# Patient Record
Sex: Male | Born: 1970 | Race: White | Hispanic: No | Marital: Single | Smoking: Never smoker
Health system: Southern US, Community
[De-identification: ages and names within clinical notes are randomized; demographics above are authoritative.]

## PROBLEM LIST (undated history)

## (undated) DIAGNOSIS — G4730 Sleep apnea, unspecified: Secondary | ICD-10-CM

## (undated) DIAGNOSIS — K219 Gastro-esophageal reflux disease without esophagitis: Secondary | ICD-10-CM

## (undated) DIAGNOSIS — K760 Fatty (change of) liver, not elsewhere classified: Secondary | ICD-10-CM

## (undated) DIAGNOSIS — I639 Cerebral infarction, unspecified: Secondary | ICD-10-CM

## (undated) DIAGNOSIS — F329 Major depressive disorder, single episode, unspecified: Secondary | ICD-10-CM

## (undated) DIAGNOSIS — M25559 Pain in unspecified hip: Secondary | ICD-10-CM

## (undated) DIAGNOSIS — M545 Low back pain, unspecified: Secondary | ICD-10-CM

## (undated) DIAGNOSIS — K635 Polyp of colon: Secondary | ICD-10-CM

## (undated) DIAGNOSIS — N183 Chronic kidney disease, stage 3 (moderate): Secondary | ICD-10-CM

## (undated) DIAGNOSIS — H409 Unspecified glaucoma: Secondary | ICD-10-CM

## (undated) DIAGNOSIS — Z9889 Other specified postprocedural states: Secondary | ICD-10-CM

## (undated) DIAGNOSIS — I499 Cardiac arrhythmia, unspecified: Secondary | ICD-10-CM

## (undated) DIAGNOSIS — F32A Depression, unspecified: Secondary | ICD-10-CM

## (undated) DIAGNOSIS — M2550 Pain in unspecified joint: Secondary | ICD-10-CM

## (undated) DIAGNOSIS — G8929 Other chronic pain: Secondary | ICD-10-CM

## (undated) DIAGNOSIS — Z5189 Encounter for other specified aftercare: Secondary | ICD-10-CM

## (undated) DIAGNOSIS — G709 Myoneural disorder, unspecified: Secondary | ICD-10-CM

## (undated) DIAGNOSIS — D649 Anemia, unspecified: Secondary | ICD-10-CM

## (undated) DIAGNOSIS — R7303 Prediabetes: Secondary | ICD-10-CM

## (undated) DIAGNOSIS — M1990 Unspecified osteoarthritis, unspecified site: Secondary | ICD-10-CM

## (undated) DIAGNOSIS — R112 Nausea with vomiting, unspecified: Secondary | ICD-10-CM

## (undated) DIAGNOSIS — I10 Essential (primary) hypertension: Secondary | ICD-10-CM

## (undated) DIAGNOSIS — Z8739 Personal history of other diseases of the musculoskeletal system and connective tissue: Secondary | ICD-10-CM

## (undated) DIAGNOSIS — R011 Cardiac murmur, unspecified: Secondary | ICD-10-CM

## (undated) DIAGNOSIS — E785 Hyperlipidemia, unspecified: Secondary | ICD-10-CM

## (undated) HISTORY — DX: Chronic kidney disease, stage 3 (moderate): N18.3

## (undated) HISTORY — PX: HERNIA REPAIR: SHX51

## (undated) HISTORY — DX: Depression, unspecified: F32.A

## (undated) HISTORY — DX: Encounter for other specified aftercare: Z51.89

## (undated) HISTORY — DX: Essential (primary) hypertension: I10

## (undated) HISTORY — PX: ESOPHAGOGASTRODUODENOSCOPY: SHX1529

## (undated) HISTORY — PX: CARDIOVERSION: SHX1299

## (undated) HISTORY — DX: Unspecified glaucoma: H40.9

## (undated) HISTORY — DX: Myoneural disorder, unspecified: G70.9

## (undated) HISTORY — DX: Major depressive disorder, single episode, unspecified: F32.9

## (undated) HISTORY — DX: Anemia, unspecified: D64.9

## (undated) HISTORY — DX: Prediabetes: R73.03

## (undated) HISTORY — DX: Cardiac murmur, unspecified: R01.1

## (undated) HISTORY — DX: Gastro-esophageal reflux disease without esophagitis: K21.9

## (undated) HISTORY — PX: TONSILLECTOMY: SUR1361

## (undated) HISTORY — PX: JOINT REPLACEMENT: SHX530

## (undated) HISTORY — DX: Cerebral infarction, unspecified: I63.9

## (undated) HISTORY — PX: COSMETIC SURGERY: SHX468

## (undated) HISTORY — DX: Pain in unspecified hip: M25.559

## (undated) HISTORY — DX: Polyp of colon: K63.5

## (undated) HISTORY — PX: LIPOSUCTION: SHX10

## (undated) HISTORY — DX: Hyperlipidemia, unspecified: E78.5

## (undated) HISTORY — DX: Unspecified osteoarthritis, unspecified site: M19.90

## (undated) HISTORY — DX: Pain in unspecified joint: M25.50

## (undated) SURGERY — PANNICULECTOMY
Anesthesia: General | Laterality: Bilateral

---

## 1997-10-10 ENCOUNTER — Emergency Department (HOSPITAL_COMMUNITY): Admission: EM | Admit: 1997-10-10 | Discharge: 1997-10-10 | Payer: Self-pay | Admitting: Emergency Medicine

## 1998-02-19 ENCOUNTER — Encounter: Admission: RE | Admit: 1998-02-19 | Discharge: 1998-05-20 | Payer: Self-pay | Admitting: Internal Medicine

## 1998-07-08 ENCOUNTER — Encounter: Admission: RE | Admit: 1998-07-08 | Discharge: 1998-10-06 | Payer: Self-pay | Admitting: Internal Medicine

## 1998-10-07 ENCOUNTER — Encounter: Admission: RE | Admit: 1998-10-07 | Discharge: 1999-01-05 | Payer: Self-pay | Admitting: Internal Medicine

## 2000-09-07 ENCOUNTER — Emergency Department (HOSPITAL_COMMUNITY): Admission: EM | Admit: 2000-09-07 | Discharge: 2000-09-07 | Payer: Self-pay | Admitting: *Deleted

## 2000-09-07 ENCOUNTER — Encounter: Payer: Self-pay | Admitting: *Deleted

## 2004-02-18 ENCOUNTER — Inpatient Hospital Stay (HOSPITAL_COMMUNITY): Admission: AD | Admit: 2004-02-18 | Discharge: 2004-02-25 | Payer: Self-pay | Admitting: Family Medicine

## 2004-05-25 DIAGNOSIS — I639 Cerebral infarction, unspecified: Secondary | ICD-10-CM

## 2004-05-25 HISTORY — DX: Cerebral infarction, unspecified: I63.9

## 2006-07-28 ENCOUNTER — Ambulatory Visit (HOSPITAL_COMMUNITY): Admission: RE | Admit: 2006-07-28 | Discharge: 2006-07-28 | Payer: Self-pay | Admitting: Family Medicine

## 2006-08-29 ENCOUNTER — Inpatient Hospital Stay (HOSPITAL_COMMUNITY): Admission: EM | Admit: 2006-08-29 | Discharge: 2006-09-02 | Payer: Self-pay | Admitting: Emergency Medicine

## 2006-08-30 ENCOUNTER — Ambulatory Visit: Payer: Self-pay | Admitting: Internal Medicine

## 2006-09-08 ENCOUNTER — Encounter: Admission: RE | Admit: 2006-09-08 | Discharge: 2006-09-08 | Payer: Self-pay | Admitting: Urology

## 2006-11-19 ENCOUNTER — Ambulatory Visit: Payer: Self-pay | Admitting: Gastroenterology

## 2007-02-23 ENCOUNTER — Ambulatory Visit (HOSPITAL_COMMUNITY): Admission: RE | Admit: 2007-02-23 | Discharge: 2007-02-23 | Payer: Self-pay | Admitting: Surgery

## 2007-02-23 ENCOUNTER — Ambulatory Visit: Payer: Self-pay | Admitting: Gastroenterology

## 2007-03-04 ENCOUNTER — Ambulatory Visit (HOSPITAL_COMMUNITY): Admission: RE | Admit: 2007-03-04 | Discharge: 2007-03-04 | Payer: Self-pay | Admitting: Gastroenterology

## 2007-03-04 ENCOUNTER — Ambulatory Visit: Payer: Self-pay | Admitting: Gastroenterology

## 2007-03-04 ENCOUNTER — Encounter: Payer: Self-pay | Admitting: Gastroenterology

## 2007-04-13 ENCOUNTER — Ambulatory Visit: Payer: Self-pay | Admitting: Gastroenterology

## 2007-04-27 ENCOUNTER — Ambulatory Visit: Payer: Self-pay | Admitting: Oncology

## 2007-07-04 ENCOUNTER — Ambulatory Visit: Payer: Self-pay | Admitting: Oncology

## 2007-07-28 ENCOUNTER — Ambulatory Visit: Payer: Self-pay | Admitting: Gastroenterology

## 2008-05-25 HISTORY — PX: BARIATRIC SURGERY: SHX1103

## 2008-08-03 DIAGNOSIS — T7840XA Allergy, unspecified, initial encounter: Secondary | ICD-10-CM | POA: Insufficient documentation

## 2008-08-03 DIAGNOSIS — I6350 Cerebral infarction due to unspecified occlusion or stenosis of unspecified cerebral artery: Secondary | ICD-10-CM

## 2008-08-03 DIAGNOSIS — G4730 Sleep apnea, unspecified: Secondary | ICD-10-CM | POA: Insufficient documentation

## 2008-08-03 DIAGNOSIS — I10 Essential (primary) hypertension: Secondary | ICD-10-CM | POA: Insufficient documentation

## 2008-08-03 DIAGNOSIS — E785 Hyperlipidemia, unspecified: Secondary | ICD-10-CM

## 2008-08-03 DIAGNOSIS — D126 Benign neoplasm of colon, unspecified: Secondary | ICD-10-CM

## 2008-08-03 DIAGNOSIS — E119 Type 2 diabetes mellitus without complications: Secondary | ICD-10-CM

## 2008-08-03 DIAGNOSIS — K449 Diaphragmatic hernia without obstruction or gangrene: Secondary | ICD-10-CM | POA: Insufficient documentation

## 2008-08-03 DIAGNOSIS — D649 Anemia, unspecified: Secondary | ICD-10-CM | POA: Insufficient documentation

## 2008-08-03 HISTORY — DX: Diaphragmatic hernia without obstruction or gangrene: K44.9

## 2008-08-03 HISTORY — DX: Allergy, unspecified, initial encounter: T78.40XA

## 2008-08-03 HISTORY — DX: Benign neoplasm of colon, unspecified: D12.6

## 2008-08-03 HISTORY — DX: Cerebral infarction due to unspecified occlusion or stenosis of unspecified cerebral artery: I63.50

## 2008-08-30 ENCOUNTER — Ambulatory Visit (HOSPITAL_COMMUNITY): Admission: RE | Admit: 2008-08-30 | Discharge: 2008-08-30 | Payer: Self-pay | Admitting: Family Medicine

## 2010-01-09 ENCOUNTER — Ambulatory Visit (HOSPITAL_COMMUNITY): Admission: RE | Admit: 2010-01-09 | Discharge: 2010-01-09 | Payer: Self-pay | Admitting: Family Medicine

## 2010-03-12 ENCOUNTER — Encounter (INDEPENDENT_AMBULATORY_CARE_PROVIDER_SITE_OTHER): Payer: Self-pay | Admitting: *Deleted

## 2010-06-15 ENCOUNTER — Encounter: Payer: Self-pay | Admitting: Family Medicine

## 2010-06-15 ENCOUNTER — Encounter: Payer: Self-pay | Admitting: Internal Medicine

## 2010-06-26 NOTE — Letter (Signed)
Summary: Recall Colonoscopy/Endoscopy, Change to Office Visit  [facility]  [address]   [place], [place] [address]   Phone: [phone]  Fax: [phone]      March 12, 2010   LYNX GOODRICH [address] [place], [place]  [address] March 13, 1971   Dear Mr. Knarr,   According to our records, it is time for you to schedule a Colonoscopy/Endoscopy. However, after reviewing your medical record, we recommend an office visit in order to determine your need for a repeat procedure.  Please call [phone] at your convenience to schedule an office visit. If you have any questions or concerns, please feel free to contact our office.   Sincerely,   Ave Filter  [facility] Ph: [phone]   Fax: [phone]

## 2010-10-07 NOTE — Consult Note (Signed)
[name], [name]                ACCOUNT NO.:  [id number]   MEDICAL RECORD NO.:  [id number]          PATIENT TYPE:  AMB   LOCATION:  DAY                           FACILITY:  [facility]   PHYSICIAN:  Kassie Mends, M.D.      DATE OF BIRTH:  1970/09/10   DATE OF CONSULTATION:  02/23/2007  DATE OF DISCHARGE:                                 CONSULTATION   PROBLEM LIST:  1. Normocytic anemia.  2. Hypertension.  3. Hyperlipidemia.  4. Stroke in April of 2008.  5. Diabetes.  6. Sleep apnea.  7. Morbid obesity.  8. Allergies.   SUBJECTIVE:  [name] is a 40 year old male who was initially seen in  June of 2008 because his hemoglobin was 12.3 in March of 2008.  He had  heme-negative stool at a time.  He needed an evaluation for his anemia  but had sleep apnea which had not been adequately managed.  He denied  any active bleeding at the time.  He presents today, and his CPAP has  been titrated.  He has a full face mask.  He is scheduled for obesity  surgery approximate 3 months from now.  He denies any bleeding or black  tarry stools.  Iron makes his stools green.  He has been on a diet for 1  month and has to lose 35 pounds.   MEDICATIONS:  1. Lisinopril 20 mg daily.  2. Percocet.  3  Iron.  1. Plendil.  2. NPH 25 units every morning.  3. Metoprolol.  4. HCTZ.  5. Zocor.  6. Benicar.  7. Aspirin 81 mg daily.  8. Spirolactone 25 mg b.i.d.  9. Lantus 25 units at night.  10.Gabapentin.  11.Metformin 1 gram twice daily.  12.Actos 45 mg daily.  13.Multivitamin.   OBJECTIVE:  VITAL SIGNS:  Weight reportedly 470 pounds.  Height 5 feet 7  inches.  Temperature 97.9, blood pressure 120/70, pulse 64.  GENERAL:  He is in no apparent distress, alert and oriented x4.  LUNGS:  Clear to auscultation bilaterally.  CARDIOVASCULAR:  Regular rhythm, no murmur, normal S1-S2.  ABDOMEN:  Bowel sounds present, soft, nontender, nondistended.  No  rebound or guarding.  Obese.   LABORATORY DATA:   From July of 2008:  Hemoglobin 13.9, platelets 230,  ferritin 26.   ASSESSMENT:  [name] is a 40 year old male with a normocytic anemia.  His anemia seems to have responded to oral iron therapy.  He may have  early iron-deficiency anemia.  He was also noted to have a creatinine  1.49 in March of 2008.   Thank you for allowing me to see [name] in consultation.  My  recommendations follow.   RECOMMENDATIONS:  1. Will obtain recent labs drawn in Dr. Samul Dada office.  2. He will be scheduled for a colonoscopy followed by an EGD to      evaluate his anemia.  He is asked to bring his BiPAP machine with      him.  We will need to connect it to oxygen for his sedation because  he is on room air at home.  He will be given the OsmoPrep.  I did      stress the need for drinking plenty of fluids.  He should drink      plenty of fluids to keep his urine light yellow.  3. He may follow up as needed with me.      Kassie Mends, M.D.  Electronically Signed     SM/MEDQ  D:  02/23/2007  T:  02/24/2007  Job:  [id number]   cc:   Jeoffrey Massed, MD  Fax: [phone]

## 2010-10-07 NOTE — Assessment & Plan Note (Signed)
[name], CODNER                 CHART#:  [id number]   DATE:  04/13/2007                       DOB:  06-03-1970   CHIEF COMPLAINT:  Follow up multiple colonic adenomatous polyps on  recent colonoscopy/normocytic anemia/gastritis.   SUBJECTIVE:  The patient is a 40 year old morbidly obese Caucasian male.  He underwent EGD and colonoscopy by Dr. Cira Servant on 03/04/07 with history  of normocytic anemia and low ferritin with Hemoccult-negative stool.  He  was found to have 7 colonic polyps which were removed from his colon  which were tubular adenomas.  He had chronic gastritis with H. pylori  seen on biopsy.  He had a 1 cm hiatal hernia.  He is status post H.  pylori treatment and he has done well.  He was treated with Flagyl,  Prevacid, and amoxicillin.  He is being seen by the [facility].  He has had 25-pound weight loss thus far.  He is  preparing for gastric bypass.  He denies any abdominal pain at this  time, denies any nausea or vomiting.  He does occasionally have loose  stools with urgency and denies any rectal bleeding or melena.  Overall,  he feels well.   CURRENT MEDICATIONS:  See list from 04/13/07.   ALLERGIES:  No known drug allergies.   OBJECTIVE:  VITAL SIGNS:  Weight stated 455 pounds, height 67 inches,  temp 97.3, blood pressure 128/78, pulse 64.  GENERAL:  He is a morbidly obese Caucasian male who is alert, oriented,  pleasant, cooperative.  No acute distress.  HEENT:  Sclerae clear, nonicteric.  Conjunctivae pink, clear.  Oropharynx pink and moist without any lesions.  CHEST/HEART:  Regular rate and rhythm.  Normal S1, S2.  ABDOMEN:  Protuberant.  Massively obese with positive bowel sounds x4.  No bruits auscultated.  Soft, nontender, nondistended, without palpable  mass or hepatosplenomegaly.  Exam is extremely limited given patient's  body habitus.  EXTREMITIES:  With trace lower extremity pretibial edema bilaterally.   ASSESSMENT:  1. Multiple adenomatous polyps removed on recent colonoscopy (7):  He      is going to need further evaluation for genetic testing for      familial adenomatous polyposis.  2. Morbid obesity.  He is being seen by the bariatric clinic in      [place], [place].  3. Helicobacter pylori gastritis status post triple drug therapy.  4. Normocytic anemia most related to significant polyposis.   PLAN:  1. Next colonoscopy October 2011 or sooner if he has problems.  2. Check hemoglobin and ferritin.  If this is normal, he can      discontinue iron.  3. Continue Prevacid 30 mg daily.  4. He is reminded that his first-degree relatives are going to need a      colonoscopy at age 15 and every 5 years.  5. OPV with Dr. Cira Servant in 6 months.       Lorenza Burton, N.P.  Electronically Signed     Kassie Mends, M.D.  Electronically Signed    KJ/MEDQ  D:  04/14/2007  T:  04/15/2007  Job:  [id number]   cc:   Jeoffrey Massed, MD

## 2010-10-07 NOTE — Assessment & Plan Note (Signed)
[name], [name]                 CHART#:  [id number]   DATE:  07/28/2007                       DOB:  1971/01/18   DATE OF VISIT:  07/28/2007   REFERRING PHYSICIAN:  Jeoffrey Massed, MEDICATIONS   PROBLEM LIST:  1. Colonoscopy in October of 2008, which revealed multiple adenomatous      polyps without high grade dysplasia.  2. Normocytic anemia with biopsies of the duodenum negative for celiac      sprue.  3. Morbid obesity.  4. History of H. pylori gastritis.  5. Hiatal hernia.  6. Hypertension.  7. Hyperlipidemia.  8. History of CVA.  9. Diabetes.  10.Sleep apnea.  11.Allergies.   SUBJECTIVE:  The patient is a 40 year old male who presents as a return  patient visit.  He did see the genetic counselor, but the test that he  needs to confirm familial adenomatous polyposis costs $4000 and Medicaid  is not willing to pay for it.  He does not have $4000 to pay for the  test.  He denies any blood in his stool.  His strict protein diet made  him have loose stool.  He is still undergoing evaluation at the  Bariatric Clinic.   OBJECTIVE:  Height 5 feet 7 inches.  Temperature 97.6, blood pressure  122/70, pulse 64.   ASSESSMENT:  The patient is a 40 year old male who had normocytic anemia  and was found to have multiple polyps on colonoscopy in October of 2008.  His last hemoglobin that we have on record is November of 2008, and was  13.6.  He is no longer on iron.  Thank you for allowing me to see this  patient in consultation.  My recommendations follow.   RECOMMENDATIONS:  1. We will obtain labs drawn from Dr. Samul Dada office in 2009.  He      should have a recent hemoglobin and hematocrit.  2. He has a follow up appointment to see me in 12 months.  3. Screening colonoscopy in three years unless the tests for familial      adenomatous polyposis      confirms the diagnosis.  His first degree relatives should have a      screening colonoscopy at age 42 and then every 5  years.       Kassie Mends, M.D.  Electronically Signed     SM/MEDQ  D:  07/28/2007  T:  07/28/2007  Job:  [id number]   cc:   Jeoffrey Massed, MD

## 2010-10-07 NOTE — Assessment & Plan Note (Signed)
[name], [name]                 CHART#:  [id number]   DATE:  11/19/2006                       DOB:  [birth year]   REFERRING PHYSICIAN:  Nicoletta Ba.   REASON FOR CONSULTATION:  Anemia.   HPI:  [name] is a 40 year old male who reports no evidence of overt  bleeding.  He was sent by [name] because a CBC was drawn in August  of 2007, and his hemoglobin was 12.3.  He had 3 Hemoccult cards drawn  and they were negative in October of 2007.  His CBC was drawn again in  March of 2008, and his hemoglobin, again, was noted to be 12.2.  His MCV  was 83.2.  He has a BUN of 33 and a creatinine of 1.49 associated with  his CBC.  The iron, TIBC, and the ferritin that were drawn along with  this are not available to me today.  The patient has a significant  medical history of being morbidly obese.  He is currently 470 pounds.  He has been as heavy as 573 pounds. He denies any blood in his stool or  vomiting of blood.  He is taking iron pills so his stools are black, but  he has not had black stool that looks like tar.  He does not really have  a bowel movement every day.  He states does not eat meat every day but  he eats meat.  He denies any problems swallowing, heartburn, vomiting,  or abdominal pain.  He does not have any problems with diarrhea or blood  in his urine.  He states his weight has gone up and gone down.  He  denies the use of any aspirins, BCs, or Goody powders or other  antiinflammatory drugs.  He sometimes uses Tylenol or Percocet for  musculoskeletal pain.   PAST MEDICAL HISTORY:  1. Hypertension.  2. Hyperlipidemia.  3. Stroke in April of 2008.  4. Diabetes.  5. Sleep apnea but he has no machine.  6. Allergies.   PAST SURGICAL HISTORY:  None.   ALLERGIES:  No known drug allergies.   MEDICATIONS:  Obtained from Dr. Samul Dada note of May 2008.  1. Lisinopril 20 mg tablets daily.  2. Percocet as needed.  3. Iron 325 mg twice daily.  4. Plendil 10 mg  daily.  5. NPH 30 units q.a.m.  6. Metoprolol 150 mg twice daily.  7. Hydrochlorothiazide 25 mg daily.  8. Zocor 40 mg daily.  9. Benicar 40 mg daily.  10.Aspirin 81 mg daily.  11.Aldactone 25 mg b.i.d.  12.Lantus 30 units nightly.  13.Neurontin 300 mg nightly.  14.Metformin 1 g twice daily.  15.Actos 45 mg daily.   FAMILY HISTORY:  He has no family history of colon cancer or colon  polyps.   SOCIAL HISTORY:  He lives with his grandmother for the past 8 years.  He  is single.  He is disabled.  He denies any tobacco or alcohol use.   REVIEW OF SYSTEMS:  As per the HPI, otherwise all systems are negative.   PHYSICAL EXAMINATION:  VITAL SIGNS:  He is 470 pounds, height 5 feet 7  inches, BMI 73.6 (morbidly obese), temperature 98, blood pressure  142/80, pulse 72.  GENERAL:  He is in no apparent distress.  Alert and oriented x4.  HEENT  EXAM:  Atraumatic, normocephalic.  Pupils equal and reactive to light.  MOUTH:  No oral lesions.  Posterior pharynx without erythema or  exudate.NECK:  Full range of motion and no lymphadenopathy.  LUNGS:  Clear to auscultation bilaterally.CARDIOVASCULAR EXAM:  Regular  rhythm, no murmur, normal S1 and S2.  ABDOMEN:  Bowel sounds are present, soft, nontender, nondistended.  No  rebound or guarding.  Unable to assess for hepatosplenomegaly due to the  size of his abdomen.EXTREMITIES:  Have 2+ edema bilaterally.  RECTAL  EXAM:  Unable to palpate any masses.  Prostate within normal limits, no  masses.  Stool heme negative and soft in the vault.   LABORATORY DATA:  ALT 13, AST 15, alk phos 86, total bili 0.7, albumin  3.3, calcium 8.3.  Hemoglobin A1c 6.9 in January of 2008.   ASSESSMENT:  [name] is a 40 year old male with morbid obesity and  normocytic anemia with heme-negative stools.  He also has sleep apnea  and it has not been fully evaluated and appropriately managed.  Anemia  in a young male warrants endoscopic evaluation.  However, the  2  comorbidities that make the patient's endoscopy high risk include his  morbid obesity and his sleep apnea.   Thank you for allowing me to see the patient in consultation.  My  recommendations follow.   RECOMMENDATIONS:  1. I spent 20 minutes talking with the patient about what the next      best step would be for him, and I outlined a plan.  The first step      should be for him to attend his obesity seminar to consider      bariatric surgery.  He would certainly need an endoscopic      evaluation prior to having bariatric surgery, and the facility that      would perform this surgery would be better equipped to perform a      colonoscopy.  The next step, following his attendance of the      seminar, should be for him to see to Dr. Gerilyn Pilgrim so that he can      obtain his sleep apnea machine so that he can have appropriate      settings, and when he does receive sedation, his airway can be      appropriately managed from an oxygenation and a ventilation      standpoint.  Once these 2 steps are complete, then we can make the      decision as to where he receives his colonoscopy.  My first      inclination is that the patient would be best served having his      colonoscopy at a tertiary care center where, should he have      problems with hypoventilation and need to be intubated, he is at an      institution in which he has the highest level of care of anesthesia      available.  I will discuss the benefits versus the risk of      conscious sedation versus propofol with my colleagues at [facility] in a patient who has      the patient's comorbidities and discuss this with the patient.  2. Will check a CBC and a ferritin.  3. He has a return patient visit to see me in 2 months.       Kassie Mends, M.D.  Electronically Signed     SM/MEDQ  D:  11/19/2006  T:  11/20/2006  Job:  [id number]   cc:   Jeoffrey Massed, MD

## 2010-10-07 NOTE — Op Note (Signed)
NAMEZEB, [name]                ACCOUNT NO.:  [id number]   MEDICAL RECORD NO.:  [id number]          PATIENT TYPE:  AMB   LOCATION:  DAY                           FACILITY:  [facility]   PHYSICIAN:  Kassie Mends, M.D.      DATE OF BIRTH:  [birth year]   DATE OF PROCEDURE:  03/04/2007  DATE OF DISCHARGE:                               OPERATIVE REPORT   REFERRING PHYSICIAN:  Earley Favor, M.D.   PROCEDURE:  1. Colonoscopy with cold-forceps polypectomy and snare-cautery      polypectomy.  2. Esophagogastroduodenoscopy with cold-forceps biopsy.   INDICATION FOR EXAMINATION:  [name] is a 40 year old male who  presented with a normocytic anemia with a low ferritin.  He had heme  negative stool.  He was placed on iron in June 2008 when his hemoglobin  was 12.3.  His hemoglobin was rechecked in October, and hemoglobin was  13.9.  His endoscopy is being performed to evaluate the etiology of his  anemia.   FINDINGS:  1. Seven colon polyps removed.  He had a sessile ascending colon polyp      which is approximately 3 mm that was removed via cold forceps.  He      had a 3-mm sessile transverse colon polyp removed via cold forceps.      He had two 6-mm sessile colon polyps removed via cold forceps.  He      had 2 sessile 6-mm sigmoid colon polyps removed via snare cautery.      He had 1 pedunculated rectosigmoid polyp removed via snare cautery.      Otherwise no masses, inflammatory changes, diverticula or AVMs.  No      internal hemorrhoids.  2. Normal esophagus without evidence of Barrett's erosion or      ulceration.  3. A 1-cm hiatal hernia.  Diffuse erythema of the body in the antrum      with nodularity at the pylorus and occasional erosions.  Biopsies      obtained to evaluate for H pylori gastritis.  4. Normal duodenal bulb and second portion of the duodenum.   DIAGNOSIS:  1. [name] normocytic anemia which responded to iron was likely      secondary to multiple polyps  seen throughout his colon.  2. Small hiatal hernia and gastritis.   RECOMMENDATIONS:  1. Will call [name] with the results of his biopsies.  If his      polyps are adenomatous, then his first-degree relatives should have      a screening colonoscopy at age 62 and then every 5 years.  2. Due to the number of polyps seen in [name] colon, he should      have a screening colonoscopy in 3 years.  He also may require      genetic testing if it is confirmed that he has multiple adenomatous      polyps at the age of 30.  3. No aspirin, NSAIDs or anticoagulation for 7 days.  4. He should avoid gastric irritants and be on a high-fiber diet.  He  is given a handout on gastritis, high-fiber diet, polyps and      gastric irritants.  5. Would recheck his hemoglobin and his ferritin within the next      month, and if there are within normal range, he likely can stop his      iron.   MEDICATIONS:  1. Demerol 75 mg IV.  2. Versed 7 mg IV.   PROCEDURE TECHNIQUE:  Physical exam was performed, and informed consent  was obtained from the patient after explaining benefits, risks and  alternatives to the procedure.  The patient was connected to the monitor  and placed in the left lateral position.  Continuous oxygen was provided  by nasal cannula and IV medicine administered through an indwelling  cannula.  After administration of sedation and rectal exam, the  patient's rectum was intubated, and the scope was advanced under direct  visualization to the cecum.  The scope was removed slowly by carefully  examining the color, texture, anatomy and integrity of the mucosa on the  way out.   After the colonoscopy, the patient's esophagus was intubated with a  diagnostic gastroscope and advanced under direct visualization to the  second portion of the duodenum.  The scope was removed slowly by  carefully examining the color, texture, anatomy and integrity of mucosa  on the way out.  The  patient was recovered in endoscopy and discharged  home in satisfactory condition.      Kassie Mends, M.D.  Electronically Signed     SM/MEDQ  D:  03/04/2007  T:  03/05/2007  Job:  [id number]   cc:   Jeoffrey Massed, MD  Fax: [phone]

## 2010-10-10 NOTE — Consult Note (Signed)
[name], [name] NO.:  [id number]   MEDICAL RECORD NO.:  [id number]          PATIENT TYPE:  INP   LOCATION:  A337                          FACILITY:  [facility]   PHYSICIAN:  Sherrie Mustache, M.D.    DATE OF BIRTH:  04-08-1971   DATE OF CONSULTATION:  02/21/2004  DATE OF DISCHARGE:                                   CONSULTATION   REASON FOR CONSULTATION:  History of uncontrolled diabetes.   HISTORY OF PRESENT ILLNESS:  The patient is a 40 year old male with a  history of severe obesity and diabetes who had to be admitted because of  having uncontrolled blood sugar values.  The patient is taking insulin NPH  and also oral agent at home for controlling his sugars;  however, his sugar  has been running high lately.  The patient had been found also to have  peripheral edema and has been also admitted for better control of his  peripheral edema through giving fluid p.o.   SOCIAL HISTORY:  Negative for smoking, negative for alcohol abuse, negative  for drug abuse.  The patient is single, does not have any children.   PHYSICAL EXAMINATION:  GENERAL:  The patient is an obese male who does not  appear to be in acute distress.  He is not jaundiced.  VITAL SIGNS:  Temperature 97.6, heart rate 90, respiratory rate 20, blood  pressure 144/90.  HEENT:  Pupils equal, round, reactive to light.  EOMI.  _______________.  Ears and nose unremarkable.  NECK:  Supple, negative JVD, negative thyromegaly, negative lymphadenopathy,  negative bruits.  LUNGS:  Clear and no rhonchi.  HEART:  No murmurs, no gallops.  ABDOMEN:  Soft, nontender, bowel sounds are present.  EXTREMITIES:  Negative for peripheral edema, clubbing, or cyanosis.  Reflexes symmetric and normal.  Peripheral pulses are present.  He does not  have any tremor about each hand.   ASSESSMENT AND PLAN:  The patient's blood sugar has been running high and  during the hospital course which he has been on insulin, Lantus  insulin NPH,  Glucotrol XL, and Glucophage, the sugar is running about 200 to 230.  He is  on a 2000 calorie ADA diet.  I would discontinue his insulin NPH and  increase his insulin Lantus at bedtime to 35 units.  I would also increase  his Glucotrol XL to 15 mg, 10 mg in the morning and 5 mg in the evening.  I  also would consider to start him on __________ 50 mg b.i.d. with meals.  The  goal should be to keep his insulin injection once a day and also the dose as  low as possible.  I would follow the blood sugar and if needed to consider  to start him also on Actos and if needed adjust his Lantus insulin.    JF/MEDQ  D:  02/21/2004  T:  02/22/2004  Job:  [id number]

## 2010-10-10 NOTE — H&P (Signed)
[name], [name]                ACCOUNT NO.:  [id number]   MEDICAL RECORD NO.:  [id number]          PATIENT TYPE:  INP   LOCATION:  A212                          FACILITY:  [facility]   PHYSICIAN:  Margaretmary Dys, M.D.DATE OF BIRTH:  February 17, 1971   DATE OF ADMISSION:  08/29/2006  DATE OF DISCHARGE:  LH                              HISTORY & PHYSICAL   +   PRIMARY CARE PHYSICIAN:  Jeoffrey Massed, MD   ADMITTING DIAGNOSES:  1. Acute cerebrovascular accident with left hemiparesis.  2. Poorly controlled type 2 diabetes.  3. Morbid obesity.  4. History of dyslipidemia.  5. History of hypertension.   CHIEF COMPLAINT:  Weakness on the left side of the body, since 3:00 a.m.  today.   HISTORY OF PRESENT ILLNESS:  [name] is a 40 year old Caucasian male  who presented to the emergency room today with an acute onset of left-  sided weakness and numbness this morning.  He presented about 3 hours  after the episode.  Patient said he woke up to go to the bathroom at  about 3:00 a.m., and he was falling to his left side.   He felt really wobbly on his feet and called out to his grandmother.  His grandmother was able to help sit him on the couch, before calling  911.  Patient did not fall. Immediately, he passed out.  He denies any  preceding headache.  He said before he went to bed, he was doing fairly  well with no concerns at all.  Patient denies any prior history of  stroke or similar symptoms.  He has had no fevers or chills, no nausea  or vomiting, no abdominal pain.  He has had breakfast this morning and  had no trouble with his meals.   Patient weighs close to 500 pounds and could not have a CAT scan of his  head, because of weight limitations on the table.  Clinical evaluation  revealed evidence of left hemiparesis, and patient clearly does have  metabolic syndrome and all of the risk factors.   Patient is admitted for further evaluation and treatment.   REVIEW OF  SYSTEMS:  Really unremarkable.  No chest pain, no shortness of  breath.  He just reports left-sided weakness.  He has been eating fairly  well over the past 3 years with no recent hospitalization, except for  poorly controlled diabetes back in September of 2005.  He [facility] to be  [name].   PAST MEDICAL HISTORY:  1. Hypertension.  2. Morbid obesity.  3. Type 2 diabetes.  4. Dyslipidemia.   ALLERGIES:  NO KNOWN DRUG ALLERGIES.   MEDICATIONS:  He is on:  1. Felodipine 10 mg p.o. once a day.  2. Ferrous sulfate 325 mg p.o. daily.  3. Hydrochlorothiazide 25 mg p.o. daily.  4. Lantus insulin 30 units subcu q.h.s.  5. NPH insulin 30 units subcu q.a.m.  6. Avapro 300 mg p.o. daily.  7. Metformin 1000 p.o. b.i.d.  8. Lopressor 100 mg p.o. b.i.d.  9. Multivitamins 1 tablets p.o. daily.  10.Actos  45 mg p.o. daily.  11.Zocor 20 mg p.o. daily.  12.Spironolactone 25 mg p.o. b.i.d.   FAMILY HISTORY:  Positive for mother doing fairly well with respiratory  illness.  Father died at age 48, secondary to complications of diabetes.  His grandmother is alive, and he currently lives with her.   SOCIAL HISTORY:  Patient is single, never married, has no children.  He  is a lifelong nonsmoker, does not drink alcohol and denies any illicit  drug use.   PHYSICAL EXAMINATION:  GENERAL:  Conscious, alert, comfortable, obese  male in no acute distress.  VITAL SIGNS:  On arrival on the floor was 173/87, pulse of 71,  respirations was 22, temperature was 98.4.  Oxygen saturation was 95% on  room air, and his weight was 216 kg.  HEENT:  Normocephalic, atraumatic.  Oral mucosa was moist with no  exudates.  NECK:  Supple, no JVD.  LUNGS:  Reduced air entry bilaterally, no crackles or wheezing.  HEART:  S1, S2, regular.  No S3, S4, gallops or rubs.  ABDOMEN:  Soft, nontender.  Bowel sounds were positive, was grossly  obese, though.  EXTREMITIES:  Chronic stasis dermatitis with no  erythema noted.  CNS:  Exam shows conscious, alert, well-oriented in time, place and  person.  He had no obvious facial palsy.  Patient clearly does have left-  sided weakness with positive pronator drift on the left side.  Power is  above three to four on the left side.  Sensation was intact.  Reflexes  were also intact.  Difficult to appreciate any carotid bruits.   Gag reflex was intact.   LABORATORY/DIAGNOSTIC DATA:  A 12-lead EKG shows normal sinus rhythm, as  patient has a right bundle branch block.  The duration is not known.   His white blood cell count was 10.9, hemoglobin 13.1, hematocrit 37.5,  platelet count was 247 with no left shift.  Sodium was 138, potassium  4.3, chloride of 104, CO2 28.  Glucose was 130, BUN 25.  Creatinine was  1.26.  Calcium is 9.0.   ASSESSMENT/PLAN:  [name] is a 40 year old morbidly obese Caucasian  male who presents with symptoms consistent with an acute CVA of the  right hemisphere, likely an internal capsule stroke consistent with  history of hypertension.  Difficult to say at this time if it is  thrombotic, but I suspect that is probably what it is.  Plan at this  time is to resume all his other medications.  Patient has no trouble  swallowing, so will put him on clear liquids for today, advance diet  tomorrow.  I will hold off aspirin on him or anti-platelet therapy,  until he is seen by the neurologist.  There is some concern about  possibly exacerbating the stroke, if it is indeed hemorrhagic,  especially with our lack of being able to do a CT scan of his head.  I  will put him on sequential compression device at this time.  We will  obtain carotid Dopplers of his neck, echocardiogram and also some  additional blood work.  Patient is  young.  He is 40 years old, but he  does have multiple risk factors, clearly raising a concern for possible  hypercoagulable state, but I do think most of this is a result of his metabolic syndrome.   Again, I will await final neurology input.  Will  also obtain PT/OT eval and rehab eval.      Margaretmary Dys, M.D.  Electronically Signed     AM/MEDQ  D:  08/29/2006  T:  08/29/2006  Job:  [id number]

## 2010-10-10 NOTE — Discharge Summary (Signed)
[name], [name] NO.:  [id number]   MEDICAL RECORD NO.:  [id number]          PATIENT TYPE:  INP   LOCATION:  A212                          FACILITY:  [facility]   PHYSICIAN:  Osvaldo Shipper, MD     DATE OF BIRTH:  1971/04/12   DATE OF ADMISSION:  08/29/2006  DATE OF DISCHARGE:  LH                               DISCHARGE SUMMARY   PRIMARY CARE PHYSICIAN:  Jeoffrey Massed, MD   DISCHARGE DIAGNOSES:  1. Left hemiparesis likely from right cerebral hemispheric stroke.  2. 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  3. Morbid obesity with weigh over 475 pounds.  4. Insulin dependent diabetes.  5. Poorly controlled hypertension.  6. Dyslipidemia.   Please review H&P dictated by Dr. Sherle Poe for details regarding  patient's present illness.   BRIEF HOSPITAL COURSE:  Briefly, this is a 40 year old Caucasian male  who is morbidly obese with a weight of over 470 pounds who also has  diabetes requiring insulin and hypertension and dyslipidemia who  presented to the ED with complaint of left sided weakness, acute onset.  The patient, because of his weight, could not undergo any kind of  neurologica imaging.  Clinically, he was found to have weakness on the  left side upper and lower extremity.  He was determined to have an acute  CVA.  The patient was seen by neurologist, Dr. Gerilyn Pilgrim, was agreed with  the assessment.  Since we could not get any images, we were not sure if  this was an ischemic process or a hemorrhagic process, although, his  risk factors definitely points to a thrombotic and ischemic process.  Because of the lack of imaging, antiplatelets were deferred, and  according to Dr. Gerilyn Pilgrim, these can be started in two weeks from the  day of his admission.  He did have an echo which did not show any  significant abnormalities as the left ventricle EF was 55-60%.  No  evidence for any embolic process was noticed.  He underwent  carotid  Doppler which did not show any significant stenosis.  Chest x-ray did  not show any active lung disease, it just showed cardiomegaly.  He  underwent blood work that showed elevated ESR.  His LDL was 150 and HDL  25, triglycerides were 257. TSH was normal.  Homocysteine was within  normal range.  RPR was negative.  Unfortunately, an HbA1c was not done.  Basically, this gentleman is young.  He is only 7, but he definitely  has multiple risk factors to have a stroke.  Hypercoagulable state was  considered.  However, workup will be deferred for now because of the  risk factors.  Dr. Gerilyn Pilgrim has agreed to the above.   OTHER MEDICAL ISSUES:  1. Diabetes remained stable.  He was continued on Lantus and NPH, put      on a sliding scale and his blood sugars were reasonably controlled.      His blood pressure, however, remained elevated most times.  We have      increased the dose of his metoprolol  and have added an ACE      inhibitor and hopefully in a week or two his blood pressure should      be adequately controlled.  2. Morbid obesity.  I am not sure if this patient is a candidate for      gastric bypass procedures nor if this has been considered in the      past.  I will defer this issue to patient's PMD.  3. The patient was seen by PT/OT during this hospital stay.  He was      also evaluated by the representatives from [facility], and      he was found to be a good candidate for rehabilitation.  Hence, as      soon as they have a bed available, the patient will be discharged.      We also attempted to see if he could have any neural imaging done      at any other site.  Apparently, there is one case in [place],      [facility] which can do a CT scan with the weight      up to 500 pounds, but there is no place that can do an MRI.  I will      set up a CT scan in a couple of weeks for this gentleman over      there.  It will be preferable for him to  have an MRI again.  I      think the PMD can set this up either at [facility] or at [facility]      where they may have scanners which can accommodate his weight.   DISCHARGE MEDICATIONS:  1. Enteric coated aspirin 81 mg once daily to be started on  April 20,      no earlier than that.  2. Zocor 40 mg q.p.m.  3. Felodipine 10 mg once daily.  4. Ferrous sulfate 325 mg once daily.  5. HCTZ 25 mg once daily.  6. Aldactone 25 mg b.i.d.  7. Actos 45 mg daily.  8. Lantus insulin 30 units q.h.s.  9. Insulin NPH Novolin N 30 units in the morning.  10.Benicar 40 mg once daily.  11.Multivitamin one tablet daily.  12.Metformin 1000 mg p.o. b.i.d.  13.Lisinopril 20 mg daily.  14.Metoprolol 150 mg b.i.d.   FOLLOWUP:  1. Follow up with PMD Dr. Santiago Bumpers after he is discharged from      [facility].  2. The patient will be set up for a non-contrast CT of his head at      [facility].  This will be set up in about two      weeks time as an outpatient (phone [phone]).  3. PMD to consider scheduling an MRI either at [facility] or [place] at      some point.  4. Patient to follow up with Dr.  Gerilyn Pilgrim a few weeks after discharge      from [facility].   DISCHARGE INSTRUCTIONS:  1. Diet:  He needs to have a modified carbohydrate diet, also, heart      healthy.  2. [facility]:  [facility].   CONSULTATIONS:  The patient was seen by Dr. Gerilyn Pilgrim during this  hospital stay.   He will not undergo any procedures except for a midline placement for IV  access.   Imagine studies discussed above.   Time of discharge, 40 minutes.  Osvaldo Shipper, MD  Electronically Signed     GK/MEDQ  D:  08/31/2006  T:  08/31/2006  Job:  [id number]   cc:   Jeoffrey Massed, MD  Fax: [phone]   Kofi A. Gerilyn Pilgrim, M.D.  Fax: [phone]

## 2010-10-10 NOTE — Discharge Summary (Signed)
[name], [name] NO.:  [id number]   MEDICAL RECORD NO.:  [id number]          PATIENT TYPE:  INP   LOCATION:  A337                          FACILITY:  [facility]   PHYSICIAN:  Annia Friendly. Loleta Chance, M.D.   DATE OF BIRTH:  January 20, 1971   DATE OF ADMISSION:  02/18/2004  DATE OF DISCHARGE:  10/03/2005LH                                 DISCHARGE SUMMARY   HISTORY OF PRESENT ILLNESS:  The patient was a 40 year old single, disabled  white male from [place], [place].  Chief complaints were general  malaise, increased thirst and increased frequency in urination.  The patient  had been experiencing these symptoms over 6 months, off and on.  He denied  abdominal pain, nausea, vomiting, leg ulcers, syncope, weight loss and  blurred vision.  The patient did complaint of intermittent numbness of legs.   PAST MEDICAL HISTORY:  1.  Type 2 diabetes mellitus on insulin.  2.  Obesity.  3.  Hypertension.  4.  Hyperlipidemia.   ALLERGIES:  No known drug allergies.   HABITS:  Negative for tobacco, ethanol or street drugs.   FAMILY HISTORY:  Mother living at age 57 with history of respiratory  illness.  Father deceased at age 47 secondary to complications with  diabetes.  Two brothers living age 45, good health; age 66, good health.   HOSPITAL COURSE:  #1 - UNCONTROLLED DIABETES MELLITUS.  Vitals on admission  were as follows:  Blood pressure 125/69, respirations 24, pulse 86,  temperature 98.0.  General appearance revealed a young, middle aged,  severely overweight, medium height, white male in no apparent respiratory  distress.  Lungs were clear.  Heart revealed audible S1, S2 without murmur.  Rhythm was regular and rage within normal limits.  Abdomen was grossly  obese, soft, nontender in all four quadrants.  No palpable masses or  organomegaly.  Left lower quadrant positive for confluent linear  erythematous area.  Extremities demonstrated crepitus with flexion and  extension of knees.  Tibia was positive pitting edema bilateral (+1).  Feet  were also positive for pitting edema bilaterally (+1).  Neurologically, the  patient appeared to be intact.  Significant labs on admission were as  follows:  White count 9.1, hemoglobin 13.9, hematocrit 40.9, platelets  248,000.  Sodium 137, potassium 4.1, chloride 106, CO2 29, glucose 242, BUN  11, creatinine 1.2.  Hemoglobin A1C 9.8.  The patient was admitted to the  medical floor.  She was started on insulin Humulin N 30 units subcu before  breakfast and 20 units every evening before supper, Glucotrol XL 10 mg p.o.  every morning before breakfast, Glucophage 500 mg two tablets t.i.d. with  meals, Accu-Check 0700, 1600, 2200 daily.  Dietician consult:  Dietician  monitored patient while giving the insulin and other supportive measures.  Blood sugar began to show some improvement during this hospitalization.  Blood sugar on the evening of October 2, at 1600, was 27.  Blood sugar on  the evening of October 3, at 2200, was 147.  Unfortunately, fasting blood  sugar was 210 on the morning  of her discharge.  The patient's insulin had  been changed.  At the time of discharge, he is on Lantus insulin 50 units  subcu every evening at 10 p.m. and Humulin N 50 units subcu every morning at  breakfast.  Metformin 1000 mg p.o. b.i.d. with meals and glipizide 10 mg  p.o. before breakfast everyday and Glyset 50 mg one tablet t.i.d. with  meals.  The patient did not experience an episode of diaphoretic spell,  dizziness or confusion.  He remained alert and oriented to person, place and  time.  The patient appeared to be compliant to diet during this  hospitalization.  Dietician recommended 2000 calorie meal, plan for weight  loss.  The patient will be seen in the office approximately in two days  after discharge.  The patient also was seen by the endocrinologist during  this hospitalization, Dr. __________ who had been managing  the patient with  me pertaining to his diabetes.  Dr. __________ will also see the patient as  outpatient pertaining to management of his diabetes, also.   #2 - GROSS OBESITY.  Weight on admission was 501 pounds.  Weight on February 23, 2004, was 492 pounds.  Weight on February 24, 2004, was 487 pounds.  It  should be noted that edema of legs was treated with Aldactone 25 mg p.o.  b.i.d.  Last electrolytes were on February 24, 2004, and revealed the  following:  Sodium 133, potassium 4.5, chloride 96, CO2 33, BUN 14,  creatinine 1.4.   #3 - HYPERLIPIDEMIA.  Fasting lipid profile on February 19, 2004, 0605,  revealed the following:  Total cholesterol 235 mg/dL, triglyceride 981  mg/dL, HDL 32 mg/dL, VLDL 75 mg/dL, LDL 191 mg/dL.  During this  hospitalization, the patient was started on Tricor 67 mg p.o. daily.  In  addition to calorie restriction, the patient was put on low-sodium and low-  cholesterol diet.  Liver function tests will be repeated at least once a  year.  The patient is not complaining of myalgia.   #4 - DIABETIC NEUROPATHY.  The patient complaining of some numbness of  extremities.  Started on Gabapentin 300 mg p.o. q.h.s.  He will be followed  as outpatient response to this medication.   #5 - DERMATITIS OF ABDOMEN.  The patient was treated with hydrocortisone 1%  topical cream to rash daily.  Rash appeared to be resolving at time of  discharge.   DISCHARGE INSTRUCTIONS:  1.  Diet:  A 2200 calorie low-salt, low-cholesterol.  2.  Activity:  No restrictions.   MEDICATIONS:  1.  Spironolactone 25 mg p.o. b.i.d.  2.  Glipizide 10 mg p.o. every morning before breakfast.  3.  Metformin 1000 mg p.o. b.i.d. with meals.  4.  Humulin N 50 units subcu every morning at breakfast.  5.  Lantus insulin 50 units subcu every evening at 10 p.m.  6.  Tricor 48 mg p.o. every day.  7.  Gabapentin 300 mg one tablet every bedtime. 8.  Loratadine 10 mg one tablet daily for allergies.  9.   Glyset 50 mg one tablet t.i.d. with meals.  10. Hydrocortisone 1% topical cream to rash on abdomen every day.  11. Accu-Check 7 a.m., 11 a.m., 5 p.m. and 10 p.m. daily.   FOLLOWUP:  Follow up in the office with Dr. Loleta Chance on February 27, 2004.  Follow  up with Dr. _________ at the office on February 28, 2004.   FINAL PRIMARY DIAGNOSES:  1.  Uncontrolled diabetes mellitus.  2.  Gross obesity.  3.  Mixed hyperlipidemia.  4.  Dermatitis of abdomen.  5.  Diabetic neuropathy.      GKH/MEDQ  D:  02/27/2004  T:  02/27/2004  Job:  [id number]

## 2010-10-10 NOTE — Consult Note (Signed)
NAMESALVATOR, SEPPALA                ACCOUNT NO.:  [id number]   MEDICAL RECORD NO.:  [id number]          PATIENT TYPE:  INP   LOCATION:  A212                          FACILITY:  [facility]   PHYSICIAN:  Kofi A. Gerilyn Pilgrim, M.D. DATE OF BIRTH:  [birth year]   DATE OF CONSULTATION:  DATE OF DISCHARGE:                                 CONSULTATION   HISTORY:  This is a 40 year old while male who presents with the acute  onset of left sided heaviness and numbness.  The patient apparently fell  but did not loss of consciousness.  There is no associated headaches or  dysarthria.  The patient was not able to have imaging done because of  him being over 500 pounds which is over the CT scan limit.  The patient  was previously on aspirin and this was discontinued.   PAST MEDICAL HISTORY:  Hypertension, type 2 diabetes, morbid obesity,  dyslipidemia.   ALLERGIES:  NO KNOWN DRUG ALLERGIES.   ADMISSION MEDICATIONS:  Felodipine, ferrous sulfate,  hydrochlorothiazide, insulin, Avapro, metformin, Lipitor, multivitamins,  Actos, Zocor and spironolactone.   FAMILY HISTORY:  Significant for respiratory illnesses, diabetes.   SOCIAL HISTORY:  Patient is single, has never been married.  No  children.  He is a life long smoker.  No alcohol or illicit drug use.  He resides with his mother.   PHYSICAL EXAMINATION:  GENERAL:  Shows a morbid obese, pleasant male.  VITAL SIGNS:  Temperature 98.5, pulse 74, respiration 22, blood pressure  153/65.  HEENT: Neck is supple.  Head is normocephalic atraumatic.  ABDOMEN:  Obese.  Soft.  EXTREMITIES:  No edema or varicosities.  NEUROLOGIC:  Patient is awake and alert.  He converses well.  Speech is  normal.  Language and cognition are also intact.  CRANIAL NERVE:  Pupils are 4 mm and are reactive.  Extraocular movements  are intact.  Visual fields are full.  Facial muscle strength is  symmetric.  Motor examination shows left upper extremity weakness about  4/5 with  pronator drift.  He has at least antigravity strength involving  the legs, although the exact strength is not clear.  The left leg is a  little weaker than the right.  He has normal strength in the right upper  extremity.  Bulk and tone seems normal throughout.  Coordination shows  mild dysmetria of the left upper extremity commensurate with the  associated weakness.  Reflexes are normal.  Sensation normal to light  touch and temperature bilaterally.   WBC 10.9, hemoglobin 13, platelet count of 247, sodium 138, potassium  4.3, chloride 104, CO2 of 28, glucose 130, BUN 25, creatinine 1.2.   ASSESSMENT:  Likely subcortical ischemic infarct involving the right  hemisphere.  However we cannot rule out hemorrhagic infarct.  Additionally the patient may have a demyelinating process such as  multiple sclerosis given his age or any other mass or lesion.   RECOMMENDATIONS:  1. Agree with a carotid and echo.  2. Aspirin which should be started about 2 weeks from now.  3. At some point and time the patient should  have imaging if possible      at another facility that can accommodate the patient's weight.      Kofi A. Gerilyn Pilgrim, M.D.  Electronically Signed     KAD/MEDQ  D:  08/30/2006  T:  08/30/2006  Job:  [id number]

## 2010-10-10 NOTE — Procedures (Signed)
NAMEKAMRAN, COKER NO.:  [id number]   MEDICAL RECORD NO.:  [id number]          PATIENT TYPE:  INP   LOCATION:  A212                          FACILITY:  [facility]   PHYSICIAN:  Pricilla Riffle, MD, FACCDATE OF BIRTH:  08-30-1970   DATE OF PROCEDURE:  08/30/2006  DATE OF DISCHARGE:                                ECHOCARDIOGRAM   REFERRING PHYSICIAN:  __________ [facility].   TEST INDICATION:  A 40 year old with history of CVA and history of  hypertension.   A 2-D ECHO WITH ECHO DOPPLER:  Left ventricle is normal in size with an  end-diastolic dimension of 43 mm the interventricular septum and  posterior wall are thickened though difficult to measure because of  acoustic window.   Left atrium is normal.  Right atrium, right ventricle are normal.   The aortic valve is mildly thickened.  No stenosis.  No insufficiency.  Mitral valve is mildly thickened with no significant insufficiency.  Pulmonic valve is mildly thickened with no insufficiency.  Tricuspid  valve is normal with no insufficiency.   LVEF is normal with an LVEF of approximately 55-60%.  RVEF is normal.  No pericardial effusion is seen.  Sinus Dietrich Pates, MD of [facility] an.   ADDENDUM:  In comparison to echo from March 2008, no significant change.      Pricilla Riffle, MD, [facility]  Electronically Signed     PVR/MEDQ  D:  08/30/2006  T:  08/30/2006  Job:  [id number]

## 2010-10-10 NOTE — H&P (Signed)
NAMEFERNIE, GRIMM NO.:  [id number]   MEDICAL RECORD NO.:  [id number]          PATIENT TYPE:  INP   LOCATION:  A337                          FACILITY:  [facility]   PHYSICIAN:  Annia Friendly. Loleta Chance, M.D.   DATE OF BIRTH:  [birth year]   DATE OF ADMISSION:  02/18/2004  DATE OF DISCHARGE:  LH                                HISTORY & PHYSICAL   The patient is a 40 year old, single, disabled, white male from [place],  [place].   CHIEF COMPLAINT:  General malaise, increased thirst, and increased frequency  of urination.   HISTORY OF PRESENT ILLNESS:  The patient has been experiencing these  symptoms over six months off and on.  He denies abdominal pain, nausea,  vomiting, leg ulcers, syncope, weight loss, and blurred vision.  The patient  complains of intermittent numbness of legs   Medical history is positive for type 2 diabetes mellitus with insulin,  obesity, hypertension, hyperlipidemia.  Medical history is negative for  tuberculosis, cancer, cystic fibrosis, asthma, seizure disorder.   ALLERGIES:  The patient is not allergic to any medications.  Prescribed  medications on admission are:  1.  Humulin N 30 units subcutaneously every morning, Humulin N 20 units      subcutaneously every evening.  2.  Glucotrol XL 10 mg p.o. every morning before breakfast.  3.  Glucophage 500 mg 2 tablets twice a day with meals.  4.  TriCor 67 mg p.o. every day.  5.  Enalapril 5 mg p.o. every day.   HABITS:  Negative for tobacco, ethanol, and street drugs.   Sexually transmitted disease history is negative for gonorrhea, syphilis,  herpes, and HIV infection.   PAST MEDICAL HISTORY:  1.  Positive for hospitalization for ear surgery at age 60 at [facility].  2.  Hospitalization at age 32 at [facility] for tonsillectomy.  3.  Hospitalization at age 55 for a boil at [facility].   FAMILY HISTORY:  Mother living at age 68 with history of  respiratory  illness.  Father deceased at age 92 secondary to complications of diabetes.  Two brothers are living, ages 74in good health, and 7 in good health.   REVIEW OF SYSTEMS:  Positive for Chronic swelling of legs, intermittent  bilateral leg pain, nasal congestion, stiffness of knees, shortness of  breath on moderate exertion, episodic numbness of legs.  Review of Systems  negative for epistaxis, diaphoretic spell, bleeding gums, hemoptysis,  hematemesis, dysuria, gross hematuria, constipation, diarrhea, melena,  dysphagia, etc.   PHYSICAL EXAMINATION:  GENERAL:  Appearance of severely overweight, medium  height, young, middle-aged white male who appeared not to feel well.  VITAL SIGNS:  Temperature 98.0, pulse 86, respirations 24, blood pressure  125/69.  HEENT:  Head is normocephalic.  Ears:  Normal auricles.  External canals  patent. Tympanic membranes pearly grey.  Eyes:  Lids negative ptosis,  sclerae white.  Pupils round, equal, and reactive to light.  Extraocular  movements intact.  Nose negative for discharge.  Mouth:  No oral  lesions.  Dentition good.  Posterior pharynx benign.  NECK:  Negative lymphadenopathy, thyromegaly.  Supraclavicular space: No  palpable nodes.  LUNGS:  Clear.  HEART:  Audible S1 and S2 without murmur.  Regular rate and rhythm.  ABDOMEN:  Grossly obese.  Soft, nontender in all four quadrants.  No  palpable masses or organomegaly.  Left lower quadrant positive for confluent  linea, erythematous area.  GENITALIA:  Penis uncircumcised.  No penile lesion or discharge.  Scrotum  palpable testicle without nodule or tenderness.  RECTAL:  No external lesions. Digital exam: Prostate mildly enlarged and  smooth.  No rectal vault masses.  Stool guaiac pending.  EXTREMITIES:  Knees positive crepitus with flexion and extension  bilaterally.  Tibia positive pitting edema bilaterally (+1).  Feet:  Positive pitting edema bilaterally at +1.  NEUROLOGIC:  Alert  and oriented to person, place, and time.  Cranial nerves  II-XII appeared intact.   LABORATORY AND X-RAY DATA:  White count 9.1, hemoglobin 13.9, hematocrit  40.9, platelets 248,000.  Sodium 137, potassium 4.1, chloride 106, CO2 29,  glucose 242, BUN 11, creatinine 1.2.  Accu-Chek in the office on the day of  admission 307.  Accu-Chek on February 07, 2004, ion the office was 369.  Accu-Chek on Oct 16, 2003, 394 in office.   IMPRESSION:  Out-of-control diabetes mellitus.   SECONDARY DIAGNOSES:  1.  Morbid obesity.  2.  Hypertension.  3.  Hyperlipidemia.   PLAN:  1.  Dietician consult.  2.  Heparin lock  3.  __________ floor.  4.  Aldactone 25 mg p.o. b.i.d.  5.  Humulin N 30 units subcutaneously every morning at breakfast and 20 unit      subcutaneously every evening at supper.  6.  Glucotrol LX 10 mg p.o. every morning before breakfast.  7.  Glucophage 500 mg 2 tablets b.i.d. with meals.  8.  TriCor 60 mg p.o. every day.  9.  Accu-Chek 0700, 1600, and 2200.  10. Labs:  Urinalysis, TSH, hemoglobin A1C, fasting lipid profile, chest x-      ray.  11. Case worker consult.  12. Diet:  Carbohydrate 2000 calorie, low-cholesterol, 4 g sodium.  13. Allegra D 1 tablet b.i.d.  14. Neurontin 100 mg p.o. every bedtime.      GKH/MEDQ  D:  02/18/2004  T:  02/18/2004  Job:  [id number]

## 2010-10-10 NOTE — Discharge Summary (Signed)
[name], [name] NO.:  [id number]   MEDICAL RECORD NO.:  [id number]          PATIENT TYPE:  INP   LOCATION:  A212                          FACILITY:  [facility]   PHYSICIAN:  Osvaldo Shipper, MD     DATE OF BIRTH:  29-Nov-1970   DATE OF ADMISSION:  08/29/2006  DATE OF DISCHARGE:  04/10/2008LH                               DISCHARGE SUMMARY   ADDENDUM:  Please note, this is an addendum to a discharge summary  dictated on August 31, 2006.   Please refer the discharge summary dictated earlier for details  regarding patient's hospital stay.   The original plan was for the patient to go to [facility] to get  rehabilitation for his CVA.  Unfortunately, for two days we were  awaiting a bed to open up at [facility].  In the two days, patient  has been receiving physical therapy here in the hospital and he has  progressed along so much so that he now is no longer thought to be a  candidate for [employer].  Hence, patient will be discharged home.  He will continue to get physical therapy, occupational therapy at home.  We will also be provided a bariatric hospital bed, bedside commode and a  walker for him at home.  This has been discussed in great detail by our  case manager and myself with the patient and the family and they are  agreeable.   Once again, he will need to have a CAT scan done and actually this has  been scheduled for him on September 07, 2006, at [facility] at 10:45 a.m.  Since patient is morbidly obese, no imaging  studies could be done in this hospital.  He might also benefit from an  MRI at some point, but I think his PMD can further pursue this.  No  changes are being made to any of his discharge medications as dictated  in the earlier summary.  He has been stable for the past two days.  His  blood work also has been unremarkable with no changes.   Today patient is feeling well, does not offer any complaints.  He  just  had a few questions about his CAT scan and transportation and other  social issues.  They were all answered to his satisfaction.  We are  attempting to arrange transportation for him to go to [place], [place], for the CAT scan and Dr. Jaci Lazier, our social worker, is going  to arrange that.  His vital signs have been stable overnight.  His blood  pressure is much improved now that  his medications have been adjusted.  Examination otherwise was unchanged.   ASSESSMENT/PLAN:  Remain unchanged as well. He is progressing along  nicely with respect to rehabilitation with regards to his left-sided  weakness.  We will arrange follow-up with Dr. Gerilyn Pilgrim in about three  weeks or so.  We told the patient that he will not be able to drive any vehicle until  he follows up with Dr. Gerilyn Pilgrim.  Other instructions  as mentioned  earlier.   TOTAL TIME OF DISCHARGE:  40 minutes.      Osvaldo Shipper, MD  Electronically Signed     GK/MEDQ  D:  09/02/2006  T:  09/02/2006  Job:  [id number]   cc:   Darleen Crocker A. Gerilyn Pilgrim, M.D.  Fax: [phone]   Jeoffrey Massed, MD  Fax: [phone]

## 2010-10-10 NOTE — Procedures (Signed)
NAMECUTTER, PASSEY NO.:  [id number]   MEDICAL RECORD NO.:  [id number]          PATIENT TYPE:  OUT   LOCATION:  RAD                           FACILITY:  [facility]   PHYSICIAN:  Vonna Kotyk R. Jacinto Halim, MD       DATE OF BIRTH:  [birth year]   DATE OF PROCEDURE:  07/28/2006  DATE OF DISCHARGE:                                ECHOCARDIOGRAM   PROCEDURE PERFORMED:  Doppler echocardiogram.   INDICATIONS:  Hypertension, shortness of breath.   AORTIC ROOT:  3.1 cm.   ATRIUM:  4.1 cm.   SEPTUM:  1.8 cm.   POSTERIOR WALL:  1.6 cm.   LV DIASTOLIC DIMENSION:  4.7 cm.   LV SYSTOLIC DIMENSION:  3.7 cm.   EJECTION FRACTION:  55-60%.   The aortic root is not well visualized and appears to be normal-sized.   LEFT VENTRICLE:  The ventricle shows moderate concentric left  ventricular hypertrophy.  There is normal segmental wall motion.  Ejection fraction is 55-60%.   LEFT ATRIUM:  The left atrium shows mild left atrial enlargement.   RIGHT ATRIUM:  The right atrium is normal.   PERICARDIUM:  The pericardium is normal.   AORTIC VALVE:  The aortic valve is grossly normal.  It is not well  visualized.   MITRAL VALVE:  The mitral valve is grossly normal.  There is no  significant mitral valve regurgitation.   PULMONARY VALVE:  The pulmonary artery is not well visualized.   TRICUSPID VALVE:  Normal.   FINDINGS:  1. Patient with poor echo window.  The left ventricular systolic      function is preserved with an ejection fraction of 55-60% with      moderate concentric left ventricular hypertrophy.  2. Mild left atrial enlargement.      Cristy Hilts. Jacinto Halim, MD  Electronically Signed     JRG/MEDQ  D:  07/28/2006  T:  07/29/2006  Job:  [id number]   cc:   Jeoffrey Massed, MD  Fax: [phone]

## 2011-07-03 ENCOUNTER — Telehealth (HOSPITAL_COMMUNITY): Payer: Self-pay | Admitting: Dietician

## 2011-07-03 NOTE — Telephone Encounter (Signed)
Pt requests follow-up per his surgeon, Dr. Fortunato Curling in [place]. He reports that he has been seen here "a long time ago". Appointment set-up for 07/06/11 at 1:30 PM.

## 2011-07-06 ENCOUNTER — Encounter (HOSPITAL_COMMUNITY): Payer: Self-pay | Admitting: Dietician

## 2011-07-06 NOTE — Progress Notes (Signed)
Follow-Up Outpatient Nutrition Note Date: 07/06/2011  Time: 1:30 PM  Nutrition Assessment:  Current weight: Weight: 262 lb (118.842 kg)  BMI: Body mass index is 41.03 kg/(m^2).   [name] presents for follow-up. He reports that his bariatric surgeon, Dr. Cameron Proud ([facility] in [place]) requested RD follow-up at last visit for him "to get back on track". Pt reports he underwent bariatric surgery 2.5 years ago at 600#. Since then, he has lost down to as low as 245#. Pt is concerned that is he is now gaining weight again. He reports "my brain is racking trying to figure out why". He denies dumping syndrome, nausea, vomiting, or diarrhea. He is taking his protein, testosterone, calcium, calcium with vitamin D, iron, and aspirin supplements. He reports a stress level of 5-6, citing his health as his main stressor. He admits it is difficult for him to follow his diet during the holidays and when his grandmother buys junk foods and prepares fried foods. He admits to excessive snacking. He has been a member of the [facility] for 3 years; he reports he goes for 2.5 hours daily, lifting weights. He also tries to ride his stationery bike daily for 20-30 minutes, but admits it can be hard when his legs, knees, and back ache. He only drinks water, milk, and juice. He tries to drink his protein drinks and protein bars at least every other day, but admits that they get very expensive.   Labs: CMP  No results found for this basename: na, k, cl, co2, glucose, bun, creatinine, calcium, prot, albumin, ast, alt, alkphos, bilitot, gfrnonaa, gfraa    Lipid Panel  No results found for this basename: chol, trig, hdl, cholhdl, vldl, ldlcalc     No results found for this basename: HGBA1C   No results found for this basename: GLUF, MICROALBUR, LDLCALC, CREATININE     Diet recall: 5 AM: 2 g;asses of water; 6-6:30: protein bar; Snack: energy drink; Snack: bottle of water with protein powder, peanuts; Snack:  protein shake; Lunch: tuna and crackers; Dinner: pork chop OR chicken OR fish, green beans OR corn; HS snack: protein bar OR fruit cocktail  Nutrition Diagnosis: Involuntary weight gain r/t excessive energy intake due to snacking AEB 6.9% weight gain x 6 months.   Nutrition Intervention: Nutrition rx: 1800 kcal NAS, no added sugar diet; 3 meals and 1 HS snack daily; eat protein foods first; 30-60 minutes physical activity daily; low calorie beverages only  Education/ counseling provided: Discussed calorie content of food commonly eaten and excessive snacking related to weight gain. Discussed calorie and sugar content in protein bars and shakes. Discussed lower calorie, high protein food alternatives. Discussed keeping a food diary to track calorie intake; showed pt functionality of MyFitnessPal app. Discussed importance of exercise to maintain weight; discussed different exercises to try, such as water aerobics. Provided bariatric surgery handout.   Understanding/Motivation/ Ability to follow recommendations: Expect fair compliance.   Monitoring and Evaluation: Goals for next visit: 1) 1-2# weight loss per week; 2) 30-60 minutes of cardio exercise 5 times per week   Recommendations: 1) Keep food diary (ex. My FitnessPal); 2) Try high protein meats (ex. Tuna) for snacks in place of protein bar; 3) Eat protein foods first; 4) Try water aerobics class  F/U: 2 months.   Melody Haver, RD, LDN Date:07/06/2011 Time: 1:30 PM

## 2011-09-03 ENCOUNTER — Telehealth (HOSPITAL_COMMUNITY): Payer: Self-pay | Admitting: Dietician

## 2011-09-03 NOTE — Telephone Encounter (Signed)
Sent appointment confirmation letter to pt home via [name] Mail. Appointment scheduled for 09/07/11 at 11:00 AM.

## 2011-09-07 ENCOUNTER — Encounter (HOSPITAL_COMMUNITY): Payer: Self-pay | Admitting: Dietician

## 2011-09-07 NOTE — Progress Notes (Signed)
Pt was a no-show for appointment scheduled for 09/07/2011 at 11:00 PM. Sent letter to pt home notifying pt of no-show and requesting rescheduling appointment.

## 2011-09-14 ENCOUNTER — Encounter (HOSPITAL_COMMUNITY): Payer: Self-pay | Admitting: Dietician

## 2011-09-14 NOTE — Progress Notes (Signed)
Letters to pt home sent on 09/09/11 and 09/10/11 were returned to sender. [employer]PS reports "not deliverable as addressed, unable to forward".

## 2011-09-17 ENCOUNTER — Ambulatory Visit (HOSPITAL_COMMUNITY): Payer: Self-pay | Admitting: Physical Therapy

## 2011-09-25 ENCOUNTER — Ambulatory Visit (HOSPITAL_COMMUNITY)
Admission: RE | Admit: 2011-09-25 | Discharge: 2011-09-25 | Disposition: A | Discharge status: Home / Self Care | Payer: Medicaid Other | Source: Ambulatory Visit | Attending: Anesthesiology | Admitting: Anesthesiology

## 2011-09-25 DIAGNOSIS — M6281 Muscle weakness (generalized): Secondary | ICD-10-CM | POA: Insufficient documentation

## 2011-09-25 DIAGNOSIS — I10 Essential (primary) hypertension: Secondary | ICD-10-CM | POA: Insufficient documentation

## 2011-09-25 DIAGNOSIS — M545 Low back pain, unspecified: Secondary | ICD-10-CM | POA: Insufficient documentation

## 2011-09-25 DIAGNOSIS — IMO0001 Reserved for inherently not codable concepts without codable children: Secondary | ICD-10-CM | POA: Insufficient documentation

## 2011-09-25 DIAGNOSIS — E119 Type 2 diabetes mellitus without complications: Secondary | ICD-10-CM | POA: Insufficient documentation

## 2011-09-25 DIAGNOSIS — E785 Hyperlipidemia, unspecified: Secondary | ICD-10-CM | POA: Insufficient documentation

## 2011-09-30 ENCOUNTER — Ambulatory Visit (HOSPITAL_COMMUNITY)
Admission: RE | Admit: 2011-09-30 | Discharge: 2011-09-30 | Disposition: A | Discharge status: Home / Self Care | Payer: Medicaid Other | Source: Ambulatory Visit | Attending: Anesthesiology | Admitting: Anesthesiology

## 2011-09-30 NOTE — Progress Notes (Addendum)
Physical Therapy Treatment Patient Details  Name: [name] MRN: [id number] Date of Birth: [birth year]  Today's Date: 09/30/2011 Time: 1520-1559 PT Time Calculation (min): 39 min Visit#: 2  of 4   Charges: Therex x 33' Education (self care) x 5'  Authorization: MEDICAID  Authorization Time Period: 09/28/2011 - 10/26/2011   Authorization Visit#: 1  of 3    Subjective: Symptoms/Limitations Symptoms: Pt states that he tries do do his home exercises averyday. Pain Assessment Currently in Pain?: Yes Pain Score:   4 Pain Location: Back Pain Orientation: Lower   Exercise/Treatments Stretches Active Hamstring Stretch: 3 reps;30 seconds;Limitations Active Hamstring Stretch Limitations: With rope Single Knee to Chest Stretch: 3 reps;30 seconds Standing Other Standing Lumbar Exercises: SLS R5" L7" max of 3 Supine Ab Set: 10 reps;5 seconds Bent Knee Raise: 10 reps Bridge: 10 reps Other Supine Lumbar Exercises: Pelvic floor contractions 10x5" Sidelying Clam: 5 reps;Limitations Clam Limitations: 10" holds Hip Abduction: 10 reps  Physical Therapy Assessment and Plan PT Assessment and Plan Clinical Impression Statement: Pt completes therex well with minimal need for cueing. Pt requires multimodal cueing to facilitate correct form with functional squats. Pt is without complaint throughout session. HEP given for stretches and hip abduction. PT Plan: Begin heel/toe raise, hip isometrics and heel squeezes next session.    Problem List Patient Active Problem List  Diagnoses  . COLONIC POLYPS, ADENOMATOUS  . DIABETES MELLITUS  . HYPERLIPIDEMIA  . MORBID OBESITY  . ANEMIA, NORMOCYTIC  . HYPERTENSION  . CVA  . HIATAL HERNIA  . SLEEP APNEA  . LOOSE STOOLS  . ALLERGY  . HELICOBACTER PYLORI GASTRITIS, HX OF    PT - End of Session Activity Tolerance: Patient tolerated treatment well General Behavior During Session: [facility] for tasks performed Cognition: [facility] for tasks  performed   Seth Bake, PTA 09/30/2011, 4:56 PM

## 2011-09-30 NOTE — Progress Notes (Signed)
[facility] ATTNClaris Gower [address] [place], [place] [address] Phone: [phone] Fax: [phone]     INITIAL EVALUATION  Physical Therapy     Patient Name: [name] Date Of Birth: 12/26/1970  Guardian Name: N/A Treatment ICD-9 Code: 7213  Address: [address] Date of Evaluation: 09/25/2011  [place], [place] [address] Requested Dates of Service: 09/28/2011 - 10/26/2011       Therapy History: No known therapy for this problem  Reason For Referral: Recipient has a new injury, disease or condition  Prior Level of Function: Independent/Modified Independent with all ADLs (OT/PT) or Audition, Communication, Voice and/or Swallowing Skills (ST/AUD)  Additional Medical History: Pt is a 41 year old who is referred to PT secondary to myosfascial pain to his low back region. he has a history of a L sided stroke (2007) which motivated him to start to lose weight. He was at 600lbs when he had the stroke and reports he is now about 250lbs (through surgery and exercise he has lost 350lbs). His c/co is his back pain limits him from fully exercising at the gym. he is able to comfortably: walk: 10-15 minutes; Stand: 20 minutes (needs to walk around to decrease pain); sit: no longer than an hour if he is not in his recliner. He also reports radicular pain to BLE about 50% of his day. He also reports he tends to stumble often when his L leg gives way a few times a week. He reports increased fear of falling and injuring himself. 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 He enjoys going to the gym, hanging out with his friends, going shopping (currently has to lean on shopping cart to decrease pain). Pain: current 4/10 OBJECTIVE: Gower Sign w/return to stand Posterior lean with heel walking, forward flexion with toe walking Decreased stance on LLE during gait Increased skin fold to R lumbar Impaired breathing mechanics through the chest  significant extra epidermis from abdominal surgery. Scar healed well. Significant decrease in hip internal rotation and hip adduction. Pt unable to place legs together (may be due to significant history of obesity with increased abd. pannus)  Prematurity: N/A  Severity Level: N/A       Treatment Goals:  1. Goal: Pt will be independent with a HEP  Baseline: Education only  Duration: 2 Week(s)  2. Goal: Pt will report pain less than 3/10 for 75% of his day and score less than a 30% on the ODI for improved QOL  Baseline: Current pain 4/10 Oswestry Disability Index (ODI) 36%  Duration: 4 Week(s)  3. Goal: Pt will improve his lumbar AROM to WNL without an increase in pain with lumbar extension, R SB or L quadrant extension.  Baseline: Lumbar: Flexion: decreased 10% w/gower sign Extension: decreased 25% (pain 3-4/10) R quad ext: WNL L quadrant extension: WNL w/increased pain R SB: WNL, w/pain 2/10  Duration: 4 Week(s)  4. Goal: Pt will improve his L and R SLS to 20 sec on static surface  Baseline: Tandem: L: 7 sec; R: 7 sec Single Leg Stance (SLS): R: 2, L: 3  Duration: 2 Week(s)  5. Goal: Pt will improve lumbar AROM and LE flexibility in order to demo proper mechanics with squatting and lifting activities.  Baseline: improper mechanics with squatting and impaired coordination.  Duration: 4 Week(s)  Goal: Pt will improve R and L LE strength to WNL and improve core strength  to multifidus and TrA to 10/10 on Hodges Scale.  Baseline: Right, Left hip flexion: 4/5, 4/5 w/trunk ext hip extension: 3+/5, 3+/5 Hip Abduction: 4/5, 3+/5 Knee Flexion: 4/5, 4/5 Knee Extension: 5/5, 5/5 Core Strength: multifidus: 0/10, TrA: 6/10  Duration: 2 Week(s)         Treatment Frequency/Duration:  2x/week for 4 weeks  Units per visit: N/A    Additional Information: Assessment: Pt is referred to PT secondary to myofascial restrictions to his LB region. After examination it was found that he has current impairments  including increased pain, decreased ROM, decreased core and LE strength, impaired muscle core coordination and impaired balance which is limiting his ability to continue with his leisure activities including working out to continue on his weight loss goals. He will benefit from skilled OP PT in order to address above impairments in order to maximize funcitonal ability. Plan: Ther-ex, Pt ed, Ther-act, NMR, functional training, manual techniques and modalitites PRN, HEP.           Therapist Signature  Date Physician Signature  Date    Annett Fabian       Therapist Name  Physician Name   Refer to the Review Status page for current case status

## 2011-10-07 ENCOUNTER — Ambulatory Visit (HOSPITAL_COMMUNITY): Payer: Medicaid Other | Admitting: *Deleted

## 2011-10-08 ENCOUNTER — Ambulatory Visit (HOSPITAL_COMMUNITY)
Admission: RE | Admit: 2011-10-08 | Discharge: 2011-10-08 | Disposition: A | Discharge status: Home / Self Care | Payer: Medicaid Other | Source: Ambulatory Visit | Attending: Anesthesiology | Admitting: Anesthesiology

## 2011-10-08 NOTE — Progress Notes (Signed)
Physical Therapy Treatment Patient Details  Name: [name] MRN: [id number] Date of Birth: 1970-11-15  Today's Date: 10/08/2011 Time: 1520-1555 PT Time Calculation (min): 35 min Visit#: 3  of 4   Authorization: MEDICAID  Authorization Time Period: 09/28/2011 - 10/26/2011   Authorization Visit#: 2  of 3   Charges:  therex 33' Subjective:  Pt. Reports his pain is about a 4/10 today.  Reports compliance with HEP   Exercise/Treatments Stretches Active Hamstring Stretch: 3 reps;30 seconds;Limitations Active Hamstring Stretch Limitations: With rope Single Knee to Chest Stretch: 3 reps;30 seconds Standing Heel Raises: 15 reps;Limitations Heel Raises Limitations: toeraises 15 reps Other Standing Lumbar Exercises: SLS R:7" L7" max of 3 Supine Ab Set: 10 reps;5 seconds Bent Knee Raise: 10 reps Bridge: 10 reps Isometric Hip Flexion: 10 reps;5 seconds Other Supine Lumbar Exercises: Pelvic floor contractions 10x5" Other Supine Lumbar Exercises: hip adduction isometric 10X5 reps Sidelying Clam: 5 reps;Limitations Clam Limitations: 10" holds Hip Abduction: 10 reps     Physical Therapy Assessment and Plan PT Assessment and Plan Clinical Impression Statement: Pt. ordered TENS unit per evaluating therapist.  Discussed with pt. and agreed to try therapy another visit before deciding whether or not he wanted one.  Added isometric hip exercises for flexion/adduction as well as prone heel squeeze and heel/toe raises all without difficulty.  Pt. with poor mechanics transferring sit to supine/supine to sit.  Pt. PT Plan: begin lunges and squats; discuss TENS as next visit will be last visit.     Problem List Patient Active Problem List  Diagnoses  . COLONIC POLYPS, ADENOMATOUS  . DIABETES MELLITUS  . HYPERLIPIDEMIA  . MORBID OBESITY  . ANEMIA, NORMOCYTIC  . HYPERTENSION  . CVA  . HIATAL HERNIA  . SLEEP APNEA  . LOOSE STOOLS  . ALLERGY  . HELICOBACTER PYLORI GASTRITIS, HX OF    PT  - End of Session Activity Tolerance: Patient tolerated treatment well General Behavior During Session: [facility] for tasks performed Cognition: [facility] for tasks performed   Yamira Papa B. Bascom Levels, PTA 10/08/2011, 4:21 PM

## 2011-10-14 ENCOUNTER — Ambulatory Visit (HOSPITAL_COMMUNITY): Payer: Medicaid Other | Admitting: Physical Therapy

## 2011-10-22 ENCOUNTER — Ambulatory Visit (HOSPITAL_COMMUNITY)
Admission: RE | Admit: 2011-10-22 | Discharge: 2011-10-22 | Disposition: A | Discharge status: Home / Self Care | Payer: Medicaid Other | Source: Ambulatory Visit | Attending: Anesthesiology | Admitting: Anesthesiology

## 2011-10-22 NOTE — Progress Notes (Signed)
Physical Therapy Re-evaluation/Treatment  Patient Details  Name: [name] MRN: [id number] Date of Birth: Apr 16, 1971  Today's Date: 10/22/2011 Time: 1720-1818 PT Time Calculation (min): 58 min  Visit#: 4  of 4   Re-eval:   Assessment Diagnosis: Myofascial pain to Low back  Authorization: Medicaid  Authorization Time Period: 09/28/2011 - 10/26/2011   Authorization Visit#: 3  of 3    Charge: therex 38 min MMT 1 unit ROM Measurement 1 unit Self care 10 min (TENS Unit education)   Subjective Symptoms/Limitations Symptoms: Pt reports compliance with HEP everyday, LBP center pain scale 4/10. Pain Assessment Currently in Pain?: Yes Pain Score:   4 Pain Location: Back Pain Orientation: Lower  Objective:   Cognition/Observation Observation/Other Assessments Observations: Murrell Redden Scale for Multifidus: 5/10 was 0/10, TrA 8/10 was 6/10  Sensation/Coordination/Flexibility/Functional Tests Functional Tests Functional Tests: Oswestry Low back pain scale 38% (was 36% initial eval)  Assessment RLE Strength Right Hip Flexion: 5/5 Right Hip Extension: 5/5 Right Hip ABduction: 5/5 Right Knee Flexion: 5/5 Right Knee Extension: 5/5 LLE Strength Left Hip Flexion:  (4+/5) Left Hip Extension: 4/5 Left Hip ABduction: 5/5 Left Knee Flexion: 4/5 Left Knee Extension: 5/5 Lumbar AROM Lumbar Flexion: WNL (was decreased 10% w/ gower sign) Lumbar Extension: decreased 5% (was decreased 25% with pain 3-4/10) Lumbar - Right Side Bend: WNL Lumbar - Left Side Bend: WNL w/ pain Lumbar - Right Rotation: WNL Lumbar - Left Rotation: WNL  Exercise/Treatments Mobility/Balance  Static Standing Balance Single Leg Stance - Right Leg: 14  (14" was 2" max of 3) Single Leg Stance - Left Leg: 8  (8" was 3" max of 3) Tandem Stance - Right Leg: 30  (was 7") Tandem Stance - Left Leg: 17  (was 7")   Standing Heel Raises: 20 reps;Limitations Heel Raises Limitations: toeraises 20 reps Functional  Squats: 10 reps;Limitations Functional Squats Limitations: proper lifting with orange ball to 6 in step Forward Lunge: 5 reps;Limitations Forward Lunge Limitations: bilateral Other Standing Lumbar Exercises: SLS R 14", L 8" max of 3 Supine Ab Set: 5 reps;Limitations AB Set Limitations: PFC/ TrA 10" holds Prone  Other Prone Lumbar Exercises: Multifidus 5x 10" holds  Physical Therapy Assessment and Plan PT Assessment and Plan Clinical Impression Statement: Re-assessment complete per 3rd visit with medicaid prior authorization.  [name] has had 3 sessions over 3 weeks wtih the following findings:  Pt has met 1/3 STG and 1/3 LTGs.  Pt with independent with HEP and able to demonstrate all exercises appropriately.   Pt stated pain average is 5/10 for 75% of his day.  Pt perceived functional abilities per the Oswestry low back pain scale was 38% this session.  Pt with improved overall LE strength and lumbar AROM are WNL.  Core strength has improved but is not 10/10 per Yetta Flock scale.  Pt does require cueing for proper mechanics with squats for proper lifting to reduce risk of injury.  Balance has improved with SLS and tandem stance/gait but pt does have LOB episodes he is able to regain balance independently with no assistance required.  Pt was given TENS unit per MD order, pt educated purpose and instructed technique, pt able to operate safely without questions.   PT Plan: D/C to HEP per 3rd visit with Medicaid.  Pt was given number for finicial support if feels like he needs more skilled intervention.    Goals Home Exercise Program Pt will Perform Home Exercise Program: Independently PT Goal: Perform Home Exercise Program - Progress:  Met PT Short Term Goals Time to Complete Short Term Goals: 2 weeks PT Short Term Goal 1: Pt wil be independent with a HEP. PT Short Term Goal 1 - Progress: Met PT Short Term Goal 2: Pt will improve his L and R SLS to 20 sec on static surface PT Short Term Goal 2 -  Progress: Progressing toward goal (R 14", L 8" max of 3 attempts) PT Short Term Goal 3: Pt willl improve R and L LE strength to WNL and improve core strength to multifidus and TrA to 10/10 on Hodges scale. PT Short Term Goal 3 - Progress: Partly met (LE Strength WNL, Core strength not 10/10) PT Short Term Goal 4 - Progress:  (R 14", L 8" max of 3 attempts) PT Long Term Goals Time to Complete Long Term Goals: 4 weeks PT Long Term Goal 1: Pt will report pain less than 3/10 for 75% of his day and score less a 30% on the ODI for improved QOL. PT Long Term Goal 1 - Progress: Not met (ODI score 38) PT Long Term Goal 2: Pt will improve his lumbar AROM to WNL without an increase in pain with lumbar extension, R SB or L quadrant extension. PT Long Term Goal 2 - Progress: Met Long Term Goal 3: Pt will improve lumbar AROM and LE flexibility in order to demo proper mechanics with squatting and lifting activities. Long Term Goal 3 Progress: Partly met (lumbar AROM WNL, cueing for proper mechanics with squatting )  Problem List Patient Active Problem List  Diagnoses  . COLONIC POLYPS, ADENOMATOUS  . DIABETES MELLITUS  . HYPERLIPIDEMIA  . MORBID OBESITY  . ANEMIA, NORMOCYTIC  . HYPERTENSION  . CVA  . HIATAL HERNIA  . SLEEP APNEA  . LOOSE STOOLS  . ALLERGY  . HELICOBACTER PYLORI GASTRITIS, HX OF    PT - End of Session Activity Tolerance: Patient tolerated treatment well General Behavior During Session: [facility] for tasks performed Cognition: [facility] for tasks performed  Juel Burrow, PTA 10/22/2011, 7:05 PM

## 2014-02-09 ENCOUNTER — Ambulatory Visit: Payer: Medicaid Other | Admitting: *Deleted

## 2014-02-12 ENCOUNTER — Encounter: Payer: Medicaid Other | Attending: Family Medicine | Admitting: *Deleted

## 2014-02-12 ENCOUNTER — Encounter: Payer: Self-pay | Admitting: *Deleted

## 2014-02-12 DIAGNOSIS — Z6841 Body Mass Index (BMI) 40.0 and over, adult: Secondary | ICD-10-CM | POA: Diagnosis not present

## 2014-02-12 DIAGNOSIS — Z713 Dietary counseling and surveillance: Secondary | ICD-10-CM | POA: Insufficient documentation

## 2014-02-12 DIAGNOSIS — Z9884 Bariatric surgery status: Secondary | ICD-10-CM | POA: Diagnosis not present

## 2014-02-12 NOTE — Progress Notes (Signed)
Medical Nutrition Therapy:  Appt start time: 0915 end time:  3419.  Assessment:  Patient here today for weight managment. He underwent gastric bypass in 2010, and worked with an RD extensively at that time. He lost weight from 595 pounds to 282.4 pounds today. Goal weight is 240 pounds. He reports that weight loss has slowed, and he has sometimes gotten off track, but generally follows dietary recommendations, focusing on getting adequate protein. He has met with a plastic surgeon regarding removal of excess skin. He is physically active, going to the gym 3-4 days a week. He recently restarted doing cardio, but has been doing resistance training. Patient was diabetic, but has gone off medications for this since the surgery.  MEDICATIONS: See list.    DIETARY INTAKE:   Usual eating pattern includes 3 meals and 2 snacks per day.  24-hr recall:  B ( AM): Egg, grits (slice cheese), green tea  Snk ( AM): Protein shake with milk L ( PM): Cheese with crackers Snk ( PM): None D ( PM): Chicken (3 oz), canned vegetables (mixed, green beans, peas, corn, carrots) OR eats out: chicken taco or salad Snk ( PM): Protein bar, cracker with peanut  Beverages: Water, milk, iced green tea with Splenda  Usual physical activity: Gym 2.5-3.5 hours 3-4 days weekly, cardio 30-45 minutes, resistance exercises (powerlifts)  Estimated energy needs: 1800 calories 180 g carbohydrates 135 g protein 60 g fat  Progress Towards Goal(s):  In progress.   Nutritional Diagnosis:  [place]-3.3 Overweight/obesity As related to history of excessive energy intake and physical inactivity.  As evidenced by BMI >30.    Intervention:  Nutrition counseling. We discussed strategies for weight loss with bariatric surgery, including eating adequate protein and non-starchy vegetables, eating 5-6 times daily, portion control, healthy snacks, and exercise.   Goals:  1. 2 pounds weight loss per week.  2. Eat 5-6 times daily.  3. Protein at  all meals and snacks.  4. Increase intake of non-starchy vegetables.  5. Focus on whole foods, vs. Processed foods.  6. Continue exercising as currently.   Handouts given during visit include:  Meal plan card  Weight loss tips  Monitoring/Evaluation:  Dietary intake, exercise, and body weight prn.

## 2014-05-29 ENCOUNTER — Encounter (INDEPENDENT_AMBULATORY_CARE_PROVIDER_SITE_OTHER): Payer: Self-pay | Admitting: *Deleted

## 2014-07-04 ENCOUNTER — Other Ambulatory Visit (INDEPENDENT_AMBULATORY_CARE_PROVIDER_SITE_OTHER): Payer: Self-pay | Admitting: *Deleted

## 2014-07-04 ENCOUNTER — Ambulatory Visit (INDEPENDENT_AMBULATORY_CARE_PROVIDER_SITE_OTHER): Payer: Medicaid Other | Admitting: Internal Medicine

## 2014-07-04 ENCOUNTER — Encounter (INDEPENDENT_AMBULATORY_CARE_PROVIDER_SITE_OTHER): Payer: Self-pay | Admitting: Internal Medicine

## 2014-07-04 ENCOUNTER — Telehealth (INDEPENDENT_AMBULATORY_CARE_PROVIDER_SITE_OTHER): Payer: Self-pay | Admitting: *Deleted

## 2014-07-04 VITALS — BP 146/72 | HR 60 | Temp 97.5°F | Ht 68.0 in | Wt 277.1 lb

## 2014-07-04 DIAGNOSIS — Z86010 Personal history of colonic polyps: Secondary | ICD-10-CM

## 2014-07-04 DIAGNOSIS — Z1211 Encounter for screening for malignant neoplasm of colon: Secondary | ICD-10-CM

## 2014-07-04 NOTE — Telephone Encounter (Signed)
Patient needs trilyte 

## 2014-07-04 NOTE — Patient Instructions (Signed)
Colonoscopy. The risks and benefits such as perforation, bleeding, and infection were reviewed with the patient and is agreeable.The risks and benefits such as perforation, bleeding, and infection were reviewed with the patient and is agreeable. 

## 2014-07-04 NOTE — Addendum Note (Signed)
Addended by: Butch Penny on: 07/04/2014 10:22 AM   Modules accepted: Orders, Medications

## 2014-07-04 NOTE — Progress Notes (Addendum)
   Subjective:    Patient ID: [name], male    DOB: 1970/06/21, 44 y.o.   MRN: [id number]  HPI Referred to our office by Dr.Kirk Bluth for screening colonoscopy. His last colonoscopy was in 2008 in [place] (Dr. Talmage Nap)  with 7 polyps.  He could not tell me what type of polyps they were. He will try to locate the report. Hx of gastric bypass. He has lost 320 pounds.. Weight before surgery 595. Appetite is good. Weight loss intentional. No abdominal pain. Usually has a BM every other day. Patient has included fiber in his diet. No melena or BRRB. No family hx of colon cancer. No GI problems. Since his surgery for weight loss, he has been able to come off his diabetic medication (Actos).   05/17/2014 H and H 13.8 and 40.6, platelet ct 183. Calcium 9.4, Total protein 7.1, Albumin 3.4, ALP 59, AST 20, Total bili 1.6, ALT 21, NA 139, K 4.6 Review of Systems   patient is disabled. Never married. No children Past Medical History  Diagnosis Date  . Diabetes mellitus   . Stroke     2006  . GERD (gastroesophageal reflux disease)   . Depression   . Arthritis     Past Surgical History  Procedure Laterality Date  . Bariatric surgery      2010 in [place]    No Known Allergies  Current Outpatient Prescriptions on File Prior to Visit  Medication Sig Dispense Refill  . ALPRAZolam (XANAX PO) Take by mouth.    . ASPIRIN PO Take by mouth.    . Calcium Carbonate-Vitamin D (CALCIUM + D PO) Take by mouth.    Marland Kitchen CALCIUM PO Take by mouth.    . cyanocobalamin 100 MCG tablet Take 100 mcg by mouth daily.    . fluticasone (VERAMYST) 27.5 MCG/SPRAY nasal spray Place 1 spray into the nose daily.    Marland Kitchen glucosamine-chondroitin 500-400 MG tablet Take 1 tablet by mouth 3 (three) times daily.    . Iron TABS Take by mouth.    . Multiple Vitamin (MULTIVITAMIN) tablet Take 1 tablet by mouth daily.    Marland Kitchen omega-3 acid ethyl esters (LOVAZA) 1 G capsule Take 1 g by mouth 2 (two) times daily.    Marland Kitchen  omeprazole (PRILOSEC) 20 MG capsule Take 20 mg by mouth daily.    . timolol (BETIMOL) 0.25 % ophthalmic solution 1-2 drops 2 (two) times daily.    Marland Kitchen tiZANidine (ZANAFLEX) 4 MG capsule Take 4 mg by mouth 3 (three) times daily.    . Pioglitazone HCl (ACTOS PO) Take by mouth.     No current facility-administered medications on file prior to visit.     Objective:   Physical Exam Filed Vitals:   07/04/14 1003  Height: 5\' 8"  (1.727 m)  Weight: 277 lb 1.6 oz (125.692 kg)  Alert and oriented. Skin warm and dry. Oral mucosa is moist.   . Sclera anicteric, conjunctivae is pink. Thyroid not enlarged. No cervical lymphadenopathy. Lungs clear. Heart regular rate and rhythm.  Abdomen is soft. Bowel sounds are positive. No hepatomegaly. No abdominal masses felt. No tenderness.  No edema to lower extremities.         Assessment & Plan:  Hx of colon polyps. Needs surveillance. No GI problems.  The risks and benefits such as perforation, bleeding, and infection were reviewed with the patient and is agreeable.

## 2014-07-11 MED ORDER — PEG 3350-KCL-NA BICARB-NACL 420 G PO SOLR: 4000.0000 mL | Freq: Once | ORAL | Status: DC

## 2014-07-12 ENCOUNTER — Encounter (INDEPENDENT_AMBULATORY_CARE_PROVIDER_SITE_OTHER): Payer: Self-pay

## 2014-08-02 ENCOUNTER — Ambulatory Visit (HOSPITAL_COMMUNITY)
Admission: RE | Admit: 2014-08-02 | Discharge: 2014-08-02 | Disposition: A | Discharge status: Home / Self Care | Payer: Medicaid Other | Source: Ambulatory Visit | Attending: Internal Medicine | Admitting: Internal Medicine

## 2014-08-02 ENCOUNTER — Encounter (HOSPITAL_COMMUNITY)
Admission: RE | Disposition: A | Discharge status: Home / Self Care | Payer: Self-pay | Source: Ambulatory Visit | Attending: Internal Medicine

## 2014-08-02 ENCOUNTER — Encounter (HOSPITAL_COMMUNITY): Payer: Self-pay | Admitting: Emergency Medicine

## 2014-08-02 DIAGNOSIS — F329 Major depressive disorder, single episode, unspecified: Secondary | ICD-10-CM | POA: Insufficient documentation

## 2014-08-02 DIAGNOSIS — K219 Gastro-esophageal reflux disease without esophagitis: Secondary | ICD-10-CM | POA: Insufficient documentation

## 2014-08-02 DIAGNOSIS — E119 Type 2 diabetes mellitus without complications: Secondary | ICD-10-CM | POA: Insufficient documentation

## 2014-08-02 DIAGNOSIS — K5730 Diverticulosis of large intestine without perforation or abscess without bleeding: Secondary | ICD-10-CM | POA: Diagnosis not present

## 2014-08-02 DIAGNOSIS — Z7951 Long term (current) use of inhaled steroids: Secondary | ICD-10-CM | POA: Diagnosis not present

## 2014-08-02 DIAGNOSIS — Z7982 Long term (current) use of aspirin: Secondary | ICD-10-CM | POA: Insufficient documentation

## 2014-08-02 DIAGNOSIS — Z09 Encounter for follow-up examination after completed treatment for conditions other than malignant neoplasm: Secondary | ICD-10-CM | POA: Diagnosis present

## 2014-08-02 DIAGNOSIS — Z79899 Other long term (current) drug therapy: Secondary | ICD-10-CM | POA: Diagnosis not present

## 2014-08-02 DIAGNOSIS — M1990 Unspecified osteoarthritis, unspecified site: Secondary | ICD-10-CM | POA: Insufficient documentation

## 2014-08-02 DIAGNOSIS — Z8673 Personal history of transient ischemic attack (TIA), and cerebral infarction without residual deficits: Secondary | ICD-10-CM | POA: Insufficient documentation

## 2014-08-02 DIAGNOSIS — Z86010 Personal history of colonic polyps: Secondary | ICD-10-CM | POA: Diagnosis not present

## 2014-08-02 DIAGNOSIS — D125 Benign neoplasm of sigmoid colon: Secondary | ICD-10-CM | POA: Insufficient documentation

## 2014-08-02 DIAGNOSIS — Z79891 Long term (current) use of opiate analgesic: Secondary | ICD-10-CM | POA: Diagnosis not present

## 2014-08-02 DIAGNOSIS — D120 Benign neoplasm of cecum: Secondary | ICD-10-CM | POA: Diagnosis not present

## 2014-08-02 HISTORY — PX: COLONOSCOPY: SHX5424

## 2014-08-02 SURGERY — COLONOSCOPY
Anesthesia: Moderate Sedation

## 2014-08-02 MED ORDER — MEPERIDINE HCL 50 MG/ML IJ SOLN
INTRAMUSCULAR | Status: AC
  Filled 2014-08-02: qty 1

## 2014-08-02 MED ORDER — MEPERIDINE HCL 50 MG/ML IJ SOLN
INTRAMUSCULAR | Status: DC | PRN
  Administered 2014-08-02 (×2): 25 mg via INTRAVENOUS

## 2014-08-02 MED ORDER — STERILE WATER FOR IRRIGATION IR SOLN
Status: DC | PRN
  Administered 2014-08-02: 12:00:00

## 2014-08-02 MED ORDER — SODIUM CHLORIDE 0.9 % IV SOLN
INTRAVENOUS | Status: DC
  Administered 2014-08-02: 20 mL/h via INTRAVENOUS

## 2014-08-02 MED ORDER — MIDAZOLAM HCL 5 MG/5ML IJ SOLN
INTRAMUSCULAR | Status: DC | PRN
Start: 2014-08-02 — End: 2014-08-02
  Administered 2014-08-02: 2 mg via INTRAVENOUS
  Administered 2014-08-02: 3 mg via INTRAVENOUS
  Administered 2014-08-02: 2 mg via INTRAVENOUS

## 2014-08-02 MED ORDER — MIDAZOLAM HCL 5 MG/5ML IJ SOLN
INTRAMUSCULAR | Status: AC
  Filled 2014-08-02: qty 10

## 2014-08-02 NOTE — Discharge Instructions (Signed)
Resume usual medications and diet. No driving for 24 hours. Physician will call with biopsy results.   Colonoscopy, Care After These instructions give you information on caring for yourself after your procedure. Your doctor may also give you more specific instructions. Call your doctor if you have any problems or questions after your procedure. HOME CARE  Do not drive for 24 hours.  Do not sign important papers or use machinery for 24 hours.  You may shower.  You may go back to your usual activities, but go slower for the first 24 hours.  Take rest breaks often during the first 24 hours.  Walk around or use warm packs on your belly (abdomen) if you have belly cramping or gas.  Drink enough fluids to keep your pee (urine) clear or pale yellow.  Resume your normal diet. Avoid heavy or fried foods.  Avoid drinking alcohol for 24 hours or as told by your doctor.  Only take medicines as told by your doctor. If a tissue sample (biopsy) was taken during the procedure:   Do not take aspirin or blood thinners for 7 days, or as told by your doctor.  Do not drink alcohol for 7 days, or as told by your doctor.  Eat soft foods for the first 24 hours. GET HELP IF: You still have a small amount of blood in your poop (stool) 2-3 days after the procedure. GET HELP RIGHT AWAY IF:  You have more than a small amount of blood in your poop.  You see clumps of tissue (blood clots) in your poop.  Your belly is puffy (swollen).  You feel sick to your stomach (nauseous) or throw up (vomit).  You have a fever.  You have belly pain that gets worse and medicine does not help. MAKE SURE YOU:  Understand these instructions.  Will watch your condition.  Will get help right away if you are not doing well or get worse. Document Released: 06/13/2010 Document Revised: 05/16/2013 Document Reviewed: 01/16/2013 [facility] Patient Information 2015 [place], [place]. This information is not intended to  replace advice given to you by your health care provider. Make sure you discuss any questions you have with your health care provider.

## 2014-08-02 NOTE — Op Note (Signed)
COLONOSCOPY PROCEDURE REPORT  PATIENT:  [name]  MR#:  [id number] Birthdate:  1970-08-26, 44 y.o., Male  Endoscopist:  Dr. Rogene Houston, MD Referred By:  Dr. Celedonio Savage, MD Procedure Date: 08/02/2014  Procedure:   Colonoscopy  Indications:  Patient is 44 year old Caucasian male was history of colonic adenomas. He had 7 adenomas removed in October 2008 measuring 3-6 mm. Last colonoscopy was in [facility] 5 years ago and no polyps were found. Family history is negative for CRC.  Informed Consent:  The procedure and risks were reviewed with the patient and informed consent was obtained.  Medications:  Demerol 50 mg IV Versed 7 mg IV  Description of procedure:  After a digital rectal exam was performed, that colonoscope was advanced from the anus through the rectum and colon to the area of the cecum, ileocecal valve and appendiceal orifice. The cecum was deeply intubated. These structures were well-seen and photographed for the record. From the level of the cecum and ileocecal valve, the scope was slowly and cautiously withdrawn. The mucosal surfaces were carefully surveyed utilizing scope tip to flexion to facilitate fold flattening as needed. The scope was pulled down into the rectum where a thorough exam including retroflexion was performed.  Findings:   Marginal prep. 5 mm polyp cold snared from cecum, 5 mm polyp cold snared from mid sigmoid colon. Single diverticulum at hepatic flexure. Normal mucosa of rectum and anorectal junction.   Therapeutic/Diagnostic Maneuvers Performed:  See above  Complications:  None  Cecal Withdrawal Time:  18 minutes  Impression:  Examination performed to cecum. Single diverticulum at splenic flexure. 5 mm cecal polyp was cold snared. 5 mm polyp at mid sigmoid colon was also cold snared. These polyps were submitted separately. Suboptimal prep limiting quality of this examination.  Recommendations:  Standard instructions  given. I will contact patient with biopsy results and further recommendations.  REHMAN,NAJEEB U  08/02/2014 12:23 PM  CC: Dr. Celedonio Savage, MD & Dr. Rayne Du ref. provider found

## 2014-08-02 NOTE — H&P (Signed)
[name] is an 44 y.o. male.   Chief Complaint: Patient  is here for colonoscopy. HPI: Patient is 44 year old Caucasian male with history of colonic adenomas and is here for surveillance colonoscopy. He underwent colonoscopy by Dr. Oneida [name] in October 2008 with removal of 7 polyps measuring 3-6 mm each and these are tubular adenomas. Patient had another colonoscopy about 5 years ago in [facility] by Dr. Marland [name] and no polyps are found. Patient denies abdominal pain rectal bleeding or diarrhea. Since bariatric surgery he he has lost over 250 pounds. Family history is negative for CRC. Patient has been treated for H. pylori infection(2008).  Past Medical History  Diagnosis Date  . Diabetes mellitus   . Stroke     2006  . GERD (gastroesophageal reflux disease)   . Depression   . Arthritis   . Colon polyps     Past Surgical History  Procedure Laterality Date  . Bariatric surgery      2010 in [place]    History reviewed. No pertinent family history. Social History:  reports that she has never smoked. She has never used smokeless tobacco. She reports that she does not drink alcohol or use illicit drugs.  Allergies: No Known Allergies  Medications Prior to Admission  Medication Sig Dispense Refill  . ALPRAZolam (XANAX) 1 MG tablet Take 1 mg by mouth at bedtime.  5  . aspirin EC 81 MG tablet Take 81 mg by mouth daily.    . Calcium Carbonate-Vitamin D (CALCIUM + D PO) Take by mouth.    . cyanocobalamin 100 MCG tablet Take 100 mcg by mouth daily.    . fluticasone (FLONASE) 50 MCG/ACT nasal spray Place 1 spray into both nostrils daily.  11  . Ginkgo Biloba 100 MG CAPS Take 1 capsule by mouth daily.     [name] glucosamine-chondroitin 500-400 MG tablet Take 1 tablet by mouth 3 (three) times daily.    . Iron TABS Take by mouth.    . latanoprost (XALATAN) 0.005 % ophthalmic solution Place 1 drop into both eyes at bedtime.    . lovastatin (MEVACOR) 20 MG tablet Take 20 mg  by mouth daily.  11  . Multiple Vitamin (MULTIVITAMIN) tablet Take 1 tablet by mouth daily.    . Omega-3 Fatty Acids (OMEGA 3 PO) Take 1 capsule by mouth daily.    [name] omeprazole (PRILOSEC) 20 MG capsule Take 20 mg by mouth daily.    . Oxycodone HCl 20 MG TABS Take 1 tablet by mouth 3 (three) times daily as needed (severe pain).    . polyethylene glycol-electrolytes (NULYTELY/GOLYTELY) 420 G solution Take 4,000 mLs by mouth once. 4000 mL 0  . saw palmetto 160 MG capsule Take 160 mg by mouth 2 (two) times daily.    . timolol (BETIMOL) 0.25 % ophthalmic solution 1-2 drops 2 (two) times daily.    [name] tiZANidine (ZANAFLEX) 4 MG tablet Take 4 mg by mouth every 8 (eight) hours as needed for muscle spasms.   1    No results found for this or any previous visit (from the past 48 hour(s)). No results found.  ROS  Blood pressure 144/73, pulse 72, temperature 97.6 F (36.4 C), temperature source Oral, resp. rate 16, SpO2 100 %. Physical Exam  Constitutional:  Well-developed is Caucasian male in NAD  HENT:  Mouth/Throat: Oropharynx is clear and moist.  Eyes: Conjunctivae are normal. No scleral icterus.  Neck: No thyromegaly present.  Cardiovascular: Normal rate, regular rhythm and normal  heart sounds.   No murmur heard. Respiratory: Effort normal and breath sounds normal.  GI:  Full abdomen with redundant abdominal wall. Abdomen is soft and nontender without organomegaly or masses.  Musculoskeletal: She exhibits no edema.  Lymphadenopathy:    She has no cervical adenopathy.  Neurological: She is alert.  Skin: Skin is warm and dry.     Assessment/Plan History of colonic adenomas. Surveillance colonoscopy.  [name] 08/02/2014, 11:39 AM

## 2014-08-06 ENCOUNTER — Encounter (HOSPITAL_COMMUNITY): Payer: Self-pay | Admitting: Internal Medicine

## 2014-08-09 ENCOUNTER — Encounter (INDEPENDENT_AMBULATORY_CARE_PROVIDER_SITE_OTHER): Payer: Self-pay | Admitting: *Deleted

## 2014-11-03 NOTE — Pre-Procedure Instructions (Signed)
[name]  11/03/2014     Your procedure is scheduled on June 20.  Report to [facility] Admitting at 8:30 A.M.  Call this number if you have problems the morning of surgery:  [phone]   Remember:  Do not eat food or drink liquids after midnight.  Take these medicines the morning of surgery with A SIP OF WATER: Tylenol (if needed), Flonase, Betimol, Zanaflex, Oxycodone (if needed)   STOP Saw Palmetto, Omega 3, Multiple Vitamin, Glucosamine, Ginkgo Biloba, B12, Calcium, Aspirin today   STOP/ Do not take Aspirin, Aleve, Naproxen, Advil, Ibuprofen, Motrin, Vitamins, Herbs, or Supplements starting today   Do not wear jewelry, make-up or nail polish.  Do not wear lotions, powders, or perfumes.  You may wear deodorant.  Do not shave 48 hours prior to surgery.  Men may shave face and neck.  Do not bring valuables to the hospital.  [facility] is not responsible for any belongings or valuables.  Contacts, dentures or bridgework may not be worn into surgery.  Leave your suitcase in the car.  After surgery it may be brought to your room.  For patients admitted to the hospital, discharge time will be determined by your treatment team.  Patients discharged the day of surgery will not be allowed to drive home.   [place] - Preparing for Surgery  Before surgery, you can play an important role.  Because skin is not sterile, your skin needs to be as free of germs as possible.  You can reduce the number of germs on you skin by washing with CHG (chlorahexidine gluconate) soap before surgery.  CHG is an antiseptic cleaner which kills germs and bonds with the skin to continue killing germs even after washing.  Please DO NOT use if you have an allergy to CHG or antibacterial soaps.  If your skin becomes reddened/irritated stop using the CHG and inform your nurse when you arrive at Short Stay.  Do not shave (including legs and underarms) for at least 48 hours prior to the first  CHG shower.  You may shave your face.  Please follow these instructions carefully:   1.  Shower with CHG Soap the night before surgery and the morning of Surgery.  2.  If you choose to wash your hair, wash your hair first as usual with your normal shampoo.  3.  After you shampoo, rinse your hair and body thoroughly to remove the shampoo.  4.  Use CHG as you would any other liquid soap.  You can apply CHG directly to the skin and wash gently with scrungie or a clean washcloth.  5.  Apply the CHG Soap to your body ONLY FROM THE NECK DOWN.  Do not use on open wounds or open sores.  Avoid contact with your eyes, ears, mouth and genitals (private parts).  Wash genitals (private parts) with your normal soap.  6.  Wash thoroughly, paying special attention to the area where your surgery will be performed.  7.  Thoroughly rinse your body with warm water from the neck down.  8.  DO NOT shower/wash with your normal soap after using and rinsing off the CHG Soap.  9.  Pat yourself dry with a clean towel.            10.  Wear clean pajamas.            11.  Place clean sheets on your bed the night of your first shower and  do not sleep with pets.  Day of Surgery  Do not apply any lotions the morning of surgery.  Please wear clean clothes to the hospital/surgery center.    Please read over the following fact sheets that you were given. Pain Booklet, Coughing and Deep Breathing and Surgical Site Infection Prevention

## 2014-11-05 ENCOUNTER — Encounter (HOSPITAL_COMMUNITY)
Admission: RE | Admit: 2014-11-05 | Discharge: 2014-11-05 | Disposition: A | Discharge status: Home / Self Care | Payer: Medicaid Other | Source: Ambulatory Visit | Attending: Specialist | Admitting: Specialist

## 2014-11-05 ENCOUNTER — Encounter (HOSPITAL_COMMUNITY): Payer: Self-pay | Admitting: Emergency Medicine

## 2014-11-05 ENCOUNTER — Encounter (HOSPITAL_COMMUNITY): Payer: Self-pay

## 2014-11-05 DIAGNOSIS — Z01818 Encounter for other preprocedural examination: Secondary | ICD-10-CM | POA: Insufficient documentation

## 2014-11-05 HISTORY — DX: Other specified postprocedural states: Z98.890

## 2014-11-05 HISTORY — DX: Nausea with vomiting, unspecified: R11.2

## 2014-11-05 HISTORY — DX: Cardiac arrhythmia, unspecified: I49.9

## 2014-11-05 HISTORY — DX: Sleep apnea, unspecified: G47.30

## 2014-11-05 LAB — BASIC METABOLIC PANEL
ANION GAP: 8 (ref 5–15)
BUN: 40 mg/dL — AB (ref 6–20)
CALCIUM: 9.3 mg/dL (ref 8.9–10.3)
CO2: 25 mmol/L (ref 22–32)
Chloride: 106 mmol/L (ref 101–111)
Creatinine, Ser: 1.81 mg/dL — ABNORMAL HIGH (ref 0.61–1.24)
GFR calc Af Amer: 50 mL/min — ABNORMAL LOW (ref >60)
GFR, EST NON AFRICAN AMERICAN: 44 mL/min — AB (ref >60)
GLUCOSE: 113 mg/dL — AB (ref 65–99)
Potassium: 4.3 mmol/L (ref 3.5–5.1)
Sodium: 139 mmol/L (ref 135–145)

## 2014-11-05 LAB — CBC
HEMATOCRIT: 41.9 % (ref 39.0–52.0)
HEMOGLOBIN: 14.6 g/dL (ref 13.0–17.0)
MCH: 31.5 pg (ref 26.0–34.0)
MCHC: 34.8 g/dL (ref 30.0–36.0)
MCV: 90.3 fL (ref 78.0–100.0)
Platelets: 182 10*3/uL (ref 150–400)
RBC: 4.64 MIL/uL (ref 4.22–5.81)
RDW: 13 % (ref 11.5–15.5)
WBC: 11.4 10*3/uL — ABNORMAL HIGH (ref 4.0–10.5)

## 2014-11-05 LAB — GLUCOSE, CAPILLARY: Glucose-Capillary: 94 mg/dL (ref 65–99)

## 2014-11-05 NOTE — Progress Notes (Signed)
Anesthesia Chart Review:  Pt is 44 year old male scheduled for total panniculectomy, ventral hernia repair and muscle repair, liposuction assisted on 11/12/2014 with Dr. Towanda Malkin.   PMH includes: DM, dysrhythmia, OSA, GERD, stroke (2006). Never smoker. BMI 42. S/p gastric bypass surgery, notes indicate he once weighed 595 and has lost 320 pounds.   Preoperative labs reviewed.  Cr 1.81, BUN 40. Labs from PCP's office located in media tab from 04/2014 show cr 1.35, BUN 30; from 04/2013, cr was 1.55, BUN 19. Notes from PCP 04/2014 do not list renal insufficiency as diagnosis.    EKG 11/05/2014: NSR. RBBB. LAFB. Bifascicular block.  TEE 05/08/2009: 1. Ok for DC cardioversion.  2. Preserved LV function. EF is in the 55% range.  3. Dilated R heart with normal function  Willeen Cass, FNP-BC [facility] Short Stay Surgical Center/Anesthesiology Phone: [phone] 11/05/2014 4:59 PM

## 2014-11-05 NOTE — Progress Notes (Signed)
Pt. States that his diabetes is now diet controlled and he does not check his blood sugar.

## 2014-11-05 NOTE — Progress Notes (Signed)
PCP- Dr. Wenda Overland  Cardiologist- Pt. Denies  Stress test- Pt. States greater than 5 years  EKG- 11/05/2014  Cardiac Cath- Pt. Denies  Echo- 2010- Care Everywhere

## 2014-11-06 LAB — HEMOGLOBIN A1C
HEMOGLOBIN A1C: 5.6 % (ref 4.8–5.6)
Mean Plasma Glucose: 114 mg/dL

## 2014-11-12 ENCOUNTER — Encounter (HOSPITAL_COMMUNITY): Admission: RE | Payer: Self-pay | Source: Ambulatory Visit

## 2014-11-12 ENCOUNTER — Ambulatory Visit (HOSPITAL_COMMUNITY): Admission: RE | Admit: 2014-11-12 | Payer: Medicaid Other | Source: Ambulatory Visit | Admitting: Specialist

## 2014-11-12 SURGERY — PANNICULECTOMY, ABDOMINAL, WITH LIPOSUCTION
Anesthesia: General

## 2014-12-10 ENCOUNTER — Encounter: Payer: Self-pay | Admitting: Genetic Counselor

## 2015-01-04 ENCOUNTER — Encounter (HOSPITAL_COMMUNITY)
Admission: RE | Admit: 2015-01-04 | Discharge: 2015-01-04 | Disposition: A | Discharge status: Home / Self Care | Payer: Medicaid Other | Source: Ambulatory Visit | Attending: Specialist | Admitting: Specialist

## 2015-01-04 ENCOUNTER — Encounter (HOSPITAL_COMMUNITY): Payer: Self-pay

## 2015-01-04 DIAGNOSIS — K439 Ventral hernia without obstruction or gangrene: Secondary | ICD-10-CM | POA: Diagnosis not present

## 2015-01-04 DIAGNOSIS — Z8673 Personal history of transient ischemic attack (TIA), and cerebral infarction without residual deficits: Secondary | ICD-10-CM | POA: Diagnosis not present

## 2015-01-04 DIAGNOSIS — Z7982 Long term (current) use of aspirin: Secondary | ICD-10-CM | POA: Diagnosis not present

## 2015-01-04 DIAGNOSIS — E119 Type 2 diabetes mellitus without complications: Secondary | ICD-10-CM | POA: Diagnosis not present

## 2015-01-04 DIAGNOSIS — Z01818 Encounter for other preprocedural examination: Secondary | ICD-10-CM | POA: Diagnosis present

## 2015-01-04 DIAGNOSIS — K219 Gastro-esophageal reflux disease without esophagitis: Secondary | ICD-10-CM | POA: Diagnosis not present

## 2015-01-04 DIAGNOSIS — Z9884 Bariatric surgery status: Secondary | ICD-10-CM | POA: Diagnosis not present

## 2015-01-04 DIAGNOSIS — Z01812 Encounter for preprocedural laboratory examination: Secondary | ICD-10-CM | POA: Insufficient documentation

## 2015-01-04 DIAGNOSIS — Z79899 Other long term (current) drug therapy: Secondary | ICD-10-CM | POA: Insufficient documentation

## 2015-01-04 LAB — BASIC METABOLIC PANEL
ANION GAP: 7 (ref 5–15)
BUN: 34 mg/dL — ABNORMAL HIGH (ref 6–20)
CALCIUM: 9.3 mg/dL (ref 8.9–10.3)
CO2: 28 mmol/L (ref 22–32)
Chloride: 105 mmol/L (ref 101–111)
Creatinine, Ser: 1.83 mg/dL — ABNORMAL HIGH (ref 0.61–1.24)
GFR calc Af Amer: 50 mL/min — ABNORMAL LOW (ref >60)
GFR, EST NON AFRICAN AMERICAN: 43 mL/min — AB (ref >60)
Glucose, Bld: 119 mg/dL — ABNORMAL HIGH (ref 65–99)
Potassium: 4.2 mmol/L (ref 3.5–5.1)
SODIUM: 140 mmol/L (ref 135–145)

## 2015-01-04 LAB — CBC
HCT: 40.4 % (ref 39.0–52.0)
Hemoglobin: 14 g/dL (ref 13.0–17.0)
MCH: 31.3 pg (ref 26.0–34.0)
MCHC: 34.7 g/dL (ref 30.0–36.0)
MCV: 90.2 fL (ref 78.0–100.0)
Platelets: 161 10*3/uL (ref 150–400)
RBC: 4.48 MIL/uL (ref 4.22–5.81)
RDW: 12.7 % (ref 11.5–15.5)
WBC: 11 10*3/uL — ABNORMAL HIGH (ref 4.0–10.5)

## 2015-01-04 NOTE — Pre-Procedure Instructions (Addendum)
[name]  01/04/2015      [id number] - [place], [place] [address] [place] [address] Phone: [phone] Fax: [phone]    Your procedure is scheduled on Monday, August 22nd   Report to [facility] Admitting at 9:45 AM  Call this number if you have problems the morning of surgery:  [phone]   Remember:  Do not eat food or drink liquids after midnight Sunday.  Take these medicines the morning of surgery with A SIP OF WATER :omeprazole,oxycodone if needed, timolol eyedrops,              Please STOP taking any anti-inflammatories(advil, aleve, ibuprofen, motrin, herbal supplements ,multi vit ,vitB, calcium, ginko biloba,glucosamine-condroitin,fish oil,aspirin,saw palmetto   Do not wear jewelry, no rings or watches.  Do not wear lotions or colognes.  You may NOT wear deodorant the day of surgery.             Men may shave face and neck.   Do not bring valuables to the hospital.  [facility] is not responsible for any belongings or valuables.  Contacts, dentures or bridgework may not be worn into surgery.  Leave your suitcase in the car.  After surgery it may be brought to your room. For patients admitted to the hospital, discharge time will be determined by your treatment team.  Name and phone number of your driver:     Special instructions:   Special Instructions: [facility] - Preparing for Surgery  Before surgery, you can play an important role.  Because skin is not sterile, your skin needs to be as free of germs as possible.  You can reduce the number of germs on you skin by washing with CHG (chlorahexidine gluconate) soap before surgery.  CHG is an antiseptic cleaner which kills germs and bonds with the skin to continue killing germs even after washing.  Please DO NOT use if you have an allergy to CHG or antibacterial soaps.  If your skin becomes reddened/irritated stop using the CHG and inform your  nurse when you arrive at Short Stay.  Do not shave (including legs and underarms) for at least 48 hours prior to the first CHG shower.  You may shave your face.  Please follow these instructions carefully:   1.  Shower with CHG Soap the night before surgery and the morning of Surgery.  2.  If you choose to wash your hair, wash your hair first as usual with your normal shampoo.  3.  After you shampoo, rinse your hair and body thoroughly to remove the Shampoo.  4.  Use CHG as you would any other liquid soap.  You can apply chg directly  to the skin and wash gently with scrungie or a clean washcloth.  5.  Apply the CHG Soap to your body ONLY FROM THE NECK DOWN.  Do not use on open wounds or open sores.  Avoid contact with your eyes ears, mouth and genitals (private parts).  Wash genitals (private parts)       with your normal soap.  6.  Wash thoroughly, paying special attention to the area where your surgery will be performed.  7.  Thoroughly rinse your body with warm water from the neck down.  8.  DO NOT shower/wash with your normal soap after using and rinsing off the CHG Soap.  9.  Pat yourself dry with a clean towel.  10.  Wear clean pajamas.            11.  Place clean sheets on your bed the night of your first shower and do not sleep with pets.  Day of Surgery  Do not apply any lotions/deodorants the morning of surgery.  Please wear clean clothes to the hospital/surgery center.  Please read over the following fact sheets that you were given. Pain Booklet, Coughing and Deep Breathing and Surgical Site Infection Prevention

## 2015-01-04 NOTE — Progress Notes (Addendum)
Patient states "kidney function better last dr visit. Was due to taking creatine while weight lifting. Stopped taking"

## 2015-01-07 NOTE — Progress Notes (Addendum)
Anesthesia Chart Review:  Pt is 44 year old male scheduled for total panniculectomy, ventral hernia repair and muscle repair, liposuction assisted on 01/14/2015 with Dr. Towanda Malkin.   PCP is Dr. Celedonio Savage in [place].   PMH includes: DM, dysrhythmia (s/p bariatric sugerys- had cardioversion), OSA (no longer has to use CPAP since weight loss), GERD, stroke (2006). Never smoker. BMI 41. S/p gastric bypass surgery, notes indicate he once weighed 595 and has lost 320 pounds.   Medications include: xanax, ASA, prilosec, timolol.   Preoperative labs reviewed. Cr 1.83, BUN 34. Labs from PCP's office located in media tab from 04/2014 show cr 1.35, BUN 30; from 04/2013, cr was 1.55, BUN 19. Notes from PCP 04/2014 do not list renal insufficiency as diagnosis. Have faxed lab results to pcp to review.   EKG 11/05/2014: NSR. RBBB. LAFB. Bifascicular block.  TEE 05/08/2009: 1. Ok for DC cardioversion.  2. Preserved LV function. EF is in the 55% range.  3. Dilated R heart with normal function  Willeen Cass, FNP-BC [facility]/Anesthesiology Phone: [phone] 01/07/2015 2:19 PM  Addendum: Dr. Wenda Overland faxed response to lab results. Pt ok for surgery with elevated creatinine, and he recommends nephrology consultation while inpatient if possible. I spoke with Tammy, Dr. Irene Shipper nurse and let her know I would pass this recommendation on to pt's surgeon, but that Dr. Wenda Overland may need to arrange for outpatient nephrology evaluation. Notified Ardell in Dr. Neita Goodnight office of Dr. Irene Shipper recommendation.   If no changes, I anticipate pt can proceed with surgery as scheduled.   Willeen Cass, FNP-BC [facility] Short Stay Surgical Center/Anesthesiology Phone: [phone] 01/09/2015 4:49 PM

## 2015-01-14 ENCOUNTER — Encounter (HOSPITAL_COMMUNITY): Payer: Self-pay | Admitting: Anesthesiology

## 2015-01-14 ENCOUNTER — Ambulatory Visit (HOSPITAL_COMMUNITY): Payer: Medicaid Other | Admitting: Anesthesiology

## 2015-01-14 ENCOUNTER — Encounter (HOSPITAL_COMMUNITY)
Admission: RE | Disposition: A | Discharge status: Home / Self Care | Payer: Self-pay | Source: Ambulatory Visit | Attending: Specialist

## 2015-01-14 ENCOUNTER — Observation Stay (HOSPITAL_COMMUNITY)
Admission: RE | Admit: 2015-01-14 | Discharge: 2015-01-15 | Disposition: A | Discharge status: Home / Self Care | Payer: Medicaid Other | Source: Ambulatory Visit | Attending: Specialist | Admitting: Specialist

## 2015-01-14 ENCOUNTER — Ambulatory Visit (HOSPITAL_COMMUNITY): Payer: Medicaid Other | Admitting: Emergency Medicine

## 2015-01-14 HISTORY — DX: Low back pain: M54.5

## 2015-01-14 HISTORY — DX: Personal history of other diseases of the musculoskeletal system and connective tissue: Z87.39

## 2015-01-14 HISTORY — PX: PANNICULECTOMY: SUR1001

## 2015-01-14 HISTORY — PX: VENTRAL HERNIA REPAIR: SHX424

## 2015-01-14 HISTORY — DX: Other chronic pain: G89.29

## 2015-01-14 HISTORY — DX: Low back pain, unspecified: M54.50

## 2015-01-14 HISTORY — PX: PANNICULECTOMY: SHX5360

## 2015-01-14 LAB — GLUCOSE, CAPILLARY
GLUCOSE-CAPILLARY: 148 mg/dL — AB (ref 65–99)
Glucose-Capillary: 115 mg/dL — ABNORMAL HIGH (ref 65–99)

## 2015-01-14 SURGERY — PANNICULECTOMY
Anesthesia: General | Site: Abdomen

## 2015-01-14 MED ORDER — DEXAMETHASONE SODIUM PHOSPHATE 4 MG/ML IJ SOLN
INTRAMUSCULAR | Status: DC | PRN
  Administered 2015-01-14: 8 mg via INTRAVENOUS

## 2015-01-14 MED ORDER — GLYCOPYRROLATE 0.2 MG/ML IJ SOLN
INTRAMUSCULAR | Status: AC
  Filled 2015-01-14: qty 1

## 2015-01-14 MED ORDER — MORPHINE SULFATE (PF) 2 MG/ML IV SOLN
2.0000 mg | INTRAVENOUS | Status: DC | PRN
  Administered 2015-01-14: 2 mg via INTRAVENOUS
  Filled 2015-01-14: qty 1

## 2015-01-14 MED ORDER — TIMOLOL MALEATE 0.25 % OP SOLN
1.0000 [drp] | Freq: Two times a day (BID) | OPHTHALMIC | Status: DC
  Administered 2015-01-14 – 2015-01-15 (×2): 1 [drp] via OPHTHALMIC
  Filled 2015-01-14: qty 5

## 2015-01-14 MED ORDER — ROCURONIUM BROMIDE 100 MG/10ML IV SOLN
INTRAVENOUS | Status: DC | PRN
  Administered 2015-01-14 (×2): 20 mg via INTRAVENOUS
  Administered 2015-01-14: 50 mg via INTRAVENOUS

## 2015-01-14 MED ORDER — PROPOFOL 10 MG/ML IV BOLUS
INTRAVENOUS | Status: AC
  Filled 2015-01-14: qty 20

## 2015-01-14 MED ORDER — GLYCOPYRROLATE 0.2 MG/ML IJ SOLN
INTRAMUSCULAR | Status: DC | PRN
  Administered 2015-01-14: 0.6 mg via INTRAVENOUS
  Administered 2015-01-14: 0.2 mg via INTRAVENOUS

## 2015-01-14 MED ORDER — ONDANSETRON HCL 4 MG/2ML IJ SOLN
INTRAMUSCULAR | Status: AC
  Filled 2015-01-14: qty 2

## 2015-01-14 MED ORDER — MIDAZOLAM HCL 5 MG/5ML IJ SOLN
INTRAMUSCULAR | Status: DC | PRN
  Administered 2015-01-14: 1 mg via INTRAVENOUS

## 2015-01-14 MED ORDER — ONDANSETRON HCL 4 MG/2ML IJ SOLN
INTRAMUSCULAR | Status: DC | PRN
  Administered 2015-01-14: 4 mg via INTRAVENOUS

## 2015-01-14 MED ORDER — SODIUM BICARBONATE 4 % IV SOLN
Freq: Once | INTRAVENOUS | Status: AC
  Administered 2015-01-14: 1000 mL via INTRAMUSCULAR
  Filled 2015-01-14: qty 50

## 2015-01-14 MED ORDER — ROCURONIUM BROMIDE 50 MG/5ML IV SOLN
INTRAVENOUS | Status: AC
  Filled 2015-01-14: qty 2

## 2015-01-14 MED ORDER — PROMETHAZINE HCL 25 MG/ML IJ SOLN: 6.2500 mg | INTRAMUSCULAR | Status: DC | PRN

## 2015-01-14 MED ORDER — CEFAZOLIN SODIUM-DEXTROSE 2-3 GM-% IV SOLR
2.0000 g | Freq: Three times a day (TID) | INTRAVENOUS | Status: DC
  Administered 2015-01-14 – 2015-01-15 (×3): 2 g via INTRAVENOUS
  Filled 2015-01-14 (×5): qty 50

## 2015-01-14 MED ORDER — ALPRAZOLAM 0.5 MG PO TABS
1.0000 mg | ORAL_TABLET | Freq: Every day | ORAL | Status: DC
  Administered 2015-01-14: 1 mg via ORAL
  Filled 2015-01-14: qty 2

## 2015-01-14 MED ORDER — DEXAMETHASONE SODIUM PHOSPHATE 4 MG/ML IJ SOLN
INTRAMUSCULAR | Status: AC
  Filled 2015-01-14: qty 2

## 2015-01-14 MED ORDER — MEPERIDINE HCL 25 MG/ML IJ SOLN: 6.2500 mg | INTRAMUSCULAR | Status: DC | PRN

## 2015-01-14 MED ORDER — LACTATED RINGERS IV SOLN
INTRAVENOUS | Status: DC | PRN
  Administered 2015-01-14 (×2): via INTRAVENOUS

## 2015-01-14 MED ORDER — PROPOFOL 10 MG/ML IV BOLUS
INTRAVENOUS | Status: DC | PRN
  Administered 2015-01-14: 150 mg via INTRAVENOUS

## 2015-01-14 MED ORDER — SCOPOLAMINE 1 MG/3DAYS TD PT72
MEDICATED_PATCH | TRANSDERMAL | Status: AC
  Filled 2015-01-14: qty 1

## 2015-01-14 MED ORDER — CEFAZOLIN SODIUM-DEXTROSE 2-3 GM-% IV SOLR
2.0000 g | INTRAVENOUS | Status: AC
  Administered 2015-01-14: 2 g via INTRAVENOUS

## 2015-01-14 MED ORDER — SCOPOLAMINE 1 MG/3DAYS TD PT72
MEDICATED_PATCH | TRANSDERMAL | Status: DC | PRN
  Administered 2015-01-14: 1 via TRANSDERMAL

## 2015-01-14 MED ORDER — LATANOPROST 0.005 % OP SOLN
1.0000 [drp] | Freq: Every day | OPHTHALMIC | Status: DC
  Administered 2015-01-14: 1 [drp] via OPHTHALMIC
  Filled 2015-01-14: qty 2.5

## 2015-01-14 MED ORDER — GLYCOPYRROLATE 0.2 MG/ML IJ SOLN
INTRAMUSCULAR | Status: AC
Start: 2015-01-14 — End: 2015-01-14
  Filled 2015-01-14: qty 3

## 2015-01-14 MED ORDER — ONDANSETRON 4 MG PO TBDP: 4.0000 mg | ORAL_TABLET | Freq: Four times a day (QID) | ORAL | Status: DC | PRN

## 2015-01-14 MED ORDER — SODIUM CHLORIDE 0.9 % IV SOLN
INTRAVENOUS | Status: DC
  Administered 2015-01-14: 10:00:00 via INTRAVENOUS

## 2015-01-14 MED ORDER — NEOSTIGMINE METHYLSULFATE 10 MG/10ML IV SOLN
INTRAVENOUS | Status: DC | PRN
  Administered 2015-01-14: 4 mg via INTRAVENOUS

## 2015-01-14 MED ORDER — HYDROMORPHONE HCL 1 MG/ML IJ SOLN: 0.2500 mg | INTRAMUSCULAR | Status: DC | PRN

## 2015-01-14 MED ORDER — PHENYLEPHRINE HCL 10 MG/ML IJ SOLN
10.0000 mg | INTRAVENOUS | Status: DC | PRN
  Administered 2015-01-14: 15 ug/min via INTRAVENOUS

## 2015-01-14 MED ORDER — DEXTROSE IN LACTATED RINGERS 5 % IV SOLN
INTRAVENOUS | Status: DC
  Administered 2015-01-14 – 2015-01-15 (×2): via INTRAVENOUS

## 2015-01-14 MED ORDER — FENTANYL CITRATE (PF) 100 MCG/2ML IJ SOLN
INTRAMUSCULAR | Status: DC | PRN
  Administered 2015-01-14: 25 ug via INTRAVENOUS
  Administered 2015-01-14 (×3): 50 ug via INTRAVENOUS

## 2015-01-14 MED ORDER — LIDOCAINE HCL (CARDIAC) 20 MG/ML IV SOLN
INTRAVENOUS | Status: DC | PRN
  Administered 2015-01-14: 80 mg via INTRAVENOUS

## 2015-01-14 MED ORDER — ONDANSETRON HCL 4 MG/2ML IJ SOLN: 4.0000 mg | Freq: Four times a day (QID) | INTRAMUSCULAR | Status: DC | PRN

## 2015-01-14 MED ORDER — FENTANYL CITRATE (PF) 250 MCG/5ML IJ SOLN
INTRAMUSCULAR | Status: AC
  Filled 2015-01-14: qty 5

## 2015-01-14 MED ORDER — SUCCINYLCHOLINE CHLORIDE 20 MG/ML IJ SOLN
INTRAMUSCULAR | Status: DC | PRN
  Administered 2015-01-14: 160 mg via INTRAVENOUS

## 2015-01-14 MED ORDER — EPHEDRINE SULFATE 50 MG/ML IJ SOLN
INTRAMUSCULAR | Status: DC | PRN
  Administered 2015-01-14 (×3): 5 mg via INTRAVENOUS
  Administered 2015-01-14: 10 mg via INTRAVENOUS
  Administered 2015-01-14 (×3): 5 mg via INTRAVENOUS
  Administered 2015-01-14: 10 mg via INTRAVENOUS

## 2015-01-14 MED ORDER — TIMOLOL HEMIHYDRATE 0.25 % OP SOLN: 2.0000 [drp] | Freq: Two times a day (BID) | OPHTHALMIC | Status: DC

## 2015-01-14 MED ORDER — MIDAZOLAM HCL 2 MG/2ML IJ SOLN
INTRAMUSCULAR | Status: AC
  Filled 2015-01-14: qty 4

## 2015-01-14 MED ORDER — LACTATED RINGERS IV SOLN: INTRAVENOUS | Status: DC

## 2015-01-14 SURGICAL SUPPLY — 59 items
ATCH SMKEVC FLXB CAUT HNDSWH (FILTER) ×1 IMPLANT
BAG DECANTER FOR FLEXI CONT (MISCELLANEOUS) ×2 IMPLANT
BENZOIN TINCTURE PRP APPL 2/3 (GAUZE/BANDAGES/DRESSINGS) ×4 IMPLANT
BINDER ABDOMINAL 12 ML 46-62 (SOFTGOODS) ×2 IMPLANT
COTTONBALL LRG STERILE PKG (GAUZE/BANDAGES/DRESSINGS) ×2 IMPLANT
COVER SURGICAL LIGHT HANDLE (MISCELLANEOUS) ×4 IMPLANT
DRAPE LAPAROSCOPIC ABDOMINAL (DRAPES) ×2 IMPLANT
DRSG PAD ABDOMINAL 8X10 ST (GAUZE/BANDAGES/DRESSINGS) ×2 IMPLANT
ELECT BLADE 4.0 EZ CLEAN MEGAD (MISCELLANEOUS) ×2
ELECT CAUTERY BLADE 6.4 (BLADE) ×2 IMPLANT
ELECT REM PT RETURN 9FT ADLT (ELECTROSURGICAL) ×2
ELECTRODE BLDE 4.0 EZ CLN MEGD (MISCELLANEOUS) ×1 IMPLANT
ELECTRODE REM PT RTRN 9FT ADLT (ELECTROSURGICAL) ×1 IMPLANT
EVACUATOR 3/16  PVC DRAIN (DRAIN) ×2
EVACUATOR 3/16 PVC DRAIN (DRAIN) ×2 IMPLANT
EVACUATOR PREFILTER SMOKE (MISCELLANEOUS) ×2 IMPLANT
EVACUATOR SMOKE ACCUVAC VALLEY (FILTER) ×1
FILTER SMOKE EVAC LAPAROSHD (FILTER) ×2 IMPLANT
GAUZE SPONGE 4X4 12PLY STRL (GAUZE/BANDAGES/DRESSINGS) ×4 IMPLANT
GAUZE XEROFORM 5X9 LF (GAUZE/BANDAGES/DRESSINGS) ×2 IMPLANT
GLOVE BIO SURGEON STRL SZ7.5 (GLOVE) ×4 IMPLANT
GLOVE BIOGEL PI IND STRL 7.0 (GLOVE) ×1 IMPLANT
GLOVE BIOGEL PI IND STRL 7.5 (GLOVE) ×2 IMPLANT
GLOVE BIOGEL PI INDICATOR 7.0 (GLOVE) ×1
GLOVE BIOGEL PI INDICATOR 7.5 (GLOVE) ×2
GLOVE ECLIPSE 7.0 STRL STRAW (GLOVE) ×4 IMPLANT
GLOVE SURG SIGNA 7.5 PF LTX (GLOVE) ×4 IMPLANT
GLOVE SURG SS PI 7.0 STRL IVOR (GLOVE) ×2 IMPLANT
GOWN STRL REUS W/ TWL LRG LVL3 (GOWN DISPOSABLE) ×3 IMPLANT
GOWN STRL REUS W/ TWL XL LVL3 (GOWN DISPOSABLE) ×1 IMPLANT
GOWN STRL REUS W/TWL LRG LVL3 (GOWN DISPOSABLE) ×3
GOWN STRL REUS W/TWL XL LVL3 (GOWN DISPOSABLE) ×1
KIT BASIN OR (CUSTOM PROCEDURE TRAY) ×2 IMPLANT
KIT ROOM TURNOVER OR (KITS) ×2 IMPLANT
MARKER SKIN DUAL TIP RULER LAB (MISCELLANEOUS) ×2 IMPLANT
NEEDLE SPNL 18GX3.5 QUINCKE PK (NEEDLE) ×4 IMPLANT
NS IRRIG 1000ML POUR BTL (IV SOLUTION) ×4 IMPLANT
PACK GENERAL/GYN (CUSTOM PROCEDURE TRAY) ×2 IMPLANT
PAD ARMBOARD 7.5X6 YLW CONV (MISCELLANEOUS) ×4 IMPLANT
PIN SAFETY STERILE (MISCELLANEOUS) ×2 IMPLANT
PREFILTER EVAC NS 1 1/3-3/8IN (MISCELLANEOUS) ×2 IMPLANT
PREFILTER SMOKE EVAC (FILTER) ×2 IMPLANT
SPECIMEN JAR LARGE (MISCELLANEOUS) ×4 IMPLANT
SPONGE GAUZE 4X4 12PLY STER LF (GAUZE/BANDAGES/DRESSINGS) ×2 IMPLANT
SPONGE LAP 18X18 X RAY DECT (DISPOSABLE) ×8 IMPLANT
STAPLER VISISTAT 35W (STAPLE) ×4 IMPLANT
STRIP CLOSURE SKIN 1/2X4 (GAUZE/BANDAGES/DRESSINGS) ×8 IMPLANT
SUT ETHILON 2 0 FS 18 (SUTURE) ×6 IMPLANT
SUT MNCRL AB 3-0 PS2 18 (SUTURE) ×10 IMPLANT
SUT MON AB 2-0 CT1 36 (SUTURE) ×8 IMPLANT
SUT MON AB 5-0 PS2 18 (SUTURE) ×4 IMPLANT
SUT PROLENE 1 XLH 60 (SUTURE) IMPLANT
SUT PROLENE 3 0 PS 1 (SUTURE) ×8 IMPLANT
SYR 50ML LL SCALE MARK (SYRINGE) ×4 IMPLANT
SYR 50ML SLIP (SYRINGE) ×4 IMPLANT
TOWEL OR 17X24 6PK STRL BLUE (TOWEL DISPOSABLE) ×2 IMPLANT
TOWEL OR 17X26 10 PK STRL BLUE (TOWEL DISPOSABLE) ×4 IMPLANT
TUBE CONNECTING 12X1/4 (SUCTIONS) ×2 IMPLANT
TUBING 1/4  WITH 7/8  ADAPTER (MISCELLANEOUS) ×2 IMPLANT

## 2015-01-14 NOTE — Transfer of Care (Signed)
Immediate Anesthesia Transfer of Care Note  Patient: [name]  Procedure(s) Performed: Procedure(s):  TOTAL PANNICULECTOMY WTH LIPOSUCTION OF SIDES (N/A) HERNIA REPAIR VENTRAL ADULT AND MUSCLE REPAIR (N/A)  Patient Location: PACU  Anesthesia Type:General  Level of Consciousness: awake, alert  and oriented  Airway & Oxygen Therapy: Patient Spontanous Breathing and Patient connected to nasal cannula oxygen  Post-op Assessment: Report given to RN and Post -op Vital signs reviewed and stable  Post vital signs: Reviewed and stable  Last Vitals:  Filed Vitals:   01/14/15 0944  BP: 164/74  Pulse: 74  Temp: 36.4 C  Resp: 16    Complications: No apparent anesthesia complications

## 2015-01-14 NOTE — Anesthesia Postprocedure Evaluation (Signed)
  Anesthesia Post-op Note  Patient: [name]  Procedure(s) Performed: Procedure(s):  TOTAL PANNICULECTOMY WTH LIPOSUCTION OF SIDES (N/A) HERNIA REPAIR VENTRAL ADULT AND MUSCLE REPAIR (N/A)  Patient Location: PACU  Anesthesia Type:General  Level of Consciousness: awake, alert  and oriented  Airway and Oxygen Therapy: Patient Spontanous Breathing and Patient connected to nasal cannula oxygen  Post-op Pain: mild  Post-op Assessment: Post-op Vital signs reviewed and Patient's Cardiovascular Status Stable              Post-op Vital Signs: Reviewed and stable  Last Vitals:  Filed Vitals:   01/14/15 1645  BP:   Pulse:   Temp: 36.7 C  Resp:     Complications: No apparent anesthesia complications

## 2015-01-14 NOTE — H&P (Signed)
[name] is an 44 y.o. male.   Chief Complaint:Increased panniculus and large ventral hernia HPI: Patient has lost over 170 lbs has increased panniculus with increased rashe  wit6h severe intertrig largr ventral hernia 26x16 cm  Past Medical History  Diagnosis Date  . Stroke     2006  . GERD (gastroesophageal reflux disease)   . Arthritis   . Colon polyps   . PONV (postoperative nausea and vomiting)   . Chronic back pain   . Dysrhythmia     s/p surgery bariatric- cardioversion  . Sleep apnea     no longer have to use cpap since wt loss  . Diabetes mellitus     diet controlled- no meds since wt loss  . Depression     denies    Past Surgical History  Procedure Laterality Date  . Bariatric surgery      2010 in [place]  . Colonoscopy N/A 08/02/2014    Procedure: COLONOSCOPY;  Surgeon: Rogene Houston, MD;  Location: [facility];  Service: Endoscopy;  Laterality: N/A;  210 - moved to 3/10 @ 11:55 - Ann notified pt  . Cardioversion    . Esophagogastroduodenoscopy    . Tonsillectomy      No family history on file. Social History:  reports that he has never smoked. He has never used smokeless tobacco. He reports that he does not drink alcohol or use illicit drugs.  Allergies: No Known Allergies  No prescriptions prior to admission    No results found for this or any previous visit (from the past 48 hour(s)). No results found.  Review of Systems  Constitutional: Negative.   HENT: Negative.   Eyes: Negative.   Respiratory: Negative.   Cardiovascular: Negative.   Gastrointestinal: Negative.   Genitourinary: Negative.   Musculoskeletal: Positive for back pain.  Skin: Positive for rash.  Neurological: Negative.   Endo/Heme/Allergies: Negative.   Psychiatric/Behavioral: Negative.     There were no vitals taken for this visit. Physical Exam   Assessment/Plan Panniculus severe with increased symptomatology  For panniculectomy and plastic closure and repaie of  large ventral hernia  [name] L 01/14/2015, 9:06 AM

## 2015-01-14 NOTE — Brief Op Note (Signed)
01/14/2015  2:51 PM  PATIENT:  [name]  44 y.o. male  PRE-OPERATIVE DIAGNOSIS:  PANNICULITIES VENTRAL HERNIA  POST-OPERATIVE DIAGNOSIS:  PANNICULITIES VENTRAL HERNIA  PROCEDURE:  Procedure(s):  TOTAL PANNICULECTOMY WTH LIPOSUCTION OF SIDES (N/A) HERNIA REPAIR VENTRAL ADULT AND MUSCLE REPAIR (N/A)  SURGEON:  Surgeon(s) and Role:    * Cristine Polio, MD - Primary  PHYSICIAN ASSISTANT:   ASSISTANTS: none   ANESTHESIA:   general  EBL:  Total I/O In: 1500 [I.V.:1500] Out: 750 [Urine:450; Blood:300]  BLOOD ADMINISTERED:none  DRAINS: (pubic area) Hemovact drain(s) in the pubic area with  Suction Open   LOCAL MEDICATIONS USED:  LIDOCAINE   SPECIMEN:  Excision  DISPOSITION OF SPECIMEN:  PATHOLOGY  COUNTS:  YES  TOURNIQUET:  * No tourniquets in log *  DICTATION: .Other Dictation: Dictation Number [id number]  PLAN OF CARE: Admit for overnight observation  PATIENT DISPOSITION:  PACU - hemodynamically stable.   Delay start of Pharmacological VTE agent (>24hrs) due to surgical blood loss or risk of bleeding: yes

## 2015-01-14 NOTE — Anesthesia Procedure Notes (Signed)
Procedure Name: Intubation Date/Time: 01/14/2015 11:53 AM Performed by: Susa Loffler Pre-anesthesia Checklist: Patient identified, Emergency Drugs available, Suction available, Patient being monitored and Timeout performed Patient Re-evaluated:Patient Re-evaluated prior to inductionOxygen Delivery Method: Circle system utilized Preoxygenation: Pre-oxygenation with 100% oxygen Intubation Type: IV induction, Rapid sequence and Cricoid Pressure applied Ventilation: Mask ventilation without difficulty Laryngoscope Size: Mac and 4 Grade View: Grade II Tube type: Oral Tube size: 7.5 mm Number of attempts: 1 Placement Confirmation: ETT inserted through vocal cords under direct vision,  positive ETCO2 and breath sounds checked- equal and bilateral Secured at: 23 cm Tube secured with: Tape Dental Injury: Teeth and Oropharynx as per pre-operative assessment

## 2015-01-14 NOTE — Anesthesia Preprocedure Evaluation (Addendum)
Anesthesia Evaluation  Patient identified by MRN, date of birth, ID band Patient awake    Reviewed: Allergy & Precautions, NPO status , Patient's Chart, lab work & pertinent test results  History of Anesthesia Complications (+) PONV and history of anesthetic complications  Airway Mallampati: III  TM Distance: >3 FB Neck ROM: Full    Dental  (+) Teeth Intact   Pulmonary sleep apnea and Continuous Positive Airway Pressure Ventilation ,  breath sounds clear to auscultation        Cardiovascular Exercise Tolerance: Poor hypertension, + Peripheral Vascular Disease Rhythm:Regular Rate:Normal     Neuro/Psych PSYCHIATRIC DISORDERS Depression  Neuromuscular disease CVA, No Residual Symptoms    GI/Hepatic Neg liver ROS, GERD-  Medicated,  Endo/Other  diabetes  Renal/GU negative Renal ROS  negative genitourinary   Musculoskeletal  (+) Arthritis -, Osteoarthritis,    Abdominal   Peds negative pediatric ROS (+)  Hematology  (+) anemia ,   Anesthesia Other Findings   Reproductive/Obstetrics                           Lab Results  Component Value Date   WBC 11.0* 01/04/2015   HGB 14.0 01/04/2015   HCT 40.4 01/04/2015   MCV 90.2 01/04/2015   PLT 161 01/04/2015   Lab Results  Component Value Date   CREATININE 1.83* 01/04/2015   BUN 34* 01/04/2015   NA 140 01/04/2015   K 4.2 01/04/2015   CL 105 01/04/2015   CO2 28 01/04/2015     Anesthesia Physical Anesthesia Plan  ASA: III  Anesthesia Plan: General   Post-op Pain Management:    Induction: Intravenous  Airway Management Planned: Oral ETT and Video Laryngoscope Planned  Additional Equipment:   Intra-op Plan:   Post-operative Plan: Extubation in OR  Informed Consent: I have reviewed the patients History and Physical, chart, labs and discussed the procedure including the risks, benefits and alternatives for the proposed anesthesia  with the patient or authorized representative who has indicated his/her understanding and acceptance.   Dental advisory given  Plan Discussed with: CRNA  Anesthesia Plan Comments:         Anesthesia Quick Evaluation

## 2015-01-15 ENCOUNTER — Encounter (HOSPITAL_COMMUNITY): Payer: Self-pay | Admitting: General Practice

## 2015-01-15 LAB — BASIC METABOLIC PANEL
Anion gap: 3 — ABNORMAL LOW (ref 5–15)
BUN: 23 mg/dL — AB (ref 6–20)
CO2: 29 mmol/L (ref 22–32)
CREATININE: 1.66 mg/dL — AB (ref 0.61–1.24)
Calcium: 8.5 mg/dL — ABNORMAL LOW (ref 8.9–10.3)
Chloride: 106 mmol/L (ref 101–111)
GFR, EST AFRICAN AMERICAN: 56 mL/min — AB (ref >60)
GFR, EST NON AFRICAN AMERICAN: 48 mL/min — AB (ref >60)
Glucose, Bld: 178 mg/dL — ABNORMAL HIGH (ref 65–99)
POTASSIUM: 4.6 mmol/L (ref 3.5–5.1)
SODIUM: 138 mmol/L (ref 135–145)

## 2015-01-15 LAB — CBC
HCT: 35.5 % — ABNORMAL LOW (ref 39.0–52.0)
Hemoglobin: 12.2 g/dL — ABNORMAL LOW (ref 13.0–17.0)
MCH: 30.7 pg (ref 26.0–34.0)
MCHC: 34.4 g/dL (ref 30.0–36.0)
MCV: 89.4 fL (ref 78.0–100.0)
PLATELETS: 168 10*3/uL (ref 150–400)
RBC: 3.97 MIL/uL — AB (ref 4.22–5.81)
RDW: 12.3 % (ref 11.5–15.5)
WBC: 13.3 10*3/uL — ABNORMAL HIGH (ref 4.0–10.5)

## 2015-01-15 LAB — HEPATITIS B SURFACE ANTIGEN: HEP B S AG: NEGATIVE

## 2015-01-15 LAB — HEPATITIS C ANTIBODY: HCV AB: 0.2 {s_co_ratio} (ref 0.0–0.9)

## 2015-01-15 NOTE — Op Note (Signed)
NAMEDELAN, KSIAZEK NO.:  [id number]  MEDICAL RECORD NO.:  [id number]  LOCATION:  6N32C                        FACILITY:  [facility]  PHYSICIAN:  Berneta Sages L. Onesimo Lingard, M.D.DATE OF BIRTH:  1970-08-21  DATE OF PROCEDURE:  01/14/2015 DATE OF DISCHARGE:                              OPERATIVE REPORT   INDICATIONS FOR PROCEDURE:  This is a 44 year old gentleman who over the last 3 or 4 years has lost from over 550 pounds down to his current weight of approximately 250.  In the natural  process of eating promptly and exercising, he has lost a total of approximately 300 pounds.  The patient is now demonstrating severe panniculus as he stands.  Panniculus goes all the way down past both knees.  This has caused increased discomfort to his back, shoulders as well as recurrent recalcitrant intertriginous changes, which has caused the patient to use excess creams, medications, antifungal medications, multiple-multiple times during the day, powders, etc. just really the rashes that have accumulated over that period of time.  He also demonstrated weakness around the right paramedian umbilical region when he lays flat and indents the end approximately 4-5 inches.  PROCEDURE:  Exploration of the abdomen with severe panniculus with panniculectomy, repair of ventral hernia, periumbilical area measuring approximately 12 x 16 cm.  ANESTHESIA:  General.  DESCRIPTION OF PROCEDURE:  Preoperatively, the patient was stood up and drawn for the measures of the amount of panniculus to be removed as well as outlined in the weakness in the periumbilical region of right side. He was then taken to the operating room, placed on the operating room table in the supine position.  He was given adequate general anesthesia, intubated orally.  The patient underwent Foley catheterization, then prep was done of the abdominal region, breast areas and groin, thighs with Hibiclens soap and solution and  walled off with sterile towels and drapes, so as to make a sterile field.  Tumescent solution was injected locally approximately 1000 mL.  This was allowed to sit up previously drawn areas at the end, opening with the Bovie on cutting down to underlying Scarpa fascia.  Hemostasis maintained the Bovie on coagulation.  After we entered down through Scarpa fascia, went down to the areolar tissue overlying the fascia of the abdomen.  Dissection was carried then upwards towards the umbilicus.  The umbilicus was released on to soft pedicle and then dissection then carried up to the midline portion just under the xiphoid process as well as the right and left lateral areas.  Excess lipodystrophy was treated with liposuction __________ on the right lateral sections and left lateral section removal over 600 mL of fatty material.  The repair was done of the hernia with multiple-multiple sutures of 2-0 Surgilon throughout the area.  The patient was placed in [place] position.  Excess skin of the panniculus was removed approximately 25 pounds.  This was then taken off the table.  Hemostasis maintained again with the Bovie and coagulation.  Throughout the procedure, the patient demonstrated excess collateral vasculature causing increased oozing and use of the Bovie, multiple-multiple time much more than normal.  After appropriate hemostasis, the wound was irrigated with saline.  The appendicostomy in the flaps was then closed with 2-0 Monocryl subcutaneously x2 layers and then a running subcuticular stitch of 3-0 Monocryl.  The wounds were drained with a large branch drain placed through right and left pubic regions x2.  They were secured with 3-0 Prolene sutures.  The wounds were cleansed.  Opening was made for the umbilicus, brought by through and secured with 2-0 Monocryl subcutaneously and multiple sutures of 5-0 nylon.  The wounds were cleansed.  Half-inch Steri-Strips, soft dressing applied on  all areas, Xeroform gauze, 4 x 4s, ABDs, Hypafix tape and a large abdominal support.  Tolerated the procedure very well, was taken to recovery room in excellent condition.  Estimated blood loss is 300 mL.  Complications none.     Odella Aquas. Towanda Malkin, M.D.     Elie Confer  D:  01/14/2015  T:  01/15/2015  Job:  [id number]

## 2015-01-15 NOTE — Discharge Instructions (Signed)
DICTATED THE DISCHARGE SUMMARY AT DISCHARGE [phone]

## 2015-01-15 NOTE — Progress Notes (Signed)
[name] to be D/C'd Home per MD order.  Discussed with the patient and all questions fully answered.  VSS, Skin clean, dry and intact without evidence of skin break down, no evidence of skin tears noted. IV catheter discontinued intact. Site without signs and symptoms of complications. Dressing and pressure applied.  An After Visit Summary was printed and given to the patient. Patient is going by Dr's [place]'s office to get Rx for pain per Dr's request.   D/c education completed with patient/family including follow up instructions, medication list, d/c activities limitations if indicated, with other d/c instructions as indicated by MD - patient able to verbalize understanding, all questions fully answered. Pt's family was taught how to empty the Hemovac and family return demonstrated.  Educational handout also given for home.   Patient instructed to return to ED, call 911, or call MD for any changes in condition.   Patient will be escorted via WC, and D/C home via private auto.  [name] 01/15/2015 3:47 PM

## 2015-01-16 ENCOUNTER — Inpatient Hospital Stay (HOSPITAL_COMMUNITY)
Admission: EM | Admit: 2015-01-16 | Discharge: 2015-01-19 | DRG: 802 | Disposition: A | Discharge status: Home / Self Care | Payer: Medicaid Other | Attending: Specialist | Admitting: Specialist

## 2015-01-16 ENCOUNTER — Emergency Department (HOSPITAL_COMMUNITY): Payer: Medicaid Other

## 2015-01-16 ENCOUNTER — Other Ambulatory Visit: Payer: Self-pay | Admitting: Specialist

## 2015-01-16 ENCOUNTER — Encounter (HOSPITAL_COMMUNITY): Payer: Self-pay

## 2015-01-16 DIAGNOSIS — Z9884 Bariatric surgery status: Secondary | ICD-10-CM

## 2015-01-16 DIAGNOSIS — K439 Ventral hernia without obstruction or gangrene: Secondary | ICD-10-CM | POA: Diagnosis present

## 2015-01-16 DIAGNOSIS — T8119XA Other postprocedural shock, initial encounter: Secondary | ICD-10-CM | POA: Diagnosis present

## 2015-01-16 DIAGNOSIS — D62 Acute posthemorrhagic anemia: Principal | ICD-10-CM | POA: Diagnosis present

## 2015-01-16 DIAGNOSIS — Z8673 Personal history of transient ischemic attack (TIA), and cerebral infarction without residual deficits: Secondary | ICD-10-CM | POA: Diagnosis not present

## 2015-01-16 DIAGNOSIS — K219 Gastro-esophageal reflux disease without esophagitis: Secondary | ICD-10-CM | POA: Diagnosis present

## 2015-01-16 DIAGNOSIS — E119 Type 2 diabetes mellitus without complications: Secondary | ICD-10-CM | POA: Diagnosis present

## 2015-01-16 DIAGNOSIS — R579 Shock, unspecified: Secondary | ICD-10-CM | POA: Diagnosis present

## 2015-01-16 DIAGNOSIS — R58 Hemorrhage, not elsewhere classified: Secondary | ICD-10-CM

## 2015-01-16 DIAGNOSIS — M5402 Panniculitis affecting regions of neck and back, cervical region: Secondary | ICD-10-CM | POA: Diagnosis present

## 2015-01-16 DIAGNOSIS — R578 Other shock: Secondary | ICD-10-CM

## 2015-01-16 DIAGNOSIS — Z79899 Other long term (current) drug therapy: Secondary | ICD-10-CM | POA: Diagnosis not present

## 2015-01-16 DIAGNOSIS — E65 Localized adiposity: Secondary | ICD-10-CM | POA: Diagnosis present

## 2015-01-16 LAB — BASIC METABOLIC PANEL
ANION GAP: 8 (ref 5–15)
BUN: 29 mg/dL — ABNORMAL HIGH (ref 6–20)
CO2: 26 mmol/L (ref 22–32)
Calcium: 8.5 mg/dL — ABNORMAL LOW (ref 8.9–10.3)
Chloride: 101 mmol/L (ref 101–111)
Creatinine, Ser: 2.45 mg/dL — ABNORMAL HIGH (ref 0.61–1.24)
GFR calc Af Amer: 35 mL/min — ABNORMAL LOW (ref >60)
GFR, EST NON AFRICAN AMERICAN: 30 mL/min — AB (ref >60)
Glucose, Bld: 242 mg/dL — ABNORMAL HIGH (ref 65–99)
POTASSIUM: 4.2 mmol/L (ref 3.5–5.1)
SODIUM: 135 mmol/L (ref 135–145)

## 2015-01-16 LAB — CBC WITH DIFFERENTIAL/PLATELET
BASOS PCT: 0 % (ref 0–1)
Basophils Absolute: 0 10*3/uL (ref 0.0–0.1)
EOS PCT: 0 % (ref 0–5)
Eosinophils Absolute: 0 10*3/uL (ref 0.0–0.7)
HEMATOCRIT: 31.4 % — AB (ref 39.0–52.0)
HEMOGLOBIN: 10.7 g/dL — AB (ref 13.0–17.0)
Lymphocytes Relative: 9 % — ABNORMAL LOW (ref 12–46)
Lymphs Abs: 2.2 10*3/uL (ref 0.7–4.0)
MCH: 31 pg (ref 26.0–34.0)
MCHC: 34.1 g/dL (ref 30.0–36.0)
MCV: 91 fL (ref 78.0–100.0)
MONO ABS: 2 10*3/uL — AB (ref 0.1–1.0)
MONOS PCT: 8 % (ref 3–12)
NEUTROS PCT: 83 % — AB (ref 43–77)
Neutro Abs: 20.5 10*3/uL — ABNORMAL HIGH (ref 1.7–7.7)
Platelets: 211 10*3/uL (ref 150–400)
RBC: 3.45 MIL/uL — ABNORMAL LOW (ref 4.22–5.81)
RDW: 12.6 % (ref 11.5–15.5)
WBC: 24.7 10*3/uL — ABNORMAL HIGH (ref 4.0–10.5)

## 2015-01-16 LAB — PREPARE RBC (CROSSMATCH)

## 2015-01-16 LAB — PROTIME-INR
INR: 1.18 (ref 0.00–1.49)
PROTHROMBIN TIME: 15.2 s (ref 11.6–15.2)

## 2015-01-16 LAB — I-STAT CG4 LACTIC ACID, ED
LACTIC ACID, VENOUS: 3.23 mmol/L — AB (ref 0.5–2.0)
LACTIC ACID, VENOUS: 2.14 mmol/L — AB (ref 0.5–2.0)

## 2015-01-16 LAB — ABO/RH: ABO/RH(D): O POS

## 2015-01-16 LAB — I-STAT CHEM 8, ED
BUN: 31 mg/dL — ABNORMAL HIGH (ref 6–20)
CALCIUM ION: 1.17 mmol/L (ref 1.12–1.23)
CHLORIDE: 100 mmol/L — AB (ref 101–111)
Creatinine, Ser: 2.2 mg/dL — ABNORMAL HIGH (ref 0.61–1.24)
Glucose, Bld: 243 mg/dL — ABNORMAL HIGH (ref 65–99)
HCT: 32 % — ABNORMAL LOW (ref 39.0–52.0)
HEMOGLOBIN: 10.9 g/dL — AB (ref 13.0–17.0)
POTASSIUM: 4.2 mmol/L (ref 3.5–5.1)
SODIUM: 137 mmol/L (ref 135–145)
TCO2: 23 mmol/L (ref 0–100)

## 2015-01-16 LAB — APTT: aPTT: 32 seconds (ref 24–37)

## 2015-01-16 MED ORDER — CEFAZOLIN SODIUM 1-5 GM-% IV SOLN
1.0000 g | Freq: Three times a day (TID) | INTRAVENOUS | Status: DC
  Administered 2015-01-16 – 2015-01-19 (×9): 1 g via INTRAVENOUS
  Filled 2015-01-16 (×12): qty 50

## 2015-01-16 MED ORDER — SODIUM CHLORIDE 0.9 % IV SOLN
INTRAVENOUS | Status: AC
  Administered 2015-01-16: 11:00:00 via INTRAVENOUS

## 2015-01-16 MED ORDER — ONDANSETRON HCL 4 MG/2ML IJ SOLN: 4.0000 mg | Freq: Four times a day (QID) | INTRAMUSCULAR | Status: DC | PRN

## 2015-01-16 MED ORDER — HYDROCODONE-ACETAMINOPHEN 5-325 MG PO TABS
1.0000 | ORAL_TABLET | ORAL | Status: DC | PRN
  Administered 2015-01-16 – 2015-01-18 (×5): 2 via ORAL
  Filled 2015-01-16 (×5): qty 2

## 2015-01-16 MED ORDER — ONDANSETRON 4 MG PO TBDP: 4.0000 mg | ORAL_TABLET | Freq: Four times a day (QID) | ORAL | Status: DC | PRN

## 2015-01-16 MED ORDER — DEXTROSE IN LACTATED RINGERS 5 % IV SOLN
INTRAVENOUS | Status: DC
  Administered 2015-01-16 – 2015-01-19 (×5): via INTRAVENOUS

## 2015-01-16 MED ORDER — MORPHINE SULFATE (PF) 2 MG/ML IV SOLN: 2.0000 mg | INTRAVENOUS | Status: DC | PRN

## 2015-01-16 MED ORDER — SODIUM CHLORIDE 0.9 % IV BOLUS (SEPSIS)
2000.0000 mL | Freq: Once | INTRAVENOUS | Status: AC
  Administered 2015-01-16: 2000 mL via INTRAVENOUS

## 2015-01-16 NOTE — ED Notes (Signed)
Pt taken to ct by this RN

## 2015-01-16 NOTE — ED Notes (Signed)
Pt right lower abdominal surgical incision steadily draining bright red blood, ABD pads placed by this RN as well as Statistician to attempt to control bleeding. Pt has transported to CT at this time with [facility] RN transporting with pt to monitor.

## 2015-01-16 NOTE — ED Provider Notes (Addendum)
CSN: [id number]     Arrival date & time 01/16/15  0703 History   First MD Initiated Contact with Patient 01/16/15 [phone]     Chief Complaint  Patient presents with  . Post-op Problem     (Consider location/radiation/quality/duration/timing/severity/associated sxs/prior Treatment) Patient is a 44 y.o. male presenting with general illness. The history is provided by the patient.  Illness Severity:  Severe Onset quality:  Sudden Duration:  2 hours Timing:  Constant Progression:  Unchanged Chronicity:  New Associated symptoms: fatigue   Associated symptoms: no abdominal pain, no chest pain, no congestion, no diarrhea, no fever, no headaches, no myalgias, no rash, no shortness of breath and no vomiting    44 yo M with a chief complaint of bleeding from incision site. Patient had panniculectomy yesterday patient had been doing okay until about 4 hours ago. Patient woke up and noted a big gush of blood from the left side of his incision. Patient has had to empty his Hemovacs 3 times over the course the morning. Every time he stands that feels like he set up half out. Patient denies chest pain shortness breath fevers. Having some lower incisional pain.  Past Medical History  Diagnosis Date  . GERD (gastroesophageal reflux disease)   . Colon polyps   . PONV (postoperative nausea and vomiting)   . Dysrhythmia     s/p surgery bariatric- cardioversion  . Sleep apnea     no longer have to use cpap since wt loss  . Depression     denies  . Diabetes mellitus     diet controlled- no meds since wt loss  . Stroke 2006    denies residual on 01/15/2015  . Arthritis     "hips" (01/15/2015)  . History of gout   . Chronic lower back pain    Past Surgical History  Procedure Laterality Date  . Bariatric surgery  2010    in [place]  . Colonoscopy N/A 08/02/2014    Procedure: COLONOSCOPY;  Surgeon: Rogene Houston, MD;  [facility];  Service: Endoscopy;  Laterality: N/A;  210 - moved to  3/10 @ 11:55 - Ann notified pt  . Cardioversion    . Esophagogastroduodenoscopy    . Tonsillectomy    . Panniculectomy  01/14/2015  . Ventral hernia repair  01/14/2015  . Hernia repair    . Panniculectomy N/A 01/14/2015    Procedure:  TOTAL PANNICULECTOMY WTH LIPOSUCTION OF SIDES;  Surgeon: Cristine Polio, MD;  Location: [name];  Service: Plastics;  Laterality: N/A;  . Ventral hernia repair N/A 01/14/2015    Procedure: HERNIA REPAIR VENTRAL ADULT AND MUSCLE REPAIR;  Surgeon: Cristine Polio, MD;  Location: [facility];  Service: Plastics;  Laterality: N/A;  . Liposuction     No family history on file. Social History  Substance Use Topics  . Smoking status: Never Smoker   . Smokeless tobacco: Never Used  . Alcohol Use: No    Review of Systems  Constitutional: Positive for fatigue. Negative for fever and chills.  HENT: Negative for congestion and facial swelling.   Eyes: Negative for discharge and visual disturbance.  Respiratory: Negative for shortness of breath.   Cardiovascular: Negative for chest pain and palpitations.  Gastrointestinal: Negative for vomiting, abdominal pain and diarrhea.  Musculoskeletal: Negative for myalgias and arthralgias.  Skin: Negative for color change and rash.  Neurological: Positive for syncope (near) and weakness. Negative for tremors and headaches.  Psychiatric/Behavioral: Negative for confusion and dysphoric mood.  Allergies  Review of patient's allergies indicates no known allergies.  Home Medications   Prior to Admission medications   Medication Sig Start Date End Date Taking? Authorizing Provider  ALPRAZolam [name] Moron) 1 MG tablet Take 1 mg by mouth at bedtime as needed for anxiety.   Yes Historical Provider, MD  latanoprost (XALATAN) 0.005 % ophthalmic solution Place 1 drop into both eyes at bedtime.   Yes Historical Provider, MD  Multiple Vitamins-Minerals (MULTIVITAMIN WITH MINERALS) tablet Take 1 tablet by mouth daily.   Yes Historical  Provider, MD  omeprazole (PRILOSEC) 20 MG capsule Take 20 mg by mouth 2 (two) times daily before a meal.   Yes Historical Provider, MD  Oxycodone HCl 20 MG TABS Take 20 mg by mouth every 4 (four) hours. pain   Yes Historical Provider, MD  timolol (BETIMOL) 0.5 % ophthalmic solution Apply 1 drop to eye daily.   Yes Historical Provider, MD  tiZANidine (ZANAFLEX) 4 MG tablet Take 4 mg by mouth every 8 (eight) hours as needed for muscle spasms.   Yes Historical Provider, MD   BP 102/51 mmHg  Pulse 71  Temp(Src) 98.3 F (36.8 C) (Oral)  Resp 14  Ht 5\' 8"  (1.727 m)  Wt 243 lb (110.224 kg)  BMI 36.96 kg/m2  SpO2 100% Physical Exam  Constitutional: He is oriented to person, place, and time. He appears well-developed and well-nourished.  HENT:  Head: Normocephalic and atraumatic.  Eyes: EOM are normal. Pupils are equal, round, and reactive to light.  Neck: Normal range of motion. Neck supple. No JVD present.  Cardiovascular: Normal rate and regular rhythm.  Exam reveals no gallop and no friction rub.   No murmur heard. Pulmonary/Chest: No respiratory distress. He has no wheezes. He has no rales.  Abdominal: He exhibits no distension. There is tenderness. There is no rebound and no guarding.  Large abdominal incision to the lower part of the abdomen. With pressure bloody discharge from the left lower quadrant. Patient with 2 Hemovacs in place  Musculoskeletal: Normal range of motion. He exhibits no edema or tenderness.  Neurological: He is alert and oriented to person, place, and time.  Skin: No rash noted. No pallor.  Psychiatric: He has a normal mood and affect. His behavior is normal.    ED Course  Procedures (including critical care time) Labs Review Labs Reviewed  CBC WITH DIFFERENTIAL/PLATELET - Abnormal; Notable for the following:    WBC 24.7 (*)    RBC 3.45 (*)    Hemoglobin 10.7 (*)    HCT 31.4 (*)    Neutrophils Relative % 83 (*)    Lymphocytes Relative 9 (*)    Neutro Abs  20.5 (*)    Monocytes Absolute 2.0 (*)    All other components within normal limits  BASIC METABOLIC PANEL - Abnormal; Notable for the following:    Glucose, Bld 242 (*)    BUN 29 (*)    Creatinine, Ser 2.45 (*)    Calcium 8.5 (*)    GFR calc non Af Amer 30 (*)    GFR calc Af Amer 35 (*)    All other components within normal limits  I-STAT CHEM 8, ED - Abnormal; Notable for the following:    Chloride 100 (*)    BUN 31 (*)    Creatinine, Ser 2.20 (*)    Glucose, Bld 243 (*)    Hemoglobin 10.9 (*)    HCT 32.0 (*)    All other components within normal limits  I-STAT CG4 LACTIC ACID, ED - Abnormal; Notable for the following:    Lactic Acid, Venous 3.23 (*)    All other components within normal limits  I-STAT CG4 LACTIC ACID, ED - Abnormal; Notable for the following:    Lactic Acid, Venous 2.14 (*)    All other components within normal limits  PROTIME-INR  APTT  TYPE AND SCREEN  PREPARE RBC (CROSSMATCH)  ABO/RH    Imaging Review Ct Abdomen Pelvis Wo Contrast  01/16/2015   CLINICAL DATA:  Panniculectomy with bleeding per drainage tube and incision. Hypotension.  EXAM: CT ABDOMEN AND PELVIS WITHOUT CONTRAST  TECHNIQUE: Multidetector CT imaging of the abdomen and pelvis was performed following the standard protocol without IV contrast.  COMPARISON:  None.  FINDINGS: Lower chest:  Minimal dependent bibasilar atelectasis is present.  Hepatobiliary: Hepatic arterial calcifications are noted. No hepatic mass is identified allowing for lack of contrast. No intrahepatic ductal dilatation. Gallbladder is unremarkable.  Pancreas: Normal  Spleen: Normal, with splenule identified.  Adrenals/Urinary Tract: Vascular calcifications are noted. Adrenal glands and unenhanced kidneys are otherwise unremarkable.  Stomach/Bowel: Evidence of gastric bypass noted without complicating feature. Rim calcified area of presumed remote omental infarct or sequela of epiploic appendagitis incidentally noted adjacent to  the transverse colon, image 22, without suspicious feature. No bowel wall thickening or focal segmental dilatation is identified.  Vascular/Lymphatic: Vascular calcification noted. No aortic aneurysm. No lymphadenopathy.  Other: Postsurgical change status post panniculectomy is noted with mixed density internally high attenuation fluid within the left antral lateral abdominal soft tissues, measuring 23.8 x 7.8 x 3.7 cm. Surgical staples and drainage catheters are in place. Subcutaneous gas is noted with diffuse stranding.  No intra-abdominal fluid or air.  Musculoskeletal: Degenerative change noted in both hips.  IMPRESSION: 23.8 cm left lower quadrant antral lateral subcutaneous fluid collection with internal high-density suggesting clot/ blood product. This compatible with postsurgical change but active bleeding could be present given the relative high density internally but cannot be further assessed without contrast.  These results were called by telephone at the time of interpretation on 01/16/2015 at 8:30 am to Dr. Deno Etienne , who verbally acknowledged these results.   Electronically Signed   By: Conchita Paris M.D.   On: 01/16/2015 08:51   I have personally reviewed and evaluated these images and lab results as part of my medical decision-making.   EKG Interpretation None      MDM   Final diagnoses:  Acute blood loss anemia  Hemorrhagic shock    44 yo M with a chief complaints of bleeding from his abdomen. Patient hypertensive on arrival to the ED initial blood pressure in the 60 systolic. Given 2 L of fluid. We'll check a CBC obtain a CT scan of abdomen and pelvis to evaluate for size of hematoma likely need to contact the surgeon.  Spoke with Dr. Towanda Malkin, agrees to admission.  Will place bed request.  Due to hypotension, will place step down order.  1G hgb drop though with acute hypotension and having filled hemovac x3 this morning(400cc x2), will give 2 U of blood.  CT scan images  viewed and discussed with radiology, no contrast given due to AKI.  Elevated LA, will repeat.    CRITICAL CARE Performed by: Cecilio Asper   Total critical care time: 97min   Critical care time was exclusive of separately billable procedures and treating other patients.  Critical care was necessary to treat or prevent imminent or life-threatening deterioration.  Critical care was time spent personally by me on the following activities: development of treatment plan with patient and/or surrogate as well as nursing, discussions with consultants, evaluation of patient's response to treatment, examination of patient, obtaining history from patient or surrogate, ordering and performing treatments and interventions, ordering and review of laboratory studies, ordering and review of radiographic studies, pulse oximetry and re-evaluation of patient's condition.  The patients results and plan were reviewed and discussed.   Any x-rays performed were independently reviewed by myself.   Differential diagnosis were considered with the presenting HPI.  Medications  0.9 %  sodium chloride infusion ( Intravenous New Bag/Given 01/16/15 1040)  sodium chloride 0.9 % bolus 2,000 mL (0 mLs Intravenous Stopped 01/16/15 0854)    Filed Vitals:   01/16/15 1415 01/16/15 1500 01/16/15 1545 01/16/15 1615  BP: 101/53 110/47 105/51 102/51  Pulse: 76 72 70 71  Temp:      TempSrc:      Resp: 18 19 13 14   Height:      Weight:      SpO2: 100% 100% 100% 100%    Final diagnoses:  Acute blood loss anemia  Hemorrhagic shock    Admission/ observation were discussed with the admitting physician, patient and/or family and they are comfortable with the plan.    Deno Etienne, DO 01/16/15 [place], DO 01/16/15 9628

## 2015-01-16 NOTE — ED Notes (Signed)
Pt comes from home via [facility], pt had 25lbs of lipo here on Monday, left hospital yesterday, lost of bleeding from incision. Pt hypotensive upon EMS arrival. Pt feels weak.

## 2015-01-16 NOTE — Discharge Summary (Signed)
NAMEPAYDEN, DOCTER NO.:  [id number]  MEDICAL RECORD NO.:  [id number]  LOCATION:  6N32C                        FACILITY:  [facility]  PHYSICIAN:  Berneta Sages L. Towanda Malkin, M.D.DATE OF BIRTH:  03/31/1971  DATE OF ADMISSION:  01/14/2015 DATE OF DISCHARGE:  01/15/2015                              DISCHARGE SUMMARY   HOSPITAL COURSE:  The patient was admitted for removal of severe panniculus of the body.  The patient has lost over 250 pounds over the last couple of years and has severe panniculitis and large ventral hernia, both repaired on January 14, 2015.  Postop, he has done very very well,  and he has been discharged from the hospital on January 15, 2015.  CONDITION AT DISCHARGE:  Improved.  MEDICATIONS:  Keflex 500 mg twice a day for 5 more days and hydrocodone 10 mg 1 tab by mouth every 3-4 hours as needed for pain #30.  COMPLICATIONS:  None.  DIAGNOSES POSTOP: 1. Removal of severe panniculus of the abdomen. 2. Repair of large ventral hernia.  CONDITION AT DISCHARGE:  Improved.  COMPLICATIONS:  None.     [name]. Towanda Malkin, M.D.     Elie Confer  D:  01/15/2015  T:  01/16/2015  Job:  [id number]

## 2015-01-16 NOTE — Discharge Summary (Deleted)
NAMECLYDELL, SPOSITO NO.:  [id number]  MEDICAL RECORD NO.:  [id number]  LOCATION:  6N32C                        FACILITY:  [facility]  PHYSICIAN:  Berneta Sages L. Towanda Malkin, M.D.DATE OF BIRTH:  [birth year]  DATE OF ADMISSION:  01/14/2015 DATE OF DISCHARGE:  01/15/2015                              DISCHARGE SUMMARY   HOSPITAL COURSE:  The patient was admitted for removal of severe panniculus of the body.  The patient has lost over 250 pounds over the last couple of years and has severe panniculitis and large ventral hernia, both repaired on January 14, 2015.  Postop, he has done very very well,  and he has been discharged from the hospital on January 15, 2015.  CONDITION AT DISCHARGE:  Improved.  MEDICATIONS:  Keflex 500 mg twice a day for 5 more days and hydrocodone 10 mg 1 tab by mouth every 3-4 hours as needed for pain #30.  COMPLICATIONS:  None.  DIAGNOSES POSTOP: 1. Removal of severe panniculus of the abdomen. 2. Repair of large ventral hernia.  CONDITION AT DISCHARGE:  Improved.  COMPLICATIONS:  None.     Odella Aquas. Towanda Malkin, M.D.     Elie Confer  D:  01/15/2015  T:  01/16/2015  Job:  [id number]

## 2015-01-16 NOTE — H&P (Signed)
[name] is an 44 y.o. male.   Chief Complaint:Post op severe panniculectomy and ventral hernia repair with bleeding. HPI: Surgery done 2 days ago and had uneventful post op course  Was admitted overnight after surgery done on 8 23 2016.  Had min to  Moderate drainage from drainage tubes post op. Did well at home until last evening when he stood up and a large amount of hematoma and tumescent evacuated.me to ERfor evaluation.  Past Medical History  Diagnosis Date  . GERD (gastroesophageal reflux disease)   . Colon polyps   . PONV (postoperative nausea and vomiting)   . Dysrhythmia     s/p surgery bariatric- cardioversion  . Sleep apnea     no longer have to use cpap since wt loss  . Depression     denies  . Diabetes mellitus     diet controlled- no meds since wt loss  . Stroke 2006    denies residual on 01/15/2015  . Arthritis     "hips" (01/15/2015)  . History of gout   . Chronic lower back pain     Past Surgical History  Procedure Laterality Date  . Bariatric surgery  2010    in [place]  . Colonoscopy N/A 08/02/2014    Procedure: COLONOSCOPY;  Surgeon: Rogene Houston, MD;  [facility];  Service: Endoscopy;  Laterality: N/A;  210 - moved to 3/10 @ 11:55 - Ann notified pt  . Cardioversion    . Esophagogastroduodenoscopy    . Tonsillectomy    . Panniculectomy  01/14/2015  . Ventral hernia repair  01/14/2015  . Hernia repair    . Panniculectomy N/A 01/14/2015    Procedure:  TOTAL PANNICULECTOMY WTH LIPOSUCTION OF SIDES;  Surgeon: Cristine Polio, MD;  Location: [facility];  Service: Plastics;  Laterality: N/A;  . Ventral hernia repair N/A 01/14/2015    Procedure: HERNIA REPAIR VENTRAL ADULT AND MUSCLE REPAIR;  Surgeon: Cristine Polio, MD;  Location: [facility];  Service: Plastics;  Laterality: N/A;  . Liposuction      No family history on file. Social History:  reports that he has never smoked. He has never used smokeless tobacco. He reports that he does not drink  alcohol or use illicit drugs.  Allergies: No Known Allergies   (Not in a hospital admission)  Results for orders placed or performed during the hospital encounter of 01/16/15 (from the past 48 hour(s))  CBC with Differential     Status: Abnormal   Collection Time: 01/16/15  7:30 AM  Result Value Ref Range   WBC 24.7 (H) 4.0 - 10.5 K/uL   RBC 3.45 (L) 4.22 - 5.81 MIL/uL   Hemoglobin 10.7 (L) 13.0 - 17.0 g/dL   HCT 31.4 (L) 39.0 - 52.0 %   MCV 91.0 78.0 - 100.0 fL   MCH 31.0 26.0 - 34.0 pg   MCHC 34.1 30.0 - 36.0 g/dL   RDW 12.6 11.5 - 15.5 %   Platelets 211 150 - 400 K/uL   Neutrophils Relative % 83 (H) 43 - 77 %   Lymphocytes Relative 9 (L) 12 - 46 %   Monocytes Relative 8 3 - 12 %   Eosinophils Relative 0 0 - 5 %   Basophils Relative 0 0 - 1 %   Neutro Abs 20.5 (H) 1.7 - 7.7 K/uL   Lymphs Abs 2.2 0.7 - 4.0 K/uL   Monocytes Absolute 2.0 (H) 0.1 - 1.0 K/uL   Eosinophils Absolute 0.0 0.0 -  0.7 K/uL   Basophils Absolute 0.0 0.0 - 0.1 K/uL   WBC Morphology TOXIC GRANULATION   Type and screen for Red Blood Exchange     Status: None (Preliminary result)   Collection Time: 01/16/15  7:30 AM  Result Value Ref Range   ABO/RH(D) O POS    Antibody Screen NEG    Sample Expiration 01/19/2015    Unit Number [id number]    Blood Component Type RBC LR PHER2    Unit division 00    Status of Unit ISSUED    Transfusion Status OK TO TRANSFUSE    Crossmatch Result Compatible    Unit Number [id number]    Blood Component Type RED CELLS,LR    Unit division 00    Status of Unit ISSUED    Transfusion Status OK TO TRANSFUSE    Crossmatch Result Compatible   Basic metabolic panel     Status: Abnormal   Collection Time: 01/16/15  7:30 AM  Result Value Ref Range   Sodium 135 135 - 145 mmol/L   Potassium 4.2 3.5 - 5.1 mmol/L   Chloride 101 101 - 111 mmol/L   CO2 26 22 - 32 mmol/L   Glucose, Bld 242 (H) 65 - 99 mg/dL   BUN 29 (H) 6 - 20 mg/dL   Creatinine, Ser 2.45 (H) 0.61 - 1.24  mg/dL   Calcium 8.5 (L) 8.9 - 10.3 mg/dL   GFR calc non Af Amer 30 (L) >60 mL/min   GFR calc Af Amer 35 (L) >60 mL/min    Comment: (NOTE) The eGFR has been calculated using the CKD EPI equation. This calculation has not been validated in all clinical situations. eGFR's persistently <60 mL/min signify possible Chronic Kidney Disease.    Anion gap 8 5 - 15  ABO/Rh     Status: None   Collection Time: 01/16/15  7:30 AM  Result Value Ref Range   ABO/RH(D) O POS   Protime-INR     Status: None   Collection Time: 01/16/15  7:30 AM  Result Value Ref Range   Prothrombin Time 15.2 11.6 - 15.2 seconds   INR 1.18 0.00 - 1.49  APTT     Status: None   Collection Time: 01/16/15  7:30 AM  Result Value Ref Range   aPTT 32 24 - 37 seconds  I-Stat Chem 8, ED     Status: Abnormal   Collection Time: 01/16/15  7:47 AM  Result Value Ref Range   Sodium 137 135 - 145 mmol/L   Potassium 4.2 3.5 - 5.1 mmol/L   Chloride 100 (L) 101 - 111 mmol/L   BUN 31 (H) 6 - 20 mg/dL   Creatinine, Ser 2.20 (H) 0.61 - 1.24 mg/dL   Glucose, Bld 243 (H) 65 - 99 mg/dL   Calcium, Ion 1.17 1.12 - 1.23 mmol/L   TCO2 23 0 - 100 mmol/L   Hemoglobin 10.9 (L) 13.0 - 17.0 g/dL   HCT 32.0 (L) 39.0 - 52.0 %  I-Stat CG4 Lactic Acid, ED     Status: Abnormal   Collection Time: 01/16/15  8:10 AM  Result Value Ref Range   Lactic Acid, Venous 3.23 (HH) 0.5 - 2.0 mmol/L   Comment NOTIFIED PHYSICIAN   Prepare RBC     Status: None   Collection Time: 01/16/15  8:29 AM  Result Value Ref Range   Order Confirmation ORDER PROCESSED BY BLOOD BANK    Ct Abdomen Pelvis Wo Contrast  01/16/2015   CLINICAL DATA:  Panniculectomy with bleeding per drainage tube and incision. Hypotension.  EXAM: CT ABDOMEN AND PELVIS WITHOUT CONTRAST  TECHNIQUE: Multidetector CT imaging of the abdomen and pelvis was performed following the standard protocol without IV contrast.  COMPARISON:  None.  FINDINGS: Lower chest:  Minimal dependent bibasilar atelectasis is  present.  Hepatobiliary: Hepatic arterial calcifications are noted. No hepatic mass is identified allowing for lack of contrast. No intrahepatic ductal dilatation. Gallbladder is unremarkable.  Pancreas: Normal  Spleen: Normal, with splenule identified.  Adrenals/Urinary Tract: Vascular calcifications are noted. Adrenal glands and unenhanced kidneys are otherwise unremarkable.  Stomach/Bowel: Evidence of gastric bypass noted without complicating feature. Rim calcified area of presumed remote omental infarct or sequela of epiploic appendagitis incidentally noted adjacent to the transverse colon, image 22, without suspicious feature. No bowel wall thickening or focal segmental dilatation is identified.  Vascular/Lymphatic: Vascular calcification noted. No aortic aneurysm. No lymphadenopathy.  Other: Postsurgical change status post panniculectomy is noted with mixed density internally high attenuation fluid within the left antral lateral abdominal soft tissues, measuring 23.8 x 7.8 x 3.7 cm. Surgical staples and drainage catheters are in place. Subcutaneous gas is noted with diffuse stranding.  No intra-abdominal fluid or air.  Musculoskeletal: Degenerative change noted in both hips.  IMPRESSION: 23.8 cm left lower quadrant antral lateral subcutaneous fluid collection with internal high-density suggesting clot/ blood product. This compatible with postsurgical change but active bleeding could be present given the relative high density internally but cannot be further assessed without contrast.  These results were called by telephone at the time of interpretation on 01/16/2015 at 8:30 am to Dr. Deno Etienne , who verbally acknowledged these results.   Electronically Signed   By: Conchita Paris M.D.   On: 01/16/2015 08:51    Review of Systems  HENT: Negative.   Eyes: Negative.   Respiratory: Negative.   Gastrointestinal: Negative.   Genitourinary: Negative.   Musculoskeletal: Negative.   Skin: Negative.    Neurological: Positive for weakness.  Endo/Heme/Allergies: Negative.   Psychiatric/Behavioral: Negative.     There were no vitals taken for this visit. Physical Exam   Assessment/Plan Post op bleeding   Hemato with residual tumescent  formation which exited thru wound    Belly is flat and no active bleeding   Will admit for observation and evaluate routinely.  Myrth Dahan L 01/16/2015, 12:08 PM

## 2015-01-16 NOTE — Discharge Summary (Deleted)
[name], [name] NO.:  [id number]  MEDICAL RECORD NO.:  [id number]  LOCATION:                               FACILITY:  [facility]  PHYSICIAN:  Berneta Sages L. Towanda Malkin, M.D.DATE OF BIRTH:  1971/04/23  DATE OF ADMISSION:  01/16/2015 DATE OF DISCHARGE:  01/19/2015                              DISCHARGE SUMMARY   HOSPITAL COURSE:  The patient was admitted for removal of severe panniculus of the body.  The patient has lost over 250 pounds over the last couple of years and has severe panniculitis and large ventral hernia, both repaired on January 14, 2015.  Postop, he has done very very well,  and he has been discharged from the hospital on January 15, 2015.  CONDITION AT DISCHARGE:  Improved.  MEDICATIONS:  Keflex 500 mg twice a day for 5 more days and hydrocodone 10 mg 1 tab by mouth every 3-4 hours as needed for pain #30.  COMPLICATIONS:  None.  DIAGNOSES POSTOP: 1. Removal of severe panniculus of the abdomen. 2. Repair of large ventral hernia.  CONDITION AT DISCHARGE:  Improved.  COMPLICATIONS:  None.     Odella Aquas. Towanda Malkin, M.D.     Elie Confer  D:  01/15/2015  T:  01/16/2015  Job:  [id number]

## 2015-01-16 NOTE — ED Notes (Signed)
Ordered regular diet lunch tray for pt.

## 2015-01-16 NOTE — ED Notes (Signed)
Pts chucks pad had become mildly soiled with blood. Chucks pad changed.

## 2015-01-17 LAB — BASIC METABOLIC PANEL
Anion gap: 6 (ref 5–15)
BUN: 27 mg/dL — AB (ref 6–20)
CHLORIDE: 108 mmol/L (ref 101–111)
CO2: 25 mmol/L (ref 22–32)
CREATININE: 1.54 mg/dL — AB (ref 0.61–1.24)
Calcium: 7.8 mg/dL — ABNORMAL LOW (ref 8.9–10.3)
GFR calc Af Amer: >60 mL/min (ref >60)
GFR, EST NON AFRICAN AMERICAN: 53 mL/min — AB (ref >60)
GLUCOSE: 137 mg/dL — AB (ref 65–99)
Potassium: 4.1 mmol/L (ref 3.5–5.1)
SODIUM: 139 mmol/L (ref 135–145)

## 2015-01-17 LAB — CBC
HCT: 23.6 % — ABNORMAL LOW (ref 39.0–52.0)
Hemoglobin: 8.2 g/dL — ABNORMAL LOW (ref 13.0–17.0)
MCH: 30.8 pg (ref 26.0–34.0)
MCHC: 34.7 g/dL (ref 30.0–36.0)
MCV: 88.7 fL (ref 78.0–100.0)
PLATELETS: 120 10*3/uL — AB (ref 150–400)
RBC: 2.66 MIL/uL — ABNORMAL LOW (ref 4.22–5.81)
RDW: 13.2 % (ref 11.5–15.5)
WBC: 9.5 10*3/uL (ref 4.0–10.5)

## 2015-01-17 LAB — TYPE AND SCREEN
ABO/RH(D): O POS
ANTIBODY SCREEN: NEGATIVE
UNIT DIVISION: 0
Unit division: 0

## 2015-01-18 LAB — BASIC METABOLIC PANEL
Anion gap: 4 — ABNORMAL LOW (ref 5–15)
BUN: 17 mg/dL (ref 6–20)
CALCIUM: 8 mg/dL — AB (ref 8.9–10.3)
CO2: 27 mmol/L (ref 22–32)
CREATININE: 1.23 mg/dL (ref 0.61–1.24)
Chloride: 106 mmol/L (ref 101–111)
GFR calc non Af Amer: >60 mL/min (ref >60)
Glucose, Bld: 133 mg/dL — ABNORMAL HIGH (ref 65–99)
Potassium: 4.2 mmol/L (ref 3.5–5.1)
SODIUM: 137 mmol/L (ref 135–145)

## 2015-01-18 LAB — CBC
HCT: 23.8 % — ABNORMAL LOW (ref 39.0–52.0)
Hemoglobin: 8.4 g/dL — ABNORMAL LOW (ref 13.0–17.0)
MCH: 31 pg (ref 26.0–34.0)
MCHC: 35.3 g/dL (ref 30.0–36.0)
MCV: 87.8 fL (ref 78.0–100.0)
PLATELETS: 132 10*3/uL — AB (ref 150–400)
RBC: 2.71 MIL/uL — AB (ref 4.22–5.81)
RDW: 12.9 % (ref 11.5–15.5)
WBC: 8.5 10*3/uL (ref 4.0–10.5)

## 2015-01-18 NOTE — Progress Notes (Signed)
I changed his dressings twice during the night, still having a lot of drainage.

## 2015-01-19 ENCOUNTER — Other Ambulatory Visit: Payer: Self-pay | Admitting: Specialist

## 2015-01-19 NOTE — Progress Notes (Signed)
Unable to print out AVS for pt DC because meds not reconciled.  Dr. Towanda Malkin called and notified.  Obtained order to continue the pt home meds with the exception of any ASA products.  Pt is to cont taking his hydrocodone and keflex as before from the original surgery.  He is to take a dulcolax suppository once at home.  Pt understands all of the instructions and feels OK about going home.  Supplies given to pt and family.

## 2015-01-19 NOTE — Progress Notes (Signed)
Pt for DC with family.  Pt fully understands home self care and follow up appt with Dr. Towanda Malkin.  Supplies sent home with pt.  Kerlix and Ace wrapped loosely around mid abd.

## 2015-01-19 NOTE — Progress Notes (Signed)
8 26 2016  Patient examined and wounds are healing well. There is still serous drainage from the right and left lateral abdominal ares of the wound which were left slightly open on purpose. The patyient is afebrile and is eating well. He is afebrile.  Will see on tomorrow for possible dischsrge' Plan;  Increase ambulation increase appetite. Patint agin seen on Sat 8 27 16. He is doing well and drainage is decreasing from the lateral edges of the abdominal wounds. He is to increase ambulation today family will be shown how to change dressings and monitor. Condition at discharge  Improved  SP excision of a severe panniculus with ventral hernia repair with post op bleeding and seroma. Meds; keflex 500 mg by mouth every 12 hrs  hydrocodione 1 tab by mouth every 2 to 3 hrs as needed  Patient is to call me for any problems and see me in followup on next Wed  He is to call the office for the appointment.

## 2015-01-19 NOTE — H&P (Addendum)
[name] is an 44 y.o. male.   Chief Complaint: Sp severe panniculectomy on 8 22 2016   HPI: Had post op  Bleeding and seroma two days later  Past Medical History  Diagnosis Date  . GERD (gastroesophageal reflux disease)   . Colon polyps   . PONV (postoperative nausea and vomiting)   . Dysrhythmia     s/p surgery bariatric- cardioversion  . Sleep apnea     no longer have to use cpap since wt loss  . Depression     denies  . Diabetes mellitus     diet controlled- no meds since wt loss  . Stroke 2006    denies residual on 01/15/2015  . Arthritis     "hips" (01/15/2015)  . History of gout   . Chronic lower back pain     Past Surgical History  Procedure Laterality Date  . Bariatric surgery  2010    in [place]  . Colonoscopy N/A 08/02/2014    Procedure: COLONOSCOPY;  Surgeon: Rogene Houston, MD;  [facility];  Service: Endoscopy;  Laterality: N/A;  210 - moved to 3/10 @ 11:55 - Ann notified pt  . Cardioversion    . Esophagogastroduodenoscopy    . Tonsillectomy    . Panniculectomy  01/14/2015  . Ventral hernia repair  01/14/2015  . Hernia repair    . Panniculectomy N/A 01/14/2015    Procedure:  TOTAL PANNICULECTOMY WTH LIPOSUCTION OF SIDES;  Surgeon: Cristine Polio, MD;  Location: [facility];  Service: Plastics;  Laterality: N/A;  . Ventral hernia repair N/A 01/14/2015    Procedure: HERNIA REPAIR VENTRAL ADULT AND MUSCLE REPAIR;  Surgeon: Cristine Polio, MD;  Location: [facility] [facility];  Service: Plastics;  Laterality: N/A;  . Liposuction      No family history on file. Social History:  reports that he has never smoked. He has never used smokeless tobacco. He reports that he does not drink alcohol or use illicit drugs.  Allergies: No Known Allergies   (Not in a hospital admission)  Results for orders placed or performed during the hospital encounter of 01/16/15 (from the past 48 hour(s))  Basic metabolic panel     Status: Abnormal   Collection Time: 01/18/15  3:35 AM   Result Value Ref Range   Sodium 137 135 - 145 mmol/L   Potassium 4.2 3.5 - 5.1 mmol/L   Chloride 106 101 - 111 mmol/L   CO2 27 22 - 32 mmol/L   Glucose, Bld 133 (H) 65 - 99 mg/dL   BUN 17 6 - 20 mg/dL   Creatinine, Ser 1.23 0.61 - 1.24 mg/dL   Calcium 8.0 (L) 8.9 - 10.3 mg/dL   GFR calc non Af Amer >60 >60 mL/min   GFR calc Af Amer >60 >60 mL/min    Comment: (NOTE) The eGFR has been calculated using the CKD EPI equation. This calculation has not been validated in all clinical situations. eGFR's persistently <60 mL/min signify possible Chronic Kidney Disease.    Anion gap 4 (L) 5 - 15  CBC     Status: Abnormal   Collection Time: 01/18/15  3:35 AM  Result Value Ref Range   WBC 8.5 4.0 - 10.5 K/uL   RBC 2.71 (L) 4.22 - 5.81 MIL/uL   Hemoglobin 8.4 (L) 13.0 - 17.0 g/dL   HCT 23.8 (L) 39.0 - 52.0 %   MCV 87.8 78.0 - 100.0 fL   MCH 31.0 26.0 - 34.0 pg   MCHC  35.3 30.0 - 36.0 g/dL   RDW 12.9 11.5 - 15.5 %   Platelets 132 (L) 150 - 400 K/uL   No results found.  Review of Systems  Constitutional: Positive for diaphoresis.  HENT: Negative.   Eyes: Negative.   Respiratory: Negative.   Cardiovascular: Negative.   Gastrointestinal: Negative.   Genitourinary: Negative.   Musculoskeletal: Negative.   Skin: Negative.   Neurological: Positive for weakness.  Endo/Heme/Allergies: Negative.   Psychiatric/Behavioral: Negative.     There were no vitals taken for this visit. Physical Exam   Assessment/Plan SP severe panniculectomy on 8 22 2016  Admitted for post op bleeding and seroma and care on 8 24 2016  Lian Pounds L 01/19/2015, 11:27 AM

## 2015-01-19 NOTE — Progress Notes (Addendum)
Patient is doing well  He is to be discharged today after ambulation  SP excision of severe panniculus on 8 22 2016  Had post op bleeding and seroma formation Has done well and is to be discharged on today to be seen by me on Monday.Instructions given to family.Condition at discharge  improvrd

## 2015-01-25 ENCOUNTER — Emergency Department (HOSPITAL_COMMUNITY)
Admission: EM | Admit: 2015-01-25 | Discharge: 2015-01-26 | Disposition: A | Discharge status: Home / Self Care | Payer: Medicaid Other | Attending: Emergency Medicine | Admitting: Emergency Medicine

## 2015-01-25 ENCOUNTER — Encounter (HOSPITAL_COMMUNITY): Payer: Self-pay | Admitting: *Deleted

## 2015-01-25 DIAGNOSIS — Z79899 Other long term (current) drug therapy: Secondary | ICD-10-CM | POA: Diagnosis not present

## 2015-01-25 DIAGNOSIS — E119 Type 2 diabetes mellitus without complications: Secondary | ICD-10-CM | POA: Diagnosis not present

## 2015-01-25 DIAGNOSIS — R531 Weakness: Secondary | ICD-10-CM | POA: Diagnosis present

## 2015-01-25 DIAGNOSIS — Z86010 Personal history of colonic polyps: Secondary | ICD-10-CM | POA: Diagnosis not present

## 2015-01-25 DIAGNOSIS — M1990 Unspecified osteoarthritis, unspecified site: Secondary | ICD-10-CM | POA: Insufficient documentation

## 2015-01-25 DIAGNOSIS — G8929 Other chronic pain: Secondary | ICD-10-CM | POA: Insufficient documentation

## 2015-01-25 DIAGNOSIS — E860 Dehydration: Secondary | ICD-10-CM | POA: Insufficient documentation

## 2015-01-25 DIAGNOSIS — Z8673 Personal history of transient ischemic attack (TIA), and cerebral infarction without residual deficits: Secondary | ICD-10-CM | POA: Insufficient documentation

## 2015-01-25 DIAGNOSIS — F329 Major depressive disorder, single episode, unspecified: Secondary | ICD-10-CM | POA: Insufficient documentation

## 2015-01-25 DIAGNOSIS — K219 Gastro-esophageal reflux disease without esophagitis: Secondary | ICD-10-CM | POA: Diagnosis not present

## 2015-01-25 LAB — COMPREHENSIVE METABOLIC PANEL
ALT: 32 U/L (ref 17–63)
AST: 23 U/L (ref 15–41)
Albumin: 1.8 g/dL — ABNORMAL LOW (ref 3.5–5.0)
Alkaline Phosphatase: 157 U/L — ABNORMAL HIGH (ref 38–126)
Anion gap: 7 (ref 5–15)
BUN: 45 mg/dL — AB (ref 6–20)
CHLORIDE: 101 mmol/L (ref 101–111)
CO2: 25 mmol/L (ref 22–32)
CREATININE: 2.25 mg/dL — AB (ref 0.61–1.24)
Calcium: 8.1 mg/dL — ABNORMAL LOW (ref 8.9–10.3)
GFR calc non Af Amer: 33 mL/min — ABNORMAL LOW (ref >60)
GFR, EST AFRICAN AMERICAN: 39 mL/min — AB (ref >60)
Glucose, Bld: 268 mg/dL — ABNORMAL HIGH (ref 65–99)
Potassium: 4.6 mmol/L (ref 3.5–5.1)
SODIUM: 133 mmol/L — AB (ref 135–145)
Total Bilirubin: 0.7 mg/dL (ref 0.3–1.2)
Total Protein: 5.7 g/dL — ABNORMAL LOW (ref 6.5–8.1)

## 2015-01-25 LAB — CBC
HCT: 25.4 % — ABNORMAL LOW (ref 39.0–52.0)
Hemoglobin: 8.5 g/dL — ABNORMAL LOW (ref 13.0–17.0)
MCH: 30.6 pg (ref 26.0–34.0)
MCHC: 33.5 g/dL (ref 30.0–36.0)
MCV: 91.4 fL (ref 78.0–100.0)
PLATELETS: 280 10*3/uL (ref 150–400)
RBC: 2.78 MIL/uL — AB (ref 4.22–5.81)
RDW: 13.3 % (ref 11.5–15.5)
WBC: 6 10*3/uL (ref 4.0–10.5)

## 2015-01-25 LAB — CBG MONITORING, ED: Glucose-Capillary: 166 mg/dL — ABNORMAL HIGH (ref 65–99)

## 2015-01-25 LAB — I-STAT CG4 LACTIC ACID, ED
LACTIC ACID, VENOUS: 2.12 mmol/L — AB (ref 0.5–2.0)
Lactic Acid, Venous: 1.52 mmol/L (ref 0.5–2.0)

## 2015-01-25 MED ORDER — SODIUM CHLORIDE 0.9 % IV BOLUS (SEPSIS)
1000.0000 mL | Freq: Once | INTRAVENOUS | Status: AC
  Administered 2015-01-25: 1000 mL via INTRAVENOUS

## 2015-01-25 NOTE — Discharge Instructions (Signed)
Drink plenty of fluids.  Follow up with your md next week as planned

## 2015-01-25 NOTE — ED Notes (Signed)
Pt reports having abd surgery two weeks ago, has drains in place. Reports last week having blood transfusion due to blood loss from drains. Pt still had problems with low bp according to home health. bp is 120/46 at triage, pt does appear pale at triage. Denies any recent fever.

## 2015-01-25 NOTE — ED Notes (Signed)
Pt CBG rersult was 166. Informed Tori - RN.

## 2015-01-26 NOTE — ED Provider Notes (Signed)
CSN: [id number]     Arrival date & time 01/25/15  1709 History   First MD Initiated Contact with Patient 01/25/15 1834     Chief Complaint  Patient presents with  . Post-op Problem     (Consider location/radiation/quality/duration/timing/severity/associated sxs/prior Treatment) Patient is a 44 y.o. male presenting with weakness. The history is provided by the patient (the pt had abd surgery 2 weeks ago,  recently had blood transfusion.  he is here for weakness and low bp).  Weakness This is a recurrent problem. The current episode started 12 to 24 hours ago. The problem occurs constantly. The problem has not changed since onset.Pertinent negatives include no chest pain, no abdominal pain and no headaches. Nothing aggravates the symptoms. Nothing relieves the symptoms.    Past Medical History  Diagnosis Date  . GERD (gastroesophageal reflux disease)   . Colon polyps   . PONV (postoperative nausea and vomiting)   . Dysrhythmia     s/p surgery bariatric- cardioversion  . Sleep apnea     no longer have to use cpap since wt loss  . Depression     denies  . Diabetes mellitus     diet controlled- no meds since wt loss  . Stroke 2006    denies residual on 01/15/2015  . Arthritis     "hips" (01/15/2015)  . History of gout   . Chronic lower back pain    Past Surgical History  Procedure Laterality Date  . Bariatric surgery  2010    in [place]  . Colonoscopy N/A 08/02/2014    Procedure: COLONOSCOPY;  Surgeon: Rogene Houston, MD;  [facility];  Service: Endoscopy;  Laterality: N/A;  210 - moved to 3/10 @ 11:55 - Ann notified pt  . Cardioversion    . Esophagogastroduodenoscopy    . Tonsillectomy    . Panniculectomy  01/14/2015  . Ventral hernia repair  01/14/2015  . Hernia repair    . Panniculectomy N/A 01/14/2015    Procedure:  TOTAL PANNICULECTOMY WTH LIPOSUCTION OF SIDES;  Surgeon: Cristine Polio, MD;  Location: [facility];  Service: Plastics;  Laterality: N/A;  . Ventral  hernia repair N/A 01/14/2015    Procedure: HERNIA REPAIR VENTRAL ADULT AND MUSCLE REPAIR;  Surgeon: Cristine Polio, MD;  Location: [facility];  Service: Plastics;  Laterality: N/A;  . Liposuction     History reviewed. No pertinent family history. Social History  Substance Use Topics  . Smoking status: Never Smoker   . Smokeless tobacco: Never Used  . Alcohol Use: No    Review of Systems  Constitutional: Negative for appetite change and fatigue.  HENT: Negative for congestion, ear discharge and sinus pressure.   Eyes: Negative for discharge.  Respiratory: Negative for cough.   Cardiovascular: Negative for chest pain.  Gastrointestinal: Negative for abdominal pain and diarrhea.  Genitourinary: Negative for frequency and hematuria.  Musculoskeletal: Negative for back pain.  Skin: Negative for rash.  Neurological: Positive for weakness. Negative for seizures and headaches.  Psychiatric/Behavioral: Negative for hallucinations.      Allergies  Review of patient's allergies indicates no known allergies.  Home Medications   Prior to Admission medications   Medication Sig Start Date End Date Taking? Authorizing Provider  acetaminophen (TYLENOL) 500 MG tablet Take 1,000 mg by mouth every 6 (six) hours as needed for moderate pain.   Yes Historical Provider, MD  ALPRAZolam [name] Moron) 1 MG tablet Take 1 mg by mouth at bedtime as needed for anxiety.  Yes Historical Provider, MD  CALCIUM-VITAMIN D PO Take 1 tablet by mouth daily.   Yes Historical Provider, MD  glucosamine-chondroitin 500-400 MG tablet Take 1 tablet by mouth daily.   Yes Historical Provider, MD  latanoprost (XALATAN) 0.005 % ophthalmic solution Place 1 drop into both eyes at bedtime.   Yes Historical Provider, MD  Multiple Vitamins-Minerals (MULTIVITAMIN WITH MINERALS) tablet Take 1 tablet by mouth daily.   Yes Historical Provider, MD  Omega-3 Fatty Acids (OMEGA 3 PO) Take 1 capsule by mouth daily.   Yes Historical Provider, MD   omeprazole (PRILOSEC) 20 MG capsule Take 20 mg by mouth 2 (two) times daily before a meal.   Yes Historical Provider, MD  oxyCODONE-acetaminophen (PERCOCET) 10-325 MG per tablet Take 1 tablet by mouth every 4 (four) hours as needed for pain.   Yes Historical Provider, MD  timolol (BETIMOL) 0.5 % ophthalmic solution Apply 1 drop to eye daily.   Yes Historical Provider, MD  tiZANidine (ZANAFLEX) 4 MG tablet Take 4 mg by mouth every 8 (eight) hours as needed for muscle spasms.   Yes Historical Provider, MD   BP 120/88 mmHg  Pulse 91  Resp 22  SpO2 100% Physical Exam  Constitutional: He is oriented to person, place, and time. He appears well-developed.  HENT:  Head: Normocephalic.  Eyes: Conjunctivae and EOM are normal. No scleral icterus.  Neck: Neck supple. No thyromegaly present.  Cardiovascular: Normal rate and regular rhythm.  Exam reveals no gallop and no friction rub.   No murmur heard. Pulmonary/Chest: No stridor. He has no wheezes. He has no rales. He exhibits no tenderness.  Abdominal: He exhibits no distension. There is no tenderness. There is no rebound.  Healing large abd incision.  Left side of incision with a couple staples out.  Musculoskeletal: Normal range of motion. He exhibits no edema.  Lymphadenopathy:    He has no cervical adenopathy.  Neurological: He is oriented to person, place, and time. He exhibits normal muscle tone. Coordination normal.  Skin: No rash noted. No erythema.  Psychiatric: He has a normal mood and affect. His behavior is normal.    ED Course  Procedures (including critical care time) Labs Review Labs Reviewed  COMPREHENSIVE METABOLIC PANEL - Abnormal; Notable for the following:    Sodium 133 (*)    Glucose, Bld 268 (*)    BUN 45 (*)    Creatinine, Ser 2.25 (*)    Calcium 8.1 (*)    Total Protein 5.7 (*)    Albumin 1.8 (*)    Alkaline Phosphatase 157 (*)    GFR calc non Af Amer 33 (*)    GFR calc Af Amer 39 (*)    All other components  within normal limits  CBC - Abnormal; Notable for the following:    RBC 2.78 (*)    Hemoglobin 8.5 (*)    HCT 25.4 (*)    All other components within normal limits  I-STAT CG4 LACTIC ACID, ED - Abnormal; Notable for the following:    Lactic Acid, Venous 2.12 (*)    All other components within normal limits  CBG MONITORING, ED - Abnormal; Notable for the following:    Glucose-Capillary 166 (*)    All other components within normal limits  I-STAT CG4 LACTIC ACID, ED  TYPE AND SCREEN    Imaging Review No results found. I have personally reviewed and evaluated these images and lab results as part of my medical decision-making.   EKG Interpretation  Date/Time:  Friday January 25 2015 19:39:42 EDT Ventricular Rate:  83 PR Interval:  166 QRS Duration: 154 QT Interval:  389 QTC Calculation: 457 R Axis:   -20 Text Interpretation:  Age not entered, assumed to be  44 years old for  purpose of ECG interpretation Sinus rhythm Right bundle branch block  Confirmed by Saturnino Liew  MD, Amberleigh Gerken [id number]) on 01/25/2015 7:45:34 PM      MDM   Final diagnoses:  Dehydration      Labs reviewed,  Anemia stable,  Elevated bun and creatinine c/w dehydration.  Pt given 2 liters of ns and will follow up with surgeon this week  Milton Ferguson, MD 01/26/15 1534

## 2015-01-26 NOTE — ED Notes (Signed)
abd pads replaced around the front half of pt's body and secured with hyperfix, continuing the bandaging he has received at home.

## 2015-01-27 LAB — TYPE AND SCREEN
ABO/RH(D): O POS
Antibody Screen: NEGATIVE

## 2015-02-25 NOTE — Discharge Summary (Signed)
[name], [name] NO.:  [id number]  MEDICAL RECORD NO.:  [id number]  LOCATION:  C24C                         FACILITY:  [facility]  PHYSICIAN:  Berneta Sages L. Towanda Malkin, M.D.DATE OF BIRTH:  21-Nov-1970  DATE OF ADMISSION:  01/25/2015 DATE OF DISCHARGE:  01/26/2015                              DISCHARGE SUMMARY   HOSPITAL COURSE:  [name] who was admitted several days ago for excision of severe panniculectomy of his belly.  Did well when he was discharged and over the last 24 hours, had increased swelling and fainting episodes at home, was seen in the emergency room today on January 16, 2015, and admitted for overnight stay.  The patient was admitted for IV fluid resuscitation.  He has done very well.  Bowel sounds are stable.  He is up and about, much better.  Drains are working fine and no fever.  He was admitted from January 16, 2015, and discharged on January 19, 2015.  Instructions given to the family in entirety.  They are to call me if any medical problems.  We will arrange for home health nurses to probably see the patient with another day or so.  DISCHARGE DIAGNOSIS:  Syncopal episodes at home status post severe panniculectomy of the abdomen.  CONDITION AT DISCHARGE:  Improved.  COMPLICATIONS:  None.  MEDICATIONS:  Continue antibiotics as before, Keflex 500 mg q.12 hours, hydrocodone 325/10 mg 1 tab by mouth every 3 to 4 hours as needed.  The patient is to call me for any medical problems.     Odella Aquas. Towanda Malkin, M.D.     Elie Confer  D:  02/25/2015  T:  02/25/2015  Job:  [id number]

## 2015-03-29 ENCOUNTER — Emergency Department (HOSPITAL_COMMUNITY)
Admission: EM | Admit: 2015-03-29 | Discharge: 2015-03-29 | Disposition: A | Discharge status: Home / Self Care | Payer: No Typology Code available for payment source | Attending: Emergency Medicine | Admitting: Emergency Medicine

## 2015-03-29 ENCOUNTER — Emergency Department (HOSPITAL_COMMUNITY): Payer: No Typology Code available for payment source

## 2015-03-29 ENCOUNTER — Encounter (HOSPITAL_COMMUNITY): Payer: Self-pay | Admitting: Emergency Medicine

## 2015-03-29 DIAGNOSIS — Y9389 Activity, other specified: Secondary | ICD-10-CM | POA: Diagnosis not present

## 2015-03-29 DIAGNOSIS — Y92410 Unspecified street and highway as the place of occurrence of the external cause: Secondary | ICD-10-CM | POA: Insufficient documentation

## 2015-03-29 DIAGNOSIS — Z79899 Other long term (current) drug therapy: Secondary | ICD-10-CM | POA: Diagnosis not present

## 2015-03-29 DIAGNOSIS — S3992XA Unspecified injury of lower back, initial encounter: Secondary | ICD-10-CM | POA: Insufficient documentation

## 2015-03-29 DIAGNOSIS — Z8673 Personal history of transient ischemic attack (TIA), and cerebral infarction without residual deficits: Secondary | ICD-10-CM | POA: Insufficient documentation

## 2015-03-29 DIAGNOSIS — F329 Major depressive disorder, single episode, unspecified: Secondary | ICD-10-CM | POA: Diagnosis not present

## 2015-03-29 DIAGNOSIS — M1990 Unspecified osteoarthritis, unspecified site: Secondary | ICD-10-CM | POA: Insufficient documentation

## 2015-03-29 DIAGNOSIS — E119 Type 2 diabetes mellitus without complications: Secondary | ICD-10-CM | POA: Diagnosis not present

## 2015-03-29 DIAGNOSIS — Z86010 Personal history of colonic polyps: Secondary | ICD-10-CM | POA: Diagnosis not present

## 2015-03-29 DIAGNOSIS — Z8679 Personal history of other diseases of the circulatory system: Secondary | ICD-10-CM | POA: Diagnosis not present

## 2015-03-29 DIAGNOSIS — K219 Gastro-esophageal reflux disease without esophagitis: Secondary | ICD-10-CM | POA: Diagnosis not present

## 2015-03-29 DIAGNOSIS — G8929 Other chronic pain: Secondary | ICD-10-CM | POA: Diagnosis not present

## 2015-03-29 DIAGNOSIS — Y998 Other external cause status: Secondary | ICD-10-CM | POA: Diagnosis not present

## 2015-03-29 MED ORDER — ACETAMINOPHEN 325 MG PO TABS
650.0000 mg | ORAL_TABLET | Freq: Once | ORAL | Status: AC
  Administered 2015-03-29: 650 mg via ORAL
  Filled 2015-03-29: qty 2

## 2015-03-29 NOTE — Discharge Instructions (Signed)
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
## 2015-03-29 NOTE — ED Notes (Signed)
AVS explained in detail. Knows to follow up with PCP regarding CT scan results. Abdominal dressings re-dressed with wet to dry dressing. No other c/c. Wounds are clean pink and intact.
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
## 2015-09-24 ENCOUNTER — Other Ambulatory Visit: Payer: Self-pay

## 2016-02-20 ENCOUNTER — Encounter (HOSPITAL_COMMUNITY): Payer: Self-pay

## 2016-02-20 ENCOUNTER — Emergency Department (HOSPITAL_COMMUNITY)
Admission: EM | Admit: 2016-02-20 | Discharge: 2016-02-20 | Disposition: A | Discharge status: Home / Self Care | Payer: Medicaid Other | Attending: Emergency Medicine | Admitting: Emergency Medicine

## 2016-02-20 ENCOUNTER — Emergency Department (HOSPITAL_COMMUNITY): Payer: Medicaid Other

## 2016-02-20 DIAGNOSIS — N289 Disorder of kidney and ureter, unspecified: Secondary | ICD-10-CM | POA: Diagnosis not present

## 2016-02-20 DIAGNOSIS — T23111A Burn of first degree of right thumb (nail), initial encounter: Secondary | ICD-10-CM | POA: Insufficient documentation

## 2016-02-20 DIAGNOSIS — R0789 Other chest pain: Secondary | ICD-10-CM | POA: Insufficient documentation

## 2016-02-20 DIAGNOSIS — S301XXA Contusion of abdominal wall, initial encounter: Secondary | ICD-10-CM | POA: Diagnosis not present

## 2016-02-20 DIAGNOSIS — S20219A Contusion of unspecified front wall of thorax, initial encounter: Secondary | ICD-10-CM

## 2016-02-20 DIAGNOSIS — E119 Type 2 diabetes mellitus without complications: Secondary | ICD-10-CM | POA: Insufficient documentation

## 2016-02-20 DIAGNOSIS — Y9389 Activity, other specified: Secondary | ICD-10-CM | POA: Insufficient documentation

## 2016-02-20 DIAGNOSIS — Z79899 Other long term (current) drug therapy: Secondary | ICD-10-CM | POA: Insufficient documentation

## 2016-02-20 DIAGNOSIS — Y999 Unspecified external cause status: Secondary | ICD-10-CM | POA: Diagnosis not present

## 2016-02-20 DIAGNOSIS — Y92410 Unspecified street and highway as the place of occurrence of the external cause: Secondary | ICD-10-CM | POA: Diagnosis not present

## 2016-02-20 DIAGNOSIS — S61218A Laceration without foreign body of other finger without damage to nail, initial encounter: Secondary | ICD-10-CM | POA: Diagnosis not present

## 2016-02-20 DIAGNOSIS — S6991XA Unspecified injury of right wrist, hand and finger(s), initial encounter: Secondary | ICD-10-CM | POA: Diagnosis present

## 2016-02-20 LAB — CBC WITH DIFFERENTIAL/PLATELET
BASOS ABS: 0 10*3/uL (ref 0.0–0.1)
Basophils Relative: 0 %
EOS PCT: 1 %
Eosinophils Absolute: 0.2 10*3/uL (ref 0.0–0.7)
HEMATOCRIT: 39.8 % (ref 39.0–52.0)
Hemoglobin: 13.4 g/dL (ref 13.0–17.0)
LYMPHS ABS: 1.8 10*3/uL (ref 0.7–4.0)
LYMPHS PCT: 15 %
MCH: 27.9 pg (ref 26.0–34.0)
MCHC: 33.7 g/dL (ref 30.0–36.0)
MCV: 82.9 fL (ref 78.0–100.0)
MONO ABS: 0.7 10*3/uL (ref 0.1–1.0)
Monocytes Relative: 6 %
NEUTROS ABS: 9.5 10*3/uL — AB (ref 1.7–7.7)
Neutrophils Relative %: 78 %
PLATELETS: 198 10*3/uL (ref 150–400)
RBC: 4.8 MIL/uL (ref 4.22–5.81)
RDW: 15.6 % — ABNORMAL HIGH (ref 11.5–15.5)
WBC: 12.2 10*3/uL — AB (ref 4.0–10.5)

## 2016-02-20 LAB — COMPREHENSIVE METABOLIC PANEL
ALK PHOS: 55 U/L (ref 38–126)
ALT: 19 U/L (ref 17–63)
ANION GAP: 7 (ref 5–15)
AST: 25 U/L (ref 15–41)
Albumin: 3.6 g/dL (ref 3.5–5.0)
BUN: 26 mg/dL — AB (ref 6–20)
CHLORIDE: 104 mmol/L (ref 101–111)
CO2: 26 mmol/L (ref 22–32)
Calcium: 9.1 mg/dL (ref 8.9–10.3)
Creatinine, Ser: 1.67 mg/dL — ABNORMAL HIGH (ref 0.61–1.24)
GFR calc Af Amer: 55 mL/min — ABNORMAL LOW (ref >60)
GFR calc non Af Amer: 48 mL/min — ABNORMAL LOW (ref >60)
GLUCOSE: 153 mg/dL — AB (ref 65–99)
POTASSIUM: 3.7 mmol/L (ref 3.5–5.1)
Sodium: 137 mmol/L (ref 135–145)
Total Bilirubin: 1.3 mg/dL — ABNORMAL HIGH (ref 0.3–1.2)
Total Protein: 7.1 g/dL (ref 6.5–8.1)

## 2016-02-20 LAB — TROPONIN I: Troponin I: <0.03 ng/mL (ref <0.03)

## 2016-02-20 MED ORDER — CYCLOBENZAPRINE HCL 5 MG PO TABS: 5.0000 mg | ORAL_TABLET | Freq: Three times a day (TID) | ORAL | 0 refills | Status: DC | PRN

## 2016-02-20 MED ORDER — ACETAMINOPHEN 325 MG PO TABS
650.0000 mg | ORAL_TABLET | Freq: Once | ORAL | Status: AC
  Administered 2016-02-20: 650 mg via ORAL
  Filled 2016-02-20: qty 2

## 2016-02-20 MED ORDER — SODIUM CHLORIDE 0.9 % IV BOLUS (SEPSIS)
1000.0000 mL | Freq: Once | INTRAVENOUS | Status: AC
  Administered 2016-02-20: 1000 mL via INTRAVENOUS

## 2016-02-20 MED ORDER — IOPAMIDOL (ISOVUE-300) INJECTION 61%
100.0000 mL | Freq: Once | INTRAVENOUS | Status: AC | PRN
Start: 2016-02-20 — End: 2016-02-20
  Administered 2016-02-20: 100 mL via INTRAVENOUS

## 2016-02-20 MED ORDER — SODIUM CHLORIDE 0.9 % IV BOLUS (SEPSIS)
500.0000 mL | Freq: Once | INTRAVENOUS | Status: AC
  Administered 2016-02-20: 500 mL via INTRAVENOUS
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
## 2016-02-20 NOTE — Discharge Instructions (Signed)
Ice packs to the injured or sore muscles for the next several days then start using heat. Take the medications for muscle spasms. You may be more stiff and sore tomorrow than you are today. You can take acetaminophen 650 mg 4 times a day for pain. You should avoid ibuprofen, Motrin, Aleve, naproxen, or aspirin due to your underlying kidney disease. Return to the ED for any problems listed on the head injury sheet. Recheck if you aren't improving in the next week  Or if you get worse such as you are struggling to breathe, you have uncontrolled vomiting, or worsening symptoms. You should have your doctor recheck your kidney function in about a week.
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
## 2016-02-20 NOTE — ED Notes (Signed)
Patient was given orange juice and graham crackers for consumption.
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
## 2016-10-12 DIAGNOSIS — G894 Chronic pain syndrome: Secondary | ICD-10-CM | POA: Diagnosis not present

## 2016-10-12 DIAGNOSIS — M545 Low back pain: Secondary | ICD-10-CM | POA: Diagnosis not present

## 2017-02-11 ENCOUNTER — Encounter: Payer: Self-pay | Admitting: Family Medicine

## 2017-02-11 ENCOUNTER — Other Ambulatory Visit (HOSPITAL_COMMUNITY): Payer: Self-pay | Admitting: Nephrology

## 2017-02-11 ENCOUNTER — Ambulatory Visit (INDEPENDENT_AMBULATORY_CARE_PROVIDER_SITE_OTHER): Payer: Medicaid Other | Admitting: Family Medicine

## 2017-02-11 VITALS — BP 138/84 | HR 60 | Temp 98.3°F | Resp 20 | Ht 68.0 in | Wt 276.0 lb

## 2017-02-11 DIAGNOSIS — G8929 Other chronic pain: Secondary | ICD-10-CM

## 2017-02-11 DIAGNOSIS — F1320 Sedative, hypnotic or anxiolytic dependence, uncomplicated: Secondary | ICD-10-CM | POA: Diagnosis not present

## 2017-02-11 DIAGNOSIS — M549 Dorsalgia, unspecified: Secondary | ICD-10-CM

## 2017-02-11 DIAGNOSIS — H402230 Chronic angle-closure glaucoma, bilateral, stage unspecified: Secondary | ICD-10-CM

## 2017-02-11 DIAGNOSIS — Z9884 Bariatric surgery status: Secondary | ICD-10-CM

## 2017-02-11 DIAGNOSIS — F1120 Opioid dependence, uncomplicated: Secondary | ICD-10-CM | POA: Diagnosis not present

## 2017-02-11 DIAGNOSIS — N183 Chronic kidney disease, stage 3 unspecified: Secondary | ICD-10-CM

## 2017-02-11 DIAGNOSIS — Z23 Encounter for immunization: Secondary | ICD-10-CM

## 2017-02-11 HISTORY — DX: Opioid dependence, uncomplicated: F11.20

## 2017-02-11 HISTORY — DX: Other chronic pain: G89.29

## 2017-02-11 HISTORY — DX: Dorsalgia, unspecified: M54.9

## 2017-02-11 MED ORDER — HYDROXYZINE HCL 25 MG PO TABS: ORAL_TABLET | ORAL | 1 refills | Status: DC

## 2017-02-11 MED ORDER — ALPRAZOLAM 0.5 MG PO TABS: 0.5000 mg | ORAL_TABLET | Freq: Every evening | ORAL | 0 refills | Status: DC | PRN

## 2017-02-11 NOTE — Progress Notes (Signed)
Chief Complaint  Patient presents with  . Hypertension   New patient Previous was super obese with weight over 500 lbs Had gastric bypass surgery and lost down to 275 PREVIOUSLY, when obese, had diabetes, hypertension, hyperlipidemia and a TIA. Now he is on vitamins, eyedrops for glaucoma and pain medicine for chronic back condition and flexeril Is on xanax 1 mg at night for sleep for over 10 years.  We discussed that this is not safe or indicated.  He also takes regular oxycodone. This is not a good combination.  He says he cannot sleep without.  He knows I will not keep him on.  I will help him taper slowly.  He is offered 0.5 mg a night for one month, then 0.25 for a month, then d/c. He can take in addition hydroxyzine to help relax.  He refuses offer of elavil or remeron or trazodone due to the fact he does not want anti depressants. He is still Obese with a BMI of 41.  Not interested in additional weight loss, as his conditions are controlled. He works part time at [employer].  Lives with aged grandmother.  Patient Active Problem List   Diagnosis Date Noted  . Chronic back pain greater than 3 months duration 02/11/2017  . Narcotic dependence ([place]) 02/11/2017  . Benzodiazepine dependence ([place]) 02/11/2017  . Gastric bypass status for obesity 02/11/2017  . COLONIC POLYPS, ADENOMATOUS 08/03/2008  . HYPERLIPIDEMIA 08/03/2008  . MORBID OBESITY 08/03/2008  . CVA 08/03/2008  . HIATAL HERNIA 08/03/2008  . SLEEP APNEA 08/03/2008  . ALLERGY 08/03/2008    Outpatient Encounter Prescriptions as of 02/11/2017  Medication Sig  . acetaminophen (TYLENOL) 500 MG tablet Take 1,000 mg by mouth every 6 (six) hours as needed for moderate pain.  Marland Kitchen CALCIUM-VITAMIN D PO Take 1 tablet by mouth daily.  . cyclobenzaprine (FLEXERIL) 5 MG tablet Take 1 tablet (5 mg total) by mouth 3 (three) times daily as needed.  . latanoprost (XALATAN) 0.005 % ophthalmic solution Place 1 drop into both eyes at bedtime.  .  Multiple Vitamins-Minerals (MULTIVITAMIN WITH MINERALS) tablet Take 1 tablet by mouth daily.  . Omega-3 Fatty Acids (OMEGA 3 PO) Take 1 capsule by mouth daily.  Marland Kitchen oxyCODONE-acetaminophen (PERCOCET) 10-325 MG per tablet Take 1 tablet by mouth every 4 (four) hours as needed for pain.  Marland Kitchen timolol (BETIMOL) 0.5 % ophthalmic solution Apply 1 drop to eye daily.  Marland Kitchen ALPRAZolam (XANAX) 0.5 MG tablet Take 1 tablet (0.5 mg total) by mouth at bedtime as needed for anxiety.  . hydrOXYzine (ATARAX/VISTARIL) 25 MG tablet Take one or two at bedtime to help with sleep   No facility-administered encounter medications on file as of 02/11/2017.     Past Medical History:  Diagnosis Date  . Anemia   . Arthritis    "hips" (01/15/2015)  . Blood transfusion without reported diagnosis   . Chronic lower back pain   . Colon polyps   . Depression    denies  . Diabetes mellitus    diet controlled- no meds since wt loss  . Dysrhythmia    s/p surgery bariatric- cardioversion  . GERD (gastroesophageal reflux disease)   . History of gout   . Hyperlipidemia   . Hypertension   . Neuromuscular disorder ([place])   . PONV (postoperative nausea and vomiting)   . Sleep apnea    no longer have to use cpap since wt loss  . Stroke [facility]) 2006   denies residual on 01/15/2015  Past Surgical History:  Procedure Laterality Date  . BARIATRIC SURGERY  2010   in [place]  . CARDIOVERSION    . COLONOSCOPY N/A 08/02/2014   Procedure: COLONOSCOPY;  Surgeon: Rogene Houston, MD;  [facility];  Service: Endoscopy;  Laterality: N/A;  210 - moved to 3/10 @ 11:55 - Ann notified pt  . ESOPHAGOGASTRODUODENOSCOPY    . HERNIA REPAIR    . LIPOSUCTION    . PANNICULECTOMY  01/14/2015  . PANNICULECTOMY N/A 01/14/2015   Procedure:  TOTAL PANNICULECTOMY WTH LIPOSUCTION OF SIDES;  Surgeon: Cristine Polio, MD;  Location: [place];  Service: Plastics;  Laterality: N/A;  . TONSILLECTOMY    . VENTRAL HERNIA REPAIR  01/14/2015  . VENTRAL  HERNIA REPAIR N/A 01/14/2015   Procedure: HERNIA REPAIR VENTRAL ADULT AND MUSCLE REPAIR;  Surgeon: Cristine Polio, MD;  Location: [place];  Service: Plastics;  Laterality: N/A;    Social History   Social History  . Marital status: Single    Spouse name: N/A  . Number of children: 0  . Years of education: 12   Occupational History  . part time     [employer]   Social History Main Topics  . Smoking status: Never Smoker  . Smokeless tobacco: Never Used  . Alcohol use No  . Drug use: No  . Sexual activity: Yes   Other Topics Concern  . Not on file   Social History Narrative   Lives with Catalina Lunger - age 1   Cares for her    Family History  Problem Relation Age of Onset  . COPD Mother   . Depression Mother   . Hyperlipidemia Mother   . Hypertension Mother   . Heart disease Mother   . Diabetes Father   . Pulmonary embolism Father 14       blood clot  . Hypertension Brother     Review of Systems  Constitutional: Negative for weight loss.  HENT: Negative for hearing loss and sinus pain.   Eyes: Negative for blurred vision and redness.  Respiratory: Negative for cough and shortness of breath.   Cardiovascular: Negative for chest pain, palpitations and leg swelling.  Gastrointestinal: Negative for abdominal pain and heartburn.  Genitourinary: Negative for dysuria and frequency.  Musculoskeletal: Positive for back pain and joint pain.  Neurological: Negative for dizziness, weakness and headaches.  Psychiatric/Behavioral: The patient has insomnia. The patient is not nervous/anxious.     BP 138/84 (BP Location: Right Arm, Patient Position: Sitting, Cuff Size: Large)   Pulse 60   Temp 98.3 F (36.8 C) (Temporal)   Resp 20   Ht 5\' 8"  (1.727 m)   Wt 276 lb (125.2 kg)   SpO2 97%   BMI 41.97 kg/m   Physical Exam  Constitutional: He is oriented to person, place, and time. He appears well-developed.  obese  HENT:  Head: Normocephalic and atraumatic.    Mouth/Throat: Oropharynx is clear and moist.  Eyes: Pupils are equal, round, and reactive to light. Conjunctivae are normal.  Neck: Normal range of motion. Neck supple.  Cardiovascular: Normal rate and regular rhythm.   Murmur heard. Pulmonary/Chest: Effort normal and breath sounds normal. He has no wheezes.  Musculoskeletal: He exhibits no edema.  Antalgic gait,  Valgus knees  Neurological: He is alert and oriented to person, place, and time.  Psychiatric: He has a normal mood and affect. His behavior is normal.  ASSESSMENT/PLAN:   1. Need for influenza vaccination - Flu Vaccine  QUAD 36+ mos IM  2. Chronic back pain greater than 3 months duration Sees pain med  3. Narcotic dependence ([facility]) Prescribed oxycodone  4. Benzodiazepine dependence ([facility]) Taper to discontinue.  Discussed reason for stopping, safety with opiates.  5. Gastric bypass status for obesity Successful 200 # weight loss - CBC - COMPLETE METABOLIC PANEL WITH GFR - Hemoglobin A1c - Urinalysis, Routine w reflex microscopic - Lipid panel - VITAMIN D 25 Hydroxy (Vit-D Deficiency, Fractures) - Vitamin B12  6. MORBID OBESITY  7. Glaucoma    Patient Instructions  Need to reduce the xanax to .5 mg a day Take the hydroxyzine at bedtime No other medicine changes Need blood work and follow up in one month  Need records Dr Wenda Overland   Raylene Everts, MD

## 2017-02-11 NOTE — Patient Instructions (Signed)
Need to reduce the xanax to .5 mg a day Take the hydroxyzine at bedtime No other medicine changes Need blood work and follow up in one month  Need records Dr Wenda Overland

## 2017-02-12 ENCOUNTER — Encounter: Payer: Self-pay | Admitting: Family Medicine

## 2017-02-12 LAB — HEMOGLOBIN A1C
EAG (MMOL/L): 7 (calc)
Hgb A1c MFr Bld: 6 % of total Hgb — ABNORMAL HIGH (ref <5.7)
Mean Plasma Glucose: 126 (calc)

## 2017-02-12 LAB — CBC
HCT: 38.2 % — ABNORMAL LOW (ref 38.5–50.0)
HEMOGLOBIN: 12.9 g/dL — AB (ref 13.2–17.1)
MCH: 29.9 pg (ref 27.0–33.0)
MCHC: 33.8 g/dL (ref 32.0–36.0)
MCV: 88.4 fL (ref 80.0–100.0)
MPV: 10.3 fL (ref 7.5–12.5)
Platelets: 191 10*3/uL (ref 140–400)
RBC: 4.32 10*6/uL (ref 4.20–5.80)
RDW: 13.6 % (ref 11.0–15.0)
WBC: 7.7 10*3/uL (ref 3.8–10.8)

## 2017-02-12 LAB — URINALYSIS, ROUTINE W REFLEX MICROSCOPIC
BACTERIA UA: NONE SEEN /HPF
BILIRUBIN URINE: NEGATIVE
Glucose, UA: NEGATIVE
HGB URINE DIPSTICK: NEGATIVE
Hyaline Cast: NONE SEEN /LPF
KETONES UR: NEGATIVE
LEUKOCYTES UA: NEGATIVE
Nitrite: NEGATIVE
PH: 5.5 (ref 5.0–8.0)
RBC / HPF: NONE SEEN /HPF (ref 0–2)
Specific Gravity, Urine: 1.022 (ref 1.001–1.03)
WBC UA: NONE SEEN /HPF (ref 0–5)

## 2017-02-12 LAB — COMPLETE METABOLIC PANEL WITH GFR
AG Ratio: 1.3 (calc) (ref 1.0–2.5)
ALBUMIN MSPROF: 3.9 g/dL (ref 3.6–5.1)
ALKALINE PHOSPHATASE (APISO): 61 U/L (ref 40–115)
ALT: 17 U/L (ref 9–46)
AST: 18 U/L (ref 10–40)
BUN / CREAT RATIO: 20 (calc) (ref 6–22)
BUN: 34 mg/dL — ABNORMAL HIGH (ref 7–25)
CO2: 27 mmol/L (ref 20–32)
Calcium: 9.8 mg/dL (ref 8.6–10.3)
Chloride: 107 mmol/L (ref 98–110)
Creat: 1.67 mg/dL — ABNORMAL HIGH (ref 0.60–1.35)
GFR, EST AFRICAN AMERICAN: 56 mL/min/{1.73_m2} — AB (ref >=60)
GFR, Est Non African American: 48 mL/min/{1.73_m2} — ABNORMAL LOW (ref >=60)
GLUCOSE: 118 mg/dL (ref 65–139)
Globulin: 3 g/dL (calc) (ref 1.9–3.7)
Potassium: 5.2 mmol/L (ref 3.5–5.3)
SODIUM: 139 mmol/L (ref 135–146)
Total Bilirubin: 0.5 mg/dL (ref 0.2–1.2)
Total Protein: 6.9 g/dL (ref 6.1–8.1)

## 2017-02-12 LAB — LIPID PANEL
CHOLESTEROL: 201 mg/dL — AB (ref <200)
HDL: 38 mg/dL — ABNORMAL LOW (ref >40)
LDL Cholesterol (Calc): 124 mg/dL (calc) — ABNORMAL HIGH
Non-HDL Cholesterol (Calc): 163 mg/dL (calc) — ABNORMAL HIGH (ref <130)
Total CHOL/HDL Ratio: 5.3 (calc) — ABNORMAL HIGH (ref <5.0)
Triglycerides: 239 mg/dL — ABNORMAL HIGH (ref <150)

## 2017-02-12 LAB — VITAMIN B12: VITAMIN B 12: 686 pg/mL (ref 200–1100)

## 2017-02-12 LAB — VITAMIN D 25 HYDROXY (VIT D DEFICIENCY, FRACTURES): Vit D, 25-Hydroxy: 33 ng/mL (ref 30–100)

## 2017-02-24 ENCOUNTER — Other Ambulatory Visit: Payer: Self-pay | Admitting: Family Medicine

## 2017-03-03 ENCOUNTER — Other Ambulatory Visit: Payer: Self-pay | Admitting: General Surgery

## 2017-03-03 ENCOUNTER — Ambulatory Visit (HOSPITAL_COMMUNITY)
Admission: RE | Admit: 2017-03-03 | Discharge: 2017-03-03 | Disposition: A | Discharge status: Home / Self Care | Payer: Medicaid Other | Source: Ambulatory Visit | Attending: Nephrology | Admitting: Nephrology

## 2017-03-03 DIAGNOSIS — N183 Chronic kidney disease, stage 3 unspecified: Secondary | ICD-10-CM

## 2017-03-15 ENCOUNTER — Ambulatory Visit: Payer: Medicaid Other | Admitting: Family Medicine

## 2017-03-29 ENCOUNTER — Ambulatory Visit: Payer: Medicaid Other | Admitting: Family Medicine

## 2017-03-29 ENCOUNTER — Encounter: Payer: Self-pay | Admitting: Family Medicine

## 2017-03-29 ENCOUNTER — Telehealth: Payer: Self-pay | Admitting: *Deleted

## 2017-03-29 ENCOUNTER — Other Ambulatory Visit (HOSPITAL_COMMUNITY)
Admission: RE | Admit: 2017-03-29 | Discharge: 2017-03-29 | Disposition: A | Discharge status: Home / Self Care | Payer: Medicaid Other | Source: Ambulatory Visit | Attending: Family Medicine | Admitting: Family Medicine

## 2017-03-29 VITALS — BP 138/88 | HR 72 | Temp 98.2°F | Resp 18 | Ht 68.0 in | Wt 278.1 lb

## 2017-03-29 DIAGNOSIS — G8929 Other chronic pain: Secondary | ICD-10-CM

## 2017-03-29 DIAGNOSIS — E785 Hyperlipidemia, unspecified: Secondary | ICD-10-CM | POA: Diagnosis not present

## 2017-03-29 DIAGNOSIS — Z113 Encounter for screening for infections with a predominantly sexual mode of transmission: Secondary | ICD-10-CM

## 2017-03-29 DIAGNOSIS — M549 Dorsalgia, unspecified: Secondary | ICD-10-CM | POA: Diagnosis not present

## 2017-03-29 DIAGNOSIS — R7303 Prediabetes: Secondary | ICD-10-CM

## 2017-03-29 HISTORY — DX: Prediabetes: R73.03

## 2017-03-29 MED ORDER — HYDROXYZINE HCL 25 MG PO TABS: ORAL_TABLET | ORAL | 1 refills | Status: DC

## 2017-03-29 NOTE — Patient Instructions (Addendum)
Need to reduce the carbohydrates and sweets in your diet Exercise every day that you are able Take the multivitamin twice a day  Need blood work and a follow up in 6 months I have placed referral to diabetes educator  Call sooner for problems

## 2017-03-29 NOTE — Telephone Encounter (Signed)
Patient came back in stating he forgot to tell Dr Meda Coffee he needs a prescription for diabetic shoes. Please advise

## 2017-03-29 NOTE — Progress Notes (Signed)
Chief Complaint  Patient presents with  . Follow-up    1 month   Patient is here for routine follow-up. He had lab work at his last visit.  We discussed his results today.  His hemoglobin A1c was 6.0.  He is prediabetic.  I discussed with him the importance of diet and exercise.  He needs to reduce the carbohydrates and sweets in his diet.  I am recommending a visit to a diabetes educator to help him. He has morbid obesity.  His BMI is 42.  He tells me that he used to weigh over 300 pounds, and he lost a lot of weight.  His weight is steadily been going up over the last 6 months to year, according to her chart review.  He weighed 240 pounds a year ago.  It is important that he stop this trend and not continue to gain all of his weight back. He has hyperlipidemia.  He states he went off of cholesterol medication when he lost weight.  This is something we will follow. He previously took Xanax to sleep.  He has tapered and discontinued this.  He now takes hydroxyzine as needed. He continues under the care of pain clinic and receives oxycodone for daily use.  He has chronic back pain with also arthritis in his knees and hips. He states that his girlfriend has requested that he have STD testing.  He tells me she suspects that he is feeling around.  He tells me that he is not, but would like to be tested to appease her.  He has no symptoms of urinary tract infection  Patient Active Problem List   Diagnosis Date Noted  . Prediabetes 03/29/2017  . Chronic back pain greater than 3 months duration 02/11/2017  . Narcotic dependence ([place]) 02/11/2017  . Benzodiazepine dependence ([place]) 02/11/2017  . Gastric bypass status for obesity 02/11/2017  . COLONIC POLYPS, ADENOMATOUS 08/03/2008  . HLD (hyperlipidemia) 08/03/2008  . MORBID OBESITY 08/03/2008  . CVA 08/03/2008  . HIATAL HERNIA 08/03/2008  . SLEEP APNEA 08/03/2008  . ALLERGY 08/03/2008    Outpatient Encounter Medications as of 03/29/2017    Medication Sig  . acetaminophen (TYLENOL) 500 MG tablet Take 1,000 mg by mouth every 6 (six) hours as needed for moderate pain.  Marland Kitchen CALCIUM-VITAMIN D PO Take 1 tablet by mouth daily.  . cyclobenzaprine (FLEXERIL) 5 MG tablet Take 1 tablet (5 mg total) by mouth 3 (three) times daily as needed.  . hydrOXYzine (ATARAX/VISTARIL) 25 MG tablet Take one or two at bedtime to help with sleep  . latanoprost (XALATAN) 0.005 % ophthalmic solution Place 1 drop into both eyes at bedtime.  . Multiple Vitamins-Minerals (MULTIVITAMIN WITH MINERALS) tablet Take 1 tablet by mouth daily.  . Omega-3 Fatty Acids (OMEGA 3 PO) Take 1 capsule by mouth daily.  Marland Kitchen oxyCODONE-acetaminophen (PERCOCET) 10-325 MG per tablet Take 1 tablet by mouth every 4 (four) hours as needed for pain.  Marland Kitchen timolol (BETIMOL) 0.5 % ophthalmic solution Apply 1 drop to eye daily.   No facility-administered encounter medications on file as of 03/29/2017.     No Known Allergies  Review of Systems  Constitutional: Positive for unexpected weight change. Negative for activity change and appetite change.  HENT: Negative for congestion, postnasal drip and rhinorrhea.   Eyes: Negative for photophobia and visual disturbance.  Respiratory: Negative for cough and shortness of breath.   Cardiovascular: Negative for chest pain, palpitations and leg swelling.  Gastrointestinal: Negative for constipation,  diarrhea and nausea.  Genitourinary: Negative for difficulty urinating and frequency.  Musculoskeletal: Positive for arthralgias, back pain, gait problem and joint swelling.  Neurological: Negative for dizziness and headaches.  Psychiatric/Behavioral: Negative for dysphoric mood. The patient is not nervous/anxious.     BP 138/88   Pulse 72   Temp 98.2 F (36.8 C) (Temporal)   Resp 18   Ht 5\' 8"  (1.727 m)   Wt 278 lb 1.9 oz (126.2 kg)   SpO2 99%   BMI 42.29 kg/m   Physical Exam  Constitutional: He is oriented to person, place, and time. He  appears well-developed.  obese  HENT:  Head: Normocephalic and atraumatic.  Mouth/Throat: Oropharynx is clear and moist.  Eyes: Conjunctivae are normal. Pupils are equal, round, and reactive to light.  Neck: Normal range of motion. Neck supple.  Cardiovascular: Normal rate and regular rhythm.  Murmur heard. Pulmonary/Chest: Effort normal and breath sounds normal. He has no wheezes.  Abdominal: Soft. Bowel sounds are normal.  Obese  Musculoskeletal: He exhibits no edema.  Antalgic gait,  Valgus knees  Neurological: He is alert and oriented to person, place, and time.  Psychiatric: He has a normal mood and affect. His behavior is normal.    ASSESSMENT/PLAN:  1. Prediabetes Discussed diet and exercise recommendations - Ambulatory referral to diabetic education  2. Hyperlipidemia, unspecified hyperlipidemia type Low-fat diet recommended.  Will recheck in 6 months  3. Chronic back pain greater than 3 months duration Under care of pain specialty  4. MORBID OBESITY Slow weight increase over the last year.  Discussed  5. Screening examination for STD (sexually transmitted disease) Patient request - Urine cytology ancillary only   Patient Instructions  Need to reduce the carbohydrates and sweets in your diet Exercise every day that you are able Take the multivitamin twice a day  Need blood work and a follow up in 6 months I have placed referral to diabetes educator  Call sooner for problems   Raylene Everts, MD

## 2017-03-30 MED ORDER — UNABLE TO FIND: 0 refills | Status: DC

## 2017-03-30 NOTE — Telephone Encounter (Signed)
Done

## 2017-03-30 NOTE — Telephone Encounter (Signed)
Pend for me

## 2017-03-31 LAB — URINE CYTOLOGY ANCILLARY ONLY
CHLAMYDIA, DNA PROBE: NEGATIVE
NEISSERIA GONORRHEA: NEGATIVE
Trichomonas: NEGATIVE

## 2017-04-01 ENCOUNTER — Encounter: Payer: Self-pay | Admitting: Family Medicine

## 2017-04-22 ENCOUNTER — Other Ambulatory Visit (HOSPITAL_COMMUNITY): Payer: Self-pay | Admitting: Nephrology

## 2017-04-22 ENCOUNTER — Ambulatory Visit (HOSPITAL_COMMUNITY)
Admission: RE | Admit: 2017-04-22 | Discharge: 2017-04-22 | Disposition: A | Discharge status: Home / Self Care | Payer: Medicaid Other | Source: Ambulatory Visit | Attending: Vascular Surgery | Admitting: Vascular Surgery

## 2017-04-22 DIAGNOSIS — N183 Chronic kidney disease, stage 3 unspecified: Secondary | ICD-10-CM

## 2017-05-07 ENCOUNTER — Other Ambulatory Visit: Payer: Self-pay | Admitting: Family Medicine

## 2017-05-07 NOTE — Telephone Encounter (Signed)
Seen 11 5 18 

## 2017-05-13 ENCOUNTER — Ambulatory Visit: Payer: Medicaid Other | Admitting: Nutrition

## 2017-05-17 ENCOUNTER — Other Ambulatory Visit: Payer: Self-pay

## 2017-05-17 ENCOUNTER — Ambulatory Visit (INDEPENDENT_AMBULATORY_CARE_PROVIDER_SITE_OTHER): Payer: Medicaid Other | Admitting: Family Medicine

## 2017-05-17 ENCOUNTER — Encounter: Payer: Self-pay | Admitting: Family Medicine

## 2017-05-17 VITALS — BP 136/78 | HR 68 | Temp 98.7°F | Resp 18 | Ht 68.0 in | Wt 284.1 lb

## 2017-05-17 DIAGNOSIS — E1122 Type 2 diabetes mellitus with diabetic chronic kidney disease: Secondary | ICD-10-CM

## 2017-05-17 DIAGNOSIS — F418 Other specified anxiety disorders: Secondary | ICD-10-CM

## 2017-05-17 DIAGNOSIS — N182 Chronic kidney disease, stage 2 (mild): Secondary | ICD-10-CM | POA: Diagnosis not present

## 2017-05-17 MED ORDER — DULOXETINE HCL 30 MG PO CPEP: 30.0000 mg | ORAL_CAPSULE | Freq: Every day | ORAL | 3 refills | Status: DC

## 2017-05-17 NOTE — Patient Instructions (Signed)
Reduce the carbohydrates in your diet Take the duloxetine once a day This is for pain and anxiety Call for problems or side effects  I have documented the foot exam  See me in 1-2 months

## 2017-05-17 NOTE — Progress Notes (Signed)
Chief Complaint  Patient presents with  . foot exam  . Anxiety   Patient is here for follow-up.  He previously was diagnosed with diabetes and took medication.  He had gastric bypass surgery and lost weight.  He currently is on no medication for diabetes.  His last hemoglobin A1c was 6.0.  He is a diet controlled diabetic versus prediabetic.  He is requesting a prescription for diabetic shoes.  He needs a diabetic foot exam.  Since he is not on medication, and has a normal foot exam, and no history of skin breakdown or ulcerations, I am not sure why he needs diabetic foot wear.  He states he needs them because his old doctor prescribed them for him, and they are "good shoes". He is also morbidly obese.  He had a gastric bypass procedure.  He dropped down at 1 point to 240 pounds.  Today he is back up to 284.  He has consistently been gaining weight over the last 2 years.  We discussed that he needs to go back to a nutritionist.  I recommend that he pay very close attention to his carbohydrate intake and stop eating breads and pastas.  He also needs increased activity although he has orthopedic problems that limit his mobility. He was taking Xanax for anxiety.  I told him that this was not recommended on a long-term basis.  Is also not recommended with his chronic pain and opiate use.  I taken him off of the Xanax and offered him hydroxyzine at night.  He states this makes him "too drowsy".  I told him to take 1/2 pill a day.  He states his anxiety is getting worse and he would like medication for that.  We discussed the variety of medicines used for anxiety and I recommend an SSRI.  With his chronic pain I think duloxetine is a good choice.  He is willing to try this.  We discussed the side effects, proper use of medication, and delayed effectiveness of this class of drug.  Patient Active Problem List   Diagnosis Date Noted  . Prediabetes 03/29/2017  . Chronic back pain greater than 3 months  duration 02/11/2017  . Narcotic dependence ([place]) 02/11/2017  . Benzodiazepine dependence ([place]) 02/11/2017  . Gastric bypass status for obesity 02/11/2017  . COLONIC POLYPS, ADENOMATOUS 08/03/2008  . HLD (hyperlipidemia) 08/03/2008  . MORBID OBESITY 08/03/2008  . CVA 08/03/2008  . HIATAL HERNIA 08/03/2008  . SLEEP APNEA 08/03/2008  . ALLERGY 08/03/2008    Outpatient Encounter Medications as of 05/17/2017  Medication Sig  . acetaminophen (TYLENOL) 500 MG tablet Take 1,000 mg by mouth every 6 (six) hours as needed for moderate pain.  Marland Kitchen CALCIUM-VITAMIN D PO Take 1 tablet by mouth daily.  . cyclobenzaprine (FLEXERIL) 5 MG tablet Take 1 tablet (5 mg total) by mouth 3 (three) times daily as needed.  . hydrOXYzine (ATARAX/VISTARIL) 25 MG tablet Take one or two at bedtime to help with sleep  . hydrOXYzine (ATARAX/VISTARIL) 25 MG tablet TAKE 1 TO 2 TABLETS AT BEDTIME.  Marland Kitchen latanoprost (XALATAN) 0.005 % ophthalmic solution Place 1 drop into both eyes at bedtime.  . Multiple Vitamins-Minerals (MULTIVITAMIN WITH MINERALS) tablet Take 1 tablet by mouth daily.  . Omega-3 Fatty Acids (OMEGA 3 PO) Take 1 capsule by mouth daily.  Marland Kitchen oxyCODONE-acetaminophen (PERCOCET) 10-325 MG per tablet Take 1 tablet by mouth every 4 (four) hours as needed for pain.  Marland Kitchen timolol (BETIMOL) 0.5 % ophthalmic solution Apply  1 drop to eye daily.  Marland Kitchen UNABLE TO FIND 1 pair diabetic shoes 3 pairs of inserts Dx : r73.03  . DULoxetine (CYMBALTA) 30 MG capsule Take 1 capsule (30 mg total) by mouth daily.   No facility-administered encounter medications on file as of 05/17/2017.     No Known Allergies  Review of Systems  Constitutional: Positive for unexpected weight change. Negative for activity change and appetite change.       Continues to gain weight  HENT: Negative for congestion, postnasal drip and rhinorrhea.   Eyes: Negative for photophobia and visual disturbance.  Respiratory: Negative for cough and shortness of  breath.   Cardiovascular: Negative for chest pain, palpitations and leg swelling.  Gastrointestinal: Negative for constipation, diarrhea and nausea.  Genitourinary: Negative for difficulty urinating and frequency.  Musculoskeletal: Positive for arthralgias, back pain, gait problem and joint swelling.       Chronic pain, sees pain specialty  Neurological: Negative for dizziness and headaches.  Psychiatric/Behavioral: Negative for dysphoric mood. The patient is nervous/anxious.        States feels nervous and irritable daily    BP 136/78 (BP Location: Left Arm, Patient Position: Sitting, Cuff Size: Large)   Pulse 68   Temp 98.7 F (37.1 C) (Temporal)   Resp 18   Ht 5\' 8"  (1.727 m)   Wt 284 lb 1.3 oz (128.9 kg)   SpO2 99%   BMI 43.19 kg/m   Physical Exam  Constitutional: He is oriented to person, place, and time. He appears well-developed and well-nourished.  HENT:  Head: Normocephalic and atraumatic.  Mouth/Throat: Oropharynx is clear and moist.  Eyes: Conjunctivae are normal. Pupils are equal, round, and reactive to light.  Neck: Normal range of motion. Neck supple.  Cardiovascular: Normal rate and regular rhythm.  Murmur heard. Pulses:      Dorsalis pedis pulses are 1+ on the right side, and 1+ on the left side.       Posterior tibial pulses are 1+ on the right side, and 1+ on the left side.  Pulmonary/Chest: Effort normal and breath sounds normal. He has no wheezes.  Abdominal: Soft. Bowel sounds are normal.  Obese  Musculoskeletal: He exhibits no edema.       Right foot: There is normal range of motion and no deformity.       Left foot: There is normal range of motion and no deformity.  Antalgic gait,  Valgus knees  Feet:  Right Foot:  Protective Sensation: 4 sites tested. 4 sites sensed.  Skin Integrity: Positive for callus. Negative for ulcer or skin breakdown.  Left Foot:  Protective Sensation: 4 sites tested. 4 sites sensed.  Skin Integrity: Positive for callus.  Negative for ulcer or skin breakdown.  Neurological: He is alert and oriented to person, place, and time.  Psychiatric: He has a normal mood and affect. His behavior is normal.    ASSESSMENT/PLAN:  1. Type 2 diabetes mellitus with stage 2 chronic kidney disease, without long-term current use of insulin (HCC) Hemoglobin A1c is 6.0.  Discussed importance of diet and exercise  2. Other specified anxiety disorders Add duloxetine 30 mg a day.  3. MORBID OBESITY Back to nutrition for consultation.  Greater than 50% of this visit was spent in counseling and coordinating care.  Total face to face time:   25 minutes.  Most of this is review of lifestyle changes necessary to lose weight and control his diabetes.    Patient Instructions  Reduce the  carbohydrates in your diet Take the duloxetine once a day This is for pain and anxiety Call for problems or side effects  I have documented the foot exam  See me in 1-2 months   Raylene Everts, MD

## 2017-05-28 ENCOUNTER — Telehealth: Payer: Self-pay | Admitting: Family Medicine

## 2017-05-28 NOTE — Telephone Encounter (Signed)
Pt called and wants Sugar Meter and strip prescription..[phone]

## 2017-05-31 MED ORDER — BLOOD GLUCOSE MONITOR KIT: PACK | 0 refills | Status: DC

## 2017-05-31 NOTE — Telephone Encounter (Signed)
Done

## 2017-06-01 ENCOUNTER — Telehealth: Payer: Self-pay | Admitting: Family Medicine

## 2017-06-01 MED ORDER — BLOOD GLUCOSE MONITOR KIT: PACK | 0 refills | Status: DC

## 2017-06-01 NOTE — Telephone Encounter (Signed)
Patient left message- no details.

## 2017-06-01 NOTE — Telephone Encounter (Signed)
Diabetic shoes

## 2017-06-15 ENCOUNTER — Encounter: Payer: Medicaid Other | Attending: Family Medicine | Admitting: Nutrition

## 2017-06-15 DIAGNOSIS — E1122 Type 2 diabetes mellitus with diabetic chronic kidney disease: Secondary | ICD-10-CM

## 2017-06-15 DIAGNOSIS — N184 Chronic kidney disease, stage 4 (severe): Secondary | ICD-10-CM

## 2017-06-15 DIAGNOSIS — Z9884 Bariatric surgery status: Secondary | ICD-10-CM

## 2017-06-15 DIAGNOSIS — R7303 Prediabetes: Secondary | ICD-10-CM | POA: Diagnosis present

## 2017-06-15 DIAGNOSIS — Z713 Dietary counseling and surveillance: Secondary | ICD-10-CM | POA: Diagnosis not present

## 2017-06-15 DIAGNOSIS — E119 Type 2 diabetes mellitus without complications: Secondary | ICD-10-CM

## 2017-06-15 NOTE — Patient Instructions (Signed)
Goals 1. Follow My Plate 2. Eat three meals per day at times discussed. 3. Cut out processed foods,  Monster drinks, juices and candy 4. Double up on low carb choices 5. Chew each bites 30 times and slow down on eating. 6. Exercise 60 minutes daily 4 times per week Lose 2-3 lbs per month Avoid  protein. Weigh self daily. Use Mrs. Dash instead of salt

## 2017-06-15 NOTE — Progress Notes (Signed)
Medical Nutrition Therapy:  Appt start time: 1500 end time:  3545.   Assessment:  Primary concerns today: Diabetes.  He was 590's  Lbs in 2010. Had gastric bypass in Oct 2010. Got down to 245 lbs with exercising and watching what he was eating. Has had Gastric bypass follow up a few times. Just needs motivation to get back on track. Has gained about 25-30 lbs back. He is now up to basically 280 lbs. Now his BS are going up with A1C of 6%. He admits to eating a lot of candy/sweets lately also.   Eats 2-3 meals per day. Works full time. Sometimes skips meals. Is eating meals fast again. Drinks only water but admits to drinking energy/monster drinks for energy lately.  He is not on any meds for diabetes. Unable to use Metformin due to creatine levels. Need a eGFR level to better assess kidney function. He may benefit from Januvia if appropriate and needed. He notes he has been having some kidney issues. Creatine 1.67    Had history of CVA. Works for [employer]. Wants to get his A1C back down below 5% and lose weight. Diet is excessive in calories at times, low in fresh fruits and low carb vegetables. Use to follow a high protein diet post op surgery.    Lab Results  Component Value Date   HGBA1C 6.0 (H) 02/11/2017   CMP Latest Ref Rng & Units 02/11/2017 02/20/2016 01/25/2015  Glucose 65 - 139 mg/dL 118 153(H) 268(H)  BUN 7 - 25 mg/dL 34(H) 26(H) 45(H)  Creatinine 0.60 - 1.35 mg/dL 1.67(H) 1.67(H) 2.25(H)  Sodium 135 - 146 mmol/L 139 137 133(L)  Potassium 3.5 - 5.3 mmol/L 5.2 3.7 4.6  Chloride 98 - 110 mmol/L 107 104 101  CO2 20 - 32 mmol/L _0 Calcium 8.6 - 10.3 mg/dL 9.8 9.1 8.1(L)  Total Protein 6.1 - 8.1 g/dL 6.9 7.1 5.7(L)  Total Bilirubin 0.2 - 1.2 mg/dL 0.5 1.3(H) 0.7  Alkaline Phos 38 - 126 U/L - 55 157(H)  AST 10 - 40 U/L _1 ALT 9 - 46 U/L 17 19 32   CBC Latest Ref Rng & Units 02/11/2017 02/20/2016 01/25/2015  WBC 3.8 - 10.8 Thousand/uL 7.7 12.2(H) 6.0  Hemoglobin  13.2 - 17.1 g/dL 12.9(L) 13.4 8.5(L)  Hematocrit 38.5 - 50.0 % 38.2(L) 39.8 25.4(L)  Platelets 140 - 400 Thousand/uL 191 198 280     Preferred Learning Style:   No preference indicated   Learning Readiness:   Ready  Change in progress   MEDICATIONS:    DIETARY INTAKE:  24-hr recall:  B ( AM): 2 eggs, piece of cheese, toast with butter and cranberry juice  Snk ( AM): water L ( PM): Rice cake with PB 1, water Snk ( PM): D ( PM): Potato cake, cheese, pinto beans, slaw, water Snk ( PM): 9-10 pm Cherrios Beverages: water  Usual physical activity: walking and rides exercise bike and works [employer]   Estimated energy needs: 1500  calories 170 g carbohydrates 112 g protein 42 g fat  Progress Towards Goal(s):  In progress.   Nutritional Diagnosis:  NB-1.1 Food and nutrition-related knowledge deficit As related to DM and Obesity.  As evidenced by A1C 6% and BMI > 30.    Intervention:  Heart Healthy Diet post gastric bypass, diabetes education provided on My Plate, CHO counting, meal planning, portion sizes, timing of meals, avoiding snacks  target ranges for A1C and blood  sugars, signs/symptoms and treatment of hyper/hypoglycemia, benefits  exercising 30 minutes per day and prevention of weight gain and elevated blood sugars. Post op gastric bypass recommendations. Goals 1. Follow My Plate 2. Eat three meals per day at times discussed. 3. Cut out processed foods,  Monster drinks, juices and candy 4. Double up on low carb choices 5. Chew each bites 30 times and slow down on eating. 6. Exercise 60 minutes daily 4 times per week Lose 2-3 lbs per month Avoid  protein. Weigh self daily. Use Mrs. Dash instead of salt   Teaching Method Utilized:  Visual Auditory Hands on  Handouts given during visit include:  The Plate Method   Meal Plan Card  Post op gastric by pass guidelines.  Barriers to learning/adherence to lifestyle change: limited mobility due  to his history of wt issues.  Demonstrated degree of understanding via:  Teach Back   Monitoring/Evaluation:  Dietary intake, exercise, meal planning, and body weight in 1 month(s).

## 2017-06-16 ENCOUNTER — Encounter: Payer: Self-pay | Admitting: Nutrition

## 2017-06-24 ENCOUNTER — Ambulatory Visit: Payer: Medicaid Other | Admitting: Nutrition

## 2017-06-24 ENCOUNTER — Telehealth: Payer: Self-pay | Admitting: *Deleted

## 2017-06-24 NOTE — Telephone Encounter (Signed)
I did all the paperwork, however, with no diabetes medication, and no findings of neuropathy they may not have been approved

## 2017-06-24 NOTE — Telephone Encounter (Signed)
They probably were not authorized because he has no neuropathy or foot findings, and is on no diabetes medicine

## 2017-06-24 NOTE — Telephone Encounter (Signed)
Patient called about his diabetic shoes, patient asked if Dr Meda Coffee did paperwork for this. Please advise

## 2017-06-25 NOTE — Telephone Encounter (Signed)
Left message per DPR 

## 2017-07-12 ENCOUNTER — Other Ambulatory Visit: Payer: Self-pay | Admitting: Family Medicine

## 2017-07-13 NOTE — Telephone Encounter (Signed)
Seen 12 24 19

## 2017-07-20 DIAGNOSIS — N183 Chronic kidney disease, stage 3 unspecified: Secondary | ICD-10-CM | POA: Insufficient documentation

## 2017-07-20 DIAGNOSIS — R801 Persistent proteinuria, unspecified: Secondary | ICD-10-CM | POA: Insufficient documentation

## 2017-07-20 DIAGNOSIS — K219 Gastro-esophageal reflux disease without esophagitis: Secondary | ICD-10-CM | POA: Insufficient documentation

## 2017-07-20 DIAGNOSIS — Z8673 Personal history of transient ischemic attack (TIA), and cerebral infarction without residual deficits: Secondary | ICD-10-CM | POA: Insufficient documentation

## 2017-08-02 ENCOUNTER — Encounter: Payer: Self-pay | Admitting: Family Medicine

## 2017-08-16 ENCOUNTER — Ambulatory Visit: Payer: Medicaid Other | Admitting: Nutrition

## 2017-08-18 ENCOUNTER — Other Ambulatory Visit: Payer: Self-pay | Admitting: Family Medicine

## 2017-09-09 ENCOUNTER — Ambulatory Visit: Payer: Medicaid Other | Admitting: Family Medicine

## 2017-09-27 ENCOUNTER — Other Ambulatory Visit: Payer: Self-pay | Admitting: Family Medicine

## 2017-12-13 DIAGNOSIS — M79652 Pain in left thigh: Secondary | ICD-10-CM | POA: Diagnosis not present

## 2017-12-13 DIAGNOSIS — Z79891 Long term (current) use of opiate analgesic: Secondary | ICD-10-CM | POA: Diagnosis not present

## 2017-12-13 DIAGNOSIS — M545 Low back pain: Secondary | ICD-10-CM | POA: Diagnosis not present

## 2017-12-13 DIAGNOSIS — G894 Chronic pain syndrome: Secondary | ICD-10-CM | POA: Diagnosis not present

## 2018-01-10 DIAGNOSIS — M545 Low back pain: Secondary | ICD-10-CM | POA: Diagnosis not present

## 2018-01-10 DIAGNOSIS — M79652 Pain in left thigh: Secondary | ICD-10-CM | POA: Diagnosis not present

## 2018-01-10 DIAGNOSIS — Z79891 Long term (current) use of opiate analgesic: Secondary | ICD-10-CM | POA: Diagnosis not present

## 2018-01-10 DIAGNOSIS — G894 Chronic pain syndrome: Secondary | ICD-10-CM | POA: Diagnosis not present

## 2018-01-17 DIAGNOSIS — H25813 Combined forms of age-related cataract, bilateral: Secondary | ICD-10-CM | POA: Diagnosis not present

## 2018-01-17 DIAGNOSIS — H40053 Ocular hypertension, bilateral: Secondary | ICD-10-CM | POA: Diagnosis not present

## 2018-01-17 DIAGNOSIS — E113213 Type 2 diabetes mellitus with mild nonproliferative diabetic retinopathy with macular edema, bilateral: Secondary | ICD-10-CM | POA: Diagnosis not present

## 2018-01-19 DIAGNOSIS — E113211 Type 2 diabetes mellitus with mild nonproliferative diabetic retinopathy with macular edema, right eye: Secondary | ICD-10-CM | POA: Diagnosis not present

## 2018-01-26 ENCOUNTER — Other Ambulatory Visit: Payer: Self-pay

## 2018-01-26 ENCOUNTER — Ambulatory Visit: Payer: Medicaid Other | Admitting: Internal Medicine

## 2018-01-26 VITALS — BP 121/51 | HR 64 | Temp 98.8°F | Wt 290.7 lb

## 2018-01-26 DIAGNOSIS — Z833 Family history of diabetes mellitus: Secondary | ICD-10-CM

## 2018-01-26 DIAGNOSIS — Z79891 Long term (current) use of opiate analgesic: Secondary | ICD-10-CM | POA: Diagnosis not present

## 2018-01-26 DIAGNOSIS — Z0000 Encounter for general adult medical examination without abnormal findings: Secondary | ICD-10-CM

## 2018-01-26 DIAGNOSIS — Z8673 Personal history of transient ischemic attack (TIA), and cerebral infarction without residual deficits: Secondary | ICD-10-CM | POA: Diagnosis not present

## 2018-01-26 DIAGNOSIS — N529 Male erectile dysfunction, unspecified: Secondary | ICD-10-CM | POA: Insufficient documentation

## 2018-01-26 DIAGNOSIS — R7303 Prediabetes: Secondary | ICD-10-CM | POA: Diagnosis not present

## 2018-01-26 DIAGNOSIS — F329 Major depressive disorder, single episode, unspecified: Secondary | ICD-10-CM | POA: Diagnosis not present

## 2018-01-26 DIAGNOSIS — Z86010 Personal history of colonic polyps: Secondary | ICD-10-CM

## 2018-01-26 DIAGNOSIS — Z6841 Body Mass Index (BMI) 40.0 and over, adult: Secondary | ICD-10-CM | POA: Diagnosis not present

## 2018-01-26 DIAGNOSIS — R011 Cardiac murmur, unspecified: Secondary | ICD-10-CM | POA: Diagnosis not present

## 2018-01-26 DIAGNOSIS — Z9884 Bariatric surgery status: Secondary | ICD-10-CM

## 2018-01-26 DIAGNOSIS — Z23 Encounter for immunization: Secondary | ICD-10-CM | POA: Diagnosis not present

## 2018-01-26 DIAGNOSIS — F419 Anxiety disorder, unspecified: Secondary | ICD-10-CM

## 2018-01-26 DIAGNOSIS — E1122 Type 2 diabetes mellitus with diabetic chronic kidney disease: Secondary | ICD-10-CM

## 2018-01-26 DIAGNOSIS — Z79899 Other long term (current) drug therapy: Secondary | ICD-10-CM | POA: Diagnosis not present

## 2018-01-26 DIAGNOSIS — F1120 Opioid dependence, uncomplicated: Secondary | ICD-10-CM

## 2018-01-26 DIAGNOSIS — M1612 Unilateral primary osteoarthritis, left hip: Secondary | ICD-10-CM

## 2018-01-26 DIAGNOSIS — E11319 Type 2 diabetes mellitus with unspecified diabetic retinopathy without macular edema: Secondary | ICD-10-CM | POA: Diagnosis not present

## 2018-01-26 DIAGNOSIS — N183 Chronic kidney disease, stage 3 unspecified: Secondary | ICD-10-CM

## 2018-01-26 DIAGNOSIS — M549 Dorsalgia, unspecified: Secondary | ICD-10-CM

## 2018-01-26 DIAGNOSIS — D126 Benign neoplasm of colon, unspecified: Secondary | ICD-10-CM

## 2018-01-26 DIAGNOSIS — G8929 Other chronic pain: Secondary | ICD-10-CM | POA: Diagnosis not present

## 2018-01-26 HISTORY — DX: Chronic kidney disease, stage 3 unspecified: N18.30

## 2018-01-26 HISTORY — DX: Male erectile dysfunction, unspecified: N52.9

## 2018-01-26 LAB — POCT GLYCOSYLATED HEMOGLOBIN (HGB A1C): Hemoglobin A1C: 6.5 % — AB (ref 4.0–5.6)

## 2018-01-26 LAB — GLUCOSE, CAPILLARY: Glucose-Capillary: 149 mg/dL — ABNORMAL HIGH (ref 70–99)

## 2018-01-26 MED ORDER — OXYCODONE HCL 20 MG PO TABS: 20.0000 mg | ORAL_TABLET | Freq: Three times a day (TID) | ORAL | 0 refills | Status: DC | PRN

## 2018-01-26 MED ORDER — ROSUVASTATIN CALCIUM 10 MG PO TABS: 10.0000 mg | ORAL_TABLET | Freq: Every day | ORAL | 11 refills | Status: DC

## 2018-01-26 MED ORDER — TIZANIDINE HCL 4 MG PO TABS: 4.0000 mg | ORAL_TABLET | Freq: Every day | ORAL | 1 refills | Status: DC

## 2018-01-26 MED ORDER — DULOXETINE HCL 30 MG PO CPEP: 30.0000 mg | ORAL_CAPSULE | Freq: Two times a day (BID) | ORAL | 3 refills | Status: DC

## 2018-01-26 NOTE — Assessment & Plan Note (Signed)
Patient has an history of stage III CKD and follow-up with a nephrologist in [facility]. According to record his last creatinine was 1.4 in February 2019 with GFR of 59. Patient was also found to have mild hyperkalemia at that time, thought to be due to CKD, repeat potassium was within normal limit, he was advised to limit his potassium intake.  According to patient he does not eat bananas anymore and tried to keep his potassium intake low.  We will repeat BMP today. He is due for his follow-up appointment with nephrologist, advised to follow-up with his nephrologist regularly.

## 2018-01-26 NOTE — Assessment & Plan Note (Signed)
Patient is on multiple medications which can cause decreased in testosterone and erectile dysfunction which include opioids and SNRI. He is also obese.  We will do some basic lab which include TSH and total testosterone along with renal function and lipid profile.

## 2018-01-26 NOTE — Assessment & Plan Note (Signed)
Flu shot was provided. 

## 2018-01-26 NOTE — Progress Notes (Addendum)
CC: To establish care.  HPI:  [name] is a 47 y.o. with past medical history as listed below came to the clinic to establish care with [name] as his PCP is retiring.  According to patient he has an history of diabetes for which he used to take multiple medications, also has a stroke in 2006 with no residual deficit, left hip pain secondary to osteoarthritis for which he sees a pain management clinic and prescribed oxycodone and tizanidine by [name].  He was also given duloxetine by his PCP last year which do help with his anxiety, depression and hip pain.  According to patient he used to be very overweight, approximately 600 pound and lost weight after getting gastric bypass surgery in 2010 up to 245 pound.  Now he is liking of the track about his diet and exercise and started eating more bread and cereals and started gaining weight.  His weight today was 290 pounds.  Since he lost weight after his gastric bypass surgery his diabetes become very well controlled and he stopped taking all the diabetic medications.  His last A1c done about a year ago was 6, today it was 6.5.  Patient wants to see a nutritionist to put him back on track regarding his exercise and weight loss.  He sees Dr. [name] for his eyes-patient he has diabetic retinopathy and getting injections for that, his last visit was last week.  He also sees a nephrologist and had an history of CKD stage III, last visit was in February 2019 and according to that visit his creatinine was 1.4 and he was having mild hyperkalemia at 5.6 which normalized on recheck couple of days later.  Patient was advised to use low potassium diet, according to him he has stopped taking bananas and foods rich in potassium.  He was complaining of some erectile dysfunction, and he gets early morning erections, difficulty with maintaining erection and was little frustrated about his sexual life. His PHQ 9 score today was 11.  Addendum.  His labs shows  worsening renal function and normocytic anemia most likely secondary to CKD.  Patient has gastric bypass and should be taken supplements which include iron, B12 and vitamin D.  We will check iron and vitamin B12 level during next follow-up visit.  He needs counseling regarding taking his supplements regularly.  His ASCVD risk is 3.5% which does not required any statins. He is on Crestor 10 mg daily, it is recommended to continue statin with diabetes.  We will discuss with him during next follow-up visit.  Testosterone level and TSH.  Past Medical History:  Diagnosis Date  . Anemia   . Arthritis    "hips" (01/15/2015)  . Blood transfusion without reported diagnosis   . Chronic lower back pain   . Colon polyps   . Depression    denies  . Diabetes mellitus    diet controlled- no meds since wt loss  . Dysrhythmia    s/p surgery bariatric- cardioversion  . GERD (gastroesophageal reflux disease)   . History of gout   . Hyperlipidemia   . Hypertension   . Neuromuscular disorder ([place])   . PONV (postoperative nausea and vomiting)   . Sleep apnea    no longer have to use cpap since wt loss  . Stroke [facility]) 2006   denies residual on 01/15/2015   Family history.  Family members with diabetes.  Social history.  Denies any smoking or illicit drug use.  Occasionally drinks  alcohol during some social events.  Review of Systems: Negative except mentioned in HPI.  Physical Exam:  Vitals:   01/26/18 0900  BP: (!) 121/51  Pulse: 64  Temp: 98.8 F (37.1 C)  TempSrc: Oral  SpO2: 100%  Weight: 290 lb 11.2 oz (131.9 kg)   General: Vital signs reviewed.  Patient is well-developed and well-nourished, in no acute distress and cooperative with exam.  Head: Normocephalic and atraumatic. Eyes: EOMI, conjunctivae normal, no scleral icterus.  Neck: Supple, trachea midline, normal ROM, no JVD, masses, thyromegaly, or carotid bruit present.  Cardiovascular: RRR, S1 normal, S2 normal, soft systolic  murmur, no gallops, or rubs. Pulmonary/Chest: Clear to auscultation bilaterally, no wheezes, rales, or rhonchi. Abdominal: Soft, non-tender, non-distended, BS +,  Extremities: No lower extremity edema bilaterally,  pulses symmetric and intact bilaterally. No cyanosis or clubbing. Neurological: A&O x3, Strength is normal and symmetric bilaterally, cranial nerve II-XII are grossly intact, no focal motor deficit, sensory intact to light touch bilaterally.  Skin: Warm, dry and intact. No rashes or erythema. Psychiatric: Normal mood and affect. speech and behavior is normal. Cognition and memory are normal.  Assessment & Plan:   See Encounters Tab for problem based charting.  Patient discussed with [name].

## 2018-01-26 NOTE — Assessment & Plan Note (Signed)
Patient with history of diabetes, now diet-controlled. A1c done today was 6.5, it was 6.0 a year ago. Patient admitted eating a lot of carb rich diet recently which include a lot of bread and cereals especially Cheerios. Had a long discussion with patient regarding following low-carb diet or going back to Metformin. Patient wants to try diet first and was interested to see Butch Penny.  Referral to see Butch Penny was provided.

## 2018-01-26 NOTE — Assessment & Plan Note (Signed)
Patient has his colonoscopy done in March 2016's which shows tubular adenoma in 2 of the polyps removed, total of 9 polyps.  Recommendation was to repeat in 5 years, which will be 2021.  He denies any recent change in his bowel habits.

## 2018-01-26 NOTE — Assessment & Plan Note (Signed)
S/P Roux-en-Y gastric bypass in 2010. Initially maintaining weight around 242 to 50 pound after his gastric bypass surgery.  For the past 1 year patient started gaining weight due to not following his diet and eating a lot of carb.  Currently he is just taking multivitamin, not on any vitamin D or B12 supplement.  Vitamin D and B12 checked approximately a year ago was within normal limit.  He denies any symptoms of anemia which include exertional dyspnea or excessive fatigue.

## 2018-01-26 NOTE — Assessment & Plan Note (Signed)
Patient gets oxycodone and tizanidine for his chronic left hip pain from a pain management clinic in [place] run by [name].  We had some discussion regarding decreasing his opioid use. He will continue seeing [name]. We will not manage his opioids.

## 2018-01-26 NOTE — Assessment & Plan Note (Addendum)
Patient is not taking any supplement for vitamin D, B12 and iron.  He just take a multivitamin.  He used to take some other supplements in the past which he has stopped taking for many months. His vitamin B12 and D level checked a year ago was within normal limit.  He should be taking supplement because of his Roux-en-Y gastric bypass.  We will check vitamin D level today. Iron and vitamin B12 level can be checked during next follow-up visit. We need to discuss with him regarding taking supplements regularly, which need to be done during next follow-up visit.

## 2018-01-26 NOTE — Patient Instructions (Addendum)
Thank you for visiting clinic today and establishing with [name]. We need to get records from your previous primary care. We are checking some labs.  We will call you with any abnormal results. I am increasing the dose of duloxetine from 30 mg daily to 30 mg twice a day. I am also adding a cholesterol medication to your called Crestor at a low dose, she experience any muscle pain please let me know. Continue follow-up with your pain management clinic, nephrologist and eye doctor. Follow-up in 3 months with your PCP.

## 2018-01-27 LAB — MICROALBUMIN / CREATININE URINE RATIO
CREATININE, UR: 175.9 mg/dL
MICROALB/CREAT RATIO: 359.2 mg/g{creat} — AB (ref 0.0–30.0)
MICROALBUM., U, RANDOM: 631.8 ug/mL

## 2018-01-27 LAB — BMP8+ANION GAP
Anion Gap: 13 mmol/L (ref 10.0–18.0)
BUN / CREAT RATIO: 14 (ref 9–20)
BUN: 26 mg/dL — ABNORMAL HIGH (ref 6–24)
CO2: 23 mmol/L (ref 20–29)
CREATININE: 1.9 mg/dL — AB (ref 0.76–1.27)
Calcium: 8.9 mg/dL (ref 8.7–10.2)
Chloride: 103 mmol/L (ref 96–106)
GFR, EST AFRICAN AMERICAN: 47 mL/min/{1.73_m2} — AB (ref >59)
GFR, EST NON AFRICAN AMERICAN: 41 mL/min/{1.73_m2} — AB (ref >59)
Glucose: 153 mg/dL — ABNORMAL HIGH (ref 65–99)
POTASSIUM: 4.3 mmol/L (ref 3.5–5.2)
SODIUM: 139 mmol/L (ref 134–144)

## 2018-01-27 LAB — LIPID PANEL
CHOL/HDL RATIO: 5.4 ratio — AB (ref 0.0–5.0)
Cholesterol, Total: 188 mg/dL (ref 100–199)
HDL: 35 mg/dL — AB (ref >39)
LDL CALC: 125 mg/dL — AB (ref 0–99)
TRIGLYCERIDES: 140 mg/dL (ref 0–149)
VLDL Cholesterol Cal: 28 mg/dL (ref 5–40)

## 2018-01-27 LAB — CBC
Hematocrit: 34.9 % — ABNORMAL LOW (ref 37.5–51.0)
Hemoglobin: 11.6 g/dL — ABNORMAL LOW (ref 13.0–17.7)
MCH: 27.7 pg (ref 26.6–33.0)
MCHC: 33.2 g/dL (ref 31.5–35.7)
MCV: 83 fL (ref 79–97)
PLATELETS: 222 10*3/uL (ref 150–450)
RBC: 4.19 x10E6/uL (ref 4.14–5.80)
RDW: 13.6 % (ref 12.3–15.4)
WBC: 7.2 10*3/uL (ref 3.4–10.8)

## 2018-01-27 LAB — TESTOSTERONE: Testosterone: 476 ng/dL (ref 264–916)

## 2018-01-27 LAB — TSH: TSH: 1.25 u[IU]/mL (ref 0.450–4.500)

## 2018-01-27 LAB — PROTEIN,TOTAL,URINE: Protein, Ur: 111.5 mg/dL

## 2018-01-27 NOTE — Progress Notes (Signed)
Internal Medicine Clinic Attending  Case discussed with Dr. Amin at the time of the visit.  We reviewed the resident's history and exam and pertinent patient test results.  I agree with the assessment, diagnosis, and plan of care documented in the resident's note.    

## 2018-01-28 LAB — VITAMIN D 25 HYDROXY (VIT D DEFICIENCY, FRACTURES): Vit D, 25-Hydroxy: 32.3 ng/mL (ref 30.0–100.0)

## 2018-01-28 LAB — SPECIMEN STATUS REPORT

## 2018-02-02 ENCOUNTER — Ambulatory Visit: Payer: Medicaid Other | Admitting: Dietician

## 2018-02-02 ENCOUNTER — Encounter: Payer: Self-pay | Admitting: Dietician

## 2018-02-02 DIAGNOSIS — R7303 Prediabetes: Secondary | ICD-10-CM

## 2018-02-02 NOTE — Patient Instructions (Addendum)
Your goal:   I will eat 100% whole grain/whole wheat bread instead of white  And try to limit myself to two pieces a day for the following 2 weeks.   You have to buy the bread first.   If you Make green tea at home to help you drink less energy drinks and gatorade that is icing on the cake.   Stay off the cookies and candy

## 2018-02-02 NOTE — Progress Notes (Signed)
Documentation:  Assisted Mr. Harlin with making a plan for weight loss to prevent return of his diabetes.  He recently began shopping and cooking for himself due to his living situation and is stressed by that, his health and finances. I suggested he may need to address his sleep apnea to attain his goals for weight loss and diabetes prevention.  Sleep- He reports having been diagnosed with sleep apnea previously, wearing the Cpap for a year and has not worn it since. He currently reports snoring, decreased energy, weight gain, awakens at night not knowing what awakens him and vivid dreams. He takes melatonin to fall to sleep and energy drinks during the day. He seldon feel rested upon awakening.  Physical activity- total computer/phone/Tv time per day ~ 4 hours, just began a job at [employer] that is physical 3-4 days a week for 6-8 hours a day  Meal planning: pattern- eats 2 meals a day and 2 snacks. He is aware of better food choices he could make/what he should be eating. Has stopped buying candy/cookies, eats out at least one time a week  healthier and decreased portions  Vitamins and medicines: asked him to stop taking vitamin E supplements, he takes two multivitamins with mineral for men and vitamin B12 supplements.  Follow up: 2 weeks to follow up on goals set at today's visit.  Debera Lat, RD 02/02/2018 5:34 PM.

## 2018-02-07 DIAGNOSIS — G894 Chronic pain syndrome: Secondary | ICD-10-CM | POA: Diagnosis not present

## 2018-02-07 DIAGNOSIS — M545 Low back pain: Secondary | ICD-10-CM | POA: Diagnosis not present

## 2018-02-07 DIAGNOSIS — Z79891 Long term (current) use of opiate analgesic: Secondary | ICD-10-CM | POA: Diagnosis not present

## 2018-02-07 DIAGNOSIS — M79652 Pain in left thigh: Secondary | ICD-10-CM | POA: Diagnosis not present

## 2018-02-16 ENCOUNTER — Ambulatory Visit: Payer: Medicaid Other | Admitting: Dietician

## 2018-03-07 DIAGNOSIS — Z79891 Long term (current) use of opiate analgesic: Secondary | ICD-10-CM | POA: Diagnosis not present

## 2018-03-07 DIAGNOSIS — M545 Low back pain: Secondary | ICD-10-CM | POA: Diagnosis not present

## 2018-03-07 DIAGNOSIS — G894 Chronic pain syndrome: Secondary | ICD-10-CM | POA: Diagnosis not present

## 2018-03-07 DIAGNOSIS — M79652 Pain in left thigh: Secondary | ICD-10-CM | POA: Diagnosis not present

## 2018-03-23 DIAGNOSIS — E113311 Type 2 diabetes mellitus with moderate nonproliferative diabetic retinopathy with macular edema, right eye: Secondary | ICD-10-CM | POA: Diagnosis not present

## 2018-04-04 DIAGNOSIS — M545 Low back pain: Secondary | ICD-10-CM | POA: Diagnosis not present

## 2018-04-04 DIAGNOSIS — G894 Chronic pain syndrome: Secondary | ICD-10-CM | POA: Diagnosis not present

## 2018-04-04 DIAGNOSIS — I10 Essential (primary) hypertension: Secondary | ICD-10-CM | POA: Diagnosis not present

## 2018-04-04 DIAGNOSIS — Z79891 Long term (current) use of opiate analgesic: Secondary | ICD-10-CM | POA: Diagnosis not present

## 2018-04-04 DIAGNOSIS — E7801 Familial hypercholesterolemia: Secondary | ICD-10-CM | POA: Diagnosis not present

## 2018-04-04 DIAGNOSIS — E118 Type 2 diabetes mellitus with unspecified complications: Secondary | ICD-10-CM | POA: Diagnosis not present

## 2018-04-04 DIAGNOSIS — M79652 Pain in left thigh: Secondary | ICD-10-CM | POA: Diagnosis not present

## 2018-04-13 DIAGNOSIS — E113312 Type 2 diabetes mellitus with moderate nonproliferative diabetic retinopathy with macular edema, left eye: Secondary | ICD-10-CM | POA: Diagnosis not present

## 2018-04-27 DIAGNOSIS — E113311 Type 2 diabetes mellitus with moderate nonproliferative diabetic retinopathy with macular edema, right eye: Secondary | ICD-10-CM | POA: Diagnosis not present

## 2018-05-02 DIAGNOSIS — M545 Low back pain: Secondary | ICD-10-CM | POA: Diagnosis not present

## 2018-05-02 DIAGNOSIS — G894 Chronic pain syndrome: Secondary | ICD-10-CM | POA: Diagnosis not present

## 2018-05-02 DIAGNOSIS — M79652 Pain in left thigh: Secondary | ICD-10-CM | POA: Diagnosis not present

## 2018-05-02 DIAGNOSIS — Z79891 Long term (current) use of opiate analgesic: Secondary | ICD-10-CM | POA: Diagnosis not present

## 2018-06-01 DIAGNOSIS — M79652 Pain in left thigh: Secondary | ICD-10-CM | POA: Diagnosis not present

## 2018-06-01 DIAGNOSIS — G894 Chronic pain syndrome: Secondary | ICD-10-CM | POA: Diagnosis not present

## 2018-06-01 DIAGNOSIS — M545 Low back pain: Secondary | ICD-10-CM | POA: Diagnosis not present

## 2018-06-01 DIAGNOSIS — Z79891 Long term (current) use of opiate analgesic: Secondary | ICD-10-CM | POA: Diagnosis not present

## 2018-06-07 ENCOUNTER — Ambulatory Visit: Payer: Medicaid Other

## 2018-06-09 ENCOUNTER — Other Ambulatory Visit: Payer: Self-pay

## 2018-06-09 ENCOUNTER — Ambulatory Visit: Payer: Medicaid Other | Admitting: Internal Medicine

## 2018-06-09 ENCOUNTER — Encounter (INDEPENDENT_AMBULATORY_CARE_PROVIDER_SITE_OTHER): Payer: Self-pay

## 2018-06-09 VITALS — BP 132/69 | HR 66 | Temp 98.1°F | Ht 68.0 in | Wt 292.3 lb

## 2018-06-09 DIAGNOSIS — Z791 Long term (current) use of non-steroidal anti-inflammatories (NSAID): Secondary | ICD-10-CM

## 2018-06-09 DIAGNOSIS — Z79891 Long term (current) use of opiate analgesic: Secondary | ICD-10-CM

## 2018-06-09 DIAGNOSIS — N529 Male erectile dysfunction, unspecified: Secondary | ICD-10-CM | POA: Diagnosis not present

## 2018-06-09 DIAGNOSIS — N183 Chronic kidney disease, stage 3 unspecified: Secondary | ICD-10-CM

## 2018-06-09 DIAGNOSIS — Z9884 Bariatric surgery status: Secondary | ICD-10-CM

## 2018-06-09 DIAGNOSIS — Z8673 Personal history of transient ischemic attack (TIA), and cerebral infarction without residual deficits: Secondary | ICD-10-CM | POA: Diagnosis not present

## 2018-06-09 DIAGNOSIS — Z79899 Other long term (current) drug therapy: Secondary | ICD-10-CM

## 2018-06-09 DIAGNOSIS — Z0000 Encounter for general adult medical examination without abnormal findings: Secondary | ICD-10-CM

## 2018-06-09 DIAGNOSIS — I129 Hypertensive chronic kidney disease with stage 1 through stage 4 chronic kidney disease, or unspecified chronic kidney disease: Secondary | ICD-10-CM | POA: Diagnosis not present

## 2018-06-09 DIAGNOSIS — K909 Intestinal malabsorption, unspecified: Secondary | ICD-10-CM

## 2018-06-09 DIAGNOSIS — K219 Gastro-esophageal reflux disease without esophagitis: Secondary | ICD-10-CM

## 2018-06-09 DIAGNOSIS — Z6841 Body Mass Index (BMI) 40.0 and over, adult: Secondary | ICD-10-CM

## 2018-06-09 DIAGNOSIS — R7303 Prediabetes: Secondary | ICD-10-CM

## 2018-06-09 DIAGNOSIS — N522 Drug-induced erectile dysfunction: Secondary | ICD-10-CM

## 2018-06-09 DIAGNOSIS — E785 Hyperlipidemia, unspecified: Secondary | ICD-10-CM

## 2018-06-09 DIAGNOSIS — D509 Iron deficiency anemia, unspecified: Secondary | ICD-10-CM

## 2018-06-09 LAB — POCT GLYCOSYLATED HEMOGLOBIN (HGB A1C): Hemoglobin A1C: 6.6 % — AB (ref 4.0–5.6)

## 2018-06-09 LAB — GLUCOSE, CAPILLARY: Glucose-Capillary: 114 mg/dL — ABNORMAL HIGH (ref 70–99)

## 2018-06-09 MED ORDER — LISINOPRIL 5 MG PO TABS: 5.0000 mg | ORAL_TABLET | Freq: Every day | ORAL | 0 refills | Status: DC

## 2018-06-09 NOTE — Patient Instructions (Addendum)
Thank you for allowing [name] to provide your care today. Today we discussed your fatigue.    I have ordered iron panel, cbc, bmp and hemoglobin a1c labs for you. I will call if any are abnormal.    Today we are starting you on lisinopril, a blood pressure medication, for your kidneys.    Please follow-up in 6 months.    Should you have any questions or concerns please call the internal medicine clinic at [phone].     Diabetes Mellitus and Nutrition, Adult When you have diabetes (diabetes mellitus), it is very important to have healthy eating habits because your blood sugar (glucose) levels are greatly affected by what you eat and drink. Eating healthy foods in the appropriate amounts, at about the same times every day, can help you:  Control your blood glucose.  Lower your risk of heart disease.  Improve your blood pressure.  Reach or maintain a healthy weight. Every person with diabetes is different, and each person has different needs for a meal plan. Your health care provider may recommend that you work with a diet and nutrition specialist (dietitian) to make a meal plan that is best for you. Your meal plan may vary depending on factors such as:  The calories you need.  The medicines you take.  Your weight.  Your blood glucose, blood pressure, and cholesterol levels.  Your activity level.  Other health conditions you have, such as heart or kidney disease. How do carbohydrates affect me? Carbohydrates, also called carbs, affect your blood glucose level more than any other type of food. Eating carbs naturally raises the amount of glucose in your blood. Carb counting is a method for keeping track of how many carbs you eat. Counting carbs is important to keep your blood glucose at a healthy level, especially if you use insulin or take certain oral diabetes medicines. It is important to know how many carbs you can safely have in each meal. This is different for every person. Your  dietitian can help you calculate how many carbs you should have at each meal and for each snack. Foods that contain carbs include:  Bread, cereal, rice, pasta, and crackers.  Potatoes and corn.  Peas, beans, and lentils.  Milk and yogurt.  Fruit and juice.  Desserts, such as cakes, cookies, ice cream, and candy. How does alcohol affect me? Alcohol can cause a sudden decrease in blood glucose (hypoglycemia), especially if you use insulin or take certain oral diabetes medicines. Hypoglycemia can be a life-threatening condition. Symptoms of hypoglycemia (sleepiness, dizziness, and confusion) are similar to symptoms of having too much alcohol. If your health care provider says that alcohol is safe for you, follow these guidelines:  Limit alcohol intake to no more than 1 drink per day for nonpregnant women and 2 drinks per day for men. One drink equals 12 oz of beer, 5 oz of wine, or 1 oz of hard liquor.  Do not drink on an empty stomach.  Keep yourself hydrated with water, diet soda, or unsweetened iced tea.  Keep in mind that regular soda, juice, and other mixers may contain a lot of sugar and must be counted as carbs. What are tips for following this plan?  Reading food labels  Start by checking the serving size on the "Nutrition Facts" label of packaged foods and drinks. The amount of calories, carbs, fats, and other nutrients listed on the label is based on one serving of the item. Many items contain more than  one serving per package.  Check the total grams (g) of carbs in one serving. You can calculate the number of servings of carbs in one serving by dividing the total carbs by 15. For example, if a food has 30 g of total carbs, it would be equal to 2 servings of carbs.  Check the number of grams (g) of saturated and trans fats in one serving. Choose foods that have low or no amount of these fats.  Check the number of milligrams (mg) of salt (sodium) in one serving. Most people  should limit total sodium intake to less than 2,300 mg per day.  Always check the nutrition information of foods labeled as "low-fat" or "nonfat". These foods may be higher in added sugar or refined carbs and should be avoided.  Talk to your dietitian to identify your daily goals for nutrients listed on the label. Shopping  Avoid buying canned, premade, or processed foods. These foods tend to be high in fat, sodium, and added sugar.  Shop around the outside edge of the grocery store. This includes fresh fruits and vegetables, bulk grains, fresh meats, and fresh dairy. Cooking  Use low-heat cooking methods, such as baking, instead of high-heat cooking methods like deep frying.  Cook using healthy oils, such as olive, canola, or sunflower oil.  Avoid cooking with butter, cream, or high-fat meats. Meal planning  Eat meals and snacks regularly, preferably at the same times every day. Avoid going long periods of time without eating.  Eat foods high in fiber, such as fresh fruits, vegetables, beans, and whole grains. Talk to your dietitian about how many servings of carbs you can eat at each meal.  Eat 4-6 ounces (oz) of lean protein each day, such as lean meat, chicken, fish, eggs, or tofu. One oz of lean protein is equal to: ? 1 oz of meat, chicken, or fish. ? 1 egg. ?  cup of tofu.  Eat some foods each day that contain healthy fats, such as avocado, nuts, seeds, and fish. Lifestyle  Check your blood glucose regularly.  Exercise regularly as told by your health care provider. This may include: ? 150 minutes of moderate-intensity or vigorous-intensity exercise each week. This could be brisk walking, biking, or water aerobics. ? Stretching and doing strength exercises, such as yoga or weightlifting, at least 2 times a week.  Take medicines as told by your health care provider.  Do not use any products that contain nicotine or tobacco, such as cigarettes and e-cigarettes. If you need  help quitting, ask your health care provider.  Work with a Social worker or diabetes educator to identify strategies to manage stress and any emotional and social challenges. Questions to ask a health care provider  Do I need to meet with a diabetes educator?  Do I need to meet with a dietitian?  What number can I call if I have questions?  When are the best times to check my blood glucose? Where to find more information:  American Diabetes Association: diabetes.org  Academy of Nutrition and Dietetics: www.eatright.[employer] of Diabetes and Digestive and Kidney Diseases (NIH): [employer] Summary  A healthy meal plan will help you control your blood glucose and maintain a healthy lifestyle.  Working with a diet and nutrition specialist (dietitian) can help you make a meal plan that is best for you.  Keep in mind that carbohydrates (carbs) and alcohol have immediate effects on your blood glucose levels. It is important to count carbs and  to use alcohol carefully. This information is not intended to replace advice given to you by your health care provider. Make sure you discuss any questions you have with your health care provider. Document Released: 02/05/2005 Document Revised: 12/09/2016 Document Reviewed: 06/15/2016 Elsevier Interactive Patient Education  2019 [employer].

## 2018-06-09 NOTE — Progress Notes (Signed)
CC:  Chronic Kidney Disease  HPI: [name] is a 48 y.o. M w/ PMH of morbid obesity s/p gastri bypass, GERD, HLD, HTN, CVA and CKD3 presenting to [facility] for management of his chronic conditions.  He states that since his last visit he has noticed his weight start to rise.  He mentions that he he has been trying to stick to a low carb diet but states he loves chocolate.  He had questions about whether a "diet pill" would be appropriate for him.  He had a remote history of gastric bypass and had been losing significant weight that he had regained over time.  He is also continued to endorse erectile dysfunction and was wondering if losing weight would help with that as well.  He denies any chest pain palpitations dyspnea headache nausea vomiting diarrhea constipation.  He denies any polyuria, polydipsia, polyphagia.  Past Medical History:  Diagnosis Date  . Anemia   . Arthritis    "hips" (01/15/2015)  . Blood transfusion without reported diagnosis   . Chronic lower back pain   . Colon polyps   . Depression    denies  . Diabetes mellitus    diet controlled- no meds since wt loss  . Dysrhythmia    s/p surgery bariatric- cardioversion  . GERD (gastroesophageal reflux disease)   . History of gout   . Hyperlipidemia   . Hypertension   . Neuromuscular disorder ([place])   . PONV (postoperative nausea and vomiting)   . Sleep apnea    no longer have to use cpap since wt loss  . Stage 3 chronic kidney disease ([place]) 01/26/2018  . Stroke [facility]) 2006   denies residual on 01/15/2015   Review of Systems: Review of Systems  Constitutional: Negative for chills, fever, malaise/fatigue and weight loss.  Eyes: Negative for blurred vision.  Respiratory: Negative for shortness of breath.   Cardiovascular: Negative for chest pain, palpitations and leg swelling.  Gastrointestinal: Negative for constipation, diarrhea, nausea and vomiting.  Genitourinary: Negative for dysuria, frequency and urgency.    Musculoskeletal: Positive for back pain.  Neurological: Negative for sensory change and weakness.    Physical Exam: Vitals:   06/09/18 0904  BP: 132/69  Pulse: 66  Temp: 98.1 F (36.7 C)  TempSrc: Oral  SpO2: 100%  Weight: 292 lb 4.8 oz (132.6 kg)  Height: 5\' 8"  (1.727 m)   Physical Exam  Constitutional: He is oriented to person, place, and time. He appears well-developed and well-nourished.  Morbidly obese  HENT:  Mouth/Throat: Oropharynx is clear and moist.  Eyes: Conjunctivae are normal. No scleral icterus.  Neck: Normal range of motion. Neck supple.  Cardiovascular: Normal rate, regular rhythm, normal heart sounds and intact distal pulses.  No murmur heard. Respiratory: Effort normal and breath sounds normal. He has no wheezes. He has no rales.  GI: Soft. Bowel sounds are normal. He exhibits distension. There is no abdominal tenderness.  Musculoskeletal: Normal range of motion.        General: Edema (trace pitting edema around ankles bilaterally) present.  Neurological: He is alert and oriented to person, place, and time. No cranial nerve deficit.  Skin: Skin is warm and dry.    Assessment & Plan:   Gastric bypass status for obesity Hx of Roux-en-Y gastric bypass ~2009 with loss of >300 pounds. Currently on multivitamins. No recent labs for absorptive disorders such as iron , B12 deficiencies. Vitamin D level checked last visit. Denies any significant fatigue, weakness, palpitations,  numbness or tingling. Will perform follow up labs today.  - Iron, TIBC, Ferritin - B12 - CBC  Erectile dysfunction Continuing to endorse erectile dysfunction. Most likely 2/2 morbid obesity. Testosterone checked during last visit WNL. Onset appear to be around same time as initiation of his anti-depressants. Discussed the option of reducing anti-depressant dose prior to initiating PDE5-inhibitor therapy. He states his antidepressants have been helping a lot with his mood and requested time  to think it over.  - Hold off on sildenafil for now  Stage 3 chronic kidney disease (HCC) BMP Latest Ref Rng & Units 06/09/2018 01/26/2018 02/11/2017  Glucose 65 - 99 mg/dL 121(H) 153(H) 118  BUN 6 - 24 mg/dL 28(H) 26(H) 34(H)  Creatinine 0.76 - 1.27 mg/dL 2.58(H) 1.90(H) 1.67(H)  BUN/Creat Ratio 9 - 20 11 14 20   Sodium 134 - 144 mmol/L 141 139 139  Potassium 3.5 - 5.2 mmol/L 5.3(H) 4.3 5.2  Chloride 96 - 106 mmol/L 105 103 107  CO2 20 - 29 mmol/L 21 23 27   Calcium 8.7 - 10.2 mg/dL 9.8 8.9 9.8   Appears to have worsening kidney function 2/2 diabetic nephropathy vs medication-induced AKI. He states he has not followed up with his nephrologist in 'a long time.' Has significant joint pains and takes multiple medications for pain including opioids and NSAIDs. Discussed the need to stop his NSAID use. Also considering his diabetic nephropathy he should be on an ACEi  - Start lisinopril 5mg  - Strongly advised to stop NSAID use - Recommend f/u with nephrology   Healthcare maintenance Agrees to get HIV screening done at this visit  Prediabetes Lab Results  Component Value Date   HGBA1C 6.6 (A) 06/09/2018   Hx of diabetes resolved after gastric bypass and weight loss. Currently eating sweets, including chocolate, despite strong recommendations last visit to avoid such foods. Discussed importance of adherence to low carb diet and avoiding simple carbs. Repeat A1c today show no significant difference at 6.6 (6.5 last visit). Discussed again option of starting metformin. He states he will 'think about it.'   - C/w monitor hgb a1c and encourage adherence to diabetic diet  Patient discussed with Dr. Dareen Piano  -Gilberto Better, PGY1

## 2018-06-10 ENCOUNTER — Encounter: Payer: Self-pay | Admitting: Internal Medicine

## 2018-06-10 ENCOUNTER — Telehealth: Payer: Self-pay | Admitting: Internal Medicine

## 2018-06-10 LAB — BMP8+ANION GAP
Anion Gap: 15 mmol/L (ref 10.0–18.0)
BUN/Creatinine Ratio: 11 (ref 9–20)
BUN: 28 mg/dL — ABNORMAL HIGH (ref 6–24)
CO2: 21 mmol/L (ref 20–29)
Calcium: 9.8 mg/dL (ref 8.7–10.2)
Chloride: 105 mmol/L (ref 96–106)
Creatinine, Ser: 2.58 mg/dL — ABNORMAL HIGH (ref 0.76–1.27)
GFR calc Af Amer: 33 mL/min/{1.73_m2} — ABNORMAL LOW (ref >59)
GFR calc non Af Amer: 28 mL/min/{1.73_m2} — ABNORMAL LOW (ref >59)
Glucose: 121 mg/dL — ABNORMAL HIGH (ref 65–99)
Potassium: 5.3 mmol/L — ABNORMAL HIGH (ref 3.5–5.2)
SODIUM: 141 mmol/L (ref 134–144)

## 2018-06-10 LAB — CBC
Hematocrit: 39.8 % (ref 37.5–51.0)
Hemoglobin: 12.9 g/dL — ABNORMAL LOW (ref 13.0–17.7)
MCH: 26.4 pg — ABNORMAL LOW (ref 26.6–33.0)
MCHC: 32.4 g/dL (ref 31.5–35.7)
MCV: 81 fL (ref 79–97)
PLATELETS: 282 10*3/uL (ref 150–450)
RBC: 4.89 x10E6/uL (ref 4.14–5.80)
RDW: 14.1 % (ref 11.6–15.4)
WBC: 9.6 10*3/uL (ref 3.4–10.8)

## 2018-06-10 LAB — FERRITIN: Ferritin: 15 ng/mL — ABNORMAL LOW (ref 30–400)

## 2018-06-10 LAB — IRON AND TIBC
Iron Saturation: 24 % (ref 15–55)
Iron: 88 ug/dL (ref 38–169)
TIBC: 366 ug/dL (ref 250–450)
UIBC: 278 ug/dL (ref 111–343)

## 2018-06-10 LAB — HIV ANTIBODY (ROUTINE TESTING W REFLEX): HIV Screen 4th Generation wRfx: NONREACTIVE

## 2018-06-10 LAB — VITAMIN B12

## 2018-06-10 NOTE — Telephone Encounter (Signed)
Called and discussed lab results and re-emphasized need to stop NSAID. [name] expressed understanding and agrees to stop taking Alleve and follow up with his nephrologist.

## 2018-06-10 NOTE — Assessment & Plan Note (Signed)
Lab Results  Component Value Date   HGBA1C 6.6 (A) 06/09/2018   Hx of diabetes resolved after gastric bypass and weight loss. Currently eating sweets, including chocolate, despite strong recommendations last visit to avoid such foods. Discussed importance of adherence to low carb diet and avoiding simple carbs. Repeat A1c today show no significant difference at 6.6 (6.5 last visit). Discussed again option of starting metformin. He states he will 'think about it.'   - C/w monitor hgb a1c and encourage adherence to diabetic diet

## 2018-06-10 NOTE — Assessment & Plan Note (Addendum)
BMP Latest Ref Rng & Units 06/09/2018 01/26/2018 02/11/2017  Glucose 65 - 99 mg/dL 121(H) 153(H) 118  BUN 6 - 24 mg/dL 28(H) 26(H) 34(H)  Creatinine 0.76 - 1.27 mg/dL 2.58(H) 1.90(H) 1.67(H)  BUN/Creat Ratio 9 - 20 11 14 20   Sodium 134 - 144 mmol/L 141 139 139  Potassium 3.5 - 5.2 mmol/L 5.3(H) 4.3 5.2  Chloride 96 - 106 mmol/L 105 103 107  CO2 20 - 29 mmol/L 21 23 27   Calcium 8.7 - 10.2 mg/dL 9.8 8.9 9.8   Appears to have worsening kidney function 2/2 diabetic nephropathy vs medication-induced AKI. He states he has not followed up with his nephrologist in 'a long time.' Has significant joint pains and takes multiple medications for pain including opioids and NSAIDs. Discussed the need to stop his NSAID use. Also considering his diabetic nephropathy he should be on an ACEi  - Start lisinopril 5mg  - Strongly advised to stop NSAID use - Recommend f/u with nephrology  Addendum: Will advise to stop lisinopril until AKI is resolved

## 2018-06-10 NOTE — Assessment & Plan Note (Signed)
Agrees to get HIV screening done at this visit

## 2018-06-10 NOTE — Assessment & Plan Note (Addendum)
Hx of Roux-en-Y gastric bypass ~2009 with loss of >300 pounds. Currently on multivitamins. No recent labs for absorptive disorders such as iron , B12 deficiencies. Vitamin D level checked last visit. Denies any significant fatigue, weakness, palpitations, numbness or tingling. Will perform follow up labs today.  - Iron, TIBC, Ferritin - B12 - CBC

## 2018-06-10 NOTE — Assessment & Plan Note (Addendum)
Continuing to endorse erectile dysfunction. Most likely 2/2 morbid obesity. Testosterone checked during last visit WNL. Onset appear to be around same time as initiation of his anti-depressants. Discussed the option of reducing anti-depressant dose prior to initiating PDE5-inhibitor therapy. He states his antidepressants have been helping a lot with his mood and requested time to think it over.  - Hold off on sildenafil for now

## 2018-06-13 NOTE — Progress Notes (Signed)
Internal Medicine Clinic Attending ° °Case discussed with Dr. Lee at the time of the visit.  We reviewed the resident’s history and exam and pertinent patient test results.  I agree with the assessment, diagnosis, and plan of care documented in the resident’s note.  °

## 2018-06-13 NOTE — Addendum Note (Signed)
Addended by: Aldine Contes on: 06/13/2018 07:59 PM   Modules accepted: Level of Service

## 2018-06-14 ENCOUNTER — Telehealth: Payer: Self-pay | Admitting: Internal Medicine

## 2018-06-14 NOTE — Telephone Encounter (Signed)
Spoke with patient about his low ferritin level and importance of replenishing his iron. Discussed about possibility of short stay admission for Iron admission. He mentions that he would like to try oral iron supplementation first. Considering his hx of bariatric surgery informed the patient that IV iron would be much more effective in replenishing his iron. He states he will check with his work-place about taking time off for short stay but expects that he will not be able to get it done for few weeks. All other concerns addressed.

## 2018-06-14 NOTE — Addendum Note (Signed)
Addended by: Mosetta Anis on: 06/14/2018 05:33 PM   Modules accepted: Orders

## 2018-06-14 NOTE — Telephone Encounter (Signed)
Attempted to call pt about stopping Ace-I during AKI and starting iron infusion for low ferritin. Patient did not pick up. Left voicemail requesting call-back.

## 2018-06-16 ENCOUNTER — Other Ambulatory Visit (HOSPITAL_COMMUNITY): Payer: Self-pay | Admitting: *Deleted

## 2018-06-17 ENCOUNTER — Encounter (HOSPITAL_COMMUNITY)
Admission: RE | Admit: 2018-06-17 | Discharge: 2018-06-17 | Disposition: A | Discharge status: Home / Self Care | Payer: Medicaid Other | Source: Ambulatory Visit | Attending: Oncology | Admitting: Oncology

## 2018-06-17 DIAGNOSIS — Z9884 Bariatric surgery status: Secondary | ICD-10-CM | POA: Insufficient documentation

## 2018-06-17 DIAGNOSIS — D509 Iron deficiency anemia, unspecified: Secondary | ICD-10-CM | POA: Diagnosis present

## 2018-06-17 DIAGNOSIS — K909 Intestinal malabsorption, unspecified: Secondary | ICD-10-CM | POA: Insufficient documentation

## 2018-06-17 MED ORDER — SODIUM CHLORIDE 0.9 % IV SOLN
510.0000 mg | INTRAVENOUS | Status: DC
  Administered 2018-06-17: 510 mg via INTRAVENOUS
  Filled 2018-06-17: qty 510
  Filled 2018-06-17: qty 17

## 2018-06-22 ENCOUNTER — Ambulatory Visit (HOSPITAL_COMMUNITY)
Admission: RE | Admit: 2018-06-22 | Discharge: 2018-06-22 | Disposition: A | Discharge status: Home / Self Care | Payer: Medicaid Other | Source: Ambulatory Visit | Attending: Oncology | Admitting: Oncology

## 2018-06-22 DIAGNOSIS — D509 Iron deficiency anemia, unspecified: Secondary | ICD-10-CM | POA: Diagnosis present

## 2018-06-22 DIAGNOSIS — K909 Intestinal malabsorption, unspecified: Secondary | ICD-10-CM | POA: Insufficient documentation

## 2018-06-22 DIAGNOSIS — Z9884 Bariatric surgery status: Secondary | ICD-10-CM | POA: Diagnosis present

## 2018-06-22 MED ORDER — SODIUM CHLORIDE 0.9 % IV SOLN
510.0000 mg | Freq: Once | INTRAVENOUS | Status: AC
  Administered 2018-06-22: 510 mg via INTRAVENOUS
  Filled 2018-06-22: qty 17

## 2018-06-23 ENCOUNTER — Encounter (HOSPITAL_COMMUNITY): Payer: Medicaid Other

## 2018-06-24 ENCOUNTER — Encounter (HOSPITAL_COMMUNITY): Payer: Medicaid Other

## 2018-06-30 ENCOUNTER — Ambulatory Visit: Payer: Medicaid Other

## 2018-07-01 DIAGNOSIS — G894 Chronic pain syndrome: Secondary | ICD-10-CM | POA: Diagnosis not present

## 2018-07-01 DIAGNOSIS — Z79891 Long term (current) use of opiate analgesic: Secondary | ICD-10-CM | POA: Diagnosis not present

## 2018-07-01 DIAGNOSIS — M545 Low back pain: Secondary | ICD-10-CM | POA: Diagnosis not present

## 2018-07-01 DIAGNOSIS — M79652 Pain in left thigh: Secondary | ICD-10-CM | POA: Diagnosis not present

## 2018-07-15 ENCOUNTER — Encounter (HOSPITAL_COMMUNITY): Payer: Self-pay

## 2018-07-15 ENCOUNTER — Ambulatory Visit (HOSPITAL_COMMUNITY)
Admission: EM | Admit: 2018-07-15 | Discharge: 2018-07-15 | Disposition: A | Discharge status: Home / Self Care | Payer: Medicaid Other | Attending: Family Medicine | Admitting: Family Medicine

## 2018-07-15 DIAGNOSIS — G8929 Other chronic pain: Secondary | ICD-10-CM | POA: Diagnosis not present

## 2018-07-15 DIAGNOSIS — I129 Hypertensive chronic kidney disease with stage 1 through stage 4 chronic kidney disease, or unspecified chronic kidney disease: Secondary | ICD-10-CM | POA: Diagnosis not present

## 2018-07-15 DIAGNOSIS — Z9884 Bariatric surgery status: Secondary | ICD-10-CM | POA: Diagnosis not present

## 2018-07-15 DIAGNOSIS — M25552 Pain in left hip: Secondary | ICD-10-CM

## 2018-07-15 DIAGNOSIS — M25551 Pain in right hip: Secondary | ICD-10-CM

## 2018-07-15 DIAGNOSIS — M545 Low back pain: Secondary | ICD-10-CM

## 2018-07-15 DIAGNOSIS — N183 Chronic kidney disease, stage 3 (moderate): Secondary | ICD-10-CM | POA: Diagnosis not present

## 2018-07-15 MED ORDER — PREDNISONE 10 MG (21) PO TBPK: ORAL_TABLET | Freq: Every day | ORAL | 0 refills | Status: DC

## 2018-07-15 MED ORDER — METHYLPREDNISOLONE SODIUM SUCC 125 MG IJ SOLR
INTRAMUSCULAR | Status: AC
  Filled 2018-07-15: qty 2

## 2018-07-15 MED ORDER — METHYLPREDNISOLONE SODIUM SUCC 125 MG IJ SOLR
80.0000 mg | Freq: Once | INTRAMUSCULAR | Status: AC
  Administered 2018-07-15: 80 mg via INTRAMUSCULAR

## 2018-07-15 NOTE — ED Triage Notes (Signed)
Pt present back and leg pain, symptoms has been going on for over two weeks. Pt states he is having difficulty standing and walking.

## 2018-07-15 NOTE — ED Provider Notes (Signed)
[place]    CSN: [id number] Arrival date & time: 07/15/18  1556     History   Chief Complaint Chief Complaint  Patient presents with  . Back Pain  . Leg Pain    HPI [name] is a 48 y.o. male.   48 year old male comes in for evaluation of acute on chronic bilateral hip pain since 2 weeks ago. States has had more difficulty walking due to the pain. Pain is bilateral hip, less with sitting, worse with ROM and weight bearing. Denies injury/trauma. States has to "shuffle" his legs to walk, and now using a cane. Pain and radiate down his leg. Denies saddle anesthesia, loss of bladder or bowel control. He has a pain regimen with pain clinic, and was not able to get in for evaluation.      Past Medical History:  Diagnosis Date  . Anemia   . Arthritis    "hips" (01/15/2015)  . Blood transfusion without reported diagnosis   . Chronic lower back pain   . Colon polyps   . Depression    denies  . Diabetes mellitus    diet controlled- no meds since wt loss  . Dysrhythmia    s/p surgery bariatric- cardioversion  . GERD (gastroesophageal reflux disease)   . History of gout   . Hyperlipidemia   . Hypertension   . Neuromuscular disorder ([place])   . PONV (postoperative nausea and vomiting)   . Sleep apnea    no longer have to use cpap since wt loss  . Stage 3 chronic kidney disease ([place]) 01/26/2018  . Stroke [facility]) 2006   denies residual on 01/15/2015    Patient Active Problem List   Diagnosis Date Noted  . Healthcare maintenance 06/09/2018  . Erectile dysfunction 01/26/2018  . Stage 3 chronic kidney disease ([place]) 01/26/2018  . Need for immunization against influenza 01/26/2018  . Prediabetes 03/29/2017  . Chronic back pain greater than 3 months duration 02/11/2017  . Narcotic dependence ([place]) 02/11/2017  . Benzodiazepine dependence ([place]) 02/11/2017  . Gastric bypass status for obesity 02/11/2017  . COLONIC POLYPS, ADENOMATOUS 08/03/2008  . HLD  (hyperlipidemia) 08/03/2008  . MORBID OBESITY 08/03/2008  . CVA 08/03/2008  . HIATAL HERNIA 08/03/2008  . SLEEP APNEA 08/03/2008  . ALLERGY 08/03/2008    Past Surgical History:  Procedure Laterality Date  . BARIATRIC SURGERY  2010   in [place]  . CARDIOVERSION    . COLONOSCOPY N/A 08/02/2014   Procedure: COLONOSCOPY;  Surgeon: Rogene Houston, MD;  [facility];  Service: Endoscopy;  Laterality: N/A;  210 - moved to 3/10 @ 11:55 - Ann notified pt  . ESOPHAGOGASTRODUODENOSCOPY    . HERNIA REPAIR    . LIPOSUCTION    . PANNICULECTOMY  01/14/2015  . PANNICULECTOMY N/A 01/14/2015   Procedure:  TOTAL PANNICULECTOMY WTH LIPOSUCTION OF SIDES;  Surgeon: Cristine Polio, MD;  Location: [place];  Service: Plastics;  Laterality: N/A;  . TONSILLECTOMY    . VENTRAL HERNIA REPAIR  01/14/2015  . VENTRAL HERNIA REPAIR N/A 01/14/2015   Procedure: HERNIA REPAIR VENTRAL ADULT AND MUSCLE REPAIR;  Surgeon: Cristine Polio, MD;  Location: [place];  Service: Plastics;  Laterality: N/A;       Home Medications    Prior to Admission medications   Medication Sig Start Date End Date Taking? Authorizing Provider  acetaminophen (TYLENOL) 500 MG tablet Take 1,000 mg by mouth every 6 (six) hours as needed for moderate pain.  [provider]  blood glucose meter kit and supplies KIT Test daily Dx  r73.03 06/01/17   Raylene Everts, MD  DULoxetine (CYMBALTA) 30 MG capsule Take 1 capsule (30 mg total) by mouth 2 (two) times daily. 01/26/18   Lorella Nimrod, MD  latanoprost (XALATAN) 0.005 % ophthalmic solution Place 1 drop into both eyes at bedtime.    [provider]  lisinopril (PRINIVIL,ZESTRIL) 5 MG tablet Take 1 tablet (5 mg total) by mouth daily. 06/09/18   Mosetta Anis, MD  Multiple Vitamins-Minerals (MULTIVITAMIN WITH MINERALS) tablet Take 1 tablet by mouth daily.    [provider]  Omega-3 Fatty Acids (OMEGA 3 PO) Take 1 capsule by mouth daily.    [provider]  Oxycodone HCl 20 MG TABS Take 1 tablet (20 mg total) by mouth 3 (three) times daily as needed. 01/26/18   Lorella Nimrod, MD  predniSONE (STERAPRED UNI-PAK 21 TAB) 10 MG (21) TBPK tablet Take by mouth daily. Take 6 tabs by mouth day 1, then 5 tabs, then 4 tabs, then 3 tabs, 2 tabs, then 1 tab for the last day 07/15/18   Tasia Catchings, Amy V, PA-C  rosuvastatin (CRESTOR) 10 MG tablet Take 1 tablet (10 mg total) by mouth daily. 01/26/18   Lorella Nimrod, MD  timolol (BETIMOL) 0.5 % ophthalmic solution Apply 1 drop to eye daily.    [provider]  tiZANidine (ZANAFLEX) 4 MG tablet Take 1 tablet (4 mg total) by mouth daily. 01/26/18 01/26/19  Lorella Nimrod, MD  UNABLE TO FIND 1 pair diabetic shoes 3 pairs of inserts Dx : r73.03 03/30/17   Raylene Everts, MD    Family History Family History  Problem Relation Age of Onset  . COPD Mother   . Depression Mother   . Hyperlipidemia Mother   . Hypertension Mother   . Heart disease Mother   . Diabetes Father   . Pulmonary embolism Father 61       blood clot  . Hypertension Brother     Social History Social History   Tobacco Use  . Smoking status: Never Smoker  . Smokeless tobacco: Never Used  Substance Use Topics  . Alcohol use: No  . Drug use: No     Allergies   Patient has no known allergies.   Review of Systems Review of Systems  Reason unable to perform ROS: See HPI as above.     Physical Exam Triage Vital Signs ED Triage Vitals  Enc Vitals Group     BP 07/15/18 1632 (!) 143/86     Pulse Rate 07/15/18 1632 97     Resp 07/15/18 1632 20     Temp 07/15/18 1632 97.7 F (36.5 C)     Temp Source 07/15/18 1632 Oral     SpO2 07/15/18 1632 100 %     Weight --      Height --      Head Circumference --      Peak Flow --      Pain Score 07/15/18 1634 9     Pain Loc --      Pain Edu? --      Excl. in [place]? --    No data found.  Updated Vital Signs BP (!) 143/86 (BP Location: Right Arm)   Pulse 97   Temp 97.7 F (36.5 C)  (Oral)   Resp 20   SpO2 100%   Visual Acuity Right Eye Distance:   Left Eye Distance:  Bilateral Distance:    Right Eye Near:   Left Eye Near:    Bilateral Near:     Physical Exam Constitutional:      General: He is not in acute distress.    Appearance: He is well-developed. He is not diaphoretic.  HENT:     Head: Normocephalic and atraumatic.  Eyes:     Conjunctiva/sclera: Conjunctivae normal.     Pupils: Pupils are equal, round, and reactive to light.  Cardiovascular:     Rate and Rhythm: Normal rate and regular rhythm.     Heart sounds: Normal heart sounds. No murmur. No friction rub. No gallop.   Pulmonary:     Effort: Pulmonary effort is normal. No accessory muscle usage or respiratory distress.     Breath sounds: Normal breath sounds. No stridor. No decreased breath sounds, wheezing, rhonchi or rales.  Musculoskeletal:     Comments: Difficult exam as patient unable to ambulate to exam table. Sitting in wheelchair. Able to stand up and walk a few steps on own, though states with pain.   Tenderness to palpation diffusely of the low back and bilateral hips. ROM observed with standing to sitting. Passive ROM of hip with flexion causes pain. Strength deferred. Sensation intact and equal bilaterally. Unable to assess straight leg raise.   Skin:    General: Skin is warm and dry.  Neurological:     Mental Status: He is alert and oriented to person, place, and time.      UC Treatments / Results  Labs (all labs ordered are listed, but only abnormal results are displayed) Labs Reviewed - No data to display  EKG None  Radiology No results found.  Procedures Procedures (including critical care time)  Medications Ordered in UC Medications  methylPREDNISolone sodium succinate (SOLU-MEDROL) 125 mg/2 mL injection 80 mg (80 mg Intramuscular Given 07/15/18 1800)    Initial Impression / Assessment and Plan / UC Course  I have reviewed the triage vital signs and the  nursing notes.  Pertinent labs & imaging results that were available during my care of the patient were reviewed by me and considered in my medical decision making (see chart for details).    Acute on chronic hip pain/low back pain, currently on pain regimen with pain clinic. Has gastric bypass with CKD stage III, told to avoid NSAIDs. Will provide prednisone for symptomatic treatment. Return precautions given. Otherwise, follow up with PCP/pain provider for further evaluation needed.  Patient states has not been evaluated by orthopedics for back/hip pain in the past, will provide orthopedic resources.   Final Clinical Impressions(s) / UC Diagnoses   Final diagnoses:  Bilateral hip pain    ED Prescriptions    Medication Sig Dispense Auth. Provider   predniSONE (STERAPRED UNI-PAK 21 TAB) 10 MG (21) TBPK tablet Take by mouth daily. Take 6 tabs by mouth day 1, then 5 tabs, then 4 tabs, then 3 tabs, 2 tabs, then 1 tab for the last day 21 tablet Tobin Chad, [place] 07/15/18 1804

## 2018-07-15 NOTE — Discharge Instructions (Signed)
Solumedrol injection in office today. Start prednisone as directed. Follow up with PCP/pain doctor/orthopedics for further evaluation. If experiencing numbness/tingling of the inner thighs, loss of bladder or bowel control, go to the emergency department for further evaluation needed.

## 2018-07-21 ENCOUNTER — Ambulatory Visit (HOSPITAL_COMMUNITY)
Admission: EM | Admit: 2018-07-21 | Discharge: 2018-07-21 | Disposition: A | Discharge status: Home / Self Care | Payer: Medicaid Other | Attending: Family Medicine | Admitting: Family Medicine

## 2018-07-21 ENCOUNTER — Encounter (HOSPITAL_COMMUNITY): Payer: Self-pay

## 2018-07-21 DIAGNOSIS — M160 Bilateral primary osteoarthritis of hip: Secondary | ICD-10-CM | POA: Diagnosis not present

## 2018-07-21 DIAGNOSIS — L03818 Cellulitis of other sites: Secondary | ICD-10-CM | POA: Diagnosis not present

## 2018-07-21 MED ORDER — CEPHALEXIN 500 MG PO CAPS: 500.0000 mg | ORAL_CAPSULE | Freq: Two times a day (BID) | ORAL | 0 refills | Status: DC

## 2018-07-21 NOTE — ED Provider Notes (Signed)
[place]    CSN: [id number] Arrival date & time: 07/21/18  [place]     History   Chief Complaint Chief Complaint  Patient presents with  . Insect Bite    right side upper shoulder area.   . Leg Pain    HPI [name] is a 48 y.o. male.   He is presenting with redness and pain in the right upper back.  Also having bilateral inguinal pain.  He noticed having redness a few days ago.  He has expressed some pus from the area.  Since that time is gotten significantly more painful and sore in the area.  Denies any puncture wound or any other inciting event.  He is unsure if he got bit by a bug.  Symptoms seem to be getting worse.  Denies any fevers.  Having acute on chronic worsening bilateral inguinal pain.  Has been prescribed prednisone.  He is going to pain clinic.  Symptoms are worse when he walks.  Review of the CT abdomen and pelvis from 2017 shows severe degenerative changes of bilateral hip joints.  HPI  Past Medical History:  Diagnosis Date  . Anemia   . Arthritis    "hips" (01/15/2015)  . Blood transfusion without reported diagnosis   . Chronic lower back pain   . Colon polyps   . Depression    denies  . Diabetes mellitus    diet controlled- no meds since wt loss  . Dysrhythmia    s/p surgery bariatric- cardioversion  . GERD (gastroesophageal reflux disease)   . History of gout   . Hyperlipidemia   . Hypertension   . Neuromuscular disorder ([place])   . PONV (postoperative nausea and vomiting)   . Sleep apnea    no longer have to use cpap since wt loss  . Stage 3 chronic kidney disease ([place]) 01/26/2018  . Stroke [facility]) 2006   denies residual on 01/15/2015    Patient Active Problem List   Diagnosis Date Noted  . Healthcare maintenance 06/09/2018  . Erectile dysfunction 01/26/2018  . Stage 3 chronic kidney disease ([place]) 01/26/2018  . Need for immunization against influenza 01/26/2018  . Prediabetes 03/29/2017  . Chronic back pain greater than 3  months duration 02/11/2017  . Narcotic dependence ([place]) 02/11/2017  . Benzodiazepine dependence ([place]) 02/11/2017  . Gastric bypass status for obesity 02/11/2017  . COLONIC POLYPS, ADENOMATOUS 08/03/2008  . HLD (hyperlipidemia) 08/03/2008  . MORBID OBESITY 08/03/2008  . CVA 08/03/2008  . HIATAL HERNIA 08/03/2008  . SLEEP APNEA 08/03/2008  . ALLERGY 08/03/2008    Past Surgical History:  Procedure Laterality Date  . BARIATRIC SURGERY  2010   in [place]  . CARDIOVERSION    . COLONOSCOPY N/A 08/02/2014   Procedure: COLONOSCOPY;  Surgeon: Rogene Houston, MD;  [facility];  Service: Endoscopy;  Laterality: N/A;  210 - moved to 3/10 @ 11:55 - Ann notified pt  . ESOPHAGOGASTRODUODENOSCOPY    . HERNIA REPAIR    . LIPOSUCTION    . PANNICULECTOMY  01/14/2015  . PANNICULECTOMY N/A 01/14/2015   Procedure:  TOTAL PANNICULECTOMY WTH LIPOSUCTION OF SIDES;  Surgeon: Cristine Polio, MD;  Location: [facility];  Service: Plastics;  Laterality: N/A;  . TONSILLECTOMY    . VENTRAL HERNIA REPAIR  01/14/2015  . VENTRAL HERNIA REPAIR N/A 01/14/2015   Procedure: HERNIA REPAIR VENTRAL ADULT AND MUSCLE REPAIR;  Surgeon: Cristine Polio, MD;  Location: [facility];  Service: Plastics;  Laterality: N/A;  Home Medications    Prior to Admission medications   Medication Sig Start Date End Date Taking? Authorizing Provider  acetaminophen (TYLENOL) 500 MG tablet Take 1,000 mg by mouth every 6 (six) hours as needed for moderate pain.    [provider]  blood glucose meter kit and supplies KIT Test daily Dx  r73.03 06/01/17   Raylene Everts, MD  cephALEXin (KEFLEX) 500 MG capsule Take 1 capsule (500 mg total) by mouth 2 (two) times daily. 07/21/18   Rosemarie Ax, MD  DULoxetine (CYMBALTA) 30 MG capsule Take 1 capsule (30 mg total) by mouth 2 (two) times daily. 01/26/18   Lorella Nimrod, MD  latanoprost (XALATAN) 0.005 % ophthalmic solution Place 1 drop into both eyes at bedtime.    [provider]  lisinopril (PRINIVIL,ZESTRIL) 5 MG tablet Take 1 tablet (5 mg total) by mouth daily. 06/09/18   Mosetta Anis, MD  Multiple Vitamins-Minerals (MULTIVITAMIN WITH MINERALS) tablet Take 1 tablet by mouth daily.    [provider]  Omega-3 Fatty Acids (OMEGA 3 PO) Take 1 capsule by mouth daily.    [provider]  Oxycodone HCl 20 MG TABS Take 1 tablet (20 mg total) by mouth 3 (three) times daily as needed. 01/26/18   Lorella Nimrod, MD  predniSONE (STERAPRED UNI-PAK 21 TAB) 10 MG (21) TBPK tablet Take by mouth daily. Take 6 tabs by mouth day 1, then 5 tabs, then 4 tabs, then 3 tabs, 2 tabs, then 1 tab for the last day 07/15/18   Tasia Catchings, Amy V, PA-C  rosuvastatin (CRESTOR) 10 MG tablet Take 1 tablet (10 mg total) by mouth daily. 01/26/18   Lorella Nimrod, MD  timolol (BETIMOL) 0.5 % ophthalmic solution Apply 1 drop to eye daily.    [provider]  tiZANidine (ZANAFLEX) 4 MG tablet Take 1 tablet (4 mg total) by mouth daily. 01/26/18 01/26/19  Lorella Nimrod, MD  UNABLE TO FIND 1 pair diabetic shoes 3 pairs of inserts Dx : r73.03 03/30/17   Raylene Everts, MD    Family History Family History  Problem Relation Age of Onset  . COPD Mother   . Depression Mother   . Hyperlipidemia Mother   . Hypertension Mother   . Heart disease Mother   . Diabetes Father   . Pulmonary embolism Father 21       blood clot  . Hypertension Brother     Social History Social History   Tobacco Use  . Smoking status: Never Smoker  . Smokeless tobacco: Never Used  Substance Use Topics  . Alcohol use: No  . Drug use: No     Allergies   Patient has no known allergies.   Review of Systems Review of Systems  Constitutional: Negative for fever.  HENT: Negative for congestion.   Respiratory: Negative for cough.   Cardiovascular: Negative for chest pain.  Gastrointestinal: Negative for abdominal pain.  Musculoskeletal: Positive for arthralgias, back pain and gait problem.    Skin: Positive for color change.  Neurological: Negative for weakness.  Hematological: Negative for adenopathy.  Psychiatric/Behavioral: Negative for agitation.     Physical Exam Triage Vital Signs ED Triage Vitals  Enc Vitals Group     BP 07/21/18 1931 (!) 140/52     Pulse Rate 07/21/18 1931 (!) 125     Resp 07/21/18 1931 18     Temp 07/21/18 1931 99.1 F (37.3 C)     Temp Source 07/21/18 1931 Oral  SpO2 07/21/18 1931 100 %     Weight --      Height --      Head Circumference --      Peak Flow --      Pain Score 07/21/18 1934 7     Pain Loc --      Pain Edu? --      Excl. in [place]? --    No data found.  Updated Vital Signs BP (!) 140/52 (BP Location: Left Arm)   Pulse (!) 125   Temp 99.1 F (37.3 C) (Oral)   Resp 18   SpO2 100%   Visual Acuity Right Eye Distance:   Left Eye Distance:   Bilateral Distance:    Right Eye Near:   Left Eye Near:    Bilateral Near:     Physical Exam Gen: NAD, alert, cooperative with exam, well-appearing ENT: normal lips, normal nasal mucosa,  Eye: normal EOM, normal conjunctiva and lids CV:  no edema, +2 pedal pulses   Resp: no accessory muscle use, non-labored,  Skin: No identifiable area of discharge, induration under the area of possible bite, redness overlying with no streaking Neuro: normal tone, normal sensation to touch Psych:  normal insight, alert and oriented MSK:  Left and right hip: Pain with internal rotation. Normal strength resistance to hip flexion. Ambulating with a limp. Neurovascular intact   UC Treatments / Results  Labs (all labs ordered are listed, but only abnormal results are displayed) Labs Reviewed - No data to display  EKG None  Radiology No results found.  Procedures Procedures (including critical care time)  Medications Ordered in UC Medications - No data to display  Initial Impression / Assessment and Plan / UC Course  I have reviewed the triage vital signs and the nursing  notes.  Pertinent labs & imaging results that were available during my care of the patient were reviewed by me and considered in my medical decision making (see chart for details).     [place] is a 48 year old male is presenting with likely cellulitis and OA of the hips.  Unclear of where the origin of the cellulitis came from.  Does not appear to be any abscess to drain.  Provided with Keflex and provide indications to follow-up.  Bilateral inguinal pain is likely result of degenerative changes.  Counseled that he may need to see orthopedist in order to consider hip arthroplasty.  Counseled supportive care.  Given occasions follow-up.  Final Clinical Impressions(s) / UC Diagnoses   Final diagnoses:  Cellulitis of other specified site  Primary osteoarthritis of both hips     Discharge Instructions     Please try the antibiotic  Please take tylenol for the pain  Please get a referral to an orthopedic doctor for possible hip replacement.     ED Prescriptions    Medication Sig Dispense Auth. Provider   cephALEXin (KEFLEX) 500 MG capsule Take 1 capsule (500 mg total) by mouth 2 (two) times daily. 14 capsule Rosemarie Ax, MD     Controlled Substance Prescriptions [place] Controlled Substance Registry consulted? Not Applicable   Rosemarie Ax, MD 07/21/18 2003

## 2018-07-21 NOTE — Discharge Instructions (Signed)
Please try the antibiotic  Please take tylenol for the pain  Please get a referral to an orthopedic doctor for possible hip replacement.

## 2018-07-21 NOTE — ED Triage Notes (Signed)
Pt present a insect bite on his right side upper should area. Pt states the insect bite is red and burning. Pt notices the insect bite a week ago. Pt also F/u on leg pain.

## 2018-07-25 ENCOUNTER — Encounter: Payer: Self-pay | Admitting: Internal Medicine

## 2018-07-25 ENCOUNTER — Ambulatory Visit: Payer: Medicaid Other | Admitting: Internal Medicine

## 2018-07-25 ENCOUNTER — Other Ambulatory Visit: Payer: Self-pay

## 2018-07-25 VITALS — BP 127/69 | HR 96 | Temp 98.3°F | Ht 68.0 in

## 2018-07-25 DIAGNOSIS — N183 Chronic kidney disease, stage 3 unspecified: Secondary | ICD-10-CM

## 2018-07-25 DIAGNOSIS — M25551 Pain in right hip: Secondary | ICD-10-CM

## 2018-07-25 DIAGNOSIS — L0390 Cellulitis, unspecified: Secondary | ICD-10-CM

## 2018-07-25 DIAGNOSIS — L03818 Cellulitis of other sites: Secondary | ICD-10-CM | POA: Diagnosis not present

## 2018-07-25 DIAGNOSIS — G8929 Other chronic pain: Secondary | ICD-10-CM

## 2018-07-25 DIAGNOSIS — Z792 Long term (current) use of antibiotics: Secondary | ICD-10-CM | POA: Diagnosis not present

## 2018-07-25 DIAGNOSIS — R739 Hyperglycemia, unspecified: Secondary | ICD-10-CM

## 2018-07-25 DIAGNOSIS — L03312 Cellulitis of back [any part except buttock]: Secondary | ICD-10-CM

## 2018-07-25 DIAGNOSIS — R7303 Prediabetes: Secondary | ICD-10-CM | POA: Diagnosis not present

## 2018-07-25 DIAGNOSIS — M25552 Pain in left hip: Secondary | ICD-10-CM | POA: Diagnosis not present

## 2018-07-25 DIAGNOSIS — M47816 Spondylosis without myelopathy or radiculopathy, lumbar region: Secondary | ICD-10-CM

## 2018-07-25 DIAGNOSIS — M5136 Other intervertebral disc degeneration, lumbar region: Secondary | ICD-10-CM | POA: Diagnosis not present

## 2018-07-25 DIAGNOSIS — R011 Cardiac murmur, unspecified: Secondary | ICD-10-CM | POA: Diagnosis not present

## 2018-07-25 DIAGNOSIS — Z79891 Long term (current) use of opiate analgesic: Secondary | ICD-10-CM

## 2018-07-25 DIAGNOSIS — M549 Dorsalgia, unspecified: Secondary | ICD-10-CM

## 2018-07-25 DIAGNOSIS — R200 Anesthesia of skin: Secondary | ICD-10-CM | POA: Diagnosis not present

## 2018-07-25 HISTORY — DX: Cellulitis, unspecified: L03.90

## 2018-07-25 LAB — CBC
HCT: 39.1 % (ref 39.0–52.0)
Hemoglobin: 12.6 g/dL — ABNORMAL LOW (ref 13.0–17.0)
MCH: 27.3 pg (ref 26.0–34.0)
MCHC: 32.2 g/dL (ref 30.0–36.0)
MCV: 84.8 fL (ref 80.0–100.0)
NRBC: 0 % (ref 0.0–0.2)
Platelets: 255 10*3/uL (ref 150–400)
RBC: 4.61 MIL/uL (ref 4.22–5.81)
RDW: 20.4 % — ABNORMAL HIGH (ref 11.5–15.5)
WBC: 21.8 10*3/uL — ABNORMAL HIGH (ref 4.0–10.5)

## 2018-07-25 LAB — BASIC METABOLIC PANEL
ANION GAP: 10 (ref 5–15)
BUN: 55 mg/dL — ABNORMAL HIGH (ref 6–20)
CO2: 21 mmol/L — ABNORMAL LOW (ref 22–32)
Calcium: 9.1 mg/dL (ref 8.9–10.3)
Chloride: 102 mmol/L (ref 98–111)
Creatinine, Ser: 2.56 mg/dL — ABNORMAL HIGH (ref 0.61–1.24)
GFR calc non Af Amer: 28 mL/min — ABNORMAL LOW (ref >60)
GFR, EST AFRICAN AMERICAN: 33 mL/min — AB (ref >60)
Glucose, Bld: 459 mg/dL — ABNORMAL HIGH (ref 70–99)
Potassium: 5 mmol/L (ref 3.5–5.1)
SODIUM: 133 mmol/L — AB (ref 135–145)

## 2018-07-25 MED ORDER — DOXYCYCLINE HYCLATE 100 MG PO TABS: 100.0000 mg | ORAL_TABLET | Freq: Two times a day (BID) | ORAL | 0 refills | Status: DC

## 2018-07-25 NOTE — Assessment & Plan Note (Addendum)
Patient reports that he has been having right upper back pain for about a week now, he had gone to the ED on 2/27 where he was having the redness and pain in the right upper back, unsure if this was from a bite. No labs were drawn at that time. He was diagnosed with cellulitis, no drainable abscess at that time, he was prescribed Keflex. He reports that he has been taking the Keflex but that the pain is still there and seems to be getting worse. He also notes some numbness in his bilateral 3-5 digits of his hands, strength and reflexes are normal. On exam he has a erythematous, tender area measuring about 9 cm x 5 cm in size, no areas of fluctuance was appreciated. Vitals are stable, afebrile. Bedside ultrasound showed no significant abscess that was drainable. This appears to be an uncomplicated cellulitis, keflex may not be sufficient for this infection given his poor response.  Plan: -Switch Keflex to Doxycycline for better MRSA coverage -Check CBC, BMP -RTC in about 1 week to evaluate -Advised patient that is symptoms don't improve or worsen to return to clinic  Addendum: Labs showed elevated leukocytosis to 21, will contact patient to inform him to continue antibiotics and inquire about how his symptoms are.

## 2018-07-25 NOTE — Patient Instructions (Signed)
Thank you for allowing [name] to provide your care today. Today we discussed your back infection, back pain, and generalized fatigue    I have ordered some labs for you. I will call if any are abnormal.    Today we made some changes to your medications.   Please stop taking the Keflex antibiotic, please start taking doxycycline twice a day for 7 days If your back pain persists or the infection worsens please come back to clinic to discuss further options  Please follow-up in 3 months, sooner if needed.    Should you have any questions or concerns please call the internal medicine clinic at [phone].

## 2018-07-25 NOTE — Progress Notes (Signed)
CC: Hip pain, back pain, prediabetes, cellulitis, and hand numbness  HPI:  [name] is a 48 y.o.  with a PMH listed below presenting for hip pain, back pain, prediabetes, cellulitis, and hand numbness.  Please see A&P for status of the patient's chronic medical conditions  Past Medical History:  Diagnosis Date  . Anemia   . Arthritis    "hips" (01/15/2015)  . Blood transfusion without reported diagnosis   . Chronic lower back pain   . Colon polyps   . Depression    denies  . Diabetes mellitus    diet controlled- no meds since wt loss  . Dysrhythmia    s/p surgery bariatric- cardioversion  . GERD (gastroesophageal reflux disease)   . History of gout   . Hyperlipidemia   . Hypertension   . Neuromuscular disorder ([place])   . PONV (postoperative nausea and vomiting)   . Sleep apnea    no longer have to use cpap since wt loss  . Stage 3 chronic kidney disease ([place]) 01/26/2018  . Stroke [facility]) 2006   denies residual on 01/15/2015   Review of Systems: Refer to history of present illness and assessment and plans for pertinent review of systems, all others reviewed and negative.  Physical Exam:  Vitals:   07/25/18 1546  BP: 127/69  Pulse: 96  Temp: 98.3 F (36.8 C)  TempSrc: Oral  SpO2: 100%  Height: 5\' 8"  (1.727 m)    Physical Exam  Constitutional: He is oriented to person, place, and time.  Fatigued appearing, NAD, sitting in wheelchair  HENT:  Head: Normocephalic and atraumatic.  Eyes: Pupils are equal, round, and reactive to light. Conjunctivae are normal.  Cardiovascular: Normal rate and regular rhythm.  Systolic murmur  Pulmonary/Chest: Effort normal and breath sounds normal. No respiratory distress.  Abdominal: Soft. Bowel sounds are normal. He exhibits no distension.  Musculoskeletal:        General: No edema.     Comments: ROM limited, see neuro exam  Neurological: He is alert and oriented to person, place, and time.  Sensation: Decreased sensation  in bilateral 3-5 digits Strength: 5/5 bilateral upper extremities, 3/5 BL LE limited due to pain  Skin:  Right upper back: Warm, erythematous, swollen area measuring about 9cm in length and 5 cm in width, no fluctuance  Psychiatric: Mood and affect normal.    Social History   Socioeconomic History  . Marital status: Single    Spouse name: Not on file  . Number of children: 0  . Years of education: 63  . Highest education level: Not on file  Occupational History  . Occupation: part time    Comment: [employer]  Social Needs  . Financial resource strain: Not on file  . Food insecurity:    Worry: Not on file    Inability: Not on file  . Transportation needs:    Medical: Not on file    Non-medical: Not on file  Tobacco Use  . Smoking status: Never Smoker  . Smokeless tobacco: Never Used  Substance and Sexual Activity  . Alcohol use: No  . Drug use: No  . Sexual activity: Yes  Lifestyle  . Physical activity:    Days per week: Not on file    Minutes per session: Not on file  . Stress: Not on file  Relationships  . Social connections:    Talks on phone: Not on file    Gets together: Not on file    Attends  religious service: Not on file    Active member of club or organization: Not on file    Attends meetings of clubs or organizations: Not on file    Relationship status: Not on file  . Intimate partner violence:    Fear of current or ex partner: Not on file    Emotionally abused: Not on file    Physically abused: Not on file    Forced sexual activity: Not on file  Other Topics Concern  . Not on file  Social History Narrative   Lives with [name] - age 6   Cares for her    Family History  Problem Relation Age of Onset  . COPD Mother   . Depression Mother   . Hyperlipidemia Mother   . Hypertension Mother   . Heart disease Mother   . Diabetes Father   . Pulmonary embolism Father 76       blood clot  . Hypertension Brother     Assessment & Plan:     See Encounters Tab for problem based charting.  Patient seen with Dr. Dareen Piano

## 2018-07-27 NOTE — Assessment & Plan Note (Signed)
A1c was 6.6 on 06/09/18. He reports that he has been having polyuria, polydipsia, and increased appetite. He has a right upper back cellulitis that may be contributing to his hyperglycemia. On exam he is fatigued appearing, vitals were stable. Will check BMP today to check his glucose.  Plan: -BMP  Addendum: Glucose elevated to >500, possibly related to his acute infection. Will repeat labs after acute infection resolves.

## 2018-07-27 NOTE — Addendum Note (Signed)
Addended by: Aldine Contes on: 07/27/2018 04:01 PM   Modules accepted: Level of Service

## 2018-07-27 NOTE — Progress Notes (Signed)
Internal Medicine Clinic Attending  I saw and evaluated the patient.  I personally confirmed the key portions of the history and exam documented by Dr. Krienke and I reviewed pertinent patient test results.  The assessment, diagnosis, and plan were formulated together and I agree with the documentation in the resident's note.    

## 2018-07-27 NOTE — Assessment & Plan Note (Signed)
Patient reports that he is still having chronic bilateral hip and back pain. He reports that he has pain with flexion of his hips and has recently started to walk with a cane for support. He goes to a pain management doctor, [name], and takes oxycodone 3 times a week. He reports that his pain has gotten worse and that he quit his job at [employer] about 1 week ago because of the pain. Lumbar x-ray on 03/29/15 showed moderate degenerative disc disease and spondylosis at L3-L4. He went to the ED on 2/21 and was given a course of steroids. He reports that it did not help. He has significant tenderness over his bilateral hips and 3/5 hip flexion limited due to pain. He was requesting a orthopedics consult.   Plan: -Continue pain management by [name] -Orthopedics referral

## 2018-07-28 ENCOUNTER — Telehealth: Payer: Self-pay | Admitting: *Deleted

## 2018-07-28 ENCOUNTER — Telehealth: Payer: Self-pay | Admitting: Internal Medicine

## 2018-07-28 NOTE — Telephone Encounter (Signed)
-----   Message from Asencion Noble, MD sent at 07/28/2018  8:29 AM EST ----- Good morning,  Patient needs a follow up appointment on Monday 3/9 to check on his cellulitis.  Thank you.

## 2018-07-28 NOTE — Telephone Encounter (Signed)
Contacted patient about lab results. He reports that he has been taking his doxycycline with no issues. He reports that his back pain and redness has improved. We discussed that we shouldsee him in the clinic to make sure that the area has improved and that he is feeling better and he reports that he can come in on Monday.

## 2018-07-28 NOTE — Telephone Encounter (Signed)
Called pt to schedule f/u visit for cellulitis per Dr Sherry Ruffing -  Call transferred to front office; appt scheduled 3/9 @ 1415 PM in [facility].

## 2018-07-29 DIAGNOSIS — M79652 Pain in left thigh: Secondary | ICD-10-CM | POA: Diagnosis not present

## 2018-07-29 DIAGNOSIS — M25559 Pain in unspecified hip: Secondary | ICD-10-CM | POA: Diagnosis not present

## 2018-07-29 DIAGNOSIS — Z79891 Long term (current) use of opiate analgesic: Secondary | ICD-10-CM | POA: Diagnosis not present

## 2018-07-29 DIAGNOSIS — G894 Chronic pain syndrome: Secondary | ICD-10-CM | POA: Diagnosis not present

## 2018-07-29 DIAGNOSIS — M545 Low back pain: Secondary | ICD-10-CM | POA: Diagnosis not present

## 2018-08-01 ENCOUNTER — Other Ambulatory Visit: Payer: Self-pay

## 2018-08-01 ENCOUNTER — Ambulatory Visit: Payer: Medicaid Other | Admitting: Internal Medicine

## 2018-08-01 VITALS — BP 115/63 | HR 76 | Temp 97.7°F | Ht 68.0 in

## 2018-08-01 DIAGNOSIS — R7303 Prediabetes: Secondary | ICD-10-CM

## 2018-08-01 DIAGNOSIS — N183 Chronic kidney disease, stage 3 unspecified: Secondary | ICD-10-CM

## 2018-08-01 DIAGNOSIS — L03818 Cellulitis of other sites: Secondary | ICD-10-CM | POA: Diagnosis not present

## 2018-08-01 DIAGNOSIS — L98492 Non-pressure chronic ulcer of skin of other sites with fat layer exposed: Secondary | ICD-10-CM | POA: Diagnosis not present

## 2018-08-01 HISTORY — DX: Non-pressure chronic ulcer of skin of other sites with fat layer exposed: L98.492

## 2018-08-01 LAB — GLUCOSE, CAPILLARY: Glucose-Capillary: 228 mg/dL — ABNORMAL HIGH (ref 70–99)

## 2018-08-01 NOTE — Assessment & Plan Note (Addendum)
He states previous pain in the area has improved as before he could not move his shoulder without superficial back pain in the area of cellulitis. He completed his 7 day course of doxycycline yesterday. He denies symptoms of fever, n/v, or chills. The area is mildly erythematous and warm but without purulence and measures about 20cmx5cm. He will be going to wound care for skin ulcers and advised him to have them confirm the cellulitis continues to resolve.   - return if there is pain, increased swelling - otherwise f/u two weeks  - CBC today  - f/u two weeks for wound check  ADDENDUM: WBC continues to be elevated at 21.3 with neutrophil predominance. Although symptoms have improved will treat with augmentin to cover for Strep cellulitis and will have him follow-up in two weeks with return precautions given. Plan discussed with patient.

## 2018-08-01 NOTE — Assessment & Plan Note (Signed)
Previous diabetic with hemoglobin a1c of 6.6 in January. Glucose on last visit was ~500, but this was possibly secondary to his cellulitis as he had a leukocytosis of 21. Glucose today is 228. He will need repeat a1c in a month and may need to start medication again at this time.

## 2018-08-01 NOTE — Assessment & Plan Note (Addendum)
Two bilateral skin ulcerations in the crease of his panus that started as small pimples and eventually grew. The right side is around 3x3cm with open wound, no purulent drainage or erythema or tenderness and left is 2x2cm with eschar. He has tried to treat the area with antibiotic ointment but they continue to increase in size. They appear to be along the crease of previous skin fold removal from when he lost weight. The area does not appear infected at this time, and he just completed 7 days of doxy for cellulitis. 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   - advised to keep area clean and dry, avoid baths, try to avoid skin on skin contact and to not wear tight fitting clothing  - wound care consult placed  - CBC - f/u two weeks for wound check

## 2018-08-01 NOTE — Progress Notes (Addendum)
   CC: abdominal pressure ulcer, back cellulitis  HPI:  [name] is a 48 y.o. with PMH as below presenting for follow-up for cellulitis of the upper right back. He additionally noticed ulcerations on his abdomen bilaterally that have not been healing with antibacterial ointment.   Please see A&P for assessment of the patient's acute and chronic medical conditions.    Past Medical History:  Diagnosis Date  . Anemia   . Arthritis    "hips" (01/15/2015)  . Blood transfusion without reported diagnosis   . Chronic lower back pain   . Colon polyps   . Depression    denies  . Diabetes mellitus    diet controlled- no meds since wt loss  . Dysrhythmia    s/p surgery bariatric- cardioversion  . GERD (gastroesophageal reflux disease)   . History of gout   . Hyperlipidemia   . Hypertension   . Neuromuscular disorder ([place])   . PONV (postoperative nausea and vomiting)   . Sleep apnea    no longer have to use cpap since wt loss  . Stage 3 chronic kidney disease ([place]) 01/26/2018  . Stroke [facility]) 2006   denies residual on 01/15/2015   Review of Systems:   Review of Systems  Constitutional: Negative for chills and fever.  Respiratory: Negative for cough, shortness of breath and wheezing.   Cardiovascular: Positive for leg swelling. Negative for chest pain.  Gastrointestinal: Negative for diarrhea, nausea and vomiting.  Genitourinary: Negative for dysuria, flank pain, frequency and urgency.  Musculoskeletal: Positive for joint pain. Negative for back pain.  Skin: Negative for itching and rash.       Decreased pain in right upper back compared to previous visit.  Skin ulcers on abdomen   Physical Exam:  Constitution: NAD, obese  Eyes: no icterus or injection  Cardio: RRR, no m/r/g  Respiratory: CTA, no w/r/r  Abdominal: soft, non-distended MSK: +1 pitting edema LE b/l, bilateral hip pain with minimal movement Skin: redness and erythema without TTP or purulent drainage in area  of 19cmx5cm, left 2x2cm ulceration with eschar crease of panus, right 3x3cm stage II ulceration right crease panus         Vitals:   08/01/18 1515  BP: 115/63  Pulse: 76  Temp: 97.7 F (36.5 C)  TempSrc: Oral  SpO2: 100%  Height: 5\' 8"  (1.727 m)    Assessment & Plan:   See Encounters Tab for problem based charting.  Patient seen with Dr. Eppie Gibson

## 2018-08-01 NOTE — Patient Instructions (Signed)
Thank you for allowing [name] to provide your care today. Today we discussed your skin ulcerations and upper back cellulitis    I have ordered the following labs for you:  Complete metabolic panel and complete blood count   I will call if any are abnormal.    I have sent in a referral for wound care for you abdominal skin ulcers. Please follow-up with these appointments.   For your ULCERS: please keep the area clean and dry. Please do not take baths while this area is healing but you may shower. Place clean and dry dressings to the area and allow it to breathe by laying on your back when possible. Please avoid skin to skin rubbing together.   Please follow-up in two weeks for wound check.    Should you have any questions or concerns please call the internal medicine clinic at [phone].

## 2018-08-02 ENCOUNTER — Encounter (INDEPENDENT_AMBULATORY_CARE_PROVIDER_SITE_OTHER): Payer: Self-pay | Admitting: Orthopaedic Surgery

## 2018-08-02 ENCOUNTER — Ambulatory Visit (INDEPENDENT_AMBULATORY_CARE_PROVIDER_SITE_OTHER): Payer: Medicaid Other | Admitting: Orthopaedic Surgery

## 2018-08-02 ENCOUNTER — Ambulatory Visit (INDEPENDENT_AMBULATORY_CARE_PROVIDER_SITE_OTHER): Payer: Self-pay

## 2018-08-02 ENCOUNTER — Other Ambulatory Visit: Payer: Self-pay | Admitting: Internal Medicine

## 2018-08-02 ENCOUNTER — Ambulatory Visit (INDEPENDENT_AMBULATORY_CARE_PROVIDER_SITE_OTHER): Payer: Medicaid Other

## 2018-08-02 DIAGNOSIS — L03818 Cellulitis of other sites: Secondary | ICD-10-CM

## 2018-08-02 DIAGNOSIS — M25552 Pain in left hip: Secondary | ICD-10-CM | POA: Diagnosis not present

## 2018-08-02 DIAGNOSIS — Z6841 Body Mass Index (BMI) 40.0 and over, adult: Secondary | ICD-10-CM | POA: Diagnosis not present

## 2018-08-02 DIAGNOSIS — M160 Bilateral primary osteoarthritis of hip: Secondary | ICD-10-CM | POA: Diagnosis not present

## 2018-08-02 DIAGNOSIS — M545 Low back pain, unspecified: Secondary | ICD-10-CM

## 2018-08-02 DIAGNOSIS — M25551 Pain in right hip: Secondary | ICD-10-CM | POA: Diagnosis not present

## 2018-08-02 DIAGNOSIS — G8929 Other chronic pain: Secondary | ICD-10-CM | POA: Diagnosis not present

## 2018-08-02 LAB — CBC WITH DIFFERENTIAL/PLATELET
Basophils Absolute: 0.1 10*3/uL (ref 0.0–0.2)
Basos: 0 %
EOS (ABSOLUTE): 0.2 10*3/uL (ref 0.0–0.4)
Eos: 1 %
Hematocrit: 39 % (ref 37.5–51.0)
Hemoglobin: 12.8 g/dL — ABNORMAL LOW (ref 13.0–17.7)
Immature Grans (Abs): 0.2 10*3/uL — ABNORMAL HIGH (ref 0.0–0.1)
Immature Granulocytes: 1 %
Lymphocytes Absolute: 2.3 10*3/uL (ref 0.7–3.1)
Lymphs: 11 %
MCH: 28 pg (ref 26.6–33.0)
MCHC: 32.8 g/dL (ref 31.5–35.7)
MCV: 85 fL (ref 79–97)
MONOS ABS: 1 10*3/uL — AB (ref 0.1–0.9)
Monocytes: 5 %
Neutrophils Absolute: 17.5 10*3/uL — ABNORMAL HIGH (ref 1.4–7.0)
Neutrophils: 82 %
Platelets: 451 10*3/uL — ABNORMAL HIGH (ref 150–450)
RBC: 4.57 x10E6/uL (ref 4.14–5.80)
RDW: 17.6 % — AB (ref 11.6–15.4)
WBC: 21.3 10*3/uL (ref 3.4–10.8)

## 2018-08-02 LAB — CMP14 + ANION GAP
ALT: 30 IU/L (ref 0–44)
AST: 21 IU/L (ref 0–40)
Albumin/Globulin Ratio: 0.7 — ABNORMAL LOW (ref 1.2–2.2)
Albumin: 2.9 g/dL — ABNORMAL LOW (ref 4.0–5.0)
Alkaline Phosphatase: 226 IU/L — ABNORMAL HIGH (ref 39–117)
Anion Gap: 16 mmol/L (ref 10.0–18.0)
BUN/Creatinine Ratio: 21 — ABNORMAL HIGH (ref 9–20)
BUN: 30 mg/dL — ABNORMAL HIGH (ref 6–24)
Bilirubin Total: 1 mg/dL (ref 0.0–1.2)
CO2: 22 mmol/L (ref 20–29)
Calcium: 9.9 mg/dL (ref 8.7–10.2)
Chloride: 97 mmol/L (ref 96–106)
Creatinine, Ser: 1.41 mg/dL — ABNORMAL HIGH (ref 0.76–1.27)
GFR calc Af Amer: 68 mL/min/{1.73_m2} (ref >59)
GFR calc non Af Amer: 58 mL/min/{1.73_m2} — ABNORMAL LOW (ref >59)
Globulin, Total: 4.3 g/dL (ref 1.5–4.5)
Glucose: 241 mg/dL — ABNORMAL HIGH (ref 65–99)
Potassium: 5.5 mmol/L — ABNORMAL HIGH (ref 3.5–5.2)
Sodium: 135 mmol/L (ref 134–144)
Total Protein: 7.2 g/dL (ref 6.0–8.5)

## 2018-08-02 MED ORDER — AMOXICILLIN-POT CLAVULANATE 875-125 MG PO TABS: 1.0000 | ORAL_TABLET | Freq: Two times a day (BID) | ORAL | 0 refills | Status: AC

## 2018-08-02 NOTE — Progress Notes (Signed)
I saw and evaluated the patient. I personally confirmed the key portions of Dr. Aurelio Jew history and exam and reviewed pertinent patient test results. The assessment, diagnosis, and plan were formulated together and I agree with the documentation in the resident's note.  On examination, his back cellulitis was still erythematous and warm to the touch.  There were no obvious overlying breaks in the skin and he had a large lipoma below the cellulitic area.  With the continued elevated WBC count, lack of obvious abscess and lack of complete resolution of the cellulitis on doxycycline we will broaden our antibiotic coverage to Augmentin and reassess clinical examination and WBC upon completion of course.  After examining the patient's lesions in the groin, I am more concerned they represent traction lesions complicated by some surface trauma from the overlying panus and clothing rather than this being an unusual pyoderma gangrenosum lesion, although this must be kept in the differential if the WBC count remains elevated (hematologic malignancy).

## 2018-08-02 NOTE — Progress Notes (Signed)
Office Visit Note   Patient: [name]           Date of Birth: 24-May-1971           MRN: [id number] Visit Date: 08/02/2018              Requested by: Lucious Groves, DO [address]  [place], [place] [address] PCP: Asencion Noble, MD   Assessment & Plan: Visit Diagnoses:  1. Primary osteoarthritis of hips, bilateral   2. Chronic low back pain, unspecified back pain laterality, unspecified whether sciatica present   3. Body mass index 40.0-44.9, adult ([place])   4. Morbid obesity ([place])     Plan: Impression is advanced bilateral hip degenerative joint disease with superior medial wear of the acetabulum.  We discussed that he will need hip replacements for long-term relief but he is currently not a surgical candidate due to his increased BMI and his chronic wounds in his groin which are being managed by the wound care center.  His low back pain management will be deferred to his chronic pain clinic.  Once the patient has achieved a BMI of less than 40 and healed his groin wounds we can proceed with surgery.  All questions answered to his satisfaction today.  Follow-Up Instructions: Return if symptoms worsen or fail to improve.   Orders:  Orders Placed This Encounter  Procedures  . XR HIPS BILAT W OR W/O PELVIS 3-4 VIEWS   No orders of the defined types were placed in this encounter.     Procedures: No procedures performed   Clinical Data: No additional findings.   Subjective: Chief Complaint  Patient presents with  . Right Hip - Pain  . Left Hip - Pain    [name] is a pleasant 48 year old gentleman comes in with severe bilateral hip pain and groin pain for many years.  He was previously over 600 pounds but was able to lose a significant amount of weight from gastric bypass surgery.  His BMI is currently 42.6.  He used to work at [employer] but due to the severe hip pain he has not been able to walk without a walker and thus has not been able to return  back to work.  He takes oxycodone 20 mg daily for chronic back pain.  He does go to a pain clinic.  He has had a panniculectomy in the past.  In terms of his hip he has taken over-the-counter NSAIDs without any relief and now cannot take any due to history of gastric bypass surgery.  The oxycodone takes the edge off his hip pain but does not fully relieve it.   Review of Systems  Constitutional: Negative.   All other systems reviewed and are negative.    Objective: Vital Signs: There were no vitals taken for this visit.  Physical Exam Vitals signs and nursing note reviewed.  Constitutional:      Appearance: He is well-developed.  HENT:     Head: Normocephalic and atraumatic.  Eyes:     Pupils: Pupils are equal, round, and reactive to light.  Neck:     Musculoskeletal: Neck supple.  Pulmonary:     Effort: Pulmonary effort is normal.  Abdominal:     Palpations: Abdomen is soft.  Musculoskeletal: Normal range of motion.  Skin:    General: Skin is warm.  Neurological:     Mental Status: He is alert and oriented to person, place, and time.  Psychiatric:  Behavior: Behavior normal.        Thought Content: Thought content normal.        Judgment: Judgment normal.     Ortho Exam Bilateral hip exam shows very limited range of motion with severe pain.  He does have 2 chronic ulcers near the groin region with stable eschars.  No signs of infection or drainage. Specialty Comments:  No specialty comments available.  Imaging: Xr Hips Bilat W Or W/o Pelvis 3-4 Views  Result Date: 08/02/2018 Advanced bilateral hip degenerative joint disease.  Superior medial wear of the acetabulum    PMFS History: Patient Active Problem List   Diagnosis Date Noted  . 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 ([place]) 08/01/2018  . Cellulitis 07/25/2018  . Healthcare maintenance 06/09/2018  . Erectile dysfunction 01/26/2018  . Stage 3 chronic kidney disease ([place]) 01/26/2018  . Need for  immunization against influenza 01/26/2018  . Prediabetes 03/29/2017  . Chronic back pain greater than 3 months duration 02/11/2017  . Narcotic dependence ([place]) 02/11/2017  . Benzodiazepine dependence ([place]) 02/11/2017  . Gastric bypass status for obesity 02/11/2017  . COLONIC POLYPS, ADENOMATOUS 08/03/2008  . HLD (hyperlipidemia) 08/03/2008  . MORBID OBESITY 08/03/2008  . CVA 08/03/2008  . HIATAL HERNIA 08/03/2008  . SLEEP APNEA 08/03/2008  . ALLERGY 08/03/2008   Past Medical History:  Diagnosis Date  . Anemia   . Arthritis    "hips" (01/15/2015)  . Blood transfusion without reported diagnosis   . Chronic lower back pain   . Colon polyps   . Depression    denies  . Diabetes mellitus    diet controlled- no meds since wt loss  . Dysrhythmia    s/p surgery bariatric- cardioversion  . GERD (gastroesophageal reflux disease)   . History of gout   . Hyperlipidemia   . Hypertension   . Neuromuscular disorder ([place])   . PONV (postoperative nausea and vomiting)   . Sleep apnea    no longer have to use cpap since wt loss  . Stage 3 chronic kidney disease ([place]) 01/26/2018  . Stroke [facility]) 2006   denies residual on 01/15/2015    Family History  Problem Relation Age of Onset  . COPD Mother   . Depression Mother   . Hyperlipidemia Mother   . Hypertension Mother   . Heart disease Mother   . Diabetes Father   . Pulmonary embolism Father 76       blood clot  . Hypertension Brother     Past Surgical History:  Procedure Laterality Date  . BARIATRIC SURGERY  2010   in [place]  . CARDIOVERSION    . COLONOSCOPY N/A 08/02/2014   Procedure: COLONOSCOPY;  Surgeon: Rogene Houston, MD;  Location: [facility];  Service: Endoscopy;  Laterality: N/A;  210 - moved to 3/10 @ 11:55 - Ann notified pt  . ESOPHAGOGASTRODUODENOSCOPY    . HERNIA REPAIR    . LIPOSUCTION    . PANNICULECTOMY  01/14/2015  . PANNICULECTOMY N/A 01/14/2015   Procedure:  TOTAL PANNICULECTOMY WTH LIPOSUCTION OF SIDES;   Surgeon: Cristine Polio, MD;  Location: [facility];  Service: Plastics;  Laterality: N/A;  . TONSILLECTOMY    . VENTRAL HERNIA REPAIR  01/14/2015  . VENTRAL HERNIA REPAIR N/A 01/14/2015   Procedure: HERNIA REPAIR VENTRAL ADULT AND MUSCLE REPAIR;  Surgeon: Cristine Polio, MD;  Location: [facility];  Service: Plastics;  Laterality: N/A;   Social History   Occupational History  . Occupation: part  time    Comment: [employer]  Tobacco Use  . Smoking status: Never Smoker  . Smokeless tobacco: Never Used  Substance and Sexual Activity  . Alcohol use: No  . Drug use: No  . Sexual activity: Yes

## 2018-08-09 ENCOUNTER — Encounter (HOSPITAL_BASED_OUTPATIENT_CLINIC_OR_DEPARTMENT_OTHER): Payer: Medicaid Other | Attending: Internal Medicine

## 2018-08-09 ENCOUNTER — Other Ambulatory Visit: Payer: Self-pay

## 2018-08-09 DIAGNOSIS — L97122 Non-pressure chronic ulcer of left thigh with fat layer exposed: Secondary | ICD-10-CM | POA: Insufficient documentation

## 2018-08-09 DIAGNOSIS — I129 Hypertensive chronic kidney disease with stage 1 through stage 4 chronic kidney disease, or unspecified chronic kidney disease: Secondary | ICD-10-CM | POA: Diagnosis not present

## 2018-08-09 DIAGNOSIS — E1122 Type 2 diabetes mellitus with diabetic chronic kidney disease: Secondary | ICD-10-CM | POA: Insufficient documentation

## 2018-08-09 DIAGNOSIS — G4730 Sleep apnea, unspecified: Secondary | ICD-10-CM | POA: Insufficient documentation

## 2018-08-09 DIAGNOSIS — L97112 Non-pressure chronic ulcer of right thigh with fat layer exposed: Secondary | ICD-10-CM | POA: Insufficient documentation

## 2018-08-09 DIAGNOSIS — Z9884 Bariatric surgery status: Secondary | ICD-10-CM | POA: Diagnosis not present

## 2018-08-09 DIAGNOSIS — N183 Chronic kidney disease, stage 3 (moderate): Secondary | ICD-10-CM | POA: Diagnosis not present

## 2018-08-09 DIAGNOSIS — Z6841 Body Mass Index (BMI) 40.0 and over, adult: Secondary | ICD-10-CM | POA: Diagnosis not present

## 2018-08-15 ENCOUNTER — Encounter (HOSPITAL_BASED_OUTPATIENT_CLINIC_OR_DEPARTMENT_OTHER): Payer: Medicaid Other

## 2018-08-18 DIAGNOSIS — Z6841 Body Mass Index (BMI) 40.0 and over, adult: Secondary | ICD-10-CM | POA: Diagnosis not present

## 2018-08-18 DIAGNOSIS — L97112 Non-pressure chronic ulcer of right thigh with fat layer exposed: Secondary | ICD-10-CM | POA: Diagnosis not present

## 2018-08-18 DIAGNOSIS — N183 Chronic kidney disease, stage 3 (moderate): Secondary | ICD-10-CM | POA: Diagnosis not present

## 2018-08-18 DIAGNOSIS — L97122 Non-pressure chronic ulcer of left thigh with fat layer exposed: Secondary | ICD-10-CM | POA: Diagnosis not present

## 2018-08-18 DIAGNOSIS — E1122 Type 2 diabetes mellitus with diabetic chronic kidney disease: Secondary | ICD-10-CM | POA: Diagnosis not present

## 2018-08-18 DIAGNOSIS — L98492 Non-pressure chronic ulcer of skin of other sites with fat layer exposed: Secondary | ICD-10-CM | POA: Diagnosis not present

## 2018-08-18 DIAGNOSIS — I129 Hypertensive chronic kidney disease with stage 1 through stage 4 chronic kidney disease, or unspecified chronic kidney disease: Secondary | ICD-10-CM | POA: Diagnosis not present

## 2018-08-18 DIAGNOSIS — Z9884 Bariatric surgery status: Secondary | ICD-10-CM | POA: Diagnosis not present

## 2018-08-18 DIAGNOSIS — G4730 Sleep apnea, unspecified: Secondary | ICD-10-CM | POA: Diagnosis not present

## 2018-08-19 ENCOUNTER — Other Ambulatory Visit: Payer: Self-pay | Admitting: *Deleted

## 2018-08-19 ENCOUNTER — Other Ambulatory Visit: Payer: Self-pay | Admitting: Internal Medicine

## 2018-08-19 DIAGNOSIS — L03818 Cellulitis of other sites: Secondary | ICD-10-CM

## 2018-08-19 DIAGNOSIS — E8809 Other disorders of plasma-protein metabolism, not elsewhere classified: Secondary | ICD-10-CM

## 2018-08-26 DIAGNOSIS — L03818 Cellulitis of other sites: Secondary | ICD-10-CM | POA: Diagnosis not present

## 2018-08-26 DIAGNOSIS — E8809 Other disorders of plasma-protein metabolism, not elsewhere classified: Secondary | ICD-10-CM | POA: Diagnosis not present

## 2018-08-29 LAB — MICROSCOPIC EXAMINATION
Bacteria, UA: NONE SEEN
RBC, Urine: NONE SEEN /hpf (ref 0–2)

## 2018-08-29 LAB — IMMUNOFIXATION ELECTROPHORESIS
IgA/Immunoglobulin A, Serum: 336 mg/dL (ref 90–386)
IgG (Immunoglobin G), Serum: 1699 mg/dL — ABNORMAL HIGH (ref 603–1613)
IgM (Immunoglobulin M), Srm: 99 mg/dL (ref 20–172)

## 2018-08-29 LAB — PE AND FLC, SERUM
A/G Ratio: 0.9 (ref 0.7–1.7)
ALPHA 1: 0.2 g/dL (ref 0.0–0.4)
ALPHA 2: 0.9 g/dL (ref 0.4–1.0)
Albumin ELP: 3.1 g/dL (ref 2.9–4.4)
Beta: 1 g/dL (ref 0.7–1.3)
Gamma Globulin: 1.5 g/dL (ref 0.4–1.8)
Globulin, Total: 3.6 g/dL (ref 2.2–3.9)
Ig Kappa Free Light Chain: 127.2 mg/L — ABNORMAL HIGH (ref 3.3–19.4)
Ig Lambda Free Light Chain: 68.8 mg/L — ABNORMAL HIGH (ref 5.7–26.3)
Kappa/Lambda FluidC Ratio: 1.85 — ABNORMAL HIGH (ref 0.26–1.65)
Total Protein: 6.7 g/dL (ref 6.0–8.5)

## 2018-08-29 LAB — BMP8+ANION GAP
Anion Gap: 17 mmol/L (ref 10.0–18.0)
BUN/Creatinine Ratio: 8 — ABNORMAL LOW (ref 9–20)
BUN: 14 mg/dL (ref 6–24)
CO2: 23 mmol/L (ref 20–29)
Calcium: 9.9 mg/dL (ref 8.7–10.2)
Chloride: 98 mmol/L (ref 96–106)
Creatinine, Ser: 1.73 mg/dL — ABNORMAL HIGH (ref 0.76–1.27)
GFR calc Af Amer: 53 mL/min/{1.73_m2} — ABNORMAL LOW (ref >59)
GFR, EST NON AFRICAN AMERICAN: 46 mL/min/{1.73_m2} — AB (ref >59)
Glucose: 174 mg/dL — ABNORMAL HIGH (ref 65–99)
Potassium: 5.1 mmol/L (ref 3.5–5.2)
Sodium: 138 mmol/L (ref 134–144)

## 2018-08-29 LAB — CBC WITH DIFFERENTIAL/PLATELET
Basophils Absolute: 0.1 10*3/uL (ref 0.0–0.2)
Basos: 1 %
EOS (ABSOLUTE): 0.2 10*3/uL (ref 0.0–0.4)
Eos: 3 %
HEMOGLOBIN: 13.6 g/dL (ref 13.0–17.7)
Hematocrit: 38.9 % (ref 37.5–51.0)
Immature Grans (Abs): 0 10*3/uL (ref 0.0–0.1)
Immature Granulocytes: 0 %
Lymphocytes Absolute: 2.2 10*3/uL (ref 0.7–3.1)
Lymphs: 26 %
MCH: 29.7 pg (ref 26.6–33.0)
MCHC: 35 g/dL (ref 31.5–35.7)
MCV: 85 fL (ref 79–97)
MONOCYTES: 7 %
Monocytes Absolute: 0.6 10*3/uL (ref 0.1–0.9)
Neutrophils Absolute: 5.3 10*3/uL (ref 1.4–7.0)
Neutrophils: 63 %
Platelets: 262 10*3/uL (ref 150–450)
RBC: 4.58 x10E6/uL (ref 4.14–5.80)
RDW: 17.8 % — ABNORMAL HIGH (ref 11.6–15.4)
WBC: 8.4 10*3/uL (ref 3.4–10.8)

## 2018-08-29 LAB — URINALYSIS, COMPLETE
Bilirubin, UA: NEGATIVE
Glucose, UA: NEGATIVE
Ketones, UA: NEGATIVE
Leukocytes,UA: NEGATIVE
Nitrite, UA: NEGATIVE
RBC, UA: NEGATIVE
Specific Gravity, UA: 1.027 (ref 1.005–1.030)
Urobilinogen, Ur: 0.2 mg/dL (ref 0.2–1.0)
pH, UA: 5.5 (ref 5.0–7.5)

## 2018-08-29 LAB — KAPPA/LAMBDA LIGHT CHAINS, FREE, WITH RATIO, 24HR. URINE
FR KAPPA LT CH,24HR: 138.75 mg/24 hr
FR LAMBDA LT CH,24HR: 6.49 mg/24 hr
Free Kappa Lt Chains,Ur: 126.14 mg/L — ABNORMAL HIGH (ref 0.63–113.79)
Free Lambda Lt Chains,Ur: 5.9 mg/L (ref 0.47–11.77)
Kappa/Lambda Ratio,U: 21.38 (ref 1.03–31.76)

## 2018-09-01 ENCOUNTER — Encounter (HOSPITAL_BASED_OUTPATIENT_CLINIC_OR_DEPARTMENT_OTHER): Payer: Medicaid Other | Attending: Internal Medicine

## 2018-09-26 DIAGNOSIS — Z79891 Long term (current) use of opiate analgesic: Secondary | ICD-10-CM | POA: Diagnosis not present

## 2018-09-26 DIAGNOSIS — M25559 Pain in unspecified hip: Secondary | ICD-10-CM | POA: Diagnosis not present

## 2018-09-26 DIAGNOSIS — M79652 Pain in left thigh: Secondary | ICD-10-CM | POA: Diagnosis not present

## 2018-09-26 DIAGNOSIS — M545 Low back pain: Secondary | ICD-10-CM | POA: Diagnosis not present

## 2018-09-26 DIAGNOSIS — G894 Chronic pain syndrome: Secondary | ICD-10-CM | POA: Diagnosis not present

## 2018-11-08 ENCOUNTER — Other Ambulatory Visit: Payer: Self-pay | Admitting: *Deleted

## 2018-11-08 ENCOUNTER — Telehealth: Payer: Self-pay | Admitting: *Deleted

## 2018-11-08 NOTE — Telephone Encounter (Signed)
PATIENT CALLED REQUESTING ORTHOPEDIC REFERRAL FOR 2ND OPINION WITH MURPHY WAINER. WILL SEND NOTE TO HIS DR. Sherry Ruffing.

## 2018-11-17 ENCOUNTER — Other Ambulatory Visit: Payer: Self-pay

## 2018-11-17 ENCOUNTER — Ambulatory Visit (INDEPENDENT_AMBULATORY_CARE_PROVIDER_SITE_OTHER): Payer: Medicaid Other | Admitting: Family Medicine

## 2018-11-17 ENCOUNTER — Encounter (INDEPENDENT_AMBULATORY_CARE_PROVIDER_SITE_OTHER): Payer: Self-pay

## 2018-11-17 ENCOUNTER — Encounter: Payer: Self-pay | Admitting: Family Medicine

## 2018-11-17 DIAGNOSIS — N183 Chronic kidney disease, stage 3 unspecified: Secondary | ICD-10-CM

## 2018-11-17 DIAGNOSIS — R7303 Prediabetes: Secondary | ICD-10-CM | POA: Diagnosis not present

## 2018-11-17 DIAGNOSIS — E785 Hyperlipidemia, unspecified: Secondary | ICD-10-CM | POA: Diagnosis not present

## 2018-11-17 DIAGNOSIS — Z9884 Bariatric surgery status: Secondary | ICD-10-CM

## 2018-11-17 DIAGNOSIS — Z1159 Encounter for screening for other viral diseases: Secondary | ICD-10-CM | POA: Diagnosis not present

## 2018-11-17 DIAGNOSIS — M160 Bilateral primary osteoarthritis of hip: Secondary | ICD-10-CM | POA: Diagnosis not present

## 2018-11-17 DIAGNOSIS — M25552 Pain in left hip: Secondary | ICD-10-CM

## 2018-11-17 DIAGNOSIS — M25551 Pain in right hip: Secondary | ICD-10-CM | POA: Diagnosis not present

## 2018-11-17 NOTE — Progress Notes (Signed)
Subjective:     Patient ID: [name], male   DOB: 09/21/1970, 48 y.o.   MRN: [id number]  [name] presents for New Patient (Initial Visit) (establish care)  Start is a 48 year old male patient who was previously seen at [facility] internal medicine.  Reports to [facility] to establish a primary care provider here so that is closer to his home.  He reports taking all of his medications that are currently on his list as directed.  Does not have any trouble or difficulty with any of them.  Does report that he has not had a refill on his lisinopril from his kidney doctor.  In discussion he reports this is probably because he has not been seen by her since early last year in 2019.  Has a significant history of hiatal hernia, CVA, stage III kidney disease, hyperlipidemia, morbid obesity, narcotic and benzo dependency, gastric bypass, pannus removal, prediabetic among others.  He denies having any issues or concerns to discuss today outside of making sure he has a referral for his hip pain.  Hip pain: Recent x-rays demonstrate bilateral degenerative joint disease with acetabulum involvement.  Questionable need for hip replacements by [facility].  Wants to be seen by [facility]on Anna for second opinion.  Referral for this was already placed on January 16.  He has not heard from their office at this time yet.  Is walking with a cane today he says that it does help.  Reports that he had to leave his job earlier this year at [employer] secondary to the discomfort and pain.  Reports that he is able to workout on the elliptical at home but needs to do this more often as he has put on weight since quitting at work.  Health maintenance: Does not appear to have had a pneumonia vaccine and or recent tetanus.  Though he is not 100% sure on this at this time.  We will be reviewing his information to assess this need.  Does not need any refills on any of his medications.  Does not have a PSA in the  system either.  Denies having any fever, chills, cough, shortness of breath, chest pain, palpitations, leg swelling, headaches, vision changes or dizziness.  Does not have any other signs or symptoms of COVID or infection either.  Reports that he lives alone did used to live with grandmother who he took care of.  Reports that he does have a cat he calls Mr. Perrin Smack to rescue.  He enjoys watching racing all kinds of races nothing in particular.  Enjoys eating out watching movies listening to music and occasional reading.  Reports that he does snack a lot tries to avoid red meat eats a lot of chicken and [place] his downfall he reports are carbohydrates breads chips things of that nature and some sweets.  Additionally he drinks 1 monster can drink a day for caffeine.  The rest of the time he just drinks water which equates to 3-4 bottles of 16 ounces.  He reports wearing a seatbelt and sunscreen as needed.  Reports having smoke and carbon monoxide detectors as well.  Reports not using his phone when he is driving.  Does not smoke does not drink has not reported any use of illegal substances.  Reports that he is sexually active but uses protection.   Past Medical, Surgical, Social History, Allergies, and Medications have been Reviewed.  Past Medical History:  Diagnosis Date  . Anemia   .  Arthritis    "hips" (01/15/2015)  . Blood transfusion without reported diagnosis   . Chronic lower back pain   . Colon polyps   . Depression    denies  . Diabetes mellitus    diet controlled- no meds since wt loss  . Dysrhythmia    s/p surgery bariatric- cardioversion  . GERD (gastroesophageal reflux disease)   . History of gout   . Hyperlipidemia   . Hypertension   . Neuromuscular disorder ([place])   . PONV (postoperative nausea and vomiting)   . Sleep apnea    no longer have to use cpap since wt loss  . Stage 3 chronic kidney disease ([place]) 01/26/2018  . Stroke [facility]) 2006   denies residual on 01/15/2015   Past  Surgical History:  Procedure Laterality Date  . BARIATRIC SURGERY  2010   in [place]  . CARDIOVERSION    . COLONOSCOPY N/A 08/02/2014   Procedure: COLONOSCOPY;  Surgeon: Rogene Houston, MD;  Location: [facility];  Service: Endoscopy;  Laterality: N/A;  210 - moved to 3/10 @ 11:55 - Ann notified pt  . ESOPHAGOGASTRODUODENOSCOPY    . HERNIA REPAIR    . LIPOSUCTION    . PANNICULECTOMY  01/14/2015  . PANNICULECTOMY N/A 01/14/2015   Procedure:  TOTAL PANNICULECTOMY WTH LIPOSUCTION OF SIDES;  Surgeon: Cristine Polio, MD;  Location: [place];  Service: Plastics;  Laterality: N/A;  . TONSILLECTOMY    . VENTRAL HERNIA REPAIR  01/14/2015  . VENTRAL HERNIA REPAIR N/A 01/14/2015   Procedure: HERNIA REPAIR VENTRAL ADULT AND MUSCLE REPAIR;  Surgeon: Cristine Polio, MD;  Location: [place];  Service: Plastics;  Laterality: N/A;   Social History   Socioeconomic History  . Marital status: Single    Spouse name: Not on file  . Number of children: 0  . Years of education: 84  . Highest education level: Not on file  Occupational History  . Occupation: part time    Comment: [employer]  Social Needs  . Financial resource strain: Not on file  . Food insecurity    Worry: Not on file    Inability: Not on file  . Transportation needs    Medical: Not on file    Non-medical: Not on file  Tobacco Use  . Smoking status: Never Smoker  . Smokeless tobacco: Never Used  Substance and Sexual Activity  . Alcohol use: No  . Drug use: No  . Sexual activity: Yes  Lifestyle  . Physical activity    Days per week: Not on file    Minutes per session: Not on file  . Stress: Not on file  Relationships  . Social Herbalist on phone: Not on file    Gets together: Not on file    Attends religious service: Not on file    Active member of club or organization: Not on file    Attends meetings of clubs or organizations: Not on file    Relationship status: Not on file  . Intimate partner violence    Fear of  current or ex partner: Not on file    Emotionally abused: Not on file    Physically abused: Not on file    Forced sexual activity: Not on file  Other Topics Concern  . Not on file  Social History Narrative   Lives with Catalina Lunger - age 42   Cares for her    Outpatient Encounter Medications as of 11/17/2018  Medication Sig  .  acetaminophen (TYLENOL) 500 MG tablet Take 1,000 mg by mouth every 6 (six) hours as needed for moderate pain.  . blood glucose meter kit and supplies KIT Test daily Dx  r73.03  . DULoxetine (CYMBALTA) 30 MG capsule Take 1 capsule (30 mg total) by mouth 2 (two) times daily.  Marland Kitchen latanoprost (XALATAN) 0.005 % ophthalmic solution Place 1 drop into both eyes at bedtime.  . Multiple Vitamins-Minerals (MULTIVITAMIN WITH MINERALS) tablet Take 1 tablet by mouth daily.  . Oxycodone HCl 20 MG TABS Take 1 tablet (20 mg total) by mouth 3 (three) times daily as needed.  . rosuvastatin (CRESTOR) 10 MG tablet Take 1 tablet (10 mg total) by mouth daily.  . timolol (BETIMOL) 0.5 % ophthalmic solution Apply 1 drop to eye daily.  Marland Kitchen UNABLE TO FIND 1 pair diabetic shoes 3 pairs of inserts Dx : r73.03  . [DISCONTINUED] cephALEXin (KEFLEX) 500 MG capsule Take 1 capsule (500 mg total) by mouth 2 (two) times daily. (Patient not taking: Reported on 11/17/2018)  . [DISCONTINUED] doxycycline (VIBRA-TABS) 100 MG tablet Take 1 tablet (100 mg total) by mouth 2 (two) times daily. (Patient not taking: Reported on 11/17/2018)  . [DISCONTINUED] lisinopril (PRINIVIL,ZESTRIL) 5 MG tablet Take 1 tablet (5 mg total) by mouth daily. (Patient not taking: Reported on 11/17/2018)  . [DISCONTINUED] Omega-3 Fatty Acids (OMEGA 3 PO) Take 1 capsule by mouth daily.  . [DISCONTINUED] predniSONE (STERAPRED UNI-PAK 21 TAB) 10 MG (21) TBPK tablet Take by mouth daily. Take 6 tabs by mouth day 1, then 5 tabs, then 4 tabs, then 3 tabs, 2 tabs, then 1 tab for the last day (Patient not taking: Reported on  11/17/2018)  . [DISCONTINUED] tiZANidine (ZANAFLEX) 4 MG tablet Take 1 tablet (4 mg total) by mouth daily. (Patient not taking: Reported on 11/17/2018)   No facility-administered encounter medications on file as of 11/17/2018.    No Known Allergies  Review of Systems  Constitutional: Negative for activity change, appetite change, chills and fever.  HENT: Negative.   Eyes: Negative for visual disturbance.  Respiratory: Negative for cough and shortness of breath.   Cardiovascular: Negative for chest pain, palpitations and leg swelling.  Gastrointestinal: Negative.   Endocrine: Negative for polydipsia, polyphagia and polyuria.  Genitourinary: Negative.   Musculoskeletal: Positive for arthralgias, joint swelling and myalgias.  Skin: Negative.   Neurological: Negative for dizziness and headaches.  Hematological: Negative.   Psychiatric/Behavioral: Negative for confusion and sleep disturbance. The patient is not nervous/anxious.   All other systems reviewed and are negative.      Objective:     BP 114/80   Pulse 100   Temp 98.1 F (36.7 C) (Oral)   Resp 12   Ht '5\' 8"'  (1.727 m)   Wt 292 lb (132.5 kg)   SpO2 97%   BMI 44.40 kg/m   Physical Exam Vitals signs and nursing note reviewed.  Constitutional:      Appearance: Normal appearance. He is obese.  HENT:     Head: Normocephalic and atraumatic.     Right Ear: External ear normal.     Left Ear: External ear normal.     Nose: Nose normal.  Eyes:     General:        Right eye: No discharge.        Left eye: No discharge.     Conjunctiva/sclera: Conjunctivae normal.     Comments: Wears glasses   Neck:     Musculoskeletal: Normal range of motion and  neck supple.  Cardiovascular:     Rate and Rhythm: Normal rate and regular rhythm.     Pulses: Normal pulses.     Heart sounds: Normal heart sounds.  Pulmonary:     Effort: Pulmonary effort is normal.     Breath sounds: Normal breath sounds.  Musculoskeletal: Normal range of  motion.  Skin:    General: Skin is warm and dry.     Capillary Refill: Capillary refill takes more than 3 seconds.  Neurological:     Mental Status: He is alert and oriented to person, place, and time.  Psychiatric:        Mood and Affect: Mood normal.        Behavior: Behavior normal.        Thought Content: Thought content normal.        Judgment: Judgment normal.        Assessment and Plan       1. MORBID OBESITY Deteriorated, reports that he has put on weight over the last year or so.  Reports that he is put on weight greatly over the last couple months secondary to not working anymore because of his hip pain.  Has had a bypass procedure as well as pannus removal in the past.  Encouraged to work on his elliptical that he has at home and walk as he can tolerate.  Additionally encouraged to make sure that he remove snack food items or places him with good snack options such as apples and carrots for chips and sweets.  And encouraged to increase water intake. - COMPLETE METABOLIC PANEL WITH GFR - Hemoglobin A1c - Lipid panel - TSH  2. Hyperlipidemia, unspecified hyperlipidemia type Currently he is taking low-dose Crestor.  I do not have any recent lab work to review at this time.  We will be looking to get labs at next visit.  He reports that he would like to try to lose some weight prior to getting the labs drawn.  - Lipid panel  3. Stage 3 chronic kidney disease ([place]) GFR from April was 5, month before that was 78.  Collectively he is ranging in stage III.  Reports that his kidney doctor would not refill his medication that starts with an L that is supposed to be kidney protective.  He does not demonstrate elevated blood pressure at this time.  In looking back at his chart it seems that he was on low-dose lisinopril.  He is not taking this in a while.  Would like him to follow-up back with Dr. Lowanda Foster to make sure that this is something that is still recommended.  Provided with the  kidney office number.  - COMPLETE METABOLIC PANEL WITH GFR  4. Prediabetes Last A1c was 6.6.  Reports that he was trying to manage this with diet control.  Would like to have 3 months before he has a drawl again.  To see if he can get his self back into a better weight and control.  - Hemoglobin A1c  5. Hip pain, bilateral Deteriorated, has bilateral hip pain.  Had x-rays that demonstrated degenerative joint disease bilaterally.  Wanted to have a secondary opinion was at [place] now wants to go to [facility].  Referral was placed by internal medicine already on June 16.  Advised that if he does not hear from the office he can call them and if he needs [name] to we will make the referral form.  6. Osteoarthritis of both hips, unspecified osteoarthritis type  Walks with a cane and is having a hard time doing any form of exercise or work at this time.  Quit his job a couple months back at [employer] secondary to this.  X-ray did demonstrate acetabulum changes and degenerative joint disease bilaterally.  He knows that he needs to lose some weight to help with this as well.  Is seeking second opinion to see if he needs to have bilateral hip replacements.  - VITAMIN D 25 Hydroxy (Vit-D Deficiency, Fractures) 7. Gastric bypass status for obesity  Not currently on either of these. Both recommended post bariatric sx.   - VITAMIN D 25 Hydroxy (Vit-D Deficiency, Fractures) - Vitamin B12  8. Encounter for hepatitis C screening test for low risk patient Has intercourse, at times reports not using protection.   - HEP C AB W/REFL  Patient acknowledged agreement and understanding of the plan regarding all of the above information.  Follow Up: 5 months   Perlie Mayo, DNP, AGNP-BC [address], [address], [place] [address] Office Hours: Mon-Thurs 8 am-5 pm; Fri 8 am-12 pm Office Phone:  [phone]  Office Fax: [phone]

## 2018-11-17 NOTE — Patient Instructions (Addendum)
Thank you for coming into the office today. I appreciate the opportunity to provide you with the care for your health and wellness. Today we discussed: over all health   FOLLOW UP: 5 months, can call if you need [name] sooner  Labs 1 week prior to next appt.   Please call [facility] [phone] Dr Lowanda Foster (make an appt)  Please get back in with eye doctor and dentist before end of year.  Referral is already in from Internal Medicine to Dr Laureen Abrahams office for hip 2nd opinion.  You can call them to make sure they got it. If having trouble let us know we will re submit for you.  Over next 5 months:  Please use elliptical machine 30-40 minutes daily. Please do safe hip exercises.  Work to replace chips and snack items with healthier versions: apples, carrots; and the alike. Work to increase water intake.  Please continue to practice social distancing .   [place] YOUR HANDS WELL AND FREQUENTLY. AVOID TOUCHING YOUR FACE, UNLESS YOUR HANDS ARE FRESHLY WASHED.   GET FRESH AIR DAILY. STAY HYDRATED WITH WATER.   It was a pleasure to see you and I look forward to continuing to work together on your health and well-being. Please do not hesitate to call the office if you need care or have questions about your care.  Have a wonderful day and week.  With Gratitude,  Cherly Beach, DNP, AGNP-BC   Calorie Counting for Weight Loss Calories are units of energy. Your body needs a certain amount of calories from food to keep you going throughout the day. When you eat more calories than your body needs, your body stores the extra calories as fat. When you eat fewer calories than your body needs, your body burns fat to get the energy it needs. Calorie counting means keeping track of how many calories you eat and drink each day. Calorie counting can be helpful if you need to lose weight. If you make sure to eat fewer calories than your body needs, you should lose weight.  Ask your health care provider what a healthy weight is for you. For calorie counting to work, you will need to eat the right number of calories in a day in order to lose a healthy amount of weight per week. A dietitian can help you determine how many calories you need in a day and will give you suggestions on how to reach your calorie goal.  A healthy amount of weight to lose per week is usually 1-2 lb (0.5-0.9 kg). This usually means that your daily calorie intake should be reduced by 500-750 calories.  Eating 1,200 - 1,500 calories per day can help most women lose weight.  Eating 1,500 - 1,800 calories per day can help most men lose weight. What is my plan? My goal is to have __________ calories per day. If I have this many calories per day, I should lose around __________ pounds per week. What do I need to know about calorie counting? In order to meet your daily calorie goal, you will need to:  Find out how many calories are in each food you would like to eat. Try to do this before you eat.  Decide how much of the food you plan to eat.  Write down what you ate and how many calories it had. Doing this is called keeping a food log. To successfully lose weight, it is important to balance calorie counting with a  healthy lifestyle that includes regular activity. Aim for 150 minutes of moderate exercise (such as walking) or 75 minutes of vigorous exercise (such as running) each week. Where do I find calorie information?  The number of calories in a food can be found on a Nutrition Facts label. If a food does not have a Nutrition Facts label, try to look up the calories online or ask your dietitian for help. Remember that calories are listed per serving. If you choose to have more than one serving of a food, you will have to multiply the calories per serving by the amount of servings you plan to eat. For example, the label on a package of bread might say that a serving size is 1 slice and that  there are 90 calories in a serving. If you eat 1 slice, you will have eaten 90 calories. If you eat 2 slices, you will have eaten 180 calories. How do I keep a food log? Immediately after each meal, record the following information in your food log:  What you ate. Don't forget to include toppings, sauces, and other extras on the food.  How much you ate. This can be measured in cups, ounces, or number of items.  How many calories each food and drink had.  The total number of calories in the meal. Keep your food log near you, such as in a small notebook in your pocket, or use a mobile app or website. Some programs will calculate calories for you and show you how many calories you have left for the day to meet your goal. What are some calorie counting tips?   Use your calories on foods and drinks that will fill you up and not leave you hungry: ? Some examples of foods that fill you up are nuts and nut butters, vegetables, lean proteins, and high-fiber foods like whole grains. High-fiber foods are foods with more than 5 g fiber per serving. ? Drinks such as sodas, specialty coffee drinks, alcohol, and juices have a lot of calories, yet do not fill you up.  Eat nutritious foods and avoid empty calories. Empty calories are calories you get from foods or beverages that do not have many vitamins or protein, such as candy, sweets, and soda. It is better to have a nutritious high-calorie food (such as an avocado) than a food with few nutrients (such as a bag of chips).  Know how many calories are in the foods you eat most often. This will help you calculate calorie counts faster.  Pay attention to calories in drinks. Low-calorie drinks include water and unsweetened drinks.  Pay attention to nutrition labels for "low fat" or "fat free" foods. These foods sometimes have the same amount of calories or more calories than the full fat versions. They also often have added sugar, starch, or salt, to make up  for flavor that was removed with the fat.  Find a way of tracking calories that works for you. Get creative. Try different apps or programs if writing down calories does not work for you. What are some portion control tips?  Know how many calories are in a serving. This will help you know how many servings of a certain food you can have.  Use a measuring cup to measure serving sizes. You could also try weighing out portions on a kitchen scale. With time, you will be able to estimate serving sizes for some foods.  Take some time to put servings of different foods on your  favorite plates, bowls, and cups so you know what a serving looks like.  Try not to eat straight from a bag or box. Doing this can lead to overeating. Put the amount you would like to eat in a cup or on a plate to make sure you are eating the right portion.  Use smaller plates, glasses, and bowls to prevent overeating.  Try not to multitask (for example, watch TV or use your computer) while eating. If it is time to eat, sit down at a table and enjoy your food. This will help you to know when you are full. It will also help you to be aware of what you are eating and how much you are eating. What are tips for following this plan? Reading food labels  Check the calorie count compared to the serving size. The serving size may be smaller than what you are used to eating.  Check the source of the calories. Make sure the food you are eating is high in vitamins and protein and low in saturated and trans fats. Shopping  Read nutrition labels while you shop. This will help you make healthy decisions before you decide to purchase your food.  Make a grocery list and stick to it. Cooking  Try to cook your favorite foods in a healthier way. For example, try baking instead of frying.  Use low-fat dairy products. Meal planning  Use more fruits and vegetables. Half of your plate should be fruits and vegetables.  Include lean  proteins like poultry and fish. How do I count calories when eating out?  Ask for smaller portion sizes.  Consider sharing an entree and sides instead of getting your own entree.  If you get your own entree, eat only half. Ask for a box at the beginning of your meal and put the rest of your entree in it so you are not tempted to eat it.  If calories are listed on the menu, choose the lower calorie options.  Choose dishes that include vegetables, fruits, whole grains, low-fat dairy products, and lean protein.  Choose items that are boiled, broiled, grilled, or steamed. Stay away from items that are buttered, battered, fried, or served with cream sauce. Items labeled "crispy" are usually fried, unless stated otherwise.  Choose water, low-fat milk, unsweetened iced tea, or other drinks without added sugar. If you want an alcoholic beverage, choose a lower calorie option such as a glass of wine or light beer.  Ask for dressings, sauces, and syrups on the side. These are usually high in calories, so you should limit the amount you eat.  If you want a salad, choose a garden salad and ask for grilled meats. Avoid extra toppings like bacon, cheese, or fried items. Ask for the dressing on the side, or ask for olive oil and vinegar or lemon to use as dressing.  Estimate how many servings of a food you are given. For example, a serving of cooked rice is  cup or about the size of half a baseball. Knowing serving sizes will help you be aware of how much food you are eating at restaurants. The list below tells you how big or small some common portion sizes are based on everyday objects: ? 1 oz-4 stacked dice. ? 3 oz-1 deck of cards. ? 1 tsp-1 die. ? 1 Tbsp- a ping-pong ball. ? 2 Tbsp-1 ping-pong ball. ?  cup- baseball. ? 1 cup-1 baseball. Summary  Calorie counting means keeping track of how many calories  you eat and drink each day. If you eat fewer calories than your body needs, you should lose  weight.  A healthy amount of weight to lose per week is usually 1-2 lb (0.5-0.9 kg). This usually means reducing your daily calorie intake by 500-750 calories.  The number of calories in a food can be found on a Nutrition Facts label. If a food does not have a Nutrition Facts label, try to look up the calories online or ask your dietitian for help.  Use your calories on foods and drinks that will fill you up, and not on foods and drinks that will leave you hungry.  Use smaller plates, glasses, and bowls to prevent overeating. This information is not intended to replace advice given to you by your health care provider. Make sure you discuss any questions you have with your health care provider. Document Released: 05/11/2005 Document Revised: 01/28/2018 Document Reviewed: 04/10/2016 Elsevier Interactive Patient Education  2019 [employer].

## 2018-11-21 DIAGNOSIS — M545 Low back pain: Secondary | ICD-10-CM | POA: Diagnosis not present

## 2018-11-21 DIAGNOSIS — Z79891 Long term (current) use of opiate analgesic: Secondary | ICD-10-CM | POA: Diagnosis not present

## 2018-11-21 DIAGNOSIS — M79652 Pain in left thigh: Secondary | ICD-10-CM | POA: Diagnosis not present

## 2018-11-21 DIAGNOSIS — M25559 Pain in unspecified hip: Secondary | ICD-10-CM | POA: Diagnosis not present

## 2018-11-21 DIAGNOSIS — G894 Chronic pain syndrome: Secondary | ICD-10-CM | POA: Diagnosis not present

## 2018-11-22 ENCOUNTER — Ambulatory Visit: Payer: Medicaid Other | Admitting: Family Medicine

## 2018-11-23 DIAGNOSIS — M25552 Pain in left hip: Secondary | ICD-10-CM | POA: Diagnosis not present

## 2018-11-23 DIAGNOSIS — M25551 Pain in right hip: Secondary | ICD-10-CM | POA: Diagnosis not present

## 2019-01-18 ENCOUNTER — Telehealth: Payer: Self-pay | Admitting: Family Medicine

## 2019-01-18 DIAGNOSIS — M79652 Pain in left thigh: Secondary | ICD-10-CM | POA: Diagnosis not present

## 2019-01-18 DIAGNOSIS — M545 Low back pain: Secondary | ICD-10-CM | POA: Diagnosis not present

## 2019-01-18 DIAGNOSIS — Z79891 Long term (current) use of opiate analgesic: Secondary | ICD-10-CM | POA: Diagnosis not present

## 2019-01-18 DIAGNOSIS — G894 Chronic pain syndrome: Secondary | ICD-10-CM | POA: Diagnosis not present

## 2019-01-18 NOTE — Telephone Encounter (Signed)
Please call the pt to discuss weight loss options

## 2019-01-18 NOTE — Telephone Encounter (Signed)
Patient will continue to see the weight management provider he is already seeing.

## 2019-02-01 ENCOUNTER — Ambulatory Visit (INDEPENDENT_AMBULATORY_CARE_PROVIDER_SITE_OTHER): Payer: Medicaid Other | Admitting: Family Medicine

## 2019-02-07 DIAGNOSIS — H25813 Combined forms of age-related cataract, bilateral: Secondary | ICD-10-CM | POA: Diagnosis not present

## 2019-02-07 DIAGNOSIS — H40013 Open angle with borderline findings, low risk, bilateral: Secondary | ICD-10-CM | POA: Diagnosis not present

## 2019-02-07 DIAGNOSIS — E1139 Type 2 diabetes mellitus with other diabetic ophthalmic complication: Secondary | ICD-10-CM | POA: Diagnosis not present

## 2019-02-15 ENCOUNTER — Ambulatory Visit (INDEPENDENT_AMBULATORY_CARE_PROVIDER_SITE_OTHER): Payer: Medicaid Other | Admitting: Family Medicine

## 2019-02-15 ENCOUNTER — Other Ambulatory Visit: Payer: Self-pay

## 2019-02-15 ENCOUNTER — Encounter (INDEPENDENT_AMBULATORY_CARE_PROVIDER_SITE_OTHER): Payer: Self-pay | Admitting: Family Medicine

## 2019-02-15 VITALS — BP 152/84 | HR 62 | Temp 98.3°F | Ht 67.0 in | Wt 282.0 lb

## 2019-02-15 DIAGNOSIS — Z0289 Encounter for other administrative examinations: Secondary | ICD-10-CM

## 2019-02-15 DIAGNOSIS — R5383 Other fatigue: Secondary | ICD-10-CM | POA: Diagnosis not present

## 2019-02-15 DIAGNOSIS — Z9189 Other specified personal risk factors, not elsewhere classified: Secondary | ICD-10-CM | POA: Diagnosis not present

## 2019-02-15 DIAGNOSIS — R0602 Shortness of breath: Secondary | ICD-10-CM

## 2019-02-15 DIAGNOSIS — E1165 Type 2 diabetes mellitus with hyperglycemia: Secondary | ICD-10-CM | POA: Diagnosis not present

## 2019-02-15 DIAGNOSIS — E559 Vitamin D deficiency, unspecified: Secondary | ICD-10-CM

## 2019-02-15 DIAGNOSIS — Z6841 Body Mass Index (BMI) 40.0 and over, adult: Secondary | ICD-10-CM | POA: Diagnosis not present

## 2019-02-15 DIAGNOSIS — Z1331 Encounter for screening for depression: Secondary | ICD-10-CM | POA: Diagnosis not present

## 2019-02-15 DIAGNOSIS — E66813 Obesity, class 3: Secondary | ICD-10-CM

## 2019-02-16 LAB — HEMOGLOBIN A1C
Est. average glucose Bld gHb Est-mCnc: 137 mg/dL
Hgb A1c MFr Bld: 6.4 % — ABNORMAL HIGH (ref 4.8–5.6)

## 2019-02-16 LAB — COMPREHENSIVE METABOLIC PANEL
ALT: 24 IU/L (ref 0–44)
AST: 25 IU/L (ref 0–40)
Albumin/Globulin Ratio: 1.2 (ref 1.2–2.2)
Albumin: 3.8 g/dL — ABNORMAL LOW (ref 4.0–5.0)
Alkaline Phosphatase: 124 IU/L — ABNORMAL HIGH (ref 39–117)
BUN/Creatinine Ratio: 13 (ref 9–20)
BUN: 19 mg/dL (ref 6–24)
Bilirubin Total: 0.8 mg/dL (ref 0.0–1.2)
CO2: 23 mmol/L (ref 20–29)
Calcium: 9.5 mg/dL (ref 8.7–10.2)
Chloride: 99 mmol/L (ref 96–106)
Creatinine, Ser: 1.42 mg/dL — ABNORMAL HIGH (ref 0.76–1.27)
GFR calc Af Amer: 67 mL/min/{1.73_m2} (ref >59)
GFR calc non Af Amer: 58 mL/min/{1.73_m2} — ABNORMAL LOW (ref >59)
Globulin, Total: 3.2 g/dL (ref 1.5–4.5)
Glucose: 136 mg/dL — ABNORMAL HIGH (ref 65–99)
Potassium: 4.5 mmol/L (ref 3.5–5.2)
Sodium: 139 mmol/L (ref 134–144)
Total Protein: 7 g/dL (ref 6.0–8.5)

## 2019-02-16 LAB — INSULIN, RANDOM: INSULIN: 17.7 u[IU]/mL (ref 2.6–24.9)

## 2019-02-16 LAB — MICROALBUMIN / CREATININE URINE RATIO
Creatinine, Urine: 225.1 mg/dL
Microalb/Creat Ratio: 262 mg/g creat — ABNORMAL HIGH (ref 0–29)
Microalbumin, Urine: 589.2 ug/mL

## 2019-02-16 LAB — LIPID PANEL WITH LDL/HDL RATIO
Cholesterol, Total: 211 mg/dL — ABNORMAL HIGH (ref 100–199)
HDL: 37 mg/dL — ABNORMAL LOW (ref >39)
LDL Chol Calc (NIH): 134 mg/dL — ABNORMAL HIGH (ref 0–99)
LDL/HDL Ratio: 3.6 ratio (ref 0.0–3.6)
Triglycerides: 220 mg/dL — ABNORMAL HIGH (ref 0–149)
VLDL Cholesterol Cal: 40 mg/dL (ref 5–40)

## 2019-02-16 LAB — T4, FREE: Free T4: 1.11 ng/dL (ref 0.82–1.77)

## 2019-02-16 LAB — TSH: TSH: 2.2 u[IU]/mL (ref 0.450–4.500)

## 2019-02-16 LAB — T3: T3, Total: 86 ng/dL (ref 71–180)

## 2019-02-16 LAB — VITAMIN D 25 HYDROXY (VIT D DEFICIENCY, FRACTURES): Vit D, 25-Hydroxy: 29.2 ng/mL — ABNORMAL LOW (ref 30.0–100.0)

## 2019-02-17 ENCOUNTER — Other Ambulatory Visit (HOSPITAL_COMMUNITY): Payer: Medicaid Other

## 2019-02-21 ENCOUNTER — Other Ambulatory Visit: Payer: Self-pay

## 2019-02-21 ENCOUNTER — Ambulatory Visit (HOSPITAL_COMMUNITY): Payer: Medicaid Other | Attending: Cardiology

## 2019-02-21 DIAGNOSIS — R0602 Shortness of breath: Secondary | ICD-10-CM | POA: Diagnosis not present

## 2019-02-21 NOTE — Progress Notes (Signed)
Office: [phone]  /  Fax: [phone]   Dear Dr. Edmonia Lynch,   Thank you for referring [name] to our clinic. The following note includes my evaluation and treatment recommendations.  HPI:   Chief Complaint: OBESITY    [name] has been referred by Dr. Edmonia Lynch for consultation regarding his obesity and obesity related comorbidities.    [name] (MR# [id number]) is a 48 y.o. male who presents on 02/21/2019 for obesity evaluation and treatment. Current BMI is Body mass index is 44.17 kg/m.[name] has been struggling with his weight for many years and has been unsuccessful in either losing weight, maintaining weight loss, or reaching his healthy weight goal.     [name] attended our information session and states he is currently in the action stage of change and ready to dedicate time achieving and maintaining a healthier weight. [name] is interested in becoming our patient and working on intensive lifestyle modifications including (but not limited to) diet, exercise and weight loss.     [name] is referred by Dr. Percell Miller. He has a history of gastric bypass in 2010 (lost approximately 210 lbs and has regained approximately 45 lbs). He drinks 1 bottle of water in the a.m. and has no food. At lunch he eats half an egg sandwich with 2 eggs, mayo, cheese or [place] and cheese on bread with mayo. For dinner he eats 1 piece of chicken, pork loin, or hamburger with a baked potato.    Shaft states his desired weight loss is to weigh 260 or less he has been heavy most of his life he started gaining weight in his teens his heaviest weight ever was 595 lbs. he snacks frequently in the evenings he skips breakfast frequently he frequently makes poor food choices he struggles with emotional eating    Fatigue [name] feels his energy is lower than it should be. This has worsened with weight gain and has worsened recently. [name] denies daytime somnolence and  admits to waking up still  tired. Patient is at risk for obstructive sleep apnea. Patient generally gets 6 hours of sleep per night, and states they generally do not sleep well most nights. Snoring is present. Apneic episodes are not present. Epworth Sleepiness Score is 7. 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  Dyspnea on exertion [name] notes increasing shortness of breath with exercising and seems to be worsening over time with weight gain. He notes getting out of breath sooner with activity than he used to. This has gotten worse recently. [name] denies orthopnea.  Diabetes II with Hyperglycemia, not on insulin [name] has a diagnosis of diabetes type II. [name] does not report checking his blood sugars. Last A1c was 6.6 on 06/09/2018. He states he had an eye exam last week. He has been working on intensive lifestyle modifications including diet, exercise, and weight loss to help control his blood glucose levels.  Vitamin D deficiency [name] has a diagnosis of Vitamin D deficiency. He is currently taking OTC Vit D and denies nausea, vomiting or muscle weakness.  At risk for osteopenia and osteoporosis [name] is at higher risk of osteopenia and osteoporosis due to vitamin D deficiency.   Depression Screen [name]'s Food and Mood (modified PHQ-9) score was 16. Depression screen PHQ 2/9 02/15/2019  Decreased Interest 1  Down, Depressed, Hopeless 1  PHQ - 2 Score 2  Altered sleeping 2  Tired, decreased energy 2  Change in appetite  2  Feeling bad or failure about yourself  2  Trouble concentrating 1  Moving slowly or fidgety/restless 0  Suicidal thoughts 0  PHQ-9 Score 11  Difficult doing work/chores Somewhat difficult    ASSESSMENT AND PLAN:  Other fatigue - Plan: EKG 12-Lead, T3, T4, free, TSH  Shortness of breath on exertion - Plan: Lipid Panel With LDL/HDL Ratio, ECHOCARDIOGRAM COMPLETE  Type 2 diabetes mellitus with hyperglycemia, without long-term current use  of insulin (HCC) - Plan: Comprehensive metabolic panel, Hemoglobin A1c, Insulin, random, Microalbumin / creatinine urine ratio  Vitamin D deficiency - Plan: VITAMIN D 25 Hydroxy (Vit-D Deficiency, Fractures)  Depression screening  At risk for osteoporosis  Class 3 severe obesity with serious comorbidity and body mass index (BMI) of 40.0 to 44.9 in adult, unspecified obesity type (HCC)  PLAN:  Fatigue [name] was informed that his fatigue may be related to obesity, depression or many other causes. Labs will be ordered, and in the meanwhile [name] has agreed to work on diet, exercise and weight loss to help with fatigue. Proper sleep hygiene was discussed including the need for 7-8 hours of quality sleep each night. A sleep study was not ordered based on symptoms and Epworth score.  Dyspnea on exertion [name]'s shortness of breath appears to be obesity related and exercise induced. He has agreed to work on weight loss and gradually increase exercise to treat his exercise induced shortness of breath. If [name] follows our instructions and loses weight without improvement of his shortness of breath, we will plan to refer to pulmonology. We will monitor this condition regularly. [name] agrees to this plan.  Diabetes II with Hyperglycemia, not on insulin [name] has been given extensive diabetes education by myself today including ideal fasting and post-prandial blood glucose readings, individual ideal HgA1c goals  and hypoglycemia prevention. We discussed the importance of good blood sugar control to decrease the likelihood of diabetic complications such as nephropathy, neuropathy, limb loss, blindness, coronary artery disease, and death. We discussed the importance of intensive lifestyle modification including diet, exercise and weight loss as the first line treatment for diabetes. [name] will have HgA1c and insulin levels checked. He agrees to continue his diabetes medications and will follow-up at the agreed upon  time.  Vitamin D Deficiency Hamp was informed that low Vitamin D levels contributes to fatigue and are associated with obesity, breast, and colon cancer. He agrees to continue taking OTC Vit D and will follow-up for routine testing of Vitamin D, at least 2-3 times per year. He was informed of the risk of over-replacement of Vitamin D and agrees to not increase his dose unless he discusses this with [name] first. Josimar agrees to follow-up with our clinic in 2 weeks.  At risk for osteopenia and osteoporosis Cayden was given extended  (15 minutes) osteoporosis prevention counseling today. Orlondo is at risk for osteopenia and osteoporosis due to his Vitamin D deficiency. He was encouraged to take his Vitamin D and follow his higher calcium diet and increase strengthening exercise to help strengthen his bones and decrease his risk of osteopenia and osteoporosis.  Depression Screen Luis had a strongly positive depression screening. Depression is commonly associated with obesity and often results in emotional eating behaviors. We will monitor this closely and work on CBT to help improve the non-hunger eating patterns. Referral to Psychology may be required if no improvement is seen as he continues in our clinic.  Obesity Linkyn is currently in the action stage of change and his  goal is to continue with weight loss efforts. I recommend Venkat begin the structured treatment plan as follows:  He has agreed to follow the Category 2 plan.  Dior has been instructed to eventually work up to a goal of 150 minutes of combined cardio and strengthening exercise per week for weight loss and overall health benefits. We discussed the following Behavioral Modification Strategies today: increasing lean protein intake, increasing vegetables, work on meal planning and easy cooking plans, keeping healthy foods in the home, and planning for success.   He was informed of the importance of frequent follow-up visits to maximize his  success with intensive lifestyle modifications for his multiple health conditions. He was informed we would discuss his lab results at his next visit unless there is a critical issue that needs to be addressed sooner. Dashel agreed to keep his next visit at the agreed upon time to discuss these results.  ALLERGIES: No Known Allergies  MEDICATIONS: Current Outpatient Medications on File Prior to Visit  Medication Sig Dispense Refill  . acetaminophen (TYLENOL) 500 MG tablet Take 1,000 mg by mouth every 6 (six) hours as needed for moderate pain.    . blood glucose meter kit and supplies KIT Test daily Dx  r73.03 1 each 0  . Cyanocobalamin (VITAMIN B-12 PO) Take by mouth.    . cyclobenzaprine (FLEXERIL) 5 MG tablet Take 5 mg by mouth 3 (three) times daily as needed for muscle spasms.    [name] Kitchen latanoprost (XALATAN) 0.005 % ophthalmic solution Place 1 drop into both eyes at bedtime.    . Multiple Vitamins-Minerals (MULTIVITAMIN WITH MINERALS) tablet Take 1 tablet by mouth daily.    . Oxycodone HCl 20 MG TABS Take 1 tablet (20 mg total) by mouth 3 (three) times daily as needed. 30 tablet 0  . timolol (BETIMOL) 0.5 % ophthalmic solution Apply 1 drop to eye daily.    [name] Kitchen UNABLE TO FIND 1 pair diabetic shoes 3 pairs of inserts Dx : r73.03 1 each 0   No current facility-administered medications on file prior to visit.     PAST MEDICAL HISTORY: Past Medical History:  Diagnosis Date  . Anemia   . Arthritis    "hips" (01/15/2015)  . Blood transfusion without reported diagnosis   . Chronic lower back pain   . Colon polyps   . Depression    denies  . Diabetes mellitus    diet controlled- no meds since wt loss  . Dysrhythmia    s/p surgery bariatric- cardioversion  . GERD (gastroesophageal reflux disease)   . Hip pain   . History of gout   . Hyperlipidemia   . Hypertension   . Joint pain   . Neuromuscular disorder ([place])   . PONV (postoperative nausea and vomiting)   . Pre-diabetes   . Sleep  apnea    no longer have to use cpap since wt loss  . Stage 3 chronic kidney disease ([place]) 01/26/2018  . Stroke [facility]) 2006   denies residual on 01/15/2015    PAST SURGICAL HISTORY: Past Surgical History:  Procedure Laterality Date  . BARIATRIC SURGERY  2010   in [employer]  . CARDIOVERSION    . COLONOSCOPY N/A 08/02/2014   Procedure: COLONOSCOPY;  Surgeon: Rogene Houston, MD;  [facility];  Service: Endoscopy;  Laterality: N/A;  210 - moved to 3/10 @ 11:55 - Ann notified pt  . ESOPHAGOGASTRODUODENOSCOPY    . HERNIA REPAIR    . LIPOSUCTION    .  PANNICULECTOMY  01/14/2015  . PANNICULECTOMY N/A 01/14/2015   Procedure:  TOTAL PANNICULECTOMY WTH LIPOSUCTION OF SIDES;  Surgeon: Cristine Polio, MD;  Location: [facility];  Service: Plastics;  Laterality: N/A;  . TONSILLECTOMY    . VENTRAL HERNIA REPAIR  01/14/2015  . VENTRAL HERNIA REPAIR N/A 01/14/2015   Procedure: HERNIA REPAIR VENTRAL ADULT AND MUSCLE REPAIR;  Surgeon: Cristine Polio, MD;  Location: [facility];  Service: Plastics;  Laterality: N/A;    SOCIAL HISTORY: Social History   Tobacco Use  . Smoking status: Never Smoker  . Smokeless tobacco: Never Used  Substance Use Topics  . Alcohol use: No  . Drug use: No    FAMILY HISTORY: Family History  Problem Relation Age of Onset  . COPD Mother   . Depression Mother   . Hyperlipidemia Mother   . Hypertension Mother   . Heart disease Mother   . Thyroid disease Mother   . Anxiety disorder Mother   . Diabetes Father   . Pulmonary embolism Father 79       blood clot  . Hyperlipidemia Father   . Sudden death Father   . Obesity Father   . Eating disorder Father   . Hypertension Brother    ROS: Review of Systems  Cardiovascular: Negative for orthopnea.  Gastrointestinal: Negative for nausea and vomiting.  Musculoskeletal:       Negative for muscle weakness.   PHYSICAL EXAM: Blood pressure (!) 152/84, pulse 62, temperature 98.3 F (36.8 C), temperature source Oral,  height '5\' 7"'  (1.702 m), weight 282 lb (127.9 kg), SpO2 100 %. Body mass index is 44.17 kg/m. Physical Exam Vitals signs reviewed.  Constitutional:      Appearance: Normal appearance. He is well-developed. He is obese.  HENT:     Head: Normocephalic and atraumatic.     Nose: Nose normal.  Eyes:     General: No scleral icterus. Neck:     Musculoskeletal: Normal range of motion.  Cardiovascular:     Rate and Rhythm: Normal rate and regular rhythm.  Pulmonary:     Effort: Pulmonary effort is normal. No respiratory distress.  Abdominal:     Palpations: Abdomen is soft.     Tenderness: There is no abdominal tenderness.  Musculoskeletal: Normal range of motion.     Comments: Range of motion normal in all four extremities.  Skin:    General: Skin is warm and dry.  Neurological:     Mental Status: He is alert and oriented to person, place, and time.     Coordination: Coordination normal.  Psychiatric:        Mood and Affect: Mood and affect normal.        Behavior: Behavior normal.   RECENT LABS AND TESTS: BMET    Component Value Date/Time   NA 139 02/15/2019 1050   K 4.5 02/15/2019 1050   CL 99 02/15/2019 1050   CO2 23 02/15/2019 1050   GLUCOSE 136 (H) 02/15/2019 1050   GLUCOSE 459 (H) 07/25/2018 1706   BUN 19 02/15/2019 1050   CREATININE 1.42 (H) 02/15/2019 1050   CREATININE 1.67 (H) 02/11/2017 1452   CALCIUM 9.5 02/15/2019 1050   GFRNONAA 58 (L) 02/15/2019 1050   GFRNONAA 48 (L) 02/11/2017 1452   GFRAA 67 02/15/2019 1050   GFRAA 56 (L) 02/11/2017 1452   Lab Results  Component Value Date   HGBA1C 6.4 (H) 02/15/2019   Lab Results  Component Value Date   INSULIN 17.7 02/15/2019   CBC  Component Value Date/Time   WBC 8.4 08/26/2018 1237   WBC 21.8 (H) 07/25/2018 1707   RBC 4.58 08/26/2018 1237   RBC 4.61 07/25/2018 1707   HGB 13.6 08/26/2018 1237   HCT 38.9 08/26/2018 1237   PLT 262 08/26/2018 1237   MCV 85 08/26/2018 1237   MCH 29.7 08/26/2018 1237    MCH 27.3 07/25/2018 1707   MCHC 35.0 08/26/2018 1237   MCHC 32.2 07/25/2018 1707   RDW 17.8 (H) 08/26/2018 1237   LYMPHSABS 2.2 08/26/2018 1237   MONOABS 0.7 02/20/2016 0127   EOSABS 0.2 08/26/2018 1237   BASOSABS 0.1 08/26/2018 1237   Iron/TIBC/Ferritin/ %Sat    Component Value Date/Time   IRON 88 06/09/2018 0959   TIBC 366 06/09/2018 0959   FERRITIN 15 (L) 06/09/2018 0959   IRONPCTSAT 24 06/09/2018 0959   Lipid Panel     Component Value Date/Time   CHOL 211 (H) 02/15/2019 1050   TRIG 220 (H) 02/15/2019 1050   HDL 37 (L) 02/15/2019 1050   CHOLHDL 5.4 (H) 01/26/2018 1011   CHOLHDL 5.3 (H) 02/11/2017 1452   LDLCALC 134 (H) 02/15/2019 1050   LDLCALC 124 (H) 02/11/2017 1452   Hepatic Function Panel     Component Value Date/Time   PROT 7.0 02/15/2019 1050   ALBUMIN 3.8 (L) 02/15/2019 1050   AST 25 02/15/2019 1050   ALT 24 02/15/2019 1050   ALKPHOS 124 (H) 02/15/2019 1050   BILITOT 0.8 02/15/2019 1050      Component Value Date/Time   TSH 2.200 02/15/2019 1050   TSH 1.250 01/26/2018 1011   No results found for: Vitamin D, 25-Hydroxy  ECG sinus rhythm with a rate of 71 BPM. 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 Abnormal.  INDIRECT CALORIMETER done today shows a VO2 of 232 and a REE of 1617.  His calculated basal metabolic rate is 4193 thus his basal metabolic rate is worse than expected.  OBESITY BEHAVIORAL INTERVENTION VISIT  Today's visit was #1  Starting weight: 282 lbs Starting date: 02/15/2019 Today's weight: 282 lbs  Today's date: 02/15/2019 Total lbs lost to date: 0    02/15/2019  Height '5\' 7"'  (1.702 m)  Weight 282 lb (127.9 kg)  BMI (Calculated) 44.16  BLOOD PRESSURE - SYSTOLIC 790  BLOOD PRESSURE - DIASTOLIC 84  Waist Measurement  54 inches   Body Fat % 43.9 %  Total Body Water (lbs) 129.6 lbs  RMR 1617   ASK: We discussed the diagnosis of obesity with [name] today and Dekker agreed to give [name] permission to discuss  obesity behavioral modification therapy today.  ASSESS: Krystopher has the diagnosis of obesity and his BMI today is 44.3. Costa is in the action stage of change.   ADVISE: Estevan was educated on the multiple health risks of obesity as well as the benefit of weight loss to improve his health. He was advised of the need for long term treatment and the importance of lifestyle modifications to improve his current health and to decrease his risk of future health problems.  AGREE: Multiple dietary modification options and treatment options were discussed and  Adriell agreed to follow the recommendations documented in the above note.  ARRANGE: Kolbe was educated on the importance of frequent visits to treat obesity as outlined per CMS and USPSTF guidelines and agreed to schedule his next follow up appointment today.  IMichaelene Song, am acting as transcriptionist for Ilene Qua, MD  I have reviewed the above  documentation for accuracy and completeness, and I agree with the above. - Ilene Qua, MD

## 2019-03-01 ENCOUNTER — Ambulatory Visit (INDEPENDENT_AMBULATORY_CARE_PROVIDER_SITE_OTHER): Payer: Medicaid Other | Admitting: Family Medicine

## 2019-03-20 DIAGNOSIS — M545 Low back pain: Secondary | ICD-10-CM | POA: Diagnosis not present

## 2019-03-20 DIAGNOSIS — M79652 Pain in left thigh: Secondary | ICD-10-CM | POA: Diagnosis not present

## 2019-03-20 DIAGNOSIS — M25559 Pain in unspecified hip: Secondary | ICD-10-CM | POA: Diagnosis not present

## 2019-03-20 DIAGNOSIS — G894 Chronic pain syndrome: Secondary | ICD-10-CM | POA: Diagnosis not present

## 2019-03-20 DIAGNOSIS — Z79891 Long term (current) use of opiate analgesic: Secondary | ICD-10-CM | POA: Diagnosis not present

## 2019-03-21 ENCOUNTER — Other Ambulatory Visit: Payer: Self-pay

## 2019-03-21 ENCOUNTER — Encounter (INDEPENDENT_AMBULATORY_CARE_PROVIDER_SITE_OTHER): Payer: Self-pay | Admitting: Bariatrics

## 2019-03-21 ENCOUNTER — Ambulatory Visit (INDEPENDENT_AMBULATORY_CARE_PROVIDER_SITE_OTHER): Payer: Medicaid Other | Admitting: Bariatrics

## 2019-03-21 VITALS — BP 130/82 | HR 69 | Temp 98.4°F | Ht 67.0 in | Wt 285.0 lb

## 2019-03-21 DIAGNOSIS — E559 Vitamin D deficiency, unspecified: Secondary | ICD-10-CM

## 2019-03-21 DIAGNOSIS — Z9884 Bariatric surgery status: Secondary | ICD-10-CM | POA: Diagnosis not present

## 2019-03-21 DIAGNOSIS — Z6841 Body Mass Index (BMI) 40.0 and over, adult: Secondary | ICD-10-CM

## 2019-03-21 DIAGNOSIS — F3289 Other specified depressive episodes: Secondary | ICD-10-CM | POA: Diagnosis not present

## 2019-03-21 DIAGNOSIS — E1165 Type 2 diabetes mellitus with hyperglycemia: Secondary | ICD-10-CM

## 2019-03-21 MED ORDER — VITAMIN D (ERGOCALCIFEROL) 1.25 MG (50000 UNIT) PO CAPS: 50000.0000 [IU] | ORAL_CAPSULE | ORAL | 0 refills | Status: DC

## 2019-03-21 MED ORDER — BUPROPION HCL ER (SR) 150 MG PO TB12: 150.0000 mg | ORAL_TABLET | Freq: Every day | ORAL | 0 refills | Status: DC

## 2019-03-21 NOTE — Progress Notes (Signed)
Office: [phone]  /  Fax: [phone]   HPI:   Chief Complaint: OBESITY [name] is here to discuss his progress with his obesity treatment plan. He is on the Category 2 plan and is following his eating plan approximately 50-60% of the time. He states he is biking 30 minutes 3 times per week. [name] is a patient of Dr. Adair Patter and this is his first visit with me. He is up 3 lbs. [name] has a history of gastric bypass in 2010 with no contraindications.  His weight is 285 lb (129.3 kg) today and has had a weight gain of 3 lbs since his last visit. He has lost 0 lbs since starting treatment with [name].  Diabetes II wit Hyperglycemia Brennin has a diagnosis of diabetes type II. [name] does not check his blood sugars but does report some low readings. Last A1c was 6.4 on 02/15/2019 with an insulin of 17.7. He has been working on intensive lifestyle modifications including diet, exercise, and weight loss to help control his blood glucose levels.  Vitamin D deficiency [place] has a diagnosis of Vitamin D deficiency. Last Vitamin D 29.2 on 02/15/2019. He is currently taking prescription Vit D and denies nausea, vomiting or muscle weakness.  Gastric Bypass [place] has a history of gastric bypass in 2010 and has some resistance.  Depression with emotional eating behaviors Tanay is struggling with emotional eating and using food for comfort to the extent that it is negatively impacting his health. He often snacks when he is not hungry. [name] sometimes feels he is out of control and then feels guilty that he made poor food choices. He has been working on behavior modification techniques to help reduce his emotional eating and has been somewhat successful. [name] reports emotional eating. He denies contraindications. He shows no sign of suicidal or homicidal ideations.  Depression screen [name] 2/9 02/15/2019 11/17/2018 08/01/2018 07/25/2018 06/09/2018  Decreased Interest 1 0 _0 Down, Depressed, Hopeless 1 0 _1 PHQ - 2  Score 2 0 _2 Altered sleeping 2 - _3 Tired, decreased energy 2 - _4 Change in appetite 2 - _5 Feeling bad or failure about yourself  2 - _6 Trouble concentrating 1 - 0 1 1  Moving slowly or fidgety/restless 0 - 1 0 0  Suicidal thoughts 0 - 0 0 0  PHQ-9 Score 11 - _7 Difficult doing work/chores Somewhat difficult - Not difficult at all Not difficult at all Not difficult at all   ASSESSMENT AND PLAN:  Type 2 diabetes mellitus with hyperglycemia, without long-term current use of insulin (HCC)  Vitamin D deficiency - Plan: Vitamin D, Ergocalciferol, (DRISDOL) 1.25 MG (50000 UT) CAPS capsule  Gastric bypass status for obesity  Other depression - with emotional eating  - Plan: buPROPion (WELLBUTRIN SR) 150 MG 12 hr tablet  Class 3 severe obesity with serious comorbidity and body mass index (BMI) of 40.0 to 44.9 in adult, unspecified obesity type (HCC)  PLAN:  Diabetes II with Hyperglycemia [name] has been given extensive diabetes education by myself today including ideal fasting and post-prandial blood glucose readings, individual ideal HgA1c goals  and hypoglycemia prevention. We discussed the importance of good blood sugar control to decrease the likelihood of diabetic complications such as nephropathy, neuropathy, limb loss, blindness, coronary artery disease, and death. We discussed the importance of intensive lifestyle modification including diet, exercise and  weight loss as the first line treatment for diabetes. Kong will follow-up with his PCP and will follow-up at the agreed upon time.  Vitamin D Deficiency [name] was informed that low Vitamin D levels contributes to fatigue and are associated with obesity, breast, and colon cancer. He agrees to continue to take prescription Vit D @ 50,000 IU every week #4 with 0 refills and will follow-up for routine testing of Vitamin D, at least 2-3 times per year. He was informed of the risk of over-replacement of Vitamin D and  agrees to not increase his dose unless he discusses this with [name] first. [name] agrees to follow-up with our clinic in 2 weeks.  Gastric Bypass [name] was advised to eat small frequent meals.  Depression with Emotional Eating Behaviors We discussed behavior modification techniques today to help [name] deal with his emotional eating and depression. [name] was given a prescription for Wellbutrin 150 mg 1 PO daily #30 with 0 refills. He agrees to follow-up with our clinic in 2 weeks.  Obesity [name] is currently in the action stage of change. As such, his goal is to continue with weight loss efforts. He has agreed to follow the Category 2 plan with additional Category 1 and 2 options. [name] will not skip meals and will increase his protein intake. [name] has been instructed to walk and continue his bike exercises for weight loss and overall health benefits. We discussed the following Behavioral Modification Strategies today: increasing lean protein intake, decreasing simple carbohydrates, increasing vegetables, increase H20 intake, decrease eating out, no skipping meals, work on meal planning and easy cooking plans, and keeping healthy foods in the home.  [name] has agreed to follow-up with our clinic in 2 weeks. He was informed of the importance of frequent follow-up visits to maximize his success with intensive lifestyle modifications for his multiple health conditions.  ALLERGIES: No Known Allergies  MEDICATIONS: Current Outpatient Medications on File Prior to Visit  Medication Sig Dispense Refill   acetaminophen (TYLENOL) 500 MG tablet Take 1,000 mg by mouth every 6 (six) hours as needed for moderate pain.     blood glucose meter kit and supplies KIT Test daily Dx  r73.03 1 each 0   Cyanocobalamin (VITAMIN B-12 PO) Take by mouth.     latanoprost (XALATAN) 0.005 % ophthalmic solution Place 1 drop into both eyes at bedtime.     Multiple Vitamins-Minerals (MULTIVITAMIN WITH MINERALS) tablet Take 1  tablet by mouth daily.     Oxycodone HCl 20 MG TABS Take 1 tablet (20 mg total) by mouth 3 (three) times daily as needed. 30 tablet 0   timolol (BETIMOL) 0.5 % ophthalmic solution Apply 1 drop to eye daily.     UNABLE TO FIND 1 pair diabetic shoes 3 pairs of inserts Dx : r73.03 1 each 0   cyclobenzaprine (FLEXERIL) 5 MG tablet Take 5 mg by mouth 3 (three) times daily as needed for muscle spasms.     No current facility-administered medications on file prior to visit.     PAST MEDICAL HISTORY: Past Medical History:  Diagnosis Date   Anemia    Arthritis    "hips" (01/15/2015)   Blood transfusion without reported diagnosis    Chronic lower back pain    Colon polyps    Depression    denies   Diabetes mellitus    diet controlled- no meds since wt loss   Dysrhythmia    s/p surgery bariatric- cardioversion   GERD (gastroesophageal reflux disease)  Hip pain    History of gout    Hyperlipidemia    Hypertension    Joint pain    Neuromuscular disorder ([place])    PONV (postoperative nausea and vomiting)    Pre-diabetes    Sleep apnea    no longer have to use cpap since wt loss   Stage 3 chronic kidney disease 01/26/2018   Stroke [facility]) 2006   denies residual on 01/15/2015    PAST SURGICAL HISTORY: Past Surgical History:  Procedure Laterality Date   BARIATRIC SURGERY  2010   in [place] N/A 08/02/2014   Procedure: COLONOSCOPY;  Surgeon: Rogene Houston, MD;  [facility];  Service: Endoscopy;  Laterality: N/A;  210 - moved to 3/10 @ 11:55 - Ann notified pt   ESOPHAGOGASTRODUODENOSCOPY     HERNIA REPAIR     LIPOSUCTION     PANNICULECTOMY  01/14/2015   PANNICULECTOMY N/A 01/14/2015   Procedure:  TOTAL PANNICULECTOMY WTH LIPOSUCTION OF SIDES;  Surgeon: Cristine Polio, MD;  Location: [place];  Service: Plastics;  Laterality: N/A;   TONSILLECTOMY     VENTRAL HERNIA REPAIR  01/14/2015   VENTRAL HERNIA REPAIR  N/A 01/14/2015   Procedure: HERNIA REPAIR VENTRAL ADULT AND MUSCLE REPAIR;  Surgeon: Cristine Polio, MD;  Location: [place];  Service: Plastics;  Laterality: N/A;    SOCIAL HISTORY: Social History   Tobacco Use   Smoking status: Never Smoker   Smokeless tobacco: Never Used  Substance Use Topics   Alcohol use: No   Drug use: No    FAMILY HISTORY: Family History  Problem Relation Age of Onset   COPD Mother    Depression Mother    Hyperlipidemia Mother    Hypertension Mother    Heart disease Mother    Thyroid disease Mother    Anxiety disorder Mother    Diabetes Father    Pulmonary embolism Father 55       blood clot   Hyperlipidemia Father    Sudden death Father    Obesity Father    Eating disorder Father    Hypertension Brother    ROS: Review of Systems  Gastrointestinal: Negative for nausea and vomiting.       Positive for gastric bypass.  Musculoskeletal:       Negative for muscle weakness.  Endo/Heme/Allergies:       Positive for hypoglycemia.  Psychiatric/Behavioral: Positive for depression (emotional eating). Negative for suicidal ideas.       Negative for homicidal ideas.   PHYSICAL EXAM: Blood pressure 130/82, pulse 69, temperature 98.4 F (36.9 C), height _0  (1.702 m), weight 285 lb (129.3 kg), SpO2 99 %. Body mass index is 44.64 kg/m. Physical Exam Vitals signs reviewed.  Constitutional:      Appearance: Normal appearance. He is obese.  Cardiovascular:     Rate and Rhythm: Normal rate.     Pulses: Normal pulses.  Pulmonary:     Effort: Pulmonary effort is normal.     Breath sounds: Normal breath sounds.  Musculoskeletal: Normal range of motion.  Skin:    General: Skin is warm and dry.  Neurological:     Mental Status: He is alert and oriented to person, place, and time.  Psychiatric:        Behavior: Behavior normal.   RECENT LABS AND TESTS: BMET    Component Value Date/Time   NA 139 02/15/2019 1050   K 4.5  02/15/2019 1050  CL 99 02/15/2019 1050   CO2 23 02/15/2019 1050   GLUCOSE 136 (H) 02/15/2019 1050   GLUCOSE 459 (H) 07/25/2018 1706   BUN 19 02/15/2019 1050   CREATININE 1.42 (H) 02/15/2019 1050   CREATININE 1.67 (H) 02/11/2017 1452   CALCIUM 9.5 02/15/2019 1050   GFRNONAA 58 (L) 02/15/2019 1050   GFRNONAA 48 (L) 02/11/2017 1452   GFRAA 67 02/15/2019 1050   GFRAA 56 (L) 02/11/2017 1452   Lab Results  Component Value Date   HGBA1C 6.4 (H) 02/15/2019   HGBA1C 6.6 (A) 06/09/2018   HGBA1C 6.5 (A) 01/26/2018   HGBA1C 6.0 (H) 02/11/2017   HGBA1C 5.6 11/05/2014   Lab Results  Component Value Date   INSULIN 17.7 02/15/2019   CBC    Component Value Date/Time   WBC 8.4 08/26/2018 1237   WBC 21.8 (H) 07/25/2018 1707   RBC 4.58 08/26/2018 1237   RBC 4.61 07/25/2018 1707   HGB 13.6 08/26/2018 1237   HCT 38.9 08/26/2018 1237   PLT 262 08/26/2018 1237   MCV 85 08/26/2018 1237   MCH 29.7 08/26/2018 1237   MCH 27.3 07/25/2018 1707   MCHC 35.0 08/26/2018 1237   MCHC 32.2 07/25/2018 1707   RDW 17.8 (H) 08/26/2018 1237   LYMPHSABS 2.2 08/26/2018 1237   MONOABS 0.7 02/20/2016 0127   EOSABS 0.2 08/26/2018 1237   BASOSABS 0.1 08/26/2018 1237   Iron/TIBC/Ferritin/ %Sat    Component Value Date/Time   IRON 88 06/09/2018 0959   TIBC 366 06/09/2018 0959   FERRITIN 15 (L) 06/09/2018 0959   IRONPCTSAT 24 06/09/2018 0959   Lipid Panel     Component Value Date/Time   CHOL 211 (H) 02/15/2019 1050   TRIG 220 (H) 02/15/2019 1050   HDL 37 (L) 02/15/2019 1050   CHOLHDL 5.4 (H) 01/26/2018 1011   CHOLHDL 5.3 (H) 02/11/2017 1452   LDLCALC 134 (H) 02/15/2019 1050   LDLCALC 124 (H) 02/11/2017 1452   Hepatic Function Panel     Component Value Date/Time   PROT 7.0 02/15/2019 1050   ALBUMIN 3.8 (L) 02/15/2019 1050   AST 25 02/15/2019 1050   ALT 24 02/15/2019 1050   ALKPHOS 124 (H) 02/15/2019 1050   BILITOT 0.8 02/15/2019 1050      Component Value Date/Time   TSH 2.200 02/15/2019  1050   TSH 1.250 01/26/2018 1011   Results for DONELL, TOMKINS (MRN [id number]) as of 03/21/2019 14:21  Ref. Range 02/15/2019 10:50  Vitamin D, 25-Hydroxy Latest Ref Range: 30.0 - 100.0 ng/mL 29.2 (L)   OBESITY BEHAVIORAL INTERVENTION VISIT  Today's visit was #2  Starting weight: 282 lbs Starting date: 02/15/2019 Today's weight: 285 lbs  Today's date: 03/21/2019 Total lbs lost to date: 0 At least 15 minutes were spent on discussing the following behavioral intervention visit.    03/21/2019  Height _0  (1.702 m)  Weight 285 lb (129.3 kg)  BMI (Calculated) 44.63  BLOOD PRESSURE - SYSTOLIC 762  BLOOD PRESSURE - DIASTOLIC 82   Body Fat % 83.1 %   ASK: We discussed the diagnosis of obesity with [name] today and Ravindra agreed to give [name] permission to discuss obesity behavioral modification therapy today.  ASSESS: Jarelle has the diagnosis of obesity and his BMI today is 44.7. Rathana is in the action stage of change.   ADVISE: Datrell was educated on the multiple health risks of obesity as well as the benefit of weight loss to improve his health. He was advised  of the need for long term treatment and the importance of lifestyle modifications to improve his current health and to decrease his risk of future health problems.  AGREE: Multiple dietary modification options and treatment options were discussed and  Winifred agreed to follow the recommendations documented in the above note.  ARRANGE: Bijon was educated on the importance of frequent visits to treat obesity as outlined per CMS and USPSTF guidelines and agreed to schedule his next follow up appointment today.  Migdalia Dk, am acting as Location manager for [employer], DO  I have reviewed the above documentation for accuracy and completeness, and I agree with the above. -Jearld Lesch, DO

## 2019-04-11 ENCOUNTER — Encounter (INDEPENDENT_AMBULATORY_CARE_PROVIDER_SITE_OTHER): Payer: Self-pay | Admitting: Family Medicine

## 2019-04-11 ENCOUNTER — Ambulatory Visit (INDEPENDENT_AMBULATORY_CARE_PROVIDER_SITE_OTHER): Payer: Medicaid Other | Admitting: Family Medicine

## 2019-04-11 ENCOUNTER — Other Ambulatory Visit: Payer: Self-pay

## 2019-04-11 VITALS — BP 130/85 | HR 83 | Temp 98.1°F | Ht 67.0 in | Wt 282.0 lb

## 2019-04-11 DIAGNOSIS — F3289 Other specified depressive episodes: Secondary | ICD-10-CM | POA: Diagnosis not present

## 2019-04-11 DIAGNOSIS — Z9884 Bariatric surgery status: Secondary | ICD-10-CM | POA: Diagnosis not present

## 2019-04-11 DIAGNOSIS — Z6841 Body Mass Index (BMI) 40.0 and over, adult: Secondary | ICD-10-CM

## 2019-04-11 DIAGNOSIS — E559 Vitamin D deficiency, unspecified: Secondary | ICD-10-CM

## 2019-04-11 MED ORDER — VITAMIN D (ERGOCALCIFEROL) 1.25 MG (50000 UNIT) PO CAPS: 50000.0000 [IU] | ORAL_CAPSULE | ORAL | 0 refills | Status: DC

## 2019-04-11 MED ORDER — BUPROPION HCL ER (SR) 150 MG PO TB12: 150.0000 mg | ORAL_TABLET | Freq: Every day | ORAL | 0 refills | Status: DC

## 2019-04-12 NOTE — Progress Notes (Addendum)
Office: [phone]  /  Fax: [phone]   HPI:   Chief Complaint: OBESITY [name] is here to discuss his progress with his obesity treatment plan. He is on the Category 1 plan and the Category 2 plan and is following his eating plan approximately 70 % of the time. He states he is exercising at the gym 60 minutes 2 times per week. [name] does deviate from the plan quite a bit. [name] has a 30 gram protein shake at breakfast with an apple. He has two eggs and one piece of toast for lunch. He often has a microwave meal for dinner. [name] is not getting enough protein.  He is status post gastric bypass by [name] in October 2010 and is down from 595 pounds. He lost down to 245 lbs and has regained some weight.  He takes one multi-vitamin per day. He has not followed up with [name] in 5 years.  His weight is 282 lb (127.9 kg) today and has had a weight loss of 3 pounds over a period of 3 weeks since his last visit. He has lost 0 lbs since starting treatment with [name].  Vitamin D deficiency [name] has a diagnosis of vitamin D deficiency. His last vitamin D level was at 29.2 (02/15/19) and was at goal. [name] is currently taking vit D and he denies nausea, vomiting or muscle weakness.  Depression with emotional eating behaviors [name] struggles with emotional eating and using food for comfort to the extent that it is negatively impacting his health. He often snacks when he is not hungry. Marke sometimes feels he is out of control and then feels guilty that he made poor food choices. [name] feels Bupropion is helping with cravings. He denies any side effects. He has been working on behavior modification techniques to help reduce his emotional eating and has been somewhat successful. He shows no sign of suicidal or homicidal ideations.  History of Gastric Bypass [name] has a history of gastric bypass and he is only takes one multi-vitamin daily. He does not follow up with [name] and he has  not in five years. Last B12 was > 2000 Jan 2020. He has not had B1 checked recently.   ASSESSMENT AND PLAN:   Vitamin D deficiency - Plan: Vitamin D, Ergocalciferol, (DRISDOL) 1.25 MG (50000 UT) CAPS capsule  Other depression - with emotional eating  - Plan: buPROPion (WELLBUTRIN SR) 150 MG 12 hr tablet  H/O gastric bypass  Class 3 severe obesity with serious comorbidity and body mass index (BMI) of 40.0 to 44.9 in adult, unspecified obesity type ([place])  PLAN:  Vitamin D Deficiency [name] was informed that low vitamin D levels contributes to fatigue and are associated with obesity, breast, and colon cancer. [name] agrees to continue to take prescription Vit D _0 ,000 IU every week #4 with no refills and he will follow up for routine testing of vitamin D, at least 2-3 times per year. He was informed of the risk of over-replacement of vitamin D and agrees to not increase his dose unless he discusses this with [name] first. [name] agrees to follow up with our clinic in 2 weeks.  Depression with Emotional Eating Behaviors We discussed behavior modification techniques today to help Job deal with his emotional eating and depression. [name] has agreed to continue Bupropion 150 mg qAm #30 with no refills and follow up as directed.  History of Gastric Bypass [name] agreed to take 2 multi-vitamins daily. We will check vitamin B1  at the next lab check. [name] agrees to follow up with our clinic in 2 weeks.  Obesity [name] is currently in the action stage of change. As such, his goal is to continue with weight loss efforts He has agreed to keep a food journal with 1200 to 1250 calories and 80 to 90 grams of protein daily [name] will switch to journaling since he is not following the Category 2 plan well. I emphasized the importance of adequate protein.  [name] will continue exercising at the gym 3 times per week for weight loss and overall health benefits. We discussed the following Behavioral Modification Strategies  today: planning for success, increasing lean protein intake and work on meal planning and easy cooking plans  [name] has agreed to follow up with our clinic in 2 weeks. He was informed of the importance of frequent follow up visits to maximize his success with intensive lifestyle modifications for his multiple health conditions.  ALLERGIES: No Known Allergies  MEDICATIONS: Current Outpatient Medications on File Prior to Visit  Medication Sig Dispense Refill  . acetaminophen (TYLENOL) 500 MG tablet Take 1,000 mg by mouth every 6 (six) hours as needed for moderate pain.    . blood glucose meter kit and supplies KIT Test daily Dx  r73.03 1 each 0  . Cyanocobalamin (VITAMIN B-12 PO) Take by mouth.    . latanoprost (XALATAN) 0.005 % ophthalmic solution Place 1 drop into both eyes at bedtime.    . Multiple Vitamins-Minerals (MULTIVITAMIN WITH MINERALS) tablet Take 1 tablet by mouth daily.    . Oxycodone HCl 20 MG TABS Take 1 tablet (20 mg total) by mouth 3 (three) times daily as needed. 30 tablet 0  . timolol (BETIMOL) 0.5 % ophthalmic solution Apply 1 drop to eye daily.    Marland Kitchen UNABLE TO FIND 1 pair diabetic shoes 3 pairs of inserts Dx : r73.03 1 each 0  . cyclobenzaprine (FLEXERIL) 5 MG tablet Take 5 mg by mouth 3 (three) times daily as needed for muscle spasms.     No current facility-administered medications on file prior to visit.     PAST MEDICAL HISTORY: Past Medical History:  Diagnosis Date  . Anemia   . Arthritis    "hips" (01/15/2015)  . Blood transfusion without reported diagnosis   . Chronic lower back pain   . Colon polyps   . Depression    denies  . Diabetes mellitus    diet controlled- no meds since wt loss  . Dysrhythmia    s/p surgery bariatric- cardioversion  . GERD (gastroesophageal reflux disease)   . Hip pain   . History of gout   . Hyperlipidemia   . Hypertension   . Joint pain   . Neuromuscular disorder ([place])   . PONV (postoperative nausea and vomiting)   .  Pre-diabetes   . Sleep apnea    no longer have to use cpap since wt loss  . Stage 3 chronic kidney disease 01/26/2018  . Stroke [facility]) 2006   denies residual on 01/15/2015    PAST SURGICAL HISTORY: Past Surgical History:  Procedure Laterality Date  . BARIATRIC SURGERY  2010   in [place]  . CARDIOVERSION    . COLONOSCOPY N/A 08/02/2014   Procedure: COLONOSCOPY;  Surgeon: Rogene Houston, MD;  Location[facility];  Service: Endoscopy;  Laterality: N/A;  210 - moved to 3/10 @ 11:55 - Ann notified pt  . ESOPHAGOGASTRODUODENOSCOPY    . HERNIA REPAIR    .  LIPOSUCTION    . PANNICULECTOMY  01/14/2015  . PANNICULECTOMY N/A 01/14/2015   Procedure:  TOTAL PANNICULECTOMY WTH LIPOSUCTION OF SIDES;  Surgeon: Cristine Polio, MD;  Location: [place];  Service: Plastics;  Laterality: N/A;  . TONSILLECTOMY    . VENTRAL HERNIA REPAIR  01/14/2015  . VENTRAL HERNIA REPAIR N/A 01/14/2015   Procedure: HERNIA REPAIR VENTRAL ADULT AND MUSCLE REPAIR;  Surgeon: Cristine Polio, MD;  Location: [place];  Service: Plastics;  Laterality: N/A;    SOCIAL HISTORY: Social History   Tobacco Use  . Smoking status: Never Smoker  . Smokeless tobacco: Never Used  Substance Use Topics  . Alcohol use: No  . Drug use: No    FAMILY HISTORY: Family History  Problem Relation Age of Onset  . COPD Mother   . Depression Mother   . Hyperlipidemia Mother   . Hypertension Mother   . Heart disease Mother   . Thyroid disease Mother   . Anxiety disorder Mother   . Diabetes Father   . Pulmonary embolism Father 7       blood clot  . Hyperlipidemia Father   . Sudden death Father   . Obesity Father   . Eating disorder Father   . Hypertension Brother     ROS: Review of Systems  Constitutional: Positive for weight loss.  Gastrointestinal: Negative for nausea and vomiting.  Musculoskeletal:       Negative for muscle weakness  Psychiatric/Behavioral: Positive for depression. Negative for suicidal ideas.    PHYSICAL  EXAM: Blood pressure 130/85, pulse 83, temperature 98.1 F (36.7 C), temperature source Oral, height _0  (1.702 m), weight 282 lb (127.9 kg), SpO2 99 %. Body mass index is 44.17 kg/m. Physical Exam Vitals signs reviewed.  Constitutional:      Appearance: Normal appearance. He is well-developed. He is obese.  Cardiovascular:     Rate and Rhythm: Normal rate.  Pulmonary:     Effort: Pulmonary effort is normal.  Musculoskeletal: Normal range of motion.  Skin:    General: Skin is warm and dry.  Neurological:     Mental Status: He is alert and oriented to person, place, and time.  Psychiatric:        Mood and Affect: Mood normal.        Behavior: Behavior normal.     RECENT LABS AND TESTS: BMET    Component Value Date/Time   NA 139 02/15/2019 1050   K 4.5 02/15/2019 1050   CL 99 02/15/2019 1050   CO2 23 02/15/2019 1050   GLUCOSE 136 (H) 02/15/2019 1050   GLUCOSE 459 (H) 07/25/2018 1706   BUN 19 02/15/2019 1050   CREATININE 1.42 (H) 02/15/2019 1050   CREATININE 1.67 (H) 02/11/2017 1452   CALCIUM 9.5 02/15/2019 1050   GFRNONAA 58 (L) 02/15/2019 1050   GFRNONAA 48 (L) 02/11/2017 1452   GFRAA 67 02/15/2019 1050   GFRAA 56 (L) 02/11/2017 1452   Lab Results  Component Value Date   HGBA1C 6.4 (H) 02/15/2019   HGBA1C 6.6 (A) 06/09/2018   HGBA1C 6.5 (A) 01/26/2018   HGBA1C 6.0 (H) 02/11/2017   HGBA1C 5.6 11/05/2014   Lab Results  Component Value Date   INSULIN 17.7 02/15/2019   CBC    Component Value Date/Time   WBC 8.4 08/26/2018 1237   WBC 21.8 (H) 07/25/2018 1707   RBC 4.58 08/26/2018 1237   RBC 4.61 07/25/2018 1707   HGB 13.6 08/26/2018 1237   HCT 38.9 08/26/2018 1237  PLT 262 08/26/2018 1237   MCV 85 08/26/2018 1237   MCH 29.7 08/26/2018 1237   MCH 27.3 07/25/2018 1707   MCHC 35.0 08/26/2018 1237   MCHC 32.2 07/25/2018 1707   RDW 17.8 (H) 08/26/2018 1237   LYMPHSABS 2.2 08/26/2018 1237   MONOABS 0.7 02/20/2016 0127   EOSABS 0.2 08/26/2018 1237    BASOSABS 0.1 08/26/2018 1237   Iron/TIBC/Ferritin/ %Sat    Component Value Date/Time   IRON 88 06/09/2018 0959   TIBC 366 06/09/2018 0959   FERRITIN 15 (L) 06/09/2018 0959   IRONPCTSAT 24 06/09/2018 0959   Lipid Panel     Component Value Date/Time   CHOL 211 (H) 02/15/2019 1050   TRIG 220 (H) 02/15/2019 1050   HDL 37 (L) 02/15/2019 1050   CHOLHDL 5.4 (H) 01/26/2018 1011   CHOLHDL 5.3 (H) 02/11/2017 1452   LDLCALC 134 (H) 02/15/2019 1050   LDLCALC 124 (H) 02/11/2017 1452   Hepatic Function Panel     Component Value Date/Time   PROT 7.0 02/15/2019 1050   ALBUMIN 3.8 (L) 02/15/2019 1050   AST 25 02/15/2019 1050   ALT 24 02/15/2019 1050   ALKPHOS 124 (H) 02/15/2019 1050   BILITOT 0.8 02/15/2019 1050      Component Value Date/Time   TSH 2.200 02/15/2019 1050   TSH 1.250 01/26/2018 1011     Ref. Range 02/15/2019 10:50  Vitamin D, 25-Hydroxy Latest Ref Range: 30.0 - 100.0 ng/mL 29.2 (L)    OBESITY BEHAVIORAL INTERVENTION VISIT  Today's visit was # 3   Starting weight: 282 lbs Starting date: 02/15/2019 Today's weight : 282 lbs Today's date: 04/11/2019 Total lbs lost to date: 0    04/11/2019  Height _0  (1.702 m)  Weight 282 lb (127.9 kg)  BMI (Calculated) 44.16  BLOOD PRESSURE - SYSTOLIC 093  BLOOD PRESSURE - DIASTOLIC 85   Body Fat % 26.7 %  Total Body Water (lbs) 125 lbs    ASK: We discussed the diagnosis of obesity with [name] today and Delta agreed to give [name] permission to discuss obesity behavioral modification therapy today.  ASSESS: Story has the diagnosis of obesity and his BMI today is 44.16 Brandn is in the action stage of change   ADVISE: Dartagnan was educated on the multiple health risks of obesity as well as the benefit of weight loss to improve his health. He was advised of the need for long term treatment and the importance of lifestyle modifications to improve his current health and to decrease his risk of future health problems.  AGREE:  Multiple dietary modification options and treatment options were discussed and  Read agreed to follow the recommendations documented in the above note.  ARRANGE: Dondrell was educated on the importance of frequent visits to treat obesity as outlined per CMS and USPSTF guidelines and agreed to schedule his next follow up appointment today.  I, Doreene Nest, am acting as transcriptionist for [employer], FNP-C  I have reviewed the above documentation for accuracy and completeness, and I agree with the above.  - Konor Noren, FNP-C.

## 2019-04-13 ENCOUNTER — Encounter (INDEPENDENT_AMBULATORY_CARE_PROVIDER_SITE_OTHER): Payer: Self-pay | Admitting: Family Medicine

## 2019-04-13 DIAGNOSIS — E785 Hyperlipidemia, unspecified: Secondary | ICD-10-CM | POA: Diagnosis not present

## 2019-04-13 DIAGNOSIS — Z9884 Bariatric surgery status: Secondary | ICD-10-CM | POA: Diagnosis not present

## 2019-04-13 DIAGNOSIS — Z6841 Body Mass Index (BMI) 40.0 and over, adult: Secondary | ICD-10-CM | POA: Insufficient documentation

## 2019-04-13 DIAGNOSIS — R7303 Prediabetes: Secondary | ICD-10-CM | POA: Diagnosis not present

## 2019-04-13 DIAGNOSIS — N183 Chronic kidney disease, stage 3 unspecified: Secondary | ICD-10-CM | POA: Diagnosis not present

## 2019-04-13 DIAGNOSIS — M160 Bilateral primary osteoarthritis of hip: Secondary | ICD-10-CM | POA: Diagnosis not present

## 2019-04-13 DIAGNOSIS — F32A Depression, unspecified: Secondary | ICD-10-CM | POA: Insufficient documentation

## 2019-04-13 DIAGNOSIS — E559 Vitamin D deficiency, unspecified: Secondary | ICD-10-CM | POA: Insufficient documentation

## 2019-04-13 DIAGNOSIS — Z1159 Encounter for screening for other viral diseases: Secondary | ICD-10-CM | POA: Diagnosis not present

## 2019-04-13 DIAGNOSIS — F329 Major depressive disorder, single episode, unspecified: Secondary | ICD-10-CM | POA: Insufficient documentation

## 2019-04-14 NOTE — Progress Notes (Signed)
Please call [name] and let him know that his labs are back. He has a elevation in fasting glucose.  A1c is still at 6.4% this stable, consistent with prediabetic watching diet can be the most beneficial.  In addition to exercise.  kidney function seems a little bit better than it was earlier this year. Still on the lower end than we would like.  BUN was little bit elevated as well possible need for hydration please encourage water intake. Liver function looks good, thyroid level in normal range. Vitamin D is on the lower end that it was even a month ago.  Continue prescription dose vitamin D. Vitamin B is in good range. Cholesterol is elevated all across the board.  Has demonstrated elevation in triglycerides, bad cholesterol and total cholesterol.  Needs to be on a statin medication at this time especially being in the prediabetic range. If he is willing to start a statin medication I will send this into the pharmacy. He has had consistently elevated cholesterol over the last 2 years which is why recommend starting a statin medication versus continuing diet changes.

## 2019-04-17 LAB — HEP C AB W/REFL
HEPATITIS C ANTIBODY REFILL$(REFL): NONREACTIVE
SIGNAL TO CUT-OFF: 0.06 (ref <1.00)

## 2019-04-17 LAB — COMPLETE METABOLIC PANEL WITH GFR
AG Ratio: 1.3 (calc) (ref 1.0–2.5)
ALT: 22 U/L (ref 9–46)
AST: 22 U/L (ref 10–40)
Albumin: 4.1 g/dL (ref 3.6–5.1)
Alkaline phosphatase (APISO): 106 U/L (ref 36–130)
BUN/Creatinine Ratio: 17 (calc) (ref 6–22)
BUN: 29 mg/dL — ABNORMAL HIGH (ref 7–25)
CO2: 24 mmol/L (ref 20–32)
Calcium: 9.5 mg/dL (ref 8.6–10.3)
Chloride: 103 mmol/L (ref 98–110)
Creat: 1.66 mg/dL — ABNORMAL HIGH (ref 0.60–1.35)
GFR, Est African American: 55 mL/min/{1.73_m2} — ABNORMAL LOW (ref >=60)
GFR, Est Non African American: 48 mL/min/{1.73_m2} — ABNORMAL LOW (ref >=60)
Globulin: 3.2 g/dL (calc) (ref 1.9–3.7)
Glucose, Bld: 152 mg/dL — ABNORMAL HIGH (ref 65–99)
Potassium: 4.6 mmol/L (ref 3.5–5.3)
Sodium: 138 mmol/L (ref 135–146)
Total Bilirubin: 0.8 mg/dL (ref 0.2–1.2)
Total Protein: 7.3 g/dL (ref 6.1–8.1)

## 2019-04-17 LAB — LIPID PANEL
Cholesterol: 223 mg/dL — ABNORMAL HIGH (ref <200)
HDL: 34 mg/dL — ABNORMAL LOW (ref >=40)
LDL Cholesterol (Calc): 141 mg/dL (calc) — ABNORMAL HIGH
Non-HDL Cholesterol (Calc): 189 mg/dL (calc) — ABNORMAL HIGH (ref <130)
Total CHOL/HDL Ratio: 6.6 (calc) — ABNORMAL HIGH (ref <5.0)
Triglycerides: 338 mg/dL — ABNORMAL HIGH (ref <150)

## 2019-04-17 LAB — HEMOGLOBIN A1C
Hgb A1c MFr Bld: 6.4 % of total Hgb — ABNORMAL HIGH (ref <5.7)
Mean Plasma Glucose: 137 (calc)
eAG (mmol/L): 7.6 (calc)

## 2019-04-17 LAB — REFLEX TIQ

## 2019-04-17 LAB — VITAMIN B12: Vitamin B-12: 815 pg/mL (ref 200–1100)

## 2019-04-17 LAB — VITAMIN D 25 HYDROXY (VIT D DEFICIENCY, FRACTURES): Vit D, 25-Hydroxy: 27 ng/mL — ABNORMAL LOW (ref 30–100)

## 2019-04-17 LAB — TSH: TSH: 1.81 mIU/L (ref 0.40–4.50)

## 2019-04-18 ENCOUNTER — Other Ambulatory Visit: Payer: Self-pay | Admitting: Family Medicine

## 2019-04-18 DIAGNOSIS — E785 Hyperlipidemia, unspecified: Secondary | ICD-10-CM

## 2019-04-18 MED ORDER — ATORVASTATIN CALCIUM 10 MG PO TABS: 10.0000 mg | ORAL_TABLET | Freq: Every day | ORAL | 1 refills | Status: DC

## 2019-04-19 ENCOUNTER — Telehealth (INDEPENDENT_AMBULATORY_CARE_PROVIDER_SITE_OTHER): Payer: Medicaid Other | Admitting: Family Medicine

## 2019-04-19 ENCOUNTER — Encounter: Payer: Self-pay | Admitting: Family Medicine

## 2019-04-19 ENCOUNTER — Other Ambulatory Visit: Payer: Self-pay

## 2019-04-19 DIAGNOSIS — E785 Hyperlipidemia, unspecified: Secondary | ICD-10-CM | POA: Diagnosis not present

## 2019-04-19 DIAGNOSIS — E559 Vitamin D deficiency, unspecified: Secondary | ICD-10-CM

## 2019-04-19 DIAGNOSIS — E1165 Type 2 diabetes mellitus with hyperglycemia: Secondary | ICD-10-CM

## 2019-04-19 DIAGNOSIS — Z6841 Body Mass Index (BMI) 40.0 and over, adult: Secondary | ICD-10-CM | POA: Diagnosis not present

## 2019-04-19 DIAGNOSIS — Z9884 Bariatric surgery status: Secondary | ICD-10-CM | POA: Diagnosis not present

## 2019-04-19 DIAGNOSIS — N529 Male erectile dysfunction, unspecified: Secondary | ICD-10-CM

## 2019-04-19 MED ORDER — SILDENAFIL CITRATE 25 MG PO TABS: 25.0000 mg | ORAL_TABLET | Freq: Every day | ORAL | 0 refills | Status: DC | PRN

## 2019-04-19 NOTE — Patient Instructions (Addendum)
  I appreciate the opportunity to provide you with care for your health and wellness. Today we discussed: Hyperlipidemia and weight  Follow up: June 2021 for annual with vision  No labs or referrals today    I hope you have a wonderful, happy, safe, and healthy Holiday Season! See you in the New Year :)  Please continue to practice social distancing to keep you, your family, and our community safe.  If you must go out, please wear a mask and practice good handwashing.  It was a pleasure to see you and I look forward to continuing to work together on your health and well-being. Please do not hesitate to call the office if you need care or have questions about your care.  Have a wonderful day and week. With Gratitude, Cherly Beach, DNP, AGNP-BC

## 2019-04-19 NOTE — Progress Notes (Signed)
Virtual Visit via Video Note   This visit type was conducted due to national recommendations for restrictions regarding the COVID-19 Pandemic (e.g. social distancing) in an effort to limit this patient's exposure and mitigate transmission in our community.  Due to his co-morbid illnesses, this patient is at least at moderate risk for complications without adequate follow up.  This format is felt to be most appropriate for this patient at this time.  All issues noted in this document were discussed and addressed.  A limited physical exam was performed with this format.   Evaluation Performed:  Follow-up visit  Date:  04/19/2019   ID:  [name], DOB [birth year], MRN [id number]  Patient Location: Home Provider Location: Office  Location of Patient: Home Location of Provider: Telehealth Consent was obtain for visit to be over via telehealth. I verified that I am speaking with the correct person using two identifiers.  PCP:  Perlie Mayo, NP   Chief Complaint: Follow-up for hyperlipidemia, obesity  History of Present Illness:    [name] is a 49 y.o. male with history of anemia, depression, diabetes, GERD, gastric bypass, hyperlipidemia, hypertension among others.  Reports today for follow-up on hyperlipidemia and obesity.  Is being followed by the [facility].  Hyperlipidemia: Reports they try to eat more protein, using protein shakes.  Has not picked up his Lipitor yet. Obesity: More exercises: going to the gym, riding bike. Diet includes more proteins.  Trying to avoid sugars and carbs, but harder around holiday.  Additionally he reports that he emotionally eats.  Was started on Wellbutrin by the weight management clinic.  Reports that is kind of making him a little bit short and snappy but he is just recently started on it so reports that he might need a little bit more time on it.  Is willing to give it more time.  Can tell that it is helping move  his cravings.  The patient does not have symptoms concerning for COVID-19 infection (fever, chills, cough, or new shortness of breath).   Past Medical, Surgical, Social History, Allergies, and Medications have been Reviewed.  Past Medical History:  Diagnosis Date  . Anemia   . Arthritis    "hips" (01/15/2015)  . Blood transfusion without reported diagnosis   . Chronic lower back pain   . Colon polyps   . Depression    denies  . Diabetes mellitus    diet controlled- no meds since wt loss  . Dysrhythmia    s/p surgery bariatric- cardioversion  . GERD (gastroesophageal reflux disease)   . Hip pain   . History of gout   . Hyperlipidemia   . Hypertension   . Joint pain   . Neuromuscular disorder ([place])   . PONV (postoperative nausea and vomiting)   . Pre-diabetes   . Sleep apnea    no longer have to use cpap since wt loss  . Stage 3 chronic kidney disease 01/26/2018  . Stroke [facility]) 2006   denies residual on 01/15/2015   Past Surgical History:  Procedure Laterality Date  . BARIATRIC SURGERY  2010   in [place]  . CARDIOVERSION    . COLONOSCOPY N/A 08/02/2014   Procedure: COLONOSCOPY;  Surgeon: Rogene Houston, MD;  [facility];  Service: Endoscopy;  Laterality: N/A;  210 - moved to 3/10 @ 11:55 - Ann notified pt  . ESOPHAGOGASTRODUODENOSCOPY    . HERNIA REPAIR    .  LIPOSUCTION    . PANNICULECTOMY  01/14/2015  . PANNICULECTOMY N/A 01/14/2015   Procedure:  TOTAL PANNICULECTOMY WTH LIPOSUCTION OF SIDES;  Surgeon: Cristine Polio, MD;  Location: [facility];  Service: Plastics;  Laterality: N/A;  . TONSILLECTOMY    . VENTRAL HERNIA REPAIR  01/14/2015  . VENTRAL HERNIA REPAIR N/A 01/14/2015   Procedure: HERNIA REPAIR VENTRAL ADULT AND MUSCLE REPAIR;  Surgeon: Cristine Polio, MD;  Location: [place];  Service: Plastics;  Laterality: N/A;     Current Meds  Medication Sig  . acetaminophen (TYLENOL) 500 MG tablet Take 1,000 mg by mouth every 6 (six) hours as needed for moderate  pain.  Marland Kitchen atorvastatin (LIPITOR) 10 MG tablet Take 1 tablet (10 mg total) by mouth daily.  . blood glucose meter kit and supplies KIT Test daily Dx  r73.03  . buPROPion (WELLBUTRIN SR) 150 MG 12 hr tablet Take 1 tablet (150 mg total) by mouth daily.  . Cyanocobalamin (VITAMIN B-12 PO) Take by mouth.  . latanoprost (XALATAN) 0.005 % ophthalmic solution Place 1 drop into both eyes at bedtime.  . Multiple Vitamins-Minerals (MULTIVITAMIN WITH MINERALS) tablet Take 1 tablet by mouth daily.  . Oxycodone HCl 20 MG TABS Take 1 tablet (20 mg total) by mouth 3 (three) times daily as needed.  . timolol (BETIMOL) 0.5 % ophthalmic solution Apply 1 drop to eye daily.  Marland Kitchen UNABLE TO FIND 1 pair diabetic shoes 3 pairs of inserts Dx : r73.03  . Vitamin D, Ergocalciferol, (DRISDOL) 1.25 MG (50000 UT) CAPS capsule Take 1 capsule (50,000 Units total) by mouth every 7 (seven) days.     Allergies:   Patient has no known allergies.   Social History   Tobacco Use  . Smoking status: Never Smoker  . Smokeless tobacco: Never Used  Substance Use Topics  . Alcohol use: No  . Drug use: No     Family Hx: The patient's family history includes Anxiety disorder in his mother; COPD in his mother; Depression in his mother; Diabetes in his father; Eating disorder in his father; Heart disease in his mother; Hyperlipidemia in his father and mother; Hypertension in his brother and mother; Obesity in his father; Pulmonary embolism (age of onset: 84) in his father; Sudden death in his father; Thyroid disease in his mother.  ROS:   Please see the history of present illness.     All other systems reviewed and are negative.   Labs/Other Tests and Data Reviewed:    Recent Labs: 08/26/2018: Hemoglobin 13.6; Platelets 262 04/13/2019: ALT 22; BUN 29; Creat 1.66; Potassium 4.6; Sodium 138; TSH 1.81   Recent Lipid Panel Lab Results  Component Value Date/Time   CHOL 223 (H) 04/13/2019 10:26 AM   CHOL 211 (H) 02/15/2019 10:50  AM   TRIG 338 (H) 04/13/2019 10:26 AM   HDL 34 (L) 04/13/2019 10:26 AM   HDL 37 (L) 02/15/2019 10:50 AM   CHOLHDL 6.6 (H) 04/13/2019 10:26 AM   LDLCALC 141 (H) 04/13/2019 10:26 AM    Wt Readings from Last 3 Encounters:  04/11/19 282 lb (127.9 kg)  03/21/19 285 lb (129.3 kg)  02/15/19 282 lb (127.9 kg)     Objective:    Vital Signs:  There were no vitals taken for this visit.   GEN:  Alert and oriented over video EYES:  Clear sclera, good eye movement seen on video RESPIRATORY:  Respirations, no respiratory distress seen over video SKIN:  No rashes or wounds noted over the video PSYCH:  Normal affect, mood, good communication   ASSESSMENT & PLAN:    1. Class 3 severe obesity with serious comorbidity and body mass index (BMI) of 40.0 to 44.9 in adult, unspecified obesity type ([place]) Deteriorated, reports that he had put on weight over the last year previously.  Is trying to exercise and do better by himself.  He is being seen at the [facility].  Reports overall doing well and trying his best to get his weight back on track and monitor his other health conditions.  - COMPLETE METABOLIC PANEL WITH GFR - CBC - Lipid panel - TSH  2. Type 2 diabetes mellitus with hyperglycemia, without long-term current use of insulin (HCC) A1c stable at 6.4% continue taking medications as directed.  Will be checking labs and A1c in future.  Additionally check a microalbumin level.  Encouraged to maintain his statin medication and educated on diet control.  - COMPLETE METABOLIC PANEL WITH GFR - CBC - Hemoglobin A1c - Microalbumin / creatinine urine ratio  3. Hyperlipidemia, unspecified hyperlipidemia type Was taking low-dose Crestor.  Cholesterol panel is elevated in addition to triglycerides. Encouraged to start back on statin.  Prescribed Lipitor.  Will be checking his cholesterol levels and blood levels.  - COMPLETE METABOLIC PANEL WITH GFR - CBC - Lipid panel  4.  Erectile dysfunction, unspecified erectile dysfunction type History of erectile dysfunction, it was not on anything at the time when we first met. Currently is wanting to be back on Viagra will start low-dose to see how everything goes. Reviewed side effects, risks and benefits of medication.   Patient acknowledged agreement and understanding of the plan.  Additional check of PSA for annual visit next year  - PSA - sildenafil (VIAGRA) 25 MG tablet; Take 1-2 tablets (25-50 mg total) by mouth daily as needed for erectile dysfunction.  Dispense: 20 tablet; Refill: 0  5. Gastric bypass status for obesity History of gastric bypass for obesity.  Secondary to the bypass needs to have vitamin be taken will be checking levels to make sure that he is still properly absorbing.  - Vitamin B12  6. Vitamin D deficiency Currently on prescription strength dose vitamin D.  Advised to continue this. Previous office visits with the weight management clinic provided education on vitamin D levels contributing to fatigue and are associated with obesity, breast and colon cancer.  - VITAMIN D 25 Hydroxy    TIME:  Today, I have spent 15 minutes with the patient with telehealth technology discussing the above problems.     Medication Adjustments/Labs and Tests Ordered: Current medicines are reviewed at length with the patient today.  Concerns regarding medicines are outlined above.   Tests Ordered: Orders Placed This Encounter  Procedures  . COMPLETE METABOLIC PANEL WITH GFR  . CBC  . Hemoglobin A1c  . Lipid panel  . VITAMIN D 25 Hydroxy  . TSH  . Vitamin B12  . Microalbumin / creatinine urine ratio  . PSA    Medication Changes: Meds ordered this encounter  Medications  . DISCONTD: sildenafil (VIAGRA) 25 MG tablet    Sig: Take 1-2 tablets (25-50 mg total) by mouth daily as needed for erectile dysfunction.    Dispense:  20 tablet    Refill:  0    Order Specific Question:   Supervising Provider     Answer:   SIMPSON, MARGARET E [[id number]]  . sildenafil (VIAGRA) 25 MG tablet    Sig: Take 1-2 tablets (25-50 mg total)  by mouth daily as needed for erectile dysfunction.    Dispense:  20 tablet    Refill:  0    Order Specific Question:   Supervising Provider    Answer:   Fayrene Helper [[id number]]    Disposition:  Follow up June for annual with vision  Signed, Perlie Mayo, NP  04/19/2019 10:19 AM     [place]

## 2019-04-24 ENCOUNTER — Ambulatory Visit (INDEPENDENT_AMBULATORY_CARE_PROVIDER_SITE_OTHER): Payer: Medicaid Other | Admitting: Family Medicine

## 2019-04-24 ENCOUNTER — Encounter (INDEPENDENT_AMBULATORY_CARE_PROVIDER_SITE_OTHER): Payer: Self-pay | Admitting: Family Medicine

## 2019-04-24 ENCOUNTER — Ambulatory Visit (INDEPENDENT_AMBULATORY_CARE_PROVIDER_SITE_OTHER): Payer: Medicaid Other

## 2019-04-24 ENCOUNTER — Other Ambulatory Visit: Payer: Self-pay

## 2019-04-24 VITALS — BP 139/75 | HR 78 | Temp 98.0°F | Ht 67.0 in | Wt 284.0 lb

## 2019-04-24 DIAGNOSIS — N183 Chronic kidney disease, stage 3 unspecified: Secondary | ICD-10-CM

## 2019-04-24 DIAGNOSIS — Z6841 Body Mass Index (BMI) 40.0 and over, adult: Secondary | ICD-10-CM | POA: Diagnosis not present

## 2019-04-24 DIAGNOSIS — Z9189 Other specified personal risk factors, not elsewhere classified: Secondary | ICD-10-CM | POA: Diagnosis not present

## 2019-04-24 DIAGNOSIS — M160 Bilateral primary osteoarthritis of hip: Secondary | ICD-10-CM | POA: Diagnosis not present

## 2019-04-24 DIAGNOSIS — F3289 Other specified depressive episodes: Secondary | ICD-10-CM | POA: Diagnosis not present

## 2019-04-24 DIAGNOSIS — E119 Type 2 diabetes mellitus without complications: Secondary | ICD-10-CM | POA: Diagnosis not present

## 2019-04-24 DIAGNOSIS — E559 Vitamin D deficiency, unspecified: Secondary | ICD-10-CM | POA: Diagnosis not present

## 2019-04-24 DIAGNOSIS — Z23 Encounter for immunization: Secondary | ICD-10-CM | POA: Diagnosis not present

## 2019-04-24 DIAGNOSIS — Z9884 Bariatric surgery status: Secondary | ICD-10-CM | POA: Diagnosis not present

## 2019-04-24 MED ORDER — RYBELSUS 3 MG PO TABS: 3.0000 mg | ORAL_TABLET | Freq: Every day | ORAL | 0 refills | Status: DC

## 2019-04-24 MED ORDER — VITAMIN D (ERGOCALCIFEROL) 1.25 MG (50000 UNIT) PO CAPS: 50000.0000 [IU] | ORAL_CAPSULE | ORAL | 0 refills | Status: DC

## 2019-04-24 NOTE — Progress Notes (Signed)
Office: [phone]  /  Fax: [phone]   HPI:   Chief Complaint: OBESITY [name] is here to discuss his progress with his obesity treatment plan. He is on the keep a food journal with 1200-1250 calories and 80-90 grams of protein daily and is following his eating plan approximately 70-80 % of the time. He states he is bike riding for 20 minutes 3-4 times per week and weight lifting 30-40 minutes 3 times per week. [name] is frustrated with the lack of weight loss. He needs hip surgery and his goal weight is 260 lbs. He started Wellbutrin last month and feels that this is helping.  His weight is 284 lb (128.8 kg) today and has gained 2 lbs since his last visit. He has lost 0 lbs since starting treatment with [name].  Diabetes II [name] has a diagnosis of diabetes type II. [name] is not a candidate for metformin due to chronic kidney disease. He would like to benefit from GLP1 RA. Last a1c was 6.4 on 04/13/2019. He denies hypoglycemia. He has been working on intensive lifestyle modifications including diet, exercise, and weight loss to help control his blood glucose levels.  Vitamin D Deficiency [name] has a diagnosis of vitamin D deficiency. He is currently taking prescription Vit D. Last Vit D level was 27 on 04/13/2019. She denies nausea, vomiting or muscle weakness.  At risk for osteopenia and osteoporosis [name] is at higher risk of osteopenia and osteoporosis due to vitamin D deficiency.   History of Gastric Bypass [name] has a history of gastric bypass in 2010.   Chronic Kidney Disease [name] has a diagnosis of chronic kidney disease. He is stable.  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 His goal is to have surgery with [facility].  Depression with Emotional Eating Behaviors [name] is tolerating wellbutrin, he noted initial irritability but better now. [name] struggles with emotional eating and using food for comfort to the extent that it is negatively impacting his  health. He often snacks when he is not hungry. [name] sometimes feels he is out of control and then feels guilty that he made poor food choices. He has been working on behavior modification techniques to help reduce his emotional eating and has been somewhat successful. He shows no sign of suicidal or homicidal ideations.  Depression screen [facility] 2/9 04/19/2019 02/15/2019 11/17/2018 08/01/2018 07/25/2018  Decreased Interest 0 1 0 2 1  Down, Depressed, Hopeless 0 1 0 2 1  PHQ - 2 Score 0 2 0 4 2  Altered sleeping - 2 - 1 1  Tired, decreased energy - 2 - 1 1  Change in appetite - 2 - 1 1  Feeling bad or failure about yourself  - 2 - 1 1  Trouble concentrating - 1 - 0 1  Moving slowly or fidgety/restless - 0 - 1 0  Suicidal thoughts - 0 - 0 0  PHQ-9 Score - 11 - 9 7  Difficult doing work/chores - Somewhat difficult - Not difficult at all Not difficult at all    ASSESSMENT AND PLAN:  Vitamin D deficiency - Plan: Vitamin D, Ergocalciferol, (DRISDOL) 1.25 MG (50000 UT) CAPS capsule  Stage 3 chronic kidney disease, unspecified whether stage 3a or 3b CKD  Gastric bypass status for obesity  Osteoarthritis of both hips, unspecified osteoarthritis type  Type 2 diabetes mellitus without complication, without long-term current use of insulin (HCC) - Plan: Semaglutide (RYBELSUS) 3 MG TABS  Other depression, emotional eating  At risk  for osteoporosis  Class 3 severe obesity with serious comorbidity and body mass index (BMI) of 40.0 to 44.9 in adult, unspecified obesity type ([place])  PLAN:  Diabetes II Walt has been given extensive diabetes education by myself today including ideal fasting and post-prandial blood glucose readings, individual ideal Hgb A1c goals and hypoglycemia prevention. We discussed the importance of good blood sugar control to decrease the likelihood of diabetic complications such as nephropathy, neuropathy, limb loss, blindness, coronary artery disease, and death. We discussed the  importance of intensive lifestyle modification including diet, exercise and weight loss as the first line treatment for diabetes. Kavaughn agrees to start Rybelsus 3 mg PO daily #30 with no refills. Pritesh agrees to follow up with our clinic in 2 weeks.  Vitamin D Deficiency [name] was informed that low vitamin D levels contributes to fatigue and are associated with obesity, breast, and colon cancer. [name] agrees to continue taking prescription Vit D 50,000 IU every week #4 and we will refill for 1 month. He will follow up for routine testing of vitamin D, at least 2-3 times per year. He was informed of the risk of over-replacement of vitamin D and agrees to not increase his dose unless he discusses this with [name] first. Demarrius agrees to follow up with our clinic in 2 weeks.  At risk for osteopenia and osteoporosis [name] was given extended (15 minutes) osteoporosis prevention counseling today. Sayer is at risk for osteopenia and osteoporsis due to his vitamin D deficiency. He was encouraged to take his vitamin D and follow his higher calcium diet and increase strengthening exercise to help strengthen his bones and decrease his risk of osteopenia and osteoporosis.  History of Gastric Bypass [name] is to reconsider bariatric multivitamins. We will continue to monitor.  Chronic Kidney Disease [name] is to avoid nephrotoxic medications. We will continue to monitor.  Osteoarthritis in Both Hips [name] is to reconsider physical therapy and water exercises. We will continue to monitor.  Depression with Emotional Eating Behaviors We discussed behavior modification techniques today to help [name] deal with his emotional eating behaviors. [name] agrees to continue taking Wellbutrin at current dose for now. [name] agrees to follow up with our clinic in 2 weeks.  Obesity [name] is currently in the action stage of change. As such, his goal is to continue with weight loss efforts He has agreed to keep a food journal with 1200-1250  calories and 80-90 grams of protein daily [name] has been instructed to work up to a goal of 150 minutes of combined cardio and strengthening exercise per week for weight loss and overall health benefits. We discussed the following Behavioral Modification Strategies today: increasing lean protein intake, decreasing simple carbohydrates, increasing vegetables, increase H20 intake, work on meal planning and easy cooking plans and emotional eating strategies   [name] has agreed to follow up with our clinic in 2 weeks. He was informed of the importance of frequent follow up visits to maximize his success with intensive lifestyle modifications for his multiple health conditions.  ALLERGIES: No Known Allergies  MEDICATIONS: Current Outpatient Medications on File Prior to Visit  Medication Sig Dispense Refill  . acetaminophen (TYLENOL) 500 MG tablet Take 1,000 mg by mouth every 6 (six) hours as needed for moderate pain.    Marland Kitchen atorvastatin (LIPITOR) 10 MG tablet Take 1 tablet (10 mg total) by mouth daily. 30 tablet 1  . blood glucose meter kit and supplies KIT Test daily Dx  r73.03 1 each 0  .  buPROPion (WELLBUTRIN SR) 150 MG 12 hr tablet Take 1 tablet (150 mg total) by mouth daily. 30 tablet 0  . Cyanocobalamin (VITAMIN B-12 PO) Take by mouth.    . latanoprost (XALATAN) 0.005 % ophthalmic solution Place 1 drop into both eyes at bedtime.    . Multiple Vitamins-Minerals (MULTIVITAMIN WITH MINERALS) tablet Take 1 tablet by mouth daily.    . Oxycodone HCl 20 MG TABS Take 1 tablet (20 mg total) by mouth 3 (three) times daily as needed. 30 tablet 0  . sildenafil (VIAGRA) 25 MG tablet Take 1-2 tablets (25-50 mg total) by mouth daily as needed for erectile dysfunction. 20 tablet 0  . timolol (BETIMOL) 0.5 % ophthalmic solution Apply 1 drop to eye daily.    Marland Kitchen UNABLE TO FIND 1 pair diabetic shoes 3 pairs of inserts Dx : r73.03 1 each 0   No current facility-administered medications on file prior to visit.      PAST MEDICAL HISTORY: Past Medical History:  Diagnosis Date  . Anemia   . Arthritis    "hips" (01/15/2015)  . Blood transfusion without reported diagnosis   . Chronic lower back pain   . Colon polyps   . Depression    denies  . Diabetes mellitus    diet controlled- no meds since wt loss  . Dysrhythmia    s/p surgery bariatric- cardioversion  . GERD (gastroesophageal reflux disease)   . Hip pain   . History of gout   . Hyperlipidemia   . Hypertension   . Joint pain   . Neuromuscular disorder ([place])   . PONV (postoperative nausea and vomiting)   . Pre-diabetes   . Sleep apnea    no longer have to use cpap since wt loss  . Stage 3 chronic kidney disease 01/26/2018  . Stroke [facility]) 2006   denies residual on 01/15/2015    PAST SURGICAL HISTORY: Past Surgical History:  Procedure Laterality Date  . BARIATRIC SURGERY  2010   in [place]  . CARDIOVERSION    . COLONOSCOPY N/A 08/02/2014   Procedure: COLONOSCOPY;  Surgeon: Rogene Houston, MD;  [facility];  Service: Endoscopy;  Laterality: N/A;  210 - moved to 3/10 @ 11:55 - Ann notified pt  . ESOPHAGOGASTRODUODENOSCOPY    . HERNIA REPAIR    . LIPOSUCTION    . PANNICULECTOMY  01/14/2015  . PANNICULECTOMY N/A 01/14/2015   Procedure:  TOTAL PANNICULECTOMY WTH LIPOSUCTION OF SIDES;  Surgeon: Cristine Polio, MD;  Location: [place];  Service: Plastics;  Laterality: N/A;  . TONSILLECTOMY    . VENTRAL HERNIA REPAIR  01/14/2015  . VENTRAL HERNIA REPAIR N/A 01/14/2015   Procedure: HERNIA REPAIR VENTRAL ADULT AND MUSCLE REPAIR;  Surgeon: Cristine Polio, MD;  Location: [place];  Service: Plastics;  Laterality: N/A;    SOCIAL HISTORY: Social History   Tobacco Use  . Smoking status: Never Smoker  . Smokeless tobacco: Never Used  Substance Use Topics  . Alcohol use: No  . Drug use: No    FAMILY HISTORY: Family History  Problem Relation Age of Onset  . COPD Mother   . Depression Mother   . Hyperlipidemia Mother   .  Hypertension Mother   . Heart disease Mother   . Thyroid disease Mother   . Anxiety disorder Mother   . Diabetes Father   . Pulmonary embolism Father 38       blood clot  . Hyperlipidemia Father   . Sudden death Father   .  Obesity Father   . Eating disorder Father   . Hypertension Brother     ROS: Review of Systems  Constitutional: Negative for weight loss.  Gastrointestinal: Negative for nausea and vomiting.  Musculoskeletal:       Negative muscle weakness + Hip pain  Endo/Heme/Allergies:       Negative hypoglycemia  Psychiatric/Behavioral: Positive for depression. Negative for suicidal ideas.    PHYSICAL EXAM: Blood pressure 139/75, pulse 78, temperature 98 F (36.7 C), temperature source Oral, height '5\' 7"'  (1.702 m), weight 284 lb (128.8 kg), SpO2 98 %. Body mass index is 44.48 kg/m. Physical Exam Vitals signs reviewed.  Constitutional:      Appearance: Normal appearance. He is obese.  Cardiovascular:     Rate and Rhythm: Normal rate.     Pulses: Normal pulses.  Pulmonary:     Effort: Pulmonary effort is normal.     Breath sounds: Normal breath sounds.  Musculoskeletal: Normal range of motion.  Skin:    General: Skin is warm and dry.  Neurological:     Mental Status: He is alert and oriented to person, place, and time.  Psychiatric:        Mood and Affect: Mood normal.        Behavior: Behavior normal.     RECENT LABS AND TESTS: BMET    Component Value Date/Time   NA 138 04/13/2019 1026   NA 139 02/15/2019 1050   K 4.6 04/13/2019 1026   CL 103 04/13/2019 1026   CO2 24 04/13/2019 1026   GLUCOSE 152 (H) 04/13/2019 1026   BUN 29 (H) 04/13/2019 1026   BUN 19 02/15/2019 1050   CREATININE 1.66 (H) 04/13/2019 1026   CALCIUM 9.5 04/13/2019 1026   GFRNONAA 48 (L) 04/13/2019 1026   GFRAA 55 (L) 04/13/2019 1026   Lab Results  Component Value Date   HGBA1C 6.4 (H) 04/13/2019   HGBA1C 6.4 (H) 02/15/2019   HGBA1C 6.6 (A) 06/09/2018   HGBA1C 6.5 (A)  01/26/2018   HGBA1C 6.0 (H) 02/11/2017   Lab Results  Component Value Date   INSULIN 17.7 02/15/2019   CBC    Component Value Date/Time   WBC 8.4 08/26/2018 1237   WBC 21.8 (H) 07/25/2018 1707   RBC 4.58 08/26/2018 1237   RBC 4.61 07/25/2018 1707   HGB 13.6 08/26/2018 1237   HCT 38.9 08/26/2018 1237   PLT 262 08/26/2018 1237   MCV 85 08/26/2018 1237   MCH 29.7 08/26/2018 1237   MCH 27.3 07/25/2018 1707   MCHC 35.0 08/26/2018 1237   MCHC 32.2 07/25/2018 1707   RDW 17.8 (H) 08/26/2018 1237   LYMPHSABS 2.2 08/26/2018 1237   MONOABS 0.7 02/20/2016 0127   EOSABS 0.2 08/26/2018 1237   BASOSABS 0.1 08/26/2018 1237   Iron/TIBC/Ferritin/ %Sat    Component Value Date/Time   IRON 88 06/09/2018 0959   TIBC 366 06/09/2018 0959   FERRITIN 15 (L) 06/09/2018 0959   IRONPCTSAT 24 06/09/2018 0959   Lipid Panel     Component Value Date/Time   CHOL 223 (H) 04/13/2019 1026   CHOL 211 (H) 02/15/2019 1050   TRIG 338 (H) 04/13/2019 1026   HDL 34 (L) 04/13/2019 1026   HDL 37 (L) 02/15/2019 1050   CHOLHDL 6.6 (H) 04/13/2019 1026   LDLCALC 141 (H) 04/13/2019 1026   Hepatic Function Panel     Component Value Date/Time   PROT 7.3 04/13/2019 1026   PROT 7.0 02/15/2019 1050   ALBUMIN 3.8 (L)  02/15/2019 1050   AST 22 04/13/2019 1026   ALT 22 04/13/2019 1026   ALKPHOS 124 (H) 02/15/2019 1050   BILITOT 0.8 04/13/2019 1026   BILITOT 0.8 02/15/2019 1050      Component Value Date/Time   TSH 1.81 04/13/2019 1026   TSH 2.200 02/15/2019 1050   TSH 1.250 01/26/2018 1011      OBESITY BEHAVIORAL INTERVENTION VISIT  Today's visit was # 4   Starting weight: 282 lbs Starting date: 02/15/2019 Today's weight : 284 lbs  Today's date: 04/24/2019 Total lbs lost to date: 0    ASK: We discussed the diagnosis of obesity with [name] today and Thorsten agreed to give [name] permission to discuss obesity behavioral modification therapy today.  ASSESS: Gared has the diagnosis of obesity and  his BMI today is 44.47 Garnett is in the action stage of change   ADVISE: Keilen was educated on the multiple health risks of obesity as well as the benefit of weight loss to improve his health. He was advised of the need for long term treatment and the importance of lifestyle modifications to improve his current health and to decrease his risk of future health problems.  AGREE: Multiple dietary modification options and treatment options were discussed and  Valor agreed to follow the recommendations documented in the above note.  ARRANGE: Teagen was educated on the importance of frequent visits to treat obesity as outlined per CMS and USPSTF guidelines and agreed to schedule his next follow up appointment today.  Wilhemena Durie, am acting as transcriptionist for [name], DO  I have reviewed the above documentation for accuracy and completeness, and I agree with the above. [name], DO

## 2019-04-25 ENCOUNTER — Encounter (INDEPENDENT_AMBULATORY_CARE_PROVIDER_SITE_OTHER): Payer: Self-pay | Admitting: Family Medicine

## 2019-05-01 ENCOUNTER — Other Ambulatory Visit (INDEPENDENT_AMBULATORY_CARE_PROVIDER_SITE_OTHER): Payer: Self-pay

## 2019-05-01 ENCOUNTER — Encounter: Payer: Self-pay | Admitting: Family Medicine

## 2019-05-01 ENCOUNTER — Encounter (INDEPENDENT_AMBULATORY_CARE_PROVIDER_SITE_OTHER): Payer: Self-pay | Admitting: Bariatrics

## 2019-05-01 DIAGNOSIS — E119 Type 2 diabetes mellitus without complications: Secondary | ICD-10-CM

## 2019-05-01 MED ORDER — INSULIN PEN NEEDLE 32G X 4 MM MISC: 1.0000 | Freq: Once | 0 refills | Status: DC

## 2019-05-01 MED ORDER — LIRAGLUTIDE 18 MG/3ML ~~LOC~~ SOPN: 0.6000 mg | PEN_INJECTOR | Freq: Every day | SUBCUTANEOUS | 0 refills | Status: DC

## 2019-05-01 NOTE — Telephone Encounter (Signed)
Please advise,  MCD prefers Victoza instead of Rybelsus.  His next appt is 05/08/2019 with Dr Owens Shark.

## 2019-05-01 NOTE — Telephone Encounter (Signed)
Please advise 

## 2019-05-02 ENCOUNTER — Other Ambulatory Visit: Payer: Self-pay | Admitting: Family Medicine

## 2019-05-02 DIAGNOSIS — M160 Bilateral primary osteoarthritis of hip: Secondary | ICD-10-CM

## 2019-05-02 MED ORDER — NAPROXEN 500 MG PO TABS: 500.0000 mg | ORAL_TABLET | Freq: Every day | ORAL | 0 refills | Status: DC

## 2019-05-03 ENCOUNTER — Encounter (INDEPENDENT_AMBULATORY_CARE_PROVIDER_SITE_OTHER): Payer: Self-pay | Admitting: Bariatrics

## 2019-05-08 ENCOUNTER — Ambulatory Visit (INDEPENDENT_AMBULATORY_CARE_PROVIDER_SITE_OTHER): Payer: Medicaid Other | Admitting: Bariatrics

## 2019-05-08 ENCOUNTER — Encounter (INDEPENDENT_AMBULATORY_CARE_PROVIDER_SITE_OTHER): Payer: Self-pay | Admitting: Bariatrics

## 2019-05-08 ENCOUNTER — Other Ambulatory Visit: Payer: Self-pay

## 2019-05-08 VITALS — BP 142/84 | HR 66 | Temp 97.8°F | Ht 67.0 in | Wt 288.0 lb

## 2019-05-08 DIAGNOSIS — Z6841 Body Mass Index (BMI) 40.0 and over, adult: Secondary | ICD-10-CM | POA: Diagnosis not present

## 2019-05-08 DIAGNOSIS — E559 Vitamin D deficiency, unspecified: Secondary | ICD-10-CM

## 2019-05-08 DIAGNOSIS — F3289 Other specified depressive episodes: Secondary | ICD-10-CM

## 2019-05-08 MED ORDER — VITAMIN D (ERGOCALCIFEROL) 1.25 MG (50000 UNIT) PO CAPS: 50000.0000 [IU] | ORAL_CAPSULE | ORAL | 0 refills | Status: DC

## 2019-05-08 MED ORDER — BUPROPION HCL ER (SR) 150 MG PO TB12: 150.0000 mg | ORAL_TABLET | Freq: Every day | ORAL | 0 refills | Status: DC

## 2019-05-08 NOTE — Progress Notes (Signed)
Office: [phone]  /  Fax: [phone]   HPI:  Chief Complaint: OBESITY [name] is here to discuss his progress with his obesity treatment plan. He is  keeping a food journal with 1200-1500 calories and 80-90 grams of protein. He states he is doing cardio 30 minutes 3 times per week and weights 40 minutes 3 times per week.  [name] is up 4 lbs. He needs to get his weight down to 260 lbs.  Today's visit was #5 Starting weight: 282 lbs Starting date: 02/15/2019 Today's weight: 288 lbs  Today's date: 05/08/2019 Total lbs lost to date: 0 Total lbs lost since last in-office visit: 0  Other Depression (Emotional Eating Behaviors) [name] reports having decreased appetite and decreased cravings.  Vitamin D deficiency [name] has a diagnosis of Vitamin D deficiency with his last Vitamin D level being 27 on 04/13/2019. He denies nausea, vomiting, or muscle weakness.  ASSESSMENT AND PLAN:  Vitamin D deficiency - Plan: Vitamin D, Ergocalciferol, (DRISDOL) 1.25 MG (50000 UT) CAPS capsule  Other depression - with emotional eating  - Plan: buPROPion (WELLBUTRIN SR) 150 MG 12 hr tablet  Class 3 severe obesity with serious comorbidity and body mass index (BMI) of 45.0 to 49.9 in adult, unspecified obesity type ([place])  PLAN:  Emotional Eating Behaviors (other depression) Behavior modification techniques were discussed today to help [name] deal with his emotional/non-hunger eating behaviors. [name] was given a prescription for Wellbutrin 150 mg 1 PO daily #30 with 0 refills and agrees to follow-up with our clinic in 3 weeks. Will continue to follow and monitor his progress.  Vitamin D Deficiency [name] was informed that low Vitamin D levels contributes to fatigue and are associated with obesity, breast, and colon cancer. He agrees to continue to take prescription Vit D @ 50,000 IU every week #4 with 0 refills and will follow-up for routine testing of Vitamin D, at least 2-3 times per year. He was informed of  the risk of over-replacement of Vitamin D and agrees to not increase his dose unless he discusses this with [name] first. [name] agrees to follow-up with our clinic in 3 weeks.  Obesity [name] is currently in the action stage of change. As such, his goal is to continue with weight loss efforts. He has agreed to change plans and will now follow the Category 2 plan. [name] will work on meal planning. He will switch to the Category 2 plan and will journal on some days. He will increase his water intake. [name] has been instructed to continue cardio and weights for weight loss and overall health benefits. We discussed the following Behavioral Modification Strategies today: increasing lean protein intake, decreasing simple carbohydrates, increasing vegetables, increase H20 intake, decrease eating out, no skipping meals, work on meal planning and easy cooking plans, keeping healthy foods in the home, and planning for success.  [name] has agreed to follow-up with our clinic in 3 weeks. He was informed of the importance of frequent follow-up visits to maximize his success with intensive lifestyle modifications for his multiple health conditions.  ALLERGIES: No Known Allergies  MEDICATIONS: Current Outpatient Medications on File Prior to Visit  Medication Sig Dispense Refill  . acetaminophen (TYLENOL) 500 MG tablet Take 1,000 mg by mouth every 6 (six) hours as needed for moderate pain.    Marland Kitchen atorvastatin (LIPITOR) 10 MG tablet Take 1 tablet (10 mg total) by mouth daily. 30 tablet 1  . blood glucose meter kit and supplies KIT Test daily Dx  r73.03 1  each 0  . Cyanocobalamin (VITAMIN B-12 PO) Take by mouth.    . latanoprost (XALATAN) 0.005 % ophthalmic solution Place 1 drop into both eyes at bedtime.    . liraglutide (VICTOZA) 18 MG/3ML SOPN Inject 0.1 mLs (0.6 mg total) into the skin daily. 1 pen 0  . Multiple Vitamins-Minerals (MULTIVITAMIN WITH MINERALS) tablet Take 1 tablet by mouth daily.    . naproxen  (NAPROSYN) 500 MG tablet Take 1 tablet (500 mg total) by mouth daily. 30 tablet 0  . Oxycodone HCl 20 MG TABS Take 1 tablet (20 mg total) by mouth 3 (three) times daily as needed. 30 tablet 0  . sildenafil (VIAGRA) 25 MG tablet Take 1-2 tablets (25-50 mg total) by mouth daily as needed for erectile dysfunction. 20 tablet 0  . timolol (BETIMOL) 0.5 % ophthalmic solution Apply 1 drop to eye daily.    Marland Kitchen UNABLE TO FIND 1 pair diabetic shoes 3 pairs of inserts Dx : r73.03 1 each 0   No current facility-administered medications on file prior to visit.    PAST MEDICAL HISTORY: Past Medical History:  Diagnosis Date  . Anemia   . Arthritis    "hips" (01/15/2015)  . Blood transfusion without reported diagnosis   . Chronic lower back pain   . Colon polyps   . Depression    denies  . Diabetes mellitus    diet controlled- no meds since wt loss  . Dysrhythmia    s/p surgery bariatric- cardioversion  . GERD (gastroesophageal reflux disease)   . Hip pain   . History of gout   . Hyperlipidemia   . Hypertension   . Joint pain   . Neuromuscular disorder ([place])   . PONV (postoperative nausea and vomiting)   . Pre-diabetes   . Sleep apnea    no longer have to use cpap since wt loss  . Stage 3 chronic kidney disease 01/26/2018  . Stroke [facility]) 2006   denies residual on 01/15/2015    PAST SURGICAL HISTORY: Past Surgical History:  Procedure Laterality Date  . BARIATRIC SURGERY  2010   in [place]  . CARDIOVERSION    . COLONOSCOPY N/A 08/02/2014   Procedure: COLONOSCOPY;  Surgeon: Rogene Houston, MD;  Location[facility];  Service: Endoscopy;  Laterality: N/A;  210 - moved to 3/10 @ 11:55 - Ann notified pt  . ESOPHAGOGASTRODUODENOSCOPY    . HERNIA REPAIR    . LIPOSUCTION    . PANNICULECTOMY  01/14/2015  . PANNICULECTOMY N/A 01/14/2015   Procedure:  TOTAL PANNICULECTOMY WTH LIPOSUCTION OF SIDES;  Surgeon: Cristine Polio, MD;  Location: [place];  Service: Plastics;  Laterality: N/A;  .  TONSILLECTOMY    . VENTRAL HERNIA REPAIR  01/14/2015  . VENTRAL HERNIA REPAIR N/A 01/14/2015   Procedure: HERNIA REPAIR VENTRAL ADULT AND MUSCLE REPAIR;  Surgeon: Cristine Polio, MD;  Location: [place];  Service: Plastics;  Laterality: N/A;    SOCIAL HISTORY: Social History   Tobacco Use  . Smoking status: Never Smoker  . Smokeless tobacco: Never Used  Substance Use Topics  . Alcohol use: No  . Drug use: No    FAMILY HISTORY: Family History  Problem Relation Age of Onset  . COPD Mother   . Depression Mother   . Hyperlipidemia Mother   . Hypertension Mother   . Heart disease Mother   . Thyroid disease Mother   . Anxiety disorder Mother   . Diabetes Father   . Pulmonary embolism Father  56       blood clot  . Hyperlipidemia Father   . Sudden death Father   . Obesity Father   . Eating disorder Father   . Hypertension Brother    ROS: Review of Systems  Gastrointestinal: Negative for nausea and vomiting.  Musculoskeletal:       Negative for muscle weakness.  Psychiatric/Behavioral: Positive for depression (emotional eating).   PHYSICAL EXAM: Blood pressure (!) 142/84, pulse 66, temperature 97.8 F (36.6 C), height '5\' 7"'  (1.702 m), weight 288 lb (130.6 kg), SpO2 99 %. Body mass index is 45.11 kg/m. Physical Exam Vitals reviewed.  Constitutional:      Appearance: Normal appearance. He is obese.  Cardiovascular:     Rate and Rhythm: Normal rate.     Pulses: Normal pulses.  Pulmonary:     Effort: Pulmonary effort is normal.     Breath sounds: Normal breath sounds.  Musculoskeletal:        General: Normal range of motion.  Skin:    General: Skin is warm and dry.  Neurological:     Mental Status: He is alert and oriented to person, place, and time.  Psychiatric:        Behavior: Behavior normal.   RECENT LABS AND TESTS: BMET    Component Value Date/Time   NA 138 04/13/2019 1026   NA 139 02/15/2019 1050   K 4.6 04/13/2019 1026   CL 103 04/13/2019 1026   CO2  24 04/13/2019 1026   GLUCOSE 152 (H) 04/13/2019 1026   BUN 29 (H) 04/13/2019 1026   BUN 19 02/15/2019 1050   CREATININE 1.66 (H) 04/13/2019 1026   CALCIUM 9.5 04/13/2019 1026   GFRNONAA 48 (L) 04/13/2019 1026   GFRAA 55 (L) 04/13/2019 1026   Lab Results  Component Value Date   HGBA1C 6.4 (H) 04/13/2019   HGBA1C 6.4 (H) 02/15/2019   HGBA1C 6.6 (A) 06/09/2018   HGBA1C 6.5 (A) 01/26/2018   HGBA1C 6.0 (H) 02/11/2017   Lab Results  Component Value Date   INSULIN 17.7 02/15/2019   CBC    Component Value Date/Time   WBC 8.4 08/26/2018 1237   WBC 21.8 (H) 07/25/2018 1707   RBC 4.58 08/26/2018 1237   RBC 4.61 07/25/2018 1707   HGB 13.6 08/26/2018 1237   HCT 38.9 08/26/2018 1237   PLT 262 08/26/2018 1237   MCV 85 08/26/2018 1237   MCH 29.7 08/26/2018 1237   MCH 27.3 07/25/2018 1707   MCHC 35.0 08/26/2018 1237   MCHC 32.2 07/25/2018 1707   RDW 17.8 (H) 08/26/2018 1237   LYMPHSABS 2.2 08/26/2018 1237   MONOABS 0.7 02/20/2016 0127   EOSABS 0.2 08/26/2018 1237   BASOSABS 0.1 08/26/2018 1237   Iron/TIBC/Ferritin/ %Sat    Component Value Date/Time   IRON 88 06/09/2018 0959   TIBC 366 06/09/2018 0959   FERRITIN 15 (L) 06/09/2018 0959   IRONPCTSAT 24 06/09/2018 0959   Lipid Panel     Component Value Date/Time   CHOL 223 (H) 04/13/2019 1026   CHOL 211 (H) 02/15/2019 1050   TRIG 338 (H) 04/13/2019 1026   HDL 34 (L) 04/13/2019 1026   HDL 37 (L) 02/15/2019 1050   CHOLHDL 6.6 (H) 04/13/2019 1026   LDLCALC 141 (H) 04/13/2019 1026   Hepatic Function Panel     Component Value Date/Time   PROT 7.3 04/13/2019 1026   PROT 7.0 02/15/2019 1050   ALBUMIN 3.8 (L) 02/15/2019 1050   AST 22 04/13/2019 1026  ALT 22 04/13/2019 1026   ALKPHOS 124 (H) 02/15/2019 1050   BILITOT 0.8 04/13/2019 1026   BILITOT 0.8 02/15/2019 1050      Component Value Date/Time   TSH 1.81 04/13/2019 1026   TSH 2.200 02/15/2019 1050   TSH 1.250 01/26/2018 1011    OBESITY BEHAVIORAL INTERVENTION  VISIT DOCUMENTATION FOR INSURANCE (~15 minutes)  I, Michaelene Song, am acting as Location manager for [employer], DO

## 2019-05-09 ENCOUNTER — Encounter: Payer: Self-pay | Admitting: Family Medicine

## 2019-05-15 DIAGNOSIS — G894 Chronic pain syndrome: Secondary | ICD-10-CM | POA: Diagnosis not present

## 2019-05-15 DIAGNOSIS — M79652 Pain in left thigh: Secondary | ICD-10-CM | POA: Diagnosis not present

## 2019-05-15 DIAGNOSIS — M545 Low back pain: Secondary | ICD-10-CM | POA: Diagnosis not present

## 2019-05-15 DIAGNOSIS — M25559 Pain in unspecified hip: Secondary | ICD-10-CM | POA: Diagnosis not present

## 2019-05-15 DIAGNOSIS — Z79891 Long term (current) use of opiate analgesic: Secondary | ICD-10-CM | POA: Diagnosis not present

## 2019-05-24 ENCOUNTER — Other Ambulatory Visit: Payer: Self-pay

## 2019-05-24 ENCOUNTER — Other Ambulatory Visit: Payer: Self-pay | Admitting: Family Medicine

## 2019-05-24 DIAGNOSIS — N529 Male erectile dysfunction, unspecified: Secondary | ICD-10-CM

## 2019-05-24 MED ORDER — SILDENAFIL CITRATE 25 MG PO TABS: 25.0000 mg | ORAL_TABLET | Freq: Every day | ORAL | 0 refills | Status: DC | PRN

## 2019-05-29 ENCOUNTER — Other Ambulatory Visit (INDEPENDENT_AMBULATORY_CARE_PROVIDER_SITE_OTHER): Payer: Self-pay | Admitting: Bariatrics

## 2019-05-29 ENCOUNTER — Other Ambulatory Visit: Payer: Self-pay

## 2019-05-29 ENCOUNTER — Telehealth (INDEPENDENT_AMBULATORY_CARE_PROVIDER_SITE_OTHER): Payer: Medicaid Other | Admitting: Family Medicine

## 2019-05-29 ENCOUNTER — Other Ambulatory Visit: Payer: Self-pay | Admitting: Family Medicine

## 2019-05-29 ENCOUNTER — Encounter (INDEPENDENT_AMBULATORY_CARE_PROVIDER_SITE_OTHER): Payer: Self-pay | Admitting: Family Medicine

## 2019-05-29 DIAGNOSIS — M160 Bilateral primary osteoarthritis of hip: Secondary | ICD-10-CM

## 2019-05-29 DIAGNOSIS — E119 Type 2 diabetes mellitus without complications: Secondary | ICD-10-CM

## 2019-05-29 DIAGNOSIS — F3289 Other specified depressive episodes: Secondary | ICD-10-CM

## 2019-05-29 DIAGNOSIS — Z6841 Body Mass Index (BMI) 40.0 and over, adult: Secondary | ICD-10-CM | POA: Diagnosis not present

## 2019-05-29 DIAGNOSIS — E559 Vitamin D deficiency, unspecified: Secondary | ICD-10-CM

## 2019-05-29 DIAGNOSIS — Z9884 Bariatric surgery status: Secondary | ICD-10-CM | POA: Diagnosis not present

## 2019-05-29 DIAGNOSIS — N183 Chronic kidney disease, stage 3 unspecified: Secondary | ICD-10-CM | POA: Diagnosis not present

## 2019-05-29 MED ORDER — NAPROXEN 500 MG PO TABS: 500.0000 mg | ORAL_TABLET | Freq: Every day | ORAL | 0 refills | Status: DC

## 2019-05-29 MED ORDER — LIRAGLUTIDE 18 MG/3ML ~~LOC~~ SOPN: 0.6000 mg | PEN_INJECTOR | Freq: Every day | SUBCUTANEOUS | 0 refills | Status: DC

## 2019-05-29 MED ORDER — BUPROPION HCL ER (SR) 150 MG PO TB12: 150.0000 mg | ORAL_TABLET | Freq: Every day | ORAL | 0 refills | Status: DC

## 2019-05-29 MED ORDER — VITAMIN D (ERGOCALCIFEROL) 1.25 MG (50000 UNIT) PO CAPS: 50000.0000 [IU] | ORAL_CAPSULE | ORAL | 0 refills | Status: DC

## 2019-05-30 NOTE — Progress Notes (Signed)
TeleHealth Visit:  Due to the COVID-19 pandemic, this visit was completed with telemedicine (audio/video) technology to reduce patient and provider exposure as well as to preserve personal protective equipment.   [name] has verbally consented to this TeleHealth visit. The patient is located at home, the provider is located at the [facility] office. The participants in this visit include the listed provider and patient. The visit was conducted today via [email or web address].  Chief Complaint: OBESITY [name] is here to discuss his progress with his obesity treatment plan. [name] is on the Category 2 Plan and states he is following his eating plan approximately 60-70% of the time. [name] states he is riding his bike 20-30 minutes 4 times per week or walking for 60 minutes 1-2 times per week.  Today's visit was #: 6 Starting weight: 282 lbs Starting date: 02/15/2019  Subjective:   Interim History: [name] woke up today with a fever so he is having a virtual visit today.  He also endorses sneezing and fatigue.  [name] is trying to reach a goal weight of 260 lbs so that he can have orthopedic surgery.  Assessment/Plan:   1. Vitamin D deficiency Last vitimin D level was 27 on 04/13/2019.  He denies nausea, vomiting, or muscle weakness.  Would like him to continue his prescription vitamin D.  - Vitamin D, Ergocalciferol, (DRISDOL) 1.25 MG (50000 UT) CAPS capsule; Take 1 capsule (50,000 Units total) by mouth every 7 (seven) days.  Dispense: 4 capsule; Refill: 0.  He agrees to follow-up with our clinic in 2 weeks.  2. Other depression I encouraged [place] to continue his current medication.  He states he has no side effects.  - BuPROPion (WELLBUTRIN SR) 150 MG 12 hr tablet; Take 1 tablet (150 mg total) by mouth daily.  Dispense: 30 tablet; Refill: 0.  He agrees to follow-up with our clinic in 2 weeks.  3. Type 2 diabetes mellitus without complication, without long-term current use of  insulin (HCC) Last A1c was 6.4 on 04/13/2019.  He was prescribed Victoza as it is working well for him.  I will refill that today.  -Liraglutide (VICTOZA) 18 MG/3ML SOPN; Inject 0.1 mLs (0.6 mg total) into the skin daily.  Dispense: 1 pen; Refill: 0.  He agrees to follow-up with our clinic in 2 weeks.  4. Stage 3 chronic kidney disease, unspecified whether stage 3a or 3b CKD I strongly encouraged hydration, especially while sick.  5. History of gastric bypass I have advised Luciano to take a bariatric multivitamin.  6. Osteoarthritis of both hips, unspecified osteoarthritis type [name] is followed by [name] for his and has the ultimate goal of surgery, which is why he is trying to lose the weight.  Will continue to monitor his progress.  7. Class 3 severe obesity with serious comorbidity and body mass index (BMI) of 45.0 to 49.9 in adult, unspecified obesity type [name]) [name] is currently in the action stage of change. As such, his goal is to continue with weight loss efforts. He has agreed to Category 2 Plan.   We discussed the following exercise goals today: For substantial health benefits, adults should do at least 150 minutes (2 hours and 30 minutes) a week of moderate-intensity, or 75 minutes (1 hour and 15 minutes) a week of vigorous-intensity aerobic physical activity, or an equivalent combination of moderate- and vigorous-intensity aerobic activity. Aerobic activity should be performed in episodes of at least 10 minutes, and preferably, it should  be spread throughout the week. Adults should also include muscle-strengthening activities that involve all major muscle groups on 2 or more days a week.  We discussed the following behavioral modification strategies today: increasing water intake and emotional eating strategies.  [name] has agreed to follow-up with our clinic in 2 weeks. He was informed of the importance of frequent follow-up visits to maximize his success with intensive  lifestyle modifications for his multiple health conditions.  Objective:   VITALS: Per patient if applicable, see vitals. GENERAL: Alert and in no acute distress. CARDIOPULMONARY: No increased WOB. Speaking in clear sentences.  PSYCH: Pleasant and cooperative. Speech normal rate and rhythm. Affect is appropriate. Insight and judgement are appropriate. Attention is focused, linear, and appropriate.  NEURO: Oriented as arrived to appointment on time with no prompting.   Lab Results  Component Value Date   CREATININE 1.66 (H) 04/13/2019   BUN 29 (H) 04/13/2019   NA 138 04/13/2019   K 4.6 04/13/2019   CL 103 04/13/2019   CO2 24 04/13/2019   Lab Results  Component Value Date   ALT 22 04/13/2019   AST 22 04/13/2019   ALKPHOS 124 (H) 02/15/2019   BILITOT 0.8 04/13/2019   Lab Results  Component Value Date   HGBA1C 6.4 (H) 04/13/2019   HGBA1C 6.4 (H) 02/15/2019   HGBA1C 6.6 (A) 06/09/2018   HGBA1C 6.5 (A) 01/26/2018   HGBA1C 6.0 (H) 02/11/2017   Lab Results  Component Value Date   INSULIN 17.7 02/15/2019   Lab Results  Component Value Date   TSH 1.81 04/13/2019   Lab Results  Component Value Date   CHOL 223 (H) 04/13/2019   HDL 34 (L) 04/13/2019   LDLCALC 141 (H) 04/13/2019   TRIG 338 (H) 04/13/2019   CHOLHDL 6.6 (H) 04/13/2019   Lab Results  Component Value Date   WBC 8.4 08/26/2018   HGB 13.6 08/26/2018   HCT 38.9 08/26/2018   MCV 85 08/26/2018   PLT 262 08/26/2018   Lab Results  Component Value Date   IRON 88 06/09/2018   TIBC 366 06/09/2018   FERRITIN 15 (L) 06/09/2018   Attestation Statements:   Reviewed by clinician on day of visit: allergies, medications, problem list, medical history, surgical history, family history, social history and previous encounter notes.  I, Water quality scientist, am acting as Location manager for [employer], DO.  I have reviewed the above documentation for accuracy and completeness, and I agree with the above. [name], DO

## 2019-06-02 ENCOUNTER — Encounter: Payer: Self-pay | Admitting: Family Medicine

## 2019-06-05 ENCOUNTER — Other Ambulatory Visit: Payer: Self-pay

## 2019-06-05 DIAGNOSIS — N529 Male erectile dysfunction, unspecified: Secondary | ICD-10-CM

## 2019-06-06 ENCOUNTER — Other Ambulatory Visit: Payer: Self-pay | Admitting: Family Medicine

## 2019-06-06 DIAGNOSIS — E785 Hyperlipidemia, unspecified: Secondary | ICD-10-CM

## 2019-06-06 MED ORDER — ROSUVASTATIN CALCIUM 5 MG PO TABS: 5.0000 mg | ORAL_TABLET | Freq: Every day | ORAL | 1 refills | Status: DC

## 2019-06-12 ENCOUNTER — Other Ambulatory Visit: Payer: Self-pay

## 2019-06-12 ENCOUNTER — Telehealth (INDEPENDENT_AMBULATORY_CARE_PROVIDER_SITE_OTHER): Payer: Medicaid Other | Admitting: Family Medicine

## 2019-06-12 DIAGNOSIS — E785 Hyperlipidemia, unspecified: Secondary | ICD-10-CM

## 2019-06-12 DIAGNOSIS — F3289 Other specified depressive episodes: Secondary | ICD-10-CM | POA: Diagnosis not present

## 2019-06-12 DIAGNOSIS — Z6841 Body Mass Index (BMI) 40.0 and over, adult: Secondary | ICD-10-CM

## 2019-06-12 DIAGNOSIS — N182 Chronic kidney disease, stage 2 (mild): Secondary | ICD-10-CM | POA: Diagnosis not present

## 2019-06-12 DIAGNOSIS — E119 Type 2 diabetes mellitus without complications: Secondary | ICD-10-CM | POA: Diagnosis not present

## 2019-06-12 DIAGNOSIS — M160 Bilateral primary osteoarthritis of hip: Secondary | ICD-10-CM

## 2019-06-12 DIAGNOSIS — E559 Vitamin D deficiency, unspecified: Secondary | ICD-10-CM | POA: Diagnosis not present

## 2019-06-12 DIAGNOSIS — E1169 Type 2 diabetes mellitus with other specified complication: Secondary | ICD-10-CM | POA: Diagnosis not present

## 2019-06-12 MED ORDER — LIRAGLUTIDE 18 MG/3ML ~~LOC~~ SOPN: 0.6000 mg | PEN_INJECTOR | Freq: Every day | SUBCUTANEOUS | 0 refills | Status: DC

## 2019-06-12 MED ORDER — VITAMIN D (ERGOCALCIFEROL) 1.25 MG (50000 UNIT) PO CAPS: 50000.0000 [IU] | ORAL_CAPSULE | ORAL | 0 refills | Status: DC

## 2019-06-12 NOTE — Progress Notes (Signed)
TeleHealth Visit:  Due to the COVID-19 pandemic, this visit was completed with telemedicine (audio/video) technology to reduce patient and provider exposure as well as to preserve personal protective equipment. [name] has verbally consented to this TeleHealth visit. The patient is located at home, the provider is located at the [facility] office. The participants in this visit include the listed provider and patient. The visit was conducted today via [email or web address].  Chief Complaint: OBESITY [name] is here to discuss his progress with his obesity treatment plan along with follow-up of his obesity related diagnoses. [name] is on the Category 2 Plan and states he is following his eating plan approximately 60% of the time. [name] states he is bike riding for 20-30 minutes 3 times per week.  Today's visit was #: 7 Starting weight: 282 lbs Starting date: 02/15/2019  Interim History: Home due to illness.  Subjective:   1. Type 2 diabetes mellitus without complication, without long-term current use of insulin (HCC) Medications reviewed.  Currently taking Victoza.    Lab Results  Component Value Date   HGBA1C 6.4 (H) 04/13/2019   HGBA1C 6.4 (H) 02/15/2019   HGBA1C 6.6 (A) 06/09/2018   Lab Results  Component Value Date   LDLCALC 141 (H) 04/13/2019   CREATININE 1.66 (H) 04/13/2019   2. Hyperlipidemia associated with type 2 diabetes mellitus ([place]) [name] has hyperlipidemia and has been trying to improve his cholesterol levels with intensive lifestyle modification including a low saturated fat diet, exercise and weight loss. He denies any chest pain, claudication or myalgias.  He recently changed from Crestor to Lipitor due to myalgias.  Lab Results  Component Value Date   ALT 22 04/13/2019   AST 22 04/13/2019   ALKPHOS 124 (H) 02/15/2019   BILITOT 0.8 04/13/2019   Lab Results  Component Value Date   CHOL 223 (H) 04/13/2019   HDL 34 (L) 04/13/2019   LDLCALC 141 (H) 04/13/2019   TRIG 338 (H) 04/13/2019   CHOLHDL 6.6 (H) 04/13/2019   3. Stage 2 chronic kidney disease  Lab Results  Component Value Date   CREATININE 1.66 (H) 04/13/2019   CREATININE 1.42 (H) 02/15/2019   CREATININE 1.73 (H) 08/26/2018   Lab Results  Component Value Date   CREATININE 1.66 (H) 04/13/2019   BUN 29 (H) 04/13/2019   NA 138 04/13/2019   K 4.6 04/13/2019   CL 103 04/13/2019   CO2 24 04/13/2019   4. Vitamin D deficiency Deacon's Vitamin D level was 27 on 04/13/2019. He is currently taking vit D. He denies nausea, vomiting or muscle weakness.  5. Osteoarthritis of both hips, unspecified osteoarthritis type [name]'s goal is 260 lbs for orthopedic surgery.  He is followed by [facility].  6. Other depression, with emotional eating [name] is struggling with emotional eating and using food for comfort to the extent that it is negatively impacting his health. He has been working on behavior modification techniques to help reduce his emotional eating and has been unsuccessful. He shows no sign of suicidal or homicidal ideations.  He was previously taking Wellbutrin but it caused increased anger, so he stopped.  Assessment/Plan:   1. Type 2 diabetes mellitus without complication, without long-term current use of insulin (HCC) Good blood sugar control is important to decrease the likelihood of diabetic complications such as nephropathy, neuropathy, limb loss, blindness, coronary artery disease, and death. Intensive lifestyle modification including diet, exercise and weight loss are the first line of treatment for diabetes.   -  liraglutide (VICTOZA) 18 MG/3ML SOPN; Inject 0.1 mLs (0.6 mg total) into the skin daily.  Dispense: 1 pen; Refill: 0  2. Hyperlipidemia associated with type 2 diabetes mellitus ([place]) Cardiovascular risk and specific lipid/LDL goals reviewed.  We discussed several lifestyle modifications today and Troyce will continue to work on diet, exercise and weight loss efforts. Orders  and follow up as documented in patient record.   Counseling Intensive lifestyle modifications are the first line treatment for this issue. . Dietary changes: Increase soluble fiber. Decrease simple carbohydrates. . Exercise changes: Moderate to vigorous-intensity aerobic activity 150 minutes per week if tolerated. . Lipid-lowering medications: see documented in medical record.  3. Stage 2 chronic kidney disease Lab results and trends reviewed. We discussed several lifestyle modifications today and he will continue to work on diet, exercise and weight loss efforts. Avoid nephrotoxic medications. Orders and follow up as documented in patient record.   Counseling . Chronic kidney disease (CKD) happens when the kidneys are damaged over a long period of time. . Most of the time, this condition does not go away, but it can usually be controlled. Steps must be taken to slow down the kidney damage or to stop it from getting worse. . Intensive lifestyle modifications are the first line treatment for this issue.  Marland Kitchen Avoid buying foods that are: processed, frozen, or prepackaged to avoid excess salt.  4. Vitamin D deficiency Low Vitamin D level contributes to fatigue and are associated with obesity, breast, and colon cancer. He agrees to continue to take prescription Vitamin D @50 ,000 IU every week and will follow-up for routine testing of Vitamin D, at least 2-3 times per year to avoid over-replacement.  - Vitamin D, Ergocalciferol, (DRISDOL) 1.25 MG (50000 UNIT) CAPS capsule; Take 1 capsule (50,000 Units total) by mouth every 7 (seven) days.  Dispense: 4 capsule; Refill: 0  5. Osteoarthritis of both hips, unspecified osteoarthritis type Will refer Dalson to physical therapy.  - Ambulatory referral to Physical Therapy  6. Other depression, with emotional eating Behavior modification techniques were discussed today to help [name] deal with his emotional/non-hunger eating behaviors.  Orders and follow up  as documented in patient record.   7. Class 3 severe obesity with serious comorbidity and body mass index (BMI) of 45.0 to 49.9 in adult, unspecified obesity type [facility]) [name] is currently in the action stage of change. As such, his goal is to continue with weight loss efforts. He has agreed to the Category 2 Plan.   Exercise goals: For substantial health benefits, adults should do at least 150 minutes (2 hours and 30 minutes) a week of moderate-intensity, or 75 minutes (1 hour and 15 minutes) a week of vigorous-intensity aerobic physical activity, or an equivalent combination of moderate- and vigorous-intensity aerobic activity. Aerobic activity should be performed in episodes of at least 10 minutes, and preferably, it should be spread throughout the week. Adults should also include muscle-strengthening activities that involve all major muscle groups on 2 or more days a week.  Behavioral modification strategies: increasing lean protein intake, decreasing simple carbohydrates, increasing vegetables and increasing water intake.  [name] has agreed to follow-up with our clinic in 2 weeks. He was informed of the importance of frequent follow-up visits to maximize his success with intensive lifestyle modifications for his multiple health conditions.  Objective:   VITALS: Per patient if applicable, see vitals. GENERAL: Alert and in no acute distress. CARDIOPULMONARY: No increased WOB. Speaking in clear sentences.  PSYCH: Pleasant and cooperative. Speech  normal rate and rhythm. Affect is appropriate. Insight and judgement are appropriate. Attention is focused, linear, and appropriate.  NEURO: Oriented as arrived to appointment on time with no prompting.   Lab Results  Component Value Date   CREATININE 1.66 (H) 04/13/2019   BUN 29 (H) 04/13/2019   NA 138 04/13/2019   K 4.6 04/13/2019   CL 103 04/13/2019   CO2 24 04/13/2019   Lab Results  Component Value Date   ALT 22 04/13/2019   AST 22 04/13/2019    ALKPHOS 124 (H) 02/15/2019   BILITOT 0.8 04/13/2019   Lab Results  Component Value Date   HGBA1C 6.4 (H) 04/13/2019   HGBA1C 6.4 (H) 02/15/2019   HGBA1C 6.6 (A) 06/09/2018   HGBA1C 6.5 (A) 01/26/2018   HGBA1C 6.0 (H) 02/11/2017   Lab Results  Component Value Date   INSULIN 17.7 02/15/2019   Lab Results  Component Value Date   TSH 1.81 04/13/2019   Lab Results  Component Value Date   CHOL 223 (H) 04/13/2019   HDL 34 (L) 04/13/2019   LDLCALC 141 (H) 04/13/2019   TRIG 338 (H) 04/13/2019   CHOLHDL 6.6 (H) 04/13/2019   Lab Results  Component Value Date   WBC 8.4 08/26/2018   HGB 13.6 08/26/2018   HCT 38.9 08/26/2018   MCV 85 08/26/2018   PLT 262 08/26/2018   Lab Results  Component Value Date   IRON 88 06/09/2018   TIBC 366 06/09/2018   FERRITIN 15 (L) 06/09/2018    Attestation Statements:   Reviewed by clinician on day of visit: allergies, medications, problem list, medical history, surgical history, family history, social history, and previous encounter notes.  I, Water quality scientist, CMA, am acting as Location manager for [employer], [place].  I have reviewed the above documentation for accuracy and completeness, and I agree with the above. [name], DO

## 2019-06-20 ENCOUNTER — Other Ambulatory Visit (INDEPENDENT_AMBULATORY_CARE_PROVIDER_SITE_OTHER): Payer: Self-pay | Admitting: Family Medicine

## 2019-06-20 DIAGNOSIS — E119 Type 2 diabetes mellitus without complications: Secondary | ICD-10-CM

## 2019-06-23 ENCOUNTER — Encounter (INDEPENDENT_AMBULATORY_CARE_PROVIDER_SITE_OTHER): Payer: Self-pay | Admitting: Bariatrics

## 2019-06-26 ENCOUNTER — Telehealth (INDEPENDENT_AMBULATORY_CARE_PROVIDER_SITE_OTHER): Payer: Medicaid Other | Admitting: Family Medicine

## 2019-06-26 ENCOUNTER — Other Ambulatory Visit: Payer: Self-pay

## 2019-06-26 DIAGNOSIS — E7849 Other hyperlipidemia: Secondary | ICD-10-CM | POA: Diagnosis not present

## 2019-06-26 DIAGNOSIS — M169 Osteoarthritis of hip, unspecified: Secondary | ICD-10-CM | POA: Diagnosis not present

## 2019-06-26 DIAGNOSIS — E559 Vitamin D deficiency, unspecified: Secondary | ICD-10-CM

## 2019-06-26 DIAGNOSIS — Z6841 Body Mass Index (BMI) 40.0 and over, adult: Secondary | ICD-10-CM | POA: Diagnosis not present

## 2019-06-26 DIAGNOSIS — E119 Type 2 diabetes mellitus without complications: Secondary | ICD-10-CM | POA: Diagnosis not present

## 2019-06-26 MED ORDER — VITAMIN D (ERGOCALCIFEROL) 1.25 MG (50000 UNIT) PO CAPS: 50000.0000 [IU] | ORAL_CAPSULE | ORAL | 0 refills | Status: DC

## 2019-06-26 MED ORDER — LIRAGLUTIDE 18 MG/3ML ~~LOC~~ SOPN: 0.6000 mg | PEN_INJECTOR | Freq: Every day | SUBCUTANEOUS | 0 refills | Status: DC

## 2019-06-26 MED ORDER — BLOOD GLUCOSE MONITOR KIT: PACK | 0 refills | Status: AC

## 2019-06-26 NOTE — Progress Notes (Signed)
TeleHealth Visit:  Due to the COVID-19 pandemic, this visit was completed with telemedicine (audio/video) technology to reduce patient and provider exposure as well as to preserve personal protective equipment.   Herb has verbally consented to this TeleHealth visit. The patient is located at home, the provider is located at the [facility] office. The participants in this visit include the listed provider and patient and. The visit was conducted today via [email or web address].  Chief Complaint: OBESITY [name] is here to discuss his progress with his obesity treatment plan along with follow-up of his obesity related diagnoses. [name] is on the Category 2 Plan and states he is following his eating plan approximately 78% of the time. [name] states he is biking/walking for 40 minutes 4 times per week.  Today's visit was #: 8 Starting weight: 282 lbs Starting date: 02/15/2019  Interim History: [name] is staying hydrated.  He is down 2 pounds.  Subjective:   1. Type 2 diabetes mellitus without complication, without long-term current use of insulin (HCC) Medications reviewed. Drae is taking Victoza.  He does not currently check his blood glucose daily.  Lab Results  Component Value Date   HGBA1C 6.4 (H) 04/13/2019   HGBA1C 6.4 (H) 02/15/2019   HGBA1C 6.6 (A) 06/09/2018   Lab Results  Component Value Date   LDLCALC 141 (H) 04/13/2019   CREATININE 1.66 (H) 04/13/2019   Lab Results  Component Value Date   INSULIN 17.7 02/15/2019   2. Other hyperlipidemia [name] has hyperlipidemia and has been trying to improve his cholesterol levels with intensive lifestyle modification including a low saturated fat diet, exercise and weight loss. He denies any chest pain, claudication or myalgias.  He takes Crestor daily.  Lab Results  Component Value Date   ALT 22 04/13/2019   AST 22 04/13/2019   ALKPHOS 124 (H) 02/15/2019   BILITOT 0.8 04/13/2019   Lab Results  Component Value Date   CHOL 223 (H)  04/13/2019   HDL 34 (L) 04/13/2019   LDLCALC 141 (H) 04/13/2019   TRIG 338 (H) 04/13/2019   CHOLHDL 6.6 (H) 04/13/2019   3. Osteoarthritis of hip, unspecified laterality, unspecified osteoarthritis type [name] has a goal of 260 pounds in order to have orthopedic surgery.  4. Vitamin D deficiency [name]'s Vitamin D level was 27.0 on 04/13/2019. He is currently taking vit D. He denies nausea, vomiting or muscle weakness.  Assessment/Plan:   1. Type 2 diabetes mellitus without complication, without long-term current use of insulin (HCC) Good blood sugar control is important to decrease the likelihood of diabetic complications such as nephropathy, neuropathy, limb loss, blindness, coronary artery disease, and death. Intensive lifestyle modification including diet, exercise and weight loss are the first line of treatment for diabetes.  [name] will start checking his blood glucose twice daily.  - liraglutide (VICTOZA) 18 MG/3ML SOPN; Inject 0.1 mLs (0.6 mg total) into the skin daily.  Dispense: 1 pen; Refill: 0 - blood glucose meter kit and supplies KIT; Test daily Dx  r73.03  Dispense: 1 each; Refill: 0  2. Other hyperlipidemia Cardiovascular risk and specific lipid/LDL goals reviewed.  We discussed several lifestyle modifications today and [name] will continue to work on diet, exercise and weight loss efforts. Orders and follow up as documented in patient record.   Counseling Intensive lifestyle modifications are the first line treatment for this issue. . Dietary changes: Increase soluble fiber. Decrease simple carbohydrates. . Exercise changes: Moderate to vigorous-intensity aerobic activity 150 minutes per week  if tolerated. . Lipid-lowering medications: see documented in medical record.  3. Osteoarthritis of hip, unspecified laterality, unspecified osteoarthritis type Will follow because mobility and pain control are important for weight management.  4. Vitamin D deficiency Low Vitamin D  level contributes to fatigue and are associated with obesity, breast, and colon cancer. He agrees to continue to take prescription Vitamin D _0 ,000 IU every week and will follow-up for routine testing of Vitamin D, at least 2-3 times per year to avoid over-replacement.  - Vitamin D, Ergocalciferol, (DRISDOL) 1.25 MG (50000 UNIT) CAPS capsule; Take 1 capsule (50,000 Units total) by mouth every 7 (seven) days.  Dispense: 4 capsule; Refill: 0  5. Class 3 severe obesity with serious comorbidity and body mass index (BMI) of 45.0 to 49.9 in adult, unspecified obesity type [name]) [name] is currently in the action stage of change. As such, his goal is to continue with weight loss efforts. He has agreed to the Category 2 Plan.   Exercise goals: For substantial health benefits, adults should do at least 150 minutes (2 hours and 30 minutes) a week of moderate-intensity, or 75 minutes (1 hour and 15 minutes) a week of vigorous-intensity aerobic physical activity, or an equivalent combination of moderate- and vigorous-intensity aerobic activity. Aerobic activity should be performed in episodes of at least 10 minutes, and preferably, it should be spread throughout the week. Adults should also include muscle-strengthening activities that involve all major muscle groups on 2 or more days a week.  Behavioral modification strategies: increasing lean protein intake and increasing water intake.  [name] has agreed to follow-up with our clinic in 2 weeks. He was informed of the importance of frequent follow-up visits to maximize his success with intensive lifestyle modifications for his multiple health conditions.  Objective:   VITALS: Per patient if applicable, see vitals. GENERAL: Alert and in no acute distress. CARDIOPULMONARY: No increased WOB. Speaking in clear sentences.  PSYCH: Pleasant and cooperative. Speech normal rate and rhythm. Affect is appropriate. Insight and judgement are appropriate. Attention is focused,  linear, and appropriate.  NEURO: Oriented as arrived to appointment on time with no prompting.   Lab Results  Component Value Date   CREATININE 1.66 (H) 04/13/2019   BUN 29 (H) 04/13/2019   NA 138 04/13/2019   K 4.6 04/13/2019   CL 103 04/13/2019   CO2 24 04/13/2019   Lab Results  Component Value Date   ALT 22 04/13/2019   AST 22 04/13/2019   ALKPHOS 124 (H) 02/15/2019   BILITOT 0.8 04/13/2019   Lab Results  Component Value Date   HGBA1C 6.4 (H) 04/13/2019   HGBA1C 6.4 (H) 02/15/2019   HGBA1C 6.6 (A) 06/09/2018   HGBA1C 6.5 (A) 01/26/2018   HGBA1C 6.0 (H) 02/11/2017   Lab Results  Component Value Date   INSULIN 17.7 02/15/2019   Lab Results  Component Value Date   TSH 1.81 04/13/2019   Lab Results  Component Value Date   CHOL 223 (H) 04/13/2019   HDL 34 (L) 04/13/2019   LDLCALC 141 (H) 04/13/2019   TRIG 338 (H) 04/13/2019   CHOLHDL 6.6 (H) 04/13/2019   Lab Results  Component Value Date   WBC 8.4 08/26/2018   HGB 13.6 08/26/2018   HCT 38.9 08/26/2018   MCV 85 08/26/2018   PLT 262 08/26/2018   Lab Results  Component Value Date   IRON 88 06/09/2018   TIBC 366 06/09/2018   FERRITIN 15 (L) 06/09/2018    Attestation Statements:  Reviewed by clinician on day of visit: allergies, medications, problem list, medical history, surgical history, family history, social history, and previous encounter notes.  I, Water quality scientist, CMA, am acting as Location manager for [employer], [place].  I have reviewed the above documentation for accuracy and completeness, and I agree with the above. [name], DO

## 2019-06-27 ENCOUNTER — Other Ambulatory Visit (INDEPENDENT_AMBULATORY_CARE_PROVIDER_SITE_OTHER): Payer: Self-pay | Admitting: Family Medicine

## 2019-06-27 DIAGNOSIS — E119 Type 2 diabetes mellitus without complications: Secondary | ICD-10-CM

## 2019-06-27 NOTE — Addendum Note (Signed)
Addended by: Dewayne Shorter L on: 06/27/2019 12:06 PM   Modules accepted: Orders

## 2019-06-28 ENCOUNTER — Ambulatory Visit (HOSPITAL_COMMUNITY): Payer: Medicaid Other | Admitting: Physical Therapy

## 2019-06-28 ENCOUNTER — Telehealth (HOSPITAL_COMMUNITY): Payer: Self-pay | Admitting: Physical Therapy

## 2019-06-28 NOTE — Telephone Encounter (Signed)
pt needs to change appt to later in the week

## 2019-06-29 ENCOUNTER — Ambulatory Visit (HOSPITAL_COMMUNITY): Payer: Medicaid Other | Admitting: Physical Therapy

## 2019-07-02 ENCOUNTER — Other Ambulatory Visit: Payer: Self-pay | Admitting: Family Medicine

## 2019-07-02 ENCOUNTER — Other Ambulatory Visit (INDEPENDENT_AMBULATORY_CARE_PROVIDER_SITE_OTHER): Payer: Self-pay | Admitting: Family Medicine

## 2019-07-02 DIAGNOSIS — E119 Type 2 diabetes mellitus without complications: Secondary | ICD-10-CM

## 2019-07-02 DIAGNOSIS — M160 Bilateral primary osteoarthritis of hip: Secondary | ICD-10-CM

## 2019-07-03 ENCOUNTER — Other Ambulatory Visit (INDEPENDENT_AMBULATORY_CARE_PROVIDER_SITE_OTHER): Payer: Self-pay

## 2019-07-03 DIAGNOSIS — M160 Bilateral primary osteoarthritis of hip: Secondary | ICD-10-CM

## 2019-07-03 MED ORDER — NAPROXEN 500 MG PO TABS: 500.0000 mg | ORAL_TABLET | Freq: Every day | ORAL | 0 refills | Status: DC

## 2019-07-05 ENCOUNTER — Other Ambulatory Visit: Payer: Self-pay

## 2019-07-05 ENCOUNTER — Other Ambulatory Visit (INDEPENDENT_AMBULATORY_CARE_PROVIDER_SITE_OTHER): Payer: Self-pay | Admitting: Family Medicine

## 2019-07-05 ENCOUNTER — Encounter (INDEPENDENT_AMBULATORY_CARE_PROVIDER_SITE_OTHER): Payer: Self-pay | Admitting: Family Medicine

## 2019-07-05 ENCOUNTER — Encounter (HOSPITAL_COMMUNITY): Payer: Self-pay | Admitting: Physical Therapy

## 2019-07-05 ENCOUNTER — Ambulatory Visit (HOSPITAL_COMMUNITY): Payer: Medicaid Other | Attending: Family Medicine | Admitting: Physical Therapy

## 2019-07-05 DIAGNOSIS — M25552 Pain in left hip: Secondary | ICD-10-CM

## 2019-07-05 DIAGNOSIS — M25551 Pain in right hip: Secondary | ICD-10-CM | POA: Diagnosis not present

## 2019-07-05 DIAGNOSIS — R262 Difficulty in walking, not elsewhere classified: Secondary | ICD-10-CM

## 2019-07-05 DIAGNOSIS — E119 Type 2 diabetes mellitus without complications: Secondary | ICD-10-CM

## 2019-07-06 ENCOUNTER — Encounter (HOSPITAL_COMMUNITY): Payer: Self-pay | Admitting: Physical Therapy

## 2019-07-06 NOTE — Therapy (Signed)
[place], [place], [address] Phone: [phone]   Fax:  [phone]  Physical Therapy Evaluation  Patient Details  Name: [name] MRN: [id number] Date of Birth: [birth year] Referring Provider (PT): [name]   Encounter Date: 07/05/2019  PT End of Session - 07/05/19 1604    Visit Number  1    Number of Visits  12    Date for PT Re-Evaluation  08/16/19    Authorization Type  medicaid- check for auth    Authorization Time Period  POC dates 07/05/19 to 08/16/2019    Authorization - Visit Number  1    Authorization - Number of Visits  1    PT Start Time  [id number]    PT Stop Time  1700    PT Time Calculation (min)  45 min    Activity Tolerance  Patient limited by pain    Behavior During Therapy  Restless       Past Medical History:  Diagnosis Date  . Anemia   . Arthritis    "hips" (01/15/2015)  . Blood transfusion without reported diagnosis   . Chronic lower back pain   . Colon polyps   . Depression    denies  . Diabetes mellitus    diet controlled- no meds since wt loss  . Dysrhythmia    s/p surgery bariatric- cardioversion  . GERD (gastroesophageal reflux disease)   . Hip pain   . History of gout   . Hyperlipidemia   . Hypertension   . Joint pain   . Neuromuscular disorder ([place])   . PONV (postoperative nausea and vomiting)   . Pre-diabetes   . Sleep apnea    no longer have to use cpap since wt loss  . Stage 3 chronic kidney disease 01/26/2018  . Stroke [facility]) 2006   denies residual on 01/15/2015    Past Surgical History:  Procedure Laterality Date  . BARIATRIC SURGERY  2010   in [place]  . CARDIOVERSION    . COLONOSCOPY N/A 08/02/2014   Procedure: COLONOSCOPY;  Surgeon: Rogene Houston, MD;  [facility];  Service: Endoscopy;  Laterality: N/A;  210 - moved to 3/10 @ 11:55 - Ann notified pt  . ESOPHAGOGASTRODUODENOSCOPY    . HERNIA REPAIR    . LIPOSUCTION    . PANNICULECTOMY   01/14/2015  . PANNICULECTOMY N/A 01/14/2015   Procedure:  TOTAL PANNICULECTOMY WTH LIPOSUCTION OF SIDES;  Surgeon: Cristine Polio, MD;  Location: [facility];  Service: Plastics;  Laterality: N/A;  . TONSILLECTOMY    . VENTRAL HERNIA REPAIR  01/14/2015  . VENTRAL HERNIA REPAIR N/A 01/14/2015   Procedure: HERNIA REPAIR VENTRAL ADULT AND MUSCLE REPAIR;  Surgeon: Cristine Polio, MD;  Location: [facility];  Service: Plastics;  Laterality: N/A;    There were no vitals filed for this visit.   Subjective Assessment - 07/05/19 1621    Subjective  Patient complains of chronic bilateral hip pain that started about a year ago. States he used to have back pain but that once his hips started hurting, he didn't notice the back pain as much. States that he was working at [employer] until the pain got too bad and he could no longer work. States imaging demonstrated advanced hip arthritis. The MD states that he needs to have both hips replaced but that he was told he needs to lose weight first. States he was 600 pounds at one time. States that he  is participating in a weight loss program but he hasn't really lost much weight as of late and he is getting very depressed. He has a Building services engineer at [employer] but everything hurts that he can't do much. States he used to use his friend's pool and that seemed to help with his symptoms. He has been using his walking stick since his hips really started hurting him about a year ago. His pain is worse with movement and weight bearing.    Pertinent History  obesity, depression, advanced hip OA, history of morbid obesity (was 600 pounds), history of low back pain    How long can you stand comfortably?  not comfortable at all    How long can you walk comfortably?  not comfortable at all    Diagnostic tests  xrays - advanced bilateral hip OA    Patient Stated Goals  to have less pain, to return to work    Currently in Pain?  Yes    Pain Score  5     Pain Location  Hip    Pain Orientation   Left;Right    Pain Descriptors / Indicators  Constant    Pain Type  Chronic pain         [facility] PT Assessment - 07/06/19 0001      Assessment   Medical Diagnosis  bilateral hip OA    Referring Provider (PT)  [name]    Prior Therapy  no      Precautions   Precautions  Fall      Restrictions   Weight Bearing Restrictions  No      Balance Screen   Has the patient fallen in the past 6 months  No    Has the patient had a decrease in activity level because of a fear of falling?   Yes    Is the patient reluctant to leave their home because of a fear of falling?   No      Home Social worker  Private residence    Living Arrangements  Alone    Available Help at Discharge  Friend(s)    Type of [place] Access  Level entry    [place]  --   walking stick     Prior Function   Level of Independence  Independent    Vocation  Unemployed    Vocation Requirements  was working a [employer] until pain got too bad      Cognition   Overall Cognitive Status  Within Functional Limits for tasks assessed      AROM   AROM Assessment Site  Lumbar;Hip    Right/Left Hip  Left;Right    Right Hip Flexion  90   pain   Right Hip External Rotation   45   pain   Right Hip Internal Rotation   15   lacking, pain   Left Hip Flexion  90   pain   Left Hip External Rotation   30   pain    Left Hip Internal Rotation   10   lacking, pain     Palpation   Palpation comment  left hip joint - hypomobile, decreased pain with long axis traction on left - unable to assess R due to time and positioning      Special Tests    Special Tests  Hip Special Tests    Hip Special  Tests   Hip Scouring      Hip Scouring   Findings  Positive    Side  Right;Left      Transfers   Transfers  Sit to Stand;Stand to Sit;Supine to Sit;Sit to Supine    Sit to Stand  6: Modified independent (Device/Increase time)    Stand to Sit  6: Modified independent  (Device/Increase time)    Supine to Sit  6: Modified independent (Device/Increase time)    Sit to Supine  6: Modified independent (Device/Increase time)    Comments  1 rep took 8.5 seconds, uses hands and walking stick to transition from sit<-> stand  with slow labored movements. sit <->supine slow labored movements, needs to use momentum to sit back up at edge of bed      Ambulation/Gait   Ambulation/Gait  Yes    Ambulation/Gait Assistance  6: Modified independent (Device/Increase time)    Ambulation Distance (Feet)  196 Feet    Assistive device  --   walking stick   Gait Pattern  Wide base of support;Decreased hip/knee flexion - right;Decreased hip/knee flexion - left;Decreased dorsiflexion - right;Decreased dorsiflexion - left;Right circumduction;Left circumduction    Gait velocity  decreased    Stairs  Yes    Stairs Assistance  6: Modified independent (Device/Increase time)    Stair Management Technique  Step to pattern   with walking stick and railing, leads with right LE   Gait Comments  painful to walk                Objective measurements completed on examination: See above findings.                PT Short Term Goals - 07/06/19 [id number]      PT SHORT TERM GOAL #1   Title  Patient will be able to transition from sit to stand and stand to sit in under 6 seconds to demonstrate improved transitional mobility.    Baseline  2/10 8.5 seconds with B UE use and walking stick for assist    Time  3    Period  Weeks    Status  New    Target Date  07/27/19      PT SHORT TERM GOAL #2   Title  Patient will be independent in HEP to improve functional outcomes.    Time  3    Period  Weeks    Status  New    Target Date  07/27/19      PT SHORT TERM GOAL #3   Title  Patient will be able to ambulate at least 226 feet in 2 minutes with required assistive device to demonstrate improved walking endurance.    Time  3    Period  Weeks    Status  New    Target Date   07/27/19        PT Long Term Goals - 07/06/19 0941      PT LONG TERM GOAL #1   Title  Patient will report at least 50% improvement in overall symptoms to improve QOL.    Time  6    Period  Weeks    Status  New    Target Date  08/16/19      PT LONG TERM GOAL #2   Title  Patient will be able to ambulate at least 275 feet in 2 minutes with required assistive device to demonstrate improved walking endurance.    Time  6    Period  Weeks    Status  New    Target Date  08/16/19      PT LONG TERM GOAL #3   Title  Patient will be able to perform 5x sit to stand test in under 50 seconds to demonstrate improved functional endurance.    Baseline  unable at time of eval    Time  6    Period  Weeks    Status  New    Target Date  08/16/19             Plan - 07/06/19 [id number]    Clinical Impression Statement  Patient presents with intense bilateral hip pain secondary to advanced arthritis that limits his ability to ambulate, get up from a chair and lay down in bed. Pain is also preventing him from working at this time which is playing into patient's symptoms of depression. Patient's obesity likely contributing to current pain presentation, but he is actively working with a health counselor to reduce his weight. Discussed how therapy could help with pain, hip ROM and overall functional mobility. Educated patient in benefits of aquatic therapy and may trial aquatic therapy when it becomes available at this facility in March. Patient would greatly benefit from skilled physical therapy to address physical impairments and improve overall functional mobility so patient can return to work.    Personal Factors and Comorbidities  Comorbidity 1;Comorbidity 2;Finances    Comorbidities  depression, obesity, severe B hip OA, hx lumbar pain    Examination-Activity Limitations  Bed Mobility;Bathing;Sit;Squat;Bend;Stairs;Stand;Carry;Transfers;Hygiene/Grooming;Lift;Locomotion Level;Reach Overhead     Examination-Participation Restrictions  Meal Prep;Laundry;Yard Work;Community Activity;Cleaning    Stability/Clinical Decision Making  Evolving/Moderate complexity    Clinical Decision Making  Moderate    Rehab Potential  Fair    PT Frequency  2x / week    PT Duration  6 weeks    PT Treatment/Interventions  ADLs/Self Care Home Management;Aquatic Therapy;Biofeedback;Cryotherapy;Electrical Stimulation;Moist Heat;Traction;Iontophoresis 4mg /ml Dexamethasone;Balance training;Therapeutic exercise;Manual techniques;Therapeutic activities;Functional mobility training;Stair training;Gait training;Neuromuscular re-education;Patient/family education;Dry needling;Passive range of motion;Joint Manipulations    PT Next Visit Plan  NWB secondary to severe B hip OA, hip mobilizations, hip long axis traction, hip/lumbar ROM as tolerated, trial traction, core strengthening    PT Home Exercise Plan  discussed return to pool at [facility] with Plan of Care  Patient       Patient will benefit from skilled therapeutic intervention in order to improve the following deficits and impairments:  Abnormal gait, Decreased endurance, Hypomobility, Obesity, Decreased activity tolerance, Decreased mobility, Difficulty walking, Decreased balance, Decreased strength, Pain, Decreased range of motion, Improper body mechanics  Visit Diagnosis: Pain in left hip  Pain in right hip  Difficulty in walking, not elsewhere classified     Problem List Patient Active Problem List   Diagnosis Date Noted  . Vitamin D deficiency 04/13/2019  . Depression 04/13/2019  . Class 3 severe obesity with serious comorbidity and body mass index (BMI) of 40.0 to 44.9 in adult [facility]) 04/13/2019  . 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 ([place]) 08/01/2018  . Cellulitis 07/25/2018  . Healthcare maintenance 06/09/2018  . Erectile dysfunction 01/26/2018  . Stage 3 chronic kidney disease 01/26/2018  . Need for immunization  against influenza 01/26/2018  . Prediabetes 03/29/2017  . Chronic back pain greater than 3 months duration 02/11/2017  . Narcotic dependence ([place]) 02/11/2017  . Benzodiazepine dependence ([place]) 02/11/2017  . Gastric bypass status for obesity 02/11/2017  . COLONIC POLYPS, ADENOMATOUS 08/03/2008  .  Hyperlipidemia 08/03/2008  . MORBID OBESITY 08/03/2008  . CVA 08/03/2008  . HIATAL HERNIA 08/03/2008  . SLEEP APNEA 08/03/2008  . ALLERGY 08/03/2008    12:42 PM, 07/06/19 Jerene Pitch, DPT Physical Therapy with [facility]  [phone] office  [place] [address] [place], [place], [address] Phone: [phone]   Fax:  [phone]  Name: [name] MRN: [id number] Date of Birth: February 12, 1971

## 2019-07-10 ENCOUNTER — Ambulatory Visit (INDEPENDENT_AMBULATORY_CARE_PROVIDER_SITE_OTHER): Payer: Medicaid Other | Admitting: Family Medicine

## 2019-07-10 ENCOUNTER — Encounter (INDEPENDENT_AMBULATORY_CARE_PROVIDER_SITE_OTHER): Payer: Self-pay

## 2019-07-10 DIAGNOSIS — E119 Type 2 diabetes mellitus without complications: Secondary | ICD-10-CM

## 2019-07-10 DIAGNOSIS — M160 Bilateral primary osteoarthritis of hip: Secondary | ICD-10-CM | POA: Insufficient documentation

## 2019-07-10 HISTORY — DX: Type 2 diabetes mellitus without complications: E11.9

## 2019-07-11 ENCOUNTER — Encounter (HOSPITAL_COMMUNITY): Payer: Self-pay

## 2019-07-11 ENCOUNTER — Ambulatory Visit (HOSPITAL_COMMUNITY): Payer: Medicaid Other

## 2019-07-11 ENCOUNTER — Other Ambulatory Visit: Payer: Self-pay

## 2019-07-11 DIAGNOSIS — R262 Difficulty in walking, not elsewhere classified: Secondary | ICD-10-CM

## 2019-07-11 DIAGNOSIS — M25551 Pain in right hip: Secondary | ICD-10-CM | POA: Diagnosis not present

## 2019-07-11 DIAGNOSIS — M25552 Pain in left hip: Secondary | ICD-10-CM

## 2019-07-11 NOTE — Therapy (Signed)
[place], [place], [address] Phone: [phone]   Fax:  [phone]  Physical Therapy Treatment  Patient Details  Name: [name] MRN: [id number] Date of Birth: 1970-11-19 Referring Provider (PT): [name]   Encounter Date: 07/11/2019  PT End of Session - 07/11/19 1025    Visit Number  2    Number of Visits  12    Date for PT Re-Evaluation  08/16/19    Authorization Type  medicaid- 2/12-2/21 3 visits approved    Authorization Time Period  POC dates 07/05/19 to 08/16/2019    Authorization - Visit Number  2    Authorization - Number of Visits  4    PT Start Time  1030    PT Stop Time  1110    PT Time Calculation (min)  40 min    Activity Tolerance  Patient tolerated treatment well;No increased pain    Behavior During Therapy  WFL for tasks assessed/performed       Past Medical History:  Diagnosis Date  . Anemia   . Arthritis    "hips" (01/15/2015)  . Blood transfusion without reported diagnosis   . Chronic lower back pain   . Colon polyps   . Depression    denies  . Diabetes mellitus    diet controlled- no meds since wt loss  . Dysrhythmia    s/p surgery bariatric- cardioversion  . GERD (gastroesophageal reflux disease)   . Hip pain   . History of gout   . Hyperlipidemia   . Hypertension   . Joint pain   . Neuromuscular disorder ([place])   . PONV (postoperative nausea and vomiting)   . Pre-diabetes   . Sleep apnea    no longer have to use cpap since wt loss  . Stage 3 chronic kidney disease 01/26/2018  . Stroke [facility]) 2006   denies residual on 01/15/2015    Past Surgical History:  Procedure Laterality Date  . BARIATRIC SURGERY  2010   in [place]  . CARDIOVERSION    . COLONOSCOPY N/A 08/02/2014   Procedure: COLONOSCOPY;  Surgeon: Rogene Houston, MD;  [facility];  Service: Endoscopy;  Laterality: N/A;  210 - moved to 3/10 @ 11:55 - Ann notified pt  .  ESOPHAGOGASTRODUODENOSCOPY    . HERNIA REPAIR    . LIPOSUCTION    . PANNICULECTOMY  01/14/2015  . PANNICULECTOMY N/A 01/14/2015   Procedure:  TOTAL PANNICULECTOMY WTH LIPOSUCTION OF SIDES;  Surgeon: Cristine Polio, MD;  Location: [facility];  Service: Plastics;  Laterality: N/A;  . TONSILLECTOMY    . VENTRAL HERNIA REPAIR  01/14/2015  . VENTRAL HERNIA REPAIR N/A 01/14/2015   Procedure: HERNIA REPAIR VENTRAL ADULT AND MUSCLE REPAIR;  Surgeon: Cristine Polio, MD;  Location: [facility];  Service: Plastics;  Laterality: N/A;    There were no vitals filed for this visit.  Subjective Assessment - 07/11/19 1025    Subjective  Pt reports getting in/out of car is difficult. Pt reports no issues after evaluation. Pt reports walking around at [employer] this morning.    Pertinent History  obesity, depression, advanced hip OA, history of morbid obesity (was 600 pounds), history of low back pain    How long can you stand comfortably?  not comfortable at all    How long can you walk comfortably?  not comfortable at all    Diagnostic tests  xrays - advanced bilateral hip OA  Patient Stated Goals  to have less pain, to return to work    Currently in Pain?  Yes    Pain Score  5     Pain Location  Hip    Pain Orientation  Left;Right    Pain Descriptors / Indicators  Constant    Pain Type  Chronic pain            [facility] Adult PT Treatment/Exercise - 07/11/19 0001      Exercises   Exercises  Knee/Hip      Knee/Hip Exercises: Aerobic   Nustep  level 2, seat 11, 4 min, >130spm      Knee/Hip Exercises: Seated   Sit to Sand  5 reps      Knee/Hip Exercises: Supine   Quad Sets  Both;10 reps    Quad Sets Limitations  3 sec hold    Bridges  15 reps    Bridges Limitations  belt around thighs to maintain proper positioning    Other Supine Knee/Hip Exercises  ab set with exhale, x20 reps with verbal cues for form    Other Supine Knee/Hip Exercises  isometric glute activation with belt around thighs  for support, x20 reps with 3 sec hold, cues to avoid breath holding; hooklying clams with pink belt around thighs, x15 reps      Manual Therapy   Manual Therapy  Manual Traction    Manual therapy comments  completed seperate from rest of treatment interventions    Manual Traction  LLE long axis traction, 3x30 sec static hold to alleviate hip pain and improve mobility             PT Education - 07/11/19 1025    Education Details  Reviewed goals, exercise technique, initiated HEP    Person(s) Educated  Patient    Methods  Explanation;Demonstration;Verbal cues;Handout    Comprehension  Verbalized understanding;Returned demonstration       PT Short Term Goals - 07/11/19 1043      PT SHORT TERM GOAL #1   Title  Patient will be able to transition from sit to stand and stand to sit in under 6 seconds to demonstrate improved transitional mobility.    Baseline  2/10 8.5 seconds with B UE use and walking stick for assist    Time  3    Period  Weeks    Status  On-going    Target Date  07/27/19      PT SHORT TERM GOAL #2   Title  Patient will be independent in HEP to improve functional outcomes.    Time  3    Period  Weeks    Status  On-going    Target Date  07/27/19      PT SHORT TERM GOAL #3   Title  Patient will be able to ambulate at least 226 feet in 2 minutes with required assistive device to demonstrate improved walking endurance.    Time  3    Period  Weeks    Status  On-going    Target Date  07/27/19        PT Long Term Goals - 07/11/19 1044      PT LONG TERM GOAL #1   Title  Patient will report at least 50% improvement in overall symptoms to improve QOL.    Time  6    Period  Weeks    Status  On-going      PT LONG TERM GOAL #2   Title  Patient will be able to ambulate at least 275 feet in 2 minutes with required assistive device to demonstrate improved walking endurance.    Time  6    Period  Weeks    Status  On-going      PT LONG TERM GOAL #3   Title   Patient will be able to perform 5x sit to stand test in under 50 seconds to demonstrate improved functional endurance.    Baseline  unable at time of eval    Time  6    Period  Weeks    Status  On-going            Plan - 07/11/19 1026    Clinical Impression Statement  Began with NuStep for cardiovascular endurance training and decreased weightbearing strengthening on level 2 with good results. Pt with increased L hip pain with isometric quad set, improved with long axis LLE traction. Pt tolerates supine strengthening exercises with constant verbal cues for form and avoiding breath holding. Pt able to perform STS with verbal cues to push through heels improving power up performance and reducing pain. Pt reports "pain is down a little bit" at EOS. Continue to progress as able.    Personal Factors and Comorbidities  Comorbidity 1;Comorbidity 2;Finances    Comorbidities  depression, obesity, severe B hip OA, hx lumbar pain    Examination-Activity Limitations  Bed Mobility;Bathing;Sit;Squat;Bend;Stairs;Stand;Carry;Transfers;Hygiene/Grooming;Lift;Locomotion Level;Reach Overhead    Examination-Participation Restrictions  Meal Prep;Laundry;Yard Work;Community Activity;Cleaning    Stability/Clinical Decision Making  Evolving/Moderate complexity    Rehab Potential  Fair    PT Frequency  2x / week    PT Duration  6 weeks    PT Treatment/Interventions  ADLs/Self Care Home Management;Aquatic Therapy;Biofeedback;Cryotherapy;Electrical Stimulation;Moist Heat;Traction;Iontophoresis 4mg /ml Dexamethasone;Balance training;Therapeutic exercise;Manual techniques;Therapeutic activities;Functional mobility training;Stair training;Gait training;Neuromuscular re-education;Patient/family education;Dry needling;Passive range of motion;Joint Manipulations    PT Next Visit Plan  Continue core strengthening and decreased WB exercise for pain tolerance. Use hip mobilizations, hip long axis traction, hip/lumbar ROM as  tolerated, trial traction as needed for pain control.    PT Home Exercise Plan  discussed return to pool at [facility]; 2/16: ab set with exhale, bridge    Consulted and Agree with Plan of Care  Patient       Patient will benefit from skilled therapeutic intervention in order to improve the following deficits and impairments:  Abnormal gait, Decreased endurance, Hypomobility, Obesity, Decreased activity tolerance, Decreased mobility, Difficulty walking, Decreased balance, Decreased strength, Pain, Decreased range of motion, Improper body mechanics  Visit Diagnosis: Pain in left hip  Pain in right hip  Difficulty in walking, not elsewhere classified     Problem List Patient Active Problem List   Diagnosis Date Noted  . Primary osteoarthritis of both hips 07/10/2019  . Diabetes mellitus ([place]) 07/10/2019  . Vitamin D deficiency 04/13/2019  . Depression 04/13/2019  . Class 3 severe obesity with serious comorbidity and body mass index (BMI) of 40.0 to 44.9 in adult [facility]) 04/13/2019  . 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 ([place]) 08/01/2018  . Cellulitis 07/25/2018  . Erectile dysfunction 01/26/2018  . Stage 3 chronic kidney disease 07/20/2017  . Persistent proteinuria 07/20/2017  . History of cerebrovascular accident 07/20/2017  . Gastroesophageal reflux disease 07/20/2017  . Prediabetes 03/29/2017  . Chronic back pain greater than 3 months duration 02/11/2017  . Narcotic dependence ([place]) 02/11/2017  . Benzodiazepine dependence ([place]) 02/11/2017  . Gastric bypass status for obesity 02/11/2017  . COLONIC POLYPS, ADENOMATOUS 08/03/2008  .  Type 2 diabetes mellitus with diabetic nephropathy ([place]) 08/03/2008  . Hyperlipidemia 08/03/2008  . CVA 08/03/2008  . HIATAL HERNIA 08/03/2008  . SLEEP APNEA 08/03/2008  . ALLERGY 08/03/2008     Talbot Grumbling PT, DPT 07/11/19, 11:15 AM [place] [address] [place],  [place], [address] Phone: [phone]   Fax:  [phone]  Name: JAVELLE CAPITO MRN: [id number] Date of Birth: [birth year]

## 2019-07-14 ENCOUNTER — Telehealth (HOSPITAL_COMMUNITY): Payer: Self-pay | Admitting: Physical Therapy

## 2019-07-14 ENCOUNTER — Ambulatory Visit (HOSPITAL_COMMUNITY): Payer: Medicaid Other | Admitting: Physical Therapy

## 2019-07-14 DIAGNOSIS — G894 Chronic pain syndrome: Secondary | ICD-10-CM | POA: Diagnosis not present

## 2019-07-14 DIAGNOSIS — M79652 Pain in left thigh: Secondary | ICD-10-CM | POA: Diagnosis not present

## 2019-07-14 DIAGNOSIS — M545 Low back pain: Secondary | ICD-10-CM | POA: Diagnosis not present

## 2019-07-14 DIAGNOSIS — Z79891 Long term (current) use of opiate analgesic: Secondary | ICD-10-CM | POA: Diagnosis not present

## 2019-07-14 NOTE — Telephone Encounter (Signed)
pt called to cancel today's appt due to the weather.

## 2019-07-17 ENCOUNTER — Ambulatory Visit (INDEPENDENT_AMBULATORY_CARE_PROVIDER_SITE_OTHER): Payer: Medicaid Other | Admitting: Family Medicine

## 2019-07-18 ENCOUNTER — Other Ambulatory Visit: Payer: Self-pay

## 2019-07-18 ENCOUNTER — Ambulatory Visit (HOSPITAL_COMMUNITY): Payer: Medicaid Other | Admitting: Physical Therapy

## 2019-07-18 ENCOUNTER — Encounter (HOSPITAL_COMMUNITY): Payer: Self-pay | Admitting: Physical Therapy

## 2019-07-18 DIAGNOSIS — M25551 Pain in right hip: Secondary | ICD-10-CM

## 2019-07-18 DIAGNOSIS — M25552 Pain in left hip: Secondary | ICD-10-CM

## 2019-07-18 DIAGNOSIS — R262 Difficulty in walking, not elsewhere classified: Secondary | ICD-10-CM

## 2019-07-18 NOTE — Therapy (Signed)
[place], [place], [address] Phone: [phone]   Fax:  [phone]  Physical Therapy Treatment  Patient Details  Name: [name] MRN: [id number] Date of Birth: 08-24-1970 Referring Provider (PT): [name]   Encounter Date: 07/18/2019  PT End of Session - 07/18/19 1532    Visit Number  3    Number of Visits  12    Date for PT Re-Evaluation  08/16/19    Authorization Type  medicaid- 2/12-2/21 3 visits approved; (Submitted for remaining visits (8) on 07/18/19)    Authorization Time Period  POC dates 07/05/19 to 08/16/2019    Authorization - Visit Number  2   Visit non billed today due to outside [place] period   Authorization - Number of Visits  4    PT Start Time  1520    PT Stop Time  1555    PT Time Calculation (min)  35 min    Activity Tolerance  Patient tolerated treatment well;No increased pain    Behavior During Therapy  WFL for tasks assessed/performed       Past Medical History:  Diagnosis Date  . Anemia   . Arthritis    "hips" (01/15/2015)  . Blood transfusion without reported diagnosis   . Chronic lower back pain   . Colon polyps   . Depression    denies  . Diabetes mellitus    diet controlled- no meds since wt loss  . Dysrhythmia    s/p surgery bariatric- cardioversion  . GERD (gastroesophageal reflux disease)   . Hip pain   . History of gout   . Hyperlipidemia   . Hypertension   . Joint pain   . Neuromuscular disorder ([place])   . PONV (postoperative nausea and vomiting)   . Pre-diabetes   . Sleep apnea    no longer have to use cpap since wt loss  . Stage 3 chronic kidney disease 01/26/2018  . Stroke [facility]) 2006   denies residual on 01/15/2015    Past Surgical History:  Procedure Laterality Date  . BARIATRIC SURGERY  2010   in [place]  . CARDIOVERSION    . COLONOSCOPY N/A 08/02/2014   Procedure: COLONOSCOPY;  Surgeon: Rogene Houston, MD;  [facility];  Service:  Endoscopy;  Laterality: N/A;  210 - moved to 3/10 @ 11:55 - Ann notified pt  . ESOPHAGOGASTRODUODENOSCOPY    . HERNIA REPAIR    . LIPOSUCTION    . PANNICULECTOMY  01/14/2015  . PANNICULECTOMY N/A 01/14/2015   Procedure:  TOTAL PANNICULECTOMY WTH LIPOSUCTION OF SIDES;  Surgeon: Cristine Polio, MD;  Location: [place];  Service: Plastics;  Laterality: N/A;  . TONSILLECTOMY    . VENTRAL HERNIA REPAIR  01/14/2015  . VENTRAL HERNIA REPAIR N/A 01/14/2015   Procedure: HERNIA REPAIR VENTRAL ADULT AND MUSCLE REPAIR;  Surgeon: Cristine Polio, MD;  Location: [place];  Service: Plastics;  Laterality: N/A;    There were no vitals filed for this visit.  Subjective Assessment - 07/18/19 1528    Subjective  Patient says he is doing a little better. Says he feels a little better in the morning, not as stiff. Says exercises are going well, and helping. Still gets stiff with prolonged positions like sitting for long.    Pertinent History  obesity, depression, advanced hip OA, history of morbid obesity (was 600 pounds), history of low back pain    How long can you stand comfortably?  5  minutes    How long can you walk comfortably?  15 minutes using SPC    Diagnostic tests  xrays - advanced bilateral hip OA    Patient Stated Goals  to have less pain, to return to work    Currently in Pain?  Yes    Pain Score  5     Pain Location  Hip    Pain Orientation  Right;Left    Pain Descriptors / Indicators  Constant    Pain Type  Chronic pain    Pain Onset  More than a month ago    Pain Frequency  Constant    Aggravating Factors   walking, standing    Pain Relieving Factors  sitting, rest    Effect of Pain on Daily Activities  Limits         [facility] PT Assessment - 07/18/19 0001      Assessment   Medical Diagnosis  bilateral hip OA    Referring Provider (PT)  [name]    Prior Therapy  no      Precautions   Precautions  Fall      Restrictions   Weight Bearing Restrictions  No      [place] residence    Living Arrangements  Alone    Available Help at Discharge  Friend(s)    Type of [place] Access  Level entry    [place]  One level      Prior Function   Level of Independence  Independent    Vocation  Unemployed    Vocation Requirements  was working a Designer, 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      Cognition   Overall Cognitive Status  Within Functional Limits for tasks assessed      AROM   Right Hip Extension  0    Right Hip Flexion  85    Left Hip Extension  0    Left Hip Flexion  85      Strength   Right Hip Flexion  3-/5   pain   Right Hip Extension  3-/5   pain   Right Hip ABduction  3-/5   pain   Left Hip Flexion  3-/5   pain   Left Hip Extension  3-/5   pain   Left Hip ABduction  3-/5   pain   Right Knee Flexion  5/5    Right Knee Extension  4+/5    Left Knee Flexion  5/5    Left Knee Extension  4+/5   pain in hip     Transfers   Comments  STS x 1, 5.6 sec with use of both hands       Ambulation/Gait   Ambulation/Gait  Yes    Ambulation/Gait Assistance  6: Modified independent (Device/Increase time)    Ambulation Distance (Feet)  230 Feet    Assistive device  Straight cane    Gait Pattern  Wide base of support;Decreased hip/knee flexion - right;Decreased hip/knee flexion - left;Decreased dorsiflexion - right;Decreased dorsiflexion - left;Right circumduction;Left circumduction    Ambulation Surface  Level;Indoor    Gait velocity  decreased    Gait Comments  2MWT      Balance   Balance Assessed  Yes      Static Standing Balance   Static Standing Balance -  Activities   Tandam Stance - Right Leg;Tandam Stance - Left Leg    Static  Standing - Comment/# of Minutes  3 sec; 7 sec                            PT Education - 07/18/19 1531    Education Details  On reassessment findings and POC    Person(s) Educated  Patient    Methods  Explanation    Comprehension  Verbalized understanding        PT Short Term Goals - 07/18/19 1544      PT SHORT TERM GOAL #1   Title  Patient will be able to transition from sit to stand and stand to sit in under 6 seconds to demonstrate improved transitional mobility.    Baseline  2/10 8.5 seconds with B UE use and walking stick for assist; 07/18/19: 5.6 sec    Time  3    Period  Weeks    Status  Achieved    Target Date  07/27/19      PT SHORT TERM GOAL #2   Title  Patient will be independent in HEP to improve functional outcomes.    Baseline  07/18/19: patient reports complaince with HEP with no issues, says exercises have been helping    Time  3    Period  Weeks    Status  Achieved    Target Date  07/27/19      PT SHORT TERM GOAL #3   Title  Patient will be able to ambulate at least 226 feet in 2 minutes with required assistive device to demonstrate improved walking endurance.    Time  3    Period  Weeks    Status  Achieved    Target Date  07/27/19        PT Long Term Goals - 07/18/19 1546      PT LONG TERM GOAL #1   Title  Patient will report at least 50% improvement in overall symptoms to improve QOL.    Baseline  07/18/19: 30% reported    Time  6    Period  Weeks    Status  On-going      PT LONG TERM GOAL #2   Title  Patient will be able to ambulate at least 275 feet in 2 minutes with required assistive device to demonstrate improved walking endurance.    Time  6    Period  Weeks    Status  On-going      PT LONG TERM GOAL #3   Title  Patient will be able to perform 5x sit to stand test in under 50 seconds to demonstrate improved functional endurance.    Baseline  07/18/19: 19.5 seconds, with heavy use of arms for initial lift from chair    Time  6    Period  Weeks    Status  On-going            Plan - 07/18/19 1700    Clinical Impression Statement  Patient has made good progress toward therapy goals. Patient currently with all STGs met, but progress to LTGs is ongoing. Patient has shown improvement in transfer  speed, and ambulatory distance, but continues to be limited by decreased strength, significant ROM restriction, gait and balance deficits which continue to negatively impact functional mobility and ADLs, and are likely contributing to pain. Patient will continue to benefit from skilled physical therapy services to address these deficits to reduce pain and improve level of function with ADLs and functional mobility.  Personal Factors and Comorbidities  Comorbidity 1;Comorbidity 2;Finances    Comorbidities  depression, obesity, severe B hip OA, hx lumbar pain    Examination-Activity Limitations  Bed Mobility;Bathing;Sit;Squat;Bend;Stairs;Stand;Carry;Transfers;Hygiene/Grooming;Lift;Locomotion Level;Reach Overhead    Examination-Participation Restrictions  Meal Prep;Laundry;Yard Work;Community Activity;Cleaning    Stability/Clinical Decision Making  Evolving/Moderate complexity    Rehab Potential  Fair    PT Frequency  2x / week    PT Duration  6 weeks    PT Treatment/Interventions  ADLs/Self Care Home Management;Aquatic Therapy;Biofeedback;Cryotherapy;Electrical Stimulation;Moist Heat;Traction;Iontophoresis 7m/ml Dexamethasone;Balance training;Therapeutic exercise;Manual techniques;Therapeutic activities;Functional mobility training;Stair training;Gait training;Neuromuscular re-education;Patient/family education;Dry needling;Passive range of motion;Joint Manipulations    PT Next Visit Plan  Continue core strengthening and decreased WB exercise for pain tolerance. Use hip mobilizations, hip long axis traction, hip/lumbar ROM as tolerated, trial traction as needed for pain control.    PT Home Exercise Plan  [facility] ab set with exhale, bridge    Consulted and Agree with Plan of Care  Patient       Patient will benefit from skilled therapeutic intervention in order to improve the following deficits and impairments:  Abnormal gait, Decreased endurance, Hypomobility,  Obesity, Decreased activity tolerance, Decreased mobility, Difficulty walking, Decreased balance, Decreased strength, Pain, Decreased range of motion, Improper body mechanics  Visit Diagnosis: Pain in left hip  Pain in right hip  Difficulty in walking, not elsewhere classified     Problem List Patient Active Problem List   Diagnosis Date Noted  . Primary osteoarthritis of both hips 07/10/2019  . Diabetes mellitus ([place] 07/10/2019  . Vitamin D deficiency 04/13/2019  . Depression 04/13/2019  . Class 3 severe obesity with serious comorbidity and body mass index (BMI) of 40.0 to 44.9 in adult ([facility] 04/13/2019  . 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 ([place] 08/01/2018  . Cellulitis 07/25/2018  . Erectile dysfunction 01/26/2018  . Stage 3 chronic kidney disease 07/20/2017  . Persistent proteinuria 07/20/2017  . History of cerebrovascular accident 07/20/2017  . Gastroesophageal reflux disease 07/20/2017  . Prediabetes 03/29/2017  . Chronic back pain greater than 3 months duration 02/11/2017  . Narcotic dependence ([place] 02/11/2017  . Benzodiazepine dependence ([place] 02/11/2017  . Gastric bypass status for obesity 02/11/2017  . COLONIC POLYPS, ADENOMATOUS 08/03/2008  . Type 2 diabetes mellitus with diabetic nephropathy ([place] 08/03/2008  . Hyperlipidemia 08/03/2008  . CVA 08/03/2008  . HIATAL HERNIA 08/03/2008  . SLEEP APNEA 08/03/2008  . ALLERGY 08/03/2008   5:11 PM, 07/18/19 CJosue HectorPT DPT  Physical Therapist with [facility] [phone]  [facility] [address]Phone: [phone]  Fax:  [phone] Name: TMADDYX WIECKMRN: [id number] of Birth: 511/25/1972

## 2019-07-19 ENCOUNTER — Other Ambulatory Visit (INDEPENDENT_AMBULATORY_CARE_PROVIDER_SITE_OTHER): Payer: Self-pay | Admitting: Family Medicine

## 2019-07-19 DIAGNOSIS — E559 Vitamin D deficiency, unspecified: Secondary | ICD-10-CM

## 2019-07-20 ENCOUNTER — Encounter (INDEPENDENT_AMBULATORY_CARE_PROVIDER_SITE_OTHER): Payer: Self-pay | Admitting: *Deleted

## 2019-07-20 ENCOUNTER — Ambulatory Visit (HOSPITAL_COMMUNITY): Payer: Medicaid Other | Admitting: Physical Therapy

## 2019-07-20 ENCOUNTER — Telehealth (HOSPITAL_COMMUNITY): Payer: Self-pay | Admitting: Physical Therapy

## 2019-07-20 NOTE — Telephone Encounter (Signed)
L/m to cx and offered to r/s for tomorrow due to waiting on Medicaid approval. NF 8:56am

## 2019-07-21 ENCOUNTER — Ambulatory Visit (HOSPITAL_COMMUNITY): Payer: Medicaid Other

## 2019-07-21 ENCOUNTER — Other Ambulatory Visit: Payer: Self-pay

## 2019-07-21 ENCOUNTER — Encounter (HOSPITAL_COMMUNITY): Payer: Self-pay

## 2019-07-21 DIAGNOSIS — M25551 Pain in right hip: Secondary | ICD-10-CM | POA: Diagnosis not present

## 2019-07-21 DIAGNOSIS — M25552 Pain in left hip: Secondary | ICD-10-CM

## 2019-07-21 DIAGNOSIS — R262 Difficulty in walking, not elsewhere classified: Secondary | ICD-10-CM

## 2019-07-21 NOTE — Therapy (Signed)
[place], [place], [address] Phone: [phone]   Fax:  [phone]  Physical Therapy Treatment  Patient Details  Name: [name] MRN: [id number] Date of Birth: [birth year] Referring Provider (PT): [name]   Encounter Date: 07/21/2019  PT End of Session - 07/21/19 1142    Visit Number  4    Number of Visits  12    Date for PT Re-Evaluation  08/16/19    Authorization Type  Medicaid approved authorization 8 visits from 2/26-->08/17/19    Authorization Time Period  POC dates 07/05/19 to 08/16/2019    Authorization - Visit Number  1    Authorization - Number of Visits  8    Progress Note Due on Visit  12    PT Start Time  1134    PT Stop Time  1215    PT Time Calculation (min)  41 min    Activity Tolerance  Patient tolerated treatment well;No increased pain    Behavior During Therapy  WFL for tasks assessed/performed       Past Medical History:  Diagnosis Date  . Anemia   . Arthritis    "hips" (01/15/2015)  . Blood transfusion without reported diagnosis   . Chronic lower back pain   . Colon polyps   . Depression    denies  . Diabetes mellitus    diet controlled- no meds since wt loss  . Dysrhythmia    s/p surgery bariatric- cardioversion  . GERD (gastroesophageal reflux disease)   . Hip pain   . History of gout   . Hyperlipidemia   . Hypertension   . Joint pain   . Neuromuscular disorder ([place])   . PONV (postoperative nausea and vomiting)   . Pre-diabetes   . Sleep apnea    no longer have to use cpap since wt loss  . Stage 3 chronic kidney disease 01/26/2018  . Stroke [facility]) 2006   denies residual on 01/15/2015    Past Surgical History:  Procedure Laterality Date  . BARIATRIC SURGERY  2010   in [place]  . CARDIOVERSION    . COLONOSCOPY N/A 08/02/2014   Procedure: COLONOSCOPY;  Surgeon: Rogene Houston, MD;  [facility];  Service: Endoscopy;  Laterality: N/A;  210 - moved to  3/10 @ 11:55 - Ann notified pt  . ESOPHAGOGASTRODUODENOSCOPY    . HERNIA REPAIR    . LIPOSUCTION    . PANNICULECTOMY  01/14/2015  . PANNICULECTOMY N/A 01/14/2015   Procedure:  TOTAL PANNICULECTOMY WTH LIPOSUCTION OF SIDES;  Surgeon: Cristine Polio, MD;  Location: [place];  Service: Plastics;  Laterality: N/A;  . TONSILLECTOMY    . VENTRAL HERNIA REPAIR  01/14/2015  . VENTRAL HERNIA REPAIR N/A 01/14/2015   Procedure: HERNIA REPAIR VENTRAL ADULT AND MUSCLE REPAIR;  Surgeon: Cristine Polio, MD;  Location: [place];  Service: Plastics;  Laterality: N/A;    There were no vitals filed for this visit.  Subjective Assessment - 07/21/19 1139    Subjective  Pt stated he can the weather is coming in joints, windy outside.  Current pain scale 4-5/10 dull achey pain Bil hips    Pertinent History  obesity, depression, advanced hip OA, history of morbid obesity (was 600 pounds), history of low back pain    Patient Stated Goals  to have less pain, to return to work    Currently in Pain?  Yes    Pain Score  5  Pain Location  Hip    Pain Orientation  Right;Left    Pain Descriptors / Indicators  Aching;Dull    Pain Type  Chronic pain    Pain Onset  More than a month ago    Pain Frequency  Constant    Aggravating Factors   walking, standing    Pain Relieving Factors  sitting, rest    Effect of Pain on Daily Activities  limits                       [facility] Adult PT Treatment/Exercise - 07/21/19 0001      Exercises   Exercises  Knee/Hip      Knee/Hip Exercises: Supine   Bridges  2 sets;15 reps    Bridges Limitations  belt around thighs to maintain proper positioning    Single Leg Bridge  --   traction on ball x 5 min with rotation   Knee Flexion Limitations  clam supine for stability 10x eacg    Other Supine Knee/Hip Exercises  ab set with exhale, x20 reps with verbal cues for form    Other Supine Knee/Hip Exercises  isometric glute activation with belt around thighs for support,  x20 reps with 3 sec hold, cues to avoid breath holding; hooklying clams with pink belt around thighs, x15 reps      Manual Therapy   Manual Therapy  Manual Traction    Manual therapy comments  completed seperate from rest of treatment interventions    Manual Traction  8 min static with sheets               PT Short Term Goals - 07/18/19 1544      PT SHORT TERM GOAL #1   Title  Patient will be able to transition from sit to stand and stand to sit in under 6 seconds to demonstrate improved transitional mobility.    Baseline  2/10 8.5 seconds with B UE use and walking stick for assist; 07/18/19: 5.6 sec    Time  3    Period  Weeks    Status  Achieved    Target Date  07/27/19      PT SHORT TERM GOAL #2   Title  Patient will be independent in HEP to improve functional outcomes.    Baseline  07/18/19: patient reports complaince with HEP with no issues, says exercises have been helping    Time  3    Period  Weeks    Status  Achieved    Target Date  07/27/19      PT SHORT TERM GOAL #3   Title  Patient will be able to ambulate at least 226 feet in 2 minutes with required assistive device to demonstrate improved walking endurance.    Time  3    Period  Weeks    Status  Achieved    Target Date  07/27/19        PT Long Term Goals - 07/18/19 1546      PT LONG TERM GOAL #1   Title  Patient will report at least 50% improvement in overall symptoms to improve QOL.    Baseline  07/18/19: 30% reported    Time  6    Period  Weeks    Status  On-going      PT LONG TERM GOAL #2   Title  Patient will be able to ambulate at least 275 feet in 2 minutes with required assistive device to  demonstrate improved walking endurance.    Time  6    Period  Weeks    Status  On-going      PT LONG TERM GOAL #3   Title  Patient will be able to perform 5x sit to stand test in under 50 seconds to demonstrate improved functional endurance.    Baseline  07/18/19: 19.5 seconds, with heavy use of arms  for initial lift from chair    Time  6    Period  Weeks    Status  On-going            Plan - 07/21/19 1750    Clinical Impression Statement  Pt limited by decreased hip mobility and pain today, stated he can feel the weather in his joints.  Pt reports relief and improved posture and mobility following manual traction for prolonged period of time.  Continued wiht core and proximal strengthening with verbal and tactile cueing to improve form and proper musculature activation.  Improved posture and reports of pain reduced to 2-3/10 at EOS.    Personal Factors and Comorbidities  Comorbidity 1;Comorbidity 2;Finances    Comorbidities  depression, obesity, severe B hip OA, hx lumbar pain    Examination-Activity Limitations  Bed Mobility;Bathing;Sit;Squat;Bend;Stairs;Stand;Carry;Transfers;Hygiene/Grooming;Lift;Locomotion Level;Reach Overhead    Examination-Participation Restrictions  Meal Prep;Laundry;Yard Work;Community Activity;Cleaning    Stability/Clinical Decision Making  Evolving/Moderate complexity    Clinical Decision Making  Moderate    Rehab Potential  Fair    PT Frequency  2x / week    PT Duration  6 weeks    PT Treatment/Interventions  ADLs/Self Care Home Management;Aquatic Therapy;Biofeedback;Cryotherapy;Electrical Stimulation;Moist Heat;Traction;Iontophoresis 4mg /ml Dexamethasone;Balance training;Therapeutic exercise;Manual techniques;Therapeutic activities;Functional mobility training;Stair training;Gait training;Neuromuscular re-education;Patient/family education;Dry needling;Passive range of motion;Joint Manipulations    PT Next Visit Plan  Continue core strengthening and decreased WB exercise for pain tolerance. Use hip mobilizations, hip long axis traction, hip/lumbar ROM as tolerated, trial traction as needed for pain control.    PT Home Exercise Plan  discussed return [facility] pool [facility]; 2/16: ab set with exhale, bridge       Patient will benefit from skilled therapeutic  intervention in order to improve the following deficits and impairments:  Abnormal gait, Decreased endurance, Hypomobility, Obesity, Decreased activity tolerance, Decreased mobility, Difficulty walking, Decreased balance, Decreased strength, Pain, Decreased range of motion, Improper body mechanics  Visit Diagnosis: Pain in left hip  Pain in right hip  Difficulty in walking, not elsewhere classified     Problem List Patient Active Problem List   Diagnosis Date Noted  . Primary osteoarthritis of both hips 07/10/2019  . Diabetes mellitus ([place]) 07/10/2019  . Vitamin D deficiency 04/13/2019  . Depression 04/13/2019  . Class 3 severe obesity with serious comorbidity and body mass index (BMI) of 40.0 to 44.9 in adult [facility]) 04/13/2019  . 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 ([place]) 08/01/2018  . Cellulitis 07/25/2018  . Erectile dysfunction 01/26/2018  . Stage 3 chronic kidney disease 07/20/2017  . Persistent proteinuria 07/20/2017  . History of cerebrovascular accident 07/20/2017  . Gastroesophageal reflux disease 07/20/2017  . Prediabetes 03/29/2017  . Chronic back pain greater than 3 months duration 02/11/2017  . Narcotic dependence ([place]) 02/11/2017  . Benzodiazepine dependence ([place]) 02/11/2017  . Gastric bypass status for obesity 02/11/2017  . COLONIC POLYPS, ADENOMATOUS 08/03/2008  . Type 2 diabetes mellitus with diabetic nephropathy ([place]) 08/03/2008  . Hyperlipidemia 08/03/2008  . CVA 08/03/2008  . HIATAL HERNIA 08/03/2008  . SLEEP APNEA 08/03/2008  .  ALLERGY 08/03/2008   Ihor Austin, LPTA/CLT; CBIS [phone]  Aldona Lento 07/21/2019, 6:01 PM  [place] [address] [place], [place], [address] Phone: [phone]   Fax:  [phone]  Name: DETERRIUS HEINS MRN: [id number] Date of Birth: Aug 28, 1970

## 2019-07-25 ENCOUNTER — Ambulatory Visit (HOSPITAL_COMMUNITY): Payer: Medicaid Other | Attending: Family Medicine | Admitting: Physical Therapy

## 2019-07-25 ENCOUNTER — Other Ambulatory Visit: Payer: Self-pay

## 2019-07-25 ENCOUNTER — Encounter (HOSPITAL_COMMUNITY): Payer: Self-pay | Admitting: Physical Therapy

## 2019-07-25 DIAGNOSIS — R262 Difficulty in walking, not elsewhere classified: Secondary | ICD-10-CM | POA: Diagnosis not present

## 2019-07-25 DIAGNOSIS — M25552 Pain in left hip: Secondary | ICD-10-CM

## 2019-07-25 DIAGNOSIS — M25551 Pain in right hip: Secondary | ICD-10-CM | POA: Insufficient documentation

## 2019-07-25 NOTE — Therapy (Signed)
[place], [place], [address] Phone: [phone]   Fax:  [phone]  Physical Therapy Treatment  Patient Details  Name: [name] MRN: [id number] Date of Birth: [birth year] Referring Provider (PT): [name]   Encounter Date: 07/25/2019  PT End of Session - 07/25/19 1521    Visit Number  5    Number of Visits  12    Date for PT Re-Evaluation  08/16/19    Authorization Type  Medicaid approved authorization 8 visits from 2/26-->08/17/19    Authorization Time Period  POC dates 07/05/19 to 08/16/2019    Authorization - Visit Number  2    Authorization - Number of Visits  8    Progress Note Due on Visit  12    PT Start Time  1520    PT Stop Time  1600    PT Time Calculation (min)  40 min    Activity Tolerance  Patient tolerated treatment well;Patient limited by fatigue    Behavior During Therapy  [facility] for tasks assessed/performed       Past Medical History:  Diagnosis Date  . Anemia   . Arthritis    "hips" (01/15/2015)  . Blood transfusion without reported diagnosis   . Chronic lower back pain   . Colon polyps   . Depression    denies  . Diabetes mellitus    diet controlled- no meds since wt loss  . Dysrhythmia    s/p surgery bariatric- cardioversion  . GERD (gastroesophageal reflux disease)   . Hip pain   . History of gout   . Hyperlipidemia   . Hypertension   . Joint pain   . Neuromuscular disorder ([place])   . PONV (postoperative nausea and vomiting)   . Pre-diabetes   . Sleep apnea    no longer have to use cpap since wt loss  . Stage 3 chronic kidney disease 01/26/2018  . Stroke [facility]) 2006   denies residual on 01/15/2015    Past Surgical History:  Procedure Laterality Date  . BARIATRIC SURGERY  2010   in [place]  . CARDIOVERSION    . COLONOSCOPY N/A 08/02/2014   Procedure: COLONOSCOPY;  Surgeon: Rogene Houston, MD;  [facility];  Service: Endoscopy;  Laterality: N/A;  210 -  moved to 3/10 @ 11:55 - Ann notified pt  . ESOPHAGOGASTRODUODENOSCOPY    . HERNIA REPAIR    . LIPOSUCTION    . PANNICULECTOMY  01/14/2015  . PANNICULECTOMY N/A 01/14/2015   Procedure:  TOTAL PANNICULECTOMY WTH LIPOSUCTION OF SIDES;  Surgeon: Cristine Polio, MD;  Location: [place];  Service: Plastics;  Laterality: N/A;  . TONSILLECTOMY    . VENTRAL HERNIA REPAIR  01/14/2015  . VENTRAL HERNIA REPAIR N/A 01/14/2015   Procedure: HERNIA REPAIR VENTRAL ADULT AND MUSCLE REPAIR;  Surgeon: Cristine Polio, MD;  Location: [place];  Service: Plastics;  Laterality: N/A;    There were no vitals filed for this visit.  Subjective Assessment - 07/25/19 1526    Subjective  Patient says he is sore today, did a lot of standing. Still having trouble getting out of chairs but reports no pain currently.    Pertinent History  obesity, depression, advanced hip OA, history of morbid obesity (was 600 pounds), history of low back pain    Patient Stated Goals  to have less pain, to return to work    Currently in Pain?  No/denies    Pain Onset  More than a month ago                       [facility] Adult PT Treatment/Exercise - 07/25/19 0001      Knee/Hip Exercises: Standing   Gait Training  226 feet with SPC, cues for step length/ hip flexion for advancing BLEs      Knee/Hip Exercises: Seated   Sit to Sand  5 reps;with UE support   from low mat      Knee/Hip Exercises: Supine   Hip Adduction Isometric  Both;20 reps    Hip Adduction Isometric Limitations  3 sec hold with kickball    Bridges  Both;2 sets;10 reps    Other Supine Knee/Hip Exercises  ab set with exhale, x20 reps; ab marching 2 x 10       Knee/Hip Exercises: Sidelying   Clams  2 x 10 each       Manual Therapy   Manual Therapy  Manual Traction    Manual therapy comments  completed seperate from rest of treatment interventions    Manual Traction  manual LAD to bilateral LEs, 3 x 60" grade II oscillations, 3 x 30" static holds/ grade II                PT Short Term Goals - 07/18/19 1544      PT SHORT TERM GOAL #1   Title  Patient will be able to transition from sit to stand and stand to sit in under 6 seconds to demonstrate improved transitional mobility.    Baseline  2/10 8.5 seconds with B UE use and walking stick for assist; 07/18/19: 5.6 sec    Time  3    Period  Weeks    Status  Achieved    Target Date  07/27/19      PT SHORT TERM GOAL #2   Title  Patient will be independent in HEP to improve functional outcomes.    Baseline  07/18/19: patient reports complaince with HEP with no issues, says exercises have been helping    Time  3    Period  Weeks    Status  Achieved    Target Date  07/27/19      PT SHORT TERM GOAL #3   Title  Patient will be able to ambulate at least 226 feet in 2 minutes with required assistive device to demonstrate improved walking endurance.    Time  3    Period  Weeks    Status  Achieved    Target Date  07/27/19        PT Long Term Goals - 07/18/19 1546      PT LONG TERM GOAL #1   Title  Patient will report at least 50% improvement in overall symptoms to improve QOL.    Baseline  07/18/19: 30% reported    Time  6    Period  Weeks    Status  On-going      PT LONG TERM GOAL #2   Title  Patient will be able to ambulate at least 275 feet in 2 minutes with required assistive device to demonstrate improved walking endurance.    Time  6    Period  Weeks    Status  On-going      PT LONG TERM GOAL #3   Title  Patient will be able to perform 5x sit to stand test in under 50 seconds to demonstrate improved functional endurance.  Baseline  07/18/19: 19.5 seconds, with heavy use of arms for initial lift from chair    Time  6    Period  Weeks    Status  On-going            Plan - 07/25/19 1617    Clinical Impression Statement  Patient tolerated session well overall today. Was able to progress core strengthening and hip mobility activity with supine marching with ab  brace. Patient cued on performing exercise through pain free ROM with clamshells. Patient continues to demo strength and mobility deficits, was only able to perform 5 sit to stands from elevated surface with UE assist. Patient able to ambulate 226 feet with SPC but demos decreased step length bilaterally and poor ground clearance most notably on RT. Patient cued on increased step length and hip flexion for higher steps to improved ground clearance. Patient did note decreased stiffness post manual treatment    Personal Factors and Comorbidities  Comorbidity 1;Comorbidity 2;Finances    Comorbidities  depression, obesity, severe B hip OA, hx lumbar pain    Examination-Activity Limitations  Bed Mobility;Bathing;Sit;Squat;Bend;Stairs;Stand;Carry;Transfers;Hygiene/Grooming;Lift;Locomotion Level;Reach Overhead    Examination-Participation Restrictions  Meal Prep;Laundry;Yard Work;Community Activity;Cleaning    Stability/Clinical Decision Making  Evolving/Moderate complexity    Rehab Potential  Fair    PT Frequency  2x / week    PT Duration  6 weeks    PT Treatment/Interventions  ADLs/Self Care Home Management;Aquatic Therapy;Biofeedback;Cryotherapy;Electrical Stimulation;Moist Heat;Traction;Iontophoresis 4mg /ml Dexamethasone;Balance training;Therapeutic exercise;Manual techniques;Therapeutic activities;Functional mobility training;Stair training;Gait training;Neuromuscular re-education;Patient/family education;Dry needling;Passive range of motion;Joint Manipulations    PT Next Visit Plan  Continue core strengthening and decreased WB exercise for pain tolerance. Use hip mobilizations, hip long axis traction, hip/lumbar ROM as tolerated, trial traction as needed for pain control. Add nustep warmup next visit for improved hip mobility.    PT Home Exercise Plan  discussed return to pool at [facility]; 2/16: ab set with exhale, bridge    Consulted and Agree with Plan of Care  Patient       Patient will benefit from  skilled therapeutic intervention in order to improve the following deficits and impairments:  Abnormal gait, Decreased endurance, Hypomobility, Obesity, Decreased activity tolerance, Decreased mobility, Difficulty walking, Decreased balance, Decreased strength, Pain, Decreased range of motion, Improper body mechanics  Visit Diagnosis: Pain in left hip  Pain in right hip  Difficulty in walking, not elsewhere classified     Problem List Patient Active Problem List   Diagnosis Date Noted  . Primary osteoarthritis of both hips 07/10/2019  . Diabetes mellitus ([place]) 07/10/2019  . Vitamin D deficiency 04/13/2019  . Depression 04/13/2019  . Class 3 severe obesity with serious comorbidity and body mass index (BMI) of 40.0 to 44.9 in adult [facility]) 04/13/2019  . 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 ([place]) 08/01/2018  . Cellulitis 07/25/2018  . Erectile dysfunction 01/26/2018  . Stage 3 chronic kidney disease 07/20/2017  . Persistent proteinuria 07/20/2017  . History of cerebrovascular accident 07/20/2017  . Gastroesophageal reflux disease 07/20/2017  . Prediabetes 03/29/2017  . Chronic back pain greater than 3 months duration 02/11/2017  . Narcotic dependence ([place]) 02/11/2017  . Benzodiazepine dependence ([place]) 02/11/2017  . Gastric bypass status for obesity 02/11/2017  . COLONIC POLYPS, ADENOMATOUS 08/03/2008  . Type 2 diabetes mellitus with diabetic nephropathy ([place]) 08/03/2008  . Hyperlipidemia 08/03/2008  . CVA 08/03/2008  . HIATAL HERNIA 08/03/2008  . SLEEP APNEA 08/03/2008  . ALLERGY 08/03/2008   4:20 PM, 07/25/19 Josue Hector  PT DPT  Physical Therapist with [facility]  [phone] [place] [address] [place], [place], [address] Phone: [phone]   Fax:  [phone]  Name: HART BART MRN: [id number] Date of Birth: 08/02/1970

## 2019-07-26 ENCOUNTER — Other Ambulatory Visit (INDEPENDENT_AMBULATORY_CARE_PROVIDER_SITE_OTHER): Payer: Self-pay | Admitting: Family Medicine

## 2019-07-26 DIAGNOSIS — E119 Type 2 diabetes mellitus without complications: Secondary | ICD-10-CM

## 2019-07-26 DIAGNOSIS — E559 Vitamin D deficiency, unspecified: Secondary | ICD-10-CM

## 2019-07-27 ENCOUNTER — Other Ambulatory Visit: Payer: Self-pay

## 2019-07-27 ENCOUNTER — Ambulatory Visit (HOSPITAL_COMMUNITY): Payer: Medicaid Other | Admitting: Physical Therapy

## 2019-07-27 ENCOUNTER — Encounter (HOSPITAL_COMMUNITY): Payer: Self-pay | Admitting: Physical Therapy

## 2019-07-27 DIAGNOSIS — M25551 Pain in right hip: Secondary | ICD-10-CM

## 2019-07-27 DIAGNOSIS — R262 Difficulty in walking, not elsewhere classified: Secondary | ICD-10-CM

## 2019-07-27 DIAGNOSIS — M25552 Pain in left hip: Secondary | ICD-10-CM | POA: Diagnosis not present

## 2019-07-27 NOTE — Therapy (Signed)
[place], [place], [address] Phone: [phone]   Fax:  [phone]  Physical Therapy Treatment  Patient Details  Name: [name] MRN: [id number] Date of Birth: 05/18/1971 Referring Provider (PT): [name]   Encounter Date: 07/27/2019  PT End of Session - 07/27/19 1654    Visit Number  6    Number of Visits  12    Date for PT Re-Evaluation  08/16/19    Authorization Type  Medicaid approved authorization 8 visits from 2/26-->08/17/19    Authorization Time Period  POC dates 07/05/19 to 08/16/2019    Authorization - Visit Number  3    Authorization - Number of Visits  8    Progress Note Due on Visit  12    PT Start Time  [id number]    PT Stop Time  1730    PT Time Calculation (min)  40 min    Activity Tolerance  Patient tolerated treatment well    Behavior During Therapy  [facility] for tasks assessed/performed       Past Medical History:  Diagnosis Date  . Anemia   . Arthritis    "hips" (01/15/2015)  . Blood transfusion without reported diagnosis   . Chronic lower back pain   . Colon polyps   . Depression    denies  . Diabetes mellitus    diet controlled- no meds since wt loss  . Dysrhythmia    s/p surgery bariatric- cardioversion  . GERD (gastroesophageal reflux disease)   . Hip pain   . History of gout   . Hyperlipidemia   . Hypertension   . Joint pain   . Neuromuscular disorder ([place])   . PONV (postoperative nausea and vomiting)   . Pre-diabetes   . Sleep apnea    no longer have to use cpap since wt loss  . Stage 3 chronic kidney disease 01/26/2018  . Stroke [facility]) 2006   denies residual on 01/15/2015    Past Surgical History:  Procedure Laterality Date  . BARIATRIC SURGERY  2010   in [place]  . CARDIOVERSION    . COLONOSCOPY N/A 08/02/2014   Procedure: COLONOSCOPY;  Surgeon: Rogene Houston, MD;  [facility];  Service: Endoscopy;  Laterality: N/A;  210 - moved to 3/10 @ 11:55 - Ann  notified pt  . ESOPHAGOGASTRODUODENOSCOPY    . HERNIA REPAIR    . LIPOSUCTION    . PANNICULECTOMY  01/14/2015  . PANNICULECTOMY N/A 01/14/2015   Procedure:  TOTAL PANNICULECTOMY WTH LIPOSUCTION OF SIDES;  Surgeon: Cristine Polio, MD;  Location: [place];  Service: Plastics;  Laterality: N/A;  . TONSILLECTOMY    . VENTRAL HERNIA REPAIR  01/14/2015  . VENTRAL HERNIA REPAIR N/A 01/14/2015   Procedure: HERNIA REPAIR VENTRAL ADULT AND MUSCLE REPAIR;  Surgeon: Cristine Polio, MD;  Location: [place];  Service: Plastics;  Laterality: N/A;    There were no vitals filed for this visit.  Subjective Assessment - 07/27/19 1652    Subjective  Patient says he is doing better today, has a little pain not bad. Felt pretty good after last session. Lt hip is bothering him a little "about a 4".    Pertinent History  obesity, depression, advanced hip OA, history of morbid obesity (was 600 pounds), history of low back pain    Patient Stated Goals  to have less pain, to return to work    Currently in Pain?  Yes  Pain Score  4     Pain Location  Hip    Pain Orientation  Left    Pain Descriptors / Indicators  Aching;Dull    Pain Onset  More than a month ago                       [facility] Adult PT Treatment/Exercise - 07/27/19 0001      Knee/Hip Exercises: Stretches   Knee: Self-Stretch to increase Flexion  Both;5 reps;10 seconds    Knee: Self-Stretch Limitations  at second step      Knee/Hip Exercises: Aerobic   Nustep  5 min warmup for mobility       Knee/Hip Exercises: Standing   Forward Step Up  Both;1 set;10 reps;Hand Hold: 2;Step Height: 4"    Functional Squat  2 sets;10 reps    Functional Squat Limitations  mini squats to high mat for depth cues    Gait Training  150 feet with SPC, cues for step length     Other Standing Knee Exercises  sidestepping at mat x10       Knee/Hip Exercises: Supine   Hip Adduction Isometric  Both;20 reps    Hip Adduction Isometric Limitations  3 sec  hold with kickball    Bridges  Both;2 sets;10 reps    Other Supine Knee/Hip Exercises  ab marching 2 x 10, cues for TA brace and avoiding valsalva       Manual Therapy   Manual Therapy  Manual Traction    Manual therapy comments  completed seperate from rest of treatment interventions    Manual Traction  manual LAD to bilateral LEs, 3 x 60" grade II oscillations, 3 x 30" static holds/ grade II               PT Short Term Goals - 07/18/19 1544      PT SHORT TERM GOAL #1   Title  Patient will be able to transition from sit to stand and stand to sit in under 6 seconds to demonstrate improved transitional mobility.    Baseline  2/10 8.5 seconds with B UE use and walking stick for assist; 07/18/19: 5.6 sec    Time  3    Period  Weeks    Status  Achieved    Target Date  07/27/19      PT SHORT TERM GOAL #2   Title  Patient will be independent in HEP to improve functional outcomes.    Baseline  07/18/19: patient reports complaince with HEP with no issues, says exercises have been helping    Time  3    Period  Weeks    Status  Achieved    Target Date  07/27/19      PT SHORT TERM GOAL #3   Title  Patient will be able to ambulate at least 226 feet in 2 minutes with required assistive device to demonstrate improved walking endurance.    Time  3    Period  Weeks    Status  Achieved    Target Date  07/27/19        PT Long Term Goals - 07/18/19 1546      PT LONG TERM GOAL #1   Title  Patient will report at least 50% improvement in overall symptoms to improve QOL.    Baseline  07/18/19: 30% reported    Time  6    Period  Weeks    Status  On-going  PT LONG TERM GOAL #2   Title  Patient will be able to ambulate at least 275 feet in 2 minutes with required assistive device to demonstrate improved walking endurance.    Time  6    Period  Weeks    Status  On-going      PT LONG TERM GOAL #3   Title  Patient will be able to perform 5x sit to stand test in under 50 seconds to  demonstrate improved functional endurance.    Baseline  07/18/19: 19.5 seconds, with heavy use of arms for initial lift from chair    Time  6    Period  Weeks    Status  On-going            Plan - 07/27/19 1841    Clinical Impression Statement  Added Nustep for warmup today to improve synovial fluid dispersed within hip joints and increase pain free hip mobility. Patient did note improvement in ambulation following. Was able to progress strengthening to forward step up on 4 inch step. Patient limited with LE elevation initially so knee drive stretching added from 2nd step at stair well. Patient noted improved ability to lift LEs afterward and increased ease with stair ambulation. Patient continues to demo decreases stride length during ambulation using SPC, patient did note some improvement in decreased pain during gait and able to take slightly larger steps post manual treatment.    Personal Factors and Comorbidities  Comorbidity 1;Comorbidity 2;Finances    Comorbidities  depression, obesity, severe B hip OA, hx lumbar pain    Examination-Activity Limitations  Bed Mobility;Bathing;Sit;Squat;Bend;Stairs;Stand;Carry;Transfers;Hygiene/Grooming;Lift;Locomotion Level;Reach Overhead    Examination-Participation Restrictions  Meal Prep;Laundry;Yard Work;Community Activity;Cleaning    Stability/Clinical Decision Making  Evolving/Moderate complexity    Rehab Potential  Fair    PT Frequency  2x / week    PT Duration  6 weeks    PT Treatment/Interventions  ADLs/Self Care Home Management;Aquatic Therapy;Biofeedback;Cryotherapy;Electrical Stimulation;Moist Heat;Traction;Iontophoresis 4mg /ml Dexamethasone;Balance training;Therapeutic exercise;Manual techniques;Therapeutic activities;Functional mobility training;Stair training;Gait training;Neuromuscular re-education;Patient/family education;Dry needling;Passive range of motion;Joint Manipulations    PT Next Visit Plan  Continue core strengthening and  decreased WB exercise for pain tolerance. Use hip mobilizations, hip long axis traction, hip/lumbar ROM as tolerated, trial traction as needed for pain control.    PT Home Exercise Plan  discussed return to pool at [facility]; 2/16: ab set with exhale, bridge    Consulted and Agree with Plan of Care  Patient       Patient will benefit from skilled therapeutic intervention in order to improve the following deficits and impairments:  Abnormal gait, Decreased endurance, Hypomobility, Obesity, Decreased activity tolerance, Decreased mobility, Difficulty walking, Decreased balance, Decreased strength, Pain, Decreased range of motion, Improper body mechanics  Visit Diagnosis: Pain in left hip  Pain in right hip  Difficulty in walking, not elsewhere classified     Problem List Patient Active Problem List   Diagnosis Date Noted  . Primary osteoarthritis of both hips 07/10/2019  . Diabetes mellitus ([place]) 07/10/2019  . Vitamin D deficiency 04/13/2019  . Depression 04/13/2019  . Class 3 severe obesity with serious comorbidity and body mass index (BMI) of 40.0 to 44.9 in adult [facility]) 04/13/2019  . 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 ([place]) 08/01/2018  . Cellulitis 07/25/2018  . Erectile dysfunction 01/26/2018  . Stage 3 chronic kidney disease 07/20/2017  . Persistent proteinuria 07/20/2017  . History of cerebrovascular accident 07/20/2017  . Gastroesophageal reflux disease 07/20/2017  . Prediabetes 03/29/2017  .  Chronic back pain greater than 3 months duration 02/11/2017  . Narcotic dependence ([place]) 02/11/2017  . Benzodiazepine dependence ([place]) 02/11/2017  . Gastric bypass status for obesity 02/11/2017  . COLONIC POLYPS, ADENOMATOUS 08/03/2008  . Type 2 diabetes mellitus with diabetic nephropathy ([place]) 08/03/2008  . Hyperlipidemia 08/03/2008  . CVA 08/03/2008  . HIATAL HERNIA 08/03/2008  . SLEEP APNEA 08/03/2008  . ALLERGY 08/03/2008   6:43 PM, 07/27/19 Josue Hector PT DPT   Physical Therapist with [facility]  [phone] [place] [address] [place], [place], [address] Phone: [phone]   Fax:  [phone]  Name: [name] MRN: [id number] Date of Birth: 13-Mar-1971

## 2019-08-01 ENCOUNTER — Ambulatory Visit (HOSPITAL_COMMUNITY): Payer: Medicaid Other | Admitting: Physical Therapy

## 2019-08-02 ENCOUNTER — Encounter (HOSPITAL_COMMUNITY): Payer: Medicaid Other | Admitting: Physical Therapy

## 2019-08-02 ENCOUNTER — Telehealth (HOSPITAL_COMMUNITY): Payer: Self-pay | Admitting: Physical Therapy

## 2019-08-02 ENCOUNTER — Other Ambulatory Visit (INDEPENDENT_AMBULATORY_CARE_PROVIDER_SITE_OTHER): Payer: Self-pay | Admitting: Family Medicine

## 2019-08-02 DIAGNOSIS — E119 Type 2 diabetes mellitus without complications: Secondary | ICD-10-CM

## 2019-08-02 DIAGNOSIS — M160 Bilateral primary osteoarthritis of hip: Secondary | ICD-10-CM

## 2019-08-02 DIAGNOSIS — E559 Vitamin D deficiency, unspecified: Secondary | ICD-10-CM

## 2019-08-02 NOTE — Telephone Encounter (Signed)
Called patient about missed appointment today, no answer, left voicemail. Reminded patient of next appointment tomorrow at [facility] at [facility] for aquatics.   2:57 PM, 08/02/19 Josue Hector PT DPT  Physical Therapist with [facility]  [phone]

## 2019-08-02 NOTE — Telephone Encounter (Signed)
L/m  during our staff meeitng -car broke down on the way here

## 2019-08-03 ENCOUNTER — Other Ambulatory Visit: Payer: Self-pay

## 2019-08-03 ENCOUNTER — Ambulatory Visit (HOSPITAL_COMMUNITY): Payer: Medicaid Other | Admitting: Physical Therapy

## 2019-08-03 ENCOUNTER — Encounter (HOSPITAL_COMMUNITY): Payer: Self-pay | Admitting: Physical Therapy

## 2019-08-03 DIAGNOSIS — R262 Difficulty in walking, not elsewhere classified: Secondary | ICD-10-CM

## 2019-08-03 DIAGNOSIS — M25551 Pain in right hip: Secondary | ICD-10-CM | POA: Diagnosis not present

## 2019-08-03 DIAGNOSIS — M25552 Pain in left hip: Secondary | ICD-10-CM | POA: Diagnosis not present

## 2019-08-03 NOTE — Therapy (Signed)
[place], [place], [address] Phone: [phone]   Fax:  [phone]  Physical Therapy Treatment  Patient Details  Name: [name] MRN: [id number] Date of Birth: 21-May-1971 Referring Provider (PT): [name]   Encounter Date: 08/03/2019  PT End of Session - 08/03/19 1822    Visit Number  7    Number of Visits  12    Date for PT Re-Evaluation  08/16/19    Authorization Type  Medicaid approved authorization 8 visits from 2/26-->08/17/19    Authorization Time Period  POC dates 07/05/19 to 08/16/2019    Authorization - Visit Number  4    Authorization - Number of Visits  8    Progress Note Due on Visit  12    PT Start Time  1500    PT Stop Time  1545    PT Time Calculation (min)  45 min    Activity Tolerance  Patient tolerated treatment well;Patient limited by pain    Behavior During Therapy  [facility] for tasks assessed/performed       Past Medical History:  Diagnosis Date  . Anemia   . Arthritis    "hips" (01/15/2015)  . Blood transfusion without reported diagnosis   . Chronic lower back pain   . Colon polyps   . Depression    denies  . Diabetes mellitus    diet controlled- no meds since wt loss  . Dysrhythmia    s/p surgery bariatric- cardioversion  . GERD (gastroesophageal reflux disease)   . Hip pain   . History of gout   . Hyperlipidemia   . Hypertension   . Joint pain   . Neuromuscular disorder ([place])   . PONV (postoperative nausea and vomiting)   . Pre-diabetes   . Sleep apnea    no longer have to use cpap since wt loss  . Stage 3 chronic kidney disease 01/26/2018  . Stroke [facility]) 2006   denies residual on 01/15/2015    Past Surgical History:  Procedure Laterality Date  . BARIATRIC SURGERY  2010   in [place]  . CARDIOVERSION    . COLONOSCOPY N/A 08/02/2014   Procedure: COLONOSCOPY;  Surgeon: Rogene Houston, MD;  [facility];  Service: Endoscopy;  Laterality: N/A;  210 -  moved to 3/10 @ 11:55 - Ann notified pt  . ESOPHAGOGASTRODUODENOSCOPY    . HERNIA REPAIR    . LIPOSUCTION    . PANNICULECTOMY  01/14/2015  . PANNICULECTOMY N/A 01/14/2015   Procedure:  TOTAL PANNICULECTOMY WTH LIPOSUCTION OF SIDES;  Surgeon: Cristine Polio, MD;  Location: [place];  Service: Plastics;  Laterality: N/A;  . TONSILLECTOMY    . VENTRAL HERNIA REPAIR  01/14/2015  . VENTRAL HERNIA REPAIR N/A 01/14/2015   Procedure: HERNIA REPAIR VENTRAL ADULT AND MUSCLE REPAIR;  Surgeon: Cristine Polio, MD;  Location: [place];  Service: Plastics;  Laterality: N/A;    There were no vitals filed for this visit.  Subjective Assessment - 08/03/19 1821    Subjective  Patient says he has been doing ok. No new issues, just stiffness and pain with proloinged WB and walking. Says exercise has been going fine.    Pertinent History  obesity, depression, advanced hip OA, history of morbid obesity (was 600 pounds), history of low back pain    Patient Stated Goals  to have less pain, to return to work    Currently in Pain?  Yes    Pain  Score  4     Pain Location  Hip    Pain Orientation  Left    Pain Descriptors / Indicators  Aching    Pain Type  Chronic pain    Pain Onset  More than a month ago    Pain Frequency  Constant                   Adult Aquatic Therapy - 08/03/19 1829      Treatment   Gait  dynamic warmup, normal walk, high knee march, straight leg march, sidestepping 30' 2 RT each    Exercises  hamstring and quad stretch with noodle 3 x 30" each, knee drivers at step 5 x 10" each     Specific Exercises  Hip/Low Back    Hip/Low Back  squats at wall x20, heel raises x20, single leg leg press with noodle x20 each, step ups x8 LT, 5x RT                   PT Short Term Goals - 07/18/19 1544      PT SHORT TERM GOAL #1   Title  Patient will be able to transition from sit to stand and stand to sit in under 6 seconds to demonstrate improved transitional mobility.     Baseline  2/10 8.5 seconds with B UE use and walking stick for assist; 07/18/19: 5.6 sec    Time  3    Period  Weeks    Status  Achieved    Target Date  07/27/19      PT SHORT TERM GOAL #2   Title  Patient will be independent in HEP to improve functional outcomes.    Baseline  07/18/19: patient reports complaince with HEP with no issues, says exercises have been helping    Time  3    Period  Weeks    Status  Achieved    Target Date  07/27/19      PT SHORT TERM GOAL #3   Title  Patient will be able to ambulate at least 226 feet in 2 minutes with required assistive device to demonstrate improved walking endurance.    Time  3    Period  Weeks    Status  Achieved    Target Date  07/27/19        PT Long Term Goals - 07/18/19 1546      PT LONG TERM GOAL #1   Title  Patient will report at least 50% improvement in overall symptoms to improve QOL.    Baseline  07/18/19: 30% reported    Time  6    Period  Weeks    Status  On-going      PT LONG TERM GOAL #2   Title  Patient will be able to ambulate at least 275 feet in 2 minutes with required assistive device to demonstrate improved walking endurance.    Time  6    Period  Weeks    Status  On-going      PT LONG TERM GOAL #3   Title  Patient will be able to perform 5x sit to stand test in under 50 seconds to demonstrate improved functional endurance.    Baseline  07/18/19: 19.5 seconds, with heavy use of arms for initial lift from chair    Time  6    Period  Weeks    Status  On-going  Plan - 08/03/19 1823    Clinical Impression Statement  Initiated aquatic therapy this visit. Patient tolerated session well overall. Patient educated on benefit of aquatic therapy for progressing BLE strength and hip mobility in gravity reduced environment.  Progressed to active warmup for improved hip and LE mobility. Added on body weight strengthening exercise including squatting, hip and heel raises. Patient able to tolerate with  decreased pain. Focused end of session on hamstring and quad stretching with pool noodle. Patient had some difficulty and mild pain with step ups at end of session, but decreased with rest.  Patient educated on proper form and function of all exercise. Patient reports overall pain reduction from 5/10 to 3/10 post session.    Personal Factors and Comorbidities  Comorbidity 1;Comorbidity 2;Finances    Comorbidities  depression, obesity, severe B hip OA, hx lumbar pain    Examination-Activity Limitations  Bed Mobility;Bathing;Sit;Squat;Bend;Stairs;Stand;Carry;Transfers;Hygiene/Grooming;Lift;Locomotion Level;Reach Overhead    Examination-Participation Restrictions  Meal Prep;Laundry;Yard Work;Community Activity;Cleaning    Stability/Clinical Decision Making  Evolving/Moderate complexity    Rehab Potential  Fair    PT Frequency  2x / week    PT Duration  6 weeks    PT Treatment/Interventions  ADLs/Self Care Home Management;Aquatic Therapy;Biofeedback;Cryotherapy;Electrical Stimulation;Moist Heat;Traction;Iontophoresis 4mg /ml Dexamethasone;Balance training;Therapeutic exercise;Manual techniques;Therapeutic activities;Functional mobility training;Stair training;Gait training;Neuromuscular re-education;Patient/family education;Dry needling;Passive range of motion;Joint Manipulations    PT Next Visit Plan  Continue core strengthening and decreased WB exercise for pain tolerance. Use hip mobilizations, hip long axis traction, hip/lumbar ROM as tolerated, trial traction as needed for pain control.    PT Home Exercise Plan  discussed return to pool at [facility]; 2/16: ab set with exhale, bridge    Consulted and Agree with Plan of Care  Patient       Patient will benefit from skilled therapeutic intervention in order to improve the following deficits and impairments:  Abnormal gait, Decreased endurance, Hypomobility, Obesity, Decreased activity tolerance, Decreased mobility, Difficulty walking, Decreased balance,  Decreased strength, Pain, Decreased range of motion, Improper body mechanics  Visit Diagnosis: Pain in left hip  Pain in right hip  Difficulty in walking, not elsewhere classified     Problem List Patient Active Problem List   Diagnosis Date Noted  . Primary osteoarthritis of both hips 07/10/2019  . Diabetes mellitus ([place]) 07/10/2019  . Vitamin D deficiency 04/13/2019  . Depression 04/13/2019  . Class 3 severe obesity with serious comorbidity and body mass index (BMI) of 40.0 to 44.9 in adult [facility]) 04/13/2019  . 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 ([place]) 08/01/2018  . Cellulitis 07/25/2018  . Erectile dysfunction 01/26/2018  . Stage 3 chronic kidney disease 07/20/2017  . Persistent proteinuria 07/20/2017  . History of cerebrovascular accident 07/20/2017  . Gastroesophageal reflux disease 07/20/2017  . Prediabetes 03/29/2017  . Chronic back pain greater than 3 months duration 02/11/2017  . Narcotic dependence ([place]) 02/11/2017  . Benzodiazepine dependence ([place]) 02/11/2017  . Gastric bypass status for obesity 02/11/2017  . COLONIC POLYPS, ADENOMATOUS 08/03/2008  . Type 2 diabetes mellitus with diabetic nephropathy ([place]) 08/03/2008  . Hyperlipidemia 08/03/2008  . CVA 08/03/2008  . HIATAL HERNIA 08/03/2008  . SLEEP APNEA 08/03/2008  . ALLERGY 08/03/2008   6:35 PM, 08/03/19 Josue Hector PT DPT  Physical Therapist with [facility]  [phone] [place] [address] [place], [place], [address] Phone: [phone]   Fax:  [phone]  Name: JONE SCHURMAN MRN:  [id number] Date of Birth: 12/19/1970

## 2019-08-07 ENCOUNTER — Other Ambulatory Visit (INDEPENDENT_AMBULATORY_CARE_PROVIDER_SITE_OTHER): Payer: Self-pay | Admitting: Family Medicine

## 2019-08-07 DIAGNOSIS — E119 Type 2 diabetes mellitus without complications: Secondary | ICD-10-CM

## 2019-08-07 DIAGNOSIS — M160 Bilateral primary osteoarthritis of hip: Secondary | ICD-10-CM

## 2019-08-07 DIAGNOSIS — E559 Vitamin D deficiency, unspecified: Secondary | ICD-10-CM

## 2019-08-08 ENCOUNTER — Other Ambulatory Visit: Payer: Self-pay

## 2019-08-08 ENCOUNTER — Ambulatory Visit (HOSPITAL_COMMUNITY): Payer: Medicaid Other | Admitting: Physical Therapy

## 2019-08-08 ENCOUNTER — Encounter (HOSPITAL_COMMUNITY): Payer: Self-pay | Admitting: Physical Therapy

## 2019-08-08 DIAGNOSIS — M25551 Pain in right hip: Secondary | ICD-10-CM | POA: Diagnosis not present

## 2019-08-08 DIAGNOSIS — R262 Difficulty in walking, not elsewhere classified: Secondary | ICD-10-CM | POA: Diagnosis not present

## 2019-08-08 DIAGNOSIS — M25552 Pain in left hip: Secondary | ICD-10-CM

## 2019-08-08 NOTE — Therapy (Signed)
[place], [place], [address] Phone: [phone]   Fax:  [phone]  Physical Therapy Treatment  Patient Details  Name: [name] MRN: [id number] Date of Birth: 1970/07/12 Referring Provider (PT): [name]   Encounter Date: 08/08/2019  PT End of Session - 08/08/19 1438    Visit Number  8    Number of Visits  12    Date for PT Re-Evaluation  08/16/19    Authorization Type  Medicaid approved authorization 8 visits from 2/26-->08/17/19    Authorization Time Period  POC dates 07/05/19 to 08/16/2019    Authorization - Visit Number  5    Authorization - Number of Visits  8    Progress Note Due on Visit  12    PT Start Time  1432    PT Stop Time  1513    PT Time Calculation (min)  41 min    Activity Tolerance  Patient tolerated treatment well;Patient limited by pain    Behavior During Therapy  [facility] for tasks assessed/performed       Past Medical History:  Diagnosis Date  . Anemia   . Arthritis    "hips" (01/15/2015)  . Blood transfusion without reported diagnosis   . Chronic lower back pain   . Colon polyps   . Depression    denies  . Diabetes mellitus    diet controlled- no meds since wt loss  . Dysrhythmia    s/p surgery bariatric- cardioversion  . GERD (gastroesophageal reflux disease)   . Hip pain   . History of gout   . Hyperlipidemia   . Hypertension   . Joint pain   . Neuromuscular disorder ([place])   . PONV (postoperative nausea and vomiting)   . Pre-diabetes   . Sleep apnea    no longer have to use cpap since wt loss  . Stage 3 chronic kidney disease 01/26/2018  . Stroke [facility]) 2006   denies residual on 01/15/2015    Past Surgical History:  Procedure Laterality Date  . BARIATRIC SURGERY  2010   in [place]  . CARDIOVERSION    . COLONOSCOPY N/A 08/02/2014   Procedure: COLONOSCOPY;  Surgeon: Rogene Houston, MD;  [facility];  Service: Endoscopy;  Laterality: N/A;  210 -  moved to 3/10 @ 11:55 - Ann notified pt  . ESOPHAGOGASTRODUODENOSCOPY    . HERNIA REPAIR    . LIPOSUCTION    . PANNICULECTOMY  01/14/2015  . PANNICULECTOMY N/A 01/14/2015   Procedure:  TOTAL PANNICULECTOMY WTH LIPOSUCTION OF SIDES;  Surgeon: Cristine Polio, MD;  Location: [place];  Service: Plastics;  Laterality: N/A;  . TONSILLECTOMY    . VENTRAL HERNIA REPAIR  01/14/2015  . VENTRAL HERNIA REPAIR N/A 01/14/2015   Procedure: HERNIA REPAIR VENTRAL ADULT AND MUSCLE REPAIR;  Surgeon: Cristine Polio, MD;  Location: [place];  Service: Plastics;  Laterality: N/A;    There were no vitals filed for this visit.  Subjective Assessment - 08/08/19 1438    Subjective  Patient reports no new complaints. Says he was tired after Thursday's session but liked aquatic therapy. Patient says pain is about a 4 today.    Pertinent History  obesity, depression, advanced hip OA, history of morbid obesity (was 600 pounds), history of low back pain    Patient Stated Goals  to have less pain, to return to work    Currently in Pain?  Yes    Pain Score  4     Pain Location  Hip    Pain Orientation  Right;Left    Pain Descriptors / Indicators  Aching    Pain Type  Chronic pain    Pain Onset  More than a month ago    Pain Frequency  Constant                       [facility] Adult PT Treatment/Exercise - 08/08/19 0001      Knee/Hip Exercises: Stretches   Knee: Self-Stretch to increase Flexion  Both;5 reps;10 seconds    Knee: Self-Stretch Limitations  at second step      Knee/Hip Exercises: Aerobic   Nustep  5 min warmup for mobility Lv 5       Knee/Hip Exercises: Standing   Heel Raises  Both;20 reps    Forward Step Up  Both;1 set;10 reps;Hand Hold: 2;Step Height: 4"    Other Standing Knee Exercises  sidestepping at // bars 4 RT     Other Standing Knee Exercises  Standing marches x20 at // bars       Knee/Hip Exercises: Seated   Sit to Sand  10 reps;with UE support   elevated seat on foam       Manual Therapy   Manual Therapy  Manual Traction    Manual therapy comments  completed seperate from rest of treatment interventions    Manual Traction  manual LAD to bilateral LEs, 3 x 60" grade II oscillations, 3 x 30" static holds/ grade II               PT Short Term Goals - 07/18/19 1544      PT SHORT TERM GOAL #1   Title  Patient will be able to transition from sit to stand and stand to sit in under 6 seconds to demonstrate improved transitional mobility.    Baseline  2/10 8.5 seconds with B UE use and walking stick for assist; 07/18/19: 5.6 sec    Time  3    Period  Weeks    Status  Achieved    Target Date  07/27/19      PT SHORT TERM GOAL #2   Title  Patient will be independent in HEP to improve functional outcomes.    Baseline  07/18/19: patient reports complaince with HEP with no issues, says exercises have been helping    Time  3    Period  Weeks    Status  Achieved    Target Date  07/27/19      PT SHORT TERM GOAL #3   Title  Patient will be able to ambulate at least 226 feet in 2 minutes with required assistive device to demonstrate improved walking endurance.    Time  3    Period  Weeks    Status  Achieved    Target Date  07/27/19        PT Long Term Goals - 07/18/19 1546      PT LONG TERM GOAL #1   Title  Patient will report at least 50% improvement in overall symptoms to improve QOL.    Baseline  07/18/19: 30% reported    Time  6    Period  Weeks    Status  On-going      PT LONG TERM GOAL #2   Title  Patient will be able to ambulate at least 275 feet in 2 minutes with required assistive device to demonstrate improved walking endurance.  Time  6    Period  Weeks    Status  On-going      PT LONG TERM GOAL #3   Title  Patient will be able to perform 5x sit to stand test in under 50 seconds to demonstrate improved functional endurance.    Baseline  07/18/19: 19.5 seconds, with heavy use of arms for initial lift from chair    Time  6    Period   Weeks    Status  On-going            Plan - 08/08/19 1516    Clinical Impression Statement  Patient tolerated session well overall. Continues to have difficulty with hip mobility, especially with forward hip flexion in functional position as in ascending stairs. Attempted to ambulate 6 inch step today, patient unable due to ROM limitation. Added sidestepping at // bars, patient cued on upright posturing. Also added sit to stands, but required elevated surface and use of UEs, as patient was unable from standard chair due to pain. Patient noted mild soreness in lateral hips during activity, but reports only muscle soreness, no pain, post session.    Personal Factors and Comorbidities  Comorbidity 1;Comorbidity 2;Finances    Comorbidities  depression, obesity, severe B hip OA, hx lumbar pain    Examination-Activity Limitations  Bed Mobility;Bathing;Sit;Squat;Bend;Stairs;Stand;Carry;Transfers;Hygiene/Grooming;Lift;Locomotion Level;Reach Overhead    Examination-Participation Restrictions  Meal Prep;Laundry;Yard Work;Community Activity;Cleaning    Stability/Clinical Decision Making  Evolving/Moderate complexity    Rehab Potential  Fair    PT Frequency  2x / week    PT Duration  6 weeks    PT Treatment/Interventions  ADLs/Self Care Home Management;Aquatic Therapy;Biofeedback;Cryotherapy;Electrical Stimulation;Moist Heat;Traction;Iontophoresis 4mg /ml Dexamethasone;Balance training;Therapeutic exercise;Manual techniques;Therapeutic activities;Functional mobility training;Stair training;Gait training;Neuromuscular re-education;Patient/family education;Dry needling;Passive range of motion;Joint Manipulations    PT Next Visit Plan  Continue to progress hip mobility and strengtheing as tolerated. Add SLS next visit.    PT Home Exercise Plan  discussed return to pool at [facility]; 2/16: ab set with exhale, bridge    Consulted and Agree with Plan of Care  Patient       Patient will benefit from skilled  therapeutic intervention in order to improve the following deficits and impairments:  Abnormal gait, Decreased endurance, Hypomobility, Obesity, Decreased activity tolerance, Decreased mobility, Difficulty walking, Decreased balance, Decreased strength, Pain, Decreased range of motion, Improper body mechanics  Visit Diagnosis: Pain in left hip  Pain in right hip  Difficulty in walking, not elsewhere classified     Problem List Patient Active Problem List   Diagnosis Date Noted  . Primary osteoarthritis of both hips 07/10/2019  . Diabetes mellitus ([place]) 07/10/2019  . Vitamin D deficiency 04/13/2019  . Depression 04/13/2019  . Class 3 severe obesity with serious comorbidity and body mass index (BMI) of 40.0 to 44.9 in adult [facility]) 04/13/2019  . 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 ([place]) 08/01/2018  . Cellulitis 07/25/2018  . Erectile dysfunction 01/26/2018  . Stage 3 chronic kidney disease 07/20/2017  . Persistent proteinuria 07/20/2017  . History of cerebrovascular accident 07/20/2017  . Gastroesophageal reflux disease 07/20/2017  . Prediabetes 03/29/2017  . Chronic back pain greater than 3 months duration 02/11/2017  . Narcotic dependence ([place]) 02/11/2017  . Benzodiazepine dependence ([place]) 02/11/2017  . Gastric bypass status for obesity 02/11/2017  . COLONIC POLYPS, ADENOMATOUS 08/03/2008  . Type 2 diabetes mellitus with diabetic nephropathy ([place]) 08/03/2008  . Hyperlipidemia 08/03/2008  . CVA 08/03/2008  . HIATAL HERNIA 08/03/2008  .  SLEEP APNEA 08/03/2008  . ALLERGY 08/03/2008    3:20 PM, 08/08/19 Josue Hector PT DPT  Physical Therapist with [facility]  [phone] [place] [address] [place], [place], [address] Phone: [phone]   Fax:  [phone]  Name: [name] MRN: [id number] Date of Birth: 1971/03/17

## 2019-08-10 ENCOUNTER — Ambulatory Visit (HOSPITAL_COMMUNITY): Payer: Medicaid Other | Admitting: Physical Therapy

## 2019-08-14 ENCOUNTER — Other Ambulatory Visit (INDEPENDENT_AMBULATORY_CARE_PROVIDER_SITE_OTHER): Payer: Self-pay | Admitting: Family Medicine

## 2019-08-14 DIAGNOSIS — M160 Bilateral primary osteoarthritis of hip: Secondary | ICD-10-CM

## 2019-08-15 ENCOUNTER — Ambulatory Visit (HOSPITAL_COMMUNITY): Payer: Medicaid Other | Admitting: Physical Therapy

## 2019-08-15 ENCOUNTER — Other Ambulatory Visit: Payer: Self-pay

## 2019-08-15 ENCOUNTER — Encounter (HOSPITAL_COMMUNITY): Payer: Self-pay | Admitting: Physical Therapy

## 2019-08-15 DIAGNOSIS — M25552 Pain in left hip: Secondary | ICD-10-CM | POA: Diagnosis not present

## 2019-08-15 DIAGNOSIS — M25551 Pain in right hip: Secondary | ICD-10-CM | POA: Diagnosis not present

## 2019-08-15 DIAGNOSIS — R262 Difficulty in walking, not elsewhere classified: Secondary | ICD-10-CM | POA: Diagnosis not present

## 2019-08-15 NOTE — Therapy (Signed)
[place], [place], [address] Phone: [phone]   Fax:  [phone]  Physical Therapy Treatment  Patient Details  Name: [name] MRN: [id number] Date of Birth: 10-Dec-1970 Referring Provider (PT): [name]   Encounter Date: 08/15/2019  PT End of Session - 08/15/19 1405    Visit Number  9    Number of Visits  12    Date for PT Re-Evaluation  08/16/19    Authorization Type  Medicaid approved authorization 8 visits from 2/26-->08/17/19    Authorization Time Period  POC dates 07/05/19 to 08/16/2019    Authorization - Visit Number  6    Authorization - Number of Visits  8    Progress Note Due on Visit  12    PT Start Time  1400    PT Stop Time  1440    PT Time Calculation (min)  40 min    Activity Tolerance  Patient tolerated treatment well;Patient limited by pain    Behavior During Therapy  [facility] for tasks assessed/performed       Past Medical History:  Diagnosis Date  . Anemia   . Arthritis    "hips" (01/15/2015)  . Blood transfusion without reported diagnosis   . Chronic lower back pain   . Colon polyps   . Depression    denies  . Diabetes mellitus    diet controlled- no meds since wt loss  . Dysrhythmia    s/p surgery bariatric- cardioversion  . GERD (gastroesophageal reflux disease)   . Hip pain   . History of gout   . Hyperlipidemia   . Hypertension   . Joint pain   . Neuromuscular disorder ([place])   . PONV (postoperative nausea and vomiting)   . Pre-diabetes   . Sleep apnea    no longer have to use cpap since wt loss  . Stage 3 chronic kidney disease 01/26/2018  . Stroke [facility]) 2006   denies residual on 01/15/2015    Past Surgical History:  Procedure Laterality Date  . BARIATRIC SURGERY  2010   in [place]  . CARDIOVERSION    . COLONOSCOPY N/A 08/02/2014   Procedure: COLONOSCOPY;  Surgeon: Rogene Houston, MD;  [facility];  Service: Endoscopy;  Laterality: N/A;  210 -  moved to 3/10 @ 11:55 - Ann notified pt  . ESOPHAGOGASTRODUODENOSCOPY    . HERNIA REPAIR    . LIPOSUCTION    . PANNICULECTOMY  01/14/2015  . PANNICULECTOMY N/A 01/14/2015   Procedure:  TOTAL PANNICULECTOMY WTH LIPOSUCTION OF SIDES;  Surgeon: Cristine Polio, MD;  Location: [place];  Service: Plastics;  Laterality: N/A;  . TONSILLECTOMY    . VENTRAL HERNIA REPAIR  01/14/2015  . VENTRAL HERNIA REPAIR N/A 01/14/2015   Procedure: HERNIA REPAIR VENTRAL ADULT AND MUSCLE REPAIR;  Surgeon: Cristine Polio, MD;  Location: [place];  Service: Plastics;  Laterality: N/A;    There were no vitals filed for this visit.  Subjective Assessment - 08/15/19 1404    Subjective  States that he is still having lots of stiffness in both hips and that he is having pain in his left hip. Patient reports he feels about 60% better since the start of PT. States the stretching has helped out the most. Patient reports he is very frustrated. He is trying to lose weight but he is veyr depressed and in a lot of pain and sometimes he can't get himself to do much of  anything.    Pertinent History  obesity, depression, advanced hip OA, history of morbid obesity (was 600 pounds), history of low back pain    Patient Stated Goals  to have less pain, to return to work    Currently in Pain?  Yes    Pain Score  4     Pain Location  Hip    Pain Orientation  Left;Right    Pain Descriptors / Indicators  Aching    Pain Onset  More than a month ago         [facility] PT Assessment - 08/15/19 0001      Assessment   Medical Diagnosis  bilateral hip OA    Referring Provider (PT)  [name]    Prior Therapy  no      Precautions   Precautions  [facility] Adult PT Treatment/Exercise - 08/15/19 0001      Ambulation/Gait   Ambulation/Gait  Yes    Ambulation/Gait Assistance  6: Modified independent (Device/Increase time)    Ambulation Distance (Feet)  226 Feet    Assistive device  Straight cane    Gait  Pattern  Wide base of support;Decreased hip/knee flexion - right;Decreased hip/knee flexion - left;Decreased dorsiflexion - right;Decreased dorsiflexion - left;Right circumduction;Left circumduction    Ambulation Surface  Level;Indoor    Gait Comments  2MWT      Knee/Hip Exercises: Prone   Hamstring Curl  10 reps;5 seconds   3 sets B   Hip Extension  AROM;Strengthening;Both;3 sets;5 reps    Other Prone Exercises  contract relax for hip IR/ER and knee flexion focus on Left but performed bilaterally.     Other Prone Exercises  lying on stomach 4 minutes, prone on elbows x20               PT Short Term Goals - 07/18/19 1544      PT SHORT TERM GOAL #1   Title  Patient will be able to transition from sit to stand and stand to sit in under 6 seconds to demonstrate improved transitional mobility.    Baseline  2/10 8.5 seconds with B UE use and walking stick for assist; 07/18/19: 5.6 sec    Time  3    Period  Weeks    Status  Achieved    Target Date  07/27/19      PT SHORT TERM GOAL #2   Title  Patient will be independent in HEP to improve functional outcomes.    Baseline  07/18/19: patient reports complaince with HEP with no issues, says exercises have been helping    Time  3    Period  Weeks    Status  Achieved    Target Date  07/27/19      PT SHORT TERM GOAL #3   Title  Patient will be able to ambulate at least 226 feet in 2 minutes with required assistive device to demonstrate improved walking endurance.    Time  3    Period  Weeks    Status  Achieved    Target Date  07/27/19        PT Long Term Goals - 08/15/19 1437      PT LONG TERM GOAL #1   Title  Patient will report at least 50% improvement in overall symptoms to improve QOL.    Baseline  08/15/19 60% better  Time  6    Period  Weeks    Status  On-going      PT LONG TERM GOAL #2   Title  Patient will be able to ambulate at least 275 feet in 2 minutes with required assistive device to demonstrate improved  walking endurance.    Time  6    Period  Weeks    Status  On-going      PT LONG TERM GOAL #3   Title  Patient will be able to perform 5x sit to stand test in under 50 seconds to demonstrate improved functional endurance.    Baseline  07/18/19: 19.5 seconds, with heavy use of arms for initial lift from chair    Time  6    Period  Weeks    Status  On-going            Plan - 08/15/19 1604    Clinical Impression Statement  Patient very limited in hip and functional mobility on this date. Focused on hip ROM and patient tolerated this moderately well demonstrating improved hip ROM by end of session and decreased reports of hip stiffness. Patient reported lots of feelings of frustration and depression during today's session secondary to continued pain and limitations with overall function. Reassured patient and educated patient in progress made as well as rationale behind new exercises to improve hip mobility. Reassessment on next session. Patient would continue to benefit from skilled physical therapy to improve functional mobility.    Personal Factors and Comorbidities  Comorbidity 1;Comorbidity 2;Finances    Comorbidities  depression, obesity, severe B hip OA, hx lumbar pain    Examination-Activity Limitations  Bed Mobility;Bathing;Sit;Squat;Bend;Stairs;Stand;Carry;Transfers;Hygiene/Grooming;Lift;Locomotion Level;Reach Overhead    Examination-Participation Restrictions  Meal Prep;Laundry;Yard Work;Community Activity;Cleaning    Stability/Clinical Decision Making  Evolving/Moderate complexity    Rehab Potential  Fair    PT Frequency  2x / week    PT Duration  6 weeks    PT Treatment/Interventions  ADLs/Self Care Home Management;Aquatic Therapy;Biofeedback;Cryotherapy;Electrical Stimulation;Moist Heat;Traction;Iontophoresis 4mg /ml Dexamethasone;Balance training;Therapeutic exercise;Manual techniques;Therapeutic activities;Functional mobility training;Stair training;Gait training;Neuromuscular  re-education;Patient/family education;Dry needling;Passive range of motion;Joint Manipulations    PT Next Visit Plan  PN next session. continue to work on hip mobility, prone movements and glute activation    PT Home Exercise Plan  discussed return to pool at [facility]; 2/16: ab set with exhale, bridge    Consulted and Agree with Plan of Care  Patient       Patient will benefit from skilled therapeutic intervention in order to improve the following deficits and impairments:  Abnormal gait, Decreased endurance, Hypomobility, Obesity, Decreased activity tolerance, Decreased mobility, Difficulty walking, Decreased balance, Decreased strength, Pain, Decreased range of motion, Improper body mechanics  Visit Diagnosis: Pain in left hip  Pain in right hip  Difficulty in walking, not elsewhere classified     Problem List Patient Active Problem List   Diagnosis Date Noted  . Primary osteoarthritis of both hips 07/10/2019  . Diabetes mellitus ([place]) 07/10/2019  . Vitamin D deficiency 04/13/2019  . Depression 04/13/2019  . Class 3 severe obesity with serious comorbidity and body mass index (BMI) of 40.0 to 44.9 in adult [facility]) 04/13/2019  . 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 ([place]) 08/01/2018  . Cellulitis 07/25/2018  . Erectile dysfunction 01/26/2018  . Stage 3 chronic kidney disease 07/20/2017  . Persistent proteinuria 07/20/2017  . History of cerebrovascular accident 07/20/2017  . Gastroesophageal reflux disease 07/20/2017  . Prediabetes 03/29/2017  . Chronic back  pain greater than 3 months duration 02/11/2017  . Narcotic dependence ([place]) 02/11/2017  . Benzodiazepine dependence ([place]) 02/11/2017  . Gastric bypass status for obesity 02/11/2017  . COLONIC POLYPS, ADENOMATOUS 08/03/2008  . Type 2 diabetes mellitus with diabetic nephropathy ([place]) 08/03/2008  . Hyperlipidemia 08/03/2008  . CVA 08/03/2008  . HIATAL HERNIA 08/03/2008  . SLEEP APNEA 08/03/2008  . ALLERGY 08/03/2008     4:19 PM, 08/15/19 Jerene Pitch, DPT Physical Therapy with [facility]  [phone] office   [place] [address] [place], [place], [address] Phone: [phone]   Fax:  [phone]  Name: [name] MRN: [id number] Date of Birth: 06/12/1970

## 2019-08-17 ENCOUNTER — Encounter (HOSPITAL_COMMUNITY): Payer: Self-pay | Admitting: Physical Therapy

## 2019-08-17 ENCOUNTER — Other Ambulatory Visit: Payer: Self-pay

## 2019-08-17 ENCOUNTER — Ambulatory Visit (HOSPITAL_COMMUNITY): Payer: Medicaid Other | Admitting: Physical Therapy

## 2019-08-17 ENCOUNTER — Encounter: Payer: Self-pay | Admitting: Family Medicine

## 2019-08-17 DIAGNOSIS — R262 Difficulty in walking, not elsewhere classified: Secondary | ICD-10-CM

## 2019-08-17 DIAGNOSIS — M25552 Pain in left hip: Secondary | ICD-10-CM

## 2019-08-17 DIAGNOSIS — M25551 Pain in right hip: Secondary | ICD-10-CM

## 2019-08-17 NOTE — Therapy (Signed)
[place] [address] [place], [place], [address] Phone: [phone]   Fax:  [phone]  Physical Therapy Treatment and Discharge Note  Patient Details  Name: [name] MRN: [id number] Date of Birth: 07/13/1970 Referring Provider (PT): [name]  PHYSICAL THERAPY DISCHARGE SUMMARY  Visits from Start of Care: 10  Current functional level related to goals / functional outcomes: 3/3 STG and 2/3 LTG met   Remaining deficits: Continued hip mobility and strength deficits    Education / Equipment: See below Plan: Patient agrees to discharge.  Patient goals were partially met. Patient is being discharged due to meeting the stated rehab goals.  ?????       Encounter Date: 08/17/2019  PT End of Session - 08/17/19 1356    Visit Number  10    Number of Visits  12    Date for PT Re-Evaluation  08/16/19    Authorization Type  Medicaid approved authorization 8 visits from 2/26-->08/17/19    Authorization Time Period  POC dates 07/05/19 to 08/16/2019    Authorization - Visit Number  7    Authorization - Number of Visits  8    Progress Note Due on Visit  12    PT Start Time  1400    PT Stop Time  1440    PT Time Calculation (min)  40 min    Activity Tolerance  Patient tolerated treatment well;Patient limited by pain    Behavior During Therapy  [facility] for tasks assessed/performed       Past Medical History:  Diagnosis Date  . Anemia   . Arthritis    "hips" (01/15/2015)  . Blood transfusion without reported diagnosis   . Chronic lower back pain   . Colon polyps   . Depression    denies  . Diabetes mellitus    diet controlled- no meds since wt loss  . Dysrhythmia    s/p surgery bariatric- cardioversion  . GERD (gastroesophageal reflux disease)   . Hip pain   . History of gout   . Hyperlipidemia   . Hypertension   . Joint pain   . Neuromuscular disorder ([place])   . PONV (postoperative nausea and vomiting)   .  Pre-diabetes   . Sleep apnea    no longer have to use cpap since wt loss  . Stage 3 chronic kidney disease 01/26/2018  . Stroke [facility]) 2006   denies residual on 01/15/2015    Past Surgical History:  Procedure Laterality Date  . BARIATRIC SURGERY  2010   in [place]  . CARDIOVERSION    . COLONOSCOPY N/A 08/02/2014   Procedure: COLONOSCOPY;  Surgeon: Rogene Houston, MD;  [facility];  Service: Endoscopy;  Laterality: N/A;  210 - moved to 3/10 @ 11:55 - Ann notified pt  . ESOPHAGOGASTRODUODENOSCOPY    . HERNIA REPAIR    . LIPOSUCTION    . PANNICULECTOMY  01/14/2015  . PANNICULECTOMY N/A 01/14/2015   Procedure:  TOTAL PANNICULECTOMY WTH LIPOSUCTION OF SIDES;  Surgeon: Cristine Polio, MD;  Location: [place];  Service: Plastics;  Laterality: N/A;  . TONSILLECTOMY    . VENTRAL HERNIA REPAIR  01/14/2015  . VENTRAL HERNIA REPAIR N/A 01/14/2015   Procedure: HERNIA REPAIR VENTRAL ADULT AND MUSCLE REPAIR;  Surgeon: Cristine Polio, MD;  Location: [place];  Service: Plastics;  Laterality: N/A;    There were no vitals filed for this visit.  Subjective Assessment - 08/17/19 1404  Subjective  States he is 60% better since start therapy. States he is getting better at motivating himself. States he was feeling good after last session but is stiff in hips today after sitting for a bit.    Pertinent History  obesity, depression, advanced hip OA, history of morbid obesity (was 600 pounds), history of low back pain    Patient Stated Goals  to have less pain, to return to work    Currently in Pain?  Yes    Pain Score  4     Pain Location  Hip    Pain Orientation  Left;Right    Pain Descriptors / Indicators  Dull    Pain Onset  More than a month ago         [facility] PT Assessment - 08/17/19 0001      Assessment   Medical Diagnosis  bilateral hip OA    Referring Provider (PT)  [name]      AROM   Right Hip Flexion  85   pain    Left Hip Flexion  85   pain     Strength   Right  Hip Flexion  3-/5   pain    Right Hip Extension  3-/5    Right Hip External Rotation   --   pain    Left Hip Flexion  3-/5   pain    Left Hip Extension  3-/5   pain    Left Hip ABduction  3-/5   pain    Right Knee Flexion  5/5    Right Knee Extension  5/5    Left Knee Flexion  5/5    Left Knee Extension  5/5    Right Ankle Dorsiflexion  5/5    Left Ankle Dorsiflexion  5/5      Transfers   Five time sit to stand comments   25.16 seconds, with heavy use of arms for initial lift from chair                   [facility] Adult PT Treatment/Exercise - 08/17/19 0001      Ambulation/Gait   Ambulation/Gait  Yes    Ambulation/Gait Assistance  6: Modified independent (Device/Increase time)    Ambulation Distance (Feet)  269 Feet    Assistive device  Straight cane    Gait Pattern  Wide base of support;Decreased hip/knee flexion - right;Decreased hip/knee flexion - left;Decreased dorsiflexion - right;Decreased dorsiflexion - left;Right circumduction;Left circumduction    Ambulation Surface  Level;Indoor    Gait Comments  2MW      Knee/Hip Exercises: Stretches   Piriformis Stretch  5 reps;10 seconds;Both   seated with towel for assist, 2 sets   Soleus Stretch Limitations  SKC with strap x5, 10" holds B       Knee/Hip Exercises: Supine   Other Supine Knee/Hip Exercises  hip add stretch 3x5 10" holds B      Manual Therapy   Manual Therapy  Passive ROM    Manual therapy comments  completed seperate from rest of treatment interventions    Passive ROM  PT holds into flexion adn ER in seated position - TE - 60" holds x1 B              PT Education - 08/17/19 1425    Education Details  answered all questions about current presentation, progress made and plan moving forward. reviewed HEP.    Person(s) Educated  Patient    Methods  Explanation    Comprehension  Verbalized understanding       PT Short Term Goals - 07/18/19 1544      PT SHORT TERM GOAL #1   Title   Patient will be able to transition from sit to stand and stand to sit in under 6 seconds to demonstrate improved transitional mobility.    Baseline  2/10 8.5 seconds with B UE use and walking stick for assist; 07/18/19: 5.6 sec    Time  3    Period  Weeks    Status  Achieved    Target Date  07/27/19      PT SHORT TERM GOAL #2   Title  Patient will be independent in HEP to improve functional outcomes.    Baseline  07/18/19: patient reports complaince with HEP with no issues, says exercises have been helping    Time  3    Period  Weeks    Status  Achieved    Target Date  07/27/19      PT SHORT TERM GOAL #3   Title  Patient will be able to ambulate at least 226 feet in 2 minutes with required assistive device to demonstrate improved walking endurance.    Time  3    Period  Weeks    Status  Achieved    Target Date  07/27/19        PT Long Term Goals - 08/17/19 1405      PT LONG TERM GOAL #1   Title  Patient will report at least 50% improvement in overall symptoms to improve QOL.    Baseline  08/17/19 60% better    Time  6    Period  Weeks    Status  Achieved      PT LONG TERM GOAL #2   Title  Patient will be able to ambulate at least 275 feet in 2 minutes with required assistive device to demonstrate improved walking endurance.    Baseline  3/25 269 feet in 2MW    Time  6    Period  Weeks    Status  On-going      PT LONG TERM GOAL #3   Title  Patient will be able to perform 5x sit to stand test in under 50 seconds to demonstrate improved functional endurance.    Baseline  08/17/19: 25.16 seconds, with heavy use of arms for initial lift from chair    Time  6    Period  Weeks    Status  Achieved            Plan - 08/17/19 1356    Clinical Impression Statement  Patient present for a progress note on this date. He has made great progress and has met 3/3 short term goals and 2/3 long term goals at this time. He continues to have limitations in hip ROM and strength but he is  improving in functional mobility. Reviewed home program and answered all questions prior to end of session. Patient to discharge from PT to HEP and follow up with MD at this time secondary to progress made and continued ROM restrictions.    Personal Factors and Comorbidities  Comorbidity 1;Comorbidity 2;Finances    Comorbidities  depression, obesity, severe B hip OA, hx lumbar pain    Examination-Activity Limitations  Bed Mobility;Bathing;Sit;Squat;Bend;Stairs;Stand;Carry;Transfers;Hygiene/Grooming;Lift;Locomotion Level;Reach Overhead    Examination-Participation Restrictions  Meal Prep;Laundry;Yard Work;Community Activity;Cleaning    Stability/Clinical Decision Making  Evolving/Moderate complexity    Rehab Potential  Fair  PT Frequency  2x / week    PT Duration  6 weeks    PT Treatment/Interventions  ADLs/Self Care Home Management;Aquatic Therapy;Biofeedback;Cryotherapy;Electrical Stimulation;Moist Heat;Traction;Iontophoresis 49m/ml Dexamethasone;Balance training;Therapeutic exercise;Manual techniques;Therapeutic activities;Functional mobility training;Stair training;Gait training;Neuromuscular re-education;Patient/family education;Dry needling;Passive range of motion;Joint Manipulations    PT Next Visit Plan  pt to [place] from PT to [facility] [facility] ab set with exhale, bridge, prone exercises    Consulted and Agree with Plan of Care  Patient       Patient will benefit from skilled therapeutic intervention in order to improve the following deficits and impairments:  Abnormal gait, Decreased endurance, Hypomobility, Obesity, Decreased activity tolerance, Decreased mobility, Difficulty walking, Decreased balance, Decreased strength, Pain, Decreased range of motion, Improper body mechanics  Visit Diagnosis: Pain in left hip  Pain in right hip  Difficulty in walking, not elsewhere classified     Problem List Patient Active Problem List    Diagnosis Date Noted  . Primary osteoarthritis of both hips 07/10/2019  . Diabetes mellitus ([place] 07/10/2019  . Vitamin D deficiency 04/13/2019  . Depression 04/13/2019  . Class 3 severe obesity with serious comorbidity and body mass index (BMI) of 40.0 to 44.9 in adult ([facility] 04/13/2019  . 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 ([place] 08/01/2018  . Cellulitis 07/25/2018  . Erectile dysfunction 01/26/2018  . Stage 3 chronic kidney disease 07/20/2017  . Persistent proteinuria 07/20/2017  . History of cerebrovascular accident 07/20/2017  . Gastroesophageal reflux disease 07/20/2017  . Prediabetes 03/29/2017  . Chronic back pain greater than 3 months duration 02/11/2017  . Narcotic dependence ([place] 02/11/2017  . Benzodiazepine dependence ([place] 02/11/2017  . Gastric bypass status for obesity 02/11/2017  . COLONIC POLYPS, ADENOMATOUS 08/03/2008  . Type 2 diabetes mellitus with diabetic nephropathy ([place] 08/03/2008  . Hyperlipidemia 08/03/2008  . CVA 08/03/2008  . HIATAL HERNIA 08/03/2008  . SLEEP APNEA 08/03/2008  . ALLERGY 08/03/2008    2:40 PM, 08/17/19 MJerene Pitch DPT [facility] [phone]   [place] [address]: [phone]  Fax:  [phone] Name: [name] CORPUSMRN: [id number] of Birth: 5May 23, 1972

## 2019-08-30 DIAGNOSIS — M25551 Pain in right hip: Secondary | ICD-10-CM | POA: Diagnosis not present

## 2019-08-31 ENCOUNTER — Encounter: Payer: Self-pay | Admitting: Family Medicine

## 2019-09-01 ENCOUNTER — Ambulatory Visit
Admission: EM | Admit: 2019-09-01 | Discharge: 2019-09-01 | Disposition: A | Discharge status: Home / Self Care | Payer: Medicaid Other | Attending: Emergency Medicine | Admitting: Emergency Medicine

## 2019-09-01 ENCOUNTER — Encounter: Payer: Self-pay | Admitting: Family Medicine

## 2019-09-01 ENCOUNTER — Telehealth: Payer: Self-pay

## 2019-09-01 ENCOUNTER — Other Ambulatory Visit: Payer: Self-pay

## 2019-09-01 DIAGNOSIS — L02214 Cutaneous abscess of groin: Secondary | ICD-10-CM | POA: Diagnosis not present

## 2019-09-01 MED ORDER — CEFTRIAXONE SODIUM 1 G IJ SOLR
1.0000 g | Freq: Once | INTRAMUSCULAR | Status: AC
  Administered 2019-09-01: 1 g via INTRAMUSCULAR

## 2019-09-01 MED ORDER — SULFAMETHOXAZOLE-TRIMETHOPRIM 800-160 MG PO TABS: 1.0000 | ORAL_TABLET | Freq: Two times a day (BID) | ORAL | 0 refills | Status: AC

## 2019-09-01 NOTE — ED Provider Notes (Signed)
[place]   [id number] 09/01/19 Arrival Time: 1117   [id number]  SUBJECTIVE:  [name] is a 48 y.o. male who presents with a possible abscess of his private area for the past few days.  Report area is red and draining pus.  Onset gradual, approximately a few days ago.  Denies similar symptoms in the past.  Has tried OTC Tylenol/ibuprofen without relief.  Denies chills, fever, nausea, vomiting, diarrhea, chest pain, chest tightness.  ROS: As per HPI.  All other pertinent ROS negative.     Past Medical History:  Diagnosis Date  . Anemia   . Arthritis    "hips" (01/15/2015)  . Blood transfusion without reported diagnosis   . Chronic lower back pain   . Colon polyps   . Depression    denies  . Diabetes mellitus    diet controlled- no meds since wt loss  . Dysrhythmia    s/p surgery bariatric- cardioversion  . GERD (gastroesophageal reflux disease)   . Hip pain   . History of gout   . Hyperlipidemia   . Hypertension   . Joint pain   . Neuromuscular disorder ([place])   . PONV (postoperative nausea and vomiting)   . Pre-diabetes   . Sleep apnea    no longer have to use cpap since wt loss  . Stage 3 chronic kidney disease 01/26/2018  . Stroke [facility]) 2006   denies residual on 01/15/2015   Past Surgical History:  Procedure Laterality Date  . BARIATRIC SURGERY  2010   in [place]  . CARDIOVERSION    . COLONOSCOPY N/A 08/02/2014   Procedure: COLONOSCOPY;  Surgeon: Rogene Houston, MD;  [facility];  Service: Endoscopy;  Laterality: N/A;  210 - moved to 3/10 @ 11:55 - Ann notified pt  . ESOPHAGOGASTRODUODENOSCOPY    . HERNIA REPAIR    . LIPOSUCTION    . PANNICULECTOMY  01/14/2015  . PANNICULECTOMY N/A 01/14/2015   Procedure:  TOTAL PANNICULECTOMY WTH LIPOSUCTION OF SIDES;  Surgeon: Cristine Polio, MD;  Location: [place];  Service: Plastics;  Laterality: N/A;  . TONSILLECTOMY    . VENTRAL HERNIA REPAIR  01/14/2015  . VENTRAL HERNIA REPAIR N/A 01/14/2015     Procedure: HERNIA REPAIR VENTRAL ADULT AND MUSCLE REPAIR;  Surgeon: Cristine Polio, MD;  Location: [place];  Service: Plastics;  Laterality: N/A;   No Known Allergies No current facility-administered medications on file prior to encounter.   Current Outpatient Medications on File Prior to Encounter  Medication Sig Dispense Refill  . acetaminophen (TYLENOL) 500 MG tablet Take 1,000 mg by mouth every 6 (six) hours as needed for moderate pain.    . blood glucose meter kit and supplies KIT Test daily Dx  r73.03 1 each 0  . cyclobenzaprine (FLEXERIL) 5 MG tablet Take 5 mg by mouth 3 (three) times daily as needed.    . latanoprost (XALATAN) 0.005 % ophthalmic solution Place 1 drop into both eyes at bedtime.    . liraglutide (VICTOZA) 18 MG/3ML SOPN Inject 0.1 mLs (0.6 mg total) into the skin daily. 1 pen 0  . Multiple Vitamins-Minerals (MULTIVITAMIN WITH MINERALS) tablet Take 1 tablet by mouth daily.    . naproxen (NAPROSYN) 500 MG tablet Take 1 tablet (500 mg total) by mouth daily. 30 tablet 0  . NARCAN 4 MG/0.1ML LIQD nasal spray kit ADMINISTER A SINGLE SPRAYDOF NARCAN INTO ONE NOSTRIL. REPEAT AFTER 2 TO 3 MINUTES IN OTHER NOSTRIL IF NO RESPONSE.    Marland Kitchen  Oxycodone HCl 20 MG TABS Take 1 tablet (20 mg total) by mouth 3 (three) times daily as needed. 30 tablet 0  . rosuvastatin (CRESTOR) 5 MG tablet Take 1 tablet (5 mg total) by mouth daily. 30 tablet 1  . sildenafil (VIAGRA) 25 MG tablet Take 1-2 tablets (25-50 mg total) by mouth daily as needed for erectile dysfunction. 20 tablet 0  . SURE COMFORT PEN NEEDLES 32G X 4 MM MISC USE TO INJECT VICTOZA ONCE DAILY 100 each 0  . timolol (BETIMOL) 0.5 % ophthalmic solution Apply 1 drop to eye daily.    . timolol (TIMOPTIC) 0.5 % ophthalmic solution 1 drop every morning.    Marland Kitchen UNABLE TO FIND 1 pair diabetic shoes 3 pairs of inserts Dx : r73.03 1 each 0  . Vitamin D, Ergocalciferol, (DRISDOL) 1.25 MG (50000 UNIT) CAPS capsule Take 1 capsule (50,000 Units  total) by mouth every 7 (seven) days. 4 capsule 0   Social History   Socioeconomic History  . Marital status: Single    Spouse name: Not on file  . Number of children: 0  . Years of education: 39  . Highest education level: Not on file  Occupational History  . Not on file  Tobacco Use  . Smoking status: Never Smoker  . Smokeless tobacco: Never Used  Substance and Sexual Activity  . Alcohol use: No  . Drug use: No  . Sexual activity: Yes    Birth control/protection: Condom  Other Topics Concern  . 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      Enjoy races, eat out, watch movies, listen music, reads some      Diet: Snacks a lot, tried to avoid red meat, does eat chicken and [place], eats a lot of carbs and breads (chips), and sweets   Caffeine: drinks a can monster daily   Water: 3-4 bottles 16.9 oz      Wear seat belt   Wear sunscreen   Smoke and carbon monoxide detectors    Reports not using phone when driving   Social Determinants of Health   Financial Resource Strain: [place]   . Difficulty of Paying Living Expenses: Not hard at all  Food Insecurity: No Food Insecurity  . Worried About Charity fundraiser in the Last Year: Never true  . Ran Out of Food in the Last Year: Never true  Transportation Needs: No Transportation Needs  . Lack of Transportation (Medical): No  . Lack of Transportation (Non-Medical): No  Physical Activity: Inactive  . Days of Exercise per Week: 0 days  . Minutes of Exercise per Session: 0 min  Stress: No Stress Concern Present  . Feeling of Stress : Only a little  Social Connections: Severely Isolated  . Frequency of Communication with Friends and Family: Never  . Frequency of Social Gatherings with Friends and Family: Never  . Attends Religious Services: Never  . Active Member of Clubs or Organizations: No  . Attends Archivist Meetings: Never  . Marital Status: Never married  Intimate Partner  Violence: Not At Risk  . Fear of Current or Ex-Partner: No  . Emotionally Abused: No  . Physically Abused: No  . Sexually Abused: No   Family History  Problem Relation Age of Onset  . COPD Mother   . Depression Mother   . Hyperlipidemia Mother   . Hypertension Mother   . Heart disease Mother   . Thyroid disease  Mother   . Anxiety disorder Mother   . Diabetes Father   . Pulmonary embolism Father 17       blood clot  . Hyperlipidemia Father   . Sudden death Father   . Obesity Father   . Eating disorder Father   . Hypertension Brother     OBJECTIVE:  Vitals:   09/01/19 1127  BP: 122/85  Pulse: 100  Resp: 20  Temp: 98.1 F (36.7 C)  SpO2: 95%     General appearance: alert; no distress Skin: Approximate 7-8 cm induration of skin in his private area ; tender to touch; erythematous,   active drainage Chest: CTA Heart: RRR Psychological: alert and cooperative; normal mood and affect  Procedure: I&D was not completed.  Patient was in agreement with conservative measures first.  ASSESSMENT & PLAN:    ICD-10-CM   1. Cutaneous abscess of groin  L02.214 sulfamethoxazole-trimethoprim (BACTRIM DS) 800-160 MG tablet    Meds ordered this encounter  Medications  . cefTRIAXone (ROCEPHIN) injection 1 g  . sulfamethoxazole-trimethoprim (BACTRIM DS) 800-160 MG tablet    Sig: Take 1 tablet by mouth 2 (two) times daily for 7 days.    Dispense:  14 tablet    Refill:  0    Abscess without incision and drainage: Apply warm compresses 3-4x daily for 10-15 minutes Wash site daily with warm water and mild soap Keep covered to avoid friction Take antibiotic as prescribed and to completion Follow up here or with PCP if symptoms persists Return or go to the ED if you have any new or worsening symptoms increased redness, swelling, pain, nausea, vomiting, fever, chills, etc...    Reviewed expectations re: course of current medical issues. Questions answered. Outlined signs and  symptoms indicating need for more acute intervention. Patient verbalized understanding. After Visit Summary given.          Emerson Monte, FNP 09/01/19 1146

## 2019-09-01 NOTE — ED Triage Notes (Signed)
Pt has large abscess in lower abdomen that developed about a week ago.

## 2019-09-01 NOTE — Discharge Instructions (Addendum)
Apply warm compresses 3-4x daily for 10-15 minutes Wash site daily with warm water and mild soap Keep covered to avoid friction Take antibiotic as prescribed and to completion Follow up here or with PCP if symptoms persists Return or go to the ED if you have any new or worsening symptoms increased redness, swelling, pain, nausea, vomiting, fever, chills, etc...   Reviewed expectations re: course of current medical issues. Questions answered. Outlined signs and symptoms indicating need for more acute intervention. Patient verbalized understanding. After Visit Summary given.

## 2019-09-01 NOTE — Telephone Encounter (Signed)
Pt sent a mychart message about a cyst in private area that has popped some but was draining and red and I offered him am appt but he was having car trouble so I advised he [place] [address] and he said he would if he could get a way there.

## 2019-09-01 NOTE — Telephone Encounter (Signed)
Do we have a weight loss center/clinic in [place]? Does he not like the one he is going to right now?

## 2019-09-06 NOTE — Telephone Encounter (Signed)
Please see if we can refer him to a different weight loss clinic local if able. Thank you

## 2019-09-11 ENCOUNTER — Encounter: Payer: Self-pay | Admitting: Family Medicine

## 2019-09-11 DIAGNOSIS — M545 Low back pain: Secondary | ICD-10-CM | POA: Diagnosis not present

## 2019-09-11 DIAGNOSIS — M25559 Pain in unspecified hip: Secondary | ICD-10-CM | POA: Diagnosis not present

## 2019-09-11 DIAGNOSIS — M79652 Pain in left thigh: Secondary | ICD-10-CM | POA: Diagnosis not present

## 2019-09-11 DIAGNOSIS — G894 Chronic pain syndrome: Secondary | ICD-10-CM | POA: Diagnosis not present

## 2019-09-11 DIAGNOSIS — Z79891 Long term (current) use of opiate analgesic: Secondary | ICD-10-CM | POA: Diagnosis not present

## 2019-09-22 DIAGNOSIS — M25552 Pain in left hip: Secondary | ICD-10-CM | POA: Diagnosis not present

## 2019-09-26 DIAGNOSIS — H40013 Open angle with borderline findings, low risk, bilateral: Secondary | ICD-10-CM | POA: Diagnosis not present

## 2019-09-26 DIAGNOSIS — H25813 Combined forms of age-related cataract, bilateral: Secondary | ICD-10-CM | POA: Diagnosis not present

## 2019-09-26 DIAGNOSIS — E1139 Type 2 diabetes mellitus with other diabetic ophthalmic complication: Secondary | ICD-10-CM | POA: Diagnosis not present

## 2019-10-04 ENCOUNTER — Encounter: Payer: Self-pay | Admitting: Family Medicine

## 2019-10-05 ENCOUNTER — Encounter (INDEPENDENT_AMBULATORY_CARE_PROVIDER_SITE_OTHER): Payer: Self-pay | Admitting: Family Medicine

## 2019-10-06 DIAGNOSIS — M25551 Pain in right hip: Secondary | ICD-10-CM | POA: Diagnosis not present

## 2019-10-06 DIAGNOSIS — M25552 Pain in left hip: Secondary | ICD-10-CM | POA: Diagnosis not present

## 2019-10-10 ENCOUNTER — Encounter: Payer: Self-pay | Admitting: Family Medicine

## 2019-10-11 ENCOUNTER — Encounter: Payer: Self-pay | Admitting: Family Medicine

## 2019-10-12 ENCOUNTER — Encounter: Payer: Self-pay | Admitting: Family Medicine

## 2019-10-18 ENCOUNTER — Encounter: Payer: Self-pay | Admitting: Family Medicine

## 2019-10-18 NOTE — Telephone Encounter (Signed)
Can you call and ask them what they needed? Have them refax paperwork, cause I do not have any. Thank you

## 2019-10-24 ENCOUNTER — Other Ambulatory Visit: Payer: Self-pay | Admitting: Family Medicine

## 2019-10-24 ENCOUNTER — Ambulatory Visit
Admission: EM | Admit: 2019-10-24 | Discharge: 2019-10-24 | Disposition: A | Discharge status: Home / Self Care | Payer: Medicaid Other | Attending: Emergency Medicine | Admitting: Emergency Medicine

## 2019-10-24 ENCOUNTER — Encounter: Payer: Self-pay | Admitting: Emergency Medicine

## 2019-10-24 ENCOUNTER — Encounter: Payer: Self-pay | Admitting: Family Medicine

## 2019-10-24 ENCOUNTER — Other Ambulatory Visit: Payer: Self-pay

## 2019-10-24 DIAGNOSIS — T148XXA Other injury of unspecified body region, initial encounter: Secondary | ICD-10-CM | POA: Diagnosis not present

## 2019-10-24 DIAGNOSIS — M160 Bilateral primary osteoarthritis of hip: Secondary | ICD-10-CM | POA: Diagnosis not present

## 2019-10-24 DIAGNOSIS — L089 Local infection of the skin and subcutaneous tissue, unspecified: Secondary | ICD-10-CM

## 2019-10-24 MED ORDER — CEPHALEXIN 500 MG PO CAPS: 500.0000 mg | ORAL_CAPSULE | Freq: Four times a day (QID) | ORAL | 0 refills | Status: DC

## 2019-10-24 MED ORDER — TETANUS-DIPHTH-ACELL PERTUSSIS 5-2.5-18.5 LF-MCG/0.5 IM SUSP
0.5000 mL | Freq: Once | INTRAMUSCULAR | Status: AC
  Administered 2019-10-24: 0.5 mL via INTRAMUSCULAR

## 2019-10-24 NOTE — Discharge Instructions (Signed)
Clean with warm water and mild soap.   Apply a thin layer of neosporin.  Take antibiotic as prescribed and to completion Return in 10 days to have sutures removed.   Take OTC ibuprofen or tylenol as needed for pain releif Return sooner or go to the ED if you have any new or worsening symptoms such as increased pain, redness, swelling, drainage, discharge, decreased range of motion of extremity, etc..

## 2019-10-24 NOTE — ED Triage Notes (Signed)
Pt cut area to top of RT hand in between 4th and little finger x4 days while working on a vehicle. Area is now red, swollen and painful.   Pt has been washing with dial soap and using neosporin.

## 2019-10-24 NOTE — Telephone Encounter (Signed)
We are happy to do this, please place referral. Thank you

## 2019-10-24 NOTE — ED Provider Notes (Addendum)
[place]   [id number] 10/24/19 Arrival Time: 1216  CC: Wound check  SUBJECTIVE:  [name] is a 49 y.o. male who presents to the urgent care for complaint of wound to the top of the right hand for the past 4- 5 days.  Stated he was working on a vehicle and his hand was cut.  Reported redness, swelling and increasing pain.  Bleeding controlled.  Currently not on blood thinners.  Denies similar symptoms in the past.  Denies fever, chills, nausea, vomiting, redness, swelling, purulent drainage, decrease strength or sensation.   Td UTD: Unknown.  ROS: As per HPI.  All other pertinent ROS negative.     Past Medical History:  Diagnosis Date  . Anemia   . Arthritis    "hips" (01/15/2015)  . Blood transfusion without reported diagnosis   . Chronic lower back pain   . Colon polyps   . Depression    denies  . Diabetes mellitus    diet controlled- no meds since wt loss  . Dysrhythmia    s/p surgery bariatric- cardioversion  . GERD (gastroesophageal reflux disease)   . Hip pain   . History of gout   . Hyperlipidemia   . Hypertension   . Joint pain   . Neuromuscular disorder ([place])   . PONV (postoperative nausea and vomiting)   . Pre-diabetes   . Sleep apnea    no longer have to use cpap since wt loss  . Stage 3 chronic kidney disease 01/26/2018  . Stroke [facility]) 2006   denies residual on 01/15/2015   Past Surgical History:  Procedure Laterality Date  . BARIATRIC SURGERY  2010   in [place]  . CARDIOVERSION    . COLONOSCOPY N/A 08/02/2014   Procedure: COLONOSCOPY;  Surgeon: Rogene Houston, MD;  [facility];  Service: Endoscopy;  Laterality: N/A;  210 - moved to 3/10 @ 11:55 - Ann notified pt  . ESOPHAGOGASTRODUODENOSCOPY    . HERNIA REPAIR    . LIPOSUCTION    . PANNICULECTOMY  01/14/2015  . PANNICULECTOMY N/A 01/14/2015   Procedure:  TOTAL PANNICULECTOMY WTH LIPOSUCTION OF SIDES;  Surgeon: Cristine Polio, MD;  Location: [place];  Service: Plastics;   Laterality: N/A;  . TONSILLECTOMY    . VENTRAL HERNIA REPAIR  01/14/2015  . VENTRAL HERNIA REPAIR N/A 01/14/2015   Procedure: HERNIA REPAIR VENTRAL ADULT AND MUSCLE REPAIR;  Surgeon: Cristine Polio, MD;  Location: [place];  Service: Plastics;  Laterality: N/A;   No Known Allergies No current facility-administered medications on file prior to encounter.   Current Outpatient Medications on File Prior to Encounter  Medication Sig Dispense Refill  . acetaminophen (TYLENOL) 500 MG tablet Take 1,000 mg by mouth every 6 (six) hours as needed for moderate pain.    . blood glucose meter kit and supplies KIT Test daily Dx  r73.03 1 each 0  . cyclobenzaprine (FLEXERIL) 5 MG tablet Take 5 mg by mouth 3 (three) times daily as needed.    . latanoprost (XALATAN) 0.005 % ophthalmic solution Place 1 drop into both eyes at bedtime.    . liraglutide (VICTOZA) 18 MG/3ML SOPN Inject 0.1 mLs (0.6 mg total) into the skin daily. 1 pen 0  . Multiple Vitamins-Minerals (MULTIVITAMIN WITH MINERALS) tablet Take 1 tablet by mouth daily.    . naproxen (NAPROSYN) 500 MG tablet Take 1 tablet (500 mg total) by mouth daily. 30 tablet 0  . NARCAN 4 MG/0.1ML LIQD nasal spray kit  ADMINISTER A SINGLE SPRAYDOF NARCAN INTO ONE NOSTRIL. REPEAT AFTER 2 TO 3 MINUTES IN OTHER NOSTRIL IF NO RESPONSE.    Marland Kitchen Oxycodone HCl 20 MG TABS Take 1 tablet (20 mg total) by mouth 3 (three) times daily as needed. 30 tablet 0  . rosuvastatin (CRESTOR) 5 MG tablet Take 1 tablet (5 mg total) by mouth daily. 30 tablet 1  . sildenafil (VIAGRA) 25 MG tablet Take 1-2 tablets (25-50 mg total) by mouth daily as needed for erectile dysfunction. 20 tablet 0  . SURE COMFORT PEN NEEDLES 32G X 4 MM MISC USE TO INJECT VICTOZA ONCE DAILY 100 each 0  . timolol (BETIMOL) 0.5 % ophthalmic solution Apply 1 drop to eye daily.    . timolol (TIMOPTIC) 0.5 % ophthalmic solution 1 drop every morning.    Marland Kitchen UNABLE TO FIND 1 pair diabetic shoes 3 pairs of inserts Dx : r73.03 1  each 0  . Vitamin D, Ergocalciferol, (DRISDOL) 1.25 MG (50000 UNIT) CAPS capsule Take 1 capsule (50,000 Units total) by mouth every 7 (seven) days. 4 capsule 0   Social History   Socioeconomic History  . Marital status: Single    Spouse name: Not on file  . Number of children: 0  . Years of education: 65  . Highest education level: Not on file  Occupational History  . Not on file  Tobacco Use  . Smoking status: Never Smoker  . Smokeless tobacco: Never Used  Substance and Sexual Activity  . Alcohol use: No  . Drug use: No  . Sexual activity: Yes    Birth control/protection: Condom  Other Topics Concern  . 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      Enjoy races, eat out, watch movies, listen music, reads some      Diet: Snacks a lot, tried to avoid red meat, does eat chicken and [place], eats a lot of carbs and breads (chips), and sweets   Caffeine: drinks a can monster daily   Water: 3-4 bottles 16.9 oz      Wear seat belt   Wear sunscreen   Smoke and carbon monoxide detectors    Reports not using phone when driving   Social Determinants of Health   Financial Resource Strain: [place]   . Difficulty of Paying Living Expenses: Not hard at all  Food Insecurity: No Food Insecurity  . Worried About Charity fundraiser in the Last Year: Never true  . Ran Out of Food in the Last Year: Never true  Transportation Needs: No Transportation Needs  . Lack of Transportation (Medical): No  . Lack of Transportation (Non-Medical): No  Physical Activity: Inactive  . Days of Exercise per Week: 0 days  . Minutes of Exercise per Session: 0 min  Stress: No Stress Concern Present  . Feeling of Stress : Only a little  Social Connections: Severely Isolated  . Frequency of Communication with Friends and Family: Never  . Frequency of Social Gatherings with Friends and Family: Never  . Attends Religious Services: Never  . Active Member of Clubs or  Organizations: No  . Attends Archivist Meetings: Never  . Marital Status: Never married  Intimate Partner Violence: Not At Risk  . Fear of Current or Ex-Partner: No  . Emotionally Abused: No  . Physically Abused: No  . Sexually Abused: No   Family History  Problem Relation Age of Onset  . COPD Mother   .  Depression Mother   . Hyperlipidemia Mother   . Hypertension Mother   . Heart disease Mother   . Thyroid disease Mother   . Anxiety disorder Mother   . Diabetes Father   . Pulmonary embolism Father 58       blood clot  . Hyperlipidemia Father   . Sudden death Father   . Obesity Father   . Eating disorder Father   . Hypertension Brother      OBJECTIVE:  Vitals:   10/24/19 1222 10/24/19 1239  BP: (!) 140/93   Pulse: 85   Resp: 16   Temp: 98.2 F (36.8 C)   TempSrc: Oral   SpO2: 98%   Weight:  260 lb (117.9 kg)  Height:  _0  (1.727 m)     General appearance: alert; no distress Skin: laceration of the top of right hand.  Redness on top of right hand with 0.5 cm diameter of laceration with scab Heart: RRR no murmur, rub or gallop Chest: CTA, no rales, rhonchi or wheezing Psychological: alert and cooperative; normal mood and affect   Results for orders placed or performed in visit on 02/15/19  Comprehensive metabolic panel  Result Value Ref Range   Glucose 136 (H) 65 - 99 mg/dL   BUN 19 6 - 24 mg/dL   Creatinine, Ser 1.42 (H) 0.76 - 1.27 mg/dL   GFR calc non Af Amer 58 (L) >59 mL/min/1.73   GFR calc Af Amer 67 >59 mL/min/1.73   BUN/Creatinine Ratio 13 9 - 20   Sodium 139 134 - 144 mmol/L   Potassium 4.5 3.5 - 5.2 mmol/L   Chloride 99 96 - 106 mmol/L   CO2 23 20 - 29 mmol/L   Calcium 9.5 8.7 - 10.2 mg/dL   Total Protein 7.0 6.0 - 8.5 g/dL   Albumin 3.8 (L) 4.0 - 5.0 g/dL   Globulin, Total 3.2 1.5 - 4.5 g/dL   Albumin/Globulin Ratio 1.2 1.2 - 2.2   Bilirubin Total 0.8 0.0 - 1.2 mg/dL   Alkaline Phosphatase 124 (H) 39 - 117 IU/L   AST 25 0 -  40 IU/L   ALT 24 0 - 44 IU/L  Hemoglobin A1c  Result Value Ref Range   Hgb A1c MFr Bld 6.4 (H) 4.8 - 5.6 %   Est. average glucose Bld gHb Est-mCnc 137 mg/dL  Insulin, random  Result Value Ref Range   INSULIN 17.7 2.6 - 24.9 uIU/mL  Lipid Panel With LDL/HDL Ratio  Result Value Ref Range   Cholesterol, Total 211 (H) 100 - 199 mg/dL   Triglycerides 220 (H) 0 - 149 mg/dL   HDL 37 (L) >39 mg/dL   VLDL Cholesterol Cal 40 5 - 40 mg/dL   LDL Chol Calc (NIH) 134 (H) 0 - 99 mg/dL   LDL/HDL Ratio 3.6 0.0 - 3.6 ratio  VITAMIN D 25 Hydroxy (Vit-D Deficiency, Fractures)  Result Value Ref Range   Vit D, 25-Hydroxy 29.2 (L) 30.0 - 100.0 ng/mL  T3  Result Value Ref Range   T3, Total 86 71 - 180 ng/dL  T4, free  Result Value Ref Range   Free T4 1.11 0.82 - 1.77 ng/dL  TSH  Result Value Ref Range   TSH 2.200 0.450 - 4.500 uIU/mL  Microalbumin / creatinine urine ratio  Result Value Ref Range   Creatinine, Urine 225.1 Not Estab. mg/dL   Microalbumin, Urine 589.2 Not Estab. ug/mL   Microalb/Creat Ratio 262 (H) 0 - 29 mg/g creat  Labs Reviewed - No data to display  No results found.  Procedure:   ASSESSMENT & PLAN:  1. Wound infection     Meds ordered this encounter  Medications  . cephALEXin (KEFLEX) 500 MG capsule    Sig: Take 1 capsule (500 mg total) by mouth 4 (four) times daily.    Dispense:  20 capsule    Refill:  0  . Tdap (BOOSTRIX) injection 0.5 mL   Discharge instructions Clean with warm water and mild soap.   Apply a thin layer of neosporin.  Take antibiotic as prescribed and to completion Return in 10 days to have sutures removed.   Take OTC ibuprofen or tylenol as needed for pain releif Return sooner or go to the ED if you have any new or worsening symptoms such as increased pain, redness, swelling, drainage, discharge, decreased range of motion of extremity, etc..     Reviewed expectations re: course of current medical issues. Questions answered. Outlined  signs and symptoms indicating need for more acute intervention. Patient verbalized understanding. After Visit Summary given.   Emerson Monte, FNP 10/24/19 1257    Emerson Monte, FNP 10/24/19 1258

## 2019-10-25 LAB — COMPREHENSIVE METABOLIC PANEL
ALT: 34 IU/L (ref 0–44)
AST: 30 IU/L (ref 0–40)
Albumin/Globulin Ratio: 1.2 (ref 1.2–2.2)
Albumin: 3.9 g/dL — ABNORMAL LOW (ref 4.0–5.0)
Alkaline Phosphatase: 142 IU/L — ABNORMAL HIGH (ref 48–121)
BUN/Creatinine Ratio: 18 (ref 9–20)
BUN: 30 mg/dL — ABNORMAL HIGH (ref 6–24)
Bilirubin Total: 0.6 mg/dL (ref 0.0–1.2)
CO2: 21 mmol/L (ref 20–29)
Calcium: 9.5 mg/dL (ref 8.7–10.2)
Chloride: 98 mmol/L (ref 96–106)
Creatinine, Ser: 1.66 mg/dL — ABNORMAL HIGH (ref 0.76–1.27)
GFR calc Af Amer: 55 mL/min/{1.73_m2} — ABNORMAL LOW (ref >59)
GFR calc non Af Amer: 47 mL/min/{1.73_m2} — ABNORMAL LOW (ref >59)
Globulin, Total: 3.2 g/dL (ref 1.5–4.5)
Glucose: 249 mg/dL — ABNORMAL HIGH (ref 65–99)
Potassium: 4.9 mmol/L (ref 3.5–5.2)
Sodium: 139 mmol/L (ref 134–144)
Total Protein: 7.1 g/dL (ref 6.0–8.5)

## 2019-10-25 LAB — SPECIMEN STATUS REPORT

## 2019-10-26 ENCOUNTER — Encounter: Payer: Self-pay | Admitting: Family Medicine

## 2019-10-26 ENCOUNTER — Other Ambulatory Visit: Payer: Self-pay | Admitting: Family Medicine

## 2019-10-26 DIAGNOSIS — N1831 Chronic kidney disease, stage 3a: Secondary | ICD-10-CM

## 2019-10-26 NOTE — Telephone Encounter (Signed)
Can someone update me on the pain clinic issue? Did anyone call the place in [place] yet to see what they might have needed from [name]? Thanks for help

## 2019-11-01 ENCOUNTER — Telehealth (INDEPENDENT_AMBULATORY_CARE_PROVIDER_SITE_OTHER): Payer: Medicaid Other | Admitting: Family Medicine

## 2019-11-01 ENCOUNTER — Other Ambulatory Visit: Payer: Self-pay

## 2019-11-01 ENCOUNTER — Encounter: Payer: Self-pay | Admitting: Family Medicine

## 2019-11-01 VITALS — BP 140/93 | Ht 68.0 in | Wt 260.0 lb

## 2019-11-01 DIAGNOSIS — Z6841 Body Mass Index (BMI) 40.0 and over, adult: Secondary | ICD-10-CM | POA: Diagnosis not present

## 2019-11-01 DIAGNOSIS — E1121 Type 2 diabetes mellitus with diabetic nephropathy: Secondary | ICD-10-CM | POA: Diagnosis not present

## 2019-11-01 MED ORDER — SEMAGLUTIDE(0.25 OR 0.5MG/DOS) 2 MG/1.5ML ~~LOC~~ SOPN: PEN_INJECTOR | SUBCUTANEOUS | 0 refills | Status: DC

## 2019-11-01 NOTE — Patient Instructions (Addendum)
I appreciate the opportunity to provide you with care for your health and wellness. Today we discussed: weight loss  Follow up: 4 weeks in office for weight check and repeat labs, needs A1c same day  Labs-must get tomorrow prior to start of new diet medication  No referrals today  Please continue to practice social distancing to keep you, your family, and our community safe.  If you must go out, please wear a mask and practice good handwashing.  It was a pleasure to see you and I look forward to continuing to work together on your health and well-being. Please do not hesitate to call the office if you need care or have questions about your care.  Have a wonderful day and week. With Gratitude, Cherly Beach, DNP, AGNP-BC

## 2019-11-01 NOTE — Assessment & Plan Note (Signed)
[name] has been seen at the healthy weight clinic however he is not been shown to be very compliant with his weight loss ability.  There was question as to whether or not it was orthopedic in nature secondary to pain.  We will try him on a new injection medication.   He needs to come into the office in 4 weeks for a weight check in addition to getting updated labs tomorrow so that he can start the medication and we will recheck him in 4 weeks.  I still encouraged him to do exercise as much as possible and focus on diet control.

## 2019-11-01 NOTE — Assessment & Plan Note (Signed)
Medication change. Semaglutide from [employer].  Educated on how to take the medication detailed specifically.  Updated labs ordered.  Updated appointment for an office check as well.

## 2019-11-01 NOTE — Progress Notes (Signed)
Virtual Visit via Telephone Note   This visit type was conducted due to national recommendations for restrictions regarding the COVID-19 Pandemic (e.g. social distancing) in an effort to limit this patient's exposure and mitigate transmission in our community.  Due to his co-morbid illnesses, this patient is at least at moderate risk for complications without adequate follow up.  This format is felt to be most appropriate for this patient at this time.  The patient did not have access to video technology/had technical difficulties with video requiring transitioning to audio format only (telephone).  All issues noted in this document were discussed and addressed.  No physical exam could be performed with this format.   Evaluation Performed:  Follow-up visit  Date:  11/01/2019   ID:  [name], DOB June 24, 1970, MRN [id number]  Patient Location: Home Provider Location: Office  Location of Patient: Home Location of Provider: Telehealth Consent was obtain for visit to be over via telehealth. I verified that I am speaking with the correct person using two identifiers.  PCP:  Perlie Mayo, NP   Chief Complaint: Weight loss  History of Present Illness:    [name] is a 49 y.o. male with history of obesity. He has been seen in the [facility]. He has not had much success with this. He has a goal of 260 so he can have hip surgery.  Devron is up to 279 pounds, he was 288 at his last visit. He reports eating more leafy greens and protein. Had injections for hip and that is helping him work out more. He wants to try another medication if able to see if that will help him.   The patient does not have symptoms concerning for COVID-19 infection (fever, chills, cough, or new shortness of breath).   Past Medical, Surgical, Social History, Allergies, and Medications have been Reviewed.  Past Medical History:  Diagnosis Date   Anemia    Arthritis    "hips"  (01/15/2015)   Blood transfusion without reported diagnosis    Chronic lower back pain    Colon polyps    Depression    denies   Diabetes mellitus    diet controlled- no meds since wt loss   Dysrhythmia    s/p surgery bariatric- cardioversion   GERD (gastroesophageal reflux disease)    Hip pain    History of gout    Hyperlipidemia    Hypertension    Joint pain    Neuromuscular disorder (HCC)    PONV (postoperative nausea and vomiting)    Pre-diabetes    Sleep apnea    no longer have to use cpap since wt loss   Stage 3 chronic kidney disease 01/26/2018   Stroke [facility]) 2006   denies residual on 01/15/2015   Past Surgical History:  Procedure Laterality Date   BARIATRIC SURGERY  2010   in [place] N/A 08/02/2014   Procedure: COLONOSCOPY;  Surgeon: Rogene Houston, MD;  Location: [facility];  Service: Endoscopy;  Laterality: N/A;  210 - moved to 3/10 @ 11:55 - Ann notified pt   ESOPHAGOGASTRODUODENOSCOPY     HERNIA REPAIR     LIPOSUCTION     PANNICULECTOMY  01/14/2015   PANNICULECTOMY N/A 01/14/2015   Procedure:  TOTAL PANNICULECTOMY WTH LIPOSUCTION OF SIDES;  Surgeon: Cristine Polio, MD;  Location: [place];  Service: Plastics;  Laterality: N/A;   TONSILLECTOMY  VENTRAL HERNIA REPAIR  01/14/2015   VENTRAL HERNIA REPAIR N/A 01/14/2015   Procedure: HERNIA REPAIR VENTRAL ADULT AND MUSCLE REPAIR;  Surgeon: Cristine Polio, MD;  Location: [place];  Service: Plastics;  Laterality: N/A;     Current Meds  Medication Sig   acetaminophen (TYLENOL) 500 MG tablet Take 1,000 mg by mouth every 6 (six) hours as needed for moderate pain.   blood glucose meter kit and supplies KIT Test daily Dx  r73.03   cyclobenzaprine (FLEXERIL) 5 MG tablet Take 5 mg by mouth 3 (three) times daily as needed.   latanoprost (XALATAN) 0.005 % ophthalmic solution Place 1 drop into both eyes at bedtime.   NARCAN 4 MG/0.1ML LIQD nasal spray kit  ADMINISTER A SINGLE SPRAYDOF NARCAN INTO ONE NOSTRIL. REPEAT AFTER 2 TO 3 MINUTES IN OTHER NOSTRIL IF NO RESPONSE.   Oxycodone HCl 20 MG TABS Take 1 tablet (20 mg total) by mouth 3 (three) times daily as needed.   sildenafil (VIAGRA) 25 MG tablet Take 1-2 tablets (25-50 mg total) by mouth daily as needed for erectile dysfunction.   timolol (BETIMOL) 0.5 % ophthalmic solution Apply 1 drop to eye daily.   timolol (TIMOPTIC) 0.5 % ophthalmic solution 1 drop every morning.     Allergies:   Patient has no known allergies.   ROS:   Please see the history of present illness.    All other systems reviewed and are negative.   Labs/Other Tests and Data Reviewed:    Recent Labs: 04/13/2019: TSH 1.81 10/24/2019: ALT 34; BUN 30; Creatinine, Ser 1.66; Potassium 4.9; Sodium 139   Recent Lipid Panel Lab Results  Component Value Date/Time   CHOL 223 (H) 04/13/2019 10:26 AM   CHOL 211 (H) 02/15/2019 10:50 AM   TRIG 338 (H) 04/13/2019 10:26 AM   HDL 34 (L) 04/13/2019 10:26 AM   HDL 37 (L) 02/15/2019 10:50 AM   CHOLHDL 6.6 (H) 04/13/2019 10:26 AM   LDLCALC 141 (H) 04/13/2019 10:26 AM    Wt Readings from Last 3 Encounters:  11/01/19 260 lb (117.9 kg)  10/24/19 260 lb (117.9 kg)  05/08/19 288 lb (130.6 kg)     Objective:    Vital Signs:  BP (!) 140/93    Ht 5' 8" (1.727 m)    Wt 260 lb (117.9 kg)    BMI 39.53 kg/m    VITAL SIGNS:  reviewed GEN:  Alert and oriented RESPIRATORY:  No shortness of breath noted in conversation PSYCH:  Normal affect and mood  ASSESSMENT & PLAN:    1. Class 3 severe obesity with serious comorbidity and body mass index (BMI) of 45.0 to 49.9 in adult, unspecified obesity type ([place])  - Semaglutide,0.25 or 0.5MG/DOS, 2 MG/1.5ML SOPN; Inject 0.1875 mLs (0.25 mg total) into the skin once a week for 28 days, THEN 0.375 mLs (0.5 mg total) once a week for 28 days, THEN 0.5625 mLs (0.75 mg total) once a week for 28 days, THEN 0.75 mLs (1 mg total) once a week for 28  days.  Dispense: 7.5 mL; Refill: 0 - CBC - COMPLETE METABOLIC PANEL WITH GFR  2. Type 2 diabetes mellitus with diabetic nephropathy, without long-term current use of insulin (HCC)  - CBC - COMPLETE METABOLIC PANEL WITH GFR   Time:   Today, I have spent 20 minutes with the patient with telehealth technology discussing the above problems.     Medication Adjustments/Labs and Tests Ordered: Current medicines are reviewed at length with the patient today.  Concerns regarding medicines are outlined above.   Tests Ordered: No orders of the defined types were placed in this encounter.   Medication Changes: No orders of the defined types were placed in this encounter.   Disposition:  Follow up 4 weeks in office  Signed, Perlie Mayo, NP  11/01/2019 2:07 PM     [facility]

## 2019-11-02 DIAGNOSIS — E1169 Type 2 diabetes mellitus with other specified complication: Secondary | ICD-10-CM | POA: Diagnosis not present

## 2019-11-02 DIAGNOSIS — Z6841 Body Mass Index (BMI) 40.0 and over, adult: Secondary | ICD-10-CM | POA: Diagnosis not present

## 2019-11-02 DIAGNOSIS — E1121 Type 2 diabetes mellitus with diabetic nephropathy: Secondary | ICD-10-CM | POA: Diagnosis not present

## 2019-11-03 ENCOUNTER — Other Ambulatory Visit: Payer: Self-pay | Admitting: *Deleted

## 2019-11-03 DIAGNOSIS — N183 Chronic kidney disease, stage 3 unspecified: Secondary | ICD-10-CM

## 2019-11-03 LAB — COMPLETE METABOLIC PANEL WITH GFR
AG Ratio: 1.2 (calc) (ref 1.0–2.5)
ALT: 26 U/L (ref 9–46)
AST: 20 U/L (ref 10–35)
Albumin: 3.8 g/dL (ref 3.6–5.1)
Alkaline phosphatase (APISO): 100 U/L (ref 35–144)
BUN/Creatinine Ratio: 22 (calc) (ref 6–22)
BUN: 35 mg/dL — ABNORMAL HIGH (ref 7–25)
CO2: 27 mmol/L (ref 20–32)
Calcium: 9.6 mg/dL (ref 8.6–10.3)
Chloride: 100 mmol/L (ref 98–110)
Creat: 1.62 mg/dL — ABNORMAL HIGH (ref 0.70–1.33)
GFR, Est African American: 57 mL/min/{1.73_m2} — ABNORMAL LOW (ref >=60)
GFR, Est Non African American: 49 mL/min/{1.73_m2} — ABNORMAL LOW (ref >=60)
Globulin: 3.1 g/dL (calc) (ref 1.9–3.7)
Glucose, Bld: 165 mg/dL — ABNORMAL HIGH (ref 65–139)
Potassium: 4.8 mmol/L (ref 3.5–5.3)
Sodium: 135 mmol/L (ref 135–146)
Total Bilirubin: 0.8 mg/dL (ref 0.2–1.2)
Total Protein: 6.9 g/dL (ref 6.1–8.1)

## 2019-11-03 LAB — CBC
HCT: 47.7 % (ref 38.5–50.0)
Hemoglobin: 16.2 g/dL (ref 13.2–17.1)
MCH: 30.7 pg (ref 27.0–33.0)
MCHC: 34 g/dL (ref 32.0–36.0)
MCV: 90.3 fL (ref 80.0–100.0)
MPV: 9.9 fL (ref 7.5–12.5)
Platelets: 225 10*3/uL (ref 140–400)
RBC: 5.28 10*6/uL (ref 4.20–5.80)
RDW: 13.3 % (ref 11.0–15.0)
WBC: 9.1 10*3/uL (ref 3.8–10.8)

## 2019-11-13 ENCOUNTER — Encounter: Payer: Self-pay | Admitting: Family Medicine

## 2019-11-14 NOTE — Telephone Encounter (Signed)
Did his insurance decline to cover it?

## 2019-11-15 NOTE — Telephone Encounter (Signed)
Noted, do we know why? He might need to call the insurance company and see what they cover for weight loss.

## 2019-11-17 ENCOUNTER — Encounter: Payer: Self-pay | Admitting: Family Medicine

## 2019-11-23 DIAGNOSIS — Z5181 Encounter for therapeutic drug level monitoring: Secondary | ICD-10-CM | POA: Diagnosis not present

## 2019-11-23 DIAGNOSIS — M25551 Pain in right hip: Secondary | ICD-10-CM | POA: Diagnosis not present

## 2019-11-23 DIAGNOSIS — M25552 Pain in left hip: Secondary | ICD-10-CM | POA: Diagnosis not present

## 2019-11-23 DIAGNOSIS — Z79899 Other long term (current) drug therapy: Secondary | ICD-10-CM | POA: Diagnosis not present

## 2019-11-29 ENCOUNTER — Other Ambulatory Visit: Payer: Self-pay

## 2019-11-29 ENCOUNTER — Encounter: Payer: Self-pay | Admitting: Family Medicine

## 2019-11-29 ENCOUNTER — Ambulatory Visit: Payer: Medicaid Other | Admitting: Family Medicine

## 2019-11-29 VITALS — BP 143/84 | HR 63 | Temp 97.4°F | Resp 18 | Ht 69.0 in | Wt 296.0 lb

## 2019-11-29 DIAGNOSIS — E785 Hyperlipidemia, unspecified: Secondary | ICD-10-CM | POA: Diagnosis not present

## 2019-11-29 DIAGNOSIS — Z6841 Body Mass Index (BMI) 40.0 and over, adult: Secondary | ICD-10-CM

## 2019-11-29 DIAGNOSIS — E1121 Type 2 diabetes mellitus with diabetic nephropathy: Secondary | ICD-10-CM

## 2019-11-29 LAB — POCT GLYCOSYLATED HEMOGLOBIN (HGB A1C)
HbA1c POC (<> result, manual entry): 7.4 % (ref 4.0–5.6)
HbA1c, POC (controlled diabetic range): 7.4 % — AB (ref 0.0–7.0)
HbA1c, POC (prediabetic range): 7.4 % — AB (ref 5.7–6.4)
Hemoglobin A1C: 7.4 % — AB (ref 4.0–5.6)

## 2019-11-29 MED ORDER — SEMAGLUTIDE(0.25 OR 0.5MG/DOS) 2 MG/1.5ML ~~LOC~~ SOPN: PEN_INJECTOR | SUBCUTANEOUS | 0 refills | Status: DC

## 2019-11-29 NOTE — Assessment & Plan Note (Signed)
Obesity has been a very big concern.  Is currently connected to type 2 diabetes and hypertension.  In addition to hyperlipidemia.  He is struggling with weight loss.  Needs to lose weight so that he can have hip surgery on his hips are very bad and has a lot of pain orthopedic in nature.  He reports that he has recently had injections that have helped him become a little bit more mobile and active.  He presents today for weight check and secondary to starting new medication however insurance did not cover the medication.  He will pick up new insurance and also we can refill it again today.  Reviewed medication and how it is administered.  In addition reviewed that I would like him to keep a food and exercise log diary and bring it in at his next appointment. Reviewed side effects, risks and benefits of medication.   Patient acknowledged agreement and understanding of the plan.

## 2019-11-29 NOTE — Assessment & Plan Note (Signed)
Needs updated labs.  Encouraged heart healthy diet.  And exercise.

## 2019-11-29 NOTE — Assessment & Plan Note (Signed)
Has not been taking medication for this.  A1c is up to 7.4 in office today.  We will try to put him on semaglutide to help with weight loss and diabetic control.  Previously was declined by insurance but is now picked up [employer].

## 2019-11-29 NOTE — Patient Instructions (Addendum)
I appreciate the opportunity to provide you with care for your health and wellness. Today we discussed: weight loss and DM   Follow up: 4 weeks with food and exercise log  Labs- fasting in 4 weeks prior to appt if able No referrals today  Start medication as directed on the container. We will increase at next appt.  Call if you have any issues or concerns.  Please continue to practice social distancing to keep you, your family, and our community safe.  If you must go out, please wear a mask and practice good handwashing.  It was a pleasure to see you and I look forward to continuing to work together on your health and well-being. Please do not hesitate to call the office if you need care or have questions about your care.  Have a wonderful day and week. With Gratitude, Cherly Beach, DNP, AGNP-BC

## 2019-11-29 NOTE — Progress Notes (Signed)
Subjective:  Patient ID: [name], male    DOB: 1971/02/20  Age: 49 y.o. MRN: [id number]  CC:  Chief Complaint  Patient presents with  . Follow-up    4 week follow up repeat labs A1C in office pt is doing ok got hip injections would like to discuss weight loss drug       HPI  HPI   [name] is a 49 year old male patient who presents today secondary to need for weight loss to have surgery.  As well as diabetic follow-up.  He has tried several things to help with his weight loss.  Most recently have been seen at the [facility] weight and wellness clinic.  He did not have much success with this.  His goal is 260 pounds/BMI under 40 so that he can have hip surgery.  He is currently up to 296 today in the office.  He reports that he is trying to eat better but he still falls back into the same routines with eating snacks.  He is trying not to buy them but he is just unsure why he keeps doing the things that he is doing.  He reports his fasting blood sugars usually range in the 140s to 150 range.  His nighttime blood sugars are 190-200 range.  He reports that he does not really think about sugar-free items still.  He has not kept a food diary.  But he is trying to exercise and rides his stationary bike at home.  He has tried therapy in the past but he says he was just tried to be put on medications which she does not want to be put on.  He is not against having some type of cognitive behavioral therapy if that were to come down to it.  At this time he would like to just try to see if there is an injectable medication that he can try secondary to also have diabetes.  Of note he did have injections in his hips that helped him tremendously and he is feeling much better now so he is hoping that with the injections he will be able to lose some weight so that he can have surgery.  He denies having any chest pain, palpitations, excessive leg swelling, cough, shortness of breath fevers chills or  exposure to Covid.  Today patient denies signs and symptoms of COVID 19 infection including fever, chills, cough, shortness of breath, and headache. Past Medical, Surgical, Social History, Allergies, and Medications have been Reviewed.   Past Medical History:  Diagnosis Date  . ALLERGY 08/03/2008   Qualifier: Diagnosis of  By: Christ Kick    . Anemia   . Arthritis    "hips" (01/15/2015)  . Blood transfusion without reported diagnosis   . Cellulitis 07/25/2018  . Chronic lower back pain   . Colon polyps   . CVA 08/03/2008   Qualifier: History of  By: Christ Kick    . Depression    denies  . Diabetes mellitus    diet controlled- no meds since wt loss  . Dysrhythmia    s/p surgery bariatric- cardioversion  . Erectile dysfunction 01/26/2018  . GERD (gastroesophageal reflux disease)   . Hip pain   . History of gout   . Hyperlipidemia   . Hypertension   . Joint pain   . Neuromuscular disorder ([place])   . PONV (postoperative nausea and vomiting)   . Pre-diabetes   . Prediabetes 03/29/2017  . Sleep apnea  no longer have to use cpap since wt loss  . Stage 3 chronic kidney disease 01/26/2018  . Stroke [facility]) 2006   denies residual on 01/15/2015    Current Meds  Medication Sig  . acetaminophen (TYLENOL) 500 MG tablet Take 1,000 mg by mouth every 6 (six) hours as needed for moderate pain.  . blood glucose meter kit and supplies KIT Test daily Dx  r73.03  . latanoprost (XALATAN) 0.005 % ophthalmic solution Place 1 drop into both eyes at bedtime.  Marland Kitchen NARCAN 4 MG/0.1ML LIQD nasal spray kit ADMINISTER A SINGLE SPRAYDOF NARCAN INTO ONE NOSTRIL. REPEAT AFTER 2 TO 3 MINUTES IN OTHER NOSTRIL IF NO RESPONSE.  Marland Kitchen timolol (BETIMOL) 0.5 % ophthalmic solution Apply 1 drop to eye daily.  . timolol (TIMOPTIC) 0.5 % ophthalmic solution 1 drop every morning.    ROS:  Review of Systems  Constitutional: Negative.        Weight loss needs  HENT: Negative.   Eyes: Negative.   Respiratory:  Negative.   Cardiovascular: Negative.   Gastrointestinal: Negative.   Genitourinary: Negative.   Musculoskeletal: Negative.   Skin: Negative.   Neurological: Negative.   Endo/Heme/Allergies: Negative.   Psychiatric/Behavioral: Negative.   All other systems reviewed and are negative.    Objective:   Today's Vitals: BP (!) 143/84 (BP Location: Right Arm, Patient Position: Sitting, Cuff Size: Normal)   Pulse 63   Temp (!) 97.4 F (36.3 C) (Temporal)   Resp 18   Ht '5\' 9"'  (1.753 m)   Wt 296 lb (134.3 kg)   SpO2 97%   BMI 43.71 kg/m  Vitals with BMI 11/29/2019 11/01/2019 10/24/2019  Height '5\' 9"'  '5\' 8"'  '5\' 8"'   Weight 296 lbs 260 lbs 260 lbs  BMI 43.69 45.62 56.38  Systolic 937 342 876  Diastolic 84 93 93  Pulse 63 - 85     Physical Exam Vitals and nursing note reviewed.  Constitutional:      Appearance: Normal appearance. He is well-developed and well-groomed. He is morbidly obese.  HENT:     Head: Normocephalic and atraumatic.     Right Ear: External ear normal.     Left Ear: External ear normal.  Eyes:     General:        Right eye: No discharge.        Left eye: No discharge.     Conjunctiva/sclera: Conjunctivae normal.  Cardiovascular:     Rate and Rhythm: Normal rate and regular rhythm.     Pulses: Normal pulses.     Heart sounds: Normal heart sounds.  Pulmonary:     Effort: Pulmonary effort is normal.     Breath sounds: Normal breath sounds.  Musculoskeletal:        General: Normal range of motion.     Cervical back: Normal range of motion and neck supple.  Skin:    General: Skin is warm.  Neurological:     General: No focal deficit present.     Mental Status: He is alert and oriented to person, place, and time.  Psychiatric:        Attention and Perception: Attention normal.        Mood and Affect: Mood normal.        Speech: Speech normal.        Behavior: Behavior normal. Behavior is cooperative.        Thought Content: Thought content normal.         Cognition and Memory: Cognition  normal.        Judgment: Judgment normal.     Comments: Good communication good eye contact      Assessment   1. Morbid obesity with body mass index (BMI) of 40.0 to 44.9 in adult [facility])   2. Type 2 diabetes mellitus with diabetic nephropathy, without long-term current use of insulin ([place])   3. Hyperlipidemia, unspecified hyperlipidemia type     Tests ordered Orders Placed This Encounter  Procedures  . CBC  . COMPLETE METABOLIC PANEL WITH GFR  . Lipid panel  . POCT glycosylated hemoglobin (Hb A1C)     Plan: Please see assessment and plan per problem list above.   Meds ordered this encounter  Medications  . Semaglutide,0.25 or 0.5MG/DOS, 2 MG/1.5ML SOPN    Sig: Inject 0.1875 mLs (0.25 mg total) into the skin once a week for 28 days, THEN 0.375 mLs (0.5 mg total) once a week for 28 days, THEN 0.5625 mLs (0.75 mg total) once a week for 28 days, THEN 0.75 mLs (1 mg total) once a week for 28 days.    Dispense:  7.5 mL    Refill:  0    Order Specific Question:   Supervising Provider    Answer:   Fayrene Helper [[id number]]    Patient to follow-up in 4 weeks   Perlie Mayo, NP

## 2019-12-20 DIAGNOSIS — E1121 Type 2 diabetes mellitus with diabetic nephropathy: Secondary | ICD-10-CM | POA: Diagnosis not present

## 2019-12-20 DIAGNOSIS — E785 Hyperlipidemia, unspecified: Secondary | ICD-10-CM | POA: Diagnosis not present

## 2019-12-21 ENCOUNTER — Other Ambulatory Visit: Payer: Self-pay | Admitting: Family Medicine

## 2019-12-21 DIAGNOSIS — E785 Hyperlipidemia, unspecified: Secondary | ICD-10-CM

## 2019-12-21 DIAGNOSIS — N183 Chronic kidney disease, stage 3 unspecified: Secondary | ICD-10-CM

## 2019-12-21 DIAGNOSIS — I10 Essential (primary) hypertension: Secondary | ICD-10-CM

## 2019-12-21 LAB — CBC
HCT: 42.5 % (ref 38.5–50.0)
Hemoglobin: 14.4 g/dL (ref 13.2–17.1)
MCH: 30.8 pg (ref 27.0–33.0)
MCHC: 33.9 g/dL (ref 32.0–36.0)
MCV: 90.8 fL (ref 80.0–100.0)
MPV: 10.3 fL (ref 7.5–12.5)
Platelets: 191 10*3/uL (ref 140–400)
RBC: 4.68 10*6/uL (ref 4.20–5.80)
RDW: 13.1 % (ref 11.0–15.0)
WBC: 9.5 10*3/uL (ref 3.8–10.8)

## 2019-12-21 LAB — LIPID PANEL
Cholesterol: 206 mg/dL — ABNORMAL HIGH (ref <200)
HDL: 34 mg/dL — ABNORMAL LOW (ref >=40)
LDL Cholesterol (Calc): 121 mg/dL (calc) — ABNORMAL HIGH
Non-HDL Cholesterol (Calc): 172 mg/dL (calc) — ABNORMAL HIGH (ref <130)
Total CHOL/HDL Ratio: 6.1 (calc) — ABNORMAL HIGH (ref <5.0)
Triglycerides: 354 mg/dL — ABNORMAL HIGH (ref <150)

## 2019-12-21 LAB — COMPLETE METABOLIC PANEL WITH GFR
AG Ratio: 1.4 (calc) (ref 1.0–2.5)
ALT: 25 U/L (ref 9–46)
AST: 23 U/L (ref 10–35)
Albumin: 3.7 g/dL (ref 3.6–5.1)
Alkaline phosphatase (APISO): 79 U/L (ref 35–144)
BUN/Creatinine Ratio: 20 (calc) (ref 6–22)
BUN: 30 mg/dL — ABNORMAL HIGH (ref 7–25)
CO2: 29 mmol/L (ref 20–32)
Calcium: 8.8 mg/dL (ref 8.6–10.3)
Chloride: 102 mmol/L (ref 98–110)
Creat: 1.53 mg/dL — ABNORMAL HIGH (ref 0.70–1.33)
GFR, Est African American: 61 mL/min/{1.73_m2} (ref >=60)
GFR, Est Non African American: 52 mL/min/{1.73_m2} — ABNORMAL LOW (ref >=60)
Globulin: 2.7 g/dL (calc) (ref 1.9–3.7)
Glucose, Bld: 126 mg/dL — ABNORMAL HIGH (ref 65–99)
Potassium: 4.6 mmol/L (ref 3.5–5.3)
Sodium: 138 mmol/L (ref 135–146)
Total Bilirubin: 0.8 mg/dL (ref 0.2–1.2)
Total Protein: 6.4 g/dL (ref 6.1–8.1)

## 2019-12-21 MED ORDER — ATORVASTATIN CALCIUM 10 MG PO TABS: 10.0000 mg | ORAL_TABLET | Freq: Every day | ORAL | 0 refills | Status: DC

## 2019-12-21 MED ORDER — LISINOPRIL 10 MG PO TABS: 10.0000 mg | ORAL_TABLET | Freq: Every day | ORAL | 0 refills | Status: DC

## 2019-12-27 ENCOUNTER — Ambulatory Visit: Payer: Medicaid Other | Admitting: Family Medicine

## 2019-12-27 ENCOUNTER — Encounter: Payer: Self-pay | Admitting: Family Medicine

## 2019-12-27 ENCOUNTER — Other Ambulatory Visit: Payer: Self-pay

## 2019-12-27 VITALS — BP 138/86 | HR 82 | Temp 97.6°F | Resp 18 | Ht 68.0 in | Wt 299.1 lb

## 2019-12-27 DIAGNOSIS — N183 Chronic kidney disease, stage 3 unspecified: Secondary | ICD-10-CM

## 2019-12-27 DIAGNOSIS — Z6841 Body Mass Index (BMI) 40.0 and over, adult: Secondary | ICD-10-CM

## 2019-12-27 NOTE — Assessment & Plan Note (Signed)
Obesity has been a very long-term concern.  Currently connected to type 2 diabetes and hypertension.  In addition to hyperlipidemia.  He has struggled for weight loss has had bariatric surgery.  He needs to lose weight so that he can have hip surgery.  As his hips are very bad and he has a lot of pain orthopedic in nature.  Is seen by pain management clinic to help with this.  He presents back today for follow-up for weight check and secondary to starting new medication. He was advised to bring in a food and exercise log diary.  He did not bring in exercise.  But he did bring in a food diary.  Reports he is doing his medications as directed.  And that his blood sugars have seemed actually improve a little bit.  His food log demonstrated that he picks a lot of high carbohydrate food choices even though they are healthier nature such as a chicken wrap instead of a chicken sandwich however review of carbohydrates in a tortilla, crackers, granola bars and the like was provided today in the office.  In addition to substitute that would be of benefit such as a lettuce wrap or for going the bread of a sandwich and just eating the inside part.  We will follow-up again in 4 weeks and see how he is doing.

## 2019-12-27 NOTE — Patient Instructions (Signed)
I appreciate the opportunity to provide you with care for your health and wellness. Today we discussed: Diet   Follow up: 4 weeks   Labs recheck 2 weeks Referrals today- Nephrology  Start focusing on how to avoid the breads and high carb food items in diet.  Increase activity level, aim of something daily.  Please continue to practice social distancing to keep you, your family, and our community safe.  If you must go out, please wear a mask and practice good handwashing.  It was a pleasure to see you and I look forward to continuing to work together on your health and well-being. Please do not hesitate to call the office if you need care or have questions about your care.  Have a wonderful day and week. With Gratitude, Cherly Beach, DNP, AGNP-BC

## 2019-12-27 NOTE — Assessment & Plan Note (Signed)
He has been encouraged to not use NSAIDs.  Lisinopril 10 mg daily.  However this might need to be stopped with recommendations to nephrology.  Has seen nephrology in the past.  Very worried whether or not this is diabetic neuropathy versus medication induced kidney injury. Given that he is a diabetic we will continue lisinopril at this time.

## 2019-12-27 NOTE — Progress Notes (Signed)
Subjective:  Patient ID: [name], male    DOB: 1970/06/26  Age: 49 y.o. MRN: [id number]  CC:  Chief Complaint  Patient presents with  . Follow-up    4 week follow up with food and exercise logs. Pt has gained 3 lbs since last time. He is very frustrated as he feels at though he isnt eating that much       HPI  HPI [name] is a 49 year old male patient of mine.  He presents today for follow-up on diet and lifestyle changes.  Needing help with weight management.  He brought in a food log that demonstrated while he is making good food choices he is still choosing more carb heavy items such as granola bars, crackers, chips at times, sandwich bread, tortilla wraps.  Most food choices were healthy outside of having some of these paired with it.  Reviewed extensively the diet with him and ways that he could adjust it over time.  He is very frustrated with the fact that he has gained weight.  He does know that he eats a little bit carb heavy.  He does has a very difficult time in making decisions that are little bit healthier for him.  He needs to get down to a goal of 260 for surgery.    I had asked him to also do some exercise however he reports he has not done any exercise I have encouraged him to do some chair exercises.  Walking as tolerated.  Additionally he has had some kidney function changes over the last several checks would like to get him in with the kidney doctor sooner than later.  Today patient denies signs and symptoms of COVID 19 infection including fever, chills, cough, shortness of breath, and headache. Past Medical, Surgical, Social History, Allergies, and Medications have been Reviewed.   Past Medical History:  Diagnosis Date  . ALLERGY 08/03/2008   Qualifier: Diagnosis of  By: Christ Kick    . Anemia   . Arthritis    "hips" (01/15/2015)  . Blood transfusion without reported diagnosis   . Cellulitis 07/25/2018  . Chronic back pain greater than 3 months  duration 02/11/2017  . Chronic lower back pain   . Colon polyps   . COLONIC POLYPS, ADENOMATOUS 08/03/2008   Qualifier: Diagnosis of  By: Christ Kick    . CVA 08/03/2008   Qualifier: History of  By: Christ Kick    . Depression    denies  . Diabetes mellitus    diet controlled- no meds since wt loss  . Diabetes mellitus ([place]) 07/10/2019  . Dysrhythmia    s/p surgery bariatric- cardioversion  . Erectile dysfunction 01/26/2018  . GERD (gastroesophageal reflux disease)   . HIATAL HERNIA 08/03/2008   Qualifier: Diagnosis of  By: Christ Kick    . Hip pain   . History of gout   . Hyperlipidemia   . Hypertension   . Joint pain   . Narcotic dependence ([place]) 02/11/2017  . Neuromuscular disorder ([place])   . PONV (postoperative nausea and vomiting)   . Pre-diabetes   . Prediabetes 03/29/2017  . Sleep apnea    no longer have to use cpap since wt loss  . Stage 3 chronic kidney disease 01/26/2018  . Stroke [facility]) 2006   denies residual on 01/15/2015  . 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 ([place]) 08/01/2018    Current Meds  Medication Sig  . acetaminophen (TYLENOL) 500 MG tablet  Take 1,000 mg by mouth every 6 (six) hours as needed for moderate pain.  Marland Kitchen atorvastatin (LIPITOR) 10 MG tablet Take 1 tablet (10 mg total) by mouth daily.  . blood glucose meter kit and supplies KIT Test daily Dx  r73.03  . ergocalciferol (VITAMIN D2) 1.25 MG (50000 UT) capsule Take by mouth.  . latanoprost (XALATAN) 0.005 % ophthalmic solution Place 1 drop into both eyes at bedtime.  Marland Kitchen lisinopril (ZESTRIL) 10 MG tablet Take 1 tablet (10 mg total) by mouth daily.  Marland Kitchen NARCAN 4 MG/0.1ML LIQD nasal spray kit ADMINISTER A SINGLE SPRAYDOF NARCAN INTO ONE NOSTRIL. REPEAT AFTER 2 TO 3 MINUTES IN OTHER NOSTRIL IF NO RESPONSE.  Marland Kitchen Oxycodone HCl 20 MG TABS Take 1 tablet by mouth every 6 (six) hours as needed.  . Semaglutide,0.25 or 0.5MG/DOS, 2 MG/1.5ML SOPN Inject 0.1875 mLs (0.25 mg total) into the skin once a  week for 28 days, THEN 0.375 mLs (0.5 mg total) once a week for 28 days, THEN 0.5625 mLs (0.75 mg total) once a week for 28 days, THEN 0.75 mLs (1 mg total) once a week for 28 days.  Marland Kitchen timolol (BETIMOL) 0.5 % ophthalmic solution Apply 1 drop to eye daily.  . timolol (TIMOPTIC) 0.5 % ophthalmic solution 1 drop every morning.    ROS:  Review of Systems  Constitutional: Negative.   HENT: Negative.   Eyes: Negative.   Respiratory: Negative.   Cardiovascular: Negative.   Gastrointestinal: Negative.   Genitourinary: Negative.   Musculoskeletal: Negative.   Skin: Negative.   Neurological: Negative.   Endo/Heme/Allergies: Negative.   Psychiatric/Behavioral: Negative.   All other systems reviewed and are negative.    Objective:   Today's Vitals: BP 138/86 (BP Location: Right Arm, Patient Position: Sitting, Cuff Size: Normal)   Pulse 82   Temp 97.6 F (36.4 C) (Temporal)   Resp 18   Ht '5\' 8"'  (1.727 m)   Wt 299 lb 1.9 oz (135.7 kg)   SpO2 98%   BMI 45.48 kg/m  Vitals with BMI 12/27/2019 11/29/2019 11/01/2019  Height '5\' 8"'  '5\' 9"'  '5\' 8"'   Weight 299 lbs 2 oz 296 lbs 260 lbs  BMI 45.49 25.42 70.62  Systolic 376 283 151  Diastolic 86 84 93  Pulse 82 63 -     Physical Exam Vitals and nursing note reviewed.  Constitutional:      Appearance: Normal appearance. He is well-developed and well-groomed. He is morbidly obese.  HENT:     Head: Normocephalic and atraumatic.     Right Ear: External ear normal.     Left Ear: External ear normal.     Mouth/Throat:     Comments: Mask in place  Eyes:     General:        Right eye: No discharge.        Left eye: No discharge.     Conjunctiva/sclera: Conjunctivae normal.  Cardiovascular:     Rate and Rhythm: Normal rate and regular rhythm.     Pulses: Normal pulses.     Heart sounds: Normal heart sounds.  Pulmonary:     Effort: Pulmonary effort is normal.     Breath sounds: Normal breath sounds.  Musculoskeletal:        General: Normal  range of motion.     Cervical back: Normal range of motion and neck supple.  Skin:    General: Skin is warm.  Neurological:     General: No focal deficit present.  Mental Status: He is alert and oriented to person, place, and time.  Psychiatric:        Attention and Perception: Attention and perception normal.        Mood and Affect: Mood normal.        Speech: Speech normal.        Behavior: Behavior normal. Behavior is cooperative.        Thought Content: Thought content normal.        Cognition and Memory: Cognition and memory normal.        Judgment: Judgment normal.     Depression screen [facility] 2/9 12/27/2019 11/29/2019 11/01/2019 04/19/2019 02/15/2019  Decreased Interest 0 0 0 0 1  Down, Depressed, Hopeless 0 0 0 0 1  PHQ - 2 Score 0 0 0 0 2  Altered sleeping - - 0 - 2  Tired, decreased energy - - 0 - 2  Change in appetite - - 0 - 2  Feeling bad or failure about yourself  - - 0 - 2  Trouble concentrating - - 0 - 1  Moving slowly or fidgety/restless - - 0 - 0  Suicidal thoughts - - 0 - 0  PHQ-9 Score - - 0 - 11  Difficult doing work/chores Not difficult at all - Not difficult at all - Somewhat difficult     Assessment   1. Morbid obesity with body mass index (BMI) of 40.0 to 44.9 in adult [facility])   2. Stage 3 chronic kidney disease, unspecified whether stage 3a or 3b CKD     Tests ordered Orders Placed This Encounter  Procedures  . Comprehensive metabolic panel  . Ambulatory referral to Nephrology     Plan: Please see assessment and plan per problem list above.   No orders of the defined types were placed in this encounter.   Patient to follow-up in 01/24/2020.  Perlie Mayo, NP

## 2020-01-02 DIAGNOSIS — M25552 Pain in left hip: Secondary | ICD-10-CM | POA: Diagnosis not present

## 2020-01-02 DIAGNOSIS — M25551 Pain in right hip: Secondary | ICD-10-CM | POA: Diagnosis not present

## 2020-01-05 ENCOUNTER — Other Ambulatory Visit: Payer: Self-pay | Admitting: Family Medicine

## 2020-01-05 DIAGNOSIS — Z6841 Body Mass Index (BMI) 40.0 and over, adult: Secondary | ICD-10-CM

## 2020-01-05 MED ORDER — SEMAGLUTIDE(0.25 OR 0.5MG/DOS) 2 MG/1.5ML ~~LOC~~ SOPN: PEN_INJECTOR | SUBCUTANEOUS | 0 refills | Status: DC

## 2020-01-06 ENCOUNTER — Encounter: Payer: Self-pay | Admitting: Family Medicine

## 2020-01-09 ENCOUNTER — Other Ambulatory Visit: Payer: Self-pay

## 2020-01-09 DIAGNOSIS — E1121 Type 2 diabetes mellitus with diabetic nephropathy: Secondary | ICD-10-CM

## 2020-01-09 MED ORDER — GLUCOSE BLOOD VI STRP: ORAL_STRIP | 2 refills | Status: DC

## 2020-01-09 NOTE — Telephone Encounter (Signed)
Please order, thank you 

## 2020-01-10 DIAGNOSIS — N183 Chronic kidney disease, stage 3 unspecified: Secondary | ICD-10-CM | POA: Diagnosis not present

## 2020-01-11 LAB — COMPLETE METABOLIC PANEL WITH GFR
AG Ratio: 1.4 (calc) (ref 1.0–2.5)
ALT: 20 U/L (ref 9–46)
AST: 20 U/L (ref 10–35)
Albumin: 3.8 g/dL (ref 3.6–5.1)
Alkaline phosphatase (APISO): 79 U/L (ref 35–144)
BUN/Creatinine Ratio: 19 (calc) (ref 6–22)
BUN: 38 mg/dL — ABNORMAL HIGH (ref 7–25)
CO2: 25 mmol/L (ref 20–32)
Calcium: 9.4 mg/dL (ref 8.6–10.3)
Chloride: 103 mmol/L (ref 98–110)
Creat: 2.05 mg/dL — ABNORMAL HIGH (ref 0.70–1.33)
GFR, Est African American: 43 mL/min/{1.73_m2} — ABNORMAL LOW (ref >=60)
GFR, Est Non African American: 37 mL/min/{1.73_m2} — ABNORMAL LOW (ref >=60)
Globulin: 2.8 g/dL (calc) (ref 1.9–3.7)
Glucose, Bld: 130 mg/dL (ref 65–139)
Potassium: 4.4 mmol/L (ref 3.5–5.3)
Sodium: 137 mmol/L (ref 135–146)
Total Bilirubin: 0.8 mg/dL (ref 0.2–1.2)
Total Protein: 6.6 g/dL (ref 6.1–8.1)

## 2020-01-12 ENCOUNTER — Telehealth: Payer: Self-pay

## 2020-01-12 NOTE — Telephone Encounter (Signed)
Returning a call for a medication that he is being taken off of

## 2020-01-12 NOTE — Telephone Encounter (Signed)
Patient is aware he has to stop the semaglutide due to worsening kidney function

## 2020-01-22 DIAGNOSIS — M25551 Pain in right hip: Secondary | ICD-10-CM | POA: Diagnosis not present

## 2020-01-22 DIAGNOSIS — M160 Bilateral primary osteoarthritis of hip: Secondary | ICD-10-CM | POA: Diagnosis not present

## 2020-01-22 DIAGNOSIS — M25552 Pain in left hip: Secondary | ICD-10-CM | POA: Diagnosis not present

## 2020-01-24 ENCOUNTER — Other Ambulatory Visit: Payer: Self-pay

## 2020-01-24 ENCOUNTER — Telehealth (INDEPENDENT_AMBULATORY_CARE_PROVIDER_SITE_OTHER): Payer: Medicaid Other | Admitting: Family Medicine

## 2020-01-24 ENCOUNTER — Encounter: Payer: Self-pay | Admitting: Family Medicine

## 2020-01-24 DIAGNOSIS — N183 Chronic kidney disease, stage 3 unspecified: Secondary | ICD-10-CM | POA: Diagnosis not present

## 2020-01-24 DIAGNOSIS — Z6841 Body Mass Index (BMI) 40.0 and over, adult: Secondary | ICD-10-CM | POA: Diagnosis not present

## 2020-01-24 NOTE — Assessment & Plan Note (Signed)
Obesity for the hypertension, hyperlipidemia, diabetes  Improved  He is encouraged to continue making the food choices that he is been making.  As he has had some success a pound weight loss in the last month.  Encouraged chair exercises as tolerated.  30 pounds to go for surgery goal of 260 pounds.  Wt Readings from Last 3 Encounters:  01/24/20 291 lb (132 kg)  12/27/19 299 lb 1.9 oz (135.7 kg)  11/29/19 296 lb (134.3 kg)

## 2020-01-24 NOTE — Assessment & Plan Note (Signed)
Referral was made to nephrology.  Had to stop his weight loss injectable medication secondary to increased worsening kidney function.  He has follow-up appointment here soon.  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  He thinks that that might be part of the cause.

## 2020-01-24 NOTE — Patient Instructions (Signed)
I appreciate the opportunity to provide you with care for your health and wellness. Today we discussed: Weight management  Follow up: 6-8 weeks for weight management follow-up  No labs or referrals today  Great job on losing 8 pounds this last month!  That is wonderful! Continue to the changes you have started for food choices.  Please continue to practice social distancing to keep you, your family, and our community safe.  If you must go out, please wear a mask and practice good handwashing.  It was a pleasure to see you and I look forward to continuing to work together on your health and well-being. Please do not hesitate to call the office if you need care or have questions about your care.  Have a wonderful day and week. With Gratitude, Cherly Beach, DNP, AGNP-BC

## 2020-01-24 NOTE — Progress Notes (Signed)
Virtual Visit via Telephone Note   This visit type was conducted due to national recommendations for restrictions regarding the COVID-19 Pandemic (e.g. social distancing) in an effort to limit this patient's exposure and mitigate transmission in our community.  Due to his co-morbid illnesses, this patient is at least at moderate risk for complications without adequate follow up.  This format is felt to be most appropriate for this patient at this time.  The patient did not have access to video technology/had technical difficulties with video requiring transitioning to audio format only (telephone).  All issues noted in this document were discussed and addressed.  No physical exam could be performed with this format.    Evaluation Performed:  Follow-up visit  Date:  01/24/2020   ID:  [name], DOB 1970/08/06, MRN [id number]  Patient Location: Home Provider Location: Office/Clinic  Location of Patient: Home Location of Provider: Telehealth Consent was obtain for visit to be over via telehealth. I verified that I am speaking with the correct person using two identifiers.  PCP:  Perlie Mayo, NP   Chief Complaint: Weight loss follow-up  History of Present Illness:    [name] is a 49 y.o. male with history of arthritis in hips, depression, diabetes, obesity. He presents today for follow-up on diet and lifestyle changes, weight loss.  He is needing help with weight management so that he can get to surgery goal up to 260.  He reports that he still trying to make good food choices.  Is no longer eating granola bars and meal replacement bars for snacks.  Trying to be more conscious of carb heavy foods.  Extensive review of food choices at last visit at the beginning of August.  He has lost 8 pounds since that time.  He reports that he is trying to do better.  Has not been able to do many exercises secondary to discomfort.  Additionally had to stop his injectable medication for  weight loss secondary to kidney function changes.  He reports taking all his medications as directed.  He has been trying to drink water more.  Denies having any other issues or concerns today.  The patient does not have symptoms concerning for COVID-19 infection (fever, chills, cough, or new shortness of breath).   Past Medical, Surgical, Social History, Allergies, and Medications have been Reviewed.  Past Medical History:  Diagnosis Date  . ALLERGY 08/03/2008   Qualifier: Diagnosis of  By: Christ Kick    . Anemia   . Arthritis    "hips" (01/15/2015)  . Blood transfusion without reported diagnosis   . Cellulitis 07/25/2018  . Chronic back pain greater than 3 months duration 02/11/2017  . Chronic lower back pain   . Colon polyps   . COLONIC POLYPS, ADENOMATOUS 08/03/2008   Qualifier: Diagnosis of  By: Christ Kick    . CVA 08/03/2008   Qualifier: History of  By: Christ Kick    . Depression    denies  . Diabetes mellitus    diet controlled- no meds since wt loss  . Diabetes mellitus ([place]) 07/10/2019  . Dysrhythmia    s/p surgery bariatric- cardioversion  . Erectile dysfunction 01/26/2018  . GERD (gastroesophageal reflux disease)   . HIATAL HERNIA 08/03/2008   Qualifier: Diagnosis of  By: Christ Kick    . Hip pain   . History of gout   . Hyperlipidemia   . Hypertension   . Joint pain   . Narcotic  dependence ([place]) 02/11/2017  . Neuromuscular disorder ([place])   . PONV (postoperative nausea and vomiting)   . Pre-diabetes   . Prediabetes 03/29/2017  . Sleep apnea    no longer have to use cpap since wt loss  . Stage 3 chronic kidney disease 01/26/2018  . Stroke [facility]) 2006   denies residual on 01/15/2015  . 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 ([place]) 08/01/2018   Past Surgical History:  Procedure Laterality Date  . BARIATRIC SURGERY  2010   in [place]  . CARDIOVERSION    . COLONOSCOPY N/A 08/02/2014   Procedure: COLONOSCOPY;  Surgeon: Rogene Houston, MD;   [facility];  Service: Endoscopy;  Laterality: N/A;  210 - moved to 3/10 @ 11:55 - Ann notified pt  . ESOPHAGOGASTRODUODENOSCOPY    . HERNIA REPAIR    . LIPOSUCTION    . PANNICULECTOMY  01/14/2015  . PANNICULECTOMY N/A 01/14/2015   Procedure:  TOTAL PANNICULECTOMY WTH LIPOSUCTION OF SIDES;  Surgeon: Cristine Polio, MD;  Location: [place];  Service: Plastics;  Laterality: N/A;  . TONSILLECTOMY    . VENTRAL HERNIA REPAIR  01/14/2015  . VENTRAL HERNIA REPAIR N/A 01/14/2015   Procedure: HERNIA REPAIR VENTRAL ADULT AND MUSCLE REPAIR;  Surgeon: Cristine Polio, MD;  Location: [place];  Service: Plastics;  Laterality: N/A;     No outpatient medications have been marked as taking for the 01/24/20 encounter (Video Visit) with Perlie Mayo, NP.     Allergies:   Patient has no known allergies.   ROS:   Please see the history of present illness.    All other systems reviewed and are negative.   Labs/Other Tests and Data Reviewed:    Recent Labs: 04/13/2019: TSH 1.81 12/20/2019: Hemoglobin 14.4; Platelets 191 01/10/2020: ALT 20; BUN 38; Creat 2.05; Potassium 4.4; Sodium 137   Recent Lipid Panel Lab Results  Component Value Date/Time   CHOL 206 (H) 12/20/2019 10:11 AM   CHOL 211 (H) 02/15/2019 10:50 AM   TRIG 354 (H) 12/20/2019 10:11 AM   HDL 34 (L) 12/20/2019 10:11 AM   HDL 37 (L) 02/15/2019 10:50 AM   CHOLHDL 6.1 (H) 12/20/2019 10:11 AM   LDLCALC 121 (H) 12/20/2019 10:11 AM    Wt Readings from Last 3 Encounters:  01/24/20 291 lb (132 kg)  12/27/19 299 lb 1.9 oz (135.7 kg)  11/29/19 296 lb (134.3 kg)     Objective:    Vital Signs:  BP 138/86   Ht 5\' 8"  (1.727 m)   Wt 291 lb (132 kg)   BMI 44.25 kg/m    VITAL SIGNS:  reviewed GEN:  Alert and oriented RESPIRATORY:  No shortness of breath noted in conversation PSYCH:  normal affect  ASSESSMENT & PLAN:     1. Morbid obesity with body mass index (BMI) of 40.0 to 44.9 in adult [facility])   2. Stage 3 chronic kidney  disease, unspecified whether stage 3a or 3b CKD  Time:   Today, I have spent 10 minutes with the patient with telehealth technology discussing the above problems.     Medication Adjustments/Labs and Tests Ordered: Current medicines are reviewed at length with the patient today.  Concerns regarding medicines are outlined above.   Tests Ordered: No orders of the defined types were placed in this encounter.   Medication Changes: No orders of the defined types were placed in this encounter.   Disposition:  Follow up 6-8 weeks for weight loss  Signed, Jarrett Soho  [name], NP  01/24/2020 10:31 AM     [place]

## 2020-01-31 ENCOUNTER — Other Ambulatory Visit (HOSPITAL_COMMUNITY): Payer: Self-pay | Admitting: Nephrology

## 2020-01-31 ENCOUNTER — Other Ambulatory Visit: Payer: Self-pay | Admitting: Nephrology

## 2020-01-31 DIAGNOSIS — E1129 Type 2 diabetes mellitus with other diabetic kidney complication: Secondary | ICD-10-CM

## 2020-01-31 DIAGNOSIS — I10 Essential (primary) hypertension: Secondary | ICD-10-CM

## 2020-01-31 DIAGNOSIS — E559 Vitamin D deficiency, unspecified: Secondary | ICD-10-CM

## 2020-01-31 DIAGNOSIS — E1122 Type 2 diabetes mellitus with diabetic chronic kidney disease: Secondary | ICD-10-CM

## 2020-02-01 DIAGNOSIS — M160 Bilateral primary osteoarthritis of hip: Secondary | ICD-10-CM | POA: Diagnosis not present

## 2020-02-07 DIAGNOSIS — E1122 Type 2 diabetes mellitus with diabetic chronic kidney disease: Secondary | ICD-10-CM | POA: Diagnosis not present

## 2020-02-07 DIAGNOSIS — I5032 Chronic diastolic (congestive) heart failure: Secondary | ICD-10-CM | POA: Diagnosis not present

## 2020-02-07 DIAGNOSIS — R809 Proteinuria, unspecified: Secondary | ICD-10-CM | POA: Diagnosis not present

## 2020-02-07 DIAGNOSIS — Z79899 Other long term (current) drug therapy: Secondary | ICD-10-CM | POA: Diagnosis not present

## 2020-02-07 DIAGNOSIS — E559 Vitamin D deficiency, unspecified: Secondary | ICD-10-CM | POA: Diagnosis not present

## 2020-02-07 DIAGNOSIS — I129 Hypertensive chronic kidney disease with stage 1 through stage 4 chronic kidney disease, or unspecified chronic kidney disease: Secondary | ICD-10-CM | POA: Diagnosis not present

## 2020-02-07 DIAGNOSIS — N189 Chronic kidney disease, unspecified: Secondary | ICD-10-CM | POA: Diagnosis not present

## 2020-02-07 DIAGNOSIS — E1129 Type 2 diabetes mellitus with other diabetic kidney complication: Secondary | ICD-10-CM | POA: Diagnosis not present

## 2020-02-08 ENCOUNTER — Other Ambulatory Visit: Payer: Self-pay

## 2020-02-08 ENCOUNTER — Ambulatory Visit (HOSPITAL_COMMUNITY)
Admission: RE | Admit: 2020-02-08 | Discharge: 2020-02-08 | Disposition: A | Discharge status: Home / Self Care | Payer: Medicaid Other | Source: Ambulatory Visit | Attending: Nephrology | Admitting: Nephrology

## 2020-02-08 ENCOUNTER — Other Ambulatory Visit: Payer: Self-pay | Admitting: Family Medicine

## 2020-02-08 DIAGNOSIS — I10 Essential (primary) hypertension: Secondary | ICD-10-CM | POA: Diagnosis not present

## 2020-02-08 DIAGNOSIS — E559 Vitamin D deficiency, unspecified: Secondary | ICD-10-CM | POA: Insufficient documentation

## 2020-02-08 DIAGNOSIS — R809 Proteinuria, unspecified: Secondary | ICD-10-CM | POA: Diagnosis not present

## 2020-02-08 DIAGNOSIS — N183 Chronic kidney disease, stage 3 unspecified: Secondary | ICD-10-CM

## 2020-02-08 DIAGNOSIS — N189 Chronic kidney disease, unspecified: Secondary | ICD-10-CM | POA: Diagnosis not present

## 2020-02-08 DIAGNOSIS — E1129 Type 2 diabetes mellitus with other diabetic kidney complication: Secondary | ICD-10-CM | POA: Insufficient documentation

## 2020-02-08 DIAGNOSIS — N200 Calculus of kidney: Secondary | ICD-10-CM | POA: Diagnosis not present

## 2020-02-08 DIAGNOSIS — E785 Hyperlipidemia, unspecified: Secondary | ICD-10-CM

## 2020-02-08 DIAGNOSIS — E1122 Type 2 diabetes mellitus with diabetic chronic kidney disease: Secondary | ICD-10-CM | POA: Insufficient documentation

## 2020-02-08 MED ORDER — LISINOPRIL 10 MG PO TABS: 10.0000 mg | ORAL_TABLET | Freq: Every day | ORAL | 0 refills | Status: DC

## 2020-02-08 MED ORDER — ATORVASTATIN CALCIUM 10 MG PO TABS: 10.0000 mg | ORAL_TABLET | Freq: Every day | ORAL | 0 refills | Status: DC

## 2020-02-22 DIAGNOSIS — M1612 Unilateral primary osteoarthritis, left hip: Secondary | ICD-10-CM | POA: Diagnosis not present

## 2020-03-06 ENCOUNTER — Encounter: Payer: Self-pay | Admitting: Family Medicine

## 2020-03-06 ENCOUNTER — Other Ambulatory Visit: Payer: Self-pay

## 2020-03-06 ENCOUNTER — Ambulatory Visit (INDEPENDENT_AMBULATORY_CARE_PROVIDER_SITE_OTHER): Payer: Medicaid Other | Admitting: Family Medicine

## 2020-03-06 VITALS — BP 138/76 | HR 94 | Ht 68.0 in | Wt 291.0 lb

## 2020-03-06 DIAGNOSIS — N1832 Chronic kidney disease, stage 3b: Secondary | ICD-10-CM

## 2020-03-06 DIAGNOSIS — Z6841 Body Mass Index (BMI) 40.0 and over, adult: Secondary | ICD-10-CM | POA: Diagnosis not present

## 2020-03-06 DIAGNOSIS — M160 Bilateral primary osteoarthritis of hip: Secondary | ICD-10-CM | POA: Diagnosis not present

## 2020-03-06 DIAGNOSIS — Z23 Encounter for immunization: Secondary | ICD-10-CM | POA: Diagnosis not present

## 2020-03-06 DIAGNOSIS — E1121 Type 2 diabetes mellitus with diabetic nephropathy: Secondary | ICD-10-CM | POA: Diagnosis not present

## 2020-03-06 NOTE — Assessment & Plan Note (Signed)

## 2020-03-06 NOTE — Assessment & Plan Note (Signed)
Obesity is linked to HTN, HLD, DM  Stable  Encouraged to follow diabetic and kidney friendly diet. Continue to focus on diet control, and exercise as hips allow. GOAL is stil 260 for hip sx.

## 2020-03-06 NOTE — Progress Notes (Signed)
Subjective:  Patient ID: [name], male    DOB: [birth year]  Age: 49 y.o. MRN: [id number]  CC:  Chief Complaint  Patient presents with  . Follow-up    weight check      HPI  HPI   [name] is a 49 y.o. male with history of arthritis in hips, depression, diabetes, obesity. He presents today for follow-up on diet and lifestyle changes, weight loss.  He is needing help with weight management so that he can get to surgery goal up to 260.  He reports that he still trying to make good food choices.  Is no longer eating granola bars and meal replacement bars for snacks. He also had to make food and diet adjustments due to kidney function. He is down about this cause he feels things are stacked against him for weight loss. He is getting injections in his hips to help with pain in the mean time and they help him with mobility.   Due to kidney function we had to stop his injectable medication for weight loss. He is not being followed closely by Dr Theador Hawthorne. He has a follow up for/on [name] and labs tomorrow.   Denies having any other issues or concerns today.  Today patient denies signs and symptoms of COVID 19 infection including fever, chills, cough, shortness of breath, and headache. Past Medical, Surgical, Social History, Allergies, and Medications have been Reviewed.   Past Medical History:  Diagnosis Date  . ALLERGY 08/03/2008   Qualifier: Diagnosis of  By: [name]    . Anemia   . Arthritis    "hips" (01/15/2015)  . Blood transfusion without reported diagnosis   . Cellulitis 07/25/2018  . Chronic back pain greater than 3 months duration 02/11/2017  . Chronic lower back pain   . Colon polyps   . COLONIC POLYPS, ADENOMATOUS 08/03/2008   Qualifier: Diagnosis of  By: [name]    . CVA 08/03/2008   Qualifier: History of  By: [name]    . Depression    denies  . Diabetes mellitus    diet controlled- no meds since wt loss  . Diabetes mellitus  ([place]) 07/10/2019  . Dysrhythmia    s/p surgery bariatric- cardioversion  . Erectile dysfunction 01/26/2018  . GERD (gastroesophageal reflux disease)   . HIATAL HERNIA 08/03/2008   Qualifier: Diagnosis of  By: [name]    . Hip pain   . History of gout   . Hyperlipidemia   . Hypertension   . Joint pain   . Narcotic dependence ([place]) 02/11/2017  . Neuromuscular disorder ([place])   . PONV (postoperative nausea and vomiting)   . Pre-diabetes   . Prediabetes 03/29/2017  . Sleep apnea    no longer have to use cpap since wt loss  . Stage 3 chronic kidney disease ([place]) 01/26/2018  . Stroke [facility]) 2006   denies residual on 01/15/2015  . 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 ([place]) 08/01/2018    Current Meds  Medication Sig  . acetaminophen (TYLENOL) 500 MG tablet Take 1,000 mg by mouth every 6 (six) hours as needed for moderate pain.  Marland Kitchen atorvastatin (LIPITOR) 10 MG tablet Take 1 tablet (10 mg total) by mouth daily.  . blood glucose meter kit and supplies KIT Test daily Dx  r73.03  . glucose blood test strip Use as instructed  . latanoprost (XALATAN) 0.005 % ophthalmic solution Place 1 drop into both eyes at bedtime.  Marland Kitchen  latanoprost (XALATAN) 0.005 % ophthalmic solution Apply to eye.  Marland Kitchen lisinopril (ZESTRIL) 10 MG tablet Take 1 tablet (10 mg total) by mouth daily.  . Multiple Vitamins-Minerals (MULTIVITAMIN WITH MINERALS) tablet Take by mouth.  Karma Greaser 4 MG/0.1ML LIQD nasal spray kit ADMINISTER A SINGLE SPRAYDOF NARCAN INTO ONE NOSTRIL. REPEAT AFTER 2 TO 3 MINUTES IN OTHER NOSTRIL IF NO RESPONSE.  Marland Kitchen Oxycodone HCl 20 MG TABS Take 1 tablet by mouth every 6 (six) hours as needed.  . timolol (BETIMOL) 0.5 % ophthalmic solution Apply 1 drop to eye daily.  . [DISCONTINUED] timolol (TIMOPTIC) 0.5 % ophthalmic solution 1 drop every morning.    ROS:  Review of Systems  Constitutional: Negative.   HENT: Negative.   Eyes: Negative.   Respiratory: Negative.   Cardiovascular: Negative.     Gastrointestinal: Negative.   Genitourinary: Negative.   Musculoskeletal: Negative.   Skin: Negative.   Neurological: Negative.   Endo/Heme/Allergies: Negative.   Psychiatric/Behavioral: Negative.      Objective:   Today's Vitals: BP 138/76 (BP Location: Right Arm, Patient Position: Sitting, Cuff Size: Large)   Pulse 94   Ht _0  (1.727 m)   Wt 291 lb (132 kg)   SpO2 96%   BMI 44.25 kg/m  Vitals with BMI 03/06/2020 01/24/2020 12/27/2019  Height _1  _2  _3   Weight 291 lbs 291 lbs 299 lbs 2 oz  BMI 44.26 67.12 45.80  Systolic 998 338 250  Diastolic 76 86 86  Pulse 94 - 82     Physical Exam Vitals and nursing note reviewed.  Constitutional:      Appearance: Normal appearance. He is well-developed and well-groomed. He is morbidly obese.  HENT:     Head: Normocephalic and atraumatic.     Right Ear: External ear normal.     Left Ear: External ear normal.     Mouth/Throat:     Comments: Mask in place  Eyes:     General:        Right eye: No discharge.        Left eye: No discharge.     Conjunctiva/sclera: Conjunctivae normal.  Cardiovascular:     Rate and Rhythm: Normal rate and regular rhythm.     Pulses: Normal pulses.     Heart sounds: Normal heart sounds.  Pulmonary:     Effort: Pulmonary effort is normal.     Breath sounds: Normal breath sounds.  Musculoskeletal:        General: Normal range of motion.     Cervical back: Normal range of motion and neck supple.  Skin:    General: Skin is warm.  Neurological:     General: No focal deficit present.     Mental Status: He is alert and oriented to person, place, and time.  Psychiatric:        Attention and Perception: Attention normal.        Mood and Affect: Mood normal.        Speech: Speech normal.        Behavior: Behavior normal. Behavior is cooperative.        Thought Content: Thought content normal.        Cognition and Memory: Cognition normal.        Judgment: Judgment normal.     Comments:  Noted down mood     .phq  Assessment   1. Morbid obesity with body mass index (BMI) of 40.0 to 44.9 in adult [facility])   2.  Stage 3b chronic kidney disease ([place])   3. Type 2 diabetes mellitus with diabetic nephropathy, without long-term current use of insulin ([place])   4. Need for immunization against influenza   5. Primary osteoarthritis of both hips     Tests ordered Orders Placed This Encounter  Procedures  . Flu Vaccine QUAD 36+ mos IM     Plan: Please see assessment and plan per problem list above.   No orders of the defined types were placed in this encounter.   Patient to follow-up in 3 months .  Note: This dictation was prepared with Dragon dictation along with smaller phrase technology. Similar sounding words can be transcribed inadequately or may not be corrected upon review. Any transcriptional errors that result from this process are unintentional.      Perlie Mayo, NP

## 2020-03-06 NOTE — Assessment & Plan Note (Signed)
Limited ability to use injection meds to help with both DM and wt loss due to kidneys Is now following with Dr Theador Hawthorne. Last A1c was 7.4% will recheck at next appt. Encouraged diet and lifestyle changes.

## 2020-03-06 NOTE — Patient Instructions (Signed)
°  HAPPY FALL!  I appreciate the opportunity to provide you with care for your health and wellness. Today we discussed: weight and kidenys   Follow up: 3 months  No labs or referrals today  Jahid, please stay positive as you continue to try to lose weight and work on protecting your kidney's  Make the best choices and try to walk often while hips are pain free.   Please continue to practice social distancing to keep you, your family, and our community safe.  If you must go out, please wear a mask and practice good handwashing.  It was a pleasure to see you and I look forward to continuing to work together on your health and well-being. Please do not hesitate to call the office if you need care or have questions about your care.  Have a wonderful day and week. With Gratitude, Cherly Beach, DNP, AGNP-BC

## 2020-03-06 NOTE — Assessment & Plan Note (Signed)
He has has labs, and an ultrasound. Has follow up tomorrow with Dr Theador Hawthorne. I am worried about his kidneys worsening function given his younger age.  He reports it gets him down as well. He is working on diet changes to help. Advise to follow Dr Sharmaine Base advise

## 2020-03-06 NOTE — Assessment & Plan Note (Signed)
Has been getting injections in the hips to help with pain. They are working so far, it does help him be more mobile.

## 2020-03-08 ENCOUNTER — Other Ambulatory Visit: Payer: Self-pay | Admitting: Family Medicine

## 2020-03-08 DIAGNOSIS — E785 Hyperlipidemia, unspecified: Secondary | ICD-10-CM

## 2020-03-11 MED ORDER — ATORVASTATIN CALCIUM 10 MG PO TABS: 10.0000 mg | ORAL_TABLET | Freq: Every day | ORAL | 0 refills | Status: DC

## 2020-03-19 ENCOUNTER — Other Ambulatory Visit: Payer: Self-pay | Admitting: Family Medicine

## 2020-03-19 ENCOUNTER — Encounter: Payer: Self-pay | Admitting: Family Medicine

## 2020-03-19 DIAGNOSIS — N529 Male erectile dysfunction, unspecified: Secondary | ICD-10-CM

## 2020-03-19 MED ORDER — SILDENAFIL CITRATE 50 MG PO TABS: 50.0000 mg | ORAL_TABLET | Freq: Every day | ORAL | 0 refills | Status: DC | PRN

## 2020-03-27 ENCOUNTER — Encounter (INDEPENDENT_AMBULATORY_CARE_PROVIDER_SITE_OTHER): Payer: Self-pay | Admitting: *Deleted

## 2020-03-27 DIAGNOSIS — M25551 Pain in right hip: Secondary | ICD-10-CM | POA: Diagnosis not present

## 2020-03-27 DIAGNOSIS — M25552 Pain in left hip: Secondary | ICD-10-CM | POA: Diagnosis not present

## 2020-05-06 DIAGNOSIS — E611 Iron deficiency: Secondary | ICD-10-CM | POA: Diagnosis not present

## 2020-05-06 DIAGNOSIS — N170 Acute kidney failure with tubular necrosis: Secondary | ICD-10-CM | POA: Diagnosis not present

## 2020-05-06 DIAGNOSIS — R809 Proteinuria, unspecified: Secondary | ICD-10-CM | POA: Diagnosis not present

## 2020-05-06 DIAGNOSIS — D696 Thrombocytopenia, unspecified: Secondary | ICD-10-CM | POA: Diagnosis not present

## 2020-05-06 DIAGNOSIS — E1122 Type 2 diabetes mellitus with diabetic chronic kidney disease: Secondary | ICD-10-CM | POA: Diagnosis not present

## 2020-05-06 DIAGNOSIS — E1129 Type 2 diabetes mellitus with other diabetic kidney complication: Secondary | ICD-10-CM | POA: Diagnosis not present

## 2020-05-06 DIAGNOSIS — N189 Chronic kidney disease, unspecified: Secondary | ICD-10-CM | POA: Diagnosis not present

## 2020-05-06 DIAGNOSIS — R768 Other specified abnormal immunological findings in serum: Secondary | ICD-10-CM | POA: Diagnosis not present

## 2020-05-09 DIAGNOSIS — N189 Chronic kidney disease, unspecified: Secondary | ICD-10-CM | POA: Diagnosis not present

## 2020-05-09 DIAGNOSIS — D696 Thrombocytopenia, unspecified: Secondary | ICD-10-CM | POA: Diagnosis not present

## 2020-05-09 DIAGNOSIS — E1129 Type 2 diabetes mellitus with other diabetic kidney complication: Secondary | ICD-10-CM | POA: Diagnosis not present

## 2020-05-09 DIAGNOSIS — I5032 Chronic diastolic (congestive) heart failure: Secondary | ICD-10-CM | POA: Diagnosis not present

## 2020-05-09 DIAGNOSIS — R809 Proteinuria, unspecified: Secondary | ICD-10-CM | POA: Diagnosis not present

## 2020-05-09 DIAGNOSIS — I129 Hypertensive chronic kidney disease with stage 1 through stage 4 chronic kidney disease, or unspecified chronic kidney disease: Secondary | ICD-10-CM | POA: Diagnosis not present

## 2020-05-09 DIAGNOSIS — E1122 Type 2 diabetes mellitus with diabetic chronic kidney disease: Secondary | ICD-10-CM | POA: Diagnosis not present

## 2020-05-14 ENCOUNTER — Encounter: Payer: Self-pay | Admitting: Family Medicine

## 2020-05-14 NOTE — Telephone Encounter (Signed)
I am unable to prescribe medications without appts. Thank you.

## 2020-05-21 ENCOUNTER — Ambulatory Visit: Payer: Medicaid Other | Admitting: Family Medicine

## 2020-05-21 ENCOUNTER — Encounter: Payer: Self-pay | Admitting: Family Medicine

## 2020-05-21 ENCOUNTER — Other Ambulatory Visit: Payer: Self-pay

## 2020-05-21 VITALS — BP 148/88 | HR 102 | Temp 97.6°F | Ht 68.0 in | Wt 293.0 lb

## 2020-05-21 DIAGNOSIS — M62838 Other muscle spasm: Secondary | ICD-10-CM | POA: Diagnosis not present

## 2020-05-21 MED ORDER — CYCLOBENZAPRINE HCL 10 MG PO TABS: 10.0000 mg | ORAL_TABLET | Freq: Two times a day (BID) | ORAL | 0 refills | Status: DC | PRN

## 2020-05-21 NOTE — Progress Notes (Signed)
Subjective:  Patient ID: [name], male    DOB: [birth year]  Age: 49 y.o. MRN: [id number]  CC:  Chief Complaint  Patient presents with  . Leg Pain    Chronic leg pain, worsening x2 weeks. Has recently started working again.      HPI  Leg Pain  The incident occurred more than 1 week ago. There was no injury mechanism. The quality of the pain is described as aching and shooting. The pain is moderate. The pain has been worsening since onset. Associated symptoms include muscle weakness. He reports no foreign bodies present. The symptoms are aggravated by movement. He has tried nothing for the symptoms. The treatment provided no relief.  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 Since starting he had noticed increased leg pain bilaterally which he thinks it is related to being more active. He knows his weight is part of the issue, and he continues to work on that.  No other complaints today.  Today patient denies signs and symptoms of COVID 19 infection including fever, chills, cough, shortness of breath, and headache. Past Medical, Surgical, Social History, Allergies, and Medications have been Reviewed.   Past Medical History:  Diagnosis Date  . ALLERGY 08/03/2008   Qualifier: Diagnosis of  By: Christ Kick    . Anemia   . Arthritis    "hips" (01/15/2015)  . Blood transfusion without reported diagnosis   . Cellulitis 07/25/2018  . Chronic back pain greater than 3 months duration 02/11/2017  . Chronic lower back pain   . Colon polyps   . COLONIC POLYPS, ADENOMATOUS 08/03/2008   Qualifier: Diagnosis of  By: Christ Kick    . CVA 08/03/2008   Qualifier: History of  By: Christ Kick    . Depression    denies  . Diabetes mellitus    diet controlled- no meds since wt loss  . Diabetes mellitus ([place]) 07/10/2019  . Dysrhythmia    s/p surgery bariatric- cardioversion  . Erectile dysfunction 01/26/2018  . GERD (gastroesophageal reflux disease)   .  HIATAL HERNIA 08/03/2008   Qualifier: Diagnosis of  By: Christ Kick    . Hip pain   . History of gout   . Hyperlipidemia   . Hypertension   . Joint pain   . Narcotic dependence ([place]) 02/11/2017  . Neuromuscular disorder ([place])   . PONV (postoperative nausea and vomiting)   . Pre-diabetes   . Prediabetes 03/29/2017  . Sleep apnea    no longer have to use cpap since wt loss  . Stage 3 chronic kidney disease ([place]) 01/26/2018  . Stroke [facility]) 2006   denies residual on 01/15/2015  . 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 ([place]) 08/01/2018    Current Meds  Medication Sig  . acetaminophen (TYLENOL) 500 MG tablet Take 1,000 mg by mouth every 6 (six) hours as needed for moderate pain.  Marland Kitchen atorvastatin (LIPITOR) 10 MG tablet Take 1 tablet (10 mg total) by mouth daily.  . blood glucose meter kit and supplies KIT Test daily Dx  r73.03  . cyclobenzaprine (FLEXERIL) 10 MG tablet Take 1 tablet (10 mg total) by mouth 2 (two) times daily as needed for muscle spasms.  Marland Kitchen glucose blood test strip Use as instructed  . latanoprost (XALATAN) 0.005 % ophthalmic solution Place 1 drop into both eyes at bedtime.  Marland Kitchen latanoprost (XALATAN) 0.005 % ophthalmic solution Apply to eye.  Marland Kitchen lisinopril (ZESTRIL) 10 MG tablet  Take 1 tablet (10 mg total) by mouth daily.  Marland Kitchen NARCAN 4 MG/0.1ML LIQD nasal spray kit ADMINISTER A SINGLE SPRAYDOF NARCAN INTO ONE NOSTRIL. REPEAT AFTER 2 TO 3 MINUTES IN OTHER NOSTRIL IF NO RESPONSE.  Marland Kitchen Oxycodone HCl 20 MG TABS Take 1 tablet by mouth every 6 (six) hours as needed.  . sildenafil (VIAGRA) 50 MG tablet Take 1 tablet (50 mg total) by mouth daily as needed for erectile dysfunction.  . timolol (BETIMOL) 0.5 % ophthalmic solution Apply 1 drop to eye daily.    ROS:  Review of Systems  Constitutional: Negative.   HENT: Negative.   Eyes: Negative.   Respiratory: Negative.   Cardiovascular: Negative.   Gastrointestinal: Negative.   Genitourinary: Negative.   Musculoskeletal:  Positive for myalgias.  Skin: Negative.   Neurological: Negative.   Endo/Heme/Allergies: Negative.   Psychiatric/Behavioral: Negative.      Objective:   Today's Vitals: BP (!) 148/88 (BP Location: Right Arm, Patient Position: Sitting, Cuff Size: Normal)   Pulse (!) 102   Temp 97.6 F (36.4 C) (Temporal)   Ht '5\' 8"'  (1.727 m)   Wt 293 lb (132.9 kg)   SpO2 96%   BMI 44.55 kg/m  Vitals with BMI 05/21/2020 03/06/2020 01/24/2020  Height '5\' 8"'  '5\' 8"'  '5\' 8"'   Weight 293 lbs 291 lbs 291 lbs  BMI 44.56 24.82 50.03  Systolic 704 888 916  Diastolic 88 76 86  Pulse 945 94 -     Physical Exam Vitals and nursing note reviewed.  Constitutional:      Appearance: Normal appearance. He is well-developed and well-groomed. He is obese.  HENT:     Head: Normocephalic and atraumatic.     Right Ear: External ear normal.     Left Ear: External ear normal.     Mouth/Throat:     Comments: Mask in place  Eyes:     General:        Right eye: No discharge.        Left eye: No discharge.     Conjunctiva/sclera: Conjunctivae normal.  Cardiovascular:     Rate and Rhythm: Normal rate and regular rhythm.     Pulses: Normal pulses.     Heart sounds: Normal heart sounds.  Pulmonary:     Effort: Pulmonary effort is normal.     Breath sounds: Normal breath sounds.  Musculoskeletal:        General: Normal range of motion.     Cervical back: Normal range of motion and neck supple.     Comments: MAE, ROM intact   Skin:    General: Skin is warm.  Neurological:     General: No focal deficit present.     Mental Status: He is alert and oriented to person, place, and time.  Psychiatric:        Attention and Perception: Attention normal.        Mood and Affect: Mood normal.        Speech: Speech normal.        Behavior: Behavior normal. Behavior is cooperative.        Thought Content: Thought content normal.        Cognition and Memory: Cognition normal.        Judgment: Judgment normal.      Assessment   1. Muscle spasms of both lower extremities     Tests ordered No orders of the defined types were placed in this encounter.    Plan: Please see assessment  and plan per problem list above.   Meds ordered this encounter  Medications  . cyclobenzaprine (FLEXERIL) 10 MG tablet    Sig: Take 1 tablet (10 mg total) by mouth 2 (two) times daily as needed for muscle spasms.    Dispense:  60 tablet    Refill:  0    Order Specific Question:   Supervising Provider    Answer:   Jacklynn Bue    Patient to follow-up in 12/2020 for CPE    Perlie Mayo, NP

## 2020-05-21 NOTE — Patient Instructions (Addendum)
I appreciate the opportunity to provide you with care for your health and wellness. Today we discussed: muscle spasms  Follow up: Aug for CPE -fasting labs same day   No labs or referrals today  Hope the flexeril will help some. The more you move around the better the muscles will be and will respond.  Continue to focus on diet and exercise as tolerated to help with weight loss.   Please continue to practice social distancing to keep you, your family, and our community safe.  If you must go out, please wear a mask and practice good handwashing.  It was a pleasure to see you and I look forward to continuing to work together on your health and well-being. Please do not hesitate to call the office if you need care or have questions about your care.  Have a wonderful day. With Gratitude, Tereasa Coop, DNP, AGNP-BC

## 2020-05-23 DIAGNOSIS — M62838 Other muscle spasm: Secondary | ICD-10-CM | POA: Insufficient documentation

## 2020-05-23 NOTE — Assessment & Plan Note (Signed)
Trying flexeril to see if that will help. Advised to talk to pain management dr for follow up if it continues.  Reviewed side effects, risks and benefits of medication.   Patient acknowledged agreement and understanding of the plan.

## 2020-06-03 ENCOUNTER — Telehealth (INDEPENDENT_AMBULATORY_CARE_PROVIDER_SITE_OTHER): Payer: Self-pay

## 2020-06-03 ENCOUNTER — Ambulatory Visit (INDEPENDENT_AMBULATORY_CARE_PROVIDER_SITE_OTHER): Payer: Medicaid Other | Admitting: Gastroenterology

## 2020-06-03 NOTE — Telephone Encounter (Signed)
Noted  

## 2020-06-03 NOTE — Telephone Encounter (Signed)
Patient no showed for his appointment on 06/03/2020 with Dr. Jenetta Downer.

## 2020-06-19 DIAGNOSIS — Z79899 Other long term (current) drug therapy: Secondary | ICD-10-CM | POA: Diagnosis not present

## 2020-06-19 DIAGNOSIS — M25551 Pain in right hip: Secondary | ICD-10-CM | POA: Diagnosis not present

## 2020-06-19 DIAGNOSIS — M25552 Pain in left hip: Secondary | ICD-10-CM | POA: Diagnosis not present

## 2020-06-19 DIAGNOSIS — Z5181 Encounter for therapeutic drug level monitoring: Secondary | ICD-10-CM | POA: Diagnosis not present

## 2020-06-26 DIAGNOSIS — M1612 Unilateral primary osteoarthritis, left hip: Secondary | ICD-10-CM | POA: Diagnosis not present

## 2020-07-07 ENCOUNTER — Encounter: Payer: Self-pay | Admitting: Emergency Medicine

## 2020-07-07 ENCOUNTER — Other Ambulatory Visit: Payer: Self-pay

## 2020-07-07 ENCOUNTER — Ambulatory Visit
Admission: EM | Admit: 2020-07-07 | Discharge: 2020-07-07 | Disposition: A | Discharge status: Home / Self Care | Payer: Medicaid Other | Attending: Emergency Medicine | Admitting: Emergency Medicine

## 2020-07-07 DIAGNOSIS — L02211 Cutaneous abscess of abdominal wall: Secondary | ICD-10-CM

## 2020-07-07 MED ORDER — SULFAMETHOXAZOLE-TRIMETHOPRIM 800-160 MG PO TABS: 1.0000 | ORAL_TABLET | Freq: Two times a day (BID) | ORAL | 0 refills | Status: AC

## 2020-07-07 MED ORDER — CEFTRIAXONE SODIUM 1 G IJ SOLR
1.0000 g | Freq: Once | INTRAMUSCULAR | Status: AC
  Administered 2020-07-07: 1 g via INTRAMUSCULAR

## 2020-07-07 NOTE — ED Provider Notes (Signed)
[facility]   [id number] 07/07/20 Arrival Time: 4098   [id number]  SUBJECTIVE:  [name] is a 50 y.o. male who presented to the urgent care with a complaint of abscess to right lower abdomen for the past 3 days.  Area is  red and painful to touch.  Denies purulent drainage at this time.  Has tried OTC medication without relief.  Reports similar symptoms in the past that improved with Bactrim DS and Rocephin IM.  Denies any aggravating factor.  Denies chills, fever, nausea, vomiting, diarrhea.  ROS: As per HPI.  All other pertinent ROS negative.      Past Medical History:  Diagnosis Date  . ALLERGY 08/03/2008   Qualifier: Diagnosis of  By: Christ Kick    . Anemia   . Arthritis    "hips" (01/15/2015)  . Blood transfusion without reported diagnosis   . Cellulitis 07/25/2018  . Chronic back pain greater than 3 months duration 02/11/2017  . Chronic lower back pain   . Colon polyps   . COLONIC POLYPS, ADENOMATOUS 08/03/2008   Qualifier: Diagnosis of  By: Christ Kick    . CVA 08/03/2008   Qualifier: History of  By: Christ Kick    . Depression    denies  . Diabetes mellitus    diet controlled- no meds since wt loss  . Diabetes mellitus ([place]) 07/10/2019  . Dysrhythmia    s/p surgery bariatric- cardioversion  . Erectile dysfunction 01/26/2018  . GERD (gastroesophageal reflux disease)   . HIATAL HERNIA 08/03/2008   Qualifier: Diagnosis of  By: Christ Kick    . Hip pain   . History of gout   . Hyperlipidemia   . Hypertension   . Joint pain   . Narcotic dependence ([place]) 02/11/2017  . Neuromuscular disorder ([place])   . PONV (postoperative nausea and vomiting)   . Pre-diabetes   . Prediabetes 03/29/2017  . Sleep apnea    no longer have to use cpap since wt loss  . Stage 3 chronic kidney disease ([place]) 01/26/2018  . Stroke [facility]) 2006   denies residual on 01/15/2015  . 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 ([place]) 08/01/2018   Past Surgical History:   Procedure Laterality Date  . BARIATRIC SURGERY  2010   in [place]  . CARDIOVERSION    . COLONOSCOPY N/A 08/02/2014   Procedure: COLONOSCOPY;  Surgeon: Rogene Houston, MD;  [facility];  Service: Endoscopy;  Laterality: N/A;  210 - moved to 3/10 @ 11:55 - Ann notified pt  . ESOPHAGOGASTRODUODENOSCOPY    . HERNIA REPAIR    . LIPOSUCTION    . PANNICULECTOMY  01/14/2015  . PANNICULECTOMY N/A 01/14/2015   Procedure:  TOTAL PANNICULECTOMY WTH LIPOSUCTION OF SIDES;  Surgeon: Cristine Polio, MD;  Location: [facility];  Service: Plastics;  Laterality: N/A;  . TONSILLECTOMY    . VENTRAL HERNIA REPAIR  01/14/2015  . VENTRAL HERNIA REPAIR N/A 01/14/2015   Procedure: HERNIA REPAIR VENTRAL ADULT AND MUSCLE REPAIR;  Surgeon: Cristine Polio, MD;  Location: [facility];  Service: Plastics;  Laterality: N/A;   No Known Allergies No current facility-administered medications on file prior to encounter.   Current Outpatient Medications on File Prior to Encounter  Medication Sig Dispense Refill  . acetaminophen (TYLENOL) 500 MG tablet Take 1,000 mg by mouth every 6 (six) hours as needed for moderate pain.    Marland Kitchen atorvastatin (LIPITOR) 10 MG tablet Take 1 tablet (10 mg total) by mouth  daily. 90 tablet 0  . blood glucose meter kit and supplies KIT Test daily Dx  r73.03 1 each 0  . cyclobenzaprine (FLEXERIL) 10 MG tablet Take 1 tablet (10 mg total) by mouth 2 (two) times daily as needed for muscle spasms. 60 tablet 0  . glucose blood test strip Use as instructed 100 each 2  . latanoprost (XALATAN) 0.005 % ophthalmic solution Place 1 drop into both eyes at bedtime.    Marland Kitchen latanoprost (XALATAN) 0.005 % ophthalmic solution Apply to eye.    Marland Kitchen lisinopril (ZESTRIL) 10 MG tablet Take 1 tablet (10 mg total) by mouth daily. 90 tablet 0  . Multiple Vitamins-Minerals (MULTIVITAMIN WITH MINERALS) tablet Take by mouth.    Karma Greaser 4 MG/0.1ML LIQD nasal spray kit ADMINISTER A SINGLE SPRAYDOF NARCAN INTO ONE NOSTRIL.  REPEAT AFTER 2 TO 3 MINUTES IN OTHER NOSTRIL IF NO RESPONSE.    Marland Kitchen Oxycodone HCl 20 MG TABS Take 1 tablet by mouth every 6 (six) hours as needed.    . sildenafil (VIAGRA) 50 MG tablet Take 1 tablet (50 mg total) by mouth daily as needed for erectile dysfunction. 20 tablet 0  . timolol (BETIMOL) 0.5 % ophthalmic solution Apply 1 drop to eye daily.     Social History   Socioeconomic History  . Marital status: Single    Spouse name: Not on file  . Number of children: 0  . Years of education: 74  . Highest education level: Not on file  Occupational History  . Not on file  Tobacco Use  . Smoking status: Never Smoker  . Smokeless tobacco: Never Used  Vaping Use  . Vaping Use: Never used  Substance and Sexual Activity  . Alcohol use: No  . Drug use: No  . Sexual activity: Yes    Birth control/protection: Condom  Other Topics Concern  . 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      Enjoy races, eat out, watch movies, listen music, reads some      Diet: Snacks a lot, tried to avoid red meat, does eat chicken and [place], eats a lot of carbs and breads (chips), and sweets   Caffeine: drinks a can monster daily   Water: 3-4 bottles 16.9 oz      Wear seat belt   Wear sunscreen   Smoke and carbon monoxide detectors    Reports not using phone when driving   Social Determinants of Health   Financial Resource Strain: Not on file  Food Insecurity: Not on file  Transportation Needs: Not on file  Physical Activity: Not on file  Stress: Not on file  Social Connections: Not on file  Intimate Partner Violence: Not on file   Family History  Problem Relation Age of Onset  . COPD Mother   . Depression Mother   . Hyperlipidemia Mother   . Hypertension Mother   . Heart disease Mother   . Thyroid disease Mother   . Anxiety disorder Mother   . Diabetes Father   . Pulmonary embolism Father 87       blood clot  . Hyperlipidemia Father   . Sudden death Father    . Obesity Father   . Eating disorder Father   . Hypertension Brother     OBJECTIVE:  Vitals:   07/07/20 1306  BP: 138/86  Pulse: (!) 101  Resp: 19  Temp: 98.1 F (36.7 C)  TempSrc: Oral  SpO2: 98%  General appearance: alert; no distress Heart: RRR, no rub, gallop or murmur Chest: CTA, heart sounds normal Skin: 2 cm induration of his right lower abdomen; tender to touch; no active drainage Psychological: alert and cooperative; normal mood and affect    ASSESSMENT & PLAN:  1. Cutaneous abscess of abdominal wall     Meds ordered this encounter  Medications  . cefTRIAXone (ROCEPHIN) injection 1 g  . sulfamethoxazole-trimethoprim (BACTRIM DS) 800-160 MG tablet    Sig: Take 1 tablet by mouth 2 (two) times daily for 7 days.    Dispense:  14 tablet    Refill:  0    Discharge instructions  Rocephin IM was given in office Bactrim DS was prescribed May use warm compress 3-4 times daily for 10 to 15 minutes Wash site daily with warm water and mild soap Keep covered to avoid friction Take antibiotic as prescribed and to completion Follow up here or with PCP if symptoms persists Return or go to the ED if you have any new or worsening symptoms increased redness, swelling, pain, nausea, vomiting, fever, chills, etc...    Reviewed expectations re: course of current medical issues. Questions answered. Outlined signs and symptoms indicating need for more acute intervention. Patient verbalized understanding. After Visit Summary given.          Emerson Monte, FNP 07/07/20 1318

## 2020-07-07 NOTE — Discharge Instructions (Addendum)
Rocephin IM was given in office Bactrim DS was prescribed May use warm compress 3-4 times daily for 10 to 15 minutes Wash site daily with warm water and mild soap Keep covered to avoid friction Take antibiotic as prescribed and to completion Follow up here or with PCP if symptoms persists Return or go to the ED if you have any new or worsening symptoms increased redness, swelling, pain, nausea, vomiting, fever, chills, etc..Marland Kitchen

## 2020-07-07 NOTE — ED Triage Notes (Signed)
Abscess to RT lower abd that started Friday morning.  Area is red and painful

## 2020-07-19 DIAGNOSIS — M160 Bilateral primary osteoarthritis of hip: Secondary | ICD-10-CM | POA: Diagnosis not present

## 2020-07-22 DIAGNOSIS — N189 Chronic kidney disease, unspecified: Secondary | ICD-10-CM | POA: Diagnosis not present

## 2020-07-22 DIAGNOSIS — E559 Vitamin D deficiency, unspecified: Secondary | ICD-10-CM | POA: Diagnosis not present

## 2020-07-22 DIAGNOSIS — D696 Thrombocytopenia, unspecified: Secondary | ICD-10-CM | POA: Diagnosis not present

## 2020-07-22 DIAGNOSIS — R809 Proteinuria, unspecified: Secondary | ICD-10-CM | POA: Diagnosis not present

## 2020-07-22 DIAGNOSIS — E1122 Type 2 diabetes mellitus with diabetic chronic kidney disease: Secondary | ICD-10-CM | POA: Diagnosis not present

## 2020-07-22 DIAGNOSIS — I129 Hypertensive chronic kidney disease with stage 1 through stage 4 chronic kidney disease, or unspecified chronic kidney disease: Secondary | ICD-10-CM | POA: Diagnosis not present

## 2020-07-22 DIAGNOSIS — E1129 Type 2 diabetes mellitus with other diabetic kidney complication: Secondary | ICD-10-CM | POA: Diagnosis not present

## 2020-07-22 DIAGNOSIS — I5032 Chronic diastolic (congestive) heart failure: Secondary | ICD-10-CM | POA: Diagnosis not present

## 2020-07-25 DIAGNOSIS — E871 Hypo-osmolality and hyponatremia: Secondary | ICD-10-CM | POA: Diagnosis not present

## 2020-07-25 DIAGNOSIS — N189 Chronic kidney disease, unspecified: Secondary | ICD-10-CM | POA: Diagnosis not present

## 2020-07-25 DIAGNOSIS — I5032 Chronic diastolic (congestive) heart failure: Secondary | ICD-10-CM | POA: Diagnosis not present

## 2020-07-25 DIAGNOSIS — E1122 Type 2 diabetes mellitus with diabetic chronic kidney disease: Secondary | ICD-10-CM | POA: Diagnosis not present

## 2020-07-25 DIAGNOSIS — E875 Hyperkalemia: Secondary | ICD-10-CM | POA: Diagnosis not present

## 2020-07-25 DIAGNOSIS — D473 Essential (hemorrhagic) thrombocythemia: Secondary | ICD-10-CM | POA: Diagnosis not present

## 2020-07-25 DIAGNOSIS — E1129 Type 2 diabetes mellitus with other diabetic kidney complication: Secondary | ICD-10-CM | POA: Diagnosis not present

## 2020-07-25 DIAGNOSIS — R809 Proteinuria, unspecified: Secondary | ICD-10-CM | POA: Diagnosis not present

## 2020-08-08 DIAGNOSIS — E1122 Type 2 diabetes mellitus with diabetic chronic kidney disease: Secondary | ICD-10-CM | POA: Diagnosis not present

## 2020-08-08 DIAGNOSIS — I5032 Chronic diastolic (congestive) heart failure: Secondary | ICD-10-CM | POA: Diagnosis not present

## 2020-08-08 DIAGNOSIS — E875 Hyperkalemia: Secondary | ICD-10-CM | POA: Diagnosis not present

## 2020-08-08 DIAGNOSIS — D473 Essential (hemorrhagic) thrombocythemia: Secondary | ICD-10-CM | POA: Diagnosis not present

## 2020-08-08 DIAGNOSIS — R809 Proteinuria, unspecified: Secondary | ICD-10-CM | POA: Diagnosis not present

## 2020-08-08 DIAGNOSIS — N189 Chronic kidney disease, unspecified: Secondary | ICD-10-CM | POA: Diagnosis not present

## 2020-08-08 DIAGNOSIS — E1129 Type 2 diabetes mellitus with other diabetic kidney complication: Secondary | ICD-10-CM | POA: Diagnosis not present

## 2020-08-08 DIAGNOSIS — E871 Hypo-osmolality and hyponatremia: Secondary | ICD-10-CM | POA: Diagnosis not present

## 2020-08-13 ENCOUNTER — Other Ambulatory Visit: Payer: Self-pay

## 2020-08-13 ENCOUNTER — Ambulatory Visit (INDEPENDENT_AMBULATORY_CARE_PROVIDER_SITE_OTHER): Payer: Medicaid Other | Admitting: Orthopaedic Surgery

## 2020-08-13 ENCOUNTER — Ambulatory Visit
Admission: EM | Admit: 2020-08-13 | Discharge: 2020-08-13 | Disposition: A | Discharge status: Home / Self Care | Payer: Medicaid Other | Attending: Physician Assistant | Admitting: Physician Assistant

## 2020-08-13 ENCOUNTER — Ambulatory Visit (INDEPENDENT_AMBULATORY_CARE_PROVIDER_SITE_OTHER): Payer: Medicaid Other

## 2020-08-13 VITALS — Ht 68.0 in | Wt 296.0 lb

## 2020-08-13 DIAGNOSIS — M25552 Pain in left hip: Secondary | ICD-10-CM

## 2020-08-13 DIAGNOSIS — M160 Bilateral primary osteoarthritis of hip: Secondary | ICD-10-CM | POA: Diagnosis not present

## 2020-08-13 DIAGNOSIS — M1611 Unilateral primary osteoarthritis, right hip: Secondary | ICD-10-CM | POA: Diagnosis not present

## 2020-08-13 DIAGNOSIS — Z6841 Body Mass Index (BMI) 40.0 and over, adult: Secondary | ICD-10-CM | POA: Diagnosis not present

## 2020-08-13 DIAGNOSIS — M1612 Unilateral primary osteoarthritis, left hip: Secondary | ICD-10-CM

## 2020-08-13 DIAGNOSIS — L03012 Cellulitis of left finger: Secondary | ICD-10-CM

## 2020-08-13 MED ORDER — SULFAMETHOXAZOLE-TRIMETHOPRIM 800-160 MG PO TABS: 1.0000 | ORAL_TABLET | Freq: Two times a day (BID) | ORAL | 0 refills | Status: DC

## 2020-08-13 NOTE — Progress Notes (Signed)
Office Visit Note   Patient: [name]           Date of Birth: [birth year]           MRN: [id number] Visit Date: 08/13/2020              Requested by: Perlie Mayo, NP [address] [place],  [place] [address] PCP: Perlie Mayo, NP   Assessment & Plan: Visit Diagnoses:  1. Pain in left hip   2. Unilateral primary osteoarthritis, right hip   3. Unilateral primary osteoarthritis, left hip   4. Morbid obesity with body mass index (BMI) of 40.0 to 44.9 in adult [facility])     Plan: I did talk to him in detail about his hips.  Unfortunately is not a surgical candidate still given his BMI of 45 and I would not feel comfortable performing hip replacement surgery on him given his abdomen size and thigh size.  Also is concerned about his diabetic control with a hemoglobin A1c of 7.4.  I have recommended weight loss and continue activity modification.  He will continue to follow-up with [facility] for his injections.  Follow-Up Instructions: Return if symptoms worsen or fail to improve.   Orders:  Orders Placed This Encounter  Procedures  . XR HIP UNILAT W OR W/O PELVIS 1V LEFT   No orders of the defined types were placed in this encounter.     Procedures: No procedures performed   Clinical Data: No additional findings.   Subjective: Chief Complaint  Patient presents with  . Left Hip - Pain  The patient comes in today for evaluation treatment of bilateral hip pain with left worse than right.  He has well-documented end-stage arthritis of both his hips.  He sees physicians at [facility] who have provided intra-articular steroid injections in his hips.  The last ones he had were just a month ago.  He walks with a walking stick.  He said his highest weight he was 600 pounds.  He is down now to about 293 pounds.  His BMI is 45.  He does use a walking stick to ambulate.  He is a diabetic.  His last hemoglobin A1c about 6 months ago was 7.4.  He has a  significant difficult time mobilizing.  He has significant and severe hip pain.  It is definitely affecting his quality of life and his actives of daily living.  HPI  Review of Systems He currently denies any headache, chest pain, shortness of breath, fever, chills, nausea, vomiting  Objective: Vital Signs: Ht 5\' 8"  (1.727 m)   Wt 296 lb (134.3 kg)   BMI 45.01 kg/m   Physical Exam He is alert and orient x3 and in no acute distress but has difficult time mobilizing. Ortho Exam Examination of both hips shows significant limitations with range of motion and severe pain in the groin on both sides.  He has a large thigh bilaterally and a large abdomen which deafly makes surgery quite difficult. Specialty Comments:  No specialty comments available.  Imaging: XR HIP UNILAT W OR W/O PELVIS 1V LEFT  Result Date: 08/13/2020 An AP pelvis and lateral of the left hip shows severe end-stage arthritis of both hips with complete loss of the joint space bilaterally.  There are cystic changes in both hips as well.    PMFS History: Patient Active Problem List   Diagnosis Date Noted  . Unilateral primary osteoarthritis, right hip 08/13/2020  .  Unilateral primary osteoarthritis, left hip 08/13/2020  . Muscle spasms of both lower extremities 05/23/2020  . Need for immunization against influenza 03/06/2020  . Primary osteoarthritis of both hips 07/10/2019  . Vitamin D deficiency 04/13/2019  . Morbid obesity with body mass index (BMI) of 40.0 to 44.9 in adult [facility]) 04/13/2019  . Kidney disease, chronic, stage III (GFR 30-59 ml/min) (HCC) 07/20/2017  . Persistent proteinuria 07/20/2017  . History of cerebrovascular accident 07/20/2017  . Gastroesophageal reflux disease 07/20/2017  . Gastric bypass status for obesity 02/11/2017  . Type 2 diabetes mellitus with diabetic nephropathy ([place]) 08/03/2008  . Hyperlipidemia 08/03/2008  . SLEEP APNEA 08/03/2008   Past Medical History:  Diagnosis Date  .  ALLERGY 08/03/2008   Qualifier: Diagnosis of  By: Christ Kick    . Anemia   . Arthritis    "hips" (01/15/2015)  . Blood transfusion without reported diagnosis   . Cellulitis 07/25/2018  . Chronic back pain greater than 3 months duration 02/11/2017  . Chronic lower back pain   . Colon polyps   . COLONIC POLYPS, ADENOMATOUS 08/03/2008   Qualifier: Diagnosis of  By: Christ Kick    . CVA 08/03/2008   Qualifier: History of  By: Christ Kick    . Depression    denies  . Diabetes mellitus    diet controlled- no meds since wt loss  . Diabetes mellitus ([place]) 07/10/2019  . Dysrhythmia    s/p surgery bariatric- cardioversion  . Erectile dysfunction 01/26/2018  . GERD (gastroesophageal reflux disease)   . HIATAL HERNIA 08/03/2008   Qualifier: Diagnosis of  By: Christ Kick    . Hip pain   . History of gout   . Hyperlipidemia   . Hypertension   . Joint pain   . Narcotic dependence ([place]) 02/11/2017  . Neuromuscular disorder ([place])   . PONV (postoperative nausea and vomiting)   . Pre-diabetes   . Prediabetes 03/29/2017  . Sleep apnea    no longer have to use cpap since wt loss  . Stage 3 chronic kidney disease ([place]) 01/26/2018  . Stroke [facility]) 2006   denies residual on 01/15/2015  . 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 ([place]) 08/01/2018    Family History  Problem Relation Age of Onset  . COPD Mother   . Depression Mother   . Hyperlipidemia Mother   . Hypertension Mother   . Heart disease Mother   . Thyroid disease Mother   . Anxiety disorder Mother   . Diabetes Father   . Pulmonary embolism Father 96       blood clot  . Hyperlipidemia Father   . Sudden death Father   . Obesity Father   . Eating disorder Father   . Hypertension Brother     Past Surgical History:  Procedure Laterality Date  . BARIATRIC SURGERY  2010   in [place]  . CARDIOVERSION    . COLONOSCOPY N/A 08/02/2014   Procedure: COLONOSCOPY;  Surgeon: Rogene Houston, MD;  [facility];   Service: Endoscopy;  Laterality: N/A;  210 - moved to 3/10 @ 11:55 - Ann notified pt  . ESOPHAGOGASTRODUODENOSCOPY    . HERNIA REPAIR    . LIPOSUCTION    . PANNICULECTOMY  01/14/2015  . PANNICULECTOMY N/A 01/14/2015   Procedure:  TOTAL PANNICULECTOMY WTH LIPOSUCTION OF SIDES;  Surgeon: Cristine Polio, MD;  Location: [facility];  Service: Plastics;  Laterality: N/A;  . TONSILLECTOMY    . VENTRAL HERNIA REPAIR  01/14/2015  . VENTRAL HERNIA REPAIR N/A 01/14/2015   Procedure: HERNIA REPAIR VENTRAL ADULT AND MUSCLE REPAIR;  Surgeon: Cristine Polio, MD;  Location: [facility] [place];  Service: Plastics;  Laterality: N/A;   Social History   Occupational History  . Not on file  Tobacco Use  . Smoking status: Never Smoker  . Smokeless tobacco: Never Used  Vaping Use  . Vaping Use: Never used  Substance and Sexual Activity  . Alcohol use: No  . Drug use: No  . Sexual activity: Yes    Birth control/protection: Condom

## 2020-08-13 NOTE — Discharge Instructions (Signed)
Return if any problems.

## 2020-08-13 NOTE — ED Triage Notes (Signed)
Pt presents with left thumb pain at the base of nail. Pt said he pulled off nail last night and was red and sore this morning

## 2020-08-14 NOTE — ED Provider Notes (Signed)
[facility]    CSN: [id number] Arrival date & time: 08/13/20  1101      History   Chief Complaint No chief complaint on file.   HPI [name] is a 50 y.o. male.   Pt pulled the cuticle on his finger and now it is swollen and painful  The history is provided by the patient. No language interpreter was used.  Hand Pain This is a new problem. The current episode started yesterday. Nothing aggravates the symptoms. Nothing relieves the symptoms. He has tried nothing for the symptoms. The treatment provided no relief.    Past Medical History:  Diagnosis Date  . ALLERGY 08/03/2008   Qualifier: Diagnosis of  By: Christ Kick    . Anemia   . Arthritis    "hips" (01/15/2015)  . Blood transfusion without reported diagnosis   . Cellulitis 07/25/2018  . Chronic back pain greater than 3 months duration 02/11/2017  . Chronic lower back pain   . Colon polyps   . COLONIC POLYPS, ADENOMATOUS 08/03/2008   Qualifier: Diagnosis of  By: Christ Kick    . CVA 08/03/2008   Qualifier: History of  By: Christ Kick    . Depression    denies  . Diabetes mellitus    diet controlled- no meds since wt loss  . Diabetes mellitus ([place]) 07/10/2019  . Dysrhythmia    s/p surgery bariatric- cardioversion  . Erectile dysfunction 01/26/2018  . GERD (gastroesophageal reflux disease)   . HIATAL HERNIA 08/03/2008   Qualifier: Diagnosis of  By: Christ Kick    . Hip pain   . History of gout   . Hyperlipidemia   . Hypertension   . Joint pain   . Narcotic dependence ([place]) 02/11/2017  . Neuromuscular disorder ([place])   . PONV (postoperative nausea and vomiting)   . Pre-diabetes   . Prediabetes 03/29/2017  . Sleep apnea    no longer have to use cpap since wt loss  . Stage 3 chronic kidney disease ([place]) 01/26/2018  . Stroke [facility]) 2006   denies residual on 01/15/2015  . 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 ([place]) 08/01/2018    Patient Active Problem List   Diagnosis Date  Noted  . Unilateral primary osteoarthritis, right hip 08/13/2020  . Unilateral primary osteoarthritis, left hip 08/13/2020  . Muscle spasms of both lower extremities 05/23/2020  . Need for immunization against influenza 03/06/2020  . Primary osteoarthritis of both hips 07/10/2019  . Vitamin D deficiency 04/13/2019  . Morbid obesity with body mass index (BMI) of 40.0 to 44.9 in adult [facility]) 04/13/2019  . Kidney disease, chronic, stage III (GFR 30-59 ml/min) (HCC) 07/20/2017  . Persistent proteinuria 07/20/2017  . History of cerebrovascular accident 07/20/2017  . Gastroesophageal reflux disease 07/20/2017  . Gastric bypass status for obesity 02/11/2017  . Type 2 diabetes mellitus with diabetic nephropathy ([place]) 08/03/2008  . Hyperlipidemia 08/03/2008  . SLEEP APNEA 08/03/2008    Past Surgical History:  Procedure Laterality Date  . BARIATRIC SURGERY  2010   in [place]  . CARDIOVERSION    . COLONOSCOPY N/A 08/02/2014   Procedure: COLONOSCOPY;  Surgeon: Rogene Houston, MD;  [facility];  Service: Endoscopy;  Laterality: N/A;  210 - moved to 3/10 @ 11:55 - Ann notified pt  . ESOPHAGOGASTRODUODENOSCOPY    . HERNIA REPAIR    . LIPOSUCTION    . PANNICULECTOMY  01/14/2015  . PANNICULECTOMY N/A 01/14/2015   Procedure:  TOTAL  PANNICULECTOMY WTH LIPOSUCTION OF SIDES;  Surgeon: Cristine Polio, MD;  Location: [place];  Service: Plastics;  Laterality: N/A;  . TONSILLECTOMY    . VENTRAL HERNIA REPAIR  01/14/2015  . VENTRAL HERNIA REPAIR N/A 01/14/2015   Procedure: HERNIA REPAIR VENTRAL ADULT AND MUSCLE REPAIR;  Surgeon: Cristine Polio, MD;  Location: [place];  Service: Plastics;  Laterality: N/A;       Home Medications    Prior to Admission medications   Medication Sig Start Date End Date Taking? Authorizing Provider  sulfamethoxazole-trimethoprim (BACTRIM DS) 800-160 MG tablet Take 1 tablet by mouth 2 (two) times daily. 08/13/20  Yes Fransico Meadow, PA-C  acetaminophen (TYLENOL)  500 MG tablet Take 1,000 mg by mouth every 6 (six) hours as needed for moderate pain.    [provider]  atorvastatin (LIPITOR) 10 MG tablet Take 1 tablet (10 mg total) by mouth daily. 03/11/20   Perlie Mayo, NP  blood glucose meter kit and supplies KIT Test daily Dx  r73.03 06/26/19   [name], DO  cyclobenzaprine (FLEXERIL) 10 MG tablet Take 1 tablet (10 mg total) by mouth 2 (two) times daily as needed for muscle spasms. 05/21/20   Perlie Mayo, NP  glucose blood test strip Use as instructed 01/09/20   Perlie Mayo, NP  latanoprost (XALATAN) 0.005 % ophthalmic solution Place 1 drop into both eyes at bedtime.    [provider]  latanoprost (XALATAN) 0.005 % ophthalmic solution Apply to eye.    [provider]  lisinopril (ZESTRIL) 10 MG tablet Take 1 tablet (10 mg total) by mouth daily. 02/08/20   Perlie Mayo, NP  Multiple Vitamins-Minerals (MULTIVITAMIN WITH MINERALS) tablet Take by mouth.    [provider]  NARCAN 4 MG/0.1ML LIQD nasal spray kit ADMINISTER A SINGLE SPRAYDOF NARCAN INTO ONE NOSTRIL. REPEAT AFTER 2 TO 3 MINUTES IN OTHER NOSTRIL IF NO RESPONSE. 01/18/19   [provider]  Oxycodone HCl 20 MG TABS Take 1 tablet by mouth every 6 (six) hours as needed. 11/23/19   [provider]  sildenafil (VIAGRA) 50 MG tablet Take 1 tablet (50 mg total) by mouth daily as needed for erectile dysfunction. 03/19/20   Perlie Mayo, NP  timolol (BETIMOL) 0.5 % ophthalmic solution Apply 1 drop to eye daily.    [provider]    Family History Family History  Problem Relation Age of Onset  . COPD Mother   . Depression Mother   . Hyperlipidemia Mother   . Hypertension Mother   . Heart disease Mother   . Thyroid disease Mother   . Anxiety disorder Mother   . Diabetes Father   . Pulmonary embolism Father 40       blood clot  . Hyperlipidemia Father   . Sudden death Father   . Obesity Father   . Eating disorder  Father   . Hypertension Brother     Social History Social History   Tobacco Use  . Smoking status: Never Smoker  . Smokeless tobacco: Never Used  Vaping Use  . Vaping Use: Never used  Substance Use Topics  . Alcohol use: No  . Drug use: No     Allergies   Patient has no known allergies.   Review of Systems Review of Systems  All other systems reviewed and are negative.    Physical Exam Triage Vital Signs ED Triage Vitals [08/13/20 1147]  Enc Vitals Group     BP (!) 157/94  Pulse Rate 88     Resp 20     Temp 97.9 F (36.6 C)     Temp Source Oral     SpO2 98 %     Weight      Height      Head Circumference      Peak Flow      Pain Score      Pain Loc      Pain Edu?      Excl. in [place]?    No data found.  Updated Vital Signs BP (!) 157/94   Pulse 88   Temp 97.9 F (36.6 C) (Oral)   Resp 20   SpO2 98%   Visual Acuity Right Eye Distance:   Left Eye Distance:   Bilateral Distance:    Right Eye Near:   Left Eye Near:    Bilateral Near:     Physical Exam Musculoskeletal:        General: Normal range of motion.  Skin:    General: Skin is warm.  Neurological:     General: No focal deficit present.  Psychiatric:        Mood and Affect: Mood normal.      UC Treatments / Results  Labs (all labs ordered are listed, but only abnormal results are displayed) Labs Reviewed - No data to display  EKG   Radiology XR HIP UNILAT W OR W/O PELVIS 1V LEFT  Result Date: 08/13/2020 An AP pelvis and lateral of the left hip shows severe end-stage arthritis of both hips with complete loss of the joint space bilaterally.  There are cystic changes in both hips as well.   Procedures Procedures (including critical care time)  Medications Ordered in UC Medications - No data to display  Initial Impression / Assessment and Plan / UC Course  I have reviewed the triage vital signs and the nursing notes.  Pertinent labs & imaging results that were  available during my care of the patient were reviewed by me and considered in my medical decision making (see chart for details).    MDM  Area drained with freeze sprain and 18 gauge needle  Final Clinical Impressions(s) / UC Diagnoses   Final diagnoses:  Paronychia of finger of left hand     Discharge Instructions     Return if any problems.    ED Prescriptions    Medication Sig Dispense Auth. Provider   sulfamethoxazole-trimethoprim (BACTRIM DS) 800-160 MG tablet Take 1 tablet by mouth 2 (two) times daily. 14 tablet Fransico Meadow, [place]     PDMP not reviewed this encounter.  An After Visit Summary was printed and given to the patient.    Fransico Meadow, [place] 08/14/20 [phone]

## 2020-09-05 ENCOUNTER — Ambulatory Visit (INDEPENDENT_AMBULATORY_CARE_PROVIDER_SITE_OTHER): Payer: Medicaid Other | Admitting: Gastroenterology

## 2020-09-06 ENCOUNTER — Encounter: Payer: Self-pay | Admitting: Emergency Medicine

## 2020-09-06 ENCOUNTER — Ambulatory Visit
Admission: EM | Admit: 2020-09-06 | Discharge: 2020-09-06 | Disposition: A | Discharge status: Home / Self Care | Payer: Medicaid Other | Attending: Emergency Medicine | Admitting: Emergency Medicine

## 2020-09-06 ENCOUNTER — Other Ambulatory Visit: Payer: Self-pay

## 2020-09-06 DIAGNOSIS — L02414 Cutaneous abscess of left upper limb: Secondary | ICD-10-CM

## 2020-09-06 DIAGNOSIS — L03114 Cellulitis of left upper limb: Secondary | ICD-10-CM

## 2020-09-06 MED ORDER — DOXYCYCLINE HYCLATE 100 MG PO CAPS: 100.0000 mg | ORAL_CAPSULE | Freq: Two times a day (BID) | ORAL | 0 refills | Status: DC

## 2020-09-06 NOTE — ED Provider Notes (Signed)
[place]   [id number] 09/06/20 Arrival Time: 1600   UX:NATFTDD  SUBJECTIVE:  [name] is a 50 y.o. male who presents with a possible abscess of his LT elbow x 5 days ago. Fall and scraped elbow five days ago.  Has tried OTC neosporin with minimal relief.  Sore to the touch.  Reports similar symptoms in the past.  Denies fever, chills, nausea, vomiting.  Reports redness, swelling and drainage.    ROS: As per HPI.  All other pertinent ROS negative.     Past Medical History:  Diagnosis Date  . ALLERGY 08/03/2008   Qualifier: Diagnosis of  By: Christ Kick    . Anemia   . Arthritis    "hips" (01/15/2015)  . Blood transfusion without reported diagnosis   . Cellulitis 07/25/2018  . Chronic back pain greater than 3 months duration 02/11/2017  . Chronic lower back pain   . Colon polyps   . COLONIC POLYPS, ADENOMATOUS 08/03/2008   Qualifier: Diagnosis of  By: Christ Kick    . CVA 08/03/2008   Qualifier: History of  By: Christ Kick    . Depression    denies  . Diabetes mellitus    diet controlled- no meds since wt loss  . Diabetes mellitus ([place]) 07/10/2019  . Dysrhythmia    s/p surgery bariatric- cardioversion  . Erectile dysfunction 01/26/2018  . GERD (gastroesophageal reflux disease)   . HIATAL HERNIA 08/03/2008   Qualifier: Diagnosis of  By: Christ Kick    . Hip pain   . History of gout   . Hyperlipidemia   . Hypertension   . Joint pain   . Narcotic dependence ([place]) 02/11/2017  . Neuromuscular disorder ([place])   . PONV (postoperative nausea and vomiting)   . Pre-diabetes   . Prediabetes 03/29/2017  . Sleep apnea    no longer have to use cpap since wt loss  . Stage 3 chronic kidney disease ([place]) 01/26/2018  . Stroke [facility]) 2006   denies residual on 01/15/2015  . 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 ([place]) 08/01/2018   Past Surgical History:  Procedure Laterality Date  . BARIATRIC SURGERY  2010   in [place]  . CARDIOVERSION    .  COLONOSCOPY N/A 08/02/2014   Procedure: COLONOSCOPY;  Surgeon: Rogene Houston, MD;  [facility];  Service: Endoscopy;  Laterality: N/A;  210 - moved to 3/10 @ 11:55 - Ann notified pt  . ESOPHAGOGASTRODUODENOSCOPY    . HERNIA REPAIR    . LIPOSUCTION    . PANNICULECTOMY  01/14/2015  . PANNICULECTOMY N/A 01/14/2015   Procedure:  TOTAL PANNICULECTOMY WTH LIPOSUCTION OF SIDES;  Surgeon: Cristine Polio, MD;  Location: [place];  Service: Plastics;  Laterality: N/A;  . TONSILLECTOMY    . VENTRAL HERNIA REPAIR  01/14/2015  . VENTRAL HERNIA REPAIR N/A 01/14/2015   Procedure: HERNIA REPAIR VENTRAL ADULT AND MUSCLE REPAIR;  Surgeon: Cristine Polio, MD;  Location: [place];  Service: Plastics;  Laterality: N/A;   No Known Allergies No current facility-administered medications on file prior to encounter.   Current Outpatient Medications on File Prior to Encounter  Medication Sig Dispense Refill  . acetaminophen (TYLENOL) 500 MG tablet Take 1,000 mg by mouth every 6 (six) hours as needed for moderate pain.    Marland Kitchen atorvastatin (LIPITOR) 10 MG tablet Take 1 tablet (10 mg total) by mouth daily. 90 tablet 0  . blood glucose meter kit and supplies KIT Test daily Dx  r73.03 1 each 0  . cyclobenzaprine (FLEXERIL) 10 MG tablet Take 1 tablet (10 mg total) by mouth 2 (two) times daily as needed for muscle spasms. 60 tablet 0  . glucose blood test strip Use as instructed 100 each 2  . latanoprost (XALATAN) 0.005 % ophthalmic solution Place 1 drop into both eyes at bedtime.    Marland Kitchen latanoprost (XALATAN) 0.005 % ophthalmic solution Apply to eye.    Marland Kitchen lisinopril (ZESTRIL) 10 MG tablet Take 1 tablet (10 mg total) by mouth daily. 90 tablet 0  . Multiple Vitamins-Minerals (MULTIVITAMIN WITH MINERALS) tablet Take by mouth.    Karma Greaser 4 MG/0.1ML LIQD nasal spray kit ADMINISTER A SINGLE SPRAYDOF NARCAN INTO ONE NOSTRIL. REPEAT AFTER 2 TO 3 MINUTES IN OTHER NOSTRIL IF NO RESPONSE.    Marland Kitchen Oxycodone HCl 20 MG TABS Take 1  tablet by mouth every 6 (six) hours as needed.    . sildenafil (VIAGRA) 50 MG tablet Take 1 tablet (50 mg total) by mouth daily as needed for erectile dysfunction. 20 tablet 0  . sulfamethoxazole-trimethoprim (BACTRIM DS) 800-160 MG tablet Take 1 tablet by mouth 2 (two) times daily. 14 tablet 0  . timolol (BETIMOL) 0.5 % ophthalmic solution Apply 1 drop to eye daily.     Social History   Socioeconomic History  . Marital status: Single    Spouse name: Not on file  . Number of children: 0  . Years of education: 47  . Highest education level: Not on file  Occupational History  . Not on file  Tobacco Use  . Smoking status: Never Smoker  . Smokeless tobacco: Never Used  Vaping Use  . Vaping Use: Never used  Substance and Sexual Activity  . Alcohol use: No  . Drug use: No  . Sexual activity: Yes    Birth control/protection: Condom  Other Topics Concern  . 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      Enjoy races, eat out, watch movies, listen music, reads some      Diet: Snacks a lot, tried to avoid red meat, does eat chicken and [place], eats a lot of carbs and breads (chips), and sweets   Caffeine: drinks a can monster daily   Water: 3-4 bottles 16.9 oz      Wear seat belt   Wear sunscreen   Smoke and carbon monoxide detectors    Reports not using phone when driving   Social Determinants of Health   Financial Resource Strain: Not on file  Food Insecurity: Not on file  Transportation Needs: Not on file  Physical Activity: Not on file  Stress: Not on file  Social Connections: Not on file  Intimate Partner Violence: Not on file   Family History  Problem Relation Age of Onset  . COPD Mother   . Depression Mother   . Hyperlipidemia Mother   . Hypertension Mother   . Heart disease Mother   . Thyroid disease Mother   . Anxiety disorder Mother   . Diabetes Father   . Pulmonary embolism Father 9       blood clot  . Hyperlipidemia Father    . Sudden death Father   . Obesity Father   . Eating disorder Father   . Hypertension Brother     OBJECTIVE:  Vitals:   09/06/20 1618  BP: (!) 153/95  Pulse: 99  Resp: 19  Temp: 98.4 F (36.9 C)  TempSrc: Oral  SpO2: 93%     General appearance: alert; no distress Skin: 2-3 x 2-3 cm area of erythema and induration with pusutle formation to LT posterior elbow; tender to touch; no active drainage Psychological: alert and cooperative; normal mood and affect   ASSESSMENT & PLAN:  1. Abscess of left elbow   2. Cellulitis of left elbow     Meds ordered this encounter  Medications  . doxycycline (VIBRAMYCIN) 100 MG capsule    Sig: Take 1 capsule (100 mg total) by mouth 2 (two) times daily.    Dispense:  20 capsule    Refill:  0    Order Specific Question:   Supervising Provider    Answer:   Raylene Everts [[id number]]   Apply warm compresses 3-4x daily for 10-15 minutes Wash site daily with warm water and mild soap Keep covered to avoid friction Take antibiotic as prescribed and to completion Follow up here or with PCP if symptoms persists Return or go to the ED if you have any new or worsening symptoms increased redness, swelling, pain, nausea, vomiting, fever, chills, etc...   Reviewed expectations re: course of current medical issues. Questions answered. Outlined signs and symptoms indicating need for more acute intervention. Patient verbalized understanding. After Visit Summary given.          Lestine Box, PA-C 09/06/20 1652

## 2020-09-06 NOTE — Discharge Instructions (Signed)
Apply warm compresses 3-4x daily for 10-15 minutes Wash site daily with warm water and mild soap Keep covered to avoid friction Take antibiotic as prescribed and to completion Follow up here or with PCP if symptoms persists Return or go to the ED if you have any new or worsening symptoms increased redness, swelling, pain, nausea, vomiting, fever, chills, etc...  

## 2020-09-06 NOTE — ED Triage Notes (Signed)
Redness and swelling to LT elbow after pt fell and scraped it.

## 2020-09-12 DIAGNOSIS — M25551 Pain in right hip: Secondary | ICD-10-CM | POA: Diagnosis not present

## 2020-09-12 DIAGNOSIS — M25552 Pain in left hip: Secondary | ICD-10-CM | POA: Diagnosis not present

## 2020-10-03 DIAGNOSIS — M1612 Unilateral primary osteoarthritis, left hip: Secondary | ICD-10-CM | POA: Diagnosis not present

## 2020-10-24 DIAGNOSIS — I5032 Chronic diastolic (congestive) heart failure: Secondary | ICD-10-CM | POA: Diagnosis not present

## 2020-10-24 DIAGNOSIS — N170 Acute kidney failure with tubular necrosis: Secondary | ICD-10-CM | POA: Diagnosis not present

## 2020-10-24 DIAGNOSIS — E1129 Type 2 diabetes mellitus with other diabetic kidney complication: Secondary | ICD-10-CM | POA: Diagnosis not present

## 2020-10-24 DIAGNOSIS — E1122 Type 2 diabetes mellitus with diabetic chronic kidney disease: Secondary | ICD-10-CM | POA: Diagnosis not present

## 2020-10-24 DIAGNOSIS — N189 Chronic kidney disease, unspecified: Secondary | ICD-10-CM | POA: Diagnosis not present

## 2020-10-24 DIAGNOSIS — R809 Proteinuria, unspecified: Secondary | ICD-10-CM | POA: Diagnosis not present

## 2020-10-28 ENCOUNTER — Other Ambulatory Visit: Payer: Self-pay | Admitting: Family Medicine

## 2020-10-28 DIAGNOSIS — E785 Hyperlipidemia, unspecified: Secondary | ICD-10-CM

## 2020-11-18 DIAGNOSIS — N189 Chronic kidney disease, unspecified: Secondary | ICD-10-CM | POA: Diagnosis not present

## 2020-11-18 DIAGNOSIS — N170 Acute kidney failure with tubular necrosis: Secondary | ICD-10-CM | POA: Diagnosis not present

## 2020-11-18 DIAGNOSIS — E1122 Type 2 diabetes mellitus with diabetic chronic kidney disease: Secondary | ICD-10-CM | POA: Diagnosis not present

## 2020-11-18 DIAGNOSIS — I5032 Chronic diastolic (congestive) heart failure: Secondary | ICD-10-CM | POA: Diagnosis not present

## 2020-11-18 DIAGNOSIS — E1129 Type 2 diabetes mellitus with other diabetic kidney complication: Secondary | ICD-10-CM | POA: Diagnosis not present

## 2020-11-18 DIAGNOSIS — R809 Proteinuria, unspecified: Secondary | ICD-10-CM | POA: Diagnosis not present

## 2020-11-20 DIAGNOSIS — I5032 Chronic diastolic (congestive) heart failure: Secondary | ICD-10-CM | POA: Diagnosis not present

## 2020-11-20 DIAGNOSIS — N189 Chronic kidney disease, unspecified: Secondary | ICD-10-CM | POA: Diagnosis not present

## 2020-11-20 DIAGNOSIS — E1129 Type 2 diabetes mellitus with other diabetic kidney complication: Secondary | ICD-10-CM | POA: Diagnosis not present

## 2020-11-20 DIAGNOSIS — R809 Proteinuria, unspecified: Secondary | ICD-10-CM | POA: Diagnosis not present

## 2020-11-20 DIAGNOSIS — N170 Acute kidney failure with tubular necrosis: Secondary | ICD-10-CM | POA: Diagnosis not present

## 2020-11-20 DIAGNOSIS — E1122 Type 2 diabetes mellitus with diabetic chronic kidney disease: Secondary | ICD-10-CM | POA: Diagnosis not present

## 2020-12-16 DIAGNOSIS — M1612 Unilateral primary osteoarthritis, left hip: Secondary | ICD-10-CM | POA: Diagnosis not present

## 2020-12-30 ENCOUNTER — Encounter (INDEPENDENT_AMBULATORY_CARE_PROVIDER_SITE_OTHER): Payer: Self-pay

## 2020-12-30 ENCOUNTER — Encounter (INDEPENDENT_AMBULATORY_CARE_PROVIDER_SITE_OTHER): Payer: Self-pay | Admitting: Gastroenterology

## 2020-12-30 ENCOUNTER — Ambulatory Visit (INDEPENDENT_AMBULATORY_CARE_PROVIDER_SITE_OTHER): Payer: Medicaid Other | Admitting: Gastroenterology

## 2020-12-30 ENCOUNTER — Telehealth (INDEPENDENT_AMBULATORY_CARE_PROVIDER_SITE_OTHER): Payer: Self-pay

## 2020-12-30 ENCOUNTER — Other Ambulatory Visit (INDEPENDENT_AMBULATORY_CARE_PROVIDER_SITE_OTHER): Payer: Self-pay

## 2020-12-30 ENCOUNTER — Other Ambulatory Visit: Payer: Self-pay

## 2020-12-30 DIAGNOSIS — Z860100 Personal history of colon polyps, unspecified: Secondary | ICD-10-CM | POA: Insufficient documentation

## 2020-12-30 DIAGNOSIS — D509 Iron deficiency anemia, unspecified: Secondary | ICD-10-CM | POA: Diagnosis not present

## 2020-12-30 MED ORDER — NA SULFATE-K SULFATE-MG SULF 17.5-3.13-1.6 GM/177ML PO SOLN: 1.0000 | ORAL | 0 refills | Status: DC

## 2020-12-30 NOTE — Patient Instructions (Addendum)
Schedule EGD and colonoscopy Continue oral iron every other day

## 2020-12-30 NOTE — Telephone Encounter (Signed)
[name], CMA  

## 2020-12-30 NOTE — Progress Notes (Signed)
[name], M.D. Gastroenterology & Hepatology [facility] [address] [place], [place] [address] Primary Care Physician: [name], NP [address]  [place] [address] [place] [address]  Referring MD: Ulice Bold MD and Cherly Beach, NP  Chief Complaint:  iron deficiency anemia  History of Present Illness: MAYCO WALROND is a 50 y.o. male with past medical history of CKD, chronic anemia, diabetes, depression, chronic narcotic use, stroke, hypertension, hyperlipidemia, history of gout, obesity status post RYGB in 2010, who presents for evaluation of iron deficiency anemia.  Patient was referred to our clinic for evaluation of chronic history of anemia. He has been taking iron oral pill every other year for at least 3-4 years. He received IV iron around 07/2019, received only 2 doses. Most recent blood results from 11/18/2020 showed a creatinine of 1.67, BUN of 20, normal electrolytes, had CBC performed on 08/08/2020 that showed a hemoglobin of 13.6, platelets of 240 and white blood cell count of 8.4.  Notably his iron stores on 02/07/2020 showed decreased iron of 15 and iron saturation of 6% with a ferritin of 339 but repeat testing on 05/06/2020 showed an iron saturation of 55%, total iron 176.  The patient denies having any symptoms including nausea, vomiting, fever, chills, hematochezia, melena, hematemesis, abdominal distention, abdominal pain, diarrhea/constipation, jaundice, pruritus. He has been trying to lose some weight by making some changes in his diet.  Takes oxycodone 20 mg 2-3 times per day for hip pain.  Last XQJ:JHERD to RYGB, normal per patient but no report avaialble. Last Colonoscopy:2016  Examination performed to cecum. Single diverticulum at splenic flexure. 5 mm cecal polyp was cold snared. 5 mm polyp at mid sigmoid colon was also cold snared. These polyps were submitted separately. Suboptimal prep  limiting quality of this examination. All polyps were tubular adenomas.  Patient has a history of at least 9 polyps before age 22.  FHx: neg for any gastrointestinal/liver disease,mother bladder cancer Social: neg smoking, alcohol or illicit drug use Surgical:abdominal skin removal and umbilical hernia  Past Medical History: Past Medical History:  Diagnosis Date   ALLERGY 08/03/2008   Qualifier: Diagnosis of  By: Christ Kick     Anemia    Arthritis    "hips" (01/15/2015)   Blood transfusion without reported diagnosis    Cellulitis 07/25/2018   Chronic back pain greater than 3 months duration 02/11/2017   Chronic lower back pain    Colon polyps    COLONIC POLYPS, ADENOMATOUS 08/03/2008   Qualifier: Diagnosis of  By: Christ Kick     CVA 08/03/2008   Qualifier: History of  By: Christ Kick     Depression    denies   Diabetes mellitus    diet controlled- no meds since wt loss   Diabetes mellitus ([place]) 07/10/2019   Dysrhythmia    s/p surgery bariatric- cardioversion   Erectile dysfunction 01/26/2018   GERD (gastroesophageal reflux disease)    HIATAL HERNIA 08/03/2008   Qualifier: Diagnosis of  By: Christ Kick     Hip pain    History of gout    Hyperlipidemia    Hypertension    Joint pain    Narcotic dependence ([employer]) 02/11/2017   Neuromuscular disorder ([place])    PONV (postoperative nausea and vomiting)    Pre-diabetes    Prediabetes 03/29/2017   Sleep apnea    no longer have to use cpap since wt loss   Stage 3 chronic kidney  disease ([place]) 01/26/2018   Stroke ([place]) 2006   denies residual on 01/15/2015   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 ([place]) 08/01/2018    Past Surgical History: Past Surgical History:  Procedure Laterality Date   BARIATRIC SURGERY  2010   in [place] N/A 08/02/2014   Procedure: COLONOSCOPY;  Surgeon: Rogene Houston, MD;  [facility];  Service: Endoscopy;  Laterality: N/A;  210 - moved  to 3/10 @ 11:55 - Ann notified pt   ESOPHAGOGASTRODUODENOSCOPY     HERNIA REPAIR     LIPOSUCTION     PANNICULECTOMY  01/14/2015   PANNICULECTOMY N/A 01/14/2015   Procedure:  TOTAL PANNICULECTOMY WTH LIPOSUCTION OF SIDES;  Surgeon: Cristine Polio, MD;  Location: [place];  Service: Plastics;  Laterality: N/A;   TONSILLECTOMY     VENTRAL HERNIA REPAIR  01/14/2015   VENTRAL HERNIA REPAIR N/A 01/14/2015   Procedure: HERNIA REPAIR VENTRAL ADULT AND MUSCLE REPAIR;  Surgeon: Cristine Polio, MD;  Location: [place];  Service: Plastics;  Laterality: N/A;    Family History: Family History  Problem Relation Age of Onset   COPD Mother    Depression Mother    Hyperlipidemia Mother    Hypertension Mother    Heart disease Mother    Thyroid disease Mother    Anxiety disorder Mother    Diabetes Father    Pulmonary embolism Father 74       blood clot   Hyperlipidemia Father    Sudden death Father    Obesity Father    Eating disorder Father    Hypertension Brother     Social History: Social History   Tobacco Use  Smoking Status Never  Smokeless Tobacco Never   Social History   Substance and Sexual Activity  Alcohol Use No   Social History   Substance and Sexual Activity  Drug Use No    Allergies: No Known Allergies  Medications: Current Outpatient Medications  Medication Sig Dispense Refill   acetaminophen (TYLENOL) 500 MG tablet Take 1,000 mg by mouth every 6 (six) hours as needed for moderate pain.     atorvastatin (LIPITOR) 10 MG tablet TAKE ONE TABLET BY MOUTH ONCE DAILY. 90 tablet 0   blood glucose meter kit and supplies KIT Test daily Dx  r73.03 1 each 0   cyclobenzaprine (FLEXERIL) 10 MG tablet Take 1 tablet (10 mg total) by mouth 2 (two) times daily as needed for muscle spasms. 60 tablet 0   glucose blood test strip Use as instructed 100 each 2   latanoprost (XALATAN) 0.005 % ophthalmic solution Place 1 drop into both eyes at bedtime.     latanoprost (XALATAN) 0.005 %  ophthalmic solution Apply to eye.     Multiple Vitamins-Minerals (MULTIVITAMIN WITH MINERALS) tablet Take by mouth.     NARCAN 4 MG/0.1ML LIQD nasal spray kit ADMINISTER A SINGLE SPRAYDOF NARCAN INTO ONE NOSTRIL. REPEAT AFTER 2 TO 3 MINUTES IN OTHER NOSTRIL IF NO RESPONSE.     Oxycodone HCl 20 MG TABS Take 1 tablet by mouth every 6 (six) hours as needed.     sildenafil (VIAGRA) 50 MG tablet Take 1 tablet (50 mg total) by mouth daily as needed for erectile dysfunction. 20 tablet 0   timolol (BETIMOL) 0.5 % ophthalmic solution Apply 1 drop to eye daily.     lisinopril (ZESTRIL) 10 MG tablet Take 1 tablet (10 mg total) by mouth daily. (Patient not taking:  Reported on 12/30/2020) 90 tablet 0   No current facility-administered medications for this visit.    Review of Systems: GENERAL: negative for malaise, night sweats HEENT: No changes in hearing or vision, no nose bleeds or other nasal problems. NECK: Negative for lumps, goiter, pain and significant neck swelling RESPIRATORY: Negative for cough, wheezing CARDIOVASCULAR: Negative for chest pain, leg swelling, palpitations, orthopnea GI: SEE HPI MUSCULOSKELETAL: Negative for joint pain or swelling, back pain, and muscle pain. SKIN: Negative for lesions, rash PSYCH: Negative for sleep disturbance, mood disorder and recent psychosocial stressors. HEMATOLOGY Negative for prolonged bleeding, bruising easily, and swollen nodes. ENDOCRINE: Negative for cold or heat intolerance, polyuria, polydipsia and goiter. NEURO: negative for tremor, gait imbalance, syncope and seizures. The remainder of the review of systems is noncontributory.   Physical Exam: BP (!) 156/102 (BP Location: Left Arm, Patient Position: Sitting, Cuff Size: Large)   Pulse 93   Temp 98.3 F (36.8 C) (Oral)   Ht '5\' 8"'  (1.727 m)   Wt 284 lb (128.8 kg)   BMI 43.18 kg/m  GENERAL: The patient is AO x3, in no acute distress. HEENT: Head is normocephalic and atraumatic. EOMI are  intact. Mouth is well hydrated and without lesions. NECK: Supple. No masses LUNGS: Clear to auscultation. No presence of rhonchi/wheezing/rales. Adequate chest expansion HEART: RRR, normal s1 and s2. ABDOMEN: Soft, nontender, no guarding, no peritoneal signs, and nondistended. BS +. No masses. EXTREMITIES: Without any cyanosis, clubbing, rash, lesions or edema. NEUROLOGIC: AOx3, no focal motor deficit. SKIN: no jaundice, no rashes   Imaging/Labs: as above  I personally reviewed and interpreted the available labs, imaging and endoscopic files.  Impression and Plan: CURTIS CAIN is a 50 y.o. male with past medical history of CKD, chronic anemia, diabetes, depression, chronic narcotic use, stroke, hypertension, hyperlipidemia, history of gout, obesity status post RYGB in 2010, who presents for evaluation of iron deficiency anemia. The patient denies having any symptoms at the moment but had evidence of previous iron deficiency in the past which has responded to oral iron supplementation every other day.  He has not required any blood transfusions.  I consider that part of his anemia is related to his CKD but we will explore any potential causes of iron losses with an EGD and a colonoscopy.  I do not consider that he will need to have a capsule endoscopy at this moment as he has responded to oral iron supplementation but we will need to reconsider this if he has significant drop in his blood count despite oral iron intake.  - Schedule EGD and colonoscopy - Continue oral iron every other day  All questions were answered.      [name] Peppers, MD Gastroenterology and Hepatology [facility]

## 2021-01-01 ENCOUNTER — Encounter: Payer: Medicaid Other | Admitting: Family Medicine

## 2021-01-01 ENCOUNTER — Ambulatory Visit (INDEPENDENT_AMBULATORY_CARE_PROVIDER_SITE_OTHER): Payer: Medicaid Other | Admitting: Nurse Practitioner

## 2021-01-01 ENCOUNTER — Other Ambulatory Visit: Payer: Self-pay

## 2021-01-01 ENCOUNTER — Encounter: Payer: Self-pay | Admitting: Nurse Practitioner

## 2021-01-01 VITALS — BP 156/91 | HR 91 | Temp 97.0°F | Ht 68.0 in | Wt 284.0 lb

## 2021-01-01 DIAGNOSIS — E785 Hyperlipidemia, unspecified: Secondary | ICD-10-CM

## 2021-01-01 DIAGNOSIS — Z0001 Encounter for general adult medical examination with abnormal findings: Secondary | ICD-10-CM

## 2021-01-01 DIAGNOSIS — E1121 Type 2 diabetes mellitus with diabetic nephropathy: Secondary | ICD-10-CM | POA: Diagnosis not present

## 2021-01-01 DIAGNOSIS — N1832 Chronic kidney disease, stage 3b: Secondary | ICD-10-CM | POA: Diagnosis not present

## 2021-01-01 DIAGNOSIS — M160 Bilateral primary osteoarthritis of hip: Secondary | ICD-10-CM | POA: Diagnosis not present

## 2021-01-01 NOTE — Assessment & Plan Note (Signed)
-  checking A1c -he states lisinopril was stopped by nephrology -on statin therapy -he states home blood sugar is usually around 160 -No metformin d/t renal fcn

## 2021-01-01 NOTE — Assessment & Plan Note (Signed)
-  has DM and hx of CVA -goal LDL <55 -checking lipids

## 2021-01-01 NOTE — Progress Notes (Signed)
 Established Patient Office Visit  Subjective:  Patient ID: Chasen E Onley, male    DOB: 09/09/1970  Age: 50 y.o. MRN: [id number]  CC:  Chief Complaint  Patient presents with   Annual Exam    CPE    HPI [name] presents for physical exam. No recent labs.  No acute concerns today.   Past Medical History:  Diagnosis Date   ALLERGY 08/03/2008   Qualifier: Diagnosis of  By: Stallings, Angela     Anemia    Arthritis    "hips" (01/15/2015)   Blood transfusion without reported diagnosis    Cellulitis 07/25/2018   Chronic back pain greater than 3 months duration 02/11/2017   Chronic lower back pain    Colon polyps    COLONIC POLYPS, ADENOMATOUS 08/03/2008   Qualifier: Diagnosis of  By: Stallings, Angela     CVA 08/03/2008   Qualifier: History of  By: Stallings, Angela     Depression    denies   Diabetes mellitus    diet controlled- no meds since wt loss   Diabetes mellitus (HCC) 07/10/2019   Dysrhythmia    s/p surgery bariatric- cardioversion   Erectile dysfunction 01/26/2018   GERD (gastroesophageal reflux disease)    HIATAL HERNIA 08/03/2008   Qualifier: Diagnosis of  By: Stallings, Angela     Hip pain    History of gout    Hyperlipidemia    Hypertension    Joint pain    Narcotic dependence (HCC) 02/11/2017   Neuromuscular disorder (HCC)    PONV (postoperative nausea and vomiting)    Pre-diabetes    Prediabetes 03/29/2017   Sleep apnea    no longer have to use cpap since wt loss   Stage 3 chronic kidney disease (HCC) 01/26/2018   Stroke (HCC) 2006   denies residual on 01/15/2015   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 ([facility]) 08/01/2018    Past Surgical History:  Procedure Laterality Date   BARIATRIC SURGERY  2010   in [place]   CARDIOVERSION     COLONOSCOPY N/A 08/02/2014   Procedure: COLONOSCOPY;  Surgeon: Najeeb U Rehman, MD;  [facility];  Service: Endoscopy;  Laterality: N/A;  210 - moved to 3/10 @ 11:55 - Ann notified pt    ESOPHAGOGASTRODUODENOSCOPY     HERNIA REPAIR     LIPOSUCTION     PANNICULECTOMY  01/14/2015   PANNICULECTOMY N/A 01/14/2015   Procedure:  TOTAL PANNICULECTOMY WTH LIPOSUCTION OF SIDES;  Surgeon: Gerald Truesdale, MD;  [facility];  Service: Plastics;  Laterality: N/A;   TONSILLECTOMY     VENTRAL HERNIA REPAIR  01/14/2015   VENTRAL HERNIA REPAIR N/A 01/14/2015   Procedure: HERNIA REPAIR VENTRAL ADULT AND MUSCLE REPAIR;  Surgeon: Gerald Truesdale, MD;  Location[facility];  Service: Plastics;  Laterality: N/A;    Family History  Problem Relation Age of Onset   COPD Mother    Depression Mother    Hyperlipidemia Mother    Hypertension Mother    Heart disease Mother    Thyroid disease Mother    Anxiety disorder Mother    Diabetes Father    Pulmonary embolism Father 56       blood clot   Hyperlipidemia Father    Sudden death Father    Obesity Father    Eating disorder Father    Hypertension Brother     Social History   Socioeconomic History   Marital status: Single    Spouse   name: Not on file   Number of children: 0   Years of education: 12   Highest education level: Not on file  Occupational History   Not on file  Tobacco Use   Smoking status: Never   Smokeless tobacco: Never  Vaping Use   Vaping Use: Never used  Substance and Sexual Activity   Alcohol use: No   Drug use: No   Sexual activity: Yes    Birth control/protection: Condom  Other Topics Concern   Not on file  Social History Narrative   Lives alone   Cat-Mr Kitty      Enjoy races, eat out, watch movies, listen music, reads some      Diet: Snacks a lot, tried to avoid red meat, does eat chicken and turkey, eats a lot of carbs and breads (chips), and sweets   Caffeine: drinks a can monster daily   Water: 3-4 bottles 16.9 oz      Wear seat belt   Wear sunscreen   Smoke and carbon monoxide detectors    Reports not using phone when driving   Social Determinants of Health   Financial Resource Strain:  Not on file  Food Insecurity: Not on file  Transportation Needs: Not on file  Physical Activity: Not on file  Stress: Not on file  Social Connections: Not on file  Intimate Partner Violence: Not on file    Outpatient Medications Prior to Visit  Medication Sig Dispense Refill   acetaminophen (TYLENOL) 500 MG tablet Take 1,000 mg by mouth every 6 (six) hours as needed for moderate pain.     atorvastatin (LIPITOR) 10 MG tablet TAKE ONE TABLET BY MOUTH ONCE DAILY. 90 tablet 0   blood glucose meter kit and supplies KIT Test daily Dx  r73.03 1 each 0   cyclobenzaprine (FLEXERIL) 10 MG tablet Take 1 tablet (10 mg total) by mouth 2 (two) times daily as needed for muscle spasms. 60 tablet 0   glucose blood test strip Use as instructed 100 each 2   latanoprost (XALATAN) 0.005 % ophthalmic solution Place 1 drop into both eyes at bedtime.     latanoprost (XALATAN) 0.005 % ophthalmic solution Apply to eye.     Na Sulfate-K Sulfate-Mg Sulf (SUPREP BOWEL PREP KIT) 17.5-3.13-1.6 GM/177ML SOLN Take 1 kit by mouth as directed. 354 mL 0   NARCAN 4 MG/0.1ML LIQD nasal spray kit ADMINISTER A SINGLE SPRAYDOF NARCAN INTO ONE NOSTRIL. REPEAT AFTER 2 TO 3 MINUTES IN OTHER NOSTRIL IF NO RESPONSE.     Oxycodone HCl 20 MG TABS Take 1 tablet by mouth every 6 (six) hours as needed.     sildenafil (VIAGRA) 50 MG tablet Take 1 tablet (50 mg total) by mouth daily as needed for erectile dysfunction. 20 tablet 0   timolol (BETIMOL) 0.5 % ophthalmic solution Apply 1 drop to eye daily.     lisinopril (ZESTRIL) 10 MG tablet Take 1 tablet (10 mg total) by mouth daily. 90 tablet 0   No facility-administered medications prior to visit.    No Known Allergies  ROS Review of Systems  Constitutional: Negative.   HENT: Negative.    Eyes: Negative.   Respiratory: Negative.    Cardiovascular: Negative.   Gastrointestinal: Negative.   Endocrine: Negative.   Genitourinary: Negative.   Musculoskeletal:  Positive for  arthralgias.       Bilateral hip pain  Skin: Negative.   Allergic/Immunologic: Negative.   Neurological: Negative.   Hematological: Negative.   Psychiatric/Behavioral: Negative.         Objective:    Physical Exam Constitutional:      Appearance: Normal appearance. He is obese.  HENT:     Head: Normocephalic and atraumatic.     Right Ear: Tympanic membrane, ear canal and external ear normal.     Left Ear: Tympanic membrane, ear canal and external ear normal.     Nose: Nose normal.     Mouth/Throat:     Mouth: Mucous membranes are moist.     Pharynx: Oropharynx is clear.  Eyes:     Extraocular Movements: Extraocular movements intact.     Conjunctiva/sclera: Conjunctivae normal.     Pupils: Pupils are equal, round, and reactive to light.  Cardiovascular:     Rate and Rhythm: Normal rate and regular rhythm.     Pulses: Normal pulses.          Dorsalis pedis pulses are 2+ on the right side and 2+ on the left side.     Heart sounds: Normal heart sounds.  Pulmonary:     Effort: Pulmonary effort is normal.     Breath sounds: Normal breath sounds.  Abdominal:     General: Abdomen is flat. Bowel sounds are normal.     Palpations: Abdomen is soft.  Musculoskeletal:        General: No swelling, tenderness, deformity or signs of injury.     Cervical back: Normal range of motion and neck supple.     Comments: Uses walking stick for ambulation; refuses to get on exam table d/t hip pain  Feet:     Right foot:     Protective Sensation: 10 sites tested.  10 sites sensed.     Skin integrity: Skin integrity normal.     Toenail Condition: Right toenails are normal.     Left foot:     Protective Sensation: 10 sites tested.  10 sites sensed.     Skin integrity: Skin integrity normal.     Toenail Condition: Left toenails are normal.  Skin:    General: Skin is warm and dry.     Capillary Refill: Capillary refill takes less than 2 seconds.  Neurological:     General: No focal deficit  present.     Mental Status: He is alert and oriented to person, place, and time.  Psychiatric:        Mood and Affect: Mood normal.        Behavior: Behavior normal.        Thought Content: Thought content normal.        Judgment: Judgment normal.    BP (!) 156/91 (BP Location: Right Arm, Patient Position: Sitting, Cuff Size: Large)   Pulse 91   Temp (!) 97 F (36.1 C) (Temporal)   Ht 5' 8" (1.727 m)   Wt 284 lb (128.8 kg)   SpO2 94%   BMI 43.18 kg/m  Wt Readings from Last 3 Encounters:  01/01/21 284 lb (128.8 kg)  12/30/20 284 lb (128.8 kg)  08/13/20 296 lb (134.3 kg)     Health Maintenance Due  Topic Date Due   Pneumococcal Vaccine 60-93 Years old (2 - PCV) 02/22/2010   HEMOGLOBIN A1C  05/31/2020   INFLUENZA VACCINE  12/23/2020    There are no preventive care reminders to display for this patient.  Lab Results  Component Value Date   TSH 1.81 04/13/2019   Lab Results  Component Value Date   WBC 9.5 12/20/2019   HGB 14.4 12/20/2019   HCT 42.5 12/20/2019  MCV 90.8 12/20/2019   PLT 191 12/20/2019   Lab Results  Component Value Date   NA 137 01/10/2020   K 4.4 01/10/2020   CO2 25 01/10/2020   GLUCOSE 130 01/10/2020   BUN 38 (H) 01/10/2020   CREATININE 2.05 (H) 01/10/2020   BILITOT 0.8 01/10/2020   ALKPHOS 142 (H) 10/24/2019   AST 20 01/10/2020   ALT 20 01/10/2020   PROT 6.6 01/10/2020   ALBUMIN 3.9 (L) 10/24/2019   CALCIUM 9.4 01/10/2020   ANIONGAP 10 07/25/2018   Lab Results  Component Value Date   CHOL 206 (H) 12/20/2019   Lab Results  Component Value Date   HDL 34 (L) 12/20/2019   Lab Results  Component Value Date   LDLCALC 121 (H) 12/20/2019   Lab Results  Component Value Date   TRIG 354 (H) 12/20/2019   Lab Results  Component Value Date   CHOLHDL 6.1 (H) 12/20/2019   Lab Results  Component Value Date   HGBA1C 7.4 (A) 11/29/2019   HGBA1C 7.4 11/29/2019   HGBA1C 7.4 (A) 11/29/2019   HGBA1C 7.4 (A) 11/29/2019      Assessment  & Plan:   Problem List Items Addressed This Visit       Endocrine   Type 2 diabetes mellitus with diabetic nephropathy (HCC)    -checking A1c -he states lisinopril was stopped by nephrology -on statin therapy -he states home blood sugar is usually around 160 -No metformin d/t renal fcn        Relevant Orders   Lipid Panel With LDL/HDL Ratio   Hemoglobin A1c     Musculoskeletal and Integument   Primary osteoarthritis of both hips    -followed by [facility] in [place]; has been losing weight -sees [facility] for ortho; states he can get surgery if BMI < 44         Genitourinary   Kidney disease, chronic, stage III (GFR 30-59 ml/min) (HCC)    -followed by Dr. Theador Hawthorne       Relevant Orders   CMP14+EGFR   CBC with Differential/Platelet     Other   Hyperlipidemia    -has DM and hx of CVA -goal LDL <55 -checking lipids       Relevant Orders   Lipid Panel With LDL/HDL Ratio   Other Visit Diagnoses     Encounter for general adult medical examination with abnormal findings    -  Primary   Relevant Orders   CMP14+EGFR   Lipid Panel With LDL/HDL Ratio   Hemoglobin A1c   CBC with Differential/Platelet       No orders of the defined types were placed in this encounter.   Follow-up: Return in about 1 year (around 01/01/2022) for Physical Exam.    [name], NP

## 2021-01-01 NOTE — Assessment & Plan Note (Signed)
-  followed by [facility] in [place]; has been losing weight -sees [facility] for ortho; states he can get surgery if BMI < 44

## 2021-01-01 NOTE — Assessment & Plan Note (Signed)
-  followed by Dr. Theador Hawthorne

## 2021-01-01 NOTE — Patient Instructions (Signed)
Please have fasting labs drawn tomorrow.  We will meet up for a physical in a year.  If your A1c is above 8, please set up a lab follow-up appointment in 3 months so we can treat the diabetes.

## 2021-01-02 DIAGNOSIS — E785 Hyperlipidemia, unspecified: Secondary | ICD-10-CM | POA: Diagnosis not present

## 2021-01-02 DIAGNOSIS — E1121 Type 2 diabetes mellitus with diabetic nephropathy: Secondary | ICD-10-CM | POA: Diagnosis not present

## 2021-01-02 DIAGNOSIS — Z0001 Encounter for general adult medical examination with abnormal findings: Secondary | ICD-10-CM | POA: Diagnosis not present

## 2021-01-02 DIAGNOSIS — N1832 Chronic kidney disease, stage 3b: Secondary | ICD-10-CM | POA: Diagnosis not present

## 2021-01-03 ENCOUNTER — Other Ambulatory Visit: Payer: Self-pay | Admitting: Nurse Practitioner

## 2021-01-03 ENCOUNTER — Telehealth: Payer: Self-pay

## 2021-01-03 DIAGNOSIS — E1121 Type 2 diabetes mellitus with diabetic nephropathy: Secondary | ICD-10-CM

## 2021-01-03 LAB — CMP14+EGFR
ALT: 18 IU/L (ref 0–44)
AST: 23 IU/L (ref 0–40)
Albumin/Globulin Ratio: 1.6 (ref 1.2–2.2)
Albumin: 4.2 g/dL (ref 3.8–4.9)
Alkaline Phosphatase: 104 IU/L (ref 44–121)
BUN/Creatinine Ratio: 12 (ref 9–20)
BUN: 18 mg/dL (ref 6–24)
Bilirubin Total: 1.1 mg/dL (ref 0.0–1.2)
CO2: 24 mmol/L (ref 20–29)
Calcium: 10 mg/dL (ref 8.7–10.2)
Chloride: 93 mmol/L — ABNORMAL LOW (ref 96–106)
Creatinine, Ser: 1.52 mg/dL — ABNORMAL HIGH (ref 0.76–1.27)
Globulin, Total: 2.7 g/dL (ref 1.5–4.5)
Glucose: 165 mg/dL — ABNORMAL HIGH (ref 65–99)
Potassium: 4.6 mmol/L (ref 3.5–5.2)
Sodium: 135 mmol/L (ref 134–144)
Total Protein: 6.9 g/dL (ref 6.0–8.5)
eGFR: 55 mL/min/{1.73_m2} — ABNORMAL LOW (ref >59)

## 2021-01-03 LAB — LIPID PANEL WITH LDL/HDL RATIO
Cholesterol, Total: 174 mg/dL (ref 100–199)
HDL: 37 mg/dL — ABNORMAL LOW (ref >39)
LDL Chol Calc (NIH): 91 mg/dL (ref 0–99)
LDL/HDL Ratio: 2.5 ratio (ref 0.0–3.6)
Triglycerides: 276 mg/dL — ABNORMAL HIGH (ref 0–149)
VLDL Cholesterol Cal: 46 mg/dL — ABNORMAL HIGH (ref 5–40)

## 2021-01-03 LAB — CBC WITH DIFFERENTIAL/PLATELET
Basophils Absolute: 0.1 10*3/uL (ref 0.0–0.2)
Basos: 1 %
EOS (ABSOLUTE): 0.1 10*3/uL (ref 0.0–0.4)
Eos: 2 %
Hematocrit: 47.7 % (ref 37.5–51.0)
Hemoglobin: 15.9 g/dL (ref 13.0–17.7)
Immature Grans (Abs): 0 10*3/uL (ref 0.0–0.1)
Immature Granulocytes: 0 %
Lymphocytes Absolute: 2.5 10*3/uL (ref 0.7–3.1)
Lymphs: 26 %
MCH: 30.6 pg (ref 26.6–33.0)
MCHC: 33.3 g/dL (ref 31.5–35.7)
MCV: 92 fL (ref 79–97)
Monocytes Absolute: 0.8 10*3/uL (ref 0.1–0.9)
Monocytes: 9 %
Neutrophils Absolute: 5.9 10*3/uL (ref 1.4–7.0)
Neutrophils: 62 %
Platelets: 207 10*3/uL (ref 150–450)
RBC: 5.19 x10E6/uL (ref 4.14–5.80)
RDW: 12.4 % (ref 11.6–15.4)
WBC: 9.4 10*3/uL (ref 3.4–10.8)

## 2021-01-03 LAB — HEMOGLOBIN A1C
Est. average glucose Bld gHb Est-mCnc: 180 mg/dL
Hgb A1c MFr Bld: 7.9 % — ABNORMAL HIGH (ref 4.8–5.6)

## 2021-01-03 MED ORDER — TIRZEPATIDE 2.5 MG/0.5ML ~~LOC~~ SOAJ: 2.5000 mg | SUBCUTANEOUS | 0 refills | Status: DC

## 2021-01-03 MED ORDER — TIRZEPATIDE 5 MG/0.5ML ~~LOC~~ SOAJ: 5.0000 mg | SUBCUTANEOUS | 0 refills | Status: DC

## 2021-01-03 NOTE — Progress Notes (Signed)
A1c is 7.9, and kidney function improved since last year. Let's treat the diabetes aggressively to preserve your kidney function and try to keep you off of dialysis.  Please set him up for med check in 1 month

## 2021-01-03 NOTE — Telephone Encounter (Signed)
On hold with [place] for 5 mins. Will try calling again later.

## 2021-01-03 NOTE — Telephone Encounter (Signed)
Erlene Quan from [place] called needing clarification on medication for this patient ph# [phone]

## 2021-01-06 ENCOUNTER — Other Ambulatory Visit: Payer: Self-pay | Admitting: Nurse Practitioner

## 2021-01-06 MED ORDER — SEMAGLUTIDE (1 MG/DOSE) 4 MG/3ML ~~LOC~~ SOPN: 1.0000 mg | PEN_INJECTOR | SUBCUTANEOUS | 2 refills | Status: DC

## 2021-01-06 MED ORDER — OZEMPIC (0.25 OR 0.5 MG/DOSE) 2 MG/1.5ML ~~LOC~~ SOPN: 0.5000 mg | PEN_INJECTOR | SUBCUTANEOUS | 0 refills | Status: DC

## 2021-01-06 NOTE — Progress Notes (Signed)
I sent in [place]. We will see if that gets approved.

## 2021-01-06 NOTE — Telephone Encounter (Signed)
Insurance does not want to cover the injectable you sent in. Will do PA to see if they will approve or deny.

## 2021-01-07 ENCOUNTER — Other Ambulatory Visit: Payer: Self-pay

## 2021-01-07 ENCOUNTER — Other Ambulatory Visit: Payer: Self-pay | Admitting: *Deleted

## 2021-01-07 ENCOUNTER — Telehealth: Payer: Self-pay

## 2021-01-07 DIAGNOSIS — M25551 Pain in right hip: Secondary | ICD-10-CM | POA: Diagnosis not present

## 2021-01-07 DIAGNOSIS — Z5181 Encounter for therapeutic drug level monitoring: Secondary | ICD-10-CM | POA: Diagnosis not present

## 2021-01-07 DIAGNOSIS — M25552 Pain in left hip: Secondary | ICD-10-CM | POA: Diagnosis not present

## 2021-01-07 DIAGNOSIS — Z79899 Other long term (current) drug therapy: Secondary | ICD-10-CM | POA: Diagnosis not present

## 2021-01-07 NOTE — Patient Instructions (Signed)
Visit Information  Mr. Okane was given information about Medicaid Managed Care team care coordination services as a part of their Healthy [facility] Medicaid benefit. [name] verbally consented to engagement with the [facility] Managed Care team.   If you are experiencing a medical emergency, please call 911 or report to your local emergency department or urgent care.   If you have a non-emergency medical problem during routine business hours, please contact your provider's office and ask to speak with a nurse.   For questions related to your Healthy [facility] health plan, please call: [phone] or visit the homepage here: [email or web address]  If you would like to schedule transportation through your Healthy [facility] plan, please call the following number at least 2 days in advance of your appointment: [phone]  Call the [place] at [phone], at any time, 24 hours a day, 7 days a week. If you are in danger or need immediate medical attention call 911.  If you would like help to quit smoking, call 1-800-QUIT-NOW 3108271036) OR Espaol: 1-855-Djelo-Ya (5-809-983-3825) o para ms informacin haga clic aqu or Text READY to 200-400 to register via text  Mr. Tibbs - following are the goals we discussed in your visit today:   Goals Addressed             This Visit's Progress    Cope with Chronic Pain       Timeframe:  Long-Range Goal Priority:  Medium Start Date:  01/07/21                           Expected End Date:      ongoing                 Follow Up Date 02/03/21    - use distraction techniques -take pain medicine when pain level is 6 or less - review article received in mail entitled " Chronic Pain"  -continue to lose weight in order for qualify for bilateral hip surgery    Why is this important?   Stress makes chronic pain feel worse.  Feelings like depression, anxiety, stress and anger can  make your body more sensitive to pain.  Learning ways to cope with stress or depression may help you find some relief from the pain.     Notes:      Make and Keep All Appointments       Timeframe:  Long-Range Goal Priority:  High Start Date:        01/07/21                     Expected End Date:   ongoing                    Follow Up Date 02/03/21    - arrange a ride through an agency 1 week before appointment - ask family or friend for a ride - call to cancel if needed - keep a calendar with appointment dates in the [place] [facility] spiral bound calendar received in mail   Why is this important?   Part of staying healthy is seeing the doctor for follow-up care.  If you forget your appointments, there are some things you can do to stay on track.    Notes:      Monitor and Manage My Blood Sugar-Diabetes Type 2       Timeframe:  Long-Range Goal Priority:  High Start Date:      01/07/21                       Expected End Date:      ongoing                 Follow Up Date 02/03/21    - check blood sugar daily prior to breakfast with goal of <130  - take the blood sugar log to all doctor visits - take the blood sugar meter to all doctor visits  - review educational material entitled " Diabetes Mellitus Basics "received in mail   Why is this important?   Checking your blood sugar at home helps to keep it from getting very high or very low.  Writing the results in a diary or log helps the doctor know how to care for you.  Your blood sugar log should have the time, date and the results.  Also, write down the amount of insulin or other medicine that you take.  Other information, like what you ate, exercise done and how you were feeling, will also be helpful.     Notes:      Track and Manage My Blood Pressure-Hypertension       Timeframe:  Long-Range Goal Priority:  High Start Date:         01/07/21                    Expected End Date:     ongoing                   Follow Up Date 02/03/21    - pick up Rx for blood pressure monitor at primary care provider's office and take to [facility] - ask for help in using the monitor if you need it - check blood pressure weekly - write blood pressure results in a log or diary  - review educational article on managing high blood pressure entitled "Hypertension" received in the mail - if blood pressure does not meet target of <130/<80; please notify your provider    Why is this important?   You won't feel high blood pressure, but it can still hurt your blood vessels.  High blood pressure can cause heart or kidney problems. It can also cause a stroke.  Making lifestyle changes like losing a little weight or eating less salt will help.  Checking your blood pressure at home and at different times of the day can help to control blood pressure.  If the doctor prescribes medicine remember to take it the way the doctor ordered.  Call the office if you cannot afford the medicine or if there are questions about it.     Notes:         Please see education materials related to HTN, HLD, CKD, DM and chronic pain provided as print materials.   The patient verbalized understanding of instructions provided today and agreed to receive a mailed copy of patient instruction and/or educational materials.  Telephone follow up appointment with Managed Medicaid care management team member scheduled for:02/03/21 at 10:30 am  Kelli Churn RN, CCM, [place] Management Coordinator - Managed [place] High Risk [phone]   Following is a copy of your plan of care:  Patient Care Plan: RN Care Manager Plan Of Care     Problem Identified: Chronic Disease Management and Care Coordination Needs for DM, HTN, HLD, CKD  Long-Range Goal: Development of Plan Of Care For Chronic Disease Management and Care Coordination Needs to Assist With Meeting Treatment Goals   Start  Date: 01/07/2021  Expected End Date: 05/07/2021  Priority: High  Note:   Current Barriers:  Knowledge Deficits related to plan of care for management of HTN, HLD, and DMII  Chronic Disease Management support and education needs related to HTN, HLD, and DMII  RNCM Clinical Goal(s):  Patient will verbalize understanding of plan for management of HTN, HLD, and DMII take all medications exactly as prescribed and will call provider for medication related questions attend all scheduled medical appointments: colonoscopy appointment on 01/07/21, initial wound care appointment on 01/13/21 for assessment and treatment of nonhealing abdominal wound, primary care follow up on 02/06/21 demonstrate ongoing adherence to prescribed treatment plan for HTN, HLD, and DMII as evidenced by daily monitoring and recording of fasting CBG,  adherence to ADA/ carb modified, fat modified, no added salt diet, continue 30-40 minutes of exercise on your NuStep 5-7 days/week, adherence to prescribed medication regimen including new diabetes medication,  contacting provider for new or worsened symptoms or questions related to HTN, DM or HLD, CKD continue to work with RN Care Manager to address care management and care coordination needs related to HTN, HLD, and DMII , CKD work with pharmacist to address Medication procurement ( GLP1 receptor agonist) related to DMII through collaboration with Consulting civil engineer, provider, and care team.   Interventions: Inter-disciplinary care team collaboration (see longitudinal plan of care) Evaluation of current treatment plan related to  self management and patient's adherence to plan as established by provider   Chronic Kidney Disease (Status: New goal.)  Last practice recorded BP readings:  BP Readings from Last 3 Encounters:  01/01/21 (!) 156/91  12/30/20 (!) 156/102  09/06/20 (!) 153/95  Most recent eGFR/CrCl:  Lab Results  Component Value Date   EGFR 55 (L) 01/02/2021    No  components found for: CRCL  Assessed the patient's   understanding of chronic kidney disease    Evaluation of current treatment plan related to chronic kidney disease self management and patient's adherence to plan as established by provider      Provided education to patient re: stroke prevention, s/s of heart attack and stroke   Mailed patient written article entitled "Chronic Kidney Disease"  Reviewed prescribed diet:  ADA/ carb modified, fat modified, no added salt diet Reviewed medications with patient and discussed importance of compliance    Provided assistance with obtaining home blood pressure monitor via notifying provider of need for home BP monitor Rx and informing patient his Healthy [facility] health plan will cover the cost of the monitor and that he can obtain it at his pharmacy,  [employer] on the importance of exercise goals with target of 150 minutes per week     Advised patient, providing education and rationale, to monitor blood pressure daily and record, calling PCP for findings outside established parameters   I.e. <938/<18 Discussed complications of poorly controlled blood pressure such as heart disease, stroke, circulatory complications, vision complications, kidney impairment, sexual dysfunction    Reviewed scheduled/upcoming provider appointments including:  colonoscopy appointment on 01/07/21, initial wound care appointment on 01/13/21 for assessment and treatment of nonhealing abdominal wound, primary care follow up on 02/06/21   Discussed plans with patient for ongoing care management follow up and provided patient with direct contact information for care management team      Diabetes:  (  Status: New goal.) Lab Results  Component Value Date   HGBA1C 7.9 (H) 01/02/2021  Provided education to patient about basic DM disease process; mailed written article entitled "Diabetes Mellitus Basics" Assessed patient's understanding of A1C goal: <7%, reviewed  most recent Hgb A1C results of 7.9% on 01/02/21 Assessed frequency of CBG testing and reviewed targets for pre and post meal Reviewed medications with patient and discussed importance of medication adherence;        Reviewed prescribed diet with patient: ADA/ carb modified, fat modified, no added salt diet Offered DM education refresher classes Discussed plans with patient for ongoing care management follow up and provided patient with direct contact information for care management team;      Reviewed scheduled/upcoming provider appointments including:   colonoscopy appointment on 01/07/21, initial wound care appointment on 01/13/21 for assessment and treatment of nonhealing abdominal wound, primary care follow up on 02/06/21      Advised patient, providing education and rationale, to check fasting cbg daily and record        call provider for findings outside established parameters; I.e. fasting CBG consistently >130   Referral made to pharmacy team for assistance with obtaining GLP1 Receptor Agonist and to answer any questions related to the new DM medication;       Assessed social determinant of health barriers;         Health Maintenance (Status: New goal.)  Patient interviewed about adult health maintenance status including Colonoscopy and verified understanding of bowel prep for procedure on 01/08/21   Pneumonia Vaccine- states he received PNA vaccine 4 years ago Influenza Vaccine- states he gets flu shot every year COVID vaccination   - pt refuses at present Regular eye checkups- says he sees his eye doctor every 6 months due to glaucoma and his next appointment is in November  Diabetes Eye Exam    Blood Pressure    Hemoglobin A1c    Diabetes Foot Exam    Advanced Directives- states he does not have and does not want information at this time           Hyperlipidemia:  (Status: New goal.) Lab Results  Component Value Date   CHOL 174 01/02/2021   HDL 37 (L) 01/02/2021   LDLCALC 91  01/02/2021   TRIG 276 (H) 01/02/2021   CHOLHDL 6.1 (H) 12/20/2019     Medication review performed; medication list updated in electronic medical record.  Provider established cholesterol goals reviewed; Counseled on importance of regular laboratory monitoring as prescribed; Provided HLD educational materials; mailed written articles entitled "High Cholesterol" and "Cholesterol Content In Foods"  Reviewed role and benefits of statin for ASCVD risk reduction; Reviewed exercise goals and target of 150 minutes per week; Assessed social determinant of health barriers;   Hypertension: (Status: New goal.) Last practice recorded BP readings:  BP Readings from Last 3 Encounters:  01/01/21 (!) 156/91  12/30/20 (!) 156/102  09/06/20 (!) 153/95  Most recent eGFR/CrCl:  Lab Results  Component Value Date   EGFR 55 (L) 01/02/2021    No components found for: CRCL  Evaluation of current treatment plan related to hypertension self management and patient's adherence to plan as established by provider;   Provided education to patient re: stroke prevention, s/s of heart attack and stroke; Provided assistance with obtaining home blood pressure monitor via verifying that [employer]   covers cost of the home BP monitor and notified provider of need for Rx for home monitor;  Counseled on the  importance of exercise goals with target of 150 minutes per week Discussed plans with patient for ongoing care management follow up and provided patient with direct contact information for care management team; Reviewed scheduled/upcoming provider appointments including: colonoscopy appointment on 01/07/21, initial wound care appointment on 01/13/21 for assessment and treatment of nonhealing abdominal wound, primary care follow up on 02/06/21      Advised patient to discuss need for Rx for home BP monitor with provider; Provided education on prescribed diet CHO modified, no added salt; heart healthy Discussed  complications of poorly controlled blood pressure such as heart disease, stroke, circulatory complications, vision complications, kidney impairment, sexual dysfunction;  Assessed social determinant of health barriers;   Pain:  (Status: New goal.) Pain assessment performed Medications reviewed Reviewed provider established plan for pain management; Discussed importance of adherence to all scheduled medical appointments;colonoscopy appointment on 01/07/21, initial wound care appointment on 01/13/21 for assessment and treatment of nonhealing abdominal wound, primary care follow up on 02/06/21      Counseled on the importance of reporting any/all new or changed pain symptoms or management strategies to pain management provider; Positive reinforcement given to patient regarding ongoing weight loss to meet goal to qualify for bilateral hip surgery  Patient Goals/Self-Care Activities: Patient will self administer medications as prescribed Patient will attend all scheduled provider appointments Patient will call pharmacy for medication refills Patient will continue to perform ADL's independently Patient will continue to perform IADL's independently Patient will call provider office for new concerns or questions Patient will work with pharmacist  to assist with obtaining GLP1 receptor agonist and to answer any questions

## 2021-01-07 NOTE — Telephone Encounter (Signed)
Patient called said his pharmacy needs a prior authorization for this medicine Semaglutide,0.25 or 0.'5MG'$ /DOS, (OZEMPIC, 0.25 OR 0.5 MG/DOSE,) 2 MG/1.5ML SOPN  Pharmacy: [facility]

## 2021-01-08 ENCOUNTER — Other Ambulatory Visit: Payer: Self-pay | Admitting: Nurse Practitioner

## 2021-01-08 MED ORDER — BLOOD PRESSURE MONITOR KIT: 1.0000 | PACK | Freq: Every day | 0 refills | Status: AC

## 2021-01-08 NOTE — Telephone Encounter (Signed)
PA submitted on CoverMyMeds

## 2021-01-09 ENCOUNTER — Encounter (INDEPENDENT_AMBULATORY_CARE_PROVIDER_SITE_OTHER): Payer: Self-pay

## 2021-01-09 NOTE — Progress Notes (Signed)
Pt informed

## 2021-01-10 DIAGNOSIS — I10 Essential (primary) hypertension: Secondary | ICD-10-CM | POA: Diagnosis not present

## 2021-01-12 ENCOUNTER — Encounter: Payer: Self-pay | Admitting: Nurse Practitioner

## 2021-01-12 DIAGNOSIS — E1121 Type 2 diabetes mellitus with diabetic nephropathy: Secondary | ICD-10-CM

## 2021-01-13 ENCOUNTER — Other Ambulatory Visit: Payer: Self-pay

## 2021-01-13 ENCOUNTER — Encounter (HOSPITAL_BASED_OUTPATIENT_CLINIC_OR_DEPARTMENT_OTHER): Payer: Medicaid Other | Attending: Internal Medicine | Admitting: Internal Medicine

## 2021-01-13 DIAGNOSIS — E1122 Type 2 diabetes mellitus with diabetic chronic kidney disease: Secondary | ICD-10-CM | POA: Diagnosis not present

## 2021-01-13 DIAGNOSIS — E119 Type 2 diabetes mellitus without complications: Secondary | ICD-10-CM | POA: Diagnosis not present

## 2021-01-13 DIAGNOSIS — Y838 Other surgical procedures as the cause of abnormal reaction of the patient, or of later complication, without mention of misadventure at the time of the procedure: Secondary | ICD-10-CM | POA: Diagnosis not present

## 2021-01-13 DIAGNOSIS — Z9884 Bariatric surgery status: Secondary | ICD-10-CM | POA: Insufficient documentation

## 2021-01-13 DIAGNOSIS — S31103A Unspecified open wound of abdominal wall, right lower quadrant without penetration into peritoneal cavity, initial encounter: Secondary | ICD-10-CM

## 2021-01-13 DIAGNOSIS — T8189XD Other complications of procedures, not elsewhere classified, subsequent encounter: Secondary | ICD-10-CM | POA: Insufficient documentation

## 2021-01-13 DIAGNOSIS — N183 Chronic kidney disease, stage 3 unspecified: Secondary | ICD-10-CM | POA: Diagnosis not present

## 2021-01-13 DIAGNOSIS — Z6841 Body Mass Index (BMI) 40.0 and over, adult: Secondary | ICD-10-CM | POA: Diagnosis not present

## 2021-01-13 DIAGNOSIS — I129 Hypertensive chronic kidney disease with stage 1 through stage 4 chronic kidney disease, or unspecified chronic kidney disease: Secondary | ICD-10-CM | POA: Diagnosis not present

## 2021-01-13 MED ORDER — GLUCOSE BLOOD VI STRP: ORAL_STRIP | 3 refills | Status: AC

## 2021-01-13 NOTE — Progress Notes (Signed)
[name], [name] ([id number]) Visit Report for 01/13/2021 Abuse/Suicide Risk Screen Details Patient Name: Date of Service: [name], [name] Medical Record Number: [id number] Patient Account Number: [id number] Date of Birth/Sex: Treating RN: 1970/10/26 (50 y.o. [name] Primary Care Brycelyn Gambino: Demetrius Revel Other Clinician: Referring Jalani Cullifer: Treating Mariya Mottley/Extender: Claybon Jabs in Treatment: 0 Abuse/Suicide Risk Screen Items Answer ABUSE RISK SCREEN: Has anyone close [name] you tried [name] hurt or harm you recentlyo No Do you feel uncomfortable with anyone in your familyo No Has anyone forced you do things that you didnt want [name] doo No Electronic Signature(s) Signed: 01/13/2021 5:14:01 [name] By: Baruch Gouty RN, BSN Entered By: Baruch Gouty on 01/13/2021 13:34:46 -------------------------------------------------------------------------------- Activities of Daily Living Details Patient Name: Date of Service: [name], [name] Medical Record Number: [id number] Patient Account Number: [id number] Date of Birth/Sex: Treating RN: [birth year] (50 y.o. [name] Primary Care Koi Zangara: Demetrius Revel Other Clinician: Referring Cressie Betzler: Treating Saide Lanuza/Extender: Claybon Jabs in Treatment: 0 Activities of Daily Living Items Answer Activities of Daily Living (Please select one for each item) Drive Automobile Completely Able T Medications ake Completely Able Use T elephone Completely Able Care for Appearance Completely Able Use T oilet Completely Able Bath / Shower Completely Able Dress Self Completely Able Feed Self Completely Able Walk Completely Able Get In / Out Bed Completely Able Housework Completely Able Prepare Meals Completely Able Handle Money Completely Able Shop for Self Completely Able Electronic Signature(s) Signed: 01/13/2021 5:14:01 [name] By: Baruch Gouty RN,  BSN Entered By: Baruch Gouty on 01/13/2021 13:35:06 -------------------------------------------------------------------------------- Education Screening Details Patient Name: Date of Service: [name] Medical Record Number: [id number] Patient Account Number: [id number] Date of Birth/Sex: Treating RN: Jan 23, 1971 (50 y.o. [name] Primary Care Hoover Grewe: Demetrius Revel Other Clinician: Referring Assad Harbeson: Treating [name]/Extender: Claybon Jabs in Treatment: 0 Primary Learner Assessed: Patient Learning Preferences/Education Level/Primary Language Learning Preference: Explanation, Demonstration, Printed Material Highest Education Level: College or Above Preferred Language: English Cognitive Barrier Language Barrier: No Translator Needed: No Memory Deficit: No Emotional Barrier: No Cultural/Religious Beliefs Affecting Medical Care: No Physical Barrier Impaired Vision: Yes Glasses Impaired Hearing: No Decreased Hand dexterity: No Knowledge/Comprehension Knowledge Level: High Comprehension Level: High Ability [name] understand written instructions: High Ability [name] understand verbal instructions: High Motivation Anxiety Level: Calm Cooperation: Cooperative Education Importance: Acknowledges Need Interest in Health Problems: Asks Questions Perception: Coherent Willingness [name] Engage in Self-Management High Activities: Readiness [name] Engage in Self-Management High Activities: Electronic Signature(s) Signed: 01/13/2021 5:14:01 [name] By: Baruch Gouty RN, BSN Entered By: Baruch Gouty on 01/13/2021 13:35:41 -------------------------------------------------------------------------------- Fall Risk Assessment Details Patient Name: Date of Service: [name] Medical Record Number: [id number] Patient Account Number: [id number] Date of Birth/Sex: Treating RN: [birth year] (50 y.o. [name] Primary Care [name]: Demetrius Revel Other Clinician: Referring [name]: Treating [name]/Extender: Claybon Jabs in Treatment: 0 Fall Risk Assessment Items Have you had 2 or more falls in the last 12 monthso 0 No Have you had any fall that resulted in injury in the last 12 monthso 0 No FALLS RISK SCREEN History of falling - immediate or within 3 months 0 No Secondary diagnosis (Do you have 2 or more medical diagnoseso) 0 No Ambulatory aid None/bed rest/wheelchair/nurse 0 Yes Crutches/cane/walker 0 No Furniture 0 No Intravenous therapy Access/Saline/Heparin  Lock 0 No Gait/Transferring Normal/ bed rest/ wheelchair 0 Yes Weak (short steps with or without shuffle, stooped but able [name] lift head while walking, may seek 0 No support from furniture) Impaired (short steps with shuffle, may have difficulty arising from chair, head down, impaired 0 No balance) Mental Status Oriented [name] own ability 0 Yes Electronic Signature(s) Signed: 01/13/2021 5:14:01 [name] By: Baruch Gouty RN, BSN Entered By: Baruch Gouty on 01/13/2021 13:35:58 -------------------------------------------------------------------------------- Foot Assessment Details Patient Name: Date of Service: [name] Medical Record Number: [id number] Patient Account Number: [id number] Date of Birth/Sex: Treating RN: [birth year] (50 y.o. [name] Primary Care Elyana Grabski: Demetrius Revel Other Clinician: Referring Miliani Deike: Treating [name]/Extender: Claybon Jabs in Treatment: 0 Foot Assessment Items Site Locations + = Sensation present, - = Sensation absent, C = Callus, U = Ulcer R = Redness, W = Warmth, M = Maceration, PU = Pre-ulcerative lesion F = Fissure, S = Swelling, D = Dryness Assessment Right: Left: Other Deformity: No No Prior Foot Ulcer: No No Prior Amputation: No No Charcot Joint: No No Ambulatory Status: Ambulatory  With Help Assistance Device: Cane Gait: Steady Electronic Signature(s) Signed: 01/13/2021 5:14:01 [name] By: Baruch Gouty RN, BSN Entered By: Baruch Gouty on 01/13/2021 13:36:40 -------------------------------------------------------------------------------- Nutrition Risk Screening Details Patient Name: Date of Service: [name] Medical Record Number: [id number] Patient Account Number: [id number] Date of Birth/Sex: Treating RN: 09-Jul-1970 (50 y.o. [name] Primary Care Vung Kush: Demetrius Revel Other Clinician: Referring Zakhai Meisinger: Treating Subhan Hoopes/Extender: Claybon Jabs in Treatment: 0 Height (in): 68 Weight (lbs): 265 Body Mass Index (BMI): 40.3 Nutrition Risk Screening Items Score Screening NUTRITION RISK SCREEN: I have an illness or condition that made me change the kind and/or amount of food I eat 0 No I eat fewer than two meals per day 0 No I eat few fruits and vegetables, or milk products 0 No I have three or more drinks of beer, liquor or wine almost every day 0 No I have tooth or mouth problems that make it hard for me [name] eat 0 No I don't always have enough money [name] buy the food I need 0 No I eat alone most of the time 0 No I take three or more different prescribed or over-the-counter drugs a day 0 No Without wanting [name], I have lost or gained 10 pounds in the last six months 0 No I am not always physically able [name] shop, cook and/or feed myself 0 No Nutrition Protocols Good Risk Protocol 0 No interventions needed Moderate Risk Protocol High Risk Proctocol Risk Level: Good Risk Score: 0 Electronic Signature(s) Signed: 01/13/2021 5:14:01 [name] By: Baruch Gouty RN, BSN Entered By: Baruch Gouty on 01/13/2021 13:36:30

## 2021-01-13 NOTE — Progress Notes (Signed)
KIPP, HONER ([id number]) Visit Report for 01/13/2021 Chief Complaint Document Details Patient Name: Date of Service: [place] [name], TO [place] 01/13/2021 1:15 PM Medical Record Number: [id number] Patient Account Number: [id number] Date of Birth/Sex: Treating RN: [birth year] (50 y.o. Marcheta Grammes Primary Care Provider: Demetrius Revel Other Clinician: Referring Provider: Treating Provider/Extender: Claybon Jabs in Treatment: 0 Information Obtained from: Patient Chief Complaint Right lower quadrant abdominal wound Electronic Signature(s) Signed: 01/13/2021 2:26:32 PM By: Kalman Shan DO Entered By: Kalman Shan on 01/13/2021 14:21:02 -------------------------------------------------------------------------------- HPI Details Patient Name: Date of Service: [name], [name] 01/13/2021 1:15 PM Medical Record Number: [id number] Patient Account Number: [id number] Date of Birth/Sex: Treating RN: [birth year] (50 y.o. Marcheta Grammes Primary Care Provider: Demetrius Revel Other Clinician: Referring Provider: Treating Provider/Extender: Claybon Jabs in Treatment: 0 History of Present Illness HPI Description: Admission 8/22 [name] is a 50 year old male with a past medical history of morbid obesity status post bariatric surgery, type 2 diabetes, osteoarthritis of both hips ony that presents to the clinic for a 65-monthhistory of healing wound to his right lower abdominal wall. He states he has used Santyl on this wound and is currently using a barrier cream. He keeps this covered with a Band-Aid when he goes out. He states that he thinks this started when his pants were rubbing in this area creating a wound. He currently denies signs of infection and states that the wound is almost closed. Electronic Signature(s) Signed: 01/13/2021 2:26:32 PM By: HKalman ShanDO Entered By: HKalman Shanon 01/13/2021  14:23:14 -------------------------------------------------------------------------------- Physical Exam Details Patient Name: Date of [name] 01/13/2021 1:15 PM Medical Record Number: [id number] Account Number: [id number] of Birth/Sex: Treating RN: [birth year](50y.o. MMarcheta GrammesPrimary Care Provider: GDemetrius RevelOther Clinician: Referring Provider: Treating Provider/Extender: HClaybon Jabsin Treatment: 0 Constitutional respirations regular, non-labored and within target range for patient..Marland KitchenPsychiatric pleasant and cooperative. Notes Right lower abdomen: Small open wound with granulation tissue. Appears well-healing. This is over a previous panniculectomy surgical scar. Electronic Signature(s) Signed: 01/13/2021 2:26:32 PM By: HKalman ShanDO Entered By: HKalman Shanon 01/13/2021 14:23:56 -------------------------------------------------------------------------------- Physician Orders Details Patient Name: Date of [place]: [name] 01/13/2021 1:15 PM Medical Record Number: [id number] Account Number: [id number] of Birth/Sex: Treating RN: [birth year](50y.o. MMarcheta GrammesPrimary Care Provider: GDemetrius RevelOther Clinician: Referring Provider: Treating Provider/Extender: HClaybon Jabsin Treatment: 0 Verbal / Phone Orders: No Diagnosis Coding ICD-10 Coding Code Description S31.103A Unspecified open wound of abdominal wall, right lower quadrant without penetration into peritoneal cavity, initial encounter E11.9 Type 2 diabetes mellitus without complications [id number] surgery status Follow-up Appointments ppointment in 2 weeks. - with Dr. HHeber CarolinaReturn A Bathing/ Shower/ Hygiene May shower and wash wound with soap and water. Wound Treatment Wound #3 - Abdomen - Lower Quadrant Wound Laterality: Right Cleanser: Soap and Water 1 x Per [id number] Days Discharge Instructions: May shower and wash wound with dial antibacterial soap and water prior to dressing change. Topical: Coloplast Cream 1 x Per Day/15 Days Secondary Dressing: Zetuvit Plus Silicone Border Dressing 4x4 (in/in) 1 x Per [id number] Discharge Instructions: Apply silicone border over primary dressing as directed. Electronic Signature(s) Signed: 01/13/2021 2:26:32 PM By: HKalman ShanDO Entered By: HKalman Shanon 01/13/2021 14:24:12 -------------------------------------------------------------------------------- Problem List Details Patient  Name: Date of Service: [name], [name] 01/13/2021 1:15 PM Medical Record Number: [id number] Patient Account Number: [id number] Date of Birth/Sex: Treating RN: 1970/12/30 (50 y.o. Marcheta Grammes Primary Care Provider: Demetrius Revel Other Clinician: Referring Provider: Treating Provider/Extender: Claybon Jabs in Treatment: 0 Active Problems ICD-10 Encounter Code Description Active Date MDM Diagnosis S31.103A Unspecified open wound of abdominal wall, right lower quadrant without 01/13/2021 No Yes penetration into peritoneal cavity, initial encounter E11.9 Type 2 diabetes mellitus without complications [id number] No Yes Z98.84 Bariatric surgery status 01/13/2021 No Yes Inactive Problems Resolved Problems Electronic Signature(s) Signed: 01/13/2021 2:26:32 PM By: Kalman Shan DO Entered By: Kalman Shan on 01/13/2021 14:20:09 -------------------------------------------------------------------------------- Progress Note Details Patient Name: Date of Service: [name], [name] 01/13/2021 1:15 PM Medical Record Number: [id number] Patient Account Number: [id number] Date of Birth/Sex: Treating RN: 1970-09-07 (50 y.o. Marcheta Grammes Primary Care Provider: Demetrius Revel Other Clinician: Referring Provider: Treating Provider/Extender: Claybon Jabs in Treatment:  0 Subjective Chief Complaint Information obtained from Patient Right lower quadrant abdominal wound History of Present Illness (HPI) Admission 8/22 [name] is a 50 year old male with a past medical history of morbid obesity status post bariatric surgery, type 2 diabetes, osteoarthritis of both hips ony that presents to the clinic for a 78-monthhistory of healing wound to his right lower abdominal wall. He states he has used Santyl on this wound and is currently using a barrier cream. He keeps this covered with a Band-Aid when he goes out. He states that he thinks this started when his pants were rubbing in this area creating a wound. He currently denies signs of infection and states that the wound is almost closed. Patient History Information obtained from Patient. Allergies No Known Allergies Family History Heart Disease - Mother, Hypertension - Siblings, No family history of Cancer, Diabetes, Hereditary Spherocytosis, Kidney Disease, Lung Disease, Seizures, Stroke, Thyroid Problems, Tuberculosis. Social History Never smoker, Marital Status - Single, Alcohol Use - Never, Drug Use - No History, Caffeine Use - Moderate. Medical History Eyes Denies history of Cataracts, Glaucoma, Optic Neuritis Ear/Nose/Mouth/Throat Denies history of Chronic sinus problems/congestion, Middle ear problems Hematologic/Lymphatic Patient has history of Anemia - iron deficiency Denies history of Hemophilia, Human Immunodeficiency Virus, Lymphedema, Sickle Cell Disease Respiratory Patient has history of Sleep Apnea - no CPAP Denies history of Aspiration, Asthma, Chronic Obstructive Pulmonary Disease (COPD), Pneumothorax, Tuberculosis Cardiovascular Patient has history of Hypertension Denies history of Angina, Arrhythmia, Congestive Heart Failure, Coronary Artery Disease, Deep Vein Thrombosis, Hypotension, Myocardial Infarction, Peripheral Arterial Disease, Peripheral Venous Disease, Phlebitis,  Vasculitis Gastrointestinal Denies history of Cirrhosis , Colitis, Crohnoos, Hepatitis A, Hepatitis B, Hepatitis C Endocrine Patient has history of Type II Diabetes Denies history of Type I Diabetes Genitourinary Denies history of End Stage Renal Disease Immunological Denies history of Lupus Erythematosus, Raynaudoos, Scleroderma Integumentary (Skin) Denies history of History of Burn Musculoskeletal Patient has history of Gout, Osteoarthritis - hips Denies history of Rheumatoid Arthritis, Osteomyelitis Neurologic Denies history of Dementia, Neuropathy, Quadriplegia, Paraplegia, Seizure Disorder Oncologic Denies history of Received Chemotherapy, Received Radiation Psychiatric Denies history of Anorexia/bulimia, Confinement Anxiety Patient is treated with Controlled Diet. Blood sugar is tested. Hospitalization/Surgery History - gastric bypass. - total panniculectom. - cardioversion. - hernia repair. Medical A Surgical History Notes nd Constitutional Symptoms (General Health) obesity Cardiovascular hyperlipidemia Gastrointestinal Hx gastric bypass, GERD Genitourinary CKD stage 3 Neurologic stroke without residual, chronic lower back apin Review of Systems (  ROS) Constitutional Symptoms (General Health) Denies complaints or symptoms of Fatigue, Fever, Chills, Marked Weight Change. Eyes Complains or has symptoms of Glasses / Contacts. Denies complaints or symptoms of Dry Eyes, Vision Changes. Ear/Nose/Mouth/Throat Denies complaints or symptoms of Chronic sinus problems or rhinitis. Respiratory Denies complaints or symptoms of Chronic or frequent coughs, Shortness of Breath. Cardiovascular Complains or has symptoms of Chest pain. Gastrointestinal Denies complaints or symptoms of Frequent diarrhea, Nausea, Vomiting. Endocrine Denies complaints or symptoms of Heat/cold intolerance. Genitourinary Denies complaints or symptoms of Frequent urination. Integumentary  (Skin) Complains or has symptoms of Wounds - RLQ abd fold. Musculoskeletal Denies complaints or symptoms of Muscle Pain, Muscle Weakness. Neurologic Denies complaints or symptoms of Numbness/parasthesias. Psychiatric Denies complaints or symptoms of Claustrophobia, Suicidal. Objective Constitutional respirations regular, non-labored and within target range for patient.. Vitals Time Taken: 1:19 PM, Height: 68 in, Source: Stated, Weight: 265 lbs, Source: Stated, BMI: 40.3, Temperature: 98.4 F, Pulse: 88 bpm, Respiratory Rate: 18 breaths/min, Blood Pressure: 160/86 mmHg, Capillary Blood Glucose: 144 mg/dl. General Notes: glucose per pt report this am Psychiatric pleasant and cooperative. General Notes: Right lower abdomen: Small open wound with granulation tissue. Appears well-healing. This is over a previous panniculectomy surgical scar. Integumentary (Hair, Skin) Wound #3 status is Open. Original cause of wound was Shear/Friction. The date acquired was: 07/23/2020. The wound is located on the Right Abdomen - Lower Quadrant. The wound measures 0.3cm length x 0.5cm width x 0.1cm depth; 0.118cm^2 area and 0.012cm^3 volume. There is Fat Layer (Subcutaneous Tissue) exposed. There is no tunneling or undermining noted. There is a small amount of serosanguineous drainage noted. The wound margin is distinct with the outline attached to the wound base. There is large (67-100%) red granulation within the wound bed. There is no necrotic tissue within the wound bed. Assessment Active Problems ICD-10 Unspecified open wound of abdominal wall, right lower quadrant without penetration into peritoneal cavity, initial encounter Type 2 diabetes mellitus without complications Bariatric surgery status Patient presents with a 18-monthhistory of healing wound to his right lower abdomen. It is a very small wound with granulation tissue present and no signs of infection. He is currently using barrier cream to the  site and keeping it covered with foam border dressings. I recommended continuing this. Upon review of the EMR it looks like this started as a cutaneous abscess back in February 2022. It has since been healing. I will see him at follow-up in 2 weeks. Plan Follow-up Appointments: Return Appointment in 2 weeks. - with Dr. HHeber CarolinaBathing/ Shower/ Hygiene: May shower and wash wound with soap and water. WOUND #3: - Abdomen - Lower Quadrant Wound Laterality: Right Cleanser: Soap and Water 1 x Per [id number] Discharge Instructions: May shower and wash wound with dial antibacterial soap and water prior to dressing change. Topical: Coloplast Cream 1 x Per Day/15 Days Secondary Dressing: Zetuvit Plus Silicone Border Dressing 4x4 (in/in) 1 x Per [id number] Discharge Instructions: Apply silicone border over primary dressing as directed. 1. Barrier cream with foam border dressing 2. Follow-up in 2 weeks Electronic Signature(s) Signed: 01/13/2021 2:26:32 PM By: HKalman ShanDO Entered By: HKalman Shanon 01/13/2021 14:25:55 -------------------------------------------------------------------------------- HxROS Details Patient Name: Date of [place]: [name] 01/13/2021 1:15 PM Medical Record Number: [id number] Account Number: [id number] of Birth/Sex: Treating RN: [birth year](50y.o. MErnestene MentionPrimary Care Provider: GDemetrius RevelOther Clinician: Referring Provider: Treating Provider/Extender: HClaybon Jabsin Treatment: 0  Information Obtained From Patient Constitutional Symptoms (General Health) Complaints and Symptoms: Negative for: Fatigue; Fever; Chills; Marked Weight Change Medical History: Past Medical History Notes: obesity Eyes Complaints and Symptoms: Positive for: Glasses / Contacts Negative for: Dry Eyes; Vision Changes Medical History: Negative for: Cataracts; Glaucoma; Optic Neuritis Ear/Nose/Mouth/Throat Complaints  and Symptoms: Negative for: Chronic sinus problems or rhinitis Medical History: Negative for: Chronic sinus problems/congestion; Middle ear problems Respiratory Complaints and Symptoms: Negative for: Chronic or frequent coughs; Shortness of Breath Medical History: Positive for: Sleep Apnea - no CPAP Negative for: Aspiration; Asthma; Chronic Obstructive Pulmonary Disease (COPD); Pneumothorax; Tuberculosis Cardiovascular Complaints and Symptoms: Positive for: Chest pain Medical History: Positive for: Hypertension Negative for: Angina; Arrhythmia; Congestive Heart Failure; Coronary Artery Disease; Deep Vein Thrombosis; Hypotension; Myocardial Infarction; Peripheral Arterial Disease; Peripheral Venous Disease; Phlebitis; Vasculitis Past Medical History Notes: hyperlipidemia Gastrointestinal Complaints and Symptoms: Negative for: Frequent diarrhea; Nausea; Vomiting Medical History: Negative for: Cirrhosis ; Colitis; Crohns; Hepatitis A; Hepatitis B; Hepatitis C Past Medical History Notes: Hx gastric bypass, GERD Endocrine Complaints and Symptoms: Negative for: Heat/cold intolerance Medical History: Positive for: Type II Diabetes Negative for: Type I Diabetes Time with diabetes: since 1996 Treated with: Diet Blood sugar tested every day: Yes Tested : once Genitourinary Complaints and Symptoms: Negative for: Frequent urination Medical History: Negative for: End Stage Renal Disease Past Medical History Notes: CKD stage 3 Integumentary (Skin) Complaints and Symptoms: Positive for: Wounds - RLQ abd fold Medical History: Negative for: History of Burn Musculoskeletal Complaints and Symptoms: Negative for: Muscle Pain; Muscle Weakness Medical History: Positive for: Gout; Osteoarthritis - hips Negative for: Rheumatoid Arthritis; Osteomyelitis Neurologic Complaints and Symptoms: Negative for: Numbness/parasthesias Medical History: Negative for: Dementia; Neuropathy;  Quadriplegia; Paraplegia; Seizure Disorder Past Medical History Notes: stroke without residual, chronic lower back apin Psychiatric Complaints and Symptoms: Negative for: Claustrophobia; Suicidal Medical History: Negative for: Anorexia/bulimia; Confinement Anxiety Hematologic/Lymphatic Medical History: Positive for: Anemia - iron deficiency Negative for: Hemophilia; Human Immunodeficiency Virus; Lymphedema; Sickle Cell Disease Immunological Medical History: Negative for: Lupus Erythematosus; Raynauds; Scleroderma Oncologic Medical History: Negative for: Received Chemotherapy; Received Radiation Immunizations Pneumococcal Vaccine: Received Pneumococcal Vaccination: Yes Received Pneumococcal Vaccination On or After 60th Birthday: No Implantable Devices None Hospitalization / Surgery History Type of Hospitalization/Surgery gastric bypass total panniculectom cardioversion hernia repair Family and Social History Cancer: No; Diabetes: No; Heart Disease: Yes - Mother; Hereditary Spherocytosis: No; Hypertension: Yes - Siblings; Kidney Disease: No; Lung Disease: No; Seizures: No; Stroke: No; Thyroid Problems: No; Tuberculosis: No; Never smoker; Marital Status - Single; Alcohol Use: Never; Drug Use: No History; Caffeine Use: Moderate; Financial Concerns: No; Food, Clothing or Shelter Needs: No; Support System Lacking: No; Transportation Concerns: No Electronic Signature(s) Signed: 01/13/2021 2:26:32 PM By: Kalman Shan DO Signed: 01/13/2021 5:14:01 PM By: Baruch Gouty RN, BSN Entered By: Baruch Gouty on 01/13/2021 13:34:38 -------------------------------------------------------------------------------- SuperBill Details Patient Name: Date of Service: [place] LA HA [place], [name] 01/13/2021 Medical Record Number: [id number] Patient Account Number: [id number] Date of Birth/Sex: Treating RN: September 15, 1970 (50 y.o. Marcheta Grammes Primary Care Provider: Demetrius Revel Other  Clinician: Referring Provider: Treating Provider/Extender: Claybon Jabs in Treatment: 0 Diagnosis Coding ICD-10 Codes Code Description [phone] Unspecified open wound of abdominal wall, right lower quadrant without penetration into peritoneal cavity, initial encounter E11.9 Type 2 diabetes mellitus without complications [id number] Bariatric surgery status Facility Procedures CPT4 Code: [id number] Description: 99213 - WOUND CARE VISIT-LEV 3 EST PT Modifier: Quantity: 1 Physician Procedures : CPT4 Code Description Modifier  [id number] - WC PHYS LEVEL 3 - EST PT ICD-10 Diagnosis Description S31.103A Unspecified open wound of abdominal wall, right lower quadrant without penetration into peritoneal cavity encounter E11.9 Type 2 diabetes  mellitus without complications [id number] Bariatric surgery status Quantity: 1 , initial Electronic Signature(s) Signed: 01/13/2021 2:26:32 PM By: Kalman Shan DO Entered By: Kalman Shan on 01/13/2021 14:26:12

## 2021-01-14 ENCOUNTER — Other Ambulatory Visit: Payer: Self-pay

## 2021-01-14 ENCOUNTER — Other Ambulatory Visit: Payer: Self-pay | Admitting: Nurse Practitioner

## 2021-01-14 DIAGNOSIS — E785 Hyperlipidemia, unspecified: Secondary | ICD-10-CM

## 2021-01-14 MED ORDER — ATORVASTATIN CALCIUM 40 MG PO TABS: 40.0000 mg | ORAL_TABLET | Freq: Every day | ORAL | 3 refills | Status: DC

## 2021-01-14 NOTE — Patient Outreach (Signed)
Medicaid Managed Care   Nurse Care Manager Note  01/07/2021 Name:  [name] MRN:  [id number] DOB:  03-May-1971  [name] is an 50 y.o. year old male who is a primary [name] of [name].  The [facility] Managed Care Coordination team was consulted for assistance with:    HTN HLD DMI CKD Stage 3  [name] was given information about Medicaid Managed Care Coordination team services today. [name] agreed to services and verbal consent obtained.  Engaged with [name] by telephone for initial visit in response to provider referral for case management and/or care coordination services.   Assessments/Interventions:  Review of past medical history, allergies, medications, health status, including review of consultants reports, laboratory and other test data, was performed as part of comprehensive evaluation and provision of chronic care management services.  SDOH (Social Determinants of Health) assessments and interventions performed: SDOH Interventions    Flowsheet Row Most Recent Value  SDOH Interventions   Food Insecurity Interventions Intervention Not Indicated  Financial Strain Interventions Intervention Not Indicated  Housing Interventions Intervention Not Indicated  [[name] states he has applied for Section 8 housing]  Physical Activity Interventions Intervention Not Indicated  Stress Interventions Intervention Not Indicated  Transportation Interventions Intervention Not Indicated       Care Plan  [name] Care Plan: RN Care Manager Plan Of Care       Problem Identified: Chronic Disease Management and Care Coordination Needs for DM, HTN, HLD, CKD         Long-Range Goal: Development of Plan Of Care For Chronic Disease Management and Care Coordination Needs to Assist With Meeting Treatment Goals    Start Date: 01/07/2021  Expected End Date: 05/07/2021  Priority: High  Note:   Current Barriers:  Knowledge Deficits related to plan of care  for management of HTN, HLD, and DMII  Chronic Disease Management support and education needs related to HTN, HLD, and DMII   RNCM Clinical Goal(s):  [name] will verbalize understanding of plan for management of HTN, HLD, and DMII take all medications exactly as prescribed and will call provider for medication related questions attend all scheduled medical appointments: colonoscopy appointment on 01/07/21, initial wound care appointment on 01/13/21 for assessment and treatment of nonhealing abdominal wound, primary care follow up on 02/06/21 demonstrate ongoing adherence to prescribed treatment plan for HTN, HLD, and DMII as evidenced by daily monitoring and recording of fasting CBG,  adherence to ADA/ carb modified, fat modified, no added salt diet, continue 30-40 minutes of exercise on your NuStep 5-7 days/week, adherence to prescribed medication regimen including new diabetes medication,  contacting provider for new or worsened symptoms or questions related to HTN, DM or HLD, CKD continue to work with RN Care Manager to address care management and care coordination needs related to HTN, HLD, and DMII , CKD work with pharmacist to address Medication procurement ( GLP1 receptor agonist) related to DMII through collaboration with Consulting civil engineer, provider, and care team.    Interventions: Inter-disciplinary care team collaboration (see longitudinal plan of care) Evaluation of current treatment plan related to  self management and [name]'s adherence to plan as established by provider     Chronic Kidney Disease (Status: New goal.)  Last practice recorded BP readings:     BP Readings from Last 3 Encounters:  01/01/21 (!) 156/91  12/30/20 (!) 156/102  09/06/20 (!) 153/95  Most recent eGFR/CrCl:       Lab Results  Component  Value Date    EGFR 55 (L) 01/02/2021    No components found for: CRCL   Assessed the [name]'s   understanding of chronic kidney disease    Evaluation of current treatment  plan related to chronic kidney disease self management and [name]'s adherence to plan as established by provider      Provided education to [name] re: stroke prevention, s/s of heart attack and stroke   Mailed [name] written article entitled "Chronic Kidney Disease"  Reviewed prescribed diet:  ADA/ carb modified, fat modified, no added salt diet Reviewed medications with [name] and discussed importance of compliance    Provided assistance with obtaining home blood pressure monitor via notifying provider of need for home BP monitor Rx and informing [name] his [facility] health plan will cover the cost of the monitor and that he can obtain it at his pharmacy,  [employer] on the importance of exercise goals with target of 150 minutes per week     Advised [name], providing education and rationale, to monitor blood pressure daily and record, calling PCP for findings outside established parameters   I.e. <448/<18 Discussed complications of poorly controlled blood pressure such as heart disease, stroke, circulatory complications, vision complications, kidney impairment, sexual dysfunction    Reviewed scheduled/upcoming provider appointments including:  colonoscopy appointment on 01/07/21, initial wound care appointment on 01/13/21 for assessment and treatment of nonhealing abdominal wound, primary care follow up on 02/06/21   Discussed plans with [name] for ongoing care management follow up and provided [name] with direct contact information for care management team        Diabetes:  (Status: New goal.)      Lab Results  Component Value Date    HGBA1C 7.9 (H) 01/02/2021  Provided education to [name] about basic DM disease process; mailed written article entitled "Diabetes Mellitus Basics" Assessed [name]'s understanding of A1C goal: <7%, reviewed most recent Hgb A1C results of 7.9% on 01/02/21 Assessed frequency of CBG testing and reviewed targets for pre and post  meal Reviewed medications with [name] and discussed importance of medication adherence;        Reviewed prescribed diet with [name]: ADA/ carb modified, fat modified, no added salt diet Offered DM education refresher classes Discussed plans with [name] for ongoing care management follow up and provided [name] with direct contact information for care management team;      Reviewed scheduled/upcoming provider appointments including:   colonoscopy appointment on 01/07/21, initial wound care appointment on 01/13/21 for assessment and treatment of nonhealing abdominal wound, primary care follow up on 02/06/21      Advised [name], providing education and rationale, to check fasting cbg daily and record        call provider for findings outside established parameters; I.e. fasting CBG consistently >130   Referral made to pharmacy team for assistance with obtaining GLP1 Receptor Agonist and to answer any questions related to the new DM medication;       Assessed social determinant of health barriers;          Health Maintenance (Status: New goal.)  [name] interviewed about adult health maintenance status including Colonoscopy and verified understanding of bowel prep for procedure on 01/08/21   Pneumonia Vaccine- states he received PNA vaccine 4 years ago Influenza Vaccine- states he gets flu shot every year COVID vaccination   - pt refuses at present Regular eye checkups- says he sees his eye doctor every 6 months due to glaucoma and his  next appointment is in November  Diabetes Eye Exam    Blood Pressure    Hemoglobin A1c    Diabetes Foot Exam    Advanced Directives- states he does not have and does not want information at this time                Hyperlipidemia:  (Status: New goal.)      Lab Results  Component Value Date    CHOL 174 01/02/2021    HDL 37 (L) 01/02/2021    LDLCALC 91 01/02/2021    TRIG 276 (H) 01/02/2021    CHOLHDL 6.1 (H) 12/20/2019      Medication review performed;  medication list updated in electronic medical record.  Provider established cholesterol goals reviewed; Counseled on importance of regular laboratory monitoring as prescribed; Provided HLD educational materials; mailed written articles entitled "High Cholesterol" and "Cholesterol Content In Foods"  Reviewed role and benefits of statin for ASCVD risk reduction; Reviewed exercise goals and target of 150 minutes per week; Assessed social determinant of health barriers;    Hypertension: (Status: New goal.) Last practice recorded BP readings:     BP Readings from Last 3 Encounters:  01/01/21 (!) 156/91  12/30/20 (!) 156/102  09/06/20 (!) 153/95  Most recent eGFR/CrCl:       Lab Results  Component Value Date    EGFR 55 (L) 01/02/2021    No components found for: CRCL   Evaluation of current treatment plan related to hypertension self management and [name]'s adherence to plan as established by provider;   Provided education to [name] re: stroke prevention, s/s of heart attack and stroke; Provided assistance with obtaining home blood pressure monitor via verifying that [employer]   covers cost of the home BP monitor and notified provider of need for Rx for home monitor;  Counseled on the importance of exercise goals with target of 150 minutes per week Discussed plans with [name] for ongoing care management follow up and provided [name] with direct contact information for care management team; Reviewed scheduled/upcoming provider appointments including: colonoscopy appointment on 01/07/21, initial wound care appointment on 01/13/21 for assessment and treatment of nonhealing abdominal wound, primary care follow up on 02/06/21      Advised [name] to discuss need for Rx for home BP monitor with provider; Provided education on prescribed diet CHO modified, no added salt; heart healthy Discussed complications of poorly controlled blood pressure such as heart disease, stroke, circulatory  complications, vision complications, kidney impairment, sexual dysfunction;  Assessed social determinant of health barriers;    Pain:  (Status: New goal.) Pain assessment performed Medications reviewed Reviewed provider established plan for pain management; Discussed importance of adherence to all scheduled medical appointments;colonoscopy appointment on 01/07/21, initial wound care appointment on 01/13/21 for assessment and treatment of nonhealing abdominal wound, primary care follow up on 02/06/21      Counseled on the importance of reporting any/all new or changed pain symptoms or management strategies to pain management provider; Positive reinforcement given to [name] regarding ongoing weight loss to meet goal to qualify for bilateral hip surgery   [name] Goals/Self-Care Activities: [name] will self administer medications as prescribed [name] will attend all scheduled provider appointments [name] will call pharmacy for medication refills [name] will continue to perform ADL's independently [name] will continue to perform IADL's independently [name] will call provider office for new concerns or questions [name] will work with pharmacist  to assist with obtaining GLP1 receptor agonist and to answer any questions  No Known Allergies  Medications Reviewed Today     Reviewed by [name], RN (Registered Nurse) on 01/07/21 at 1145  Med List Status: <None>   Medication Order Taking? Sig Documenting Provider Last Dose Status Informant  acetaminophen (TYLENOL) 500 MG tablet [id number] Yes Take 1,000 mg by mouth every 6 (six) hours as needed for moderate pain. [provider] Taking Active Self  atorvastatin (LIPITOR) 10 MG tablet [id number] Yes TAKE ONE TABLET BY MOUTH ONCE DAILY. [name] Taking Active Self           Med Note [name], Renne Musca Jan 07, 2021 10:31 AM) [name] in the morning  blood glucose meter kit and supplies KIT [id number] Yes  Test daily Dx  r73.03 [name], DO Taking Active Self  cyclobenzaprine (FLEXERIL) 10 MG tablet [id number] Yes Take 1 tablet (10 mg total) by mouth 2 (two) times daily as needed for muscle spasms. Perlie Mayo, [name] Taking Active Self           Med Note [name], [name]   Tue Jan 07, 2021 10:32 AM) [name] 2 in the morning and 1 at night  ferrous sulfate 324 MG TBEC [id number] Yes Take 324 mg by mouth every other day. [provider] Taking Active   glucose blood test strip [id number] Yes Use as instructed Perlie Mayo, [name] Taking Active Self  latanoprost (XALATAN) 0.005 % ophthalmic solution [id number] Yes Place 1 drop into both eyes at bedtime. [provider] Taking Active Self  methylcellulose oral powder [id number] Yes Take 1 packet by mouth daily. [provider] Taking Active Self  Multiple Vitamin (MULTIVITAMIN WITH MINERALS) TABS tablet [id number] Yes Take 1 tablet by mouth daily. [provider] Taking Active Self  Na Sulfate-K Sulfate-Mg Sulf (SUPREP BOWEL PREP KIT) 17.5-3.13-1.6 GM/177ML SOLN [id number]  Take 1 kit by mouth as directed. Montez Morita, Quillian Quince, MD  Active Self  NARCAN 4 MG/0.1ML LIQD nasal spray kit [id number]  Place 0.4 mg into the nose once. [provider]  Active Self  Oxycodone HCl 20 MG TABS [id number] Yes Take 20 mg by mouth every 6 (six) hours as needed (pain). [provider] Taking Active Self           Med Note [name]   Tue Jan 07, 2021 10:36 AM) Takes prn  Semaglutide, 1 MG/DOSE, 4 MG/3ML SOPN [id number] No Inject 1 mg as directed once a week.  [name] not taking: Reported on 01/07/2021   [name] Not Taking Active            Med Note [name], Renne Musca Jan 07, 2021 11:39 AM) waiting for approval  Semaglutide,0.25 or 0.5MG /DOS, (OZEMPIC, 0.25 OR 0.5 MG/DOSE,) 2 MG/1.5ML SOPN [id number] No Inject 0.5 mg into the skin once a week.  [name] not taking: Reported on 01/07/2021   [name] Not Taking Active            Med Note [name] Jan 07, 2021 11:39 AM) Waiting for approval   sildenafil (VIAGRA) 50 MG tablet [id number] Yes Take 1 tablet (50 mg total) by mouth daily as needed for erectile dysfunction. Perlie Mayo, [name] Taking Active Self           Med Note [name] Jan 07, 2021 10:37 AM) Not taking   timolol (BETIMOL) 0.5 % ophthalmic solution [id number] Yes Place  1 drop into both eyes in the morning. [provider] Taking Active Self            [name] Active Problem List   Diagnosis Date Noted   Iron deficiency anemia 12/30/2020   History of colonic polyps 12/30/2020   Unilateral primary osteoarthritis, right hip 08/13/2020   Unilateral primary osteoarthritis, left hip 08/13/2020   Muscle spasms of both lower extremities 05/23/2020   Need for immunization against influenza 03/06/2020   Primary osteoarthritis of both hips 07/10/2019   Vitamin D deficiency 04/13/2019   Morbid obesity with body mass index (BMI) of 40.0 to 44.9 in adult ([place]) 04/13/2019   Kidney disease, chronic, stage III (GFR 30-59 ml/min) ([place]) 07/20/2017   History of cerebrovascular accident 07/20/2017   Gastroesophageal reflux disease 07/20/2017   Gastric bypass status for obesity 02/11/2017   Type 2 diabetes mellitus with diabetic nephropathy ([place]) 08/03/2008   Hyperlipidemia 08/03/2008   SLEEP APNEA 08/03/2008    Conditions to be addressed/monitored per PCP order:  HTN, HLD, DMII, and CKD Stage 3  There are no care plans that you recently modified to display for this [name].   Follow Up:  [name] agrees to Care Plan and Follow-up.  Plan: The Managed Medicaid care management team will reach out to the [name] again over the next 30 days.  Date/time of next scheduled RN care management/care coordination outreach:  02/03/21  Kelli Churn RN, CCM, [place] Management Coordinator - Managed  [place] High Risk [phone]

## 2021-01-14 NOTE — Patient Outreach (Signed)
Medicaid Managed Care    Pharmacy Note  01/14/2021 Name: [name] MRN: [id number] DOB: June 15, 1970  [name] is a 50 y.o. year old male who is a primary care patient of [name] Larsson, NP. The [name] Managed Care Coordination team was consulted for assistance with disease management and care coordination needs.    Engaged with patient Engaged with patient by telephone for initial visit in response to referral for case management and/or care coordination services.  [name] was given information about Managed Medicaid Care Coordination team services today. [name] agreed to services and verbal consent obtained.   Objective:  Lab Results  Component Value Date   CREATININE 1.52 (H) 01/02/2021   CREATININE 2.05 (H) 01/10/2020   CREATININE 1.53 (H) 12/20/2019    Lab Results  Component Value Date   HGBA1C 7.9 (H) 01/02/2021       Component Value Date/Time   CHOL 174 01/02/2021 0938   TRIG 276 (H) 01/02/2021 0938   HDL 37 (L) 01/02/2021 0938   CHOLHDL 6.1 (H) 12/20/2019 1011   LDLCALC 91 01/02/2021 0938   LDLCALC 121 (H) 12/20/2019 1011    Other: (TSH, CBC, Vit D, etc.)  Clinical ASCVD: Yes  The 10-year ASCVD risk score [name] DC Jr., et al., 2013) is: 12.3%   Values used to calculate the score:     Age: 61 years     Sex: Male     Is Non-Hispanic African American: No     Diabetic: Yes     Tobacco smoker: No     Systolic Blood Pressure: 675 mmHg     Is BP treated: Yes     HDL Cholesterol: 37 mg/dL     Total Cholesterol: 174 mg/dL    Other: (CHADS2VASc if Afib, PHQ9 if depression, MMRC or CAT for COPD, ACT, DEXA)  BP Readings from Last 3 Encounters:  01/01/21 (!) 156/91  12/30/20 (!) 156/102  09/06/20 (!) 153/95    Assessment/Interventions: Review of patient past medical history, allergies, medications, health status, including review of consultants reports, laboratory and other test data, was performed as part of comprehensive evaluation and  provision of chronic care management services.   Lipids Lab Results  Component Value Date   CHOL 174 01/02/2021   CHOL 206 (H) 12/20/2019   CHOL 223 (H) 04/13/2019   Lab Results  Component Value Date   HDL 37 (L) 01/02/2021   HDL 34 (L) 12/20/2019   HDL 34 (L) 04/13/2019   Lab Results  Component Value Date   LDLCALC 91 01/02/2021   LDLCALC 121 (H) 12/20/2019   LDLCALC 141 (H) 04/13/2019   Lab Results  Component Value Date   TRIG 276 (H) 01/02/2021   TRIG 354 (H) 12/20/2019   TRIG 338 (H) 04/13/2019   Lab Results  Component Value Date   CHOLHDL 6.1 (H) 12/20/2019   CHOLHDL 6.6 (H) 04/13/2019   CHOLHDL 5.4 (H) 01/26/2018   No results found for: LDLDIRECT Atorvastatin 75m August 2022: Candidate for High intensity statin due to risk factors, will ask PCP  DM Lab Results  Component Value Date   HGBA1C 7.9 (H) 01/02/2021   HGBA1C 7.4 (A) 11/29/2019   HGBA1C 7.4 11/29/2019   HGBA1C 7.4 (A) 11/29/2019   HGBA1C 7.4 (A) 11/29/2019   Lab Results  Component Value Date   LDLCALC 91 01/02/2021   CREATININE 1.52 (H) 01/02/2021    Lab Results  Component Value Date   NA 135 01/02/2021  K 4.6 01/02/2021   CREATININE 1.52 (H) 01/02/2021   GFRNONAA 37 (L) 01/10/2020   GFRAA 43 (L) 01/10/2020   GLUCOSE 165 (H) 01/02/2021    Lab Results  Component Value Date   WBC 9.4 01/02/2021   HGB 15.9 01/02/2021   HCT 47.7 01/02/2021   MCV 92 01/02/2021   PLT 207 01/02/2021   Semaglutide 63m/week August 2022: Wasn't able to get Semaglutide but PA was approved for 1 year last week. Having no issues with meds and doing well  SDOH (Social Determinants of Health) assessments and interventions performed:    Care Plan  No Known Allergies  Medications Reviewed Today     Reviewed by HBarrington Ellison RN (Registered Nurse) on 01/07/21 at 1145  Med List Status: <None>   Medication Order Taking? Sig Documenting Provider Last Dose Status Informant  acetaminophen (TYLENOL) 500  MG tablet [id number] Take 1,000 mg by mouth every 6 (six) hours as needed for moderate pain. [provider] Taking Active Self  atorvastatin (LIPITOR) 10 MG tablet [id number] TAKE ONE TABLET BY MOUTH ONCE DAILY. GNoreene Larsson NP Taking Active Self           Med Note ([name] JRenne MuscaAug 16, 2022 10:31 AM) TDewaine Congerin the morning  blood glucose meter kit and supplies KIT [id number] Test daily Dx  r73.03 WBriscoe Deutscher DO Taking Active Self  cyclobenzaprine (FLEXERIL) 10 MG tablet [id number] Take 1 tablet (10 mg total) by mouth 2 (two) times daily as needed for muscle spasms. MPerlie Mayo NP Taking Active Self           Med Note ([name] JTrude [name]  Tue Jan 07, 2021 10:32 AM) TDewaine Conger2 in the morning and 1 at night  ferrous sulfate 324 MG TBEC [id number] Take 324 mg by mouth every other day. [provider] Taking Active   glucose blood test strip [id number] Use as instructed MPerlie Mayo NP Taking Active Self  latanoprost (XALATAN) 0.005 % ophthalmic solution [id number] Place 1 drop into both eyes at bedtime. [provider] Taking Active Self  methylcellulose oral powder [id number] Take 1 packet by mouth daily. [provider] Taking Active Self  Multiple Vitamin (MULTIVITAMIN WITH MINERALS) TABS tablet [id number] Take 1 tablet by mouth daily. [provider] Taking Active Self  Na Sulfate-K Sulfate-Mg Sulf (SUPREP BOWEL PREP KIT) 17.5-3.13-1.6 GM/177ML SOLN [id number] Take 1 kit by mouth as directed. CMontez Morita DQuillian Quince MD  Active Self  NARCAN 4 MG/0.1ML LIQD nasal spray kit [id number] Place 0.4 mg into the nose once. [provider]  Active Self  Oxycodone HCl 20 MG TABS [id number] Take 20 mg by mouth every 6 (six) hours as needed (pain). [provider] Taking Active Self           Med Note ([name]  Tue Jan 07, 2021 10:36 AM) Takes prn  Semaglutide, 1 MG/DOSE, 4 MG/3ML SOPN  [id number] Inject 1 mg as directed once a week.  Patient not taking: Reported on 01/07/2021   GNoreene Larsson NP Not Taking Active            Med Note ([name] MoleAug 16, 2022 11:39 AM) waiting for approval  Semaglutide,0.25 or 0.5MG/DOS, (OZEMPIC, 0.25 OR 0.5 MG/DOSE,) 2 MG/1.5ML SOPN [id number] Inject 0.5 mg into the skin once a week.  Patient not taking:  Reported on 01/07/2021   [name] Larsson, NP Not Taking Active            Med Note [name] Jan 07, 2021 11:39 AM) Waiting for approval   sildenafil (VIAGRA) 50 MG tablet [id number] Yes Take 1 tablet (50 mg total) by mouth daily as needed for erectile dysfunction. [name] Mayo, NP Taking Active Self           Med Note [name] Jan 07, 2021 10:37 AM) Not taking   timolol (BETIMOL) 0.5 % ophthalmic solution [id number] Yes Place 1 drop into both eyes in the morning. [provider] Taking Active Self            Patient Active Problem List   Diagnosis Date Noted   Iron deficiency anemia 12/30/2020   History of colonic polyps 12/30/2020   Unilateral primary osteoarthritis, right hip 08/13/2020   Unilateral primary osteoarthritis, left hip 08/13/2020   Muscle spasms of both lower extremities 05/23/2020   Need for immunization against influenza 03/06/2020   Primary osteoarthritis of both hips 07/10/2019   Vitamin D deficiency 04/13/2019   Morbid obesity with body mass index (BMI) of 40.0 to 44.9 in adult ([place]) 04/13/2019   Kidney disease, chronic, stage III (GFR 30-59 ml/min) ([place]) 07/20/2017   History of cerebrovascular accident 07/20/2017   Gastroesophageal reflux disease 07/20/2017   Gastric bypass status for obesity 02/11/2017   Type 2 diabetes mellitus with diabetic nephropathy ([place]) 08/03/2008   Hyperlipidemia 08/03/2008   SLEEP APNEA 08/03/2008    Conditions to be addressed/monitored: HTN, HLD, and DM  Patient Care Plan: RN Care Manager Plan Of Care     Problem  Identified: Chronic Disease Management and Care Coordination Needs for DM, HTN, HLD, CKD      Long-Range Goal: Development of Plan Of Care For Chronic Disease Management and Care Coordination Needs to Assist With Meeting Treatment Goals   Start Date: 01/07/2021  Expected End Date: 05/07/2021  Priority: High  Note:   Current Barriers:  Knowledge Deficits related to plan of care for management of HTN, HLD, and DMII  Chronic Disease Management support and education needs related to HTN, HLD, and DMII  RNCM Clinical Goal(s):  Patient will verbalize understanding of plan for management of HTN, HLD, and DMII take all medications exactly as prescribed and will call provider for medication related questions attend all scheduled medical appointments: colonoscopy appointment on 01/07/21, initial wound care appointment on 01/13/21 for assessment and treatment of nonhealing abdominal wound, primary care follow up on 02/06/21 demonstrate ongoing adherence to prescribed treatment plan for HTN, HLD, and DMII as evidenced by daily monitoring and recording of fasting CBG,  adherence to ADA/ carb modified, fat modified, no added salt diet, continue 30-40 minutes of exercise on your NuStep 5-7 days/week, adherence to prescribed medication regimen including new diabetes medication,  contacting provider for new or worsened symptoms or questions related to HTN, DM or HLD, CKD continue to work with RN Care Manager to address care management and care coordination needs related to HTN, HLD, and DMII , CKD work with pharmacist to address Medication procurement ( GLP1 receptor agonist) related to DMII through collaboration with Consulting civil engineer, provider, and care team.   Interventions: Inter-disciplinary care team collaboration (see longitudinal plan of care) Evaluation of current treatment plan related to  self management and patient's adherence to plan as established by provider   Chronic Kidney Disease (Status: New  goal.)  Last practice recorded BP readings:  BP Readings from Last 3 Encounters:  01/01/21 (!) 156/91  12/30/20 (!) 156/102  09/06/20 (!) 153/95  Most recent eGFR/CrCl:  Lab Results  Component Value Date   EGFR 55 (L) 01/02/2021    No components found for: CRCL  Assessed the patient's   understanding of chronic kidney disease    Evaluation of current treatment plan related to chronic kidney disease self management and patient's adherence to plan as established by provider      Provided education to patient re: stroke prevention, s/s of heart attack and stroke   Mailed patient written article entitled "Chronic Kidney Disease"  Reviewed prescribed diet:  ADA/ carb modified, fat modified, no added salt diet Reviewed medications with patient and discussed importance of compliance    Provided assistance with obtaining home blood pressure monitor via notifying provider of need for home BP monitor Rx and informing patient his [facility] health plan will cover the cost of the monitor and that he can obtain it at his pharmacy,  [employer] on the importance of exercise goals with target of 150 minutes per week     Advised patient, providing education and rationale, to monitor blood pressure daily and record, calling PCP for findings outside established parameters   I.e. <299/<37 Discussed complications of poorly controlled blood pressure such as heart disease, stroke, circulatory complications, vision complications, kidney impairment, sexual dysfunction    Reviewed scheduled/upcoming provider appointments including:  colonoscopy appointment on 01/07/21, initial wound care appointment on 01/13/21 for assessment and treatment of nonhealing abdominal wound, primary care follow up on 02/06/21   Discussed plans with patient for ongoing care management follow up and provided patient with direct contact information for care management team      Diabetes:  (Status: New  goal.) Lab Results  Component Value Date   HGBA1C 7.9 (H) 01/02/2021  Provided education to patient about basic DM disease process; mailed written article entitled "Diabetes Mellitus Basics" Assessed patient's understanding of A1C goal: <7%, reviewed most recent Hgb A1C results of 7.9% on 01/02/21 Assessed frequency of CBG testing and reviewed targets for pre and post meal Reviewed medications with patient and discussed importance of medication adherence;        Reviewed prescribed diet with patient: ADA/ carb modified, fat modified, no added salt diet Offered DM education refresher classes Discussed plans with patient for ongoing care management follow up and provided patient with direct contact information for care management team;      Reviewed scheduled/upcoming provider appointments including:   colonoscopy appointment on 01/07/21, initial wound care appointment on 01/13/21 for assessment and treatment of nonhealing abdominal wound, primary care follow up on 02/06/21      Advised patient, providing education and rationale, to check fasting cbg daily and record        call provider for findings outside established parameters; I.e. fasting CBG consistently >130   Referral made to pharmacy team for assistance with obtaining GLP1 Receptor Agonist and to answer any questions related to the new DM medication;       Assessed social determinant of health barriers;         Health Maintenance (Status: New goal.)  Patient interviewed about adult health maintenance status including Colonoscopy and verified understanding of bowel prep for procedure on 01/08/21   Pneumonia Vaccine- states he received PNA vaccine 4 years ago Influenza Vaccine- states he gets flu shot every year COVID vaccination   -  pt refuses at present Regular eye checkups- says he sees his eye doctor every 6 months due to glaucoma and his next appointment is in November  Diabetes Eye Exam    Blood Pressure    Hemoglobin A1c    Diabetes  Foot Exam    Advanced Directives- states he does not have and does not want information at this time           Hyperlipidemia:  (Status: New goal.) Lab Results  Component Value Date   CHOL 174 01/02/2021   HDL 37 (L) 01/02/2021   LDLCALC 91 01/02/2021   TRIG 276 (H) 01/02/2021   CHOLHDL 6.1 (H) 12/20/2019     Medication review performed; medication list updated in electronic medical record.  Provider established cholesterol goals reviewed; Counseled on importance of regular laboratory monitoring as prescribed; Provided HLD educational materials; mailed written articles entitled "High Cholesterol" and "Cholesterol Content In Foods"  Reviewed role and benefits of statin for ASCVD risk reduction; Reviewed exercise goals and target of 150 minutes per week; Assessed social determinant of health barriers;   Hypertension: (Status: New goal.) Last practice recorded BP readings:  BP Readings from Last 3 Encounters:  01/01/21 (!) 156/91  12/30/20 (!) 156/102  09/06/20 (!) 153/95  Most recent eGFR/CrCl:  Lab Results  Component Value Date   EGFR 55 (L) 01/02/2021    No components found for: CRCL  Evaluation of current treatment plan related to hypertension self management and patient's adherence to plan as established by provider;   Provided education to patient re: stroke prevention, s/s of heart attack and stroke; Provided assistance with obtaining home blood pressure monitor via verifying that [employer]   covers cost of the home BP monitor and notified provider of need for Rx for home monitor;  Counseled on the importance of exercise goals with target of 150 minutes per week Discussed plans with patient for ongoing care management follow up and provided patient with direct contact information for care management team; Reviewed scheduled/upcoming provider appointments including: colonoscopy appointment on 01/07/21, initial wound care appointment on 01/13/21 for assessment and  treatment of nonhealing abdominal wound, primary care follow up on 02/06/21      Advised patient to discuss need for Rx for home BP monitor with provider; Provided education on prescribed diet CHO modified, no added salt; heart healthy Discussed complications of poorly controlled blood pressure such as heart disease, stroke, circulatory complications, vision complications, kidney impairment, sexual dysfunction;  Assessed social determinant of health barriers;   Pain:  (Status: New goal.) Pain assessment performed Medications reviewed Reviewed provider established plan for pain management; Discussed importance of adherence to all scheduled medical appointments;colonoscopy appointment on 01/07/21, initial wound care appointment on 01/13/21 for assessment and treatment of nonhealing abdominal wound, primary care follow up on 02/06/21      Counseled on the importance of reporting any/all new or changed pain symptoms or management strategies to pain management provider; Positive reinforcement given to patient regarding ongoing weight loss to meet goal to qualify for bilateral hip surgery  Patient Goals/Self-Care Activities: Patient will self administer medications as prescribed Patient will attend all scheduled provider appointments Patient will call pharmacy for medication refills Patient will continue to perform ADL's independently Patient will continue to perform IADL's independently Patient will call provider office for new concerns or questions Patient will work with pharmacist  to assist with obtaining GLP1 receptor agonist and to answer any questions       Patient Care Plan: Medication  Coordination     Problem Identified: Health Promotion or Disease Self-Management (General Plan of Care)      Goal: Medication Coordination   Note:   Current Barriers:  Does not maintain contact with provider office Does not contact provider office for questions/concerns   Pharmacist Clinical  Goal(s):  Over the next 180 days, patient will contact provider office for questions/concerns as evidenced notation of same in electronic health record through collaboration with PharmD and provider.    Interventions: Inter-disciplinary care team collaboration (see longitudinal plan of care) Comprehensive medication review performed; medication list updated in electronic medical record      Patient Goals/Self-Care Activities Over the next 180 days, patient will:  - collaborate with provider on medication access solutions  Follow Up Plan: The patient has been provided with contact information for the care management team and has been advised to call with any health related questions or concerns.      Task: Mutually Develop and Royce Macadamia Achievement of Patient Goals   Note:   Care Management Activities:    - verbalization of feelings encouraged    Notes:      Medication Assistance: None required. Patient affirms current coverage meets needs.   Follow up: Agree   Plan: The patient has been provided with contact information for the care management team and has been advised to call with any health related questions or concerns.   [place] Constable, Pharm.D., Managed Medicaid Pharmacist - [phone]

## 2021-01-14 NOTE — Patient Instructions (Signed)
Visit Information  [name] was given information about Medicaid Managed Care team care coordination services as a part of their Healthy [facility] Medicaid benefit. [name] verbally consented to engagement with the [facility] Managed Care team.   If you are experiencing a medical emergency, please call 911 or report to your local emergency department or urgent care.   If you have a non-emergency medical problem during routine business hours, please contact your provider's office and ask to speak with a nurse.   For questions related to your Healthy [facility] health plan, please call: [phone] or visit the homepage here: [email or web address]  If you would like to schedule transportation through your Healthy [facility] plan, please call the following number at least 2 days in advance of your appointment: [phone]  Call the [facility] at [phone], at any time, 24 hours a day, 7 days a week. If you are in danger or need immediate medical attention call 911.  If you would like help to quit smoking, call 1-800-QUIT-NOW (253)768-3264) OR Espaol: 1-855-Djelo-Ya (6-270-350-0938) o para ms informacin haga clic aqu or Text READY to 200-400 to register via text  [name] - following are the goals we discussed in your visit today:   Goals Addressed   None     Please see education materials related to DM provided as print materials.   The patient verbalized understanding of instructions provided today and declined a print copy of patient instruction materials.   The  Patient                                              has been provided with contact information for the Managed Medicaid care management team and has been advised to call with any health related questions or concerns.   [name], Pharm.D., Managed Medicaid Pharmacist [phone]   Following is a copy of your plan of care:  Patient Care Plan:  RN Care Manager Plan Of Care     Problem Identified: Chronic Disease Management and Care Coordination Needs for DM, HTN, HLD, CKD      Long-Range Goal: Development of Plan Of Care For Chronic Disease Management and Care Coordination Needs to Assist With Meeting Treatment Goals   Start Date: 01/07/2021  Expected End Date: 05/07/2021  Priority: High  Note:   Current Barriers:  Knowledge Deficits related to plan of care for management of HTN, HLD, and DMII  Chronic Disease Management support and education needs related to HTN, HLD, and DMII  RNCM Clinical Goal(s):  Patient will verbalize understanding of plan for management of HTN, HLD, and DMII take all medications exactly as prescribed and will call provider for medication related questions attend all scheduled medical appointments: colonoscopy appointment on 01/07/21, initial wound care appointment on 01/13/21 for assessment and treatment of nonhealing abdominal wound, primary care follow up on 02/06/21 demonstrate ongoing adherence to prescribed treatment plan for HTN, HLD, and DMII as evidenced by daily monitoring and recording of fasting CBG,  adherence to ADA/ carb modified, fat modified, no added salt diet, continue 30-40 minutes of exercise on your NuStep 5-7 days/week, adherence to prescribed medication regimen including new diabetes medication,  contacting provider for new or worsened symptoms or questions related to HTN, DM or HLD, CKD continue to work with RN Care Manager to address care management and care coordination needs  related to HTN, HLD, and DMII , CKD work with pharmacist to address Medication procurement ( GLP1 receptor agonist) related to DMII through collaboration with RN Care manager, provider, and care team.   Interventions: Inter-disciplinary care team collaboration (see longitudinal plan of care) Evaluation of current treatment plan related to  self management and patient's adherence to plan as established by  provider   Chronic Kidney Disease (Status: New goal.)  Last practice recorded BP readings:  BP Readings from Last 3 Encounters:  01/01/21 (!) 156/91  12/30/20 (!) 156/102  09/06/20 (!) 153/95  Most recent eGFR/CrCl:  Lab Results  Component Value Date   EGFR 55 (L) 01/02/2021    No components found for: CRCL  Assessed the patient's   understanding of chronic kidney disease    Evaluation of current treatment plan related to chronic kidney disease self management and patient's adherence to plan as established by provider      Provided education to patient re: stroke prevention, s/s of heart attack and stroke   Mailed patient written article entitled "Chronic Kidney Disease"  Reviewed prescribed diet:  ADA/ carb modified, fat modified, no added salt diet Reviewed medications with patient and discussed importance of compliance    Provided assistance with obtaining home blood pressure monitor via notifying provider of need for home BP monitor Rx and informing patient his Healthy [facility] health plan will cover the cost of the monitor and that he can obtain it at his pharmacy,  [employer] on the importance of exercise goals with target of 150 minutes per week     Advised patient, providing education and rationale, to monitor blood pressure daily and record, calling PCP for findings outside established parameters   I.e. <283/<15 Discussed complications of poorly controlled blood pressure such as heart disease, stroke, circulatory complications, vision complications, kidney impairment, sexual dysfunction    Reviewed scheduled/upcoming provider appointments including:  colonoscopy appointment on 01/07/21, initial wound care appointment on 01/13/21 for assessment and treatment of nonhealing abdominal wound, primary care follow up on 02/06/21   Discussed plans with patient for ongoing care management follow up and provided patient with direct contact information for care  management team      Diabetes:  (Status: New goal.) Lab Results  Component Value Date   HGBA1C 7.9 (H) 01/02/2021  Provided education to patient about basic DM disease process; mailed written article entitled "Diabetes Mellitus Basics" Assessed patient's understanding of A1C goal: <7%, reviewed most recent Hgb A1C results of 7.9% on 01/02/21 Assessed frequency of CBG testing and reviewed targets for pre and post meal Reviewed medications with patient and discussed importance of medication adherence;        Reviewed prescribed diet with patient: ADA/ carb modified, fat modified, no added salt diet Offered DM education refresher classes Discussed plans with patient for ongoing care management follow up and provided patient with direct contact information for care management team;      Reviewed scheduled/upcoming provider appointments including:   colonoscopy appointment on 01/07/21, initial wound care appointment on 01/13/21 for assessment and treatment of nonhealing abdominal wound, primary care follow up on 02/06/21      Advised patient, providing education and rationale, to check fasting cbg daily and record        call provider for findings outside established parameters; I.e. fasting CBG consistently >130   Referral made to pharmacy team for assistance with obtaining GLP1 Receptor Agonist and to answer any questions related to the new  DM medication;       Assessed social determinant of health barriers;         Health Maintenance (Status: New goal.)  Patient interviewed about adult health maintenance status including Colonoscopy and verified understanding of bowel prep for procedure on 01/08/21   Pneumonia Vaccine- states he received PNA vaccine 4 years ago Influenza Vaccine- states he gets flu shot every year COVID vaccination   - pt refuses at present Regular eye checkups- says he sees his eye doctor every 6 months due to glaucoma and his next appointment is in November  Diabetes Eye Exam     Blood Pressure    Hemoglobin A1c    Diabetes Foot Exam    Advanced Directives- states he does not have and does not want information at this time           Hyperlipidemia:  (Status: New goal.) Lab Results  Component Value Date   CHOL 174 01/02/2021   HDL 37 (L) 01/02/2021   LDLCALC 91 01/02/2021   TRIG 276 (H) 01/02/2021   CHOLHDL 6.1 (H) 12/20/2019     Medication review performed; medication list updated in electronic medical record.  Provider established cholesterol goals reviewed; Counseled on importance of regular laboratory monitoring as prescribed; Provided HLD educational materials; mailed written articles entitled "High Cholesterol" and "Cholesterol Content In Foods"  Reviewed role and benefits of statin for ASCVD risk reduction; Reviewed exercise goals and target of 150 minutes per week; Assessed social determinant of health barriers;   Hypertension: (Status: New goal.) Last practice recorded BP readings:  BP Readings from Last 3 Encounters:  01/01/21 (!) 156/91  12/30/20 (!) 156/102  09/06/20 (!) 153/95  Most recent eGFR/CrCl:  Lab Results  Component Value Date   EGFR 55 (L) 01/02/2021    No components found for: CRCL  Evaluation of current treatment plan related to hypertension self management and patient's adherence to plan as established by provider;   Provided education to patient re: stroke prevention, s/s of heart attack and stroke; Provided assistance with obtaining home blood pressure monitor via verifying that [employer]   covers cost of the home BP monitor and notified provider of need for Rx for home monitor;  Counseled on the importance of exercise goals with target of 150 minutes per week Discussed plans with patient for ongoing care management follow up and provided patient with direct contact information for care management team; Reviewed scheduled/upcoming provider appointments including: colonoscopy appointment on 01/07/21, initial wound care  appointment on 01/13/21 for assessment and treatment of nonhealing abdominal wound, primary care follow up on 02/06/21      Advised patient to discuss need for Rx for home BP monitor with provider; Provided education on prescribed diet CHO modified, no added salt; heart healthy Discussed complications of poorly controlled blood pressure such as heart disease, stroke, circulatory complications, vision complications, kidney impairment, sexual dysfunction;  Assessed social determinant of health barriers;   Pain:  (Status: New goal.) Pain assessment performed Medications reviewed Reviewed provider established plan for pain management; Discussed importance of adherence to all scheduled medical appointments;colonoscopy appointment on 01/07/21, initial wound care appointment on 01/13/21 for assessment and treatment of nonhealing abdominal wound, primary care follow up on 02/06/21      Counseled on the importance of reporting any/all new or changed pain symptoms or management strategies to pain management provider; Positive reinforcement given to patient regarding ongoing weight loss to meet goal to qualify for bilateral hip surgery  Patient Goals/Self-Care Activities:  Patient will self administer medications as prescribed Patient will attend all scheduled provider appointments Patient will call pharmacy for medication refills Patient will continue to perform ADL's independently Patient will continue to perform IADL's independently Patient will call provider office for new concerns or questions Patient will work with pharmacist  to assist with obtaining GLP1 receptor agonist and to answer any questions       Patient Care Plan: Medication Coordination     Problem Identified: Health Promotion or Disease Self-Management (General Plan of Care)      Goal: Medication Coordination   Note:   Current Barriers:  Does not maintain contact with provider office Does not contact provider office for  questions/concerns   Pharmacist Clinical Goal(s):  Over the next 180 days, patient will contact provider office for questions/concerns as evidenced notation of same in electronic health record through collaboration with PharmD and provider.    Interventions: Inter-disciplinary care team collaboration (see longitudinal plan of care) Comprehensive medication review performed; medication list updated in electronic medical record      Patient Goals/Self-Care Activities Over the next 180 days, patient will:  - collaborate with provider on medication access solutions  Follow Up Plan: The patient has been provided with contact information for the care management team and has been advised to call with any health related questions or concerns.      Task: Mutually Develop and Royce Macadamia Achievement of Patient Goals   Note:   Care Management Activities:    - verbalization of feelings encouraged    Notes:

## 2021-01-15 NOTE — Progress Notes (Addendum)
CHALRES, VIVES ([id number]) Visit Report for 01/13/2021 Allergy List Details Patient Name: Date of Service: [name], TO [place] 01/13/2021 1:15 PM Medical Record Number: [id number] Patient Account Number: [id number] Date of Birth/Sex: Treating RN: Aug 19, 1970 (50 y.o. [name] Primary Care Kyarra Vancamp: Demetrius Revel Other Clinician: Referring Lakenya Riendeau: Treating Sayana Salley/Extender: Claybon Jabs in Treatment: 0 Allergies Active Allergies No Known Allergies Allergy Notes Electronic Signature(s) Signed: 01/13/2021 5:14:01 PM By: Baruch Gouty RN, BSN Entered By: Baruch Gouty on 01/13/2021 13:29:16 -------------------------------------------------------------------------------- Arrival Information Details Patient Name: Date of Service: [name] LA HA [place], [name] 01/13/2021 1:15 PM Medical Record Number: [id number] Patient Account Number: [id number] Date of Birth/Sex: Treating RN: 12/15/1970 (50 y.o. Marcheta Grammes Primary Care Divonte Senger: Demetrius Revel Other Clinician: Referring Daje Stark: Treating Melvina Pangelinan/Extender: Claybon Jabs in Treatment: 0 Visit Information Patient Arrived: Lyndel Pleasure Time: 13:13 Accompanied By: self Transfer Assistance: None Patient Identification Verified: Yes Secondary Verification Process Completed: Yes History Since Last Visit Added or deleted any medications: No Any new allergies or adverse reactions: No Had a fall or experienced change in activities of daily living that may affect risk of falls: No Signs or symptoms of abuse/neglect since last visito No Hospitalized since last visit: No Implantable device outside of the clinic excluding cellular tissue based products placed in the center since last visit: No Electronic Signature(s) Signed: 01/15/2021 9:12:52 AM By: Sandre Kitty Entered By: Sandre Kitty on 01/13/2021  13:19:27 -------------------------------------------------------------------------------- Clinic Level of Care Assessment Details Patient Name: Date of Service: [name] LA [place], [name] 01/13/2021 1:15 PM Medical Record Number: [id number] Patient Account Number: [id number] Date of Birth/Sex: Treating RN: Nov 06, 1970 (50 y.o. Marcheta Grammes Primary Care Eugene Zeiders: Demetrius Revel Other Clinician: Referring Brailon Don: Treating Serenity Fortner/Extender: Claybon Jabs in Treatment: 0 Clinic Level of Care Assessment Items TOOL 2 Quantity Score X- 1 0 Use when only an EandM is performed on the INITIAL visit ASSESSMENTS - Nursing Assessment / Reassessment X- 1 20 General Physical Exam (combine w/ comprehensive assessment (listed just below) when performed on new pt. evals) X- 1 25 Comprehensive Assessment (HX, ROS, Risk Assessments, Wounds Hx, etc.) ASSESSMENTS - Wound and Skin A ssessment / Reassessment X - Simple Wound Assessment / Reassessment - one wound 1 5 '[]'$  - 0 Complex Wound Assessment / Reassessment - multiple wounds '[]'$  - 0 Dermatologic / Skin Assessment (not related to wound area) ASSESSMENTS - Ostomy and/or Continence Assessment and Care '[]'$  - 0 Incontinence Assessment and Management '[]'$  - 0 Ostomy Care Assessment and Management (repouching, etc.) PROCESS - Coordination of Care '[]'$  - 0 Simple Patient / Family Education for ongoing care X- 1 20 Complex (extensive) Patient / Family Education for ongoing care X- 1 10 Staff obtains Programmer, systems, Records, T Results / Process Orders est X- 1 10 Staff telephones HHA, Nursing Homes / Clarify orders / etc '[]'$  - 0 Routine Transfer to another Facility (non-emergent condition) '[]'$  - 0 Routine Hospital Admission (non-emergent condition) '[]'$  - 0 New Admissions / Biomedical engineer / Ordering NPWT Apligraf, etc. , '[]'$  - 0 Emergency Hospital Admission (emergent condition) '[]'$  - 0 Simple Discharge Coordination '[]'$  - 0 Complex  (extensive) Discharge Coordination PROCESS - Special Needs '[]'$  - 0 Pediatric / Minor Patient Management '[]'$  - 0 Isolation Patient Management '[]'$  - 0 Hearing / Language / Visual special needs '[]'$  - 0 Assessment of Community assistance (transportation, D/C planning, etc.) '[]'$  - 0 Additional assistance / Altered mentation '[]'$  -  0 Support Surface(s) Assessment (bed, cushion, seat, etc.) INTERVENTIONS - Wound Cleansing / Measurement X- 1 5 Wound Imaging (photographs - any number of wounds) '[]'$  - 0 Wound Tracing (instead of photographs) X- 1 5 Simple Wound Measurement - one wound '[]'$  - 0 Complex Wound Measurement - multiple wounds X- 1 5 Simple Wound Cleansing - one wound '[]'$  - 0 Complex Wound Cleansing - multiple wounds INTERVENTIONS - Wound Dressings X - Small Wound Dressing one or multiple wounds 1 10 '[]'$  - 0 Medium Wound Dressing one or multiple wounds '[]'$  - 0 Large Wound Dressing one or multiple wounds '[]'$  - 0 Application of Medications - injection INTERVENTIONS - Miscellaneous '[]'$  - 0 External ear exam '[]'$  - 0 Specimen Collection (cultures, biopsies, blood, body fluids, etc.) '[]'$  - 0 Specimen(s) / Culture(s) sent or taken to Lab for analysis '[]'$  - 0 Patient Transfer (multiple staff / Civil Service fast streamer / Similar devices) '[]'$  - 0 Simple Staple / Suture removal (25 or less) '[]'$  - 0 Complex Staple / Suture removal (26 or more) '[]'$  - 0 Hypo / Hyperglycemic Management (close monitor of Blood Glucose) '[]'$  - 0 Ankle / Brachial Index (ABI) - do not check if billed separately Has the patient been seen at the hospital within the last three years: Yes Total Score: 115 Level Of Care: New/Established - Level 3 Electronic Signature(s) Signed: 01/13/2021 5:26:48 PM By: Lorrin Jackson Entered By: Lorrin Jackson on 01/13/2021 13:55:29 -------------------------------------------------------------------------------- Encounter Discharge Information Details Patient Name: Date of Service: [name] [name], TO [place]  E. 01/13/2021 1:15 PM Medical Record Number: [id number] Patient Account Number: [id number] Date of Birth/Sex: Treating RN: 12/29/1970 (50 y.o. [name] Primary Care Karisha Marlin: Demetrius Revel Other Clinician: Referring Kylen Schliep: Treating Leinani Lisbon/Extender: Claybon Jabs in Treatment: 0 Encounter Discharge Information Items Discharge Condition: Stable Ambulatory Status: Cane Discharge Destination: Home Transportation: Private Auto Accompanied By: self Schedule Follow-up Appointment: Yes Clinical Summary of Care: Patient Declined Electronic Signature(s) Signed: 01/13/2021 5:14:01 PM By: Baruch Gouty RN, BSN Entered By: Baruch Gouty on 01/13/2021 14:01:12 -------------------------------------------------------------------------------- Lower Extremity Assessment Details Patient Name: Date of Service: [name] [name], [name] 01/13/2021 1:15 PM Medical Record Number: [id number] Patient Account Number: [id number] Date of Birth/Sex: Treating RN: 15-Apr-1971 (50 y.o. [name] Primary Care Lahari Suttles: Demetrius Revel Other Clinician: Referring Odies Desa: Treating Mariya Mottley/Extender: Claybon Jabs in Treatment: 0 Electronic Signature(s) Signed: 01/13/2021 5:14:01 PM By: Baruch Gouty RN, BSN Entered By: Baruch Gouty on 01/13/2021 13:36:47 -------------------------------------------------------------------------------- Multi Wound Chart Details Patient Name: Date of Service: [name] LA HA [place], [name] 01/13/2021 1:15 PM Medical Record Number: [id number] Patient Account Number: [id number] Date of Birth/Sex: Treating RN: [birth year] (50 y.o. Marcheta Grammes Primary Care Alean Kromer: Demetrius Revel Other Clinician: Referring Taleisha Kaczynski: Treating Taylr Meuth/Extender: Claybon Jabs in Treatment: 0 Vital Signs Height(in): 68 Capillary Blood Glucose(mg/dl): 144 Weight(lbs): 265 Pulse(bpm): 88 Body Mass Index(BMI): 40 Blood  Pressure(mmHg): 160/86 Temperature(F): 98.4 Respiratory Rate(breaths/min): 18 Photos: [N/A:N/A] Right Abdomen - Lower Quadrant N/A N/A Wound Location: Shear/Friction N/A N/A Wounding Event: Trauma, Other N/A N/A Primary Etiology: Anemia, Sleep Apnea, Hypertension, N/A N/A Comorbid History: Type II Diabetes, Gout, Osteoarthritis 07/23/2020 N/A N/A Date Acquired: 0 N/A N/A Weeks of Treatment: Open N/A N/A Wound Status: 0.3x0.5x0.1 N/A N/A Measurements L x W x D (cm) 0.118 N/A N/A A (cm) : rea 0.012 N/A N/A Volume (cm) : 0.00% N/A N/A % Reduction in Area: 0.00% N/A N/A % Reduction in Volume:  Full Thickness Without Exposed N/A N/A Classification: Support Structures Small N/A N/A Exudate Amount: Serosanguineous N/A N/A Exudate Type: red, brown N/A N/A Exudate Color: Distinct, outline attached N/A N/A Wound Margin: Large (67-100%) N/A N/A Granulation Amount: Red N/A N/A Granulation Quality: None Present (0%) N/A N/A Necrotic Amount: Fat Layer (Subcutaneous Tissue): Yes N/A N/A Exposed Structures: Fascia: No Tendon: No Muscle: No Joint: No Bone: No Medium (34-66%) N/A N/A Epithelialization: Treatment Notes Wound #3 (Abdomen - Lower Quadrant) Wound Laterality: Right Cleanser Soap and Water Discharge Instruction: May shower and wash wound with dial antibacterial soap and water prior to dressing change. Peri-Wound Care Topical Coloplast Cream Primary Dressing Secondary Dressing Zetuvit Plus Silicone Border Dressing 4x4 (in/in) Discharge Instruction: Apply silicone border over primary dressing as directed. Secured With Compression Wrap Compression Stockings Environmental education officer) Signed: 01/13/2021 2:26:32 PM By: Kalman Shan DO Signed: 01/13/2021 5:26:48 PM By: Lorrin Jackson Entered By: Kalman Shan on 01/13/2021 14:20:28 -------------------------------------------------------------------------------- Multi-Disciplinary Care Plan  Details Patient Name: Date of Service: [name] LA HA [place], [name] 01/13/2021 1:15 PM Medical Record Number: [id number] Patient Account Number: [id number] Date of Birth/Sex: Treating RN: 1970/10/23 (50 y.o. Marcheta Grammes Primary Care Ashad Fawbush: Demetrius Revel Other Clinician: Referring Charmel Pronovost: Treating Francely Craw/Extender: Claybon Jabs in Treatment: 0 Active Inactive Electronic Signature(s) Signed: 02/20/2021 4:34:47 PM By: Lorrin Jackson Previous Signature: 01/13/2021 5:26:48 PM Version By: Lorrin Jackson Entered By: Lorrin Jackson on 02/20/2021 16:34:47 -------------------------------------------------------------------------------- Pain Assessment Details Patient Name: Date of Service: [name] [name], [name] 01/13/2021 1:15 PM Medical Record Number: [id number] Patient Account Number: [id number] Date of Birth/Sex: Treating RN: 1971-04-04 (50 y.o. [name] Primary Care Laressa Bolinger: Demetrius Revel Other Clinician: Referring Maxie Slovacek: Treating Leea Rambeau/Extender: Claybon Jabs in Treatment: 0 Active Problems Location of Pain Severity and Description of Pain Patient Has Paino No Site Locations Rate the pain. Rate the pain. Current Pain Level: 0 Pain Management and Medication Current Pain Management: Electronic Signature(s) Signed: 01/13/2021 5:14:01 PM By: Baruch Gouty RN, BSN Entered By: Baruch Gouty on 01/13/2021 13:39:31 -------------------------------------------------------------------------------- Wound Assessment Details Patient Name: Date of Service: [name] [name], [name] 01/13/2021 1:15 PM Medical Record Number: [id number] Patient Account Number: [id number] Date of Birth/Sex: Treating RN: 04/13/1971 (50 y.o. Marcheta Grammes Primary Care Jaymes Revels: Demetrius Revel Other Clinician: Referring Ladarian Bonczek: Treating Marion Rosenberry/Extender: Claybon Jabs in Treatment: 0 Wound Status Wound Number: 3 Primary  Trauma, Other Etiology: Wound Location: Right Abdomen - Lower Quadrant Wound Status: Healed - Epithelialized Wounding Event: Shear/Friction Comorbid Anemia, Sleep Apnea, Hypertension, Type II Diabetes, Gout, Date Acquired: 07/23/2020 History: Osteoarthritis Weeks Of Treatment: 0 Clustered Wound: No Photos Wound Measurements Length: (cm) Width: (cm) Depth: (cm) Area: (cm) Volume: (cm) 0 % Reduction in Area: 0 % Reduction in Volume: 0 0 0 Wound Description Classification: Full Thickness Without Exposed Support Structur Assessment Notes patient called, wound healed 02/20/21 es Treatment Notes Wound #3 (Abdomen - Lower Quadrant) Wound Laterality: Right Cleanser Soap and Water Discharge Instruction: May shower and wash wound with dial antibacterial soap and water prior to dressing change. Peri-Wound Care Topical Coloplast Cream Primary Dressing Secondary Dressing Zetuvit Plus Silicone Border Dressing 4x4 (in/in) Discharge Instruction: Apply silicone border over primary dressing as directed. Secured With Compression Wrap Compression Stockings Environmental education officer) Signed: 02/20/2021 4:34:17 PM By: Lorrin Jackson Previous Signature: 01/13/2021 5:14:01 PM Version By: Baruch Gouty RN, BSN Previous Signature: 01/13/2021 5:26:48 PM Version By: Lorrin Jackson Entered By: Onnie Boer  Jodi on 02/20/2021 16:34:16 -------------------------------------------------------------------------------- Vitals Details Patient Name: Date of Service: [name], [name] 01/13/2021 1:15 PM Medical Record Number: [id number] Patient Account Number: [id number] Date of Birth/Sex: Treating RN: Mar 09, 1971 (50 y.o. Marcheta Grammes Primary Care Marleni Gallardo: Demetrius Revel Other Clinician: Referring Kida Digiulio: Treating Imane Burrough/Extender: Claybon Jabs in Treatment: 0 Vital Signs Time Taken: 13:19 Temperature (F): 98.4 Height (in): 68 Pulse (bpm): 88 Source:  Stated Respiratory Rate (breaths/min): 18 Weight (lbs): 265 Blood Pressure (mmHg): 160/86 Source: Stated Capillary Blood Glucose (mg/dl): 144 Body Mass Index (BMI): 40.3 Reference Range: 80 - 120 mg / dl Notes glucose per pt report this am Electronic Signature(s) Signed: 01/13/2021 5:14:01 PM By: Baruch Gouty RN, BSN Entered By: Baruch Gouty on 01/13/2021 13:28:49

## 2021-01-17 DIAGNOSIS — N170 Acute kidney failure with tubular necrosis: Secondary | ICD-10-CM | POA: Diagnosis not present

## 2021-01-17 DIAGNOSIS — I5032 Chronic diastolic (congestive) heart failure: Secondary | ICD-10-CM | POA: Diagnosis not present

## 2021-01-17 DIAGNOSIS — R809 Proteinuria, unspecified: Secondary | ICD-10-CM | POA: Diagnosis not present

## 2021-01-17 DIAGNOSIS — E1129 Type 2 diabetes mellitus with other diabetic kidney complication: Secondary | ICD-10-CM | POA: Diagnosis not present

## 2021-01-17 DIAGNOSIS — N189 Chronic kidney disease, unspecified: Secondary | ICD-10-CM | POA: Diagnosis not present

## 2021-01-17 DIAGNOSIS — E1122 Type 2 diabetes mellitus with diabetic chronic kidney disease: Secondary | ICD-10-CM | POA: Diagnosis not present

## 2021-01-20 DIAGNOSIS — M1611 Unilateral primary osteoarthritis, right hip: Secondary | ICD-10-CM | POA: Diagnosis not present

## 2021-01-23 DIAGNOSIS — E1122 Type 2 diabetes mellitus with diabetic chronic kidney disease: Secondary | ICD-10-CM | POA: Diagnosis not present

## 2021-01-23 DIAGNOSIS — E1129 Type 2 diabetes mellitus with other diabetic kidney complication: Secondary | ICD-10-CM | POA: Diagnosis not present

## 2021-01-23 DIAGNOSIS — I5032 Chronic diastolic (congestive) heart failure: Secondary | ICD-10-CM | POA: Diagnosis not present

## 2021-01-23 DIAGNOSIS — I129 Hypertensive chronic kidney disease with stage 1 through stage 4 chronic kidney disease, or unspecified chronic kidney disease: Secondary | ICD-10-CM | POA: Diagnosis not present

## 2021-01-23 DIAGNOSIS — R809 Proteinuria, unspecified: Secondary | ICD-10-CM | POA: Diagnosis not present

## 2021-01-23 DIAGNOSIS — N189 Chronic kidney disease, unspecified: Secondary | ICD-10-CM | POA: Diagnosis not present

## 2021-01-28 ENCOUNTER — Encounter (HOSPITAL_BASED_OUTPATIENT_CLINIC_OR_DEPARTMENT_OTHER): Payer: Medicaid Other | Attending: Internal Medicine | Admitting: Internal Medicine

## 2021-02-03 ENCOUNTER — Ambulatory Visit: Payer: Self-pay

## 2021-02-03 ENCOUNTER — Other Ambulatory Visit: Payer: Self-pay | Admitting: *Deleted

## 2021-02-03 NOTE — Patient Outreach (Signed)
Care Coordination  02/03/2021  PRATHIK MAGOUIRK [birth year] [id number]  02/03/2021 Name: [name] MRN: [id number] DOB: 12-27-1970  Referred by: [name], NP Reason for referral : High Risk Managed Medicaid (Unsuccessful RNCM follow up telephone outreach)   An unsuccessful telephone outreach was attempted today. The patient was referred to the case management team for assistance with care management and care coordination.    Follow Up Plan: A HIPAA compliant phone message was left for the patient providing contact information and requesting a return call. and The Managed Medicaid care management team will reach out to the patient again over the next 7 days.    Kelli Churn RN, CCM, [facility] Care Management Coordinator - Managed [place] High Risk [phone]

## 2021-02-03 NOTE — Patient Instructions (Signed)
[name] ,   The [facility] Managed Care Team is available to provide assistance to you with your healthcare needs at no cost and as a benefit of your [facility] plan. I'm sorry I was unable to reach you today for our scheduled appointment. Our care guide will call you to reschedule our telephone appointment. Please call me at the number below. I am available to be of assistance to you regarding your healthcare needs. .   Thank you,   Kelli Churn RN, CCM, [facility] Care Management Coordinator - Managed [place] High Risk [phone]

## 2021-02-04 ENCOUNTER — Other Ambulatory Visit: Payer: Self-pay | Admitting: Nurse Practitioner

## 2021-02-06 ENCOUNTER — Encounter: Payer: Self-pay | Admitting: Nurse Practitioner

## 2021-02-06 ENCOUNTER — Ambulatory Visit: Payer: Medicaid Other | Admitting: Nurse Practitioner

## 2021-02-06 ENCOUNTER — Other Ambulatory Visit: Payer: Self-pay

## 2021-02-06 VITALS — BP 158/84 | HR 97 | Temp 98.5°F | Ht 68.0 in | Wt 279.0 lb

## 2021-02-06 DIAGNOSIS — Z23 Encounter for immunization: Secondary | ICD-10-CM | POA: Insufficient documentation

## 2021-02-06 DIAGNOSIS — D509 Iron deficiency anemia, unspecified: Secondary | ICD-10-CM | POA: Diagnosis not present

## 2021-02-06 DIAGNOSIS — E785 Hyperlipidemia, unspecified: Secondary | ICD-10-CM | POA: Diagnosis not present

## 2021-02-06 DIAGNOSIS — E1121 Type 2 diabetes mellitus with diabetic nephropathy: Secondary | ICD-10-CM | POA: Diagnosis not present

## 2021-02-06 NOTE — Patient Instructions (Signed)
Please have fasting labs drawn 2-3 days prior to your appointment so we can discuss the results during your office visit.  Flu and shingrix shots today.

## 2021-02-06 NOTE — Assessment & Plan Note (Addendum)
-  recheck A1c in 2 months -may titrate dose of Ozempic at that time

## 2021-02-06 NOTE — Assessment & Plan Note (Signed)
-  flu shot today -he states the shingrix shot is covered by his insurance, and he would like that today as well; 2nd dose in 2 months

## 2021-02-06 NOTE — Assessment & Plan Note (Signed)
-  increased atorvastatin at last OV -recheck in 2 months

## 2021-02-06 NOTE — Progress Notes (Signed)
Acute Office Visit  Subjective:    Patient ID: [name], male    DOB: Oct 07, 1970, 50 y.o.   MRN: [id number]  Chief Complaint  Patient presents with   Follow-up    Follow up wants shingles vaccine    HPI Patient is in today for follow-up.  He states home blood sugar runs from 135-160, and is sometimes higher. He states he has been eating sugar-free food. He is taking ozempic.    Past Medical History:  Diagnosis Date   ALLERGY 08/03/2008   Qualifier: Diagnosis of  By: Christ Kick     Anemia    Arthritis    "hips" (01/15/2015)   Blood transfusion without reported diagnosis    Cellulitis 07/25/2018   Chronic back pain greater than 3 months duration 02/11/2017   Chronic lower back pain    Colon polyps    COLONIC POLYPS, ADENOMATOUS 08/03/2008   Qualifier: Diagnosis of  By: Christ Kick     CVA 08/03/2008   Qualifier: History of  By: Christ Kick     Depression    denies   Diabetes mellitus    diet controlled- no meds since wt loss   Diabetes mellitus ([place]) 07/10/2019   Dysrhythmia    s/p surgery bariatric- cardioversion   Erectile dysfunction 01/26/2018   GERD (gastroesophageal reflux disease)    HIATAL HERNIA 08/03/2008   Qualifier: Diagnosis of  By: Christ Kick     Hip pain    History of gout    Hyperlipidemia    Hypertension    Joint pain    Narcotic dependence (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 ([place]) 08/01/2018    Past Surgical History:  Procedure Laterality Date   BARIATRIC SURGERY  2010   in [place] N/A 08/02/2014   Procedure: COLONOSCOPY;  Surgeon: Rogene Houston, MD;  Location: [facility];   Service: Endoscopy;  Laterality: N/A;  210 - moved to 3/10 @ 11:55 - Ann notified pt   ESOPHAGOGASTRODUODENOSCOPY     HERNIA REPAIR     LIPOSUCTION     PANNICULECTOMY  01/14/2015   PANNICULECTOMY N/A 01/14/2015   Procedure:  TOTAL PANNICULECTOMY WTH LIPOSUCTION OF SIDES;  Surgeon: Cristine Polio, MD;  Location: [facility];  Service: Plastics;  Laterality: N/A;   TONSILLECTOMY     VENTRAL HERNIA REPAIR  01/14/2015   VENTRAL HERNIA REPAIR N/A 01/14/2015   Procedure: HERNIA REPAIR VENTRAL ADULT AND MUSCLE REPAIR;  Surgeon: Cristine Polio, MD;  Location: [facility];  Service: Plastics;  Laterality: N/A;    Family History  Problem Relation Age of Onset   COPD Mother    Depression Mother    Hyperlipidemia Mother    Hypertension Mother    Heart disease Mother    Thyroid disease Mother    Anxiety disorder Mother    Diabetes Father    Pulmonary embolism Father 70       blood clot   Hyperlipidemia Father    Sudden death Father    Obesity Father    Eating disorder Father    Hypertension Brother  Social History   Socioeconomic History   Marital status: Single    Spouse name: Not on file   Number of children: 0   Years of education: 12   Highest education level: Not on file  Occupational History   Not on file  Tobacco Use   Smoking status: Never   Smokeless tobacco: Never  Vaping Use   Vaping Use: Never used  Substance and Sexual Activity   Alcohol use: No   Drug use: No   Sexual activity: Yes    Birth control/protection: Condom  Other Topics Concern   Not on file  Social History Narrative   Lives alone   Cat-Mr [place]      Enjoy races, eat out, watch movies, listen music, reads some      Diet: Snacks a lot, tried to avoid red meat, does eat chicken and [place], eats a lot of carbs and breads (chips), and sweets   Caffeine: drinks a can monster daily   Water: 3-4 bottles 16.9 oz      Wear seat belt   Wear sunscreen   Smoke and carbon monoxide detectors    Reports not  using phone when driving   Social Determinants of Health   Financial Resource Strain: Low Risk    Difficulty of Paying Living Expenses: Not hard at all  Food Insecurity: No Food Insecurity   Worried About Charity fundraiser in the Last Year: Never true   [place] in the Last Year: Never true  Transportation Needs: No Transportation Needs   Lack of Transportation (Medical): No   Lack of Transportation (Non-Medical): No  Physical Activity: Sufficiently Active   Days of Exercise per Week: 7 days   Minutes of Exercise per Session: 40 min  Stress: No Stress Concern Present   Feeling of Stress : Only a little  Social Connections: Unknown   Frequency of Communication with Friends and Family: Not on file   Frequency of Social Gatherings with Friends and Family: Not on file   Attends Religious Services: Never   Marine scientist or Organizations: No   Attends Archivist Meetings: Never   Marital Status: Not on file  Intimate Partner Violence: Not on file    Outpatient Medications Prior to Visit  Medication Sig Dispense Refill   acetaminophen (TYLENOL) 500 MG tablet Take 1,000 mg by mouth every 6 (six) hours as needed for moderate pain.     atorvastatin (LIPITOR) 40 MG tablet Take 1 tablet (40 mg total) by mouth daily. 90 tablet 3   blood glucose meter kit and supplies KIT Test daily Dx  r73.03 1 each 0   Blood Pressure Monitor KIT 1 each by Does not apply route daily. 1 kit 0   cyclobenzaprine (FLEXERIL) 10 MG tablet Take 1 tablet (10 mg total) by mouth 2 (two) times daily as needed for muscle spasms. 60 tablet 0   ferrous sulfate 324 MG TBEC Take 324 mg by mouth every other day.     glucose blood test strip Use as instructed. Can check up to 4 times daily. 100 each 3   latanoprost (XALATAN) 0.005 % ophthalmic solution Place 1 drop into both eyes at bedtime.     Multiple Vitamin (MULTIVITAMIN WITH MINERALS) TABS tablet Take 1 tablet by mouth daily.     NARCAN 4  MG/0.1ML LIQD nasal spray kit Place 0.4 mg into the nose once.     Oxycodone HCl 20 MG TABS Take 20  mg by mouth every 6 (six) hours as needed (pain).     OZEMPIC, 0.25 OR 0.5 MG/DOSE, 2 MG/1.5ML SOPN INJECT (0.5)MG INTO THE SKIN ONCE WEEKLY 1.5 mL 0   Semaglutide, 1 MG/DOSE, 4 MG/3ML SOPN Inject 1 mg as directed once a week. 3 mL 2   timolol (BETIMOL) 0.5 % ophthalmic solution Place 1 drop into both eyes in the morning.     methylcellulose oral powder Take 1 packet by mouth daily. (Patient not taking: Reported on 02/06/2021)     Na Sulfate-K Sulfate-Mg Sulf (SUPREP BOWEL PREP KIT) 17.5-3.13-1.6 GM/177ML SOLN Take 1 kit by mouth as directed. (Patient not taking: Reported on 02/06/2021) 354 mL 0   sildenafil (VIAGRA) 50 MG tablet Take 1 tablet (50 mg total) by mouth daily as needed for erectile dysfunction. (Patient not taking: Reported on 02/06/2021) 20 tablet 0   No facility-administered medications prior to visit.    No Known Allergies  Review of Systems  Constitutional: Negative.   Respiratory: Negative.    Cardiovascular: Negative.   Musculoskeletal: Negative.   Psychiatric/Behavioral: Negative.        Objective:    Physical Exam Constitutional:      Appearance: Normal appearance.  Cardiovascular:     Rate and Rhythm: Normal rate and regular rhythm.     Pulses: Normal pulses.     Heart sounds: Normal heart sounds.  Pulmonary:     Effort: Pulmonary effort is normal.     Breath sounds: Normal breath sounds.  Musculoskeletal:        General: Normal range of motion.  Neurological:     Mental Status: He is alert.  Psychiatric:        Mood and Affect: Mood normal.        Behavior: Behavior normal.        Thought Content: Thought content normal.        Judgment: Judgment normal.    BP (!) 158/84 (BP Location: Right Arm, Patient Position: Sitting, Cuff Size: Large)   Pulse 97   Temp 98.5 F (36.9 C) (Oral)   Ht '5\' 8"'  (1.727 m)   Wt 279 lb 0.6 oz (126.6 kg)   SpO2 96%   BMI  42.43 kg/m  Wt Readings from Last 3 Encounters:  02/06/21 279 lb 0.6 oz (126.6 kg)  01/01/21 284 lb (128.8 kg)  12/30/20 284 lb (128.8 kg)    Health Maintenance Due  Topic Date Due   Pneumococcal Vaccine 28-89 Years old (2 - PCV) 02/22/2010   INFLUENZA VACCINE  12/23/2020    There are no preventive care reminders to display for this patient.   Lab Results  Component Value Date   TSH 1.81 04/13/2019   Lab Results  Component Value Date   WBC 9.4 01/02/2021   HGB 15.9 01/02/2021   HCT 47.7 01/02/2021   MCV 92 01/02/2021   PLT 207 01/02/2021   Lab Results  Component Value Date   NA 135 01/02/2021   K 4.6 01/02/2021   CO2 24 01/02/2021   GLUCOSE 165 (H) 01/02/2021   BUN 18 01/02/2021   CREATININE 1.52 (H) 01/02/2021   BILITOT 1.1 01/02/2021   ALKPHOS 104 01/02/2021   AST 23 01/02/2021   ALT 18 01/02/2021   PROT 6.9 01/02/2021   ALBUMIN 4.2 01/02/2021   CALCIUM 10.0 01/02/2021   ANIONGAP 10 07/25/2018   EGFR 55 (L) 01/02/2021   Lab Results  Component Value Date   CHOL 174 01/02/2021   Lab Results  Component Value Date   HDL 37 (L) 01/02/2021   Lab Results  Component Value Date   LDLCALC 91 01/02/2021   Lab Results  Component Value Date   TRIG 276 (H) 01/02/2021   Lab Results  Component Value Date   CHOLHDL 6.1 (H) 12/20/2019   Lab Results  Component Value Date   HGBA1C 7.9 (H) 01/02/2021       Assessment & Plan:   Problem List Items Addressed This Visit       Endocrine   Type 2 diabetes mellitus with diabetic nephropathy ([place]) - Primary    -recheck A1c in 2 months -may titrate dose of Ozempic at that time      Relevant Orders   CBC with Differential/Platelet   CMP14+EGFR   Lipid Panel With LDL/HDL Ratio   Hemoglobin A1c     Other   Hyperlipidemia    -increased atorvastatin at last OV -recheck in 2 months      Relevant Orders   CMP14+EGFR   Lipid Panel With LDL/HDL Ratio   Iron deficiency anemia   Relevant Orders   CBC with  Differential/Platelet   Iron, TIBC and Ferritin Panel   Immunization due    -flu shot today -he states the shingrix shot is covered by his insurance, and he would like that today as well; 2nd dose in 2 months        No orders of the defined types were placed in this encounter.    [name], NP

## 2021-02-06 NOTE — Addendum Note (Signed)
Addended by: Quentin Angst on: 02/06/2021 11:15 AM   Modules accepted: Orders

## 2021-02-10 DIAGNOSIS — E1129 Type 2 diabetes mellitus with other diabetic kidney complication: Secondary | ICD-10-CM | POA: Diagnosis not present

## 2021-02-10 DIAGNOSIS — I5032 Chronic diastolic (congestive) heart failure: Secondary | ICD-10-CM | POA: Diagnosis not present

## 2021-02-10 DIAGNOSIS — I129 Hypertensive chronic kidney disease with stage 1 through stage 4 chronic kidney disease, or unspecified chronic kidney disease: Secondary | ICD-10-CM | POA: Diagnosis not present

## 2021-02-10 DIAGNOSIS — N189 Chronic kidney disease, unspecified: Secondary | ICD-10-CM | POA: Diagnosis not present

## 2021-02-10 DIAGNOSIS — E1122 Type 2 diabetes mellitus with diabetic chronic kidney disease: Secondary | ICD-10-CM | POA: Diagnosis not present

## 2021-02-10 DIAGNOSIS — R809 Proteinuria, unspecified: Secondary | ICD-10-CM | POA: Diagnosis not present

## 2021-02-11 ENCOUNTER — Other Ambulatory Visit: Payer: Self-pay | Admitting: *Deleted

## 2021-02-11 NOTE — Patient Outreach (Signed)
Medicaid Managed Care   Nurse Care Manager Note  02/11/2021 Name:  [name] MRN:  [id number] DOB:  [birth year]  [name] is an 50 y.o. year old male who is a primary patient of [name] Larsson, NP.  The [facility] Managed Care Coordination team was consulted for assistance with:    HTN HLD DMI CKD Stage 3 Obesity  [name] was given information about Medicaid Managed Care Coordination team services today. [name] Patient agreed to services and verbal consent obtained.  Engaged with patient by telephone for follow up visit in response to provider referral for case management and/or care coordination services.   Assessments/Interventions:  Review of past medical history, allergies, medications, health status, including review of consultants reports, laboratory and other test data, was performed as part of comprehensive evaluation and provision of chronic care management services.  SDOH (Social Determinants of Health) assessments and interventions performed:   Care Plan  No Known Allergies  Medications Reviewed Today     Reviewed by [name] Ellison, RN (Registered Nurse) on 02/11/21 at 1343  Med List Status: <None>   Medication Order Taking? Sig Documenting Provider Last Dose Status Informant  acetaminophen (TYLENOL) 500 MG tablet [id number]  Take 1,000 mg by mouth every 6 (six) hours as needed for moderate pain. [provider]  Active Self  atorvastatin (LIPITOR) 40 MG tablet [id number]  Take 1 tablet (40 mg total) by mouth daily. [name] Larsson, NP  Active   blood glucose meter kit and supplies KIT [id number]  Test daily Dx  r73.03 [name] Deutscher, DO  Active Self  Blood Pressure Monitor KIT [id number] Yes 1 each by Does not apply route daily. [name] Larsson, NP Taking Active   cyclobenzaprine (FLEXERIL) 10 MG tablet [id number]  Take 1 tablet (10 mg total) by mouth 2 (two) times daily as needed for muscle spasms. [name] Mayo, NP  Active Self           Med  Note [name], [name]   Tue Jan 07, 2021 10:32 AM) [name] 2 in the morning and 1 at night  ferrous sulfate 324 MG TBEC [id number]  Take 324 mg by mouth every other day. [provider]  Active   glucose blood test strip [id number]  Use as instructed. Can check up to 4 times daily. [name] Larsson, NP  Active   latanoprost (XALATAN) 0.005 % ophthalmic solution [id number]  Place 1 drop into both eyes at bedtime. [provider]  Active Self  losartan (COZAAR) 25 MG tablet [id number] Yes Take by mouth. [provider]  Active   methylcellulose oral powder [id number]  Take 1 packet by mouth daily.  Patient not taking: Reported on 02/06/2021   [provider]  Active Self  Multiple Vitamin (MULTIVITAMIN WITH MINERALS) TABS tablet [id number]  Take 1 tablet by mouth daily. [provider]  Active Self  Na Sulfate-K Sulfate-Mg Sulf (SUPREP BOWEL PREP KIT) 17.5-3.13-1.6 GM/177ML SOLN [id number]  Take 1 kit by mouth as directed.  Patient not taking: Reported on 02/06/2021   [name] Quale, MD  Active Self  NARCAN 4 MG/0.1ML LIQD nasal spray kit [id number]  Place 0.4 mg into the nose once. [provider]  Active Self  Oxycodone HCl 20 MG TABS [id number]  Take 20 mg by mouth every 6 (six) hours as needed (pain). [provider]  Active Self           Med Note ([name]  Tue Jan 07, 2021 10:36 AM) Takes prn  OZEMPIC, 0.25 OR 0.5 MG/DOSE, 2 MG/1.5ML SOPN [id number]  INJECT (0.5)MG INTO THE SKIN ONCE WEEKLY [name] Larsson, NP  Active   Semaglutide, 1 MG/DOSE, 4 MG/3ML SOPN [id number]  Inject 1 mg as directed once a week. [name] Larsson, NP  Active            Med Note [name], [name]   Tue Jan 07, 2021 11:39 AM) waiting for approval  sildenafil (VIAGRA) 50 MG tablet [id number]  Take 1 tablet (50 mg total) by mouth daily as needed for erectile dysfunction.  Patient not taking: Reported on 02/06/2021   [name] Mayo, NP  Active Self            Med Note [name], [name]   Tue Jan 07, 2021 10:37 AM) Not taking   timolol (BETIMOL) 0.5 % ophthalmic solution [id number]  Place 1 drop into both eyes in the morning. [provider]  Active Self            Patient Active Problem List   Diagnosis Date Noted   Immunization due 02/06/2021   Iron deficiency anemia 12/30/2020   History of colonic polyps 12/30/2020   Unilateral primary osteoarthritis, right hip 08/13/2020   Unilateral primary osteoarthritis, left hip 08/13/2020   Muscle spasms of both lower extremities 05/23/2020   Need for immunization against influenza 03/06/2020   Primary osteoarthritis of both hips 07/10/2019   Bilateral primary osteoarthritis of hip 07/10/2019   Vitamin D deficiency 04/13/2019   Morbid obesity with body mass index (BMI) of 40.0 to 44.9 in adult ([place]) 04/13/2019   Depressive disorder 04/13/2019   Body mass index (BMI) 45.0-49.9, adult ([place]) 04/13/2019   Kidney disease, chronic, stage III (GFR 30-59 ml/min) ([place]) 07/20/2017   History of cerebrovascular accident 07/20/2017   Gastroesophageal reflux disease 07/20/2017   Chronic kidney disease, stage 3 unspecified ([place]) 07/20/2017   Gastric bypass status for obesity 02/11/2017   Benzodiazepine dependence ([place]) 02/11/2017   Type 2 diabetes mellitus with diabetic nephropathy ([place]) 08/03/2008   Hyperlipidemia 08/03/2008   SLEEP APNEA 08/03/2008   Sleep apnea 08/03/2008    Conditions to be addressed/monitored per PCP order:   HTN HLD DMI CKD Stage 3 Obesity  Care Plan : [place]  Updates made by [name] Ellison, RN since 02/11/2021 12:00 AM     Problem: Chronic Disease Management and Care Coordination Needs for DM, HTN, HLD, CKD      Long-Range Goal: Development of Plan Of Care For Chronic Disease Management and Care Coordination Needs to Assist With Meeting Treatment Goals   Start Date: 01/07/2021  Expected End Date: 05/07/2021  Priority: High  Note:    Current Barriers:  Knowledge Deficits related to plan of care for management of HTN, HLD, and DMII  Chronic Disease Management support and education needs related to HTN, HLD, and DMII  RNCM Clinical Goal([name]):  Patient will verbalize understanding of plan for management of HTN, HLD, and DMII take all medications exactly as prescribed and will call provider for medication related questions attend all scheduled medical appointments: colonoscopy appointment on 02/26/21, orthopedic appointment on 03/19/21 for left hip injection, primary care follow up on 04/09/21 demonstrate ongoing adherence to prescribed treatment plan for HTN, HLD, and DMII as evidenced by daily monitoring and recording of fasting CBG,  adherence to ADA/ carb modified, fat modified, no added salt diet, continue 30-40 minutes of exercise on your  NuStep 5-7 days/week, adherence to prescribed medication regimen including new diabetes medication,  contacting provider for new or worsened symptoms or questions related to HTN, DM or HLD, CKD continue to work with RN Care Manager to address care management and care coordination needs related to HTN, HLD, and DMII , CKD work with pharmacist to address Medication procurement ( GLP1 receptor agonist) related to DMII through collaboration with Consulting civil engineer, provider, and care team.  Work with [place] to address concerns related to Section 8 housing  Interventions: Inter-disciplinary care team collaboration (see longitudinal plan of care) Referred to BSW per patient request to assist with Section 8 housing Evaluation of current treatment plan related to  self management and patient'[name] adherence to plan as established by provider   Chronic Kidney Disease (Status: Goal on track: NO.) - majority of provider office readings are not meeting treatment targets; patient attributes this to pain Last practice recorded BP readings:  BP Readings from Last 3 Encounters:  02/06/21 (!) 158/84  01/01/21 (!)  156/91  12/30/20 (!) 156/102    Most recent eGFR/CrCl:  Lab Results  Component Value Date   EGFR 55 (L) 01/02/2021    No components found for: CRCL  Verified patient received health education information by mail and provided patient with opportunity to ask questions related to educational articles mailed to him on 01/07/21 related to  "Chronic Kidney Disease" , ADA/ carb modified, fat modified, no added salt diet, HTN, Chronic pain, DM, and HLD Reviewed medications with patient and discussed importance of compliance  ; advised him to increase atorvastatin got 40 mg daily  Verified patient obtained home blood pressure monitor and assessed frequency of self monitored BP, reviewed home and provider office readings, reviewed treatment targets and reviewed lifestyle strategies to assist with improving blood pressure control Counseled on the importance of continuing daily home exercise with Nu Step and reviewed exercise goals with target of 150 minutes per week     Reviewed scheduled/upcoming provider appointments including:  colonoscopy appointment on 02/26/21, orthopedic appointment on 03/19/21 for left hip injection, primary care follow up on 04/09/21  Discussed plans with patient for ongoing care management follow up and provided patient with direct contact information for care management team      Diabetes:  (Status: New goal. Goal on track: NO.)- patient reports self monitored fasting blood sugars >130 but <150 Lab Results  Component Value Date   HGBA1C 7.9 (H) 01/02/2021  Confirmed patient received DM education in mail; provided opportunity for questions Assessed frequency of CBG testing and reviewed fasting target Reviewed medications with patient and discussed importance of medication adherence; - discussed mechanism of action of Ozempic and assessed for adverse side effects       Reviewed typical food intake and congratulated patient on consuming only water to drink, eating CHO modified  breakfast, lunch and light dinner Discussed plans with patient for ongoing care management follow up and provided patient with direct contact information for care management team;      Reviewed scheduled/upcoming provider appointments including:  colonoscopy appointment on 02/26/21, orthopedic appointment on 03/19/21 for left hip injection, primary care follow up on 04/09/21           Assessed social determinant of health barriers;         Health Maintenance (Status: Goal on track: YES.)  Patient interviewed about adult health maintenance status including Colonoscopy and verified understanding of bowel prep for procedure on 02/26/21   Pneumonia Vaccine- states he received  PNA vaccine 4 years ago Influenza Vaccine- states he received flu shot on 02/06/21 COVID vaccination   - pt states he will receive at next primary care appointment on 04/09/21  Shingles Vaccination- received first dose on 02/06/21 Regular eye checkups- says he sees his eye doctor every 6 months due to glaucoma and his next appointment is in November  Advanced Directives- states he does not have and does not want information at this time           Hyperlipidemia:  (Status: will have lipid panel rechecked in 2 months (Nov) and statin dose increased to high intensity) Lab Results  Component Value Date   CHOL 174 01/02/2021   HDL 37 (L) 01/02/2021   LDLCALC 91 01/02/2021   TRIG 276 (H) 01/02/2021   CHOLHDL 6.1 (H) 12/20/2019     Medication review performed; medication list updated in electronic medical record.  Provider established cholesterol goals reviewed; Counseled on importance of regular laboratory monitoring as prescribed; Confirmed patient received HLD educational material "High Cholesterol" and "Cholesterol Content In Foods" in mail, provided opportunity for questions Reviewed role and benefits of statin for ASCVD risk reduction; Reviewed exercise goals and target of 150 minutes per week; Assessed social  determinant of health barriers  Hypertension: (Status: Goal on track: NO.)-majority of provider office readings are not meeting treatment targets; patient attributes this to pain, patient is now self monitoring blood pressure at home Last practice recorded BP readings:  BP Readings from Last 3 Encounters:  02/06/21 (!) 158/84  01/01/21 (!) 156/91  12/30/20 (!) 156/102    Most recent eGFR/CrCl:  Lab Results  Component Value Date   EGFR 55 (L) 01/02/2021    No components found for: CRCL  Evaluation of current treatment plan related to hypertension self management and patient'[name] adherence to plan as established by provider;   Reviewed BP medications and assessed medication taking behavior ensuring he is taking Losartan that was recently added to his regimen Provided education to patient re: stroke prevention, [name]/[name] of heart attack and stroke; Ensured patient obtained home blood pressure monitor  Assessed frequency of self monitored blood press and readings, reviewed treatment targets Counseled on the importance of continuing daily home exercise with Nu Step and reviewed exercise goals with target of 150 minutes per week     Discussed plans with patient for ongoing care management follow up and provided patient with direct contact information for care management team; Reviewed scheduled/upcoming provider appointments including: colonoscopy appointment on 02/26/21, orthopedic appointment on 03/19/21 for left hip injection, primary care follow up on 04/09/21     Confirmed patient received education on prescribed diet CHO modified, no added salt; heart healthy in mail; provided opportunity for questions. Reviewed typical dietary intake providing feedback as needed.  Assessed social determinant of health barriers;   Pain:  (Status: Goal on track: NO.)patient states left hip pain worse Pain assessment performed Medications reviewed Reviewed provider established plan for pain management; Counseled on  the importance of reporting any/all new or changed pain symptoms or management strategies to pain management provider; Positive reinforcement given to patient regarding ongoing weight loss to meet goal to qualify for bilateral hip surgery  Patient Goals/Self-Care Activities: Patient will self administer medications as prescribed Patient will attend all scheduled provider appointments Patient will call pharmacy for medication refills Patient will continue to perform ADL'[name] independently Patient will continue to perform IADL'[name] independently Patient will call provider office for new concerns or questions Patient will work with pharmacist  to discuss any medication concerns or questions Patient will work with BSW to address Section  8 housing questions/concerns       Follow Up:  Patient agrees to Care Plan and Follow-up.  Plan: The Managed Medicaid care management team will reach out to the patient again over the next 30 days.  Date/time of next scheduled RN care management/care coordination outreach:  03/11/21 at 1:00 pm  Kelli Churn RN, CCM, [place] Management Coordinator - Managed [place] High Risk [phone]

## 2021-02-11 NOTE — Patient Instructions (Signed)
Visit Information  [name] was given information about Medicaid Managed Care team care coordination services as a part of their Healthy [facility] Medicaid benefit. [name] verbally consented to engagement with the [facility] Managed Care team.   If you are experiencing a medical emergency, please call 911 or report to your local emergency department or urgent care.   If you have a non-emergency medical problem during routine business hours, please contact your provider's office and ask to speak with a nurse.   For questions related to your Healthy [facility] health plan, please call: [phone] or visit the homepage here: [email or web address]  If you would like to schedule transportation through your Healthy [facility] plan, please call the following number at least 2 days in advance of your appointment: [phone]  Call the [facility] at [phone], at any time, 24 hours a day, 7 days a week. If you are in danger or need immediate medical attention call 911.  If you would like help to quit smoking, call 1-800-QUIT-NOW 7654649642) OR Espaol: 1-855-Djelo-Ya (7-371-062-6948) o para ms informacin haga clic aqu or Text READY to 200-400 to register via text  [name] - following are the goals we discussed in your visit today:   Goals Addressed             This Visit's Progress    Cope with Chronic Pain       Timeframe:  Long-Range Goal Priority:  Medium Start Date:  01/07/21                           Expected End Date:      ongoing                 Follow Up Date 03/11/21    - use distraction techniques -take pain medicine when pain level is 6 or less - review article received in mail entitled " Chronic Pain"  -continue to lose weight in order for qualify for bilateral hip surgery    Why is this important?   Stress makes chronic pain feel worse.  Feelings like depression, anxiety, stress and anger can  make your body more sensitive to pain.  Learning ways to cope with stress or depression may help you find some relief from the pain.     Notes: 02/11/21- states left hip pain is worse despite oxycodone and Tylenol; scheduled for steroid injection on 03/19/21 by orthopedist     Make and Keep All Appointments       Timeframe:  Long-Range Goal Priority:  High Start Date:        01/07/21                     Expected End Date:   ongoing                    Follow Up Date 03/11/21    - arrange a ride through an agency 1 week before appointment - ask family or friend for a ride - call to cancel if needed - keep a calendar with appointment dates in the [facility] spiral bound calendar received in mail   Why is this important?   Part of staying healthy is seeing the doctor for follow-up care.  If you forget your appointments, there are some things you can do to stay on track.    Notes: 02/11/21- patient states he "couldn't make"  colonoscopy appointment; rescheduled for 02/26/21, said his abdominal wound has healed so he did not feel it was necessary to attend the follow up wound clinic appointment on  01/28/21     Monitor and Manage My Blood Sugar-Diabetes Type 2       Timeframe:  Long-Range Goal Priority:  High Start Date:      01/07/21                       Expected End Date:      ongoing                 Follow Up Date 03/11/21    - check blood sugar daily prior to breakfast with goal of <130  - take the blood sugar log to all doctor visits - take the blood sugar meter to all doctor visits  - review educational material entitled " Diabetes Mellitus Basics "received in mail   Why is this important?   Checking your blood sugar at home helps to keep it from getting very high or very low.  Writing the results in a diary or log helps the doctor know how to care for you.  Your blood sugar log should have the time, date and the results.  Also, write down the amount of  insulin or other medicine that you take.  Other information, like what you ate, exercise done and how you were feeling, will also be helpful.     Notes: 02/11/21- patient reports fasting blood sugars <150, taking Ozempic as prescribed and tolerating w/o adverse side effects     Track and Manage My Blood Pressure-Hypertension       Timeframe:  Long-Range Goal Priority:  High Start Date:         01/07/21                    Expected End Date:     ongoing                  Follow Up Date 03/11/21    - pick up Rx for blood pressure monitor at primary care provider's office and take to [facility] - ask for help in using the monitor if you need it - check blood pressure weekly - write blood pressure results in a log or diary  - review educational article on managing high blood pressure entitled "Hypertension" received in the mail - if blood pressure does not meet target of <130/<80; please notify your provider    Why is this important?   You won't feel high blood pressure, but it can still hurt your blood vessels.  High blood pressure can cause heart or kidney problems. It can also cause a stroke.  Making lifestyle changes like losing a little weight or eating less salt will help.  Checking your blood pressure at home and at different times of the day can help to control blood pressure.  If the doctor prescribes medicine remember to take it the way the doctor ordered.  Call the office if you cannot afford the medicine or if there are questions about it.     Notes: 02/11/21- patient states he picked up home BP monitor at [facility] and tries to check his BP daily but often forgets, he is logging the readings; taking his BP medications as prescribed including Losartan that was recently added to his regimen     Weight (lb) < 265 lb (120.2 kg)  The patient verbalized understanding of instructions provided today and declined a print copy of patient instruction materials.    Telephone follow up appointment with Managed Medicaid care management team member scheduled for: 03/11/21  Kelli Churn RN, CCM, [place] Management Coordinator - Managed [place] High Risk [phone]   Following is a copy of your plan of care:  Care Plan : [place]  Updates made by [name], RN since 02/11/2021 12:00 AM     Problem: Chronic Disease Management and Care Coordination Needs for DM, HTN, HLD, CKD      Long-Range Goal: Development of Plan Of Care For Chronic Disease Management and Care Coordination Needs to Assist With Meeting Treatment Goals   Start Date: 01/07/2021  Expected End Date: 05/07/2021  Priority: High  Note:   Current Barriers:  Knowledge Deficits related to plan of care for management of HTN, HLD, and DMII  Chronic Disease Management support and education needs related to HTN, HLD, and DMII  RNCM Clinical Goal(s):  Patient will verbalize understanding of plan for management of HTN, HLD, and DMII take all medications exactly as prescribed and will call provider for medication related questions attend all scheduled medical appointments: colonoscopy appointment on 02/26/21, orthopedic appointment on 03/19/21 for left hip injection, primary care follow up on 04/09/21 demonstrate ongoing adherence to prescribed treatment plan for HTN, HLD, and DMII as evidenced by daily monitoring and recording of fasting CBG,  adherence to ADA/ carb modified, fat modified, no added salt diet, continue 30-40 minutes of exercise on your NuStep 5-7 days/week, adherence to prescribed medication regimen including new diabetes medication,  contacting provider for new or worsened symptoms or questions related to HTN, DM or HLD, CKD continue to work with RN Care Manager to address care management and care coordination needs related to HTN, HLD, and DMII , CKD work with pharmacist to address Medication procurement ( GLP1  receptor agonist) related to DMII through collaboration with Consulting civil engineer, provider, and care team.  Work with BSW to address concerns related to Section 8 housing  Interventions: Inter-disciplinary care team collaboration (see longitudinal plan of care) Referred to BSW per patient request to assist with Section 8 housing Evaluation of current treatment plan related to  self management and patient's adherence to plan as established by provider   Chronic Kidney Disease (Status: Goal on track: NO.) - majority of provider office readings are not meeting treatment targets; patient attributes this to pain Last practice recorded BP readings:  BP Readings from Last 3 Encounters:  02/06/21 (!) 158/84  01/01/21 (!) 156/91  12/30/20 (!) 156/102    Most recent eGFR/CrCl:  Lab Results  Component Value Date   EGFR 55 (L) 01/02/2021    No components found for: CRCL  Verified patient received health education information by mail and provided patient with opportunity to ask questions related to educational articles mailed to him on 01/07/21 related to  "Chronic Kidney Disease" , ADA/ carb modified, fat modified, no added salt diet, HTN, Chronic pain, DM, and HLD Reviewed medications with patient and discussed importance of compliance  ; advised him to increase atorvastatin got 40 mg daily  Verified patient obtained home blood pressure monitor and assessed frequency of self monitored BP, reviewed home and provider office readings, reviewed treatment targets and reviewed lifestyle strategies to assist with improving blood pressure control Counseled on the importance of continuing daily home exercise with Nu Step and reviewed  exercise goals with target of 150 minutes per week     Reviewed scheduled/upcoming provider appointments including:  colonoscopy appointment on 02/26/21, orthopedic appointment on 03/19/21 for left hip injection, primary care follow up on 04/09/21  Discussed plans with patient for  ongoing care management follow up and provided patient with direct contact information for care management team      Diabetes:  (Status: New goal. Goal on track: NO.)- patient reports self monitored fasting blood sugars >130 but <150 Lab Results  Component Value Date   HGBA1C 7.9 (H) 01/02/2021  Confirmed patient received DM education in mail; provided opportunity for questions Assessed frequency of CBG testing and reviewed fasting target Reviewed medications with patient and discussed importance of medication adherence; - discussed mechanism of action of Ozempic and assessed for adverse side effects       Reviewed typical food intake and congratulated patient on consuming only water to drink, eating CHO modified breakfast, lunch and light dinner Discussed plans with patient for ongoing care management follow up and provided patient with direct contact information for care management team;      Reviewed scheduled/upcoming provider appointments including:  colonoscopy appointment on 02/26/21, orthopedic appointment on 03/19/21 for left hip injection, primary care follow up on 04/09/21           Assessed social determinant of health barriers;         Health Maintenance (Status: Goal on track: YES.)  Patient interviewed about adult health maintenance status including Colonoscopy and verified understanding of bowel prep for procedure on 02/26/21   Pneumonia Vaccine- states he received PNA vaccine 4 years ago Influenza Vaccine- states he received flu shot on 02/06/21 COVID vaccination   - pt states he will receive at next primary care appointment on 04/09/21  Shingles Vaccination- received first dose on 02/06/21 Regular eye checkups- says he sees his eye doctor every 6 months due to glaucoma and his next appointment is in November  Advanced Directives- states he does not have and does not want information at this time           Hyperlipidemia:  (Status: will have lipid panel rechecked in 2 months  (Nov) and statin dose increased to high intensity) Lab Results  Component Value Date   CHOL 174 01/02/2021   HDL 37 (L) 01/02/2021   LDLCALC 91 01/02/2021   TRIG 276 (H) 01/02/2021   CHOLHDL 6.1 (H) 12/20/2019     Medication review performed; medication list updated in electronic medical record.  Provider established cholesterol goals reviewed; Counseled on importance of regular laboratory monitoring as prescribed; Confirmed patient received HLD educational material "High Cholesterol" and "Cholesterol Content In Foods" in mail, provided opportunity for questions Reviewed role and benefits of statin for ASCVD risk reduction; Reviewed exercise goals and target of 150 minutes per week; Assessed social determinant of health barriers  Hypertension: (Status: Goal on track: NO.)-majority of provider office readings are not meeting treatment targets; patient attributes this to pain, patient is now self monitoring blood pressure at home Last practice recorded BP readings:  BP Readings from Last 3 Encounters:  02/06/21 (!) 158/84  01/01/21 (!) 156/91  12/30/20 (!) 156/102    Most recent eGFR/CrCl:  Lab Results  Component Value Date   EGFR 55 (L) 01/02/2021    No components found for: CRCL  Evaluation of current treatment plan related to hypertension self management and patient's adherence to plan as established by provider;   Reviewed BP medications and assessed medication taking  behavior ensuring he is taking Losartan that was recently added to his regimen Provided education to patient re: stroke prevention, s/s of heart attack and stroke; Ensured patient obtained home blood pressure monitor  Assessed frequency of self monitored blood press and readings, reviewed treatment targets Counseled on the importance of continuing daily home exercise with Nu Step and reviewed exercise goals with target of 150 minutes per week     Discussed plans with patient for ongoing care management follow up  and provided patient with direct contact information for care management team; Reviewed scheduled/upcoming provider appointments including: colonoscopy appointment on 02/26/21, orthopedic appointment on 03/19/21 for left hip injection, primary care follow up on 04/09/21     Confirmed patient received education on prescribed diet CHO modified, no added salt; heart healthy in mail; provided opportunity for questions. Reviewed typical dietary intake providing feedback as needed.  Assessed social determinant of health barriers;   Pain:  (Status: Goal on track: NO.)patient states left hip pain worse Pain assessment performed Medications reviewed Reviewed provider established plan for pain management; Counseled on the importance of reporting any/all new or changed pain symptoms or management strategies to pain management provider; Positive reinforcement given to patient regarding ongoing weight loss to meet goal to qualify for bilateral hip surgery  Patient Goals/Self-Care Activities: Patient will self administer medications as prescribed Patient will attend all scheduled provider appointments Patient will call pharmacy for medication refills Patient will continue to perform ADL's independently Patient will continue to perform IADL's independently Patient will call provider office for new concerns or questions Patient will work with pharmacist  to discuss any medication concerns or questions Patient will work with BSW to address Section  8 housing questions/concerns

## 2021-02-26 ENCOUNTER — Ambulatory Visit (HOSPITAL_COMMUNITY): Payer: Medicaid Other | Admitting: Anesthesiology

## 2021-02-26 ENCOUNTER — Ambulatory Visit (HOSPITAL_COMMUNITY)
Admission: RE | Admit: 2021-02-26 | Discharge: 2021-02-26 | Disposition: A | Discharge status: Home / Self Care | Payer: Medicaid Other | Attending: Gastroenterology | Admitting: Gastroenterology

## 2021-02-26 ENCOUNTER — Encounter (HOSPITAL_COMMUNITY): Payer: Self-pay | Admitting: Gastroenterology

## 2021-02-26 ENCOUNTER — Other Ambulatory Visit: Payer: Self-pay

## 2021-02-26 ENCOUNTER — Encounter (HOSPITAL_COMMUNITY)
Admission: RE | Disposition: A | Discharge status: Home / Self Care | Payer: Self-pay | Source: Home / Self Care | Attending: Gastroenterology

## 2021-02-26 DIAGNOSIS — Z9884 Bariatric surgery status: Secondary | ICD-10-CM | POA: Insufficient documentation

## 2021-02-26 DIAGNOSIS — D120 Benign neoplasm of cecum: Secondary | ICD-10-CM

## 2021-02-26 DIAGNOSIS — K6389 Other specified diseases of intestine: Secondary | ICD-10-CM | POA: Diagnosis not present

## 2021-02-26 DIAGNOSIS — D124 Benign neoplasm of descending colon: Secondary | ICD-10-CM | POA: Insufficient documentation

## 2021-02-26 DIAGNOSIS — Z79899 Other long term (current) drug therapy: Secondary | ICD-10-CM | POA: Insufficient documentation

## 2021-02-26 DIAGNOSIS — D125 Benign neoplasm of sigmoid colon: Secondary | ICD-10-CM | POA: Diagnosis not present

## 2021-02-26 DIAGNOSIS — K449 Diaphragmatic hernia without obstruction or gangrene: Secondary | ICD-10-CM | POA: Diagnosis not present

## 2021-02-26 DIAGNOSIS — E119 Type 2 diabetes mellitus without complications: Secondary | ICD-10-CM | POA: Diagnosis not present

## 2021-02-26 DIAGNOSIS — K649 Unspecified hemorrhoids: Secondary | ICD-10-CM | POA: Insufficient documentation

## 2021-02-26 DIAGNOSIS — D123 Benign neoplasm of transverse colon: Secondary | ICD-10-CM | POA: Diagnosis not present

## 2021-02-26 DIAGNOSIS — Z79891 Long term (current) use of opiate analgesic: Secondary | ICD-10-CM | POA: Insufficient documentation

## 2021-02-26 DIAGNOSIS — E1122 Type 2 diabetes mellitus with diabetic chronic kidney disease: Secondary | ICD-10-CM | POA: Diagnosis not present

## 2021-02-26 DIAGNOSIS — D122 Benign neoplasm of ascending colon: Secondary | ICD-10-CM

## 2021-02-26 DIAGNOSIS — K635 Polyp of colon: Secondary | ICD-10-CM | POA: Diagnosis not present

## 2021-02-26 DIAGNOSIS — Z6841 Body Mass Index (BMI) 40.0 and over, adult: Secondary | ICD-10-CM | POA: Insufficient documentation

## 2021-02-26 DIAGNOSIS — E669 Obesity, unspecified: Secondary | ICD-10-CM | POA: Insufficient documentation

## 2021-02-26 DIAGNOSIS — D509 Iron deficiency anemia, unspecified: Secondary | ICD-10-CM | POA: Diagnosis not present

## 2021-02-26 DIAGNOSIS — E785 Hyperlipidemia, unspecified: Secondary | ICD-10-CM | POA: Insufficient documentation

## 2021-02-26 DIAGNOSIS — N183 Chronic kidney disease, stage 3 unspecified: Secondary | ICD-10-CM | POA: Insufficient documentation

## 2021-02-26 DIAGNOSIS — I129 Hypertensive chronic kidney disease with stage 1 through stage 4 chronic kidney disease, or unspecified chronic kidney disease: Secondary | ICD-10-CM | POA: Diagnosis not present

## 2021-02-26 HISTORY — PX: BIOPSY: SHX5522

## 2021-02-26 HISTORY — PX: ESOPHAGOGASTRODUODENOSCOPY (EGD) WITH PROPOFOL: SHX5813

## 2021-02-26 HISTORY — PX: COLONOSCOPY WITH PROPOFOL: SHX5780

## 2021-02-26 HISTORY — PX: POLYPECTOMY: SHX149

## 2021-02-26 LAB — HM COLONOSCOPY

## 2021-02-26 LAB — GLUCOSE, CAPILLARY: Glucose-Capillary: 129 mg/dL — ABNORMAL HIGH (ref 70–99)

## 2021-02-26 SURGERY — COLONOSCOPY WITH PROPOFOL
Anesthesia: General

## 2021-02-26 MED ORDER — LACTATED RINGERS IV SOLN: INTRAVENOUS | Status: DC

## 2021-02-26 MED ORDER — PROPOFOL 10 MG/ML IV BOLUS
INTRAVENOUS | Status: DC | PRN
  Administered 2021-02-26: 40 mg via INTRAVENOUS
  Administered 2021-02-26: 20 mg via INTRAVENOUS
  Administered 2021-02-26: 50 mg via INTRAVENOUS
  Administered 2021-02-26 (×2): 20 mg via INTRAVENOUS
  Administered 2021-02-26: 50 mg via INTRAVENOUS
  Administered 2021-02-26: 30 mg via INTRAVENOUS
  Administered 2021-02-26: 40 mg via INTRAVENOUS
  Administered 2021-02-26: 50 mg via INTRAVENOUS
  Administered 2021-02-26: 20 mg via INTRAVENOUS

## 2021-02-26 MED ORDER — LIDOCAINE HCL 1 % IJ SOLN
INTRAMUSCULAR | Status: DC | PRN
  Administered 2021-02-26: 50 mg via INTRADERMAL

## 2021-02-26 MED ORDER — PROPOFOL 500 MG/50ML IV EMUL
INTRAVENOUS | Status: DC | PRN
  Administered 2021-02-26: 150 ug/kg/min via INTRAVENOUS

## 2021-02-26 MED ORDER — STERILE WATER FOR IRRIGATION IR SOLN
Status: DC | PRN
  Administered 2021-02-26: 300 mL

## 2021-02-26 MED ORDER — PROPOFOL 10 MG/ML IV BOLUS
INTRAVENOUS | Status: AC
  Filled 2021-02-26: qty 60

## 2021-02-26 NOTE — Anesthesia Preprocedure Evaluation (Signed)
Anesthesia Evaluation  Patient identified by MRN, date of birth, ID band Patient awake    Reviewed: Allergy & Precautions, H&P , NPO status , Patient's Chart, lab work & pertinent test results, reviewed documented beta blocker date and time   History of Anesthesia Complications (+) PONV and history of anesthetic complications  Airway Mallampati: II  TM Distance: >3 FB Neck ROM: full    Dental no notable dental hx.    Pulmonary sleep apnea ,    Pulmonary exam normal breath sounds clear to auscultation       Cardiovascular Exercise Tolerance: Good hypertension, + dysrhythmias  Rhythm:regular Rate:Normal     Neuro/Psych PSYCHIATRIC DISORDERS Depression  Neuromuscular disease CVA    GI/Hepatic Neg liver ROS, GERD  Medicated,  Endo/Other  negative endocrine ROSdiabetes  Renal/GU CRFRenal disease  negative genitourinary   Musculoskeletal   Abdominal   Peds  Hematology  (+) Blood dyscrasia, anemia ,   Anesthesia Other Findings   Reproductive/Obstetrics negative OB ROS                             Anesthesia Physical Anesthesia Plan  ASA: 3  Anesthesia Plan: General   Post-op Pain Management:    Induction:   PONV Risk Score and Plan: Propofol infusion  Airway Management Planned:   Additional Equipment:   Intra-op Plan:   Post-operative Plan:   Informed Consent: I have reviewed the patients History and Physical, chart, labs and discussed the procedure including the risks, benefits and alternatives for the proposed anesthesia with the patient or authorized representative who has indicated his/her understanding and acceptance.     Dental Advisory Given  Plan Discussed with: CRNA  Anesthesia Plan Comments:         Anesthesia Quick Evaluation

## 2021-02-26 NOTE — Discharge Instructions (Addendum)
You are being discharged to home.  Resume your previous diet.  Continue oral iron every other day. We are waiting for your pathology results.  Your physician has recommended a repeat colonoscopy in three years for surveillance.

## 2021-02-26 NOTE — H&P (Signed)
[name] is an 50 y.o. male.   Chief Complaint: Iron deficiency anemia  HPI: 50 year old male with past medical history of CKD, chronic anemia, diabetes, depression, chronic narcotic use, stroke, hypertension, hyperlipidemia, history of gout, obesity status post RYGB in 2010, who presents for evaluation of iron deficiency anemia.  Patient denies having any melena, hematochezia, nausea, vomiting, fever, chills, abdominal distention.  He was found to have low iron stores in the past but most recent iron stores from December showed resolution of iron deficiency.  Currently on oral iron.  Past Medical History:  Diagnosis Date   ALLERGY 08/03/2008   Qualifier: Diagnosis of  By: Christ Kick     Anemia    Arthritis    "hips" (01/15/2015)   Blood transfusion without reported diagnosis    Cellulitis 07/25/2018   Chronic back pain greater than 3 months duration 02/11/2017   Chronic lower back pain    Colon polyps    COLONIC POLYPS, ADENOMATOUS 08/03/2008   Qualifier: Diagnosis of  By: Christ Kick     CVA 08/03/2008   Qualifier: History of  By: Christ Kick     Depression    denies   Diabetes mellitus    diet controlled- no meds since wt loss   Diabetes mellitus ([place]) 07/10/2019   Dysrhythmia    s/p surgery bariatric- cardioversion   Erectile dysfunction 01/26/2018   GERD (gastroesophageal reflux disease)    HIATAL HERNIA 08/03/2008   Qualifier: Diagnosis of  By: Christ Kick     Hip pain    History of gout    Hyperlipidemia    Hypertension    Joint pain    Narcotic dependence ([place]) 02/11/2017   Neuromuscular disorder ([place])    PONV (postoperative nausea and vomiting)    Pre-diabetes    Prediabetes 03/29/2017   Sleep apnea    no longer have to use cpap since wt loss   Stage 3 chronic kidney disease ([place]) 01/26/2018   Stroke ([place]) 2006   denies residual on 01/15/2015   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 ([place]) 08/01/2018    Past Surgical History:  Procedure  Laterality Date   BARIATRIC SURGERY  2010   in [place] N/A 08/02/2014   Procedure: COLONOSCOPY;  Surgeon: Rogene Houston, MD;  [facility];  Service: Endoscopy;  Laterality: N/A;  210 - moved to 3/10 @ 11:55 - Ann notified pt   ESOPHAGOGASTRODUODENOSCOPY     HERNIA REPAIR     LIPOSUCTION     PANNICULECTOMY  01/14/2015   PANNICULECTOMY N/A 01/14/2015   Procedure:  TOTAL PANNICULECTOMY WTH LIPOSUCTION OF SIDES;  Surgeon: Cristine Polio, MD;  Location: [facility];  Service: Plastics;  Laterality: N/A;   TONSILLECTOMY     VENTRAL HERNIA REPAIR  01/14/2015   VENTRAL HERNIA REPAIR N/A 01/14/2015   Procedure: HERNIA REPAIR VENTRAL ADULT AND MUSCLE REPAIR;  Surgeon: Cristine Polio, MD;  Location: [facility];  Service: Plastics;  Laterality: N/A;    Family History  Problem Relation Age of Onset   COPD Mother    Depression Mother    Hyperlipidemia Mother    Hypertension Mother    Heart disease Mother    Thyroid disease Mother    Anxiety disorder Mother    Diabetes Father    Pulmonary embolism Father 57       blood clot   Hyperlipidemia Father    Sudden death Father  Obesity Father    Eating disorder Father    Hypertension Brother    Social History:  reports that he has never smoked. He has never used smokeless tobacco. He reports that he does not drink alcohol and does not use drugs.  Allergies: No Known Allergies  Medications Prior to Admission  Medication Sig Dispense Refill   acetaminophen (TYLENOL) 500 MG tablet Take 1,000 mg by mouth every 6 (six) hours as needed for moderate pain.     atorvastatin (LIPITOR) 40 MG tablet Take 1 tablet (40 mg total) by mouth daily. 90 tablet 3   cyclobenzaprine (FLEXERIL) 10 MG tablet Take 1 tablet (10 mg total) by mouth 2 (two) times daily as needed for muscle spasms. 60 tablet 0   ferrous sulfate 324 MG TBEC Take 324 mg by mouth every other day.     Iron Combinations (IRON COMPLEX PO) Take 65 mg by mouth  daily.     latanoprost (XALATAN) 0.005 % ophthalmic solution Place 1 drop into both eyes at bedtime.     methylcellulose oral powder Take 1 packet by mouth daily. (Patient not taking: No sig reported)     Multiple Vitamin (MULTIVITAMIN WITH MINERALS) TABS tablet Take 1 tablet by mouth daily.     Na Sulfate-K Sulfate-Mg Sulf (SUPREP BOWEL PREP KIT) 17.5-3.13-1.6 GM/177ML SOLN Take 1 kit by mouth as directed. 354 mL 0   NARCAN 4 MG/0.1ML LIQD nasal spray kit Place 0.4 mg into the nose once.     Oxycodone HCl 20 MG TABS Take 20 mg by mouth every 6 (six) hours as needed (pain).     OZEMPIC, 0.25 OR 0.5 MG/DOSE, 2 MG/1.5ML SOPN INJECT (0.5)MG INTO THE SKIN ONCE WEEKLY 1.5 mL 0   timolol (BETIMOL) 0.5 % ophthalmic solution Place 1 drop into both eyes in the morning.     blood glucose meter kit and supplies KIT Test daily Dx  r73.03 1 each 0   Blood Pressure Monitor KIT 1 each by Does not apply route daily. 1 kit 0   glucose blood test strip Use as instructed. Can check up to 4 times daily. 100 each 3   losartan (COZAAR) 25 MG tablet Take by mouth.     Semaglutide, 1 MG/DOSE, 4 MG/3ML SOPN Inject 1 mg as directed once a week. 3 mL 2   sildenafil (VIAGRA) 50 MG tablet Take 1 tablet (50 mg total) by mouth daily as needed for erectile dysfunction. (Patient not taking: Reported on 02/06/2021) 20 tablet 0    Results for orders placed or performed during the hospital encounter of 02/26/21 (from the past 48 hour(s))  Glucose, capillary     Status: Abnormal   Collection Time: 02/26/21  7:45 AM  Result Value Ref Range   Glucose-Capillary 129 (H) 70 - 99 mg/dL    Comment: Glucose reference range applies only to samples taken after fasting for at least 8 hours.   No results found.  Review of Systems  Constitutional: Negative.   HENT: Negative.    Eyes: Negative.   Respiratory: Negative.    Cardiovascular: Negative.   Gastrointestinal: Negative.   Endocrine: Negative.   Genitourinary: Negative.    Musculoskeletal: Negative.   Skin: Negative.   Allergic/Immunologic: Negative.   Neurological: Negative.   Hematological: Negative.   Psychiatric/Behavioral: Negative.     Blood pressure 104/60, pulse 97, temperature 98.4 F (36.9 C), temperature source Oral, resp. rate 13, height '5\' 8"'  (1.727 m), weight 126.6 kg, SpO2 100 %. Physical Exam  GENERAL: The patient is AO x3, in no acute distress. HEENT: Head is normocephalic and atraumatic. EOMI are intact. Mouth is well hydrated and without lesions. NECK: Supple. No masses LUNGS: Clear to auscultation. No presence of rhonchi/wheezing/rales. Adequate chest expansion HEART: RRR, normal s1 and s2. ABDOMEN: Soft, nontender, no guarding, no peritoneal signs, and nondistended. BS +. No masses. EXTREMITIES: Without any cyanosis, clubbing, rash, lesions or edema. NEUROLOGIC: AOx3, no focal motor deficit. SKIN: no jaundice, no rashes  Assessment/Plan 50 year old male with past medical history of CKD, chronic anemia, diabetes, depression, chronic narcotic use, stroke, hypertension, hyperlipidemia, history of gout, obesity status post RYGB in 2010, who presents for evaluation of iron deficiency anemia.  Proceed with EGD and colonoscopy.  Harvel Quale, MD 02/26/2021, 8:18 AM

## 2021-02-26 NOTE — Op Note (Addendum)
[facility] Patient Name: [name] Procedure Date: 02/26/2021 8:43 AM MRN: [id number] Date of Birth: [birth year] Attending MD: Maylon Peppers ,  CSN: [id number] Age: 50 Admit Type: Outpatient Procedure:                Upper GI endoscopy Indications:              Iron deficiency anemia Providers:                Maylon Peppers, Crystal Page, Raphael Gibney,                            Technician Referring MD:              Medicines:                Monitored Anesthesia Care Complications:            No immediate complications. Estimated Blood Loss:     Estimated blood loss: none. Procedure:                Pre-Anesthesia Assessment:                           - Prior to the procedure, a History and Physical                            was performed, and patient medications, allergies                            and sensitivities were reviewed. The patient's                            tolerance of previous anesthesia was reviewed.                           - The risks and benefits of the procedure and the                            sedation options and risks were discussed with the                            patient. All questions were answered and informed                            consent was obtained.                           - ASA Grade Assessment: III - A patient with severe                            systemic disease.                           After obtaining informed consent, the endoscope was                            passed under direct vision. Throughout the  procedure, the patient's blood pressure, pulse, and                            oxygen saturations were monitored continuously. The                            [id number]-[id number] ([id number]) scope was introduced through the                            mouth, and advanced to the second part of duodenum.                            The upper GI endoscopy was accomplished without                             difficulty. The patient tolerated the procedure                            well. Scope In: 9:01:17 AM Scope Out: 9:05:10 AM Total Procedure Duration: 0 hours 3 minutes 53 seconds  Findings:      A 10 cm hiatal hernia was present (extends between 40 - 50 cm from       incisors).      A medium amount of food (residue) was found in the entire examined       stomach. The gastric chamber was small but no lesions were observed.       Notably, there was no evidence of any anastomosis in the stomach       (unclear what type of bariatric surgery he had but not typical anatomy       for RYGB.      The examined duodenum was normal. There was no jejunal pouch. Impression:               - 10 cm hiatal hernia.                           - A medium amount of food (residue) in the stomach.                            Small gastric pouch. Unclear previous bariatric                            surgery.                           - Normal examined duodenum.                           - No specimens collected. Moderate Sedation:      Per Anesthesia Care Recommendation:           - Discharge patient to home (ambulatory).                           - Resume previous diet.                           -  Continue oral iron every other day. Procedure Code(s):        --- Professional ---                           [phone], Esophagogastroduodenoscopy, flexible,                            transoral; diagnostic, including collection of                            specimen(s) by brushing or washing, when performed                            (separate procedure) Diagnosis Code(s):        --- Professional ---                           K44.9, Diaphragmatic hernia without obstruction or                            gangrene                           D50.9, Iron deficiency anemia, unspecified CPT copyright 2019 [employer]. All rights reserved. The codes documented in this report are preliminary and upon coder  review may  be revised to meet current compliance requirements. Maylon Peppers, MD Maylon Peppers,  02/26/2021 9:14:02 AM This report has been signed electronically. Number of Addenda: 0

## 2021-02-26 NOTE — Anesthesia Postprocedure Evaluation (Signed)
Anesthesia Post Note  Patient: [name]  Procedure(s) Performed: COLONOSCOPY WITH PROPOFOL ESOPHAGOGASTRODUODENOSCOPY (EGD) WITH PROPOFOL POLYPECTOMY INTESTINAL BIOPSY  Patient location during evaluation: Endoscopy Anesthesia Type: General Level of consciousness: awake and alert Pain management: pain level controlled Vital Signs Assessment: post-procedure vital signs reviewed and stable Respiratory status: spontaneous breathing Cardiovascular status: blood pressure returned to baseline and stable Postop Assessment: no apparent nausea or vomiting Anesthetic complications: no   No notable events documented.   Last Vitals:  Vitals:   02/26/21 0744  BP: 104/60  Pulse: 97  Resp: 13  Temp: 36.9 C  SpO2: 100%    Last Pain:  Vitals:   02/26/21 0852  TempSrc:   PainSc: 4                  Shriyans Kuenzi

## 2021-02-26 NOTE — Op Note (Signed)
[facility] Patient Name: [name] Procedure Date: 02/26/2021 9:12 AM MRN: [id number] Date of Birth: [birth year] Attending MD: Maylon Peppers ,  CSN: [id number] Age: 50 Admit Type: Outpatient Procedure:                Colonoscopy Indications:              Iron deficiency anemia Providers:                Maylon Peppers, Crystal Page, Raphael Gibney,                            Technician Referring MD:              Medicines:                Monitored Anesthesia Care Complications:            No immediate complications. Estimated Blood Loss:     Estimated blood loss: none. Procedure:                Pre-Anesthesia Assessment:                           - Prior to the procedure, a History and Physical                            was performed, and patient medications, allergies                            and sensitivities were reviewed. The patient's                            tolerance of previous anesthesia was reviewed.                           - The risks and benefits of the procedure and the                            sedation options and risks were discussed with the                            patient. All questions were answered and informed                            consent was obtained.                           - ASA Grade Assessment: III - A patient with severe                            systemic disease.                           After obtaining informed consent, the colonoscope                            was passed under direct vision. Throughout the  procedure, the patient's blood pressure, pulse, and                            oxygen saturations were monitored continuously. The                            [id number]-HQ190L ([id number]) scope was introduced through                            the anus and advanced to the the cecum, identified                            by appendiceal orifice and ileocecal valve. The                             colonoscopy was performed without difficulty. The                            patient tolerated the procedure well. The quality                            of the bowel preparation was adequate. Scope In: 9:15:34 AM Scope Out: 3:41:93 AM Scope Withdrawal Time: 0 hours 30 minutes 39 seconds  Total Procedure Duration: 0 hours 41 minutes 10 seconds  Findings:      Hemorrhoids were found on perianal exam.      Ten sessile polyps were found in the sigmoid colon, descending colon,       transverse colon, ascending colon and cecum. The polyps were 3 to 10 mm       in size. These polyps were removed with a cold snare. Resection and       retrieval were complete.      An area of congested mucosa - thickened fold was found in the transverse       colon. Biopsies were taken with a cold forceps for histology.      The retroflexed view of the distal rectum and anal verge was normal and       showed no anal or rectal abnormalities. Impression:               - Hemorrhoids found on perianal exam.                           - Ten 3 to 10 mm polyps in the sigmoid colon, in                            the descending colon, in the transverse colon, in                            the ascending colon and in the cecum, removed with                            a cold snare. Resected and retrieved.                           -  Congested mucosa in the transverse colon.                            Biopsied.                           - The distal rectum and anal verge are normal on                            retroflexion view. Moderate Sedation:      Per Anesthesia Care Recommendation:           - Discharge patient to home (ambulatory).                           - Resume previous diet.                           - Await pathology results.                           - Repeat colonoscopy in 3 years for surveillance. Procedure Code(s):        --- Professional ---                           [phone], Colonoscopy, flexible; with  removal of                            tumor(s), polyp(s), or other lesion(s) by snare                            technique                           [id number], 53, Colonoscopy, flexible; with biopsy,                            single or multiple Diagnosis Code(s):        --- Professional ---                           K63.5, Polyp of colon                           K63.89, Other specified diseases of intestine                           K64.9, Unspecified hemorrhoids                           D50.9, Iron deficiency anemia, unspecified CPT copyright 2019 American Medical Association. All rights reserved. The codes documented in this report are preliminary and upon coder review may  be revised to meet current compliance requirements. Maylon Peppers, MD Maylon Peppers,  02/26/2021 10:03:35 AM This report has been signed electronically. Number of Addenda: 0

## 2021-02-26 NOTE — Transfer of Care (Signed)
Immediate Anesthesia Transfer of Care Note  Patient: [name]  Procedure(s) Performed: COLONOSCOPY WITH PROPOFOL ESOPHAGOGASTRODUODENOSCOPY (EGD) WITH PROPOFOL POLYPECTOMY INTESTINAL BIOPSY  Patient Location: Endoscopy Unit  Anesthesia Type:General  Level of Consciousness: awake  Airway & Oxygen Therapy: Patient Spontanous Breathing  Post-op Assessment: Report given to RN  Post vital signs: Reviewed  Last Vitals:  Vitals Value Taken Time  BP    Temp    Pulse    Resp    SpO2      Last Pain:  Vitals:   02/26/21 0852  TempSrc:   PainSc: 4       Patients Stated Pain Goal: 6 (64/38/37 7939)  Complications: No notable events documented.

## 2021-02-27 ENCOUNTER — Encounter (INDEPENDENT_AMBULATORY_CARE_PROVIDER_SITE_OTHER): Payer: Self-pay | Admitting: *Deleted

## 2021-02-27 LAB — SURGICAL PATHOLOGY

## 2021-03-04 ENCOUNTER — Encounter (HOSPITAL_COMMUNITY): Payer: Self-pay | Admitting: Gastroenterology

## 2021-03-05 ENCOUNTER — Other Ambulatory Visit: Payer: Self-pay | Admitting: Nurse Practitioner

## 2021-03-09 ENCOUNTER — Other Ambulatory Visit: Payer: Self-pay | Admitting: Nurse Practitioner

## 2021-03-10 MED ORDER — OZEMPIC (0.25 OR 0.5 MG/DOSE) 2 MG/1.5ML ~~LOC~~ SOPN: 0.5000 mg | PEN_INJECTOR | SUBCUTANEOUS | 0 refills | Status: DC

## 2021-03-11 ENCOUNTER — Other Ambulatory Visit: Payer: Self-pay | Admitting: *Deleted

## 2021-03-11 ENCOUNTER — Other Ambulatory Visit: Payer: Self-pay | Admitting: Family Medicine

## 2021-03-11 DIAGNOSIS — N529 Male erectile dysfunction, unspecified: Secondary | ICD-10-CM

## 2021-03-11 NOTE — Patient Instructions (Signed)
Visit Information  [name] was given information about Medicaid Managed Care team care coordination services as a part of their Healthy [facility] Medicaid benefit. [name] verbally consented to engagement with the [facility] Managed Care team.   If you are experiencing a medical emergency, please call 911 or report to your local emergency department or urgent care.   If you have a non-emergency medical problem during routine business hours, please contact your provider's office and ask to speak with a nurse.   For questions related to your Healthy [facility] health plan, please call: [phone] or visit the homepage here: [email or web address]  If you would like to schedule transportation through your Healthy [facility] plan, please call the following number at least 2 days in advance of your appointment: [phone]  Call the [facility] at [phone], at any time, 24 hours a day, 7 days a week. If you are in danger or need immediate medical attention call 911.  If you would like help to quit smoking, call 1-800-QUIT-NOW 438-179-6880) OR Espaol: 1-855-Djelo-Ya (6-468-032-1224) o para ms informacin haga clic aqu or Text READY to 200-400 to register via text  [name] - following are the goals we discussed in your visit today:   Goals Addressed             This Visit's Progress    Cope with Chronic Pain       Timeframe:  Long-Range Goal Priority:  Medium Start Date:  01/07/21                           Expected End Date:      ongoing                 Follow Up Date 04/11/21    - use distraction techniques -take pain medicine when pain level is 6 or less - review article received in mail entitled " Chronic Pain"  -continue to lose weight in order for qualify for bilateral hip surgery    Why is this important?   Stress makes chronic pain feel worse.  Feelings like depression, anxiety, stress and anger can  make your body more sensitive to pain.  Learning ways to cope with stress or depression may help you find some relief from the pain.     Notes: 03/11/21- states left hip pain is worse despite oxycodone and Tylenol; scheduled for steroid injection on 03/12/21 by orthopedist     Make and Keep All Appointments       Timeframe:  Long-Range Goal Priority:  High Start Date:        01/07/21                     Expected End Date:   ongoing                    Follow Up Date 04/11/21    - arrange a ride through an agency 1 week before appointment - ask family or friend for a ride - call to cancel if needed - keep a calendar with appointment dates in the [facility] spiral bound calendar received in mail   Why is this important?   Part of staying healthy is seeing the doctor for follow-up care.  If you forget your appointments, there are some things you can do to stay on track.    Notes: 03/11/21- patient states he completed colonoscopy  and endoscopy on  02/26/21, he states he will keep appointment for pain snot in left hip on 03/12/21 and with primary care provider on 04/09/21     Monitor and Manage My Blood Sugar-Diabetes Type 2       Timeframe:  Long-Range Goal Priority:  High Start Date:      01/07/21                       Expected End Date:      ongoing                 Follow Up Date 04/11/21    - check blood sugar daily prior to breakfast with goal of <130  - take the blood sugar log to all doctor visits - take the blood sugar meter to all doctor visits  - review educational material entitled " Diabetes Mellitus Basics "received in mail   Why is this important?   Checking your blood sugar at home helps to keep it from getting very high or very low.  Writing the results in a diary or log helps the doctor know how to care for you.  Your blood sugar log should have the time, date and the results.  Also, write down the amount of insulin or other medicine that  you take.  Other information, like what you ate, exercise done and how you were feeling, will also be helpful.     Notes: 03/11/21- patient reports fasting blood sugars <120-140, taking Ozempic as prescribed and tolerating w/o adverse side effects     Track and Manage My Blood Pressure-Hypertension       Timeframe:  Long-Range Goal Priority:  High Start Date:         01/07/21                    Expected End Date:     ongoing                  Follow Up Date 04/11/21    - pick up Rx for blood pressure monitor at primary care provider's office and take to [facility] - ask for help in using the monitor if you need it - check blood pressure weekly - write blood pressure results in a log or diary  - review educational article on managing high blood pressure entitled "Hypertension" received in the mail - if blood pressure does not meet target of <130/<80; please notify your provider    Why is this important?   You won't feel high blood pressure, but it can still hurt your blood vessels.  High blood pressure can cause heart or kidney problems. It can also cause a stroke.  Making lifestyle changes like losing a little weight or eating less salt will help.  Checking your blood pressure at home and at different times of the day can help to control blood pressure.  If the doctor prescribes medicine remember to take it the way the doctor ordered.  Call the office if you cannot afford the medicine or if there are questions about it.     Notes: 03/11/21- patient states he tries to check his BP daily but often forgets, he is logging the readings; taking his BP medications as prescribed including Losartan that was recently added to his regimen        The patient verbalized understanding of instructions provided today and declined a print copy of patient instruction materials.  The Managed Medicaid care management team will reach out to the patient again over the next 30 days.   Kelli Churn RN, CCM, [place] Management Coordinator - Managed [place] High Risk [phone]   Following is a copy of your plan of care:  Care Plan : RN Care Manager Plan Of Care  Updates made by [name], RN since 03/11/2021 12:00 AM     Problem: Chronic Disease Management and Care Coordination Needs for DM, HTN, HLD, CKD      Long-Range Goal: Development of Plan Of Care For Chronic Disease Management and Care Coordination Needs to Assist With Meeting Treatment Goals   Start Date: 01/07/2021  Expected End Date: 05/07/2021  Priority: High  Note:   Current Barriers:  Knowledge Deficits related to plan of care for management of HTN, HLD, and DMII, chronic pain Chronic Disease Management support and education needs related to HTN, HLD, and DMII, chronic pain  RNCM Clinical Goal(s):  Patient will verbalize understanding of plan for management of HTN, HLD, and DMII take all medications exactly as prescribed and will call provider for medication related questions attend all scheduled medical appointments: orthopedic appointment on 03/12/21 for left hip injection, primary care follow up on 04/09/21 demonstrate ongoing adherence to prescribed treatment plan for HTN, HLD, and DMII as evidenced by daily monitoring and recording of fasting CBG,  adherence to ADA/ carb modified, fat modified, no added salt diet, continue 30-40 minutes of exercise on your NuStep 5-7 days/week, adherence to prescribed medication regimen including new diabetes medication,  contacting provider for new or worsened symptoms or questions related to HTN, DM or HLD, CKD continue to work with RN Care Manager to address care management and care coordination needs related to HTN, HLD, and DMII , CKD Work with BSW to address concerns related to Section 8 housing  Interventions: Inter-disciplinary care team collaboration (see longitudinal plan of care) Referred to BSW per patient  request to assist with Section 8 housing Evaluation of current treatment plan related to  self management and patient's adherence to plan as established by provider   Chronic Kidney Disease (Status: Goal on track: YES. Goal on track: NO.) - patient reports home BP readings are usually <140/<90 but reading prior to colonoscopy was not meeting treatment targets Last practice recorded BP readings:  BP Readings from Last 3 Encounters:  02/26/21 (!) 155/82  02/06/21 (!) 158/84  01/01/21 (!) 156/91      Most recent eGFR/CrCl:  Lab Results  Component Value Date   EGFR 55 (L) 01/02/2021    No components found for: CRCL  Reviewed medications with patient and discussed importance of compliance  ; advised him to increase atorvastatin got 40 mg daily  Assessed frequency of self monitored BP, reviewed home and provider office readings, reviewed treatment targets and reviewed lifestyle strategies      Reviewed scheduled/upcoming provider appointments including: orthopedic appointment on 03/12/21 for left hip injection, primary care follow up on 04/09/21  Discussed plans with patient for ongoing care management follow up and provided patient with direct contact information for care management team      Diabetes:  (Status: Goal on track: YES.)- patient reports self monitored fasting blood sugars 120-140 Lab Results  Component Value Date   HGBA1C 7.9 (H) 01/02/2021  Confirmed patient received DM education in mail; provided opportunity for questions Assessed frequency of CBG testing and reviewed fasting target Reviewed medications with patient and discussed importance of  medication adherence Discussed plans with patient for ongoing care management follow up and provided patient with direct contact information for care management team;      Reviewed scheduled/upcoming provider appointments including:  orthopedic appointment on 03/12/21 for left hip injection, primary care follow up on 04/09/21             Health Maintenance (Status: Goal on track: YES.) - patient completed colonoscopy and endoscopy on 02/26/21 and was aware of pathology report- non cancerous Patient interviewed about adult health maintenance status   Pneumonia Vaccine- states he received PNA vaccine 4 years ago Influenza Vaccine- states he received flu shot on 02/06/21 COVID vaccination   - pt states he will receive at next primary care appointment on 04/09/21  Shingles Vaccination- received first dose on 02/06/21 Regular eye checkups- says he sees his eye doctor every 6 months due to glaucoma and his next appointment is in November  Advanced Directives- states he does not have and does not want information at this time           Hyperlipidemia:  (Status: will have lipid panel rechecked in 2 months (Nov) and statin dose increased to high intensity) Lab Results  Component Value Date   CHOL 174 01/02/2021   HDL 37 (L) 01/02/2021   LDLCALC 91 01/02/2021   TRIG 276 (H) 01/02/2021   CHOLHDL 6.1 (H) 12/20/2019     Medication review performed; medication list updated in electronic medical record.    Hypertension: (Status: Goal on track: NO.)-majority of provider office readings are not meeting treatment targets; patient attributes this to pain, patient is now self monitoring blood pressure at home, patient reports home BP readings are usually <140/<90 but reading prior to colonoscopy on 02/26/21 was not meeting treatment targets Last practice recorded BP readings:  BP Readings from Last 3 Encounters:  02/26/21 (!) 155/82  02/06/21 (!) 158/84  01/01/21 (!) 156/91     Most recent eGFR/CrCl:  Lab Results  Component Value Date   EGFR 55 (L) 01/02/2021    No components found for: CRCL  Evaluation of current treatment plan related to hypertension self management and patient's adherence to plan as established by provider;   Reviewed BP medications and assessed medication taking behavior ensuring he is taking Losartan that  was recently added to his regimen Assessed frequency of self monitored blood press and readings, reviewed treatment targets Discussed plans with patient for ongoing care management follow up and provided patient with direct contact information for care management team; Reviewed scheduled/upcoming provider appointments including: colonoscopy appointment on 02/26/21, orthopedic appointment on 03/19/21 for left hip injection, primary care follow up on 04/09/21      Pain:  (Status: Goal on track: NO.)patient states left hip pain worse Pain assessment performed Medications reviewed Reviewed provider established plan for pain management; Counseled on the importance of reporting any/all new or changed pain symptoms or management strategies to pain management provider; Positive reinforcement given to patient regarding ongoing weight loss to meet goal to qualify for bilateral hip surgery  Patient Goals/Self-Care Activities: Patient will self administer medications as prescribed Patient will attend all scheduled provider appointments Patient will call pharmacy for medication refills Patient will continue to perform ADL's independently Patient will continue to perform IADL's independently Patient will call provider office for new concerns or questions Patient will work with pharmacist  to discuss any medication concerns or questions Patient will work with BSW on 03/13/21 to address Section 8 housing questions/concerns

## 2021-03-11 NOTE — Patient Outreach (Signed)
Medicaid Managed Care   Nurse Care Manager Note  03/11/2021 Name:  [name] MRN:  [id number] DOB:  August 28, 1970  [name] is an 50 y.o. year old male who is a primary patient of [name], NP.  The [facility] Managed Care Coordination team was consulted for assistance with:    HTN HLD DM Type 2 CKD Stage3 Chronic Pain  [name] was given information about Medicaid Managed Care Coordination team services today. [name] Patient agreed to services and verbal consent obtained.  Engaged with patient by telephone for initial visit in response to provider referral for case management and/or care coordination services.   Assessments/Interventions:  Review of past medical history, allergies, medications, health status, including review of consultants reports, laboratory and other test data, was performed as part of comprehensive evaluation and provision of chronic care management services.  SDOH (Social Determinants of Health) assessments and interventions performed:   Care Plan  No Known Allergies  Medications Reviewed Today     Reviewed by [name], RN (Registered Nurse) on 03/11/21 at [place] List Status: <None>   Medication Order Taking? Sig Documenting Provider Last Dose Status Informant  acetaminophen (TYLENOL) 500 MG tablet [id number] No Take 1,000 mg by mouth every 6 (six) hours as needed for moderate pain. [provider] 02/26/2021 0300 Active Self  atorvastatin (LIPITOR) 40 MG tablet [id number] No Take 1 tablet (40 mg total) by mouth daily. [name], NP 02/26/2021 0500 Active            Med Note [name], [name]   Tue Feb 11, 2021  1:59 PM) Says he is still taking the 10 mg tablet; instructed him to increase to 40 mg daily per MD order of 01/14/21  blood glucose meter kit and supplies KIT [id number] No Test daily Dx  r73.03 [name], DO Taking Active Self  Blood Pressure Monitor KIT [id number] No 1 each by Does not apply route  daily. [name], NP Taking Active   cyclobenzaprine (FLEXERIL) 10 MG tablet [id number] No Take 1 tablet (10 mg total) by mouth 2 (two) times daily as needed for muscle spasms. Perlie Mayo, NP 02/26/2021 0500 Active Self           Med Note [name], [name]   Tue Jan 07, 2021 10:32 AM) [name] 2 in the morning and 1 at night  ferrous sulfate 324 MG TBEC [id number] No Take 324 mg by mouth every other day. [provider] Past Week Active   glucose blood test strip [id number] No Use as instructed. Can check up to 4 times daily. [name], NP Taking Active   Iron Combinations (IRON COMPLEX PO) [id number] No Take 65 mg by mouth daily. [provider] Past Week Active Self  latanoprost (XALATAN) 0.005 % ophthalmic solution [id number] No Place 1 drop into both eyes at bedtime. [provider] 02/25/2021 Active Self  losartan (COZAAR) 25 MG tablet [id number] No Take by mouth. [provider] Taking Active   methylcellulose oral powder [id number] No Take 1 packet by mouth daily.  Patient not taking: No sig reported   [provider] Not Taking Active   Multiple Vitamin (MULTIVITAMIN WITH MINERALS) TABS tablet [id number] No Take 1 tablet by mouth daily. [provider] 02/26/2021 500 Active Self  NARCAN 4 MG/0.1ML LIQD nasal spray kit [id number] No Place 0.4 mg into the nose once. [provider] Taking Active Self  Oxycodone HCl 20  MG TABS [id number] No Take 20 mg by mouth every 6 (six) hours as needed (pain). [provider] 02/26/2021 0500 Active Self           Med Note [name], [name]   Tue Jan 07, 2021 10:36 AM) [name] prn  Semaglutide, 1 MG/DOSE, 4 MG/3ML SOPN [id number] No Inject 1 mg as directed once a week. [name], NP Taking Active            Med Note [name], [name]   Tue Jan 07, 2021 11:39 AM) waiting for approval  Semaglutide,0.25 or 0.5MG/DOS, (OZEMPIC, 0.25 OR 0.5 MG/DOSE,) 2 MG/1.5ML SOPN [id number]  Inject 0.5 mg  into the skin once a week. [name], NP  Active   sildenafil (VIAGRA) 50 MG tablet [id number] No Take 1 tablet (50 mg total) by mouth daily as needed for erectile dysfunction.  Patient not taking: Reported on 02/06/2021   Perlie Mayo, NP Not Taking Active Self           Med Note [name]   Tue Jan 07, 2021 10:37 AM) Not taking   timolol (BETIMOL) 0.5 % ophthalmic solution [id number] No Place 1 drop into both eyes in the morning. [provider] 02/26/2021 0500 Active Self            Patient Active Problem List   Diagnosis Date Noted   Immunization due 02/06/2021   Iron deficiency anemia 12/30/2020   History of colonic polyps 12/30/2020   Unilateral primary osteoarthritis, right hip 08/13/2020   Unilateral primary osteoarthritis, left hip 08/13/2020   Muscle spasms of both lower extremities 05/23/2020   Need for immunization against influenza 03/06/2020   Primary osteoarthritis of both hips 07/10/2019   Bilateral primary osteoarthritis of hip 07/10/2019   Vitamin D deficiency 04/13/2019   Morbid obesity with body mass index (BMI) of 40.0 to 44.9 in adult ([place]) 04/13/2019   Depressive disorder 04/13/2019   Body mass index (BMI) 45.0-49.9, adult ([place]) 04/13/2019   Kidney disease, chronic, stage III (GFR 30-59 ml/min) ([place]) 07/20/2017   History of cerebrovascular accident 07/20/2017   Gastroesophageal reflux disease 07/20/2017   Chronic kidney disease, stage 3 unspecified ([place]) 07/20/2017   Gastric bypass status for obesity 02/11/2017   Benzodiazepine dependence ([place]) 02/11/2017   Type 2 diabetes mellitus with diabetic nephropathy ([place]) 08/03/2008   Hyperlipidemia 08/03/2008   SLEEP APNEA 08/03/2008   Sleep apnea 08/03/2008     Care Plan : RN Care Manager Plan Of Care  Updates made by [name], RN since 03/11/2021 12:00 AM     Problem: Chronic Disease Management and Care Coordination Needs for DM, HTN, HLD, CKD      Long-Range Goal:  Development of Plan Of Care For Chronic Disease Management and Care Coordination Needs to Assist With Meeting Treatment Goals   Start Date: 01/07/2021  Expected End Date: 05/07/2021  Priority: High  Note:   Current Barriers:  Knowledge Deficits related to plan of care for management of HTN, HLD, and DMII  Chronic Disease Management support and education needs related to HTN, HLD, and DMII  RNCM Clinical Goal(s):  Patient will verbalize understanding of plan for management of HTN, HLD, and DMII take all medications exactly as prescribed and will call provider for medication related questions attend all scheduled medical appointments: orthopedic appointment on 03/12/21 for left hip injection, primary care follow up on 04/09/21 demonstrate ongoing adherence to prescribed treatment plan for HTN, HLD, and DMII as evidenced  by daily monitoring and recording of fasting CBG,  adherence to ADA/ carb modified, fat modified, no added salt diet, continue 30-40 minutes of exercise on your NuStep 5-7 days/week, adherence to prescribed medication regimen including new diabetes medication,  contacting provider for new or worsened symptoms or questions related to HTN, DM or HLD, CKD continue to work with RN Care Manager to address care management and care coordination needs related to HTN, HLD, and DMII , CKD Work with BSW to address concerns related to Section 8 housing  Interventions: Inter-disciplinary care team collaboration (see longitudinal plan of care) Referred to BSW per patient request to assist with Section 8 housing Evaluation of current treatment plan related to  self management and patient's adherence to plan as established by provider   Chronic Kidney Disease (Status: Goal on track: YES. Goal on track: NO.) - patient reports home BP readings are usually <140/<90 but reading prior to colonoscopy was not meeting treatment targets Last practice recorded BP readings:  BP Readings from Last 3  Encounters:  02/26/21 (!) 155/82  02/06/21 (!) 158/84  01/01/21 (!) 156/91      Most recent eGFR/CrCl:  Lab Results  Component Value Date   EGFR 55 (L) 01/02/2021    No components found for: CRCL  Reviewed medications with patient and discussed importance of compliance  ; advised him to increase atorvastatin got 40 mg daily  Assessed frequency of self monitored BP, reviewed home and provider office readings, reviewed treatment targets and reviewed lifestyle strategies      Reviewed scheduled/upcoming provider appointments including: orthopedic appointment on 03/12/21 for left hip injection, primary care follow up on 04/09/21  Discussed plans with patient for ongoing care management follow up and provided patient with direct contact information for care management team      Diabetes:  (Status: Goal on track: YES.)- patient reports self monitored fasting blood sugars 120-140 Lab Results  Component Value Date   HGBA1C 7.9 (H) 01/02/2021  Confirmed patient received DM education in mail; provided opportunity for questions Assessed frequency of CBG testing and reviewed fasting target Reviewed medications with patient and discussed importance of medication adherence  Discussed plans with patient for ongoing care management follow up and provided patient with direct contact information for care management team;      Reviewed scheduled/upcoming provider appointments including: orthopedic appointment on 03/12/21 for left hip injection, primary care follow up on 04/09/21            Health Maintenance (Status: Goal on track: YES.) - patient completed colonoscopy and endoscopy on 02/26/21 and was aware of pathology report- non cancerous Patient interviewed about adult health maintenance status   Pneumonia Vaccine- states he received PNA vaccine 4 years ago Influenza Vaccine- states he received flu shot on 02/06/21 COVID vaccination   - pt states he will receive at next primary care appointment on  04/09/21  Shingles Vaccination- received first dose on 02/06/21 Regular eye checkups- says he sees his eye doctor every 6 months due to glaucoma and his next appointment is in November  Advanced Directives- states he does not have and does not want information at this time           Hyperlipidemia:  (Status: will have lipid panel rechecked in 2 months (Nov) and statin dose increased to high intensity) Lab Results  Component Value Date   CHOL 174 01/02/2021   HDL 37 (L) 01/02/2021   LDLCALC 91 01/02/2021   TRIG 276 (H) 01/02/2021  CHOLHDL 6.1 (H) 12/20/2019     Medication review performed; medication list updated in electronic medical record.    Hypertension: (Status: Goal on track: NO.)-majority of provider office readings are not meeting treatment targets; patient attributes this to pain, patient is now self monitoring blood pressure at home, patient reports home BP readings are usually <140/<90 but reading prior to colonoscopy on 02/26/21 was not meeting treatment targets Last practice recorded BP readings:  BP Readings from Last 3 Encounters:  02/26/21 (!) 155/82  02/06/21 (!) 158/84  01/01/21 (!) 156/91     Most recent eGFR/CrCl:  Lab Results  Component Value Date   EGFR 55 (L) 01/02/2021    No components found for: CRCL  Evaluation of current treatment plan related to hypertension self management and patient's adherence to plan as established by provider;   Reviewed BP medications and assessed medication taking behavior ensuring he is taking Losartan that was recently added to his regimen Assessed frequency of self monitored blood press and readings, reviewed treatment targets Discussed plans with patient for ongoing care management follow up and provided patient with direct contact information for care management team; Reviewed scheduled/upcoming provider appointments including: colonoscopy appointment on 02/26/21, orthopedic appointment on 03/19/21 for left hip injection,  primary care follow up on 04/09/21      Pain:  (Status: Goal on track: NO.)patient states left hip pain worse and will receive injection to treat pain on 03/12/21 Pain assessment performed Medications reviewed Reviewed provider established plan for pain management; Counseled on the importance of reporting any/all new or changed pain symptoms or management strategies to pain management provider; Positive reinforcement given to patient regarding ongoing weight loss to meet goal to qualify for bilateral hip surgery  Patient Goals/Self-Care Activities: Patient will self administer medications as prescribed Patient will attend all scheduled provider appointments Patient will call pharmacy for medication refills Patient will continue to perform ADL's independently Patient will continue to perform IADL's independently Patient will call provider office for new concerns or questions Patient will work with pharmacist  to discuss any medication concerns or questions Patient will work with BSW on 03/13/21 to address Section 8 housing questions/concerns       Follow Up:  Patient agrees to Care Plan and Follow-up.  Plan: The Managed Medicaid care management team will reach out to the patient again over the next 30 days.  Date/time of next scheduled RN care management/care coordination outreach:  04/08/21 at 11:30 am  Kelli Churn RN, CCM, [place] Management Coordinator - Managed [place] High Risk [phone]

## 2021-03-12 DIAGNOSIS — M1612 Unilateral primary osteoarthritis, left hip: Secondary | ICD-10-CM | POA: Diagnosis not present

## 2021-03-12 MED ORDER — SILDENAFIL CITRATE 50 MG PO TABS: 50.0000 mg | ORAL_TABLET | Freq: Every day | ORAL | 0 refills | Status: DC | PRN

## 2021-03-13 ENCOUNTER — Other Ambulatory Visit: Payer: Self-pay

## 2021-03-13 NOTE — Patient Instructions (Signed)
Visit Information  [name] was given information about Medicaid Managed Care team care coordination services as a part of their Healthy [facility] Medicaid benefit. [name] verbally consented to engagement with the [facility] Managed Care team.   If you are experiencing a medical emergency, please call 911 or report to your local emergency department or urgent care.   If you have a non-emergency medical problem during routine business hours, please contact your provider's office and ask to speak with a nurse.   For questions related to your Healthy [facility] health plan, please call: [phone] or visit the homepage here: [email or web address]  If you would like to schedule transportation through your Healthy [facility] plan, please call the following number at least 2 days in advance of your appointment: [phone]  Call the [place] at [phone], at any time, 24 hours a day, 7 days a week. If you are in danger or need immediate medical attention call 911.  If you would like help to quit smoking, call 1-800-QUIT-NOW 520-837-4786) OR Espaol: 1-855-Djelo-Ya (4-287-681-1572) o para ms informacin haga clic aqu or Text READY to 200-400 to register via text  [name] - following are the goals we discussed in your visit today:   Goals Addressed   None       The  Patient                                              has been provided with contact information for the Managed Medicaid care management team and has been advised to call with any health related questions or concerns.   [name], BSW, [place]  High Risk Managed Medicaid Team  [phone]   Following is a copy of your plan of care:  Care Plan : [place]  Updates made by [name] since 03/13/2021 12:00 AM     Problem: Chronic Disease Management and Care Coordination Needs for  DM, HTN, HLD, CKD      Long-Range Goal: Development of Plan Of Care For Chronic Disease Management and Care Coordination Needs to Assist With Meeting Treatment Goals   Start Date: 01/07/2021  Expected End Date: 05/07/2021  Priority: High  Note:   Current Barriers:  Knowledge Deficits related to plan of care for management of HTN, HLD, and DMII, chronic pain Chronic Disease Management support and education needs related to HTN, HLD, and DMII, chronic pain  RNCM Clinical Goal(s):  Patient will verbalize understanding of plan for management of HTN, HLD, and DMII take all medications exactly as prescribed and will call provider for medication related questions attend all scheduled medical appointments: orthopedic appointment on 03/12/21 for left hip injection, primary care follow up on 04/09/21 demonstrate ongoing adherence to prescribed treatment plan for HTN, HLD, and DMII as evidenced by daily monitoring and recording of fasting CBG,  adherence to ADA/ carb modified, fat modified, no added salt diet, continue 30-40 minutes of exercise on your NuStep 5-7 days/week, adherence to prescribed medication regimen including new diabetes medication,  contacting provider for new or worsened symptoms or questions related to HTN, DM or HLD, CKD continue to work with RN Care Manager to address care management and care coordination needs related to HTN, HLD, and DMII , CKD Work with BSW to address concerns related to  Section 8 housing  Interventions: Inter-disciplinary care team collaboration (see longitudinal plan of care) Referred to BSW per patient request to assist with Section 8 housing Evaluation of current treatment plan related to  self management and patient's adherence to plan as established by provider BSW contacted patient regarding section 8. Patient stated that he has already completed all of the paperwork for section 8 but has not heard anything and it has been about a year. Patient stated he  has somewhere to live and they accept section 8. BSW informed patient the waitlist is about 2-3 years long, however he can contact [employer] to see where his name is on the waitlist. Patient stated he would call them on Monday. No other resources/services needed.   Chronic Kidney Disease (Status: Goal on track: YES. Goal on track: NO.) - patient reports home BP readings are usually <140/<90 but reading prior to colonoscopy was not meeting treatment targets Last practice recorded BP readings:  BP Readings from Last 3 Encounters:  02/26/21 (!) 155/82  02/06/21 (!) 158/84  01/01/21 (!) 156/91      Most recent eGFR/CrCl:  Lab Results  Component Value Date   EGFR 55 (L) 01/02/2021    No components found for: CRCL  Reviewed medications with patient and discussed importance of compliance  ; advised him to increase atorvastatin got 40 mg daily  Assessed frequency of self monitored BP, reviewed home and provider office readings, reviewed treatment targets and reviewed lifestyle strategies      Reviewed scheduled/upcoming provider appointments including: orthopedic appointment on 03/12/21 for left hip injection, primary care follow up on 04/09/21  Discussed plans with patient for ongoing care management follow up and provided patient with direct contact information for care management team      Diabetes:  (Status: Goal on track: YES.)- patient reports self monitored fasting blood sugars 120-140 Lab Results  Component Value Date   HGBA1C 7.9 (H) 01/02/2021  Confirmed patient received DM education in mail; provided opportunity for questions Assessed frequency of CBG testing and reviewed fasting target Reviewed medications with patient and discussed importance of medication adherence Discussed plans with patient for ongoing care management follow up and provided patient with direct contact information for care management team;      Reviewed scheduled/upcoming provider  appointments including:  orthopedic appointment on 03/12/21 for left hip injection, primary care follow up on 04/09/21            Health Maintenance (Status: Goal on track: YES.) - patient completed colonoscopy and endoscopy on 02/26/21 and was aware of pathology report- non cancerous Patient interviewed about adult health maintenance status   Pneumonia Vaccine- states he received PNA vaccine 4 years ago Influenza Vaccine- states he received flu shot on 02/06/21 COVID vaccination   - pt states he will receive at next primary care appointment on 04/09/21  Shingles Vaccination- received first dose on 02/06/21 Regular eye checkups- says he sees his eye doctor every 6 months due to glaucoma and his next appointment is in November  Advanced Directives- states he does not have and does not want information at this time           Hyperlipidemia:  (Status: will have lipid panel rechecked in 2 months (Nov) and statin dose increased to high intensity) Lab Results  Component Value Date   CHOL 174 01/02/2021   HDL 37 (L) 01/02/2021   LDLCALC 91 01/02/2021   TRIG 276 (H) 01/02/2021   CHOLHDL 6.1 (H) 12/20/2019  Medication review performed; medication list updated in electronic medical record.    Hypertension: (Status: Goal on track: NO.)-majority of provider office readings are not meeting treatment targets; patient attributes this to pain, patient is now self monitoring blood pressure at home, patient reports home BP readings are usually <140/<90 but reading prior to colonoscopy on 02/26/21 was not meeting treatment targets Last practice recorded BP readings:  BP Readings from Last 3 Encounters:  02/26/21 (!) 155/82  02/06/21 (!) 158/84  01/01/21 (!) 156/91     Most recent eGFR/CrCl:  Lab Results  Component Value Date   EGFR 55 (L) 01/02/2021    No components found for: CRCL  Evaluation of current treatment plan related to hypertension self management and patient's adherence to plan as  established by provider;   Reviewed BP medications and assessed medication taking behavior ensuring he is taking Losartan that was recently added to his regimen Assessed frequency of self monitored blood press and readings, reviewed treatment targets Discussed plans with patient for ongoing care management follow up and provided patient with direct contact information for care management team; Reviewed scheduled/upcoming provider appointments including: colonoscopy appointment on 02/26/21, orthopedic appointment on 03/19/21 for left hip injection, primary care follow up on 04/09/21      Pain:  (Status: Goal on track: NO.)patient states left hip pain worse Pain assessment performed Medications reviewed Reviewed provider established plan for pain management; Counseled on the importance of reporting any/all new or changed pain symptoms or management strategies to pain management provider; Positive reinforcement given to patient regarding ongoing weight loss to meet goal to qualify for bilateral hip surgery  Patient Goals/Self-Care Activities: Patient will self administer medications as prescribed Patient will attend all scheduled provider appointments Patient will call pharmacy for medication refills Patient will continue to perform ADL's independently Patient will continue to perform IADL's independently Patient will call provider office for new concerns or questions Patient will work with pharmacist  to discuss any medication concerns or questions Patient will work with BSW on 03/13/21 to address Section 8 housing questions/concerns

## 2021-03-13 NOTE — Patient Outreach (Signed)
Medicaid Managed Care Social Work Note  03/13/2021 Name:  [name] MRN:  [id number] DOB:  10-Aug-1970  [name] is an 50 y.o. year old male who is a primary patient of [name] Larsson, NP.  The [facility] Managed Care Coordination team was consulted for assistance with:   section 8 housing  [name] was given information about Medicaid Managed Care Coordination team services today. [name] Patient agreed to services and verbal consent obtained.  Engaged with patient  for by telephone forinitial visit in response to referral for case management and/or care coordination services.   Assessments/Interventions:  Review of past medical history, allergies, medications, health status, including review of consultants reports, laboratory and other test data, was performed as part of comprehensive evaluation and provision of chronic care management services.  SDOH: (Social Determinant of Health) assessments and interventions performed:  BSW contacted patient regarding section 8. Patient stated that he has already completed all of the paperwork for section 8 but has not heard anything and it has been about a year. Patient stated he has somewhere to live and they accept section 8. BSW informed patient the waitlist is about 2-3 years long, however he can contact [name] to see where his name is on the waitlist. Patient stated he would call them on Monday. No other resources/services needed.  Advanced Directives Status:  Not addressed in this encounter.  Care Plan                 No Known Allergies  Medications Reviewed Today     Reviewed by [name] Ellison, RN (Registered Nurse) on 03/11/21 at 48  Med List Status: <None>   Medication Order Taking? Sig Documenting Provider Last Dose Status Informant  acetaminophen (TYLENOL) 500 MG tablet [id number] No Take 1,000 mg by mouth every 6 (six) hours as needed for moderate pain. Provider, Historical, [name] 02/26/2021 0300  Active Self  atorvastatin (LIPITOR) 40 MG tablet [id number] No Take 1 tablet (40 mg total) by mouth daily. [name] Larsson, NP 02/26/2021 0500 Active            Med Note [name], [name]   Tue Feb 11, 2021  1:59 PM) Says he is still taking the 10 mg tablet; instructed him to increase to 40 mg daily per [name] order of 01/14/21  blood glucose meter kit and supplies KIT [id number] No Test daily Dx  r73.03 [name] Deutscher, DO Taking Active Self  Blood Pressure Monitor KIT [id number] No 1 each by Does not apply route daily. [name] Larsson, NP Taking Active   cyclobenzaprine (FLEXERIL) 10 MG tablet [id number] No Take 1 tablet (10 mg total) by mouth 2 (two) times daily as needed for muscle spasms. [name] Mayo, NP 02/26/2021 0500 Active Self           Med Note [name], [name]   Tue Jan 07, 2021 10:32 AM) [name] 2 in the morning and 1 at night  ferrous sulfate 324 MG TBEC [id number] No Take 324 mg by mouth every other day. Provider, Historical, [name] Past Week Active   glucose blood test strip [id number] No Use as instructed. Can check up to 4 times daily. [name] Larsson, NP Taking Active   Iron Combinations (IRON COMPLEX PO) [id number] No Take 65 mg by mouth daily. Provider, Historical, [name] Past Week Active Self  latanoprost (XALATAN) 0.005 % ophthalmic solution [id number] No Place 1 drop into both eyes at bedtime. Provider, Historical, [name] 02/25/2021  Active Self  losartan (COZAAR) 25 MG tablet [id number] No Take by mouth. Provider, Historical, [name] Taking Active   methylcellulose oral powder [id number] No Take 1 packet by mouth daily.  Patient not taking: No sig reported   Provider, Historical, [name] Not Taking Active   Multiple Vitamin (MULTIVITAMIN WITH MINERALS) TABS tablet [id number] No Take 1 tablet by mouth daily. Provider, Historical, [name] 02/26/2021 500 Active Self  NARCAN 4 MG/0.1ML LIQD nasal spray kit [id number] No Place 0.4 mg into the nose once. Provider, Historical, [name] Taking Active Self  Oxycodone HCl 20 MG TABS  [id number] No Take 20 mg by mouth every 6 (six) hours as needed (pain). Provider, Historical, [name] 02/26/2021 0500 Active Self           Med Note [name]   Tue Jan 07, 2021 10:36 AM) [name]  Semaglutide, 1 MG/DOSE, 4 MG/3ML SOPN [id number] No Inject 1 mg as directed once a week. [name] Larsson, NP Taking Active            Med Note [name]   Tue Jan 07, 2021 11:39 AM) waiting for approval  Semaglutide,0.25 or 0.5MG/DOS, (OZEMPIC, 0.25 OR 0.5 MG/DOSE,) 2 MG/1.5ML SOPN [id number]  Inject 0.5 mg into the skin once a week. [name] Larsson, NP  Active   sildenafil (VIAGRA) 50 MG tablet [id number] No Take 1 tablet (50 mg total) by mouth daily as needed for erectile dysfunction.  Patient not taking: Reported on 02/06/2021   [name] Mayo, NP Not Taking Active Self           Med Note [name]   Tue Jan 07, 2021 10:37 AM) Not taking   timolol (BETIMOL) 0.5 % ophthalmic solution [id number] No Place 1 drop into both eyes in the morning. Provider, Historical, [name] 02/26/2021 0500 Active Self            Patient Active Problem List   Diagnosis Date Noted   Immunization due 02/06/2021   Iron deficiency anemia 12/30/2020   History of colonic polyps 12/30/2020   Unilateral primary osteoarthritis, right hip 08/13/2020   Unilateral primary osteoarthritis, left hip 08/13/2020   Muscle spasms of both lower extremities 05/23/2020   Need for immunization against influenza 03/06/2020   Primary osteoarthritis of both hips 07/10/2019   Bilateral primary osteoarthritis of hip 07/10/2019   Vitamin D deficiency 04/13/2019   Morbid obesity with body mass index (BMI) of 40.0 to 44.9 in adult ([place]) 04/13/2019   Depressive disorder 04/13/2019   Body mass index (BMI) 45.0-49.9, adult ([place]) 04/13/2019   Kidney disease, chronic, stage III (GFR 30-59 ml/min) ([place]) 07/20/2017   History of cerebrovascular accident 07/20/2017   Gastroesophageal reflux disease 07/20/2017   Chronic kidney disease, stage  3 unspecified ([place]) 07/20/2017   Gastric bypass status for obesity 02/11/2017   Benzodiazepine dependence ([place]) 02/11/2017   Type 2 diabetes mellitus with diabetic nephropathy ([place]) 08/03/2008   Hyperlipidemia 08/03/2008   SLEEP APNEA 08/03/2008   Sleep apnea 08/03/2008    Conditions to be addressed/monitored per PCP order:   section 8 housing  Care Plan : [place]  Updates made by Ethelda Chick since 03/13/2021 12:00 AM     Problem: Chronic Disease Management and Care Coordination Needs for DM, HTN, HLD, CKD      Long-Range Goal: Development of Plan Of Care For Chronic Disease Management and Care Coordination Needs to Assist With Meeting Treatment Goals   Start Date: 01/07/2021  Expected End Date: 05/07/2021  Priority: High  Note:   Current Barriers:  Knowledge Deficits related to plan of care for management of HTN, HLD, and DMII, chronic pain Chronic Disease Management support and education needs related to HTN, HLD, and DMII, chronic pain  RNCM Clinical Goal([name]):  Patient will verbalize understanding of plan for management of HTN, HLD, and DMII take all medications exactly as prescribed and will call provider for medication related questions attend all scheduled medical appointments: orthopedic appointment on 03/12/21 for left hip injection, primary care follow up on 04/09/21 demonstrate ongoing adherence to prescribed treatment plan for HTN, HLD, and DMII as evidenced by daily monitoring and recording of fasting CBG,  adherence to ADA/ carb modified, fat modified, no added salt diet, continue 30-40 minutes of exercise on your NuStep 5-7 days/week, adherence to prescribed medication regimen including new diabetes medication,  contacting provider for new or worsened symptoms or questions related to HTN, DM or HLD, CKD continue to work with RN Care Manager to address care management and care coordination needs related to HTN, HLD, and DMII , CKD Work with BSW  to address concerns related to Section 8 housing  Interventions: Inter-disciplinary care team collaboration (see longitudinal plan of care) Referred to BSW per patient request to assist with Section 8 housing Evaluation of current treatment plan related to  self management and patient'[name] adherence to plan as established by provider BSW contacted patient regarding section 8. Patient stated that he has already completed all of the paperwork for section 8 but has not heard anything and it has been about a year. Patient stated he has somewhere to live and they accept section 8. BSW informed patient the waitlist is about 2-3 years long, however he can contact [name] to see where his name is on the waitlist. Patient stated he would call them on Monday. No other resources/services needed.   Chronic Kidney Disease (Status: Goal on track: YES. Goal on track: NO.) - patient reports home BP readings are usually <140/<90 but reading prior to colonoscopy was not meeting treatment targets Last practice recorded BP readings:  BP Readings from Last 3 Encounters:  02/26/21 (!) 155/82  02/06/21 (!) 158/84  01/01/21 (!) 156/91      Most recent eGFR/CrCl:  Lab Results  Component Value Date   EGFR 55 (L) 01/02/2021    No components found for: CRCL  Reviewed medications with patient and discussed importance of compliance  ; advised him to increase atorvastatin got 40 mg daily  Assessed frequency of self monitored BP, reviewed home and provider office readings, reviewed treatment targets and reviewed lifestyle strategies      Reviewed scheduled/upcoming provider appointments including: orthopedic appointment on 03/12/21 for left hip injection, primary care follow up on 04/09/21  Discussed plans with patient for ongoing care management follow up and provided patient with direct contact information for care management team      Diabetes:  (Status: Goal on track: YES.)- patient reports self  monitored fasting blood sugars 120-140 Lab Results  Component Value Date   HGBA1C 7.9 (H) 01/02/2021  Confirmed patient received DM education in mail; provided opportunity for questions Assessed frequency of CBG testing and reviewed fasting target Reviewed medications with patient and discussed importance of medication adherence Discussed plans with patient for ongoing care management follow up and provided patient with direct contact information for care management team;      Reviewed scheduled/upcoming provider appointments including:  orthopedic appointment on 03/12/21 for left  hip injection, primary care follow up on 04/09/21            Health Maintenance (Status: Goal on track: YES.) - patient completed colonoscopy and endoscopy on 02/26/21 and was aware of pathology report- non cancerous Patient interviewed about adult health maintenance status   Pneumonia Vaccine- states he received PNA vaccine 4 years ago Influenza Vaccine- states he received flu shot on 02/06/21 COVID vaccination   - pt states he will receive at next primary care appointment on 04/09/21  Shingles Vaccination- received first dose on 02/06/21 Regular eye checkups- says he sees his eye doctor every 6 months due to glaucoma and his next appointment is in November  Advanced Directives- states he does not have and does not want information at this time           Hyperlipidemia:  (Status: will have lipid panel rechecked in 2 months (Nov) and statin dose increased to high intensity) Lab Results  Component Value Date   CHOL 174 01/02/2021   HDL 37 (L) 01/02/2021   LDLCALC 91 01/02/2021   TRIG 276 (H) 01/02/2021   CHOLHDL 6.1 (H) 12/20/2019     Medication review performed; medication list updated in electronic medical record.    Hypertension: (Status: Goal on track: NO.)-majority of provider office readings are not meeting treatment targets; patient attributes this to pain, patient is now self monitoring blood  pressure at home, patient reports home BP readings are usually <140/<90 but reading prior to colonoscopy on 02/26/21 was not meeting treatment targets Last practice recorded BP readings:  BP Readings from Last 3 Encounters:  02/26/21 (!) 155/82  02/06/21 (!) 158/84  01/01/21 (!) 156/91     Most recent eGFR/CrCl:  Lab Results  Component Value Date   EGFR 55 (L) 01/02/2021    No components found for: CRCL  Evaluation of current treatment plan related to hypertension self management and patient'[name] adherence to plan as established by provider;   Reviewed BP medications and assessed medication taking behavior ensuring he is taking Losartan that was recently added to his regimen Assessed frequency of self monitored blood press and readings, reviewed treatment targets Discussed plans with patient for ongoing care management follow up and provided patient with direct contact information for care management team; Reviewed scheduled/upcoming provider appointments including: colonoscopy appointment on 02/26/21, orthopedic appointment on 03/19/21 for left hip injection, primary care follow up on 04/09/21      Pain:  (Status: Goal on track: NO.)patient states left hip pain worse Pain assessment performed Medications reviewed Reviewed provider established plan for pain management; Counseled on the importance of reporting any/all new or changed pain symptoms or management strategies to pain management provider; Positive reinforcement given to patient regarding ongoing weight loss to meet goal to qualify for bilateral hip surgery  Patient Goals/Self-Care Activities: Patient will self administer medications as prescribed Patient will attend all scheduled provider appointments Patient will call pharmacy for medication refills Patient will continue to perform ADL'[name] independently Patient will continue to perform IADL'[name] independently Patient will call provider office for new concerns or questions Patient  will work with pharmacist  to discuss any medication concerns or questions Patient will work with BSW on 03/13/21 to address Section 8 housing questions/concerns       Follow up:  Patient agrees to Care Plan and Follow-up.  Plan: The  Patient has been provided with contact information for the Managed Medicaid care management team and has been advised to call with any health related questions  or concerns.    Mickel Fuchs, BSW, [place] Managed Medicaid Team  [phone]

## 2021-03-26 DIAGNOSIS — I129 Hypertensive chronic kidney disease with stage 1 through stage 4 chronic kidney disease, or unspecified chronic kidney disease: Secondary | ICD-10-CM | POA: Diagnosis not present

## 2021-03-26 DIAGNOSIS — E1122 Type 2 diabetes mellitus with diabetic chronic kidney disease: Secondary | ICD-10-CM | POA: Diagnosis not present

## 2021-03-26 DIAGNOSIS — E1129 Type 2 diabetes mellitus with other diabetic kidney complication: Secondary | ICD-10-CM | POA: Diagnosis not present

## 2021-03-26 DIAGNOSIS — R809 Proteinuria, unspecified: Secondary | ICD-10-CM | POA: Diagnosis not present

## 2021-03-26 DIAGNOSIS — I5032 Chronic diastolic (congestive) heart failure: Secondary | ICD-10-CM | POA: Diagnosis not present

## 2021-03-26 DIAGNOSIS — N189 Chronic kidney disease, unspecified: Secondary | ICD-10-CM | POA: Diagnosis not present

## 2021-04-02 ENCOUNTER — Other Ambulatory Visit: Payer: Self-pay | Admitting: Nurse Practitioner

## 2021-04-02 DIAGNOSIS — I129 Hypertensive chronic kidney disease with stage 1 through stage 4 chronic kidney disease, or unspecified chronic kidney disease: Secondary | ICD-10-CM | POA: Diagnosis not present

## 2021-04-02 DIAGNOSIS — N189 Chronic kidney disease, unspecified: Secondary | ICD-10-CM | POA: Diagnosis not present

## 2021-04-02 DIAGNOSIS — E1122 Type 2 diabetes mellitus with diabetic chronic kidney disease: Secondary | ICD-10-CM | POA: Diagnosis not present

## 2021-04-02 DIAGNOSIS — E1129 Type 2 diabetes mellitus with other diabetic kidney complication: Secondary | ICD-10-CM | POA: Diagnosis not present

## 2021-04-02 DIAGNOSIS — R809 Proteinuria, unspecified: Secondary | ICD-10-CM | POA: Diagnosis not present

## 2021-04-02 DIAGNOSIS — I5032 Chronic diastolic (congestive) heart failure: Secondary | ICD-10-CM | POA: Diagnosis not present

## 2021-04-03 MED ORDER — OZEMPIC (0.25 OR 0.5 MG/DOSE) 2 MG/1.5ML ~~LOC~~ SOPN: 0.5000 mg | PEN_INJECTOR | SUBCUTANEOUS | 0 refills | Status: DC

## 2021-04-04 ENCOUNTER — Other Ambulatory Visit: Payer: Self-pay | Admitting: Internal Medicine

## 2021-04-04 DIAGNOSIS — E1121 Type 2 diabetes mellitus with diabetic nephropathy: Secondary | ICD-10-CM

## 2021-04-04 MED ORDER — SEMAGLUTIDE (1 MG/DOSE) 4 MG/3ML ~~LOC~~ SOPN: 1.0000 mg | PEN_INJECTOR | SUBCUTANEOUS | 2 refills | Status: DC

## 2021-04-09 ENCOUNTER — Ambulatory Visit: Payer: Medicaid Other | Admitting: Nurse Practitioner

## 2021-04-11 ENCOUNTER — Telehealth: Payer: Self-pay | Admitting: *Deleted

## 2021-04-11 ENCOUNTER — Ambulatory Visit: Payer: Self-pay

## 2021-04-11 NOTE — Patient Outreach (Signed)
Care Coordination  04/11/2021  [name] [birth year] [id number]  04/11/2021 Name: [name] MRN: [id number] DOB: 03/20/1971  Referred by: [name], NP Reason for referral : High Risk Managed Medicaid (Unsuccessful RNCM follow up call)   An unsuccessful telephone outreach was attempted today. The patient was referred to the case management team for assistance with care management and care coordination.  Numerous attempts to contact patient via his contact number but call would not go through and recording states " your call cannot be completed at this time,please try your call again later."  Follow Up Plan: The Managed Medicaid care management team will reach out to the patient again over the next 7-14 days.    Kelli Churn RN, CCM, [facility] Care Management Coordinator - Managed [place] High Risk [phone]

## 2021-04-12 ENCOUNTER — Other Ambulatory Visit: Payer: Self-pay | Admitting: Nurse Practitioner

## 2021-04-12 ENCOUNTER — Other Ambulatory Visit: Payer: Self-pay | Admitting: Family Medicine

## 2021-04-12 DIAGNOSIS — M62838 Other muscle spasm: Secondary | ICD-10-CM

## 2021-04-14 ENCOUNTER — Other Ambulatory Visit: Payer: Self-pay

## 2021-04-14 ENCOUNTER — Other Ambulatory Visit: Payer: Self-pay | Admitting: *Deleted

## 2021-04-14 ENCOUNTER — Other Ambulatory Visit: Payer: Self-pay | Admitting: Family Medicine

## 2021-04-14 DIAGNOSIS — M62838 Other muscle spasm: Secondary | ICD-10-CM

## 2021-04-14 MED ORDER — CYCLOBENZAPRINE HCL 10 MG PO TABS: 10.0000 mg | ORAL_TABLET | Freq: Two times a day (BID) | ORAL | 0 refills | Status: DC | PRN

## 2021-04-14 MED ORDER — LOSARTAN POTASSIUM 25 MG PO TABS
25.0000 mg | ORAL_TABLET | Freq: Every day | ORAL | 0 refills | Status: DC
  Filled 2021-04-14: qty 90, 90d supply, fill #0

## 2021-04-14 NOTE — Patient Outreach (Signed)
Medicaid Managed Care   Nurse Care Manager Note  04/14/2021 Name:  [name] MRN:  [id number] DOB:  03-26-1971  [name] is an 50 y.o. year old male who is a primary patient of [name] Larsson, NP.  The [facility] Managed Care Coordination team was consulted for assistance with:    DM HTN HLD CKD Stage3B Obesity  [name] was given information about Medicaid Managed Care Coordination team services today. [name] Patient agreed to services and verbal consent obtained.  Engaged with patient by telephone for follow up visit in response to provider referral for case management and/or care coordination services.   Assessments/Interventions:  Review of past medical history, allergies, medications, health status, including review of consultants reports, laboratory and other test data, was performed as part of comprehensive evaluation and provision of chronic care management services.  SDOH (Social Determinants of Health) assessments and interventions performed:   Care Plan  No Known Allergies  Medications Reviewed Today     Reviewed by [name], RN (Registered Nurse) on 04/14/21 at [place] List Status: <None>   Medication Order Taking? Sig Documenting Provider Last Dose Status Informant  acetaminophen (TYLENOL) 500 MG tablet [id number] No Take 1,000 mg by mouth every 6 (six) hours as needed for moderate pain. [provider] 02/26/2021 0300 Active Self  atorvastatin (LIPITOR) 40 MG tablet [id number] No Take 1 tablet (40 mg total) by mouth daily. [name] Larsson, NP 02/26/2021 0500 Active            Med Note [name], [name]   Tue Feb 11, 2021  1:59 PM) Says he is still taking the 10 mg tablet; instructed him to increase to 40 mg daily per MD order of 01/14/21  blood glucose meter kit and supplies KIT [id number] No Test daily Dx  r73.03 [name], DO Taking Active Self  Blood Pressure Monitor KIT [id number] No 1 each by Does not apply route daily. [name] Larsson, NP Taking Active   cyclobenzaprine (FLEXERIL) 10 MG tablet [id number] No Take 1 tablet (10 mg total) by mouth 2 (two) times daily as needed for muscle spasms. Perlie Mayo, NP 02/26/2021 0500 Active Self           Med Note [name], Zikeria Keough [name]   Tue Jan 07, 2021 10:32 AM) [name] 2 in the morning and 1 at night  ferrous sulfate 324 MG TBEC [id number] No Take 324 mg by mouth every other day. [provider] Past Week Active   glucose blood test strip [id number] No Use as instructed. Can check up to 4 times daily. [name] Larsson, NP Taking Active   Iron Combinations (IRON COMPLEX PO) [id number] No Take 65 mg by mouth daily. [provider] Past Week Active Self  latanoprost (XALATAN) 0.005 % ophthalmic solution [id number] No Place 1 drop into both eyes at bedtime. [provider] 02/25/2021 Active Self  losartan (COZAAR) 25 MG tablet [id number] No Take by mouth. [provider] Taking Active   methylcellulose oral powder [id number] No Take 1 packet by mouth daily.  Patient not taking: No sig reported   [provider] Not Taking Active   Multiple Vitamin (MULTIVITAMIN WITH MINERALS) TABS tablet [id number] No Take 1 tablet by mouth daily. [provider] 02/26/2021 500 Active Self  NARCAN 4 MG/0.1ML LIQD nasal spray kit [id number] No Place 0.4 mg into the nose once. [provider] Taking Active Self  Oxycodone HCl 20 MG TABS  [id number] No Take 20 mg by mouth every 6 (six) hours as needed (pain). [provider] 02/26/2021 0500 Active Self           Med Note [name], [name]   Tue Jan 07, 2021 10:36 AM) [name] prn  Semaglutide, 1 MG/DOSE, 4 MG/3ML SOPN [id number]  Inject 1 mg as directed once a week. [name], MD  Active   sildenafil (VIAGRA) 50 MG tablet [id number]  Take 1 tablet (50 mg total) by mouth daily as needed for erectile dysfunction. [name], MD  Active   timolol (BETIMOL) 0.5 % ophthalmic solution [id number] No  Place 1 drop into both eyes in the morning. [provider] 02/26/2021 0500 Active Self            Patient Active Problem List   Diagnosis Date Noted   Immunization due 02/06/2021   Iron deficiency anemia 12/30/2020   History of colonic polyps 12/30/2020   Unilateral primary osteoarthritis, right hip 08/13/2020   Unilateral primary osteoarthritis, left hip 08/13/2020   Muscle spasms of both lower extremities 05/23/2020   Need for immunization against influenza 03/06/2020   Primary osteoarthritis of both hips 07/10/2019   Bilateral primary osteoarthritis of hip 07/10/2019   Vitamin D deficiency 04/13/2019   Morbid obesity with body mass index (BMI) of 40.0 to 44.9 in adult ([place]) 04/13/2019   Depressive disorder 04/13/2019   Body mass index (BMI) 45.0-49.9, adult ([place]) 04/13/2019   Kidney disease, chronic, stage III (GFR 30-59 ml/min) ([place]) 07/20/2017   History of cerebrovascular accident 07/20/2017   Gastroesophageal reflux disease 07/20/2017   Chronic kidney disease, stage 3 unspecified ([place]) 07/20/2017   Gastric bypass status for obesity 02/11/2017   Benzodiazepine dependence ([place]) 02/11/2017   Type 2 diabetes mellitus with diabetic nephropathy ([place]) 08/03/2008   Hyperlipidemia 08/03/2008   SLEEP APNEA 08/03/2008   Sleep apnea 08/03/2008     Care Plan : RN Care Manager Plan Of Care  Updates made by [name], RN since 04/14/2021 12:00 AM     Problem: Chronic Disease Management and Care Coordination Needs for DM, HTN, HLD, CKD      Long-Range Goal: Development of Plan Of Care For Chronic Disease Management and Care Coordination Needs to Assist With Meeting Treatment Goals   Start Date: 01/07/2021  Expected End Date: 05/07/2021  Priority: High  Note:   Current Barriers:  Knowledge Deficits related to plan of care for management of HTN, HLD, and DMII, chronic pain- patient states he continues to receive steroid shots in hips alternating  each month and  requires oxycodone to manage pain Chronic Disease Management support and education needs related to HTN, HLD, and DMII, chronic pain  RNCM Clinical Goal([name]):  Patient will verbalize understanding of plan for management of HTN, HLD, and DMII take all medications exactly as prescribed and will call provider for medication related questions attend all scheduled medical appointments: primary care follow up on 12/13//22 demonstrate ongoing adherence to prescribed treatment plan for HTN, HLD, and DMII as evidenced by daily monitoring and recording of fasting CBG,  adherence to ADA/ carb modified, fat modified, no added salt diet, continue 30-40 minutes of exercise on your NuStep 5-7 days/week, adherence to prescribed medication regimen including new diabetes medication,  contacting provider for new or worsened symptoms or questions related to HTN, DM or HLD, CKD continue to work with RN Care Manager to address care management and care coordination needs related to HTN, HLD, and DMII , CKD  Interventions: Inter-disciplinary care team collaboration (see longitudinal plan of care) Evaluation of current treatment plan related to  self management and patient'[name] adherence to plan as established by provider Mailed "Planning Healthy Meals" brochure to patient to assist with meeting weight loss goal so that he can proceed with hip replacement surgery   Chronic Kidney Disease (Status: Goal on track: NO.) - patient reports he is monitoring his BP at home about once monthly and that readings are usually <140/<90, he says provider office readings are higher because he is in pain from the walk to the offices. 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 Last practice recorded BP readings:  BP Readings from Last 3 Encounters:  02/26/21 (!) 155/82  02/06/21 (!) 158/84  01/01/21 (!) 156/91       Most recent eGFR/CrCl:  Lab Results  Component Value Date   EGFR 55  (L) 01/02/2021    No components found for: CRCL  Renal Function Panel Specimen:  Blood - Blood, Venous  Ref Range & Units 2 wk ago Comments  Glucose 65 - 99 mg/dL 102 High               Fasting reference interval   For someone without known diabetes, a glucose value  between 100 and 125 mg/dL is consistent with  prediabetes and should be confirmed with a  follow-up test.  BUN 7 - 25 mg/dL 15    Creatinine 0.70 - 1.30 mg/dL 1.56 High     eGFR CKD-EPI CR 2021 > OR = 60 mL/min/1.20m 53 Low   The eGFR is based on the CKD-EPI 2021 equation. To calculate  the new eGFR from a previous Creatinine or Cystatin C  result, go to https://www.kidney.org/professionals/  kdoqi/gfr%5Fcalculator  BUN/Creatinine Ratio 6 - 22 (calc) 10    Sodium 135 - 146 mmol/L 139    Potassium 3.5 - 5.3 mmol/L 3.7    Chloride 98 - 110 mmol/L 103    Bicarbonate (CO2) 20 - 32 mmol/L 24    Calcium 8.6 - 10.3 mg/dL 9.2    Phosphorus 2.5 - 4.5 mg/dL 3.1    Albumin 3.6 - 5.1 g/dL 3.6    Resulting Agency  [facility]   Narrative Performed by [facility] FASTING:YES   FASTING: YES Resulting Agency Comment  Performing Organization Information:      Site ID: [id number]      Name: [facility]-      Address: [address] [address]     Director: JCathleen FearsHessling Specimen Collected: 03/26/21 10:46 Last Resulted: 03/27/21 01:53  [facility]  Result Received: 04/03/21 08:13    Reviewed medications with patient and discussed importance of compliance   Discussed appointment with nephrologist on 04/02/21 including increase in losartan to 25 mg daily Assessed frequency of self monitored BP, reviewed home and provider office readings, reviewed treatment targets and reviewed lifestyle strategies      Reviewed scheduled/upcoming provider appointments including:  primary care follow up on 05/06/21  Discussed plans with patient for ongoing care management follow up  and provided patient with direct contact information for care management team      Diabetes:  (Status: Goal on track: YES.)- patient reports self monitored fasting blood sugars 125-130; 150'[name] if he eats sweets Lab Results  Component Value Date   HGBA1C 7.9 (H) 01/02/2021  Assessed frequency of CBG testing and reviewed fasting target Reviewed medications with patient and  discussed importance of medication adherence Discussed plans with patient for ongoing care management follow up and provided patient with direct contact information for care management team;      Reviewed scheduled/upcoming provider appointments including:  primary care follow up on 05/06/21            Health Maintenance (Status: Condition stable. Not addressed this visit.) - patient completed colonoscopy and endoscopy on 02/26/21 and was aware of pathology report- non cancerous Patient interviewed about adult health maintenance status   Pneumonia Vaccine- states he received PNA vaccine 4 years ago Influenza Vaccine- states he received flu shot on 02/06/21 COVID vaccination   - pt states he will receive at next primary care appointment on 05/06/21  Shingles Vaccination- received first dose on 02/06/21 Regular eye checkups- says he sees his eye doctor every 6 months due to glaucoma and his next appointment is in November  Advanced Directives- states he does not have and does not want information at this time           Hyperlipidemia:  (Status: Condition stable; Not addressed at this visit will have lipid panel rechecked in Dec Lab Results  Component Value Date   CHOL 174 01/02/2021   HDL 37 (L) 01/02/2021   LDLCALC 91 01/02/2021   TRIG 276 (H) 01/02/2021   CHOLHDL 6.1 (H) 12/20/2019     Medication review performed; medication list updated in electronic medical record.    Hypertension: (Status: Goal on track: NO.)-majority of provider office readings are not meeting treatment targets; patient attributes this to pain,  reports his monthly self monitored BP readings are <140/<90 Last practice recorded BP readings:  BP Readings from Last 3 Encounters:  02/26/21 (!) 155/82  02/06/21 (!) 158/84  01/01/21 (!) 156/91     Most recent eGFR/CrCl:  Lab Results  Component Value Date   EGFR 55 (L) 01/02/2021    No components found for: CRCL  Evaluation of current treatment plan related to hypertension self management and patient'[name] adherence to plan as established by provider;   Reviewed BP medications and assessed medication taking behavior ensuring he is taking Losartan that was recently added to his regimen Assessed frequency of self monitored blood press and readings, reviewed treatment targets Discussed plans with patient for ongoing care management follow up and provided patient with direct contact information for care management team; Reviewed scheduled/upcoming provider appointments including: colonoscopy appointment on 02/26/21, orthopedic appointment on 03/19/21 for left hip injection, primary care follow up on 04/09/21      Pain:  (Status: Goal on track: NO.)patient states bilateral hip pain continues and requires regular injections and oxycodone to control Medications reviewed Reviewed provider established plan for pain management; Counseled on the importance of reporting any/all new or changed pain symptoms or management strategies to pain management provider; Positive reinforcement given to patient regarding ongoing weight loss to meet goal to qualify for bilateral hip surgery- advised patient this RNCM will message priamry care provider about increasing semaglutide to 2 mg weekly to assist with weight loss and decrease sweet cravings   Patient Goals/Self-Care Activities: Patient will self administer medications as prescribed Patient will attend all scheduled provider appointments Patient will call pharmacy for medication refills Patient will continue to perform ADL'[name] independently Patient will  continue to perform IADL'[name] independently Patient will call provider office for new concerns or questions Patient will work with pharmacist  to discuss any medication concerns or questions Patient will review "Planning Healthy Meals" brochure mailed to him on 04/14/21  Follow Up:  Patient agrees to Care Plan and Follow-up.  Plan: The Managed Medicaid care management team will reach out to the patient again over the next 30 days.  Date/time of next scheduled RN care management/care coordination outreach:  05/13/21 at 2:30 pm  Kelli Churn RN, CCM, [place] Management Coordinator - Managed [place] High Risk [phone]

## 2021-04-14 NOTE — Patient Instructions (Addendum)
Visit Information  Mr. Hedeen was given information about Medicaid Managed Care team care coordination services as a part of their Healthy [facility] Medicaid benefit. [name] verbally consented to engagement with the [facility] Managed Care team.   If you are experiencing a medical emergency, please call 911 or report to your local emergency department or urgent care.   If you have a non-emergency medical problem during routine business hours, please contact your provider's office and ask to speak with a nurse.   For questions related to your Healthy [facility] health plan, please call: [phone] or visit the homepage here: [email or web address]  If you would like to schedule transportation through your Healthy [facility] plan, please call the following number at least 2 days in advance of your appointment: [phone]  Call the Early at [phone], at any time, 24 hours a day, 7 days a week. If you are in danger or need immediate medical attention call 911.  If you would like help to quit smoking, call 1-800-QUIT-NOW 571-476-4020) OR Espaol: 1-855-Djelo-Ya (7-253-664-4034) o para ms informacin haga clic aqu or Text READY to 200-400 to register via text  Mr. Bagot - following are the goals we discussed in your visit today:   Goals Addressed             This Visit's Progress    COMPLETED: Cope with Chronic Pain       Timeframe:  Long-Range Goal Priority:  Medium Start Date:  01/07/21                           Expected End Date:      ongoing                 Follow Up Date 04/11/21    - use distraction techniques -take pain medicine when pain level is 6 or less - review article received in mail entitled " Chronic Pain"  -continue to lose weight in order for qualify for bilateral hip surgery    Why is this important?   Stress makes chronic pain feel worse.  Feelings like depression, anxiety, stress and  anger can make your body more sensitive to pain.  Learning ways to cope with stress or depression may help you find some relief from the pain.     Notes: 04/14/21 -  Resolving due to duplicate goal      COMPLETED: Exercise 150 minutes per week (moderate activity)       COMPLETED: Make and Keep All Appointments       Timeframe:  Long-Range Goal Priority:  High Start Date:        01/07/21                     Expected End Date:   ongoing                    Follow Up Date 04/11/21    - arrange a ride through an agency 1 week before appointment - ask family or friend for a ride - call to cancel if needed - keep a calendar with appointment dates in the [place] [facility] spiral bound calendar received in mail   Why is this important?   Part of staying healthy is seeing the doctor for follow-up care.  If you forget your appointments, there are some things you can do to stay on track.    Notes:04/14/21  Resolving due to duplicate goal       COMPLETED: Monitor and Manage My Blood Sugar-Diabetes Type 2       Timeframe:  Long-Range Goal Priority:  High Start Date:      01/07/21                       Expected End Date:      ongoing                 Follow Up Date 04/11/21    - check blood sugar daily prior to breakfast with goal of <130  - take the blood sugar log to all doctor visits - take the blood sugar meter to all doctor visits  - review educational material entitled " Diabetes Mellitus Basics "received in mail   Why is this important?   Checking your blood sugar at home helps to keep it from getting very high or very low.  Writing the results in a diary or log helps the doctor know how to care for you.  Your blood sugar log should have the time, date and the results.  Also, write down the amount of insulin or other medicine that you take.  Other information, like what you ate, exercise done and how you were feeling, will also be helpful.     Notes: 04/14/21-  Resolving due to duplicate goal      COMPLETED: Track and Manage My Blood Pressure-Hypertension       Timeframe:  Long-Range Goal Priority:  High Start Date:         01/07/21                    Expected End Date:     ongoing                  Follow Up Date 04/11/21    - pick up Rx for blood pressure monitor at primary care provider's office and take to [facility] - ask for help in using the monitor if you need it - check blood pressure weekly - write blood pressure results in a log or diary  - review educational article on managing high blood pressure entitled "Hypertension" received in the mail - if blood pressure does not meet target of <130/<80; please notify your provider    Why is this important?   You won't feel high blood pressure, but it can still hurt your blood vessels.  High blood pressure can cause heart or kidney problems. It can also cause a stroke.  Making lifestyle changes like losing a little weight or eating less salt will help.  Checking your blood pressure at home and at different times of the day can help to control blood pressure.  If the doctor prescribes medicine remember to take it the way the doctor ordered.  Call the office if you cannot afford the medicine or if there are questions about it.     Notes: 04/14/21- Resolving due to duplicate goal      COMPLETED: Weight (lb) < 265 lb (120.2 kg)          Please see education materials related to meal planning provided as print materials.   The patient verbalized understanding of instructions provided today and declined a print copy of patient instruction materials.   The Managed Medicaid care management team will reach out to the patient again over the next 30 days.   Kelli Churn RN, CCM, CDCES  [place] Management Coordinator - Managed [place] High Risk [phone]   Following is a copy of your plan of care:  Care Plan : [place]   Updates made by [name], RN since 04/14/2021 12:00 AM     Problem: Chronic Disease Management and Care Coordination Needs for DM, HTN, HLD, CKD      Long-Range Goal: Development of Plan Of Care For Chronic Disease Management and Care Coordination Needs to Assist With Meeting Treatment Goals   Start Date: 01/07/2021  Expected End Date: 05/07/2021  Priority: High  Note:   Current Barriers:  Knowledge Deficits related to plan of care for management of HTN, HLD, and DMII, chronic pain- patient states he continues to receive steroid shots in hips alternating  each month and requires oxycodone to manage pain Chronic Disease Management support and education needs related to HTN, HLD, and DMII, chronic pain  RNCM Clinical Goal(s):  Patient will verbalize understanding of plan for management of HTN, HLD, and DMII take all medications exactly as prescribed and will call provider for medication related questions attend all scheduled medical appointments: primary care follow up on 12/13//22 demonstrate ongoing adherence to prescribed treatment plan for HTN, HLD, and DMII as evidenced by daily monitoring and recording of fasting CBG,  adherence to ADA/ carb modified, fat modified, no added salt diet, continue 30-40 minutes of exercise on your NuStep 5-7 days/week, adherence to prescribed medication regimen including new diabetes medication,  contacting provider for new or worsened symptoms or questions related to HTN, DM or HLD, CKD continue to work with RN Care Manager to address care management and care coordination needs related to HTN, HLD, and DMII , CKD  Interventions: Inter-disciplinary care team collaboration (see longitudinal plan of care) Evaluation of current treatment plan related to  self management and patient's adherence to plan as established by provider   Chronic Kidney Disease (Status: Goal on track: NO.) - patient reports he is monitoring his BP at home about once monthly  and that readings are usually <140/<90, he says provider office readings are higher because he is in pain from the walk to the offices. 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  BUN 7 - 25 mg/dL 15    Creatinine 0.70 - 1.30 mg/dL 1.56 High     eGFR CKD-EPI CR 2021 > OR = 60 mL/min/1.84m 53 Low   The eGFR is based on the CKD-EPI 2021 equation. To calculate  the new eGFR from a previous Creatinine or Cystatin C  result, go to https://www.kidney.org/professionals/  kdoqi/[email or web address]  BUN/Creatinine Ratio 6 - 22 (calc) 10    Sodium 135 - 146 mmol/L 139  Potassium 3.5 - 5.3 mmol/L 3.7    Chloride 98 - 110 mmol/L 103    Bicarbonate (CO2) 20 - 32 mmol/L 24    Calcium 8.6 - 10.3 mg/dL 9.2    Phosphorus 2.5 - 4.5 mg/dL 3.1    Albumin 3.6 - 5.1 g/dL 3.6    Resulting Agency  [facility]   Narrative Performed by [facility] FASTING:YES   FASTING: YES Resulting Agency Comment  Performing Organization Information:      Site ID: [id number]      Name: [facility]      Address: [address] [place], [place] [address]      Director: Cathleen Fears [place] Specimen Collected: 03/26/21 10:46 Last Resulted: 03/27/21 01:53  Received From: [facility]  Result Received: 04/03/21 08:13    Reviewed medications with patient and  discussed importance of compliance   Discussed appointment with nephrologist on 04/02/21 including increase in losartan to 25 mg daily Assessed frequency of self monitored BP, reviewed home and provider office readings, reviewed treatment targets and reviewed lifestyle strategies      Reviewed scheduled/upcoming provider appointments including:  primary care follow up on 05/06/21  Discussed plans with patient for ongoing care management follow up and provided patient with direct contact information for care management team      Diabetes:  (Status: Goal on track: YES.)- patient reports self monitored fasting blood sugars 125-130; 150's if he eats sweets Lab Results  Component Value Date   HGBA1C 7.9 (H) 01/02/2021  Assessed frequency of CBG testing and reviewed fasting target Reviewed medications with patient and discussed importance of medication adherence Discussed plans with patient for ongoing care management follow up and provided patient with direct contact information for care management team;      Reviewed scheduled/upcoming provider appointments including:  primary care follow up on 05/06/21            Health Maintenance (Status: Condition stable. Not addressed this visit.) - patient completed colonoscopy and endoscopy on 02/26/21 and was aware of pathology report- non cancerous Patient interviewed about adult health maintenance status   Pneumonia Vaccine- states he received PNA vaccine 4 years ago Influenza Vaccine- states he received flu shot on 02/06/21 COVID vaccination   - pt states he will receive at next primary care appointment on 05/06/21  Shingles Vaccination- received first dose on 02/06/21 Regular eye checkups- says he sees his eye doctor every 6 months due to glaucoma and his next appointment is in November  Advanced Directives- states he does not have and does not want information at this time           Hyperlipidemia:  (Status: Condition stable; Not addressed at this  visit will have lipid panel rechecked in Dec Lab Results  Component Value Date   CHOL 174 01/02/2021   HDL 37 (L) 01/02/2021   LDLCALC 91 01/02/2021   TRIG 276 (H) 01/02/2021   CHOLHDL 6.1 (H) 12/20/2019     Medication review performed; medication list updated in electronic medical record.    Hypertension: (Status: Goal on track: NO.)-majority of provider office readings are not meeting treatment targets; patient attributes this to pain, reports his monthly self monitored BP readings are <140/<90 Last practice recorded BP readings:  BP Readings from Last 3 Encounters:  02/26/21 (!) 155/82  02/06/21 (!) 158/84  01/01/21 (!) 156/91     Most recent eGFR/CrCl:  Lab Results  Component Value Date   EGFR 55 (L) 01/02/2021  No components found for: CRCL  Evaluation of current treatment plan related to hypertension self management and patient's adherence to plan as established by provider;   Reviewed BP medications and assessed medication taking behavior ensuring he is taking Losartan that was recently added to his regimen Assessed frequency of self monitored blood press and readings, reviewed treatment targets Discussed plans with patient for ongoing care management follow up and provided patient with direct contact information for care management team; Reviewed scheduled/upcoming provider appointments including: colonoscopy appointment on 02/26/21, orthopedic appointment on 03/19/21 for left hip injection, primary care follow up on 04/09/21      Pain:  (Status: Goal on track: NO.)patient states bilateral hip pain continues and requires regular injections and oxycodone to control Medications reviewed Reviewed provider established plan for pain management; Counseled on the importance of reporting any/all new or changed pain symptoms or management strategies to pain management provider; Positive reinforcement given to patient regarding ongoing weight loss to meet goal to qualify for  bilateral hip surgery- advised patient this RNCM will message priamry care provider about increasing semaglutide to 2 mg weekly to assist with weight loss and decrease sweet cravings   Patient Goals/Self-Care Activities: Patient will self administer medications as prescribed Patient will attend all scheduled provider appointments Patient will call pharmacy for medication refills Patient will continue to perform ADL's independently Patient will continue to perform IADL's independently Patient will call provider office for new concerns or questions Patient will work with pharmacist  to discuss any medication concerns or questions Patient will review Healthy Meal Planning brochure mailed to him on 04/14/21

## 2021-04-23 DIAGNOSIS — M1611 Unilateral primary osteoarthritis, right hip: Secondary | ICD-10-CM | POA: Diagnosis not present

## 2021-04-30 DIAGNOSIS — H5213 Myopia, bilateral: Secondary | ICD-10-CM | POA: Diagnosis not present

## 2021-05-06 ENCOUNTER — Ambulatory Visit: Payer: Medicaid Other | Admitting: Nurse Practitioner

## 2021-05-12 ENCOUNTER — Other Ambulatory Visit: Payer: Self-pay | Admitting: Internal Medicine

## 2021-05-12 DIAGNOSIS — M62838 Other muscle spasm: Secondary | ICD-10-CM

## 2021-05-13 ENCOUNTER — Other Ambulatory Visit: Payer: Self-pay | Admitting: Nurse Practitioner

## 2021-05-13 ENCOUNTER — Other Ambulatory Visit: Payer: Self-pay | Admitting: *Deleted

## 2021-05-13 DIAGNOSIS — E1121 Type 2 diabetes mellitus with diabetic nephropathy: Secondary | ICD-10-CM

## 2021-05-13 MED ORDER — SEMAGLUTIDE (2 MG/DOSE) 8 MG/3ML ~~LOC~~ SOPN: 2.0000 mg | PEN_INJECTOR | SUBCUTANEOUS | 11 refills | Status: DC

## 2021-05-13 NOTE — Patient Outreach (Signed)
Medicaid Managed Care   Nurse Care Manager Note  05/13/2021 Name:  [name] MRN:  [id number] DOB:  08-Nov-1970  [name] is an 50 y.o. year old male who is a primary patient of [name] Larsson, NP.  [facility] Managed Care Coordination team was consulted for assistance with:    HTN HLD DMI Obesity Chronic hip pain  [name] was given information about Medicaid Managed Care Coordination team services today. [name] Patient agreed to services and verbal consent obtained.  Engaged with patient by telephone for follow up visit in response to provider referral for case management and/or care coordination services.   Assessments/Interventions:  Review of past medical history, allergies, medications, health status, including review of consultants reports, laboratory and other test data, was performed as part of comprehensive evaluation and provision of chronic care management services.  SDOH (Social Determinants of Health) assessments and interventions performed:   Care Plan  No Known Allergies  Medications Reviewed Today     Reviewed by [name] Ellison, RN (Registered Nurse) on 05/13/21 at [place] List Status: <None>   Medication Order Taking? Sig Documenting Provider Last Dose Status Informant  acetaminophen (TYLENOL) 500 MG tablet [id number] No Take 1,000 mg by mouth every 6 (six) hours as needed for moderate pain. [provider] 02/26/2021 0300 Active Self  atorvastatin (LIPITOR) 40 MG tablet [id number] No Take 1 tablet (40 mg total) by mouth daily. [name] Larsson, NP 02/26/2021 0500 Active            Med Note [name], [name]   Tue Feb 11, 2021  1:59 PM) Says he is still taking the 10 mg tablet; instructed him to increase to 40 mg daily per MD order of 01/14/21  blood glucose meter kit and supplies KIT [id number] No Test daily Dx  r73.03 [name] Deutscher, DO Taking Active Self  Blood Pressure Monitor KIT [id number] No 1 each by Does not apply route daily.  [name] Larsson, NP Taking Active   cyclobenzaprine (FLEXERIL) 10 MG tablet [id number]  TAKE 1 TABLET BY MOUTH 2 TIMES A DAY AS NEEDED FOR MUSCLE SPASMS. [name] Larsson, NP  Active   ferrous sulfate 324 MG TBEC [id number] No Take 324 mg by mouth every other day. [provider] Past Week Active   glucose blood test strip [id number] No Use as instructed. Can check up to 4 times daily. [name] Larsson, NP Taking Active   Iron Combinations (IRON COMPLEX PO) [id number] No Take 65 mg by mouth daily. [provider] Past Week Active Self  latanoprost (XALATAN) 0.005 % ophthalmic solution [id number] No Place 1 drop into both eyes at bedtime. [provider] 02/25/2021 Active Self  losartan (COZAAR) 25 MG tablet [id number]  Take 1 tablet (25 mg total) by mouth daily. [name] Spar, MD  Active   methylcellulose oral powder [id number] No Take 1 packet by mouth daily.  Patient not taking: No sig reported   [provider] Not Taking Active   Multiple Vitamin (MULTIVITAMIN WITH MINERALS) TABS tablet [id number] No Take 1 tablet by mouth daily. [provider] 02/26/2021 500 Active Self  NARCAN 4 MG/0.1ML LIQD nasal spray kit [id number] No Place 0.4 mg into the nose once. [provider] Taking Active Self  Oxycodone HCl 20 MG TABS [id number] No Take 20 mg by mouth every 6 (six) hours as needed (pain). [provider] 02/26/2021 0500 Active Self  Med Note [name]   Tue Jan 07, 2021 10:36 AM) [name] prn  Semaglutide, 1 MG/DOSE, 4 MG/3ML SOPN [id number]  Inject 1 mg as directed once a week. [name] Spar, MD  Active   sildenafil (VIAGRA) 50 MG tablet [id number]  Take 1 tablet (50 mg total) by mouth daily as needed for erectile dysfunction. [name] Spar, MD  Active   timolol (BETIMOL) 0.5 % ophthalmic solution [id number] No Place 1 drop into both eyes in the morning. [provider] 02/26/2021 0500 Active Self            Patient  Active Problem List   Diagnosis Date Noted   Immunization due 02/06/2021   Iron deficiency anemia 12/30/2020   History of colonic polyps 12/30/2020   Unilateral primary osteoarthritis, right hip 08/13/2020   Unilateral primary osteoarthritis, left hip 08/13/2020   Muscle spasms of both lower extremities 05/23/2020   Need for immunization against influenza 03/06/2020   Primary osteoarthritis of both hips 07/10/2019   Bilateral primary osteoarthritis of hip 07/10/2019   Vitamin D deficiency 04/13/2019   Morbid obesity with body mass index (BMI) of 40.0 to 44.9 in adult ([place]) 04/13/2019   Depressive disorder 04/13/2019   Body mass index (BMI) 45.0-49.9, adult ([place]) 04/13/2019   Kidney disease, chronic, stage III (GFR 30-59 ml/min) ([place]) 07/20/2017   History of cerebrovascular accident 07/20/2017   Gastroesophageal reflux disease 07/20/2017   Chronic kidney disease, stage 3 unspecified ([place]) 07/20/2017   Gastric bypass status for obesity 02/11/2017   Benzodiazepine dependence ([place]) 02/11/2017   Type 2 diabetes mellitus with diabetic nephropathy ([place]) 08/03/2008   Hyperlipidemia 08/03/2008   SLEEP APNEA 08/03/2008   Sleep apnea 08/03/2008     Care Plan : RN Care Manager Plan Of Care  Updates made by [name] Ellison, RN since 05/13/2021 12:00 AM     Problem: Chronic Disease Management and Care Coordination Needs for DM, HTN, HLD, CKD, chronic hip pain      Long-Range Goal: Development of Plan Of Care For Chronic Disease Management and Care Coordination Needs to Assist With Meeting Treatment Goals for DM, HTN, HLD, CKD, chronic hip pain   Start Date: 01/07/2021  Expected End Date: 09/15/2021  Priority: High  Note:   Current Barriers:  Knowledge Deficits related to plan of care for management of HTN, HLD, and DMII, chronic pain- patient states he continues to receive steroid shots in hips alternating  each month and requires oxycodone to manage pain, says he missed his appointment with  his primary care provider on 05/06/21 because his truck broke down, he rescheduled the appointment for 05/15/21 Chronic Disease Management support and education needs related to HTN, HLD, and DMII, chronic pain  RNCM Clinical Goal([name]):  Patient will verbalize understanding of plan for management of HTN, HLD, and DMII take all medications exactly as prescribed and will call provider for medication related questions attend all scheduled medical appointments: primary care follow up on 05/15/21 demonstrate ongoing adherence to prescribed treatment plan for HTN, HLD, and DMII as evidenced by daily monitoring and recording of fasting CBG,  adherence to ADA/ carb modified, fat modified, no added salt diet, continue 30-40 minutes of exercise on your NuStep 5-7 days/week, adherence to prescribed medication regimen including new diabetes medication,  contacting provider for new or worsened symptoms or questions related to HTN, DM or HLD, CKD continue to work with RN Care Manager to address care management and care coordination needs related to HTN, HLD, and  DMII , CKD  Interventions: Inter-disciplinary care team collaboration (see longitudinal plan of care) Evaluation of current treatment plan related to  self management and patient'[name] adherence to plan as established by provider Ensured patient received  "Planning Healthy Meals" brochure mailed to him last month and provided opportunity to ask questions   Chronic Kidney Disease (Status: Goal on track: NO.) - patient reports he is monitoring his BP at home about once monthly and that readings are usually <140/<90, he says provider office readings are higher because he is in pain from the walk to the offices. 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 Last practice recorded BP readings:  BP Readings from Last 3 Encounters:  02/26/21 (!) 155/82  02/06/21 (!) 158/84  01/01/21 (!) 156/91       Most recent  eGFR/CrCl:  Lab Results  Component Value Date   EGFR 55 (L) 01/02/2021    No components found for: CRCL  Renal Function Panel Specimen:  Blood - Blood, Venous  Ref Range & Units 2 wk ago Comments  Glucose 65 - 99 mg/dL 102 High               Fasting reference interval   For someone without known diabetes, a glucose value  between 100 and 125 mg/dL is consistent with  prediabetes and should be confirmed with a  follow-up test.  BUN 7 - 25 mg/dL 15    Creatinine 0.70 - 1.30 mg/dL 1.56 High     eGFR CKD-EPI CR 2021 > OR = 60 mL/min/1.20m 53 Low   The eGFR is based on the CKD-EPI 2021 equation. To calculate  the new eGFR from a previous Creatinine or Cystatin C  result, go to https://www.kidney.org/professionals/  kdoqi/[email or web address]  BUN/Creatinine Ratio 6 - 22 (calc) 10    Sodium 135 - 146 mmol/L 139    Potassium 3.5 - 5.3 mmol/L 3.7    Chloride 98 - 110 mmol/L 103    Bicarbonate (CO2) 20 - 32 mmol/L 24    Calcium 8.6 - 10.3 mg/dL 9.2    Phosphorus 2.5 - 4.5 mg/dL 3.1    Albumin 3.6 - 5.1 g/dL 3.6    Resulting Agency  [facility]   Narrative Performed by [facility] FASTING:YES   FASTING: YES Resulting Agency Comment  Performing Organization Information:      Site ID: [id number]      Name: [facility]      Address: [address]  [address]     Director: JCathleen FearsHessling Specimen Collected: 03/26/21 10:46 Last Resulted: 03/27/21 01:53  [facility]  Result Received: 04/03/21 08:13    Reviewed medications with patient and discussed importance of compliance   Assessed frequency of self monitored BP, reviewed home and provider office readings, reviewed treatment targets and reviewed lifestyle strategies      Reviewed scheduled/upcoming provider appointments including:  primary care follow up on 05/15/21  Discussed plans with patient for ongoing care management follow up and provided patient with direct  contact information for care management team      Diabetes:  (Status: Goal on track: YES.)- patient reports self monitored fasting blood sugars 100-140; 150'[name] if he eats sweets Lab Results  Component Value Date   HGBA1C 7.9 (H) 01/02/2021  Assessed frequency of CBG testing and reviewed fasting target Reviewed medications with patient and discussed importance of medication adherence Discussed plans with patient for ongoing care management follow  up and provided patient with direct contact information for care management team;      Reviewed scheduled/upcoming provider appointments including:  primary care follow up on 05/15/21            Health Maintenance (Status: Condition stable. Not addressed this visit.) - patient completed colonoscopy and endoscopy on 02/26/21 and was aware of pathology report- non cancerous Patient interviewed about adult health maintenance status   Pneumonia Vaccine- states he received PNA vaccine 4 years ago Influenza Vaccine- states he received flu shot on 02/06/21 COVID vaccination   - pt states he will receive at next primary care appointment on 05/05/21  Shingles Vaccination- received first dose on 02/06/21 Regular eye checkups- says he sees his eye doctor every 6 months due to glaucoma and his next appointment is in November  Advanced Directives- states he does not have and does not want information at this time           Hyperlipidemia:  (Status: Condition stable; Not addressed at this visit will have lipid panel rechecked in Dec Lab Results  Component Value Date   CHOL 174 01/02/2021   HDL 37 (L) 01/02/2021   LDLCALC 91 01/02/2021   TRIG 276 (H) 01/02/2021   CHOLHDL 6.1 (H) 12/20/2019     Medication review performed; medication list updated in electronic medical record.    Hypertension: (Status: Goal on track: NO.)-majority of provider office readings are not meeting treatment targets; patient attributes this to pain, reports his monthly self  monitored BP readings are <140/<90 Last practice recorded BP readings:  BP Readings from Last 3 Encounters:  02/26/21 (!) 155/82  02/06/21 (!) 158/84  01/01/21 (!) 156/91     Most recent eGFR/CrCl:  Lab Results  Component Value Date   EGFR 55 (L) 01/02/2021    No components found for: CRCL  Evaluation of current treatment plan related to hypertension self management and patient'[name] adherence to plan as established by provider;   Reviewed BP medications and assessed medication taking behavior ensuring he is taking Losartan that was recently added to his regimen Assessed frequency of self monitored blood press and readings, reviewed treatment targets Discussed plans with patient for ongoing care management follow up and provided patient with direct contact information for care management team; Reviewed scheduled/upcoming provider appointments including: colonoscopy appointment on 02/26/21, orthopedic appointment on 03/19/21 for left hip injection, primary care follow up on 04/09/21      Pain:  (Status: Goal on track: NO.)patient states bilateral hip pain continues and requires regular injections and oxycodone to control Medications reviewed Reviewed provider established plan for pain management; Counseled on the importance of reporting any/all new or changed pain symptoms or management strategies to pain management provider; Positive reinforcement given to patient regarding ongoing weight loss to meet goal to qualify for bilateral hip surgery- advised patient this RNCM will message primary care provider again about increasing semaglutide to 2 mg weekly to assist with weight loss and decrease sweet cravings and suggest referral to dietician or weight loss clinic   Patient Goals/Self-Care Activities: Patient will self administer medications as prescribed Patient will attend all scheduled provider appointments Patient will call pharmacy for medication refills Patient will continue to perform  ADL'[name] independently Patient will continue to perform IADL'[name] independently Patient will call provider office for new concerns or questions Patient will work with pharmacist  to discuss any medication concerns or questions       Follow Up:  Patient agrees to Care Plan and Follow-up.  Plan: The Managed Medicaid care management team will reach out to the patient again over the next 30 days.  Date/time of next scheduled RN care management/care coordination outreach:  06/10/21 at 3:30 pm  Kelli Churn RN, CCM, [place] Management Coordinator - Managed [place] High Risk [phone]

## 2021-05-13 NOTE — Patient Instructions (Signed)
Visit Information  [name] was given information about Medicaid Managed Care team care coordination services as a part of their Healthy [facility] Medicaid benefit. [name] verbally consented to engagement with the [facility] Managed Care team.   If you are experiencing a medical emergency, please call 911 or report to your local emergency department or urgent care.   If you have a non-emergency medical problem during routine business hours, please contact your provider's office and ask to speak with a nurse.   For questions related to your Healthy [facility] health plan, please call: [phone] or visit the homepage here: [email or web address]  If you would like to schedule transportation through your Healthy [facility] plan, please call the following number at least 2 days in advance of your appointment: [phone]  Call the [facility] at [phone], at any time, 24 hours a day, 7 days a week. If you are in danger or need immediate medical attention call 911.  If you would like help to quit smoking, call 1-800-QUIT-NOW (605)872-0118) OR Espaol: 1-855-Djelo-Ya (3-903-009-2330) o para ms informacin haga clic aqu or Text READY to 200-400 to register via text  [name] - following are the goals we discussed in your visit today:   Goals Addressed   Patient will self administer medications as prescribed Patient will attend all scheduled provider appointments Patient will call pharmacy for medication refills Patient will continue to perform ADL's independently Patient will continue to perform IADL's independently Patient will call provider office for new concerns or questions Patient will work with pharmacist  to discuss any medication concerns or questions    The patient verbalized understanding of instructions provided today and declined a print copy of patient instruction materials.   The Managed Medicaid care  management team will reach out to the patient again over the next 30 days.   Kelli Churn RN, CCM, [place] Management Coordinator - Managed [place] High Risk [phone]   Following is a copy of your plan of care:  Care Plan : RN Care Manager Plan Of Care  Updates made by [name], RN since 05/13/2021 12:00 AM     Problem: Chronic Disease Management and Care Coordination Needs for DM, HTN, HLD, CKD      Long-Range Goal: Development of Plan Of Care For Chronic Disease Management and Care Coordination Needs to Assist With Meeting Treatment Goals for DM, HTN, obesity, chronic hip pain   Start Date: 01/07/2021  Expected End Date: 09/15/2021  Priority: High  Note:   Current Barriers:  Knowledge Deficits related to plan of care for management of HTN, HLD, and DMII, chronic pain- patient states he continues to receive steroid shots in hips alternating  each month and requires oxycodone to manage pain, says he missed his appointment with his primary care provider on 05/06/21 because his truck broke down, he rescheduled the appointment for 05/15/21 Chronic Disease Management support and education needs related to HTN, HLD, and DMII, chronic pain  RNCM Clinical Goal(s):  Patient will verbalize understanding of plan for management of HTN, HLD, and DMII take all medications exactly as prescribed and will call provider for medication related questions attend all scheduled medical appointments: primary care follow up on 05/15/21 demonstrate ongoing adherence to prescribed treatment plan for HTN, HLD, and DMII as evidenced by daily monitoring and recording of fasting CBG,  adherence to ADA/ carb modified, fat modified, no added salt diet, continue 30-40 minutes of exercise  on your NuStep 5-7 days/week, adherence to prescribed medication regimen including new diabetes medication,  contacting provider for new or worsened symptoms or questions related to  HTN, DM or HLD, CKD continue to work with RN Care Manager to address care management and care coordination needs related to HTN, HLD, and DMII , CKD  Interventions: Inter-disciplinary care team collaboration (see longitudinal plan of care) Evaluation of current treatment plan related to  self management and patient's adherence to plan as established by provider Ensured patient received  "Planning Healthy Meals" brochure mailed to him last month and provided opportunity to ask questions   Chronic Kidney Disease (Status: Goal on track: NO.) - patient reports he is monitoring his BP at home about once monthly and that readings are usually <140/<90, he says provider office readings are higher because he is in pain from the walk to the offices. 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  BUN 7 - 25 mg/dL 15    Creatinine 0.70 - 1.30 mg/dL 1.56 High     eGFR CKD-EPI CR 2021 > OR = 60 mL/min/1.11m 53 Low   The eGFR is based on the CKD-EPI 2021 equation. To calculate  the new eGFR from a previous Creatinine or Cystatin C  result, go to https://www.kidney.org/professionals/  kdoqi/gfr%[email or web address]  BUN/Creatinine Ratio 6 - 22 (calc) 10    Sodium 135 - 146 mmol/L 139    Potassium 3.5 - 5.3 mmol/L 3.7    Chloride 98 - 110 mmol/L 103    Bicarbonate (CO2) 20 -  32 mmol/L 24    Calcium 8.6 - 10.3 mg/dL 9.2    Phosphorus 2.5 - 4.5 mg/dL 3.1    Albumin 3.6 - 5.1 g/dL 3.6    Resulting Agency  [facility]   Narrative Performed by [facility] FASTING:YES   FASTING: YES Resulting Agency Comment  Performing Organization Information:      Site ID: [id number]      Name: [facility]      Address: [address] [address]     Director: JCathleen FearsHessling Specimen Collected: 03/26/21 10:46 Last Resulted: 03/27/21 01:53  Received From: [facility]  Result Received: 04/03/21 08:13    Reviewed medications with patient and discussed importance of compliance   Assessed frequency of self monitored BP, reviewed home and provider office readings, reviewed treatment targets and reviewed lifestyle strategies      Reviewed scheduled/upcoming provider appointments including:  primary care follow up on 05/15/21  Discussed plans with patient for ongoing care management follow up and provided patient with direct contact information for care management team      Diabetes:  (Status: Goal on track: YES.)- patient reports self  monitored fasting blood sugars 100-140; 150's if he eats sweets Lab Results  Component Value Date   HGBA1C 7.9 (H) 01/02/2021  Assessed frequency of CBG testing and reviewed fasting target Reviewed medications with patient and discussed importance of medication adherence Discussed plans with patient for ongoing care management follow up and provided patient with direct contact information for care management team;      Reviewed scheduled/upcoming provider appointments including:  primary care follow up on 05/15/21            Health Maintenance (Status: Condition stable. Not addressed this visit.) - patient completed colonoscopy and endoscopy on 02/26/21 and was aware of pathology report- non cancerous Patient interviewed about adult health maintenance status   Pneumonia Vaccine- states he received PNA  vaccine 4 years ago Influenza Vaccine- states he received flu shot on 02/06/21 COVID vaccination   - pt states he will receive at next primary care appointment on 05/05/21  Shingles Vaccination- received first dose on 02/06/21 Regular eye checkups- says he sees his eye doctor every 6 months due to glaucoma and his next appointment is in November  Advanced Directives- states he does not have and does not want information at this time           Hyperlipidemia:  (Status: Condition stable; Not addressed at this visit will have lipid panel rechecked in Dec Lab Results  Component Value Date   CHOL 174 01/02/2021   HDL 37 (L) 01/02/2021   LDLCALC 91 01/02/2021   TRIG 276 (H) 01/02/2021   CHOLHDL 6.1 (H) 12/20/2019     Medication review performed; medication list updated in electronic medical record.    Hypertension: (Status: Goal on track: NO.)-majority of provider office readings are not meeting treatment targets; patient attributes this to pain, reports his monthly self monitored BP readings are <140/<90 Last practice recorded BP readings:  BP Readings from Last 3 Encounters:  02/26/21 (!) 155/82  02/06/21 (!) 158/84  01/01/21 (!) 156/91     Most recent eGFR/CrCl:  Lab Results  Component Value Date   EGFR 55 (L) 01/02/2021    No components found for: CRCL  Evaluation of current treatment plan related to hypertension self management and patient's adherence to plan as established by provider;   Reviewed BP medications and assessed medication taking behavior ensuring he is taking Losartan that was recently added to his regimen Assessed frequency of self monitored blood press and readings, reviewed treatment targets Discussed plans with patient for ongoing care management follow up and provided patient with direct contact information for care management team; Reviewed scheduled/upcoming provider appointments including: colonoscopy appointment on 02/26/21, orthopedic appointment on  03/19/21 for left hip injection, primary care follow up on 04/09/21      Pain:  (Status: Goal on track: NO.)patient states bilateral hip pain continues and requires regular injections and oxycodone to control Medications reviewed Reviewed provider established plan for pain management; Counseled on the importance of reporting any/all new or changed pain symptoms or management strategies to pain management provider; Positive reinforcement given to patient regarding ongoing weight loss to meet goal to qualify for bilateral hip surgery- advised patient this RNCM will message primary care provider again about increasing semaglutide to 2 mg weekly to assist with weight loss and decrease sweet cravings and suggest referral to dietician or weight loss clinic   Patient Goals/Self-Care Activities: Patient will self administer medications as prescribed Patient will attend all scheduled provider appointments Patient will call pharmacy for medication refills Patient will  continue to perform ADL's independently Patient will continue to perform IADL's independently Patient will call provider office for new concerns or questions Patient will work with pharmacist  to discuss any medication concerns or questions

## 2021-05-15 ENCOUNTER — Encounter: Payer: Self-pay | Admitting: Nurse Practitioner

## 2021-05-15 ENCOUNTER — Other Ambulatory Visit: Payer: Self-pay

## 2021-05-15 ENCOUNTER — Ambulatory Visit: Payer: Medicaid Other | Admitting: Nurse Practitioner

## 2021-05-15 VITALS — BP 151/82 | HR 98 | Ht 68.0 in | Wt 277.0 lb

## 2021-05-15 DIAGNOSIS — N1832 Chronic kidney disease, stage 3b: Secondary | ICD-10-CM

## 2021-05-15 DIAGNOSIS — E785 Hyperlipidemia, unspecified: Secondary | ICD-10-CM

## 2021-05-15 DIAGNOSIS — M160 Bilateral primary osteoarthritis of hip: Secondary | ICD-10-CM

## 2021-05-15 DIAGNOSIS — E1121 Type 2 diabetes mellitus with diabetic nephropathy: Secondary | ICD-10-CM | POA: Diagnosis not present

## 2021-05-15 DIAGNOSIS — Z23 Encounter for immunization: Secondary | ICD-10-CM | POA: Diagnosis not present

## 2021-05-15 DIAGNOSIS — D509 Iron deficiency anemia, unspecified: Secondary | ICD-10-CM | POA: Diagnosis not present

## 2021-05-15 NOTE — Assessment & Plan Note (Signed)
check lipids.   

## 2021-05-15 NOTE — Progress Notes (Addendum)
Acute Office Visit  Subjective:    Patient ID: [name], male    DOB: April 19, 1971, 50 y.o.   MRN: [id number]  Chief Complaint  Patient presents with   Follow-up    HPI Patient is in today for lab follow-up for T2DM, HTN, and HLD.  He is followed by [facility] for pain management.   Past Medical History:  Diagnosis Date   ALLERGY 08/03/2008   Qualifier: Diagnosis of  By: Christ Kick     Anemia    Arthritis    "hips" (01/15/2015)   Blood transfusion without reported diagnosis    Cellulitis 07/25/2018   Chronic back pain greater than 3 months duration 02/11/2017   Chronic lower back pain    Colon polyps    COLONIC POLYPS, ADENOMATOUS 08/03/2008   Qualifier: Diagnosis of  By: Christ Kick     CVA 08/03/2008   Qualifier: History of  By: Christ Kick     Depression    denies   Diabetes mellitus    diet controlled- no meds since wt loss   Diabetes mellitus ([place]) 07/10/2019   Dysrhythmia    s/p surgery bariatric- cardioversion   Erectile dysfunction 01/26/2018   GERD (gastroesophageal reflux disease)    HIATAL HERNIA 08/03/2008   Qualifier: Diagnosis of  By: Christ Kick     Hip pain    History of gout    Hyperlipidemia    Hypertension    Joint pain    Narcotic dependence ([place]) 02/11/2017   Neuromuscular disorder ([place])    PONV (postoperative nausea and vomiting)    Pre-diabetes    Prediabetes 03/29/2017   Sleep apnea    no longer have to use cpap since wt loss   Stage 3 chronic kidney disease ([place]) 01/26/2018   Stroke ([place]) 2006   denies residual on 01/15/2015   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 ([place]) 08/01/2018    Past Surgical History:  Procedure Laterality Date   BARIATRIC SURGERY  2010   in [place]  02/26/2021   Procedure: BIOPSY;  Surgeon: Harvel Quale, MD;  [facility];  Service: Gastroenterology;;   CARDIOVERSION     COLONOSCOPY N/A 08/02/2014   Procedure: COLONOSCOPY;  Surgeon: Rogene Houston,  MD;  [facility];  Service: Endoscopy;  Laterality: N/A;  210 - moved to 3/10 @ 11:55 - Ann notified pt   COLONOSCOPY WITH PROPOFOL N/A 02/26/2021   Procedure: COLONOSCOPY WITH PROPOFOL;  Surgeon: Harvel Quale, MD;  [facility];  Service: Gastroenterology;  Laterality: N/A;  10:00   ESOPHAGOGASTRODUODENOSCOPY     ESOPHAGOGASTRODUODENOSCOPY (EGD) WITH PROPOFOL N/A 02/26/2021   Procedure: ESOPHAGOGASTRODUODENOSCOPY (EGD) WITH PROPOFOL;  Surgeon: Harvel Quale, MD;  [facility];  Service: Gastroenterology;  Laterality: N/A;   HERNIA REPAIR     LIPOSUCTION     PANNICULECTOMY  01/14/2015   PANNICULECTOMY N/A 01/14/2015   Procedure:  TOTAL PANNICULECTOMY WTH LIPOSUCTION OF SIDES;  Surgeon: Cristine Polio, MD;  Location: [place];  Service: Plastics;  Laterality: N/A;   POLYPECTOMY  02/26/2021   Procedure: POLYPECTOMY INTESTINAL;  Surgeon: Harvel Quale, MD;  [facility];  Service: Gastroenterology;;   TONSILLECTOMY     VENTRAL HERNIA REPAIR  01/14/2015   VENTRAL HERNIA REPAIR N/A 01/14/2015   Procedure: HERNIA REPAIR VENTRAL ADULT AND MUSCLE REPAIR;  Surgeon: Cristine Polio, MD;  Location: [facility];  Service: Plastics;  Laterality: N/A;  Family History  Problem Relation Age of Onset   COPD Mother    Depression Mother    Hyperlipidemia Mother    Hypertension Mother    Heart disease Mother    Thyroid disease Mother    Anxiety disorder Mother    Diabetes Father    Pulmonary embolism Father 45       blood clot   Hyperlipidemia Father    Sudden death Father    Obesity Father    Eating disorder Father    Hypertension Brother     Social History   Socioeconomic History   Marital status: Single    Spouse name: Not on file   Number of children: 0   Years of education: 12   Highest education level: Not on file  Occupational History   Not on file  Tobacco Use   Smoking status: Never   Smokeless tobacco:  Never  Vaping Use   Vaping Use: Never used  Substance and Sexual Activity   Alcohol use: No   Drug use: No   Sexual activity: Yes    Birth control/protection: Condom  Other Topics Concern   Not on file  Social History Narrative   Lives alone   Cat[employer] [place]      Enjoy races, eat out, watch movies, listen music, reads some      Diet: Snacks a lot, tried to avoid red meat, does eat chicken and [place], eats a lot of carbs and breads (chips), and sweets   Caffeine: drinks a can monster daily   Water: 3-4 bottles 16.9 oz      Wear seat belt   Wear sunscreen   Smoke and carbon monoxide detectors    Reports not using phone when driving   Social Determinants of Health   Financial Resource Strain: Low Risk    Difficulty of Paying Living Expenses: Not hard at all  Food Insecurity: No Food Insecurity   Worried About Charity fundraiser in the Last Year: Never true   Comfort in the Last Year: Never true  Transportation Needs: No Transportation Needs   Lack of Transportation (Medical): No   Lack of Transportation (Non-Medical): No  Physical Activity: Sufficiently Active   Days of Exercise per Week: 7 days   Minutes of Exercise per Session: 40 min  Stress: No Stress Concern Present   Feeling of Stress : Only a little  Social Connections: Unknown   Frequency of Communication with Friends and Family: Not on file   Frequency of Social Gatherings with Friends and Family: Not on file   Attends Religious Services: Never   Marine scientist or Organizations: No   Attends Archivist Meetings: Never   Marital Status: Not on file  Intimate Partner Violence: Not on file    Outpatient Medications Prior to Visit  Medication Sig Dispense Refill   atorvastatin (LIPITOR) 40 MG tablet Take 1 tablet (40 mg total) by mouth daily. 90 tablet 3   blood glucose meter kit and supplies KIT Test daily Dx  r73.03 1 each 0   Blood Pressure Monitor KIT 1 each by Does not apply route  daily. 1 kit 0   cyclobenzaprine (FLEXERIL) 10 MG tablet TAKE 1 TABLET BY MOUTH 2 TIMES A DAY AS NEEDED FOR MUSCLE SPASMS. 60 tablet 0   glucose blood test strip Use as instructed. Can check up to 4 times daily. 100 each 3   latanoprost (XALATAN) 0.005 % ophthalmic solution Place 1  drop into both eyes at bedtime.     losartan (COZAAR) 25 MG tablet Take 1 tablet (25 mg total) by mouth daily. 90 tablet 0   NARCAN 4 MG/0.1ML LIQD nasal spray kit Place 0.4 mg into the nose once.     Oxycodone HCl 20 MG TABS Take 20 mg by mouth every 6 (six) hours as needed (pain).     Semaglutide, 2 MG/DOSE, 8 MG/3ML SOPN Inject 2 mg as directed once a week. 3 mL 11   sildenafil (VIAGRA) 50 MG tablet Take 1 tablet (50 mg total) by mouth daily as needed for erectile dysfunction. 20 tablet 0   timolol (BETIMOL) 0.5 % ophthalmic solution Place 1 drop into both eyes in the morning.     No facility-administered medications prior to visit.    No Known Allergies  Review of Systems  Constitutional: Negative.   Respiratory: Negative.    Cardiovascular: Negative.   Musculoskeletal:  Positive for arthralgias.       Bilateral hip pain; chronic      Objective:    Physical Exam Constitutional:      Appearance: Normal appearance. He is obese.  Cardiovascular:     Rate and Rhythm: Normal rate and regular rhythm.     Pulses: Normal pulses.     Heart sounds: Normal heart sounds.  Musculoskeletal:     Comments: Uses walker for ambulation  Neurological:     Mental Status: He is alert.    BP (!) 151/82    Pulse 98    Ht '5\' 8"'  (1.727 m)    Wt 277 lb (125.6 kg)    SpO2 96%    BMI 42.12 kg/m  Wt Readings from Last 3 Encounters:  05/15/21 277 lb (125.6 kg)  02/26/21 279 lb 0.7 oz (126.6 kg)  02/06/21 279 lb 0.6 oz (126.6 kg)    Health Maintenance Due  Topic Date Due   Pneumococcal Vaccine 71-31 Years old (1 - PCV) 10/13/1975    There are no preventive care reminders to display for this patient.   Lab  Results  Component Value Date   TSH 1.81 04/13/2019   Lab Results  Component Value Date   WBC 9.4 01/02/2021   HGB 15.9 01/02/2021   HCT 47.7 01/02/2021   MCV 92 01/02/2021   PLT 207 01/02/2021   Lab Results  Component Value Date   NA 135 01/02/2021   K 4.6 01/02/2021   CO2 24 01/02/2021   GLUCOSE 165 (H) 01/02/2021   BUN 18 01/02/2021   CREATININE 1.52 (H) 01/02/2021   BILITOT 1.1 01/02/2021   ALKPHOS 104 01/02/2021   AST 23 01/02/2021   ALT 18 01/02/2021   PROT 6.9 01/02/2021   ALBUMIN 4.2 01/02/2021   CALCIUM 10.0 01/02/2021   ANIONGAP 10 07/25/2018   EGFR 55 (L) 01/02/2021   Lab Results  Component Value Date   CHOL 174 01/02/2021   Lab Results  Component Value Date   HDL 37 (L) 01/02/2021   Lab Results  Component Value Date   LDLCALC 91 01/02/2021   Lab Results  Component Value Date   TRIG 276 (H) 01/02/2021   Lab Results  Component Value Date   CHOLHDL 6.1 (H) 12/20/2019   Lab Results  Component Value Date   HGBA1C 7.9 (H) 01/02/2021       Assessment & Plan:   Problem List Items Addressed This Visit       Endocrine   Type 2 diabetes mellitus with diabetic nephropathy (  [place])    -check A1c with labs -on ARB and statin -increased ozempic to 2 mg yesterday        Musculoskeletal and Integument   Primary osteoarthritis of both hips    -goal weight 265 for replacements -increased Ozempic to help with weight loss so he can be a surgical candidate        Genitourinary   Kidney disease, chronic, stage III (GFR 30-59 ml/min) (HCC)    -check renal function -maintain BP and A1c control to prevent further decline        Other   Hyperlipidemia    -check lipids        No orders of the defined types were placed in this encounter.    [name], NP

## 2021-05-15 NOTE — Patient Instructions (Signed)
Please have fasting labs drawn today. Labs should already be in the computer.  I will be moving to [place] located at [address], [place], [place] [address] effective May 25, 2021. If you would like to establish care with [facility] please call [phone].

## 2021-05-15 NOTE — Assessment & Plan Note (Signed)
-  check renal function -maintain BP and A1c control to prevent further decline

## 2021-05-15 NOTE — Addendum Note (Signed)
Addended by: Quentin Angst on: 05/15/2021 02:29 PM   Modules accepted: Orders

## 2021-05-15 NOTE — Assessment & Plan Note (Signed)
-  goal weight 265 for replacements -increased Ozempic to help with weight loss so he can be a surgical candidate

## 2021-05-15 NOTE — Assessment & Plan Note (Addendum)
-  check A1c with labs -on ARB and statin -increased ozempic to 2 mg yesterday

## 2021-05-16 NOTE — Progress Notes (Signed)
Labs are stable and A1c is at goat at exactly 7.0.

## 2021-05-21 LAB — CBC WITH DIFFERENTIAL/PLATELET
Basophils Absolute: 0.1 10*3/uL (ref 0.0–0.2)
Basos: 1 %
EOS (ABSOLUTE): 0.2 10*3/uL (ref 0.0–0.4)
Eos: 3 %
Hematocrit: 44.5 % (ref 37.5–51.0)
Hemoglobin: 15 g/dL (ref 13.0–17.7)
Immature Grans (Abs): 0 10*3/uL (ref 0.0–0.1)
Immature Granulocytes: 0 %
Lymphocytes Absolute: 2.9 10*3/uL (ref 0.7–3.1)
Lymphs: 36 %
MCH: 31.5 pg (ref 26.6–33.0)
MCHC: 33.7 g/dL (ref 31.5–35.7)
MCV: 94 fL (ref 79–97)
Monocytes Absolute: 0.8 10*3/uL (ref 0.1–0.9)
Monocytes: 10 %
Neutrophils Absolute: 4.1 10*3/uL (ref 1.4–7.0)
Neutrophils: 50 %
Platelets: 215 10*3/uL (ref 150–450)
RBC: 4.76 x10E6/uL (ref 4.14–5.80)
RDW: 12.6 % (ref 11.6–15.4)
WBC: 8.1 10*3/uL (ref 3.4–10.8)

## 2021-05-21 LAB — HEMOGLOBIN A1C
Est. average glucose Bld gHb Est-mCnc: 154 mg/dL
Hgb A1c MFr Bld: 7 % — ABNORMAL HIGH (ref 4.8–5.6)

## 2021-05-21 LAB — CMP14+EGFR
ALT: 27 IU/L (ref 0–44)
AST: 24 IU/L (ref 0–40)
Albumin/Globulin Ratio: 1.7 (ref 1.2–2.2)
Albumin: 4.3 g/dL (ref 3.8–4.9)
Alkaline Phosphatase: 109 IU/L (ref 44–121)
BUN/Creatinine Ratio: 15 (ref 9–20)
BUN: 25 mg/dL — ABNORMAL HIGH (ref 6–24)
Bilirubin Total: 0.7 mg/dL (ref 0.0–1.2)
CO2: 26 mmol/L (ref 20–29)
Calcium: 9.1 mg/dL (ref 8.7–10.2)
Chloride: 97 mmol/L (ref 96–106)
Creatinine, Ser: 1.63 mg/dL — ABNORMAL HIGH (ref 0.76–1.27)
Globulin, Total: 2.6 g/dL (ref 1.5–4.5)
Glucose: 128 mg/dL — ABNORMAL HIGH (ref 70–99)
Potassium: 4.1 mmol/L (ref 3.5–5.2)
Sodium: 137 mmol/L (ref 134–144)
Total Protein: 6.9 g/dL (ref 6.0–8.5)
eGFR: 51 mL/min/{1.73_m2} — ABNORMAL LOW (ref >59)

## 2021-05-21 LAB — LIPID PANEL WITH LDL/HDL RATIO
Cholesterol, Total: 162 mg/dL (ref 100–199)
HDL: 48 mg/dL (ref >39)
LDL Chol Calc (NIH): 88 mg/dL (ref 0–99)
LDL/HDL Ratio: 1.8 ratio (ref 0.0–3.6)
Triglycerides: 147 mg/dL (ref 0–149)
VLDL Cholesterol Cal: 26 mg/dL (ref 5–40)

## 2021-05-21 LAB — IRON,TIBC AND FERRITIN PANEL
Ferritin: 225 ng/mL (ref 30–400)
Iron Saturation: 24 % (ref 15–55)
Iron: 76 ug/dL (ref 38–169)
Total Iron Binding Capacity: 316 ug/dL (ref 250–450)
UIBC: 240 ug/dL (ref 111–343)

## 2021-06-02 DIAGNOSIS — I5032 Chronic diastolic (congestive) heart failure: Secondary | ICD-10-CM | POA: Diagnosis not present

## 2021-06-02 DIAGNOSIS — N189 Chronic kidney disease, unspecified: Secondary | ICD-10-CM | POA: Diagnosis not present

## 2021-06-02 DIAGNOSIS — E1122 Type 2 diabetes mellitus with diabetic chronic kidney disease: Secondary | ICD-10-CM | POA: Diagnosis not present

## 2021-06-02 DIAGNOSIS — E1129 Type 2 diabetes mellitus with other diabetic kidney complication: Secondary | ICD-10-CM | POA: Diagnosis not present

## 2021-06-02 DIAGNOSIS — I129 Hypertensive chronic kidney disease with stage 1 through stage 4 chronic kidney disease, or unspecified chronic kidney disease: Secondary | ICD-10-CM | POA: Diagnosis not present

## 2021-06-02 DIAGNOSIS — R809 Proteinuria, unspecified: Secondary | ICD-10-CM | POA: Diagnosis not present

## 2021-06-05 DIAGNOSIS — E1122 Type 2 diabetes mellitus with diabetic chronic kidney disease: Secondary | ICD-10-CM | POA: Diagnosis not present

## 2021-06-05 DIAGNOSIS — E1129 Type 2 diabetes mellitus with other diabetic kidney complication: Secondary | ICD-10-CM | POA: Diagnosis not present

## 2021-06-05 DIAGNOSIS — R809 Proteinuria, unspecified: Secondary | ICD-10-CM | POA: Diagnosis not present

## 2021-06-05 DIAGNOSIS — N189 Chronic kidney disease, unspecified: Secondary | ICD-10-CM | POA: Diagnosis not present

## 2021-06-05 DIAGNOSIS — I129 Hypertensive chronic kidney disease with stage 1 through stage 4 chronic kidney disease, or unspecified chronic kidney disease: Secondary | ICD-10-CM | POA: Diagnosis not present

## 2021-06-05 DIAGNOSIS — I5032 Chronic diastolic (congestive) heart failure: Secondary | ICD-10-CM | POA: Diagnosis not present

## 2021-06-06 ENCOUNTER — Other Ambulatory Visit: Payer: Self-pay | Admitting: Internal Medicine

## 2021-06-06 ENCOUNTER — Other Ambulatory Visit: Payer: Self-pay

## 2021-06-06 DIAGNOSIS — M25551 Pain in right hip: Secondary | ICD-10-CM

## 2021-06-06 DIAGNOSIS — M62838 Other muscle spasm: Secondary | ICD-10-CM

## 2021-06-06 DIAGNOSIS — M160 Bilateral primary osteoarthritis of hip: Secondary | ICD-10-CM | POA: Diagnosis not present

## 2021-06-09 MED ORDER — CYCLOBENZAPRINE HCL 10 MG PO TABS
ORAL_TABLET | ORAL | 0 refills | Status: DC
Start: 2021-06-09 — End: 2021-07-08

## 2021-06-10 ENCOUNTER — Other Ambulatory Visit: Payer: Self-pay | Admitting: *Deleted

## 2021-06-10 NOTE — Patient Instructions (Signed)
Visit Information  Mr. Manetta was given information about Medicaid Managed Care team care coordination services as a part of their [facility] Medicaid benefit. [name] verbally consented to engagement with the [facility] Managed Care team.   If you are experiencing a medical emergency, please call 911 or report to your local emergency department or urgent care.   If you have a non-emergency medical problem during routine business hours, please contact your provider's office and ask to speak with a nurse.   For questions related to your Healthy [facility] health plan, please call: [phone] or visit the homepage here: [email or web address]  If you would like to schedule transportation through your Healthy [facility] plan, please call the following number at least 2 days in advance of your appointment: [phone]  Call the [place] at [phone], at any time, 24 hours a day, 7 days a week. If you are in danger or need immediate medical attention call 911.  If you would like help to quit smoking, call 1-800-QUIT-NOW 815-568-3603) OR Espaol: 1-855-Djelo-Ya (7-253-664-4034) o para ms informacin haga clic aqu or Text READY to 200-400 to register via text  Mr. Bolander - following are the goals we discussed in your visit today:   Goals Addressed   Patient will self administer medications as prescribed Patient will attend all scheduled provider appointments Patient will call pharmacy for medication refills Patient will continue to perform ADL's independently Patient will continue to perform IADL's independently Patient will call provider office for new concerns or questions Patient will work with pharmacist  to discuss any medication concerns or questions    The patient verbalized understanding of instructions provided today and agreed to review patient instructions via My Chart.   The Managed Medicaid care  management team will reach out to the patient again over the next 30 days.   Kelli Churn RN, CCM, [place] Management Coordinator - Managed [place] High Risk [phone]   Following is a copy of your plan of care:  Care Plan : RN Care Manager Plan Of Care  Updates made by [name], RN since 06/10/2021 12:00 AM     Problem: Chronic Disease Management and Care Coordination Needs for DM, HTN, HLD, CKD      Long-Range Goal: Development of Plan Of Care For Chronic Disease Management and Care Coordination Needs to Assist With Meeting Treatment Goals for DM, CKD, HTN, obesity, chronic hip pain   Start Date: 01/07/2021  Expected End Date: 09/15/2021  Priority: High  Note:   Current Barriers:  Knowledge Deficits related to plan of care for management of HTN, HLD, CKD, and DMII, chronic pain- patient states he continues to receive steroid shots in hips alternating  each month and requires oxycodone to manage pain, says he completed his appointment with his primary care provider on 05/15/21  and he says he was called with lab results that were drawn on 12/22 and was told all were improved Chronic Disease Management support and education needs related to HTN, HLD, and DMII, chronic pain  RNCM Clinical Goal(s):  Patient will verbalize understanding of plan for management of HTN, HLD, CKD and DMII take all medications exactly as prescribed and will call provider for medication related questions attend all scheduled medical appointments:  demonstrate ongoing adherence to prescribed treatment plan for HTN, HLD, CKD and DMII as evidenced by daily monitoring and recording of fasting CBG,  adherence to ADA/ carb modified, fat modified,  no added salt diet, continue 30-40 minutes of exercise on your NuStep 5-7 days/week, adherence to prescribed medication regimen including new diabetes medication,  contacting provider for new or worsened symptoms or questions  related to HTN, DM or HLD, CKD continue to work with RN Care Manager to address care management and care coordination needs related to HTN, HLD, and DMII , CKD  Interventions: Inter-disciplinary care team collaboration (see longitudinal plan of care) Evaluation of current treatment plan related to  self management and patient's adherence to plan as established by provider   Chronic Kidney Disease (Status: Goal on Track (progressing): YES.) - patient reports his nephrologist was pleased that his creatinine has stabilized and that his BP reading during office visit on 06/05/21 was "good" , chart review shows BP= 130/70,  patient states he checks his BP once monthly and that readings are usually <140/<90, he says provider office readings are higher because he is in pain from the walk to the offices. 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 Last practice recorded BP readings:    BP Readings from Last 3 Encounters:  05/15/21 (!) 151/82  02/26/21 (!) 155/82  02/06/21 (!) 158/84        Most recent eGFR/CrCl:  Lab Results  Component Value Date   NA 137 05/15/2021   K 4.1 05/15/2021   CREATININE 1.63 (H) 05/15/2021   EGFR 51 (L) 05/15/2021   GFRNONAA 37 (L) 01/10/2020   GLUCOSE 128 (H) 05/15/2021     Reviewed medications with patient and discussed importance of compliance   Assessed frequency of self monitored BP, reviewed home and provider office readings, reviewed treatment targets and reviewed lifestyle strategies      Reviewed scheduled/upcoming provider appointments including:  primary care follow up on 05/15/21  Discussed plans with patient for ongoing care management follow up and provided patient with direct contact information for care management team      Diabetes:  (Status: Goal on track: YES.)- patient reports self monitored fasting CBGs are usually <130 Lab Results  Component Value Date   HGBA1C 7.0 (H) 05/15/2021   Assessed frequency of CBG testing and reviewed fasting target Reviewed medications with patient and discussed importance of medication adherence- ensured patient has increased Ozempic to 2 mg weekly Reminded patient of appointment with RD on 06/16/21 for MNT and weight management Discussed plans with patient for ongoing care management follow up and provided patient with direct contact information for care management team;      Reviewed scheduled/upcoming provider appointments including:  with Dietician on 06/16/21, for CT of hips on 06/27/21- ensured patient has transportation           Health Maintenance (Status: Goal on Track (progressing): YES.) - patient completed colonoscopy and endoscopy on 02/26/21 and was aware of pathology report- non cancerous Patient interviewed about adult health maintenance status   Pneumonia Vaccine-  received pneumococcal  vaccine on 05/15/21 Influenza Vaccine- states he received flu shot on 02/06/21 COVID vaccination   - pt states he will receive at next primary care appointment  Shingles Vaccination- received first dose on 02/06/21 Regular eye checkups- says he sees his eye doctor every 6 months due to glaucoma and his next appointment is in November  Advanced Directives- states he does not have and does not want information at this time        Hyperlipidemia:  (Status: Goal on Track (progressing): YES. Lab Results  Component Value Date   CHOL  162 05/15/2021   HDL 48 05/15/2021   LDLCALC 88 05/15/2021   TRIG 147 05/15/2021   CHOLHDL 6.1 (H) 12/20/2019      Medication review performed; medication list updated in electronic medical record.  Reviewed results of lipid panel drawn 05/15/21 with patient and positive reinforcement given for all elements including triglycerides within normal range   Hypertension: (Status: Goal on Track (progressing): YES.)-patient reports his BP at nephrologist office on 06/05/21 was "good" , chart review shows BP= 130/70,  patient  states he checks his BP once monthly and that readings are usually <140/<90, he says provider office readings are higher because he is in pain from the walk to the offices. 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 Last practice recorded BP readings:  BP Readings from Last 3 Encounters:  02/26/21 (!) 155/82  02/06/21 (!) 158/84  01/01/21 (!) 156/91     Most recent eGFR/CrCl:  Lab Results  Component Value Date   EGFR 55 (L) 01/02/2021    No components found for: CRCL  Evaluation of current treatment plan related to hypertension self management and patient's adherence to plan as established by provider;   Reviewed BP medications and assessed medication taking behavior ensuring he is taking Losartan that was recently added to his regimen Assessed frequency of self monitored blood press and readings, reviewed treatment targets Discussed plans with patient for ongoing care management follow up and provided patient with direct contact information for care management team; Reviewed scheduled/upcoming provider appointments including: colonoscopy appointment on 02/26/21, orthopedic appointment on 03/19/21 for left hip injection, primary care follow up on 04/09/21      Pain:  (Status: Goal on track: NO.)patient states bilateral hip pain continues and requires regular injections and oxycodone to control, he says he will have CT of hips in February and then surgery will be scheduled  Medications reviewed Reviewed provider established plan for pain management; Counseled on the importance of reporting any/all new or changed pain symptoms or management strategies to pain management provider; Positive reinforcement given to patient regarding ongoing weight loss to meet goal to qualify for bilateral hip surgery- ensured patient is taking semaglutide to 2 mg weekly to assist with weight loss and improve glycemic control so that he can proceed with bilateral  total hip replacement   Patient Goals/Self-Care Activities: Patient will self administer medications as prescribed Patient will attend all scheduled provider appointments Patient will call pharmacy for medication refills Patient will continue to perform ADL's independently Patient will continue to perform IADL's independently Patient will call provider office for new concerns or questions Patient will work with pharmacist  to discuss any medication concerns or questions

## 2021-06-10 NOTE — Patient Outreach (Signed)
Medicaid Managed Care   Nurse Care Manager Note  06/10/2021 Name:  [name] MRN:  [id number] DOB:  May 15, 1971  [name] is an 51 y.o. year old male who is a primary patient of [name] Larsson, NP.  The [facility] Managed Care Coordination team was consulted for assistance with:    HTN HLD DM CKD Stage 3 Obesity Chronic Pain  [name] was given information about Medicaid Managed Care Coordination team services today. [name] Patient agreed to services and verbal consent obtained.  Engaged with patient by telephone for follow up visit in response to provider referral for case management and/or care coordination services.   Assessments/Interventions:  Review of past medical history, allergies, medications, health status, including review of consultants reports, laboratory and other test data, was performed as part of comprehensive evaluation and provision of chronic care management services.  SDOH (Social Determinants of Health) assessments and interventions performed:   Care Plan  No Known Allergies  Medications Reviewed Today     Reviewed by [name] Larsson, NP (Nurse Practitioner) on 05/15/21 at 1412  Med List Status: <None>   Medication Order Taking? Sig Documenting Provider Last Dose Status Informant  atorvastatin (LIPITOR) 40 MG tablet [id number] Yes Take 1 tablet (40 mg total) by mouth daily. [name] Larsson, NP Taking Active            Med Note [name], [name]   Tue Feb 11, 2021  1:59 PM) Says he is still taking the 10 mg tablet; instructed him to increase to 40 mg daily per MD order of 01/14/21  blood glucose meter kit and supplies KIT [id number] Yes Test daily Dx  r73.03 [name] Deutscher, DO Taking Active Self  Blood Pressure Monitor KIT [id number] Yes 1 each by Does not apply route daily. [name] Larsson, NP Taking Active   cyclobenzaprine (FLEXERIL) 10 MG tablet [id number] Yes TAKE 1 TABLET BY MOUTH 2 TIMES A DAY AS NEEDED FOR MUSCLE SPASMS. [name] Larsson, NP  Taking Active   glucose blood test strip [id number] Yes Use as instructed. Can check up to 4 times daily. [name] Larsson, NP Taking Active   latanoprost (XALATAN) 0.005 % ophthalmic solution [id number] Yes Place 1 drop into both eyes at bedtime. [provider] Taking Active Self  losartan (COZAAR) 25 MG tablet [id number] Yes Take 1 tablet (25 mg total) by mouth daily. [name], MD Taking Active   NARCAN 4 MG/0.1ML LIQD nasal spray kit [id number] Yes Place 0.4 mg into the nose once. [provider] Taking Active Self  Oxycodone HCl 20 MG TABS [id number] Yes Take 20 mg by mouth every 6 (six) hours as needed (pain). [provider] Taking Active Self           Med Note [name] Jan 07, 2021 10:36 AM) [name] prn  Semaglutide, 2 MG/DOSE, 8 MG/3ML SOPN [id number] Yes Inject 2 mg as directed once a week. [name] Larsson, NP Taking Active   sildenafil (VIAGRA) 50 MG tablet [id number] Yes Take 1 tablet (50 mg total) by mouth daily as needed for erectile dysfunction. [name], MD Taking Active   timolol (BETIMOL) 0.5 % ophthalmic solution [id number] Yes Place 1 drop into both eyes in the morning. [provider] Taking Active Self            Patient Active Problem List   Diagnosis Date Noted   Immunization due 02/06/2021   Iron  deficiency anemia 12/30/2020   History of colonic polyps 12/30/2020   Unilateral primary osteoarthritis, right hip 08/13/2020   Unilateral primary osteoarthritis, left hip 08/13/2020   Muscle spasms of both lower extremities 05/23/2020   Need for immunization against influenza 03/06/2020   Primary osteoarthritis of both hips 07/10/2019   Bilateral primary osteoarthritis of hip 07/10/2019   Vitamin D deficiency 04/13/2019   Morbid obesity with body mass index (BMI) of 40.0 to 44.9 in adult ([name]) 04/13/2019   Depressive disorder 04/13/2019   Body mass index (BMI) 45.0-49.9, adult ([name]) 04/13/2019   Kidney disease,  chronic, stage III (GFR 30-59 ml/min) ([name]) 07/20/2017   History of cerebrovascular accident 07/20/2017   Gastroesophageal reflux disease 07/20/2017   Gastric bypass status for obesity 02/11/2017   Benzodiazepine dependence ([name]) 02/11/2017   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) 08/03/2008   Hyperlipidemia 08/03/2008   SLEEP APNEA 08/03/2008   Sleep apnea 08/03/2008    Care Plan : RN Care Manager Plan Of Care  Updates made by [name], RN since 06/10/2021 12:00 AM     Problem: Chronic Disease Management and Care Coordination Needs for DM, HTN, HLD, CKD      Long-Range Goal: Development of Plan Of Care For Chronic Disease Management and Care Coordination Needs to Assist With Meeting Treatment Goals for DM, CKD, HTN, obesity, chronic hip pain   Start Date: 01/07/2021  Expected End Date: 09/15/2021  Priority: High  Note:   Current Barriers:  Knowledge Deficits related to plan of care for management of HTN, HLD, CKD, and DMII, chronic pain- patient states he continues to receive steroid shots in hips alternating  each month and requires oxycodone to manage pain, says he completed his appointment with his primary care provider on 05/15/21  and he says he was called with lab results that were drawn on 12/22 and was told all were improved Chronic Disease Management support and education needs related to HTN, HLD, and DMII, chronic pain  RNCM Clinical Goal(s):  Patient will verbalize understanding of plan for management of HTN, HLD, CKD and DMII take all medications exactly as prescribed and will call provider for medication related questions attend all scheduled medical appointments:  demonstrate ongoing adherence to prescribed treatment plan for HTN, HLD, CKD and DMII as evidenced by daily monitoring and recording of fasting CBG,  adherence to ADA/ carb modified, fat modified, no added salt diet, continue 30-40 minutes of exercise on your NuStep 5-7 days/week,  adherence to prescribed medication regimen including new diabetes medication,  contacting provider for new or worsened symptoms or questions related to HTN, DM or HLD, CKD continue to work with RN Care Manager to address care management and care coordination needs related to HTN, HLD, and DMII , CKD  Interventions: Inter-disciplinary care team collaboration (see longitudinal plan of care) Evaluation of current treatment plan related to  self management and patient's adherence to plan as established by provider   Chronic Kidney Disease (Status: Goal on Track (progressing): YES.) - patient reports his nephrologist was pleased that his creatinine has stabilized and that his BP reading during office visit on 06/05/21 was "good" , chart review shows BP= 130/70,  patient states he checks his BP once monthly and that readings are usually <140/<90, he says provider office readings are higher because he is in pain from the walk to the offices. 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 Last practice recorded BP readings:  BP Readings from Last 3 Encounters:  05/15/21 (!) 151/82  02/26/21 (!) 155/82  02/06/21 (!) 158/84        Most recent eGFR/CrCl:  Lab Results  Component Value Date   NA 137 05/15/2021   K 4.1 05/15/2021   CREATININE 1.63 (H) 05/15/2021   EGFR 51 (L) 05/15/2021   GFRNONAA 37 (L) 01/10/2020   GLUCOSE 128 (H) 05/15/2021     Reviewed medications with patient and discussed importance of compliance   Assessed frequency of self monitored BP, reviewed home and provider office readings, reviewed treatment targets and reviewed lifestyle strategies      Reviewed scheduled/upcoming provider appointments including:  primary care follow up on 05/15/21  Discussed plans with patient for ongoing care management follow up and provided patient with direct contact information for care management team      Diabetes:  (Status: Goal on track:  YES.)- patient reports self monitored fasting CBGs are usually <130 Lab Results  Component Value Date   HGBA1C 7.0 (H) 05/15/2021  Assessed frequency of CBG testing and reviewed fasting target Reviewed medications with patient and discussed importance of medication adherence- ensured patient has increased Ozempic to 2 mg weekly Reminded patient of appointment with RD on 06/16/21 for MNT and weight management Discussed plans with patient for ongoing care management follow up and provided patient with direct contact information for care management team;      Reviewed scheduled/upcoming provider appointments including:  with Dietician on 06/16/21, for CT of hips on 06/27/21- ensured patient has transportation           Health Maintenance (Status: Goal on Track (progressing): YES.) - patient completed colonoscopy and endoscopy on 02/26/21 and was aware of pathology report- non cancerous Patient interviewed about adult health maintenance status   Pneumonia Vaccine-  received pneumococcal  vaccine on 05/15/21 Influenza Vaccine- states he received flu shot on 02/06/21 COVID vaccination   - pt states he will receive at next primary care appointment  Shingles Vaccination- received first dose on 02/06/21 Regular eye checkups- says he sees his eye doctor every 6 months due to glaucoma and his next appointment is in November  Advanced Directives- states he does not have and does not want information at this time        Hyperlipidemia:  (Status: Goal on Track (progressing): YES. Lab Results  Component Value Date   CHOL 162 05/15/2021   HDL 48 05/15/2021   LDLCALC 88 05/15/2021   TRIG 147 05/15/2021   CHOLHDL 6.1 (H) 12/20/2019      Medication review performed; medication list updated in electronic medical record.  Reviewed results of lipid panel drawn 05/15/21 with patient and positive reinforcement given for all elements including triglycerides within normal range   Hypertension: (Status: Goal on  Track (progressing): YES.)-patient reports his BP at nephrologist office on 06/05/21 was "good" , chart review shows BP= 130/70,  patient states he checks his BP once monthly and that readings are usually <140/<90, he says provider office readings are higher because he is in pain from the walk to the offices. 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 Last practice recorded BP readings:  BP Readings from Last 3 Encounters:  02/26/21 (!) 155/82  02/06/21 (!) 158/84  01/01/21 (!) 156/91     Most recent eGFR/CrCl:  Lab Results  Component Value Date   EGFR 55 (L) 01/02/2021    No components found for: CRCL  Evaluation of current treatment  plan related to hypertension self management and patient's adherence to plan as established by provider;   Reviewed BP medications and assessed medication taking behavior ensuring he is taking Losartan that was recently added to his regimen Assessed frequency of self monitored blood press and readings, reviewed treatment targets Discussed plans with patient for ongoing care management follow up and provided patient with direct contact information for care management team; Reviewed scheduled/upcoming provider appointments including: colonoscopy appointment on 02/26/21, orthopedic appointment on 03/19/21 for left hip injection, primary care follow up on 04/09/21      Pain:  (Status: Goal on track: NO.)patient states bilateral hip pain continues and requires regular injections and oxycodone to control, he says he will have CT of hips in February and then surgery will be scheduled  Medications reviewed Reviewed provider established plan for pain management; Counseled on the importance of reporting any/all new or changed pain symptoms or management strategies to pain management provider; Positive reinforcement given to patient regarding ongoing weight loss to meet goal to qualify for bilateral hip surgery- ensured  patient is taking semaglutide to 2 mg weekly to assist with weight loss and improve glycemic control so that he can proceed with bilateral total hip replacement   Patient Goals/Self-Care Activities: Patient will self administer medications as prescribed Patient will attend all scheduled provider appointments Patient will call pharmacy for medication refills Patient will continue to perform ADL's independently Patient will continue to perform IADL's independently Patient will call provider office for new concerns or questions Patient will work with pharmacist  to discuss any medication concerns or questions       Follow Up:  Patient agrees to Care Plan and Follow-up.  Plan: The Managed Medicaid care management team will reach out to the patient again over the next 30 days.  Date/time of next scheduled RN care management/care coordination outreach:  07/11/21 at 10:00 am  Kelli Churn RN, CCM, [facility] Management Coordinator - Managed [place] High Risk [phone]

## 2021-06-13 DIAGNOSIS — M1612 Unilateral primary osteoarthritis, left hip: Secondary | ICD-10-CM | POA: Diagnosis not present

## 2021-06-16 ENCOUNTER — Encounter: Payer: Medicaid Other | Attending: Nurse Practitioner | Admitting: Nutrition

## 2021-06-16 ENCOUNTER — Encounter: Payer: Self-pay | Admitting: Nutrition

## 2021-06-16 ENCOUNTER — Other Ambulatory Visit: Payer: Self-pay

## 2021-06-16 DIAGNOSIS — I10 Essential (primary) hypertension: Secondary | ICD-10-CM | POA: Insufficient documentation

## 2021-06-16 DIAGNOSIS — N1832 Chronic kidney disease, stage 3b: Secondary | ICD-10-CM | POA: Diagnosis not present

## 2021-06-16 DIAGNOSIS — E782 Mixed hyperlipidemia: Secondary | ICD-10-CM | POA: Diagnosis not present

## 2021-06-16 DIAGNOSIS — Z6841 Body Mass Index (BMI) 40.0 and over, adult: Secondary | ICD-10-CM | POA: Insufficient documentation

## 2021-06-16 DIAGNOSIS — E1121 Type 2 diabetes mellitus with diabetic nephropathy: Secondary | ICD-10-CM | POA: Diagnosis not present

## 2021-06-16 DIAGNOSIS — Z9884 Bariatric surgery status: Secondary | ICD-10-CM | POA: Insufficient documentation

## 2021-06-16 NOTE — Progress Notes (Signed)
Medical Nutrition Therapy  Appointment Start time:  62  Appointment End time:  27  Primary concerns today: Dm Type 2, Obesity, CKD  Referral diagnosis: E11.8, E66.9, N18.3 Preferred learning style: no preference.  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 Highest wt. 590's lbs. Lowest he got down to was 245 lbs. Scheduled to have both hips surgery in the near future. Limited walking. Walking with a cane. Had gastric bypass in 2010. Type 2 DM. A1C 7%.  CDK. Stg 3b. Hyperlipidemia. Not checking bloods sugars very often. Current diet is insuffient to meet his needs nutritionally for his DM, weight loss and overall health. Diet is low in fresh fruits, vegetables, whole grains and high in high fat and sodium foods-cheese and bacon.  Anthropometrics  Wt Readings from Last 3 Encounters:  05/15/21 277 lb (125.6 kg)  02/26/21 279 lb 0.7 oz (126.6 kg)  02/06/21 279 lb 0.6 oz (126.6 kg)   Ht Readings from Last 3 Encounters:  05/15/21 5\' 8"  (1.727 m)  02/26/21 5\' 8"  (1.727 m)  02/06/21 5\' 8"  (1.727 m)   There is no height or weight on file to calculate BMI. @BMIFA @ Facility age limit for growth percentiles is 20 years. Facility age limit for growth percentiles is 20 years.    Clinical Medical Hx: DM Type 2, Obesity, CKD,  Medications: Ozempic weekly 2 mg. Labs: Doesn't check BS on regular basis.  FBS was 160 mg/dl. Lab Results  Component Value Date   HGBA1C 7.0 (H) 05/15/2021   CMP Latest Ref Rng & Units 05/15/2021 01/02/2021 01/10/2020  Glucose 70 - 99 mg/dL 128(H) 165(H) 130  BUN 6 - 24 mg/dL 25(H) 18 38(H)  Creatinine 0.76 - 1.27 mg/dL 1.63(H) 1.52(H) 2.05(H)  Sodium 134 - 144 mmol/L 137 135 137  Potassium 3.5 - 5.2 mmol/L 4.1 4.6 4.4  Chloride 96 - 106 mmol/L 97 93(L) 103  CO2 20 - 29 mmol/L 26 24 25   Calcium 8.7 - 10.2 mg/dL 9.1 10.0 9.4  Total Protein 6.0 - 8.5 g/dL 6.9 6.9 6.6  Total Bilirubin 0.0 - 1.2 mg/dL 0.7 1.1 0.8  Alkaline  Phos 44 - 121 IU/L 109 104 -  AST 0 - 40 IU/L 24 23 20   ALT 0 - 44 IU/L 27 18 20    Lipid Panel     Component Value Date/Time   CHOL 162 05/15/2021 1425   TRIG 147 05/15/2021 1425   HDL 48 05/15/2021 1425   CHOLHDL 6.1 (H) 12/20/2019 1011   LDLCALC 88 05/15/2021 1425   LDLCALC 121 (H) 12/20/2019 1011   LABVLDL 26 05/15/2021 1425   Notable Signs/Symptoms: Hips hurt, limited mobility  Lifestyle & Dietary Hx Lives by himself. Cooks mostly at home. Needing to get his hips replaced. Dr. Bosie Clos in [place] to get his surgery done. Not working now. On disabiliity. Has cravings with sweets.Working on cutting them out of his house. Likes cheese.   Estimated daily fluid intake: 90-100 oz Supplements: VMI,  Sleep: 6-7 Stress / self-care: none Current average weekly physical activity: ADL  24-Hr Dietary Recall First Meal: Egg, 1 slice toast, 2 slices bacon, water Snack:  Second Meal: 1/2 chicken sandwich, Snack: none Third Meal: 6-7 pm Pimento cheese 1/2 sandwich, water, apple Snack: none Beverages: water  Estimated Energy Needs Calories: 1200 Carbohydrate: 135 Pro: 90 Fat: 33g   NUTRITION DIAGNOSIS  NB-1.1 Food and nutrition-related knowledge deficit As related to Diabetes Type 2, Obesity.  As evidenced by A1C  7% and BMI > 30.   NUTRITION INTERVENTION  Nutrition education (E-1) on the following topics:  Nutrition and Diabetes education provided on My Plate, CHO counting, meal planning, portion sizes, timing of meals, avoiding snacks between meals unless having a low blood sugar, target ranges for A1C and blood sugars, signs/symptoms and treatment of hyper/hypoglycemia, monitoring blood sugars, taking medications as prescribed, benefits of exercising 30 minutes per day and prevention of complications of DM.  - Whole Food, Plant Predominant Nutrition is highly recommended: Eat Plenty of vegetables, Mushrooms, fruits, Legumes, Whole Grains, Nuts, seeds in lieu of processed  meats, processed snacks/pastries red meat, poultry, eggs.    -It is better to avoid simple carbohydrates including: Cakes, Sweet Desserts, Ice Cream, Soda (diet and regular), Sweet Tea, Candies, Chips, Cookies, Store Bought Juices, Alcohol in Excess of  1-2 drinks a day, Lemonade,  Artificial Sweeteners, Doughnuts, Coffee Creamers, "Sugar-free" Products, etc, etc.  This is not a complete list.....  Exercise: If you are able: 30 -60 minutes a day ,4 days a week, or 150 minutes a week.  The longer the better.  Combine stretch, strength, and aerobic activities.  If you were told in the past that you have high risk for cardiovascular diseases, you may seek evaluation by your heart doctor prior to initiating moderate to intense exercise programs.  Handouts Provided Include  Lifestyle Medicine Plant based my plate Meal Plan Card  Learning Style & Readiness for Change Teaching method utilized: Visual & Auditory  Demonstrated degree of understanding via: Teach Back  Barriers to learning/adherence to lifestyle change: mobility and need for hip surgery bilaterally.   Goals Established by Pt   Eat 2 carbs per meals Follow more plant based foods. Get back to [facility], 3 times per week. Don't drink liquids 15 mins  before or 30 mins a meals Increase protein rich foods. Test blood sugars twice a day; before breakfast and before bed. Get A1C to 6.5% or less.  Avoid processed foods.   Lifestyle Medicine - Whole Food, Plant Predominant Nutrition is highly recommended: Eat Plenty of vegetables, Mushrooms, fruits, Legumes, Whole Grains, Nuts, seeds in lieu of processed meats, processed snacks/pastries red meat, poultry, eggs.    -It is better to avoid simple carbohydrates including: Cakes, Sweet Desserts, Ice Cream, Soda (diet and regular), Sweet Tea, Candies, Chips, Cookies, Store Bought Juices, Alcohol in Excess of  1-2 drinks a day, Lemonade,  Artificial Sweeteners, Doughnuts, Coffee Creamers,  "Sugar-free" Products, etc, etc.  This is not a complete list.....  Exercise: If you are able: 30 -60 minutes a day ,4 days a week, or 150 minutes a week.  The longer the better.  Combine stretch, strength, and aerobic activities.  If you were told in the past that you have high risk for cardiovascular diseases, you may seek evaluation by your heart doctor prior to initiating moderate to intense exercise programs.   MONITORING & EVALUATION Dietary intake, weekly physical activity, and weight and blood sugar  in 2 months.  Next Steps  Patient is to work on meal planning, more plant based foods and weight loss.Marland Kitchen

## 2021-06-16 NOTE — Patient Instructions (Addendum)
Goals  Eat 2 carbs per meals Follow more plant based foods. Get back to [facility], 3 times per week. Don't drink liquids 15 mins  before or 30 mins a meals Increase protein rich foods. Test blood sugars twice a day; before breakfast and before bed. Get A1C to 6.5% or less.  Lifestyle Medicine - Whole Food, Plant Predominant Nutrition is highly recommended: Eat Plenty of vegetables, Mushrooms, fruits, Legumes, Whole Grains, Nuts, seeds in lieu of processed meats, processed snacks/pastries red meat, poultry, eggs.    -It is better to avoid simple carbohydrates including: Cakes, Sweet Desserts, Ice Cream, Soda (diet and regular), Sweet Tea, Candies, Chips, Cookies, Store Bought Juices, Alcohol in Excess of  1-2 drinks a day, Lemonade,  Artificial Sweeteners, Doughnuts, Coffee Creamers, "Sugar-free" Products, etc, etc.  This is not a complete list.....  Exercise: If you are able: 30 -60 minutes a day ,4 days a week, or 150 minutes a week.  The longer the better.  Combine stretch, strength, and aerobic activities.  If you were told in the past that you have high risk for cardiovascular diseases, you may seek evaluation by your heart doctor prior to initiating moderate to intense exercise programs.

## 2021-06-18 ENCOUNTER — Telehealth: Payer: Self-pay

## 2021-06-18 NOTE — Telephone Encounter (Signed)
Received medical clearance form from [facility] for left hip total replacement  Copied Noted Appt scheduled 01.26.2023 @ 2:00 pm

## 2021-06-19 ENCOUNTER — Ambulatory Visit: Payer: Medicaid Other | Admitting: Nurse Practitioner

## 2021-06-19 ENCOUNTER — Other Ambulatory Visit: Payer: Self-pay

## 2021-06-19 ENCOUNTER — Encounter: Payer: Self-pay | Admitting: Nurse Practitioner

## 2021-06-19 VITALS — BP 116/76 | HR 87 | Resp 16 | Ht 68.0 in | Wt 270.1 lb

## 2021-06-19 DIAGNOSIS — Z6841 Body Mass Index (BMI) 40.0 and over, adult: Secondary | ICD-10-CM | POA: Diagnosis not present

## 2021-06-19 DIAGNOSIS — Z01818 Encounter for other preprocedural examination: Secondary | ICD-10-CM | POA: Insufficient documentation

## 2021-06-19 DIAGNOSIS — E1121 Type 2 diabetes mellitus with diabetic nephropathy: Secondary | ICD-10-CM | POA: Diagnosis not present

## 2021-06-19 DIAGNOSIS — I452 Bifascicular block: Secondary | ICD-10-CM | POA: Diagnosis not present

## 2021-06-19 DIAGNOSIS — E782 Mixed hyperlipidemia: Secondary | ICD-10-CM

## 2021-06-19 NOTE — Assessment & Plan Note (Signed)
Patient in a stable condition.  Vital signs are stable. 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  Patient planning for for hip surgery. Patient referred to cardiology for clearance

## 2021-06-19 NOTE — Patient Instructions (Addendum)
You have been referred to cardiology due to your abnormal EKG.  It is important that you exercise regularly at least 30 minutes 5 times a week.  Think about what you will eat, plan ahead. Choose " clean, green, fresh or frozen" over canned, processed or packaged foods which are more sugary, salty and fatty. 70 to 75% of food eaten should be vegetables and fruit. Three meals at set times with snacks allowed between meals, but they must be fruit or vegetables. Aim to eat over a 12 hour period , example 7 am to 7 pm, and STOP after  your last meal of the day. Drink water,generally about 64 ounces per day, no other drink is as healthy. Fruit juice is best enjoyed in a healthy way, by EATING the fruit.  Thanks for choosing [facility], we consider it a privelige to serve you.

## 2021-06-19 NOTE — Assessment & Plan Note (Signed)
Wt Readings from Last 3 Encounters:  06/19/21 270 lb 1.9 oz (122.5 kg)  05/15/21 277 lb (125.6 kg)  02/26/21 279 lb 0.7 oz (126.6 kg)  Importance of healthy food choices with portion control discussed as well as eating regularly within 12  hour window.   The need to choose clean green food 50%-75% of time is discussed as well as make water the primary drink and set a goal for 64 ounces daily.  Patient reeducated about the importance of committment to minimum of 150 minutes of exercise per week.  Three meals at set times with snacks allowed between meals but they must be fruit or vegetable.   Aim to eat  over 12 hour period  for example 7 am to 7 pm. Stop after your last meal of the day.

## 2021-06-19 NOTE — Assessment & Plan Note (Signed)
Patient not cleared for surgery today based on his EKG readings. Patient referred to cardiology for cardiac clearance. Plan explained to the patient he verbalized understanding. Patient denies chest pain palpitation shortness of breath, vital signs stable in the office today. Recent labs are stable.

## 2021-06-19 NOTE — Progress Notes (Signed)
° °  EZIO WIECK     MRN: [id number]      DOB: Apr 25, 1971   HPI Mr. Goren is here for surgical clearance. Pt is going for left hip replacement with Dr Chevis Pretty. Pt denies any new complaints today. Pt stated that nephrology is aware that he is going for surgery.   ROS Denies recent fever or chills. Denies sinus pressure, nasal congestion, ear pain or sore throat. Denies chest congestion, productive cough or wheezing. Denies chest pains, palpitations and leg swelling Denies abdominal pain, nausea, vomiting,diarrhea or constipation.   Denies dysuria, frequency, hesitancy or incontinence. Has bilateral hip joint pain joint pain,with some limitation in mobility pain is 5/10.  Denies headaches, seizures, numbness, or tingling. Denies depression, anxiety or insomnia. Denies skin break down or rash.   PE  BP 116/76    Pulse 87    Resp 16    Ht 5\' 8"  (1.727 m)    Wt 270 lb 1.9 oz (122.5 kg)    SpO2 98%    BMI 41.07 kg/m   Patient alert and oriented and in no cardiopulmonary distress.  Chest: Clear to auscultation bilaterally.  CVS: S1, S2 no murmurs, no S3.Regular rate.  ABD: Soft non tender.   Ext: No edema  MS: adequate ROM of  shoulders,Limited ROM of spine and hips , uses a cane voiced tenderness of bilateral hips  ambulation   Skin: Intact, no ulcerations or rash noted.  Psych: Good eye contact, normal affect. Memory intact not anxious or depressed appearing.     Assessment & Plan

## 2021-06-19 NOTE — Assessment & Plan Note (Addendum)
Lab Results  Component Value Date   CHOL 162 05/15/2021   HDL 48 05/15/2021   LDLCALC 88 05/15/2021   TRIG 147 05/15/2021   CHOLHDL 6.1 (H) 12/20/2019  atorvastatin 40mg  daily. Will recheck labs at next visit LDL not at goal

## 2021-06-19 NOTE — Assessment & Plan Note (Signed)
Lab Results  Component Value Date   HGBA1C 7.0 (H) 05/15/2021   Condition is well controlled.  Continue current medications

## 2021-06-20 ENCOUNTER — Telehealth: Payer: Self-pay | Admitting: *Deleted

## 2021-06-20 NOTE — Telephone Encounter (Signed)
° °  Pre-operative Risk Assessment    Patient Name: [name]  DOB: 20-Jan-1971 MRN: [id number]      Request for Surgical Clearance    Procedure:   LEFT TOTAL HIP REPLACEMENT  Date of Surgery:  Clearance TBD                                 Surgeon:  DR. Edmonia Lynch Surgeon's Group or Practice Name:  [facility] Phone number:  [phone] Fax number:  [phone] ATTN: KELLY ext [phone]   Type of Clearance Requested:   - Medical    Type of Anesthesia:   CHOICE   Additional requests/questions:    Jiles Prows   06/20/2021, 2:30 PM

## 2021-06-20 NOTE — Telephone Encounter (Signed)
° °  Name: [name]  DOB: January 01, 1971  MRN: [id number]  Primary Cardiologist: None  Chart reviewed as part of pre-operative protocol coverage.  Will review surgical clearance during new provider appointment on 06/26/21.   [place], [place]  06/20/2021, 2:58 PM

## 2021-06-26 ENCOUNTER — Other Ambulatory Visit: Payer: Self-pay | Admitting: Orthopedic Surgery

## 2021-06-26 ENCOUNTER — Encounter: Payer: Self-pay | Admitting: Cardiology

## 2021-06-26 ENCOUNTER — Other Ambulatory Visit: Payer: Self-pay

## 2021-06-26 ENCOUNTER — Ambulatory Visit: Payer: Medicaid Other | Admitting: Cardiology

## 2021-06-26 ENCOUNTER — Other Ambulatory Visit: Payer: Self-pay | Admitting: Family Medicine

## 2021-06-26 VITALS — BP 110/76 | HR 92 | Ht 68.0 in | Wt 269.0 lb

## 2021-06-26 DIAGNOSIS — M25552 Pain in left hip: Secondary | ICD-10-CM

## 2021-06-26 DIAGNOSIS — Z01810 Encounter for preprocedural cardiovascular examination: Secondary | ICD-10-CM | POA: Diagnosis not present

## 2021-06-26 DIAGNOSIS — M25551 Pain in right hip: Secondary | ICD-10-CM

## 2021-06-26 DIAGNOSIS — I452 Bifascicular block: Secondary | ICD-10-CM

## 2021-06-26 DIAGNOSIS — R0602 Shortness of breath: Secondary | ICD-10-CM

## 2021-06-26 NOTE — Patient Instructions (Addendum)
Testing/Procedures: Your physician has requested that you have a lexiscan myoview. For further information please visit [email or web address]. Please follow instruction sheet, as given.   Follow-Up: Follow up with Dr. Harl Bowie is PENDING  If you need a refill on your cardiac medications before your next appointment, please call your pharmacy.

## 2021-06-26 NOTE — Progress Notes (Addendum)
° ° ° °Clinical Summary °[name] is a 51 y.o.male seen today as a new consult, referred by NP Paseda for the following medical problems.  ° ° °1.Abnormal EKG °- RBBB/LAFB dates back at least to 2016, perhaps longer as EKGs start at that time in epic °- 01/2019 echo LVEF 60-65%, mod LVH, grade I dd,  ° ° ° °2.Preoperative evaluation °- considering left hip replacement °- walks at [employer] occasionally.  °- severely limited by hip pain. Uses a cane or walker when ambulating ° ° °Past Medical History:  °Diagnosis Date  ° ALLERGY 08/03/2008  ° Qualifier: Diagnosis of  By: Stallings, Angela    ° Anemia   ° Arthritis   ° "hips" (01/15/2015)  ° Blood transfusion without reported diagnosis   ° Cellulitis 07/25/2018  ° Chronic back pain greater than 3 months duration 02/11/2017  ° Chronic lower back pain   ° Colon polyps   ° COLONIC POLYPS, ADENOMATOUS 08/03/2008  ° Qualifier: Diagnosis of  By: Stallings, Angela    ° CVA 08/03/2008  ° Qualifier: History of  By: Stallings, Angela    ° Depression   ° denies  ° Diabetes mellitus   ° diet controlled- no meds since wt loss  ° Diabetes mellitus (HCC) 07/10/2019  ° Dysrhythmia   ° s/p surgery bariatric- cardioversion  ° Erectile dysfunction 01/26/2018  ° GERD (gastroesophageal reflux disease)   ° HIATAL HERNIA 08/03/2008  ° Qualifier: Diagnosis of  By: Stallings, Angela    ° Hip pain   ° History of gout   ° Hyperlipidemia   ° Hypertension   ° Joint pain   ° Narcotic dependence (HCC) 02/11/2017  ° Neuromuscular disorder (HCC)   ° PONV (postoperative nausea and vomiting)   ° Pre-diabetes   ° Prediabetes 03/29/2017  ° Sleep apnea   ° no longer have to use cpap since wt loss  ° Stage 3 chronic kidney disease (HCC) 01/26/2018  ° Stroke (HCC) 2006  ° denies residual on 01/15/2015  ° 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 (HCC) 08/01/2018  ° ° ° °No Known Allergies ° ° °Current Outpatient Medications  °Medication Sig Dispense Refill  ° atorvastatin (LIPITOR) 40 MG tablet Take 1 tablet (40 mg  total) by mouth daily. 90 tablet 3  ° blood glucose meter kit and supplies KIT Test daily °Dx  r73.03 1 each 0  ° Blood Pressure Monitor KIT 1 each by Does not apply route daily. 1 kit 0  ° cyclobenzaprine (FLEXERIL) 10 MG tablet TAKE 1 TABLET BY MOUTH 2 TIMES A DAY AS NEEDED FOR MUSCLE SPASMS. 60 tablet 0  ° glucose blood test strip Use as instructed. Can check up to 4 times daily. 100 each 3  ° latanoprost (XALATAN) 0.005 % ophthalmic solution Place 1 drop into both eyes at bedtime.    ° losartan (COZAAR) 25 MG tablet Take 1 tablet (25 mg total) by mouth daily. 90 tablet 0  ° NARCAN 4 MG/0.1ML LIQD nasal spray kit Place 0.4 mg into the nose once.    ° Oxycodone HCl 20 MG TABS Take 20 mg by mouth every 6 (six) hours as needed (pain).    ° Semaglutide, 2 MG/DOSE, 8 MG/3ML SOPN Inject 2 mg as directed once a week. 3 mL 11  ° sildenafil (VIAGRA) 50 MG tablet Take 1 tablet (50 mg total) by mouth daily as needed for erectile dysfunction. 20 tablet 0  ° timolol (BETIMOL) 0.5 % ophthalmic solution Place 1 drop   into both eyes in the morning.    ° °No current facility-administered medications for this visit.  ° ° ° °Past Surgical History:  °Procedure Laterality Date  ° BARIATRIC SURGERY  2010  ° in [place]  ° BIOPSY  02/26/2021  ° Procedure: BIOPSY;  Surgeon: Castaneda Mayorga, Daniel, MD;  [facility];  Service: Gastroenterology;;  ° CARDIOVERSION    ° COLONOSCOPY N/A 08/02/2014  ° Procedure: COLONOSCOPY;  Surgeon: Najeeb U Rehman, MD;  [facility];  Service: Endoscopy;  Laterality: N/A;  210 - moved to 3/10 @ 11:55 - Ann notified pt  ° COLONOSCOPY WITH PROPOFOL N/A 02/26/2021  ° Procedure: COLONOSCOPY WITH PROPOFOL;  Surgeon: Castaneda Mayorga, Daniel, MD;  [facility]: [facility];  Service: Gastroenterology;  Laterality: N/A;  10:00  ° ESOPHAGOGASTRODUODENOSCOPY    ° ESOPHAGOGASTRODUODENOSCOPY (EGD) WITH PROPOFOL N/A 02/26/2021  ° Procedure: ESOPHAGOGASTRODUODENOSCOPY (EGD) WITH PROPOFOL;  Surgeon:  Castaneda Mayorga, Daniel, MD;  [facility]: [facility];  Service: Gastroenterology;  Laterality: N/A;  ° HERNIA REPAIR    ° LIPOSUCTION    ° PANNICULECTOMY  01/14/2015  ° PANNICULECTOMY N/A 01/14/2015  ° Procedure:  TOTAL PANNICULECTOMY WTH LIPOSUCTION OF SIDES;  Surgeon: Gerald Truesdale, MD;  Location: [facility];  Service: Plastics;  Laterality: N/A;  ° POLYPECTOMY  02/26/2021  ° Procedure: POLYPECTOMY INTESTINAL;  Surgeon: Castaneda Mayorga, Daniel, MD;  [facility]: [facility];  Service: Gastroenterology;;  ° TONSILLECTOMY    ° VENTRAL HERNIA REPAIR  01/14/2015  ° VENTRAL HERNIA REPAIR N/A 01/14/2015  ° Procedure: HERNIA REPAIR VENTRAL ADULT AND MUSCLE REPAIR;  Surgeon: Gerald Truesdale, MD;  Location: [facility];  Service: Plastics;  Laterality: N/A;  ° ° ° °No Known Allergies ° ° ° °Family History  °Problem Relation Age of Onset  ° COPD Mother   ° Depression Mother   ° Hyperlipidemia Mother   ° Hypertension Mother   ° Heart disease Mother   ° Thyroid disease Mother   ° Anxiety disorder Mother   ° Diabetes Father   ° Pulmonary embolism Father 56  °     blood clot  ° Hyperlipidemia Father   ° Sudden death Father   ° Obesity Father   ° Eating disorder Father   ° Hypertension Brother   ° ° ° °Social History °[name] reports that he has never smoked. He has never used smokeless tobacco. °[name] reports no history of alcohol use. ° ° °Review of Systems °CONSTITUTIONAL: No weight loss, fever, chills, weakness or fatigue.  °HEENT: Eyes: No visual loss, blurred vision, double vision or yellow sclerae.No hearing loss, sneezing, congestion, runny nose or sore throat.  °SKIN: No rash or itching.  °CARDIOVASCULAR: per hpi °RESPIRATORY: No shortness of breath, cough or sputum.  °GASTROINTESTINAL: No anorexia, nausea, vomiting or diarrhea. No abdominal pain or blood.  °GENITOURINARY: No burning on urination, no polyuria °NEUROLOGICAL: No headache, dizziness, syncope, paralysis, ataxia, numbness or tingling in the extremities. No  change in bowel or bladder control.  °MUSCULOSKELETAL: No muscle, back pain, joint pain or stiffness.  °LYMPHATICS: No enlarged nodes. No history of splenectomy.  °PSYCHIATRIC: No history of depression or anxiety.  °ENDOCRINOLOGIC: No reports of sweating, cold or heat intolerance. No polyuria or polydipsia.  °. ° ° °Physical Examination °Today's Vitals  ° 06/26/21 1030 06/26/21 1038  °BP: 108/70 110/76  °Pulse: 92   °SpO2: 99%   °Weight: 269 lb (122 kg)   °Height: 5' 8" (1.727 m)   ° °Body mass index   is 40.9 kg/m². ° °Gen: resting comfortably, no acute distress °HEENT: no scleral icterus, pupils equal round and reactive, no palptable cervical adenopathy,  °CV: RRR, no m/r/g, no jvd °Resp: Clear to auscultation bilaterally °GI: abdomen is soft, non-tender, non-distended, normal bowel sounds, no hepatosplenomegaly °MSK: extremities are warm, no edema.  °Skin: warm, no rash °Neuro:  no focal deficits °Psych: appropriate affect ° ° ° ° ° °Assessment and Plan  °1.Abnormal EKG °- long standing RBBB/LAFB on EKG at least since 2016 °- signs of conduction disease but not clinically significant °- monitor at this time ° °2. Preoperative evaluation °- being considered for hip replacement °- no able to assess function status by history, severely limited by hip pain °- plan for lexiscan to further risk stratify.  ° ° °07/04/21 addendum °Low risk stress test, recommend proceeding with surgery as planned ° ° °Jonathan F. Branch, M.D. °

## 2021-06-27 ENCOUNTER — Ambulatory Visit
Admission: RE | Admit: 2021-06-27 | Discharge: 2021-06-27 | Disposition: A | Discharge status: Home / Self Care | Payer: Medicaid Other | Source: Ambulatory Visit | Attending: Orthopedic Surgery | Admitting: Orthopedic Surgery

## 2021-06-27 DIAGNOSIS — M25552 Pain in left hip: Secondary | ICD-10-CM

## 2021-06-27 DIAGNOSIS — M25551 Pain in right hip: Secondary | ICD-10-CM

## 2021-06-27 DIAGNOSIS — Z01818 Encounter for other preprocedural examination: Secondary | ICD-10-CM | POA: Diagnosis not present

## 2021-06-27 DIAGNOSIS — M247 Protrusio acetabuli: Secondary | ICD-10-CM | POA: Diagnosis not present

## 2021-06-27 DIAGNOSIS — Z9889 Other specified postprocedural states: Secondary | ICD-10-CM | POA: Diagnosis not present

## 2021-06-27 DIAGNOSIS — M1611 Unilateral primary osteoarthritis, right hip: Secondary | ICD-10-CM | POA: Diagnosis not present

## 2021-06-27 DIAGNOSIS — M1612 Unilateral primary osteoarthritis, left hip: Secondary | ICD-10-CM | POA: Diagnosis not present

## 2021-07-01 DIAGNOSIS — M25552 Pain in left hip: Secondary | ICD-10-CM | POA: Diagnosis not present

## 2021-07-01 DIAGNOSIS — Z79899 Other long term (current) drug therapy: Secondary | ICD-10-CM | POA: Diagnosis not present

## 2021-07-01 DIAGNOSIS — Z5181 Encounter for therapeutic drug level monitoring: Secondary | ICD-10-CM | POA: Diagnosis not present

## 2021-07-01 DIAGNOSIS — M25551 Pain in right hip: Secondary | ICD-10-CM | POA: Diagnosis not present

## 2021-07-02 ENCOUNTER — Encounter (HOSPITAL_COMMUNITY)
Admission: RE | Admit: 2021-07-02 | Discharge: 2021-07-02 | Disposition: A | Discharge status: Home / Self Care | Payer: Medicaid Other | Source: Ambulatory Visit | Attending: Cardiology | Admitting: Cardiology

## 2021-07-02 ENCOUNTER — Other Ambulatory Visit: Payer: Self-pay

## 2021-07-02 ENCOUNTER — Ambulatory Visit (HOSPITAL_COMMUNITY)
Admission: RE | Admit: 2021-07-02 | Discharge: 2021-07-02 | Disposition: A | Discharge status: Home / Self Care | Payer: Medicaid Other | Source: Ambulatory Visit | Attending: Cardiology | Admitting: Cardiology

## 2021-07-02 DIAGNOSIS — R0602 Shortness of breath: Secondary | ICD-10-CM | POA: Diagnosis not present

## 2021-07-02 LAB — NM MYOCAR MULTI W/SPECT W/WALL MOTION / EF
Base ST Depression (mm): 0 mm
LV dias vol: 91 mL (ref 62–150)
LV sys vol: 29 mL
Nuc Stress EF: 68 %
Peak HR: 90 {beats}/min
RATE: 0.4
Rest HR: 81 {beats}/min
Rest Nuclear Isotope Dose: 11 mCi
SDS: 6
SRS: 5
SSS: 11
ST Depression (mm): 0 mm
Stress Nuclear Isotope Dose: 32 mCi
TID: 0.98

## 2021-07-02 MED ORDER — SODIUM CHLORIDE FLUSH 0.9 % IV SOLN
INTRAVENOUS | Status: AC
  Administered 2021-07-02: 10 mL via INTRAVENOUS
  Filled 2021-07-02: qty 10

## 2021-07-02 MED ORDER — TECHNETIUM TC 99M TETROFOSMIN IV KIT
10.0000 | PACK | Freq: Once | INTRAVENOUS | Status: AC | PRN
  Administered 2021-07-02: 11 via INTRAVENOUS

## 2021-07-02 MED ORDER — TECHNETIUM TC 99M TETROFOSMIN IV KIT
30.0000 | PACK | Freq: Once | INTRAVENOUS | Status: AC | PRN
  Administered 2021-07-02: 32 via INTRAVENOUS

## 2021-07-02 MED ORDER — REGADENOSON 0.4 MG/5ML IV SOLN
INTRAVENOUS | Status: AC
  Administered 2021-07-02: 0.4 mg via INTRAVENOUS
  Filled 2021-07-02: qty 5

## 2021-07-08 ENCOUNTER — Other Ambulatory Visit: Payer: Self-pay | Admitting: Internal Medicine

## 2021-07-08 ENCOUNTER — Telehealth: Payer: Self-pay

## 2021-07-08 DIAGNOSIS — M62838 Other muscle spasm: Secondary | ICD-10-CM

## 2021-07-08 NOTE — Telephone Encounter (Signed)
Pt notified and verbalized understanding. Pt had no questions or concerns at this time.  

## 2021-07-08 NOTE — Telephone Encounter (Signed)
-----   Message from Laurine Blazer, LPN sent at 08/14/5670  2:30 PM EST -----  ----- Message ----- From: Arnoldo [name], MD Sent: 07/06/2021   9:18 AM EST To: Laurine Blazer, LPN  Stress test is overall low risk. Very small area that may represent a very small blockage but so small would not be considered of significant risk, the vast majority of the heart is gettng pleny of blood. Would recommend proceeding with surgery as planned. Can f/u 3 months   Zandra Abts MD

## 2021-07-11 ENCOUNTER — Telehealth: Payer: Self-pay | Admitting: *Deleted

## 2021-07-11 ENCOUNTER — Ambulatory Visit: Payer: Self-pay

## 2021-07-11 NOTE — Patient Outreach (Addendum)
Care Coordination  07/11/2021  [name] 10/05/1970 [id number]  07/11/2021 Name: [name] MRN: [id number] DOB: July 27, 1970  Referred by: [name], FNP Reason for referral : High Risk Managed Medicaid (Unsuccessful RNCM follow up telephone outreach)   An unsuccessful telephone outreach was attempted today. The patient was referred to the case management team for assistance with care management and care coordination.    Follow Up Plan: A HIPAA compliant phone message was left for the patient providing contact information and requesting a return call.  Also advised patient on the message this RNCM will reschedule the follow up phone call next week if he is unable to return call before 12:30 pm since this RNCM's work day is over at 12:30 pm on Fridays.   Kelli Churn RN, CCM, [facility] Care Management Coordinator - Managed [place] High Risk [phone]

## 2021-07-11 NOTE — Patient Instructions (Signed)
[name] ,   The [facility] Managed Care Team is available to provide assistance to you with your healthcare needs at no cost and as a benefit of your [facility] Health plan.  If you are unable to return my call by 12:30 pm today, this RNCM will reschedule your telephone appointment to next week.  Please check My Chart for the telephone appointment date and time.   Thank you,   Kelli Churn RN, CCM, [facility] Care Management Coordinator - Managed [place] High Risk [phone]

## 2021-07-14 ENCOUNTER — Ambulatory Visit: Payer: Self-pay

## 2021-07-14 ENCOUNTER — Telehealth: Payer: Self-pay | Admitting: *Deleted

## 2021-07-14 NOTE — Patient Outreach (Signed)
Care Coordination  07/14/2021  SHREY BOIKE 1970-07-19 [id number]  07/14/2021 Name: [name] MRN: [id number] DOB: [birth year]  Referred by: [name], FNP Reason for referral : High Risk Managed Medicaid (Unsuccessful 2nd RNCM follow up telephone outreach)   A second unsuccessful telephone outreach was attempted today. The patient was referred to the case management team for assistance with care management and care coordination.    Follow Up Plan: The Managed Medicaid care management team will reach out to the patient again over the next 7 days.    Kelli Churn RN, CCM, [facility] Care Management Coordinator - Managed [place] High Risk [phone]

## 2021-07-14 NOTE — Patient Instructions (Signed)
[name] ,   The [facility] Managed Care Team is available to provide assistance to you with your healthcare needs at no cost and as a benefit of your [facility] Health plan. I'm sorry I was unable to reach you today for our scheduled appointment. Our care guide will call you to reschedule our telephone appointment. Please call me at the number below. I am available to be of assistance to you regarding your healthcare needs. .   Thank you,   Kelli Churn RN, CCM, [facility] Care Management Coordinator - Managed [place] High Risk [phone]

## 2021-07-15 ENCOUNTER — Other Ambulatory Visit: Payer: Self-pay | Admitting: *Deleted

## 2021-07-15 NOTE — Patient Outreach (Signed)
Care Coordination  07/15/2021  ALEXY HELDT May 14, 1971 [id number]  Spoke with patient via phone to assess for any pharmacy needs since there is a brief interruption in pharmacy services and patient had a telephone appointment with the Managed Medicaid pharmacist today.  Reviewed medications with patient, assessed for any adverse medication side effects, ensured no refills are needed. Patient had no identified pharmacy needs.   Kelli Churn RN, CCM, [facility] Care Management Coordinator - Managed [place] High Risk [phone]

## 2021-07-15 NOTE — Patient Instructions (Signed)
Visit Information  [name] was given information about Medicaid Managed Care team care coordination services as a part of their Healthy [facility] Medicaid benefit. [name] verbally consented to engagement with the [facility] Managed Care team.   If you are experiencing a medical emergency, please call 911 or report to your local emergency department or urgent care.   If you have a non-emergency medical problem during routine business hours, please contact your provider's office and ask to speak with a nurse.   For questions related to your Healthy [facility] health plan, please call: [phone] or visit the homepage here: [email or web address]  If you would like to schedule transportation through your Healthy [facility] plan, please call the following number at least 2 days in advance of your appointment: [phone]  For information about your ride after you set it up, call Ride Assist at [phone]. Use this number to activate a Will Call pickup, or if your transportation is late for a scheduled pickup. Use this number, too, if you need to make a change or cancel a previously scheduled reservation.  If you need transportation services right away, call [phone]. The after-hours call center is staffed 24 hours to handle ride assistance and urgent reservation requests (including discharges) 365 days a year. Urgent trips include sick visits, hospital discharge requests and life-sustaining treatment.  Call the [place] at [phone], at any time, 24 hours a day, 7 days a week. If you are in danger or need immediate medical attention call 911.  If you would like help to quit smoking, call 1-800-QUIT-NOW 727-144-0156) OR Espaol: 1-855-Djelo-Ya (9-935-701-7793) o para ms informacin haga clic aqu or Text READY to 200-400 to register via text  [name] - following are the goals we discussed in your visit today:   Goals  Addressed   Take medications as prescribed   Attend all scheduled provider appointments Call pharmacy for medication refills 3-7 days in advance of running out of medications Perform all self care activities independently  Perform IADL's (shopping, preparing meals, housekeeping, managing finances) independently Call provider office for new concerns or questions  Call RNCM [name] at [phone] when hip surgery is scheduled    Patient verbalizes understanding of instructions and care plan provided today and agrees to view in [place]. Active MyChart status confirmed with patient.    The Managed Medicaid care management team will reach out to the patient again over the next 30 days.   [name] Churn RN, CCM, [place] Management Coordinator - Managed [place] High Risk [phone]   Following is a copy of your plan of care:  Care Plan : [name]  Updates made by [name], RN since 07/15/2021 12:00 AM     Problem: Chronic Disease Management and Care Coordination Needs for DM, HTN, HLD, CKD      Long-Range Goal: Development of Plan Of Care For Chronic Disease Management and Care Coordination Needs to Assist With Meeting Treatment Goals for DM, CKD, HTN, obesity, chronic hip pain   Start Date: 01/07/2021  Expected End Date: 09/15/2021  Priority: High  Note:   Current Barriers:  Knowledge Deficits related to plan of care for management of HTN, HLD, CKD, and DMII, chronic pain- patient states he continues to receive steroid shots in hips alternating  each month and requires oxycodone to manage pain, says his next injection is scheduled for 07/23/21, says he is hoping to get  scheduled for bilateral hip surgery soon and that surgeon would now like his weight to be 260 lbs or less, says he completed his appointment with his new primary care provider on 06/19/21 and that she saw "something she didn't like on his EKG" so he was  referred to a cardiologist and saw him on 06/26/21, had a stress test on 07/02/21 and was cleared for surgery Chronic Disease Management support and education needs related to HTN, HLD, and DMII, chronic pain  RNCM Clinical Goal(s):  Patient will verbalize understanding of plan for management of HTN, HLD, CKD and DMII take all medications exactly as prescribed and will call provider for medication related questions attend all scheduled medical appointments:  demonstrate ongoing adherence to prescribed treatment plan for HTN, HLD, CKD and DMII as evidenced by daily/ twice daily monitoring and recording of CBG,  adherence to ADA/ carb modified, fat modified, no added salt diet, continue 30-40 minutes of exercise on your NuStep 5-7 days/week, adherence to prescribed medication regimen including new diabetes medication,  contacting provider for new or worsened symptoms or questions related to HTN, DM or HLD, CKD continue to work with RN Care Manager to address care management and care coordination needs related to HTN, HLD, and DMII , CKD  Interventions: Inter-disciplinary care team collaboration (see longitudinal plan of care) Evaluation of current treatment plan related to  self management and patient's adherence to plan as established by provider   Chronic Kidney Disease (Status: Condition stable. Not addressed this visit.) - patient reports his nephrologist was pleased that his creatinine has stabilized and that his BP reading during office visit on 06/05/21 was "good" , chart review shows BP= 130/70,  patient states he checks his BP occasionally and that readings are usually <130/<80, he says provider office readings are higher at times because he is in pain from the walk to the offices. 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 Last practice recorded BP readings:    BP Readings from Last 3 Encounters:  06/26/21 110/76  06/19/21 116/76  05/15/21  (!) 151/82         Most recent eGFR/CrCl:  Lab Results  Component Value Date   NA 137 05/15/2021   K 4.1 05/15/2021   CREATININE 1.63 (H) 05/15/2021   EGFR 51 (L) 05/15/2021   GFRNONAA 37 (L) 01/10/2020   GLUCOSE 128 (H) 05/15/2021     Reviewed medications with patient and discussed importance of compliance   Assessed frequency of self monitored BP, reviewed home and provider office readings, reviewed treatment targets and reviewed lifestyle strategies      Reviewed scheduled/upcoming provider appointments including:  for MNT with [name] on 08/19/21, with primary care follow up on  09/18/21 and with cardiologist on 09/26/21 Discussed plans with patient for ongoing care management follow up and provided patient with direct contact information for care management team      Diabetes:  (Status: Goal on track: YES.)- patient reports self monitored fasting CBGs are usually <130 Lab Results  Component Value Date   HGBA1C 7.0 (H) 05/15/2021  Assessed frequency of CBG testing and reviewed fasting target Reviewed medications with patient and discussed importance of medication adherence- ensured patient has increased Ozempic to 2 mg weekly Reminded patient of follow up appointment with RD, CDCES on 08/19/21 for MNT and weight management- ensured patient has transportation Discussed plans with patient for ongoing care management follow up and provided patient with direct contact information for  care management team;               Health Maintenance (Status: Condition stable. Not addressed this visit.) - patient completed colonoscopy and endoscopy on 02/26/21 and was aware of pathology report- non cancerous Patient interviewed about adult health maintenance status   Pneumonia Vaccine-  received pneumococcal  vaccine on 05/15/21 Influenza Vaccine- states he received flu shot on 02/06/21 COVID vaccination   - pt states he will receive at next primary care appointment  Shingles Vaccination-  received first dose on 02/06/21 Regular eye checkups- says he sees his eye doctor every 6 months due to glaucoma and his next appointment is in November  Advanced Directives- states he does not have and does not want information at this time        Hyperlipidemia:  (Status: Condition stable. Not addressed this visit. Lab Results  Component Value Date   CHOL 162 05/15/2021   HDL 48 05/15/2021   LDLCALC 88 05/15/2021   TRIG 147 05/15/2021   CHOLHDL 6.1 (H) 12/20/2019      Medication review performed; medication list updated in electronic medical record.    Hypertension: (Status: Goal on Track (progressing): YES.)-patient states he checks his BP occasionally and that readings are usually <130/<80, he says provider office readings are higher at times because he is in pain from the walk to the offices. 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 Last practice recorded BP readings:  BP Readings from Last 3 Encounters:  06/26/21 110/76  06/19/21 116/76  05/15/21 (!) 151/82   Most recent eGFR/CrCl:  Lab Results  Component Value Date   EGFR 55 (L) 01/02/2021    No components found for: CRCL  Evaluation of current treatment plan related to hypertension self management and patient's adherence to plan as established by provider;   Reviewed BP medications and assessed medication taking behavior ensuring he is taking Losartan that was recently added to his regimen Assessed frequency of self monitored blood press and readings, reviewed treatment targets Discussed plans with patient for ongoing care management follow up and provided patient with direct contact information for care management team; Reviewed scheduled/upcoming provider appointments including: for MNT with [name] on 08/19/21, with primary care follow up on  09/18/21 and with cardiologist on 09/26/21   Pain:  (Status: Goal on track: NO.)patient states bilateral hip pain continues  and requires regular injections and oxycodone to control Medications reviewed Reviewed provider established plan for pain management; Counseled on the importance of reporting any/all new or changed pain symptoms or management strategies to pain management provider; Positive reinforcement given to patient regarding ongoing weight loss to meet goal to qualify for bilateral hip surgery- ensured patient is taking semaglutide to 2 mg weekly to assist with weight loss and improve glycemic control so that he can proceed with bilateral total hip replacement   Patient Goals/Self-Care Activities: Take medications as prescribed   Attend all scheduled provider appointments Call pharmacy for medication refills 3-7 days in advance of running out of medications Perform all self care activities independently  Perform IADL's (shopping, preparing meals, housekeeping, managing finances) independently Call provider office for new concerns or questions  Call RNCM [name] at [phone] when hip surgery is scheduled

## 2021-07-15 NOTE — Patient Outreach (Signed)
Medicaid Managed Care   Nurse Care Manager Note  07/15/2021 Name:  KHALEEF RUBY MRN:  [id number] DOB:  Aug 09, 1970  JUSTAN GAEDE is an 51 y.o. year old male who is a primary patient of [name], FNP.  The [facility] Managed Care Coordination team was consulted for assistance with:    HTN HLD DMI CKD Stage 3 Obesity Chronic Pain  Mr. Medlen was given information about Medicaid Managed Care Coordination team services today. [name] Patient agreed to services and verbal consent obtained.  Engaged with patient by telephone for follow up visit in response to provider referral for case management and/or care coordination services.   Assessments/Interventions:  Review of past medical history, allergies, medications, health status, including review of consultants reports, laboratory and other test data, was performed as part of comprehensive evaluation and provision of chronic care management services.  SDOH (Social Determinants of Health) assessments and interventions performed:   Care Plan  No Known Allergies  Medications Reviewed Today     Reviewed by [name], RN (Registered Nurse) on 07/15/21 at 1004  Med List Status: <None>   Medication Order Taking? Sig Documenting Provider Last Dose Status Informant  atorvastatin (LIPITOR) 40 MG tablet [id number]  Take 1 tablet (40 mg total) by mouth daily. [name], NP  Active            Med Note [name], [name]   Tue Feb 11, 2021  1:59 PM) Says he is still taking the 10 mg tablet; instructed him to increase to 40 mg daily per MD order of 01/14/21  blood glucose meter kit and supplies KIT [id number]  Test daily Dx  r73.03 [name], DO  Active Self  Blood Pressure Monitor KIT [id number]  1 each by Does not apply route daily. [name], NP  Active   cyclobenzaprine (FLEXERIL) 10 MG tablet [id number]  TAKE 1 TABLET BY MOUTH 2 TIMES A DAY AS NEEDEDFOR MUSCLE SPASMS. [name], MD  Active   glucose blood  test strip [id number]  Use as instructed. Can check up to 4 times daily. [name], NP  Active   latanoprost (XALATAN) 0.005 % ophthalmic solution [id number]  Place 1 drop into both eyes at bedtime. [provider]  Active Self  losartan (COZAAR) 25 MG tablet [id number]  Take 1 tablet (25 mg total) by mouth daily. [name], MD  Active   NARCAN 4 MG/0.1ML LIQD nasal spray kit [id number]  Place 0.4 mg into the nose once. [provider]  Active Self  Oxycodone HCl 20 MG TABS [id number]  Take 20 mg by mouth every 6 (six) hours as needed (pain). [provider]  Active Self           Med Note [name]   Tue Jan 07, 2021 10:36 AM) [name] prn  Semaglutide, 2 MG/DOSE, 8 MG/3ML SOPN [id number]  Inject 2 mg as directed once a week. [name], NP  Active   sildenafil (VIAGRA) 50 MG tablet [id number]  Take 1 tablet (50 mg total) by mouth daily as needed for erectile dysfunction. [name], MD  Active   timolol (BETIMOL) 0.5 % ophthalmic solution [id number]  Place 1 drop into both eyes in the morning. [provider]  Active Self            Patient Active Problem List   Diagnosis Date Noted   Pre-operative clearance 06/19/2021   Right bundle branch  block (RBBB) with left anterior fascicular block (LAFB) 06/19/2021   Immunization due 02/06/2021   Iron deficiency anemia 12/30/2020   History of colonic polyps 12/30/2020   Unilateral primary osteoarthritis, right hip 08/13/2020   Unilateral primary osteoarthritis, left hip 08/13/2020   Muscle spasms of both lower extremities 05/23/2020   Need for immunization against influenza 03/06/2020   Primary osteoarthritis of both hips 07/10/2019   Bilateral primary osteoarthritis of hip 07/10/2019   Vitamin D deficiency 04/13/2019   Morbid obesity with body mass index (BMI) of 40.0 to 44.9 in adult ([place]) 04/13/2019   Depressive disorder 04/13/2019   Body mass index (BMI) 45.0-49.9, adult ([place])  04/13/2019   Kidney disease, chronic, stage III (GFR 30-59 ml/min) ([place]) 07/20/2017   History of cerebrovascular accident 07/20/2017   Gastroesophageal reflux disease 07/20/2017   Gastric bypass status for obesity 02/11/2017   Benzodiazepine dependence ([place]) 02/11/2017   Type 2 diabetes mellitus with diabetic nephropathy ([place]) 08/03/2008   Hyperlipidemia 08/03/2008   SLEEP APNEA 08/03/2008   Sleep apnea 08/03/2008    Care Plan : RN Care Manager Plan Of Care  Updates made by [name], RN since 07/15/2021 12:00 AM     Problem: Chronic Disease Management and Care Coordination Needs for DM, HTN, HLD, CKD      Long-Range Goal: Development of Plan Of Care For Chronic Disease Management and Care Coordination Needs to Assist With Meeting Treatment Goals for DM, CKD, HTN, obesity, chronic hip pain   Start Date: 01/07/2021  Expected End Date: 09/15/2021  Priority: High  Note:   Current Barriers:  Knowledge Deficits related to plan of care for management of HTN, HLD, CKD, and DMII, chronic pain- patient states he continues to receive steroid shots in hips alternating  each month and requires oxycodone to manage pain, says his next injection is scheduled for 07/23/21, says he is hoping to get scheduled for bilateral hip surgery soon and that surgeon would now like his weight to be 260 lbs or less, says he completed his appointment with his new primary care provider on 06/19/21 and that she saw "something she didn't like on his EKG" so he was referred to a cardiologist and saw him on 06/26/21, had a stress test on 07/02/21 and was cleared for surgery Chronic Disease Management support and education needs related to HTN, HLD, and DMII, chronic pain  RNCM Clinical Goal(s):  Patient will verbalize understanding of plan for management of HTN, HLD, CKD and DMII take all medications exactly as prescribed and will call provider for medication related questions attend all scheduled medical appointments:   demonstrate ongoing adherence to prescribed treatment plan for HTN, HLD, CKD and DMII as evidenced by daily/ twice daily monitoring and recording of CBG,  adherence to ADA/ carb modified, fat modified, no added salt diet, continue 30-40 minutes of exercise on your NuStep 5-7 days/week, adherence to prescribed medication regimen including new diabetes medication,  contacting provider for new or worsened symptoms or questions related to HTN, DM or HLD, CKD continue to work with RN Care Manager to address care management and care coordination needs related to HTN, HLD, and DMII , CKD  Interventions: Inter-disciplinary care team collaboration (see longitudinal plan of care) Evaluation of current treatment plan related to  self management and patient's adherence to plan as established by provider   Chronic Kidney Disease (Status: Condition stable. Not addressed this visit.) - patient reports his nephrologist was pleased that his creatinine has stabilized and that his BP  reading during office visit on 06/05/21 was "good" , chart review shows BP= 130/70,  patient states he checks his BP occasionally and that readings are usually <130/<80, he says provider office readings are higher at times because he is in pain from the walk to the offices. 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 Last practice recorded BP readings:    BP Readings from Last 3 Encounters:  06/26/21 110/76  06/19/21 116/76  05/15/21 (!) 151/82         Most recent eGFR/CrCl:  Lab Results  Component Value Date   NA 137 05/15/2021   K 4.1 05/15/2021   CREATININE 1.63 (H) 05/15/2021   EGFR 51 (L) 05/15/2021   GFRNONAA 37 (L) 01/10/2020   GLUCOSE 128 (H) 05/15/2021     Reviewed medications with patient and discussed importance of compliance   Assessed frequency of self monitored BP, reviewed home and provider office readings, reviewed treatment targets and reviewed lifestyle  strategies      Reviewed scheduled/upcoming provider appointments including:  for MNT with [name] on 08/19/21, with primary care follow up on  09/18/21 and with cardiologist on 09/26/21 Discussed plans with patient for ongoing care management follow up and provided patient with direct contact information for care management team      Diabetes:  (Status: Goal on track: YES.)- patient reports self monitored fasting CBGs are usually <130 Lab Results  Component Value Date   HGBA1C 7.0 (H) 05/15/2021  Assessed frequency of CBG testing and reviewed fasting target Reviewed medications with patient and discussed importance of medication adherence- ensured patient has increased Ozempic to 2 mg weekly Reminded patient of follow up appointment with RD, [facility] on 08/19/21 for MNT and weight management- ensured patient has transportation Discussed plans with patient for ongoing care management follow up and provided patient with direct contact information for care management team;               Health Maintenance (Status: Condition stable. Not addressed this visit.) - patient completed colonoscopy and endoscopy on 02/26/21 and was aware of pathology report- non cancerous Patient interviewed about adult health maintenance status   Pneumonia Vaccine-  received pneumococcal  vaccine on 05/15/21 Influenza Vaccine- states he received flu shot on 02/06/21 COVID vaccination   - pt states he will receive at next primary care appointment  Shingles Vaccination- received first dose on 02/06/21 Regular eye checkups- says he sees his eye doctor every 6 months due to glaucoma and his next appointment is in November  Advanced Directives- states he does not have and does not want information at this time        Hyperlipidemia:  (Status: Condition stable. Not addressed this visit. Lab Results  Component Value Date   CHOL 162 05/15/2021   HDL 48 05/15/2021   LDLCALC 88 05/15/2021   TRIG 147 05/15/2021   CHOLHDL 6.1  (H) 12/20/2019      Medication review performed; medication list updated in electronic medical record.    Hypertension: (Status: Goal on Track (progressing): YES.)-patient states he checks his BP occasionally and that readings are usually <130/<80, he says provider office readings are higher at times because he is in pain from the walk to the offices. 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 Last practice recorded BP readings:  BP Readings from Last 3 Encounters:  06/26/21 110/76  06/19/21 116/76  05/15/21 (!) 151/82   Most  recent eGFR/CrCl:  Lab Results  Component Value Date   EGFR 55 (L) 01/02/2021    No components found for: CRCL  Evaluation of current treatment plan related to hypertension self management and patient's adherence to plan as established by provider;   Reviewed BP medications and assessed medication taking behavior ensuring he is taking Losartan that was recently added to his regimen Assessed frequency of self monitored blood press and readings, reviewed treatment targets Discussed plans with patient for ongoing care management follow up and provided patient with direct contact information for care management team; Reviewed scheduled/upcoming provider appointments including: for MNT with [name] on 08/19/21, with primary care follow up on  09/18/21 and with cardiologist on 09/26/21   Pain:  (Status: Goal on track: NO.)patient states bilateral hip pain continues and requires regular injections and oxycodone to control Medications reviewed Reviewed provider established plan for pain management; Counseled on the importance of reporting any/all new or changed pain symptoms or management strategies to pain management provider; Positive reinforcement given to patient regarding ongoing weight loss to meet goal to qualify for bilateral hip surgery- ensured patient is taking semaglutide to 2 mg weekly to assist with weight  loss and improve glycemic control so that he can proceed with bilateral total hip replacement   Patient Goals/Self-Care Activities: Take medications as prescribed   Attend all scheduled provider appointments Call pharmacy for medication refills 3-7 days in advance of running out of medications Perform all self care activities independently  Perform IADL's (shopping, preparing meals, housekeeping, managing finances) independently Call provider office for new concerns or questions  Call RNCM Kelli Churn at [phone] when hip surgery is scheduled       Follow Up:  Patient agrees to Care Plan and Follow-up.  Plan: The Managed Medicaid care management team will reach out to the patient again over the next 30 days.  Date/time of next scheduled RN care management/care coordination outreach:  08/12/21 at 10:30 am  Kelli Churn RN, CCM, [place] Management Coordinator - Managed [place] High Risk [phone]

## 2021-07-23 ENCOUNTER — Encounter: Payer: Self-pay | Admitting: Nurse Practitioner

## 2021-07-23 DIAGNOSIS — M1611 Unilateral primary osteoarthritis, right hip: Secondary | ICD-10-CM | POA: Diagnosis not present

## 2021-07-24 ENCOUNTER — Encounter: Payer: Self-pay | Admitting: Cardiology

## 2021-07-24 ENCOUNTER — Telehealth: Payer: Self-pay

## 2021-07-24 NOTE — Telephone Encounter (Signed)
Patient calling to see if Kristin Bruins has seen his heart care results for the heart stress done.  Call back # [phone] ?

## 2021-07-24 NOTE — Telephone Encounter (Signed)
Do you know if we have received these yet? ?

## 2021-07-24 NOTE — Telephone Encounter (Signed)
Sent mychart message

## 2021-07-25 NOTE — Telephone Encounter (Signed)
2nd set up Surgical Clearance ? ?Please see Cardiology Note in Epic from 06-26-21 ?

## 2021-08-01 ENCOUNTER — Encounter: Payer: Self-pay | Admitting: Nurse Practitioner

## 2021-08-01 ENCOUNTER — Ambulatory Visit: Payer: Medicaid Other | Admitting: Nurse Practitioner

## 2021-08-01 ENCOUNTER — Other Ambulatory Visit: Payer: Self-pay

## 2021-08-01 VITALS — BP 107/76 | HR 94 | Ht 68.0 in | Wt 270.0 lb

## 2021-08-01 DIAGNOSIS — E1121 Type 2 diabetes mellitus with diabetic nephropathy: Secondary | ICD-10-CM

## 2021-08-01 DIAGNOSIS — Z01818 Encounter for other preprocedural examination: Secondary | ICD-10-CM | POA: Diagnosis not present

## 2021-08-01 NOTE — Assessment & Plan Note (Signed)
Lab Results  ?Component Value Date  ? HGBA1C 7.0 (H) 05/15/2021  ? ?Takes semaglutide 2 mg once weekly injection. ?Continue current medication ?Recheck A1c in 2 months ?Patient encouraged to avoid sweets soda juice. ? ?

## 2021-08-01 NOTE — Patient Instructions (Signed)
Good luck in your surgery  ? ? ?It is important that you exercise regularly at least 30 minutes 5 times a week.  ?Think about what you will eat, plan ahead. ?Choose " clean, green, fresh or frozen" over canned, processed or packaged foods which are more sugary, salty and fatty. ?70 to 75% of food eaten should be vegetables and fruit. ?Three meals at set times with snacks allowed between meals, but they must be fruit or vegetables. ?Aim to eat over a 12 hour period , example 7 am to 7 pm, and STOP after  your last meal of the day. ?Drink water,generally about 64 ounces per day, no other drink is as healthy. Fruit juice is best enjoyed in a healthy way, by EATING the fruit. ? ?Thanks for choosing [facility], we consider it a privelige to serve you.  ?

## 2021-08-01 NOTE — Progress Notes (Signed)
? ?  [name]     MRN: [id number]      DOB: [birth year] ? ? ?HPI ?[name] with medical history of type 2 diabetes with diabetic nephropathy, primary osteoarthritis of both hips, stage III chronic kidney disease, hyperlipidemia is here for surgical clearance for hip surgery.  Patient has upcoming surgery for his bilateral hip osteoarthritis, states that he will be having his left hip surgery done first.  Patient was recently evaluated by cardiology and we have received clearance from cardiology for patient to proceed with surgery.  Today patient rates his bilateral hip pain as 6/10, he takes pain medication as needed.  ? ?Patient states that he has been taking all medications as prescribed. ?Patient denies any new complaints today ? ? ? ?ROS ?Denies recent fever or chills. ?Denies sinus pressure, nasal congestion, ear pain or sore throat. ?Denies chest congestion, productive cough or wheezing. ?Denies chest pains, palpitations and leg swelling ?Denies abdominal pain, nausea, vomiting,diarrhea or constipation.   ?Denies dysuria, frequency, hesitancy or incontinence. ?Has bilateral hip joint pain, limitation in mobility. ?Denies headaches, seizures, numbness, or tingling. ?Denies depression, anxiety or insomnia. ? ? ? ?PE ? ?BP 107/76 (BP Location: Right Arm, Cuff Size: Large)   Pulse 94   Ht '5\' 8"'$  (1.727 m)   Wt 270 lb (122.5 kg)   SpO2 97%   BMI 41.05 kg/m?  ? ?Patient alert and oriented and in no cardiopulmonary distress. ? ?Chest: Clear to auscultation bilaterally. ? ?CVS: S1, S2 no murmurs, no S3.Regular rate. ? ?ABD: Soft non tender.  ? ?MS: decreased  ROM spine, shoulders, hips and knees, uses a cane for ambulation ? ?Skin: Intact, no ulcerations or rash noted. ? ?Psych: Good eye contact, normal affect. Memory intact not anxious or depressed appearing. ? ? ? ?Assessment & Plan ? ?Type 2 diabetes mellitus with diabetic nephropathy ([place]) ?Lab Results  ?Component Value Date  ? HGBA1C 7.0 (H) 05/15/2021   ? ?Takes semaglutide 2 mg once weekly injection. ?Continue current medication ?Recheck A1c in 2 months ?Patient encouraged to avoid sweets soda juice. ? ? ?Pre-operative clearance ?Patient recently evaluated by cardiology due to abnormal  EKG. patient deemed okay for surgery after cardiology evaluation. ?Please hydrate patient before and during surgery.  ?A1c is under control, hypertension under control. ? ?Note from cardiology.Stress test is overall low risk. Very small area that may represent a very small blockage but so small would not be considered of significant risk, the vast majority of the heart is gettng pleny of blood. Would recommend proceeding with surgery as planned. Can f/u 3 months ?  ? ?

## 2021-08-01 NOTE — Assessment & Plan Note (Addendum)
Patient recently evaluated by cardiology due to abnormal  EKG. patient deemed okay for surgery after cardiology evaluation. ?Please hydrate patient before and during surgery.  ?A1c is under control, hypertension under control. ? ?Note from cardiology.Stress test is overall low risk. Very small area that may represent a very small blockage but so small would not be considered of significant risk, the vast majority of the heart is gettng pleny of blood. Would recommend proceeding with surgery as planned. Can f/u 3 months ?? ?

## 2021-08-04 ENCOUNTER — Encounter: Payer: Self-pay | Admitting: Nurse Practitioner

## 2021-08-04 ENCOUNTER — Other Ambulatory Visit: Payer: Self-pay | Admitting: Internal Medicine

## 2021-08-04 NOTE — Telephone Encounter (Signed)
Please advise 

## 2021-08-05 ENCOUNTER — Other Ambulatory Visit: Payer: Self-pay

## 2021-08-05 DIAGNOSIS — M160 Bilateral primary osteoarthritis of hip: Secondary | ICD-10-CM

## 2021-08-12 ENCOUNTER — Other Ambulatory Visit: Payer: Self-pay | Admitting: *Deleted

## 2021-08-12 NOTE — Patient Outreach (Signed)
?Medicaid Managed Care   ?Nurse Care Manager Note ? ?08/12/2021 ?Name:  [name] MRN:  [id number] DOB:  [birth year] ? ?[name] is an 51 y.o. year old male who is a primary patient of [name], FNP.  The [facility] Managed Care Coordination team was consulted for assistance with:    ?HTN ?HLD ?DMI ?CKD Stage 3 ?Chronic Pain- bilateral hips sec to OA ?Obesity ? ?[name] was given information about Medicaid Managed Care Coordination team services today. [name] Patient agreed to services and verbal consent obtained. ? ?Engaged with patient by telephone for follow up visit in response to provider referral for case management and/or care coordination services.  ? ?Assessments/Interventions:  Review of past medical history, allergies, medications, health status, including review of consultants reports, laboratory and other test data, was performed as part of comprehensive evaluation and provision of chronic care management services. ? ?SDOH (Social Determinants of Health) assessments and interventions performed: ? ? ?Care Plan ? ?No Known Allergies ? ?Medications Reviewed Today   ? ? Reviewed by [name], RN (Registered Nurse) on 08/12/21 at [place] List Status: <None>  ? ?Medication Order Taking? Sig Documenting Provider Last Dose Status Informant  ?atorvastatin (LIPITOR) 40 MG tablet [id number] No Take 1 tablet (40 mg total) by mouth daily. [name], NP Taking Active   ?         ?Med Note [name], [name]   Tue Feb 11, 2021  1:59 PM) Says he is still taking the 10 mg tablet; instructed him to increase to 40 mg daily per MD order of 01/14/21  ?blood glucose meter kit and supplies KIT [id number] No Test daily ?Dx  r73.03 [name], DO Taking Active Self  ?Blood Pressure Monitor KIT [id number] No 1 each by Does not apply route daily. [name], NP Taking Active   ?cyclobenzaprine (FLEXERIL) 10 MG tablet [id number] No TAKE 1 TABLET BY MOUTH 2 TIMES A DAY AS NEEDEDFOR MUSCLE  SPASMS. [name], MD Taking Active   ?cyclobenzaprine (FLEXERIL) 10 MG tablet [id number] No Take by mouth. [provider] Taking Active   ?cyclobenzaprine (FLEXERIL) 10 MG tablet [id number]  TAKE 1 TABLET BY MOUTH 2 TIMES A DAY AS NEEDED FOR MUSCLE SPASMS. [name], FNP  Active   ?gabapentin (NEURONTIN) 300 MG capsule [id number] No Take 300 mg by mouth daily. [provider] Taking Active   ?gabapentin (NEURONTIN) 300 MG capsule [id number] No Take by mouth. [provider] Taking Active   ?glucose blood test strip [id number] No Use as instructed. Can check up to 4 times daily. [name], NP Taking Active   ?latanoprost (XALATAN) 0.005 % ophthalmic solution [id number] No Place 1 drop into both eyes at bedtime. [provider] Taking Active Self  ?losartan (COZAAR) 25 MG tablet [id number] No Take 1 tablet (25 mg total) by mouth daily. [name], MD Taking Active   ?NARCAN 4 MG/0.1ML LIQD nasal spray kit [id number] No Place 0.4 mg into the nose once. [provider] Taking Active Self  ?         ?Med Note [name], [name]   Fri Aug 01, 2021  8:39 AM) As needed  ?Oxycodone HCl 20 MG TABS [id number] No Take 20 mg by mouth every 6 (six) hours as needed (pain). [provider] Taking Active Self  ?         ?Med Note [name], [name]   Tue Jan 07, 2021 10:36 AM) Takes prn  ?Semaglutide, 2 MG/DOSE, 8 MG/3ML SOPN [id number] No Inject 2 mg as directed once a week. [name], NP Taking Active   ?sildenafil (VIAGRA) 50 MG tablet [id number] No Take 1 tablet (50 mg total) by mouth daily as needed for erectile dysfunction. [name], MD Taking Active   ?sildenafil (VIAGRA) 50 MG tablet [id number] No Take by mouth. [provider] Taking Active   ?timolol (BETIMOL) 0.5 % ophthalmic solution [id number] No Place 1 drop into both eyes in the morning. [provider] Taking Active Self  ? ?  ?  ? ?  ? ? ?Patient Active Problem List  ?  Diagnosis Date Noted  ? Pre-operative clearance 06/19/2021  ? 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  ? Immunization due 02/06/2021  ? Iron deficiency anemia 12/30/2020  ? History of colonic polyps 12/30/2020  ? Unilateral primary osteoarthritis, right hip 08/13/2020  ? Unilateral primary osteoarthritis, left hip 08/13/2020  ? Muscle spasms of both lower extremities 05/23/2020  ? Need for immunization against influenza 03/06/2020  ? Primary osteoarthritis of both hips 07/10/2019  ? Bilateral primary osteoarthritis of hip 07/10/2019  ? Vitamin D deficiency 04/13/2019  ? Morbid obesity with body mass index (BMI) of 40.0 to 44.9 in adult [facility]) 04/13/2019  ? Depressive disorder 04/13/2019  ? Body mass index (BMI) 45.0-49.9, adult ([name]) 04/13/2019  ? Kidney disease, chronic, stage III (GFR 30-59 ml/min) ([place]) 07/20/2017  ? History of cerebrovascular accident 07/20/2017  ? Gastroesophageal reflux disease 07/20/2017  ? Gastric bypass status for obesity 02/11/2017  ? Benzodiazepine dependence ([name]) 02/11/2017  ? Type 2 diabetes mellitus with diabetic nephropathy ([name]) 08/03/2008  ? Hyperlipidemia 08/03/2008  ? SLEEP APNEA 08/03/2008  ? Sleep apnea 08/03/2008  ? ? ?Care Plan : RN Care Manager Plan Of Care  ?Updates made by [name], RN since 08/12/2021 12:00 AM  ?  ? ?Problem: Chronic Disease Management and Care Coordination Needs for DM, HTN, HLD, CKD   ?  ? ?Long-Range Goal: Development of Plan Of Care For Chronic Disease Management and Care Coordination Needs to Assist With Meeting Treatment Goals for DM, CKD, HTN, obesity, chronic hip pain   ?Start Date: 01/07/2021  ?Expected End Date: 01/07/2022  ?Priority: High  ?Note:   ?Current Barriers:  ?Knowledge Deficits related to plan of care for management of HTN, HLD, CKD, and DMII, chronic pain, obesity- patient states he continues to receive steroid shots in hips alternating  each month and requires oxycodone to manage  pain, says his next injection is scheduled for early April, says his orthopedic surgeon has again decreased his goal weight to 255 lbs or less in order for him to have bilateral hip replacement surgery, says the Ozempic is helping him lose weight and have less desire for sweets, states he weighs weekly on Wednesday and his last home weight was 264 lbs, says he saw his primary care provider on 07/1021 and was given pre-op clearance, says he will see her again in May  ?Chronic Disease Management support and education needs related to HTN, HLD, and DMII, chronic pain ? ?RNCM Clinical Goal(s):  ?Patient will verbalize understanding of plan for management of HTN, HLD, CKD and DMII ?take all medications exactly as prescribed and will call provider for medication related questions ?attend all scheduled medical appointments:  ?demonstrate ongoing adherence to prescribed treatment plan for HTN, HLD, CKD and DMII as evidenced by daily/ twice  daily monitoring and recording of CBG,  adherence to ADA/ carb modified, fat modified, no added salt diet, continue 30-40 minutes of exercise on your NuStep 5-7 days/week, adherence to prescribed medication regimen including new diabetes medication,  contacting provider for new or worsened symptoms or questions related to HTN, DM or HLD, CKD ?continue to work with Consulting civil engineer and managed Medicaid pharmacist to address care management and care coordination needs related to HTN, HLD, and DMII , CKD ? ?Interventions: ?Inter-disciplinary care team collaboration (see longitudinal plan of care) ?Evaluation of current treatment plan related to  self management and patient's adherence to plan as established by provider ?Reminded patient of follow up telephone appointment with managed Medicaid pharmacist on 08/19/21 ? ? ?Chronic Kidney Disease (Status: Goal on Track (progressing): YES.) - all recent provider BP readings are meeting treatment targets, patient reports his nephrologist was pleased  that his creatinine has stabilized and that his BP reading during office visit on 06/05/21 was "good" , chart review shows BP= 130/70,  patient states he checks his BP occasionally and that readings are usual

## 2021-08-12 NOTE — Patient Instructions (Signed)
Visit Information ? ?Mr. Cerone was given information about Medicaid Managed Care team care coordination services as a part of their Healthy [facility] Medicaid benefit. [name] verbally consented to engagement with the [facility] Managed Care team.  ? ?If you are experiencing a medical emergency, please call 911 or report to your local emergency department or urgent care.  ? ?If you have a non-emergency medical problem during routine business hours, please contact your provider's office and ask to speak with a nurse.  ? ?For questions related to your Healthy [facility] health plan, please call: [phone] or visit the homepage here: [email or web address] ? ?If you would like to schedule transportation through your Healthy [facility] plan, please call the following number at least 2 days in advance of your appointment: [phone] ? For information about your ride after you set it up, call Ride Assist at [phone]. Use this number to activate a Will Call pickup, or if your transportation is late for a scheduled pickup. Use this number, too, if you need to make a change or cancel a previously scheduled reservation. ? If you need transportation services right away, call [phone]. The after-hours call center is staffed 24 hours to handle ride assistance and urgent reservation requests (including discharges) 365 days a year. Urgent trips include sick visits, hospital discharge requests and life-sustaining treatment. ? ?Call the [place] at [phone], at any time, 24 hours a day, 7 days a week. If you are in danger or need immediate medical attention call 911. ? ?If you would like help to quit smoking, call 1-800-QUIT-NOW 647-613-2283) OR Espa?ol: 1-855-D?jelo-Ya 718-698-8332) o para m?s informaci?n haga clic aqu? or Text READY to 200-400 to register via text ? ?Mr. Obey - following are the goals we discussed in your visit today:  ? Goals  Addressed   ? ? ? ? ?Patient verbalizes understanding of instructions and care plan provided today and agrees to view in [place]. Active MyChart status confirmed with patient.   ? ?The Managed Medicaid care management team will reach out to the patient again over the next 30 days.  ? ?Kelli Churn RN, CCM, CDCES ?[facility] ?Care Management Coordinator - Managed Medicaid High Risk ?[phone]  ? ?Following is a copy of your plan of care:  ?Care Plan : [place]  ?Updates made by [name], RN since 08/12/2021 12:00 AM  ?  ? ?Problem: Chronic Disease Management and Care Coordination Needs for DM, HTN, HLD, CKD   ?  ? ?Long-Range Goal: Development of Plan Of Care For Chronic Disease Management and Care Coordination Needs to Assist With Meeting Treatment Goals for DM, CKD, HTN, obesity, chronic hip pain   ?Start Date: 01/07/2021  ?Expected End Date: 01/07/2022  ?Priority: High  ?Note:   ?Current Barriers:  ?Knowledge Deficits related to plan of care for management of HTN, HLD, CKD, and DMII, chronic pain, obesity- patient states he continues to receive steroid shots in hips alternating  each month and requires oxycodone to manage pain, says his next injection is scheduled for early April, says his orthopedic surgeon has again decreased his goal weight to 255 lbs or less in order for him to have bilateral hip replacement surgery, says the Ozempic is helping him lose weight and have less desire for sweets, states he weighs weekly on Wednesday and his last home weight was 264 lbs, says he saw his primary care provider on 07/1021 and was given  pre-op clearance, says he will see her again in May  ?Chronic Disease Management support and education needs related to HTN, HLD, and DMII, chronic pain ? ?RNCM Clinical Goal(s):  ?Patient will verbalize understanding of plan for management of HTN, HLD, CKD and DMII ?take all medications exactly as prescribed and will call provider  for medication related questions ?attend all scheduled medical appointments:  ?demonstrate ongoing adherence to prescribed treatment plan for HTN, HLD, CKD and DMII as evidenced by daily/ twice daily monitoring and recording of CBG,  adherence to ADA/ carb modified, fat modified, no added salt diet, continue 30-40 minutes of exercise on your NuStep 5-7 days/week, adherence to prescribed medication regimen including new diabetes medication,  contacting provider for new or worsened symptoms or questions related to HTN, DM or HLD, CKD ?continue to work with Consulting civil engineer and managed Medicaid pharmacist to address care management and care coordination needs related to HTN, HLD, and DMII , CKD ? ?Interventions: ?Inter-disciplinary care team collaboration (see longitudinal plan of care) ?Evaluation of current treatment plan related to  self management and patient's adherence to plan as established by provider ?Reminded patient of follow up telephone appointment with managed Medicaid pharmacist on 08/19/21 ? ? ?Chronic Kidney Disease (Status: Goal on Track (progressing): YES.) - all recent provider BP readings are meeting treatment targets, patient reports his nephrologist was pleased that his creatinine has stabilized and that his BP reading during office visit on 06/05/21 was "good" , chart review shows BP= 130/70,  patient states he checks his BP occasionally and that readings are usually <130/<80, he says provider office readings are higher at times because he is in pain from the walk to the offices. 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, he will see his nephrologist again on 09/03/21 ?Last practice recorded BP readings:  ?  ?BP Readings from Last 3 Encounters:  ?08/01/21 107/76  ?06/26/21 110/76  ?06/19/21 116/76  ?      ?  ?Most recent eGFR/CrCl:  ?Lab Results  ?Component Value Date  ? NA 137 05/15/2021  ? K 4.1 05/15/2021  ? CREATININE 1.63 (H) 05/15/2021  ?  EGFR 51 (L) 05/15/2021  ? GFRNONAA 37 (L) 01/10/2020  ? GLUCOSE 128 (H) 05/15/2021  ?  ? ?Reviewed medications with patient and discussed importance of compliance   ?Assessed frequency of self monitored BP, reviewed home and provider office readings, reviewed treatment targets and reviewed lifestyle strategies      ?Reviewed scheduled/upcoming provider appointments including:  for MNT with [name] on 08/19/21, with primary care follow up on  09/18/21 and with cardiologist on 09/26/21 ?Discussed plans with patient for ongoing care management follow up and provided patient with direct contact information for care management team    ? ? ?Diabetes:  (Status: Goal on track: YES.)- patient reports self monitored fasting CBGs are usually <130, per primary care provider note of 3/10, she will reassess his Hgb A1C in 2 months ?Lab Results  ?Component Value Date  ? HGBA1C 7.0 (H) 05/15/2021  ?Assessed frequency of CBG testing and reviewed fasting target ?Reviewed medications with patient and discussed importance of medication adherence- ensured patient has increased Ozempic to 2 mg weekly ?Reminded patient of follow up appointment with RD, CDCES on 08/19/21 for MNT and weight management- ensured patient has transportation ?Discussed plans with patient for ongoing care management follow up and provided patient with direct contact information for care management team;              ? ?  Health Maintenance (Status: Condition stable. Not addressed this visit.) - patient completed colonoscopy and endoscopy on 02/26/21 and was aware of pathology report- non cancerous ?Patient interviewed about adult health maintenance status   ?Pneumonia Vaccine-  received pneumococcal  vaccine on 05/15/21 ?Influenza Vaccine- states he received flu shot on 02/06/21 ?COVID vaccination   - pt states he never received , will message provider prior to his appointment in May ?Shingles Vaccination- received first dose on 02/06/21, did not receive 2nd  vaccination so will message provider prior to his appointment in May ?Regular eye checkups- says he sees his eye doctor, Dr [name],  every 6 months due to glaucoma and his next appointment is in November  ?[place]

## 2021-08-13 DIAGNOSIS — M160 Bilateral primary osteoarthritis of hip: Secondary | ICD-10-CM | POA: Diagnosis not present

## 2021-08-14 ENCOUNTER — Ambulatory Visit: Payer: Medicaid Other | Admitting: Nurse Practitioner

## 2021-08-19 ENCOUNTER — Ambulatory Visit: Payer: Medicaid Other | Admitting: Nutrition

## 2021-08-19 ENCOUNTER — Other Ambulatory Visit: Payer: Self-pay | Admitting: Pharmacist

## 2021-08-19 NOTE — Patient Instructions (Signed)
It was great to speak with you today! ? ?I recommend we increase your atorvastatin to 80 mg daily. With your history of a stroke, I would recommend a goal LDL (bad cholesterol) less than 70.  ? ?I recommend we add Jardiance to help with your blood sugars AND your kidneys.  ? ?Check your blood sugars twice daily:  ?1) Fasting, first thing in the morning before breakfast and  ?2) 2 hours after your largest meal.  ? ?For a goal A1c of less than 7%, goal fasting readings are less than 130 and goal 2 hour after meal readings are less than 180.  ? ?Check your blood pressure weekly . Our goal is less than 130/80 ? ?We recommend a blood pressure cuff that goes around your upper arm, as these are generally the most accurate.  ? ?To appropriately check your blood pressure, make sure you do the following:  ?1) Avoid caffeine, exercise, or tobacco products for 30 minutes before checking. ?2) Sit with your back supported in a flat-backed chair. Rest your arm on something flat (arm of the chair, table, etc). ?3) Sit still with your feet flat on the floor, resting, for at least 5 minutes.  ?4) Check your blood pressure. Take 1-2 readings.  ? ?Write down these readings and bring with you to any provider appointments. Bring your home blood pressure machine with you to a provider's office for accuracy comparison at least once a year. ? ? ?Take care! ? ?Catie [name], PharmD, BCACP ?[place] ?[phone] ? ? ?Visit Information ? ?Mr. Nance was given information about Medicaid Managed Care team care coordination services as a part of their Healthy [facility] Medicaid benefit. [name] verbally consented to engagement with the [facility] Managed Care team.  ? ?If you are experiencing a medical emergency, please call 911 or report to your local emergency department or urgent care.  ? ?If you have a non-emergency medical problem during routine business hours, please contact your provider's office and ask to speak with a  nurse.  ? ?For questions related to your Healthy [facility] health plan, please call: [phone] or visit the homepage here: [email or web address] ? ?If you would like to schedule transportation through your Healthy [facility] plan, please call the following number at least 2 days in advance of your appointment: [phone] ? For information about your ride after you set it up, call Ride Assist at [phone]. Use this number to activate a Will Call pickup, or if your transportation is late for a scheduled pickup. Use this number, too, if you need to make a change or cancel a previously scheduled reservation. ? If you need transportation services right away, call [phone]. The after-hours call center is staffed 24 hours to handle ride assistance and urgent reservation requests (including discharges) 365 days a year. Urgent trips include sick visits, hospital discharge requests and life-sustaining treatment. ? ?  ?

## 2021-08-19 NOTE — Patient Outreach (Signed)
? ?   ? ?Chief Complaint  ?Patient presents with  ? High Risk Managed Medicaid  ? ? ?[name] is a 51 y.o. year old male who was referred for medication management by their primary care provider, [name], FNP. They presented for a telephone visit in the context of the COVID-19 pandemic. ?  ?They were referred to the pharmacist by their High Risk Managed Medicaid Care Team  for assistance in managing diabetes and hyperlipidemia.  ? ?Subjective: ?Diabetes: ? ?Current medications:  Ozempic 2 mg weekly ?Medications tried in the past: metformin, previously discontinued due to renal dysfunction, though eGFR is appropriate for reinitiation if needed in the future ? ?Current glucose readings: fasting: 130-140s; 2 hour post prandial: 160s ?Using Accu Chek meter; testing twice times daily ? ?Hypertension in the setting of CKD ? ?Current medications: losartan 25 mg daily ? ?Current blood pressure readings readings: not checking, but does have a home meter ? ?Hyperlipidemia/ASCVD Risk Reduction ? ?Current lipid lowering medications: atorvastatin 40 mg daily ? ?Antiplatelet regimen: none - will discuss moving forward  ? ?ASCVD History: hx CVA  ?  ? ?Objective: ?Lab Results  ?Component Value Date  ? HGBA1C 7.0 (H) 05/15/2021  ? ? ?Lab Results  ?Component Value Date  ? CREATININE 1.63 (H) 05/15/2021  ? BUN 25 (H) 05/15/2021  ? NA 137 05/15/2021  ? K 4.1 05/15/2021  ? CL 97 05/15/2021  ? CO2 26 05/15/2021  ? ? ?Lab Results  ?Component Value Date  ? CHOL 162 05/15/2021  ? HDL 48 05/15/2021  ? [place] 88 05/15/2021  ? TRIG 147 05/15/2021  ? CHOLHDL 6.1 (H) 12/20/2019  ? ? ?Medications Reviewed Today   ? ? Reviewed by De Hollingshead, RPH-CPP (Pharmacist) on 08/19/21 at 0910  Med List Status: <None>  ? ?Medication Order Taking? Sig Documenting Provider Last Dose Status Informant  ?atorvastatin (LIPITOR) 40 MG tablet [id number] Yes Take 1 tablet (40 mg total) by mouth daily. [name], NP Taking Active   ?          ?Med Note [name], Rubens Cranston E   Tue Aug 19, 2021  9:00 AM)    ?blood glucose meter kit and supplies KIT [id number] Yes Test daily ?Dx  r73.03 [name], DO Taking Active Self  ?Blood Pressure Monitor KIT [id number] Yes 1 each by Does not apply route daily. [name], NP Taking Active   ?cyclobenzaprine (FLEXERIL) 10 MG tablet [id number]  TAKE 1 TABLET BY MOUTH 2 TIMES A DAY AS NEEDEDFOR MUSCLE SPASMS. [name], MD  Active   ?cyclobenzaprine (FLEXERIL) 10 MG tablet [id number]  TAKE 1 TABLET BY MOUTH 2 TIMES A DAY AS NEEDED FOR MUSCLE SPASMS. [name], FNP  Active   ?gabapentin (NEURONTIN) 300 MG capsule [id number] Yes Take 300 mg by mouth at bedtime. [provider] Taking Active   ?glucose blood test strip [id number] Yes Use as instructed. Can check up to 4 times daily. [name], NP Taking Active   ?latanoprost (XALATAN) 0.005 % ophthalmic solution [id number] Yes Place 1 drop into both eyes at bedtime. [provider] Taking Active Self  ?losartan (COZAAR) 25 MG tablet [id number] Yes Take 1 tablet (25 mg total) by mouth daily. [name], MD Taking Active   ?NARCAN 4 MG/0.1ML LIQD nasal spray kit [id number]  Place 0.4 mg into the nose once. [provider]  Active Self  ?         ?Med  Note De Hollingshead   Tue Aug 19, 2021  9:06 AM)    ?Oxycodone HCl 20 MG TABS [id number] Yes Take 20 mg by mouth every 6 (six) hours as needed (pain). [provider] Taking Active Self  ?         ?Med Note [name]   Tue Jan 07, 2021 10:36 AM) [name] prn  ?Semaglutide, 2 MG/DOSE, 8 MG/3ML SOPN [id number] Yes Inject 2 mg as directed once a week. [name], NP Taking Active   ?sildenafil (VIAGRA) 50 MG tablet [id number]  Take 1 tablet (50 mg total) by mouth daily as needed for erectile dysfunction. [name], MD  Active   ?timolol (BETIMOL) 0.5 % ophthalmic solution [id number] Yes Place 1 drop into both eyes in the morning. [provider] Taking Active Self  ? ?  ?  ? ?  ? ? ?Assessment/Plan:  ?Care Plan : Medication Management  ?Updates made by De Hollingshead, RPH-CPP since 08/19/2021 12:00 AM  ?  ? ?Problem: Diabetes   ?  ? ?Long-Range Goal: A1c <7%   ?Note:   ?Current Barriers:  ?Suboptimal therapeutic regimen for diabetes ? ?Patient Needs: ?Therapy optimization ? ?Patient Activities: ?Patient will:  ?- take medications as prescribed as evidenced by patient report and record review ?check glucose twice daily, document, and provide at future appointments ? ?  ? ?Problem: Hyperlipidemia and ASCVD   ?  ? ?Long-Range Goal: LDL <70   ?Note:   ?Current Barriers:  ?Suboptimal therapeutic regimen for lipid management ? ?Patient Needs: ?Therapy optimization ? ?Patient Activities: ?Patient will:  ?- take medications as prescribed as evidenced by patient report and record review ? ?  ? ? ?Diabetes: ?- Currently uncontrolled ?- Reviewed goal A1c, goal fasting, and goal 2 hour post prandial glucose ?- Recommend initiation of SGLT2 given DM and CKD. Patient amenable. Will collaborate w/ PCP. ?- Recommend to check glucose twice daily ? ?Hypertension: ?- Currently controlled ?- Reviewed appropriate blood pressure monitoring technique and reviewed goal blood pressure. Recommended to check home blood pressure and heart rate once weekly ?- Recommend to continue current regimen ? ?Hyperlipidemia/ASCVD Risk Reduction: ?- Currently uncontrolled.  ?- Reviewed long term complications of uncontrolled cholesterol ?- Recommend to increase atorvastatin to 80 mg daily to target LDL <70. Will collaborate w/ PCP ? ?Follow Up Plan: follow up in 6 weeks ? ?Catie [name], PharmD, BCACP ?[place] ?[phone] ? ? ? ?

## 2021-08-21 ENCOUNTER — Ambulatory Visit: Payer: Medicaid Other | Admitting: Nutrition

## 2021-08-21 ENCOUNTER — Ambulatory Visit: Payer: Medicaid Other | Admitting: Cardiology

## 2021-08-28 DIAGNOSIS — E1122 Type 2 diabetes mellitus with diabetic chronic kidney disease: Secondary | ICD-10-CM | POA: Diagnosis not present

## 2021-08-28 DIAGNOSIS — E1129 Type 2 diabetes mellitus with other diabetic kidney complication: Secondary | ICD-10-CM | POA: Diagnosis not present

## 2021-08-28 DIAGNOSIS — N189 Chronic kidney disease, unspecified: Secondary | ICD-10-CM | POA: Diagnosis not present

## 2021-08-28 DIAGNOSIS — I5032 Chronic diastolic (congestive) heart failure: Secondary | ICD-10-CM | POA: Diagnosis not present

## 2021-08-28 DIAGNOSIS — I129 Hypertensive chronic kidney disease with stage 1 through stage 4 chronic kidney disease, or unspecified chronic kidney disease: Secondary | ICD-10-CM | POA: Diagnosis not present

## 2021-08-28 DIAGNOSIS — R809 Proteinuria, unspecified: Secondary | ICD-10-CM | POA: Diagnosis not present

## 2021-09-01 ENCOUNTER — Other Ambulatory Visit: Payer: Self-pay | Admitting: Nurse Practitioner

## 2021-09-01 ENCOUNTER — Other Ambulatory Visit: Payer: Self-pay | Admitting: Internal Medicine

## 2021-09-01 DIAGNOSIS — N529 Male erectile dysfunction, unspecified: Secondary | ICD-10-CM

## 2021-09-01 MED ORDER — SILDENAFIL CITRATE 50 MG PO TABS
50.0000 mg | ORAL_TABLET | Freq: Every day | ORAL | 0 refills | Status: DC | PRN
Start: 2021-09-01 — End: 2022-01-19

## 2021-09-02 NOTE — Telephone Encounter (Signed)
Spoke with pt advised to contact pain management. Pt verbalized understanding saying he thought he sent request to them instead of [name].  ?

## 2021-09-03 DIAGNOSIS — E1129 Type 2 diabetes mellitus with other diabetic kidney complication: Secondary | ICD-10-CM | POA: Diagnosis not present

## 2021-09-03 DIAGNOSIS — N189 Chronic kidney disease, unspecified: Secondary | ICD-10-CM | POA: Diagnosis not present

## 2021-09-03 DIAGNOSIS — E1122 Type 2 diabetes mellitus with diabetic chronic kidney disease: Secondary | ICD-10-CM | POA: Diagnosis not present

## 2021-09-03 DIAGNOSIS — R809 Proteinuria, unspecified: Secondary | ICD-10-CM | POA: Diagnosis not present

## 2021-09-03 DIAGNOSIS — I5032 Chronic diastolic (congestive) heart failure: Secondary | ICD-10-CM | POA: Diagnosis not present

## 2021-09-03 DIAGNOSIS — I129 Hypertensive chronic kidney disease with stage 1 through stage 4 chronic kidney disease, or unspecified chronic kidney disease: Secondary | ICD-10-CM | POA: Diagnosis not present

## 2021-09-09 ENCOUNTER — Telehealth: Payer: Self-pay

## 2021-09-09 DIAGNOSIS — G8929 Other chronic pain: Secondary | ICD-10-CM | POA: Diagnosis not present

## 2021-09-09 DIAGNOSIS — M5136 Other intervertebral disc degeneration, lumbar region: Secondary | ICD-10-CM | POA: Diagnosis not present

## 2021-09-09 DIAGNOSIS — G894 Chronic pain syndrome: Secondary | ICD-10-CM | POA: Diagnosis not present

## 2021-09-09 DIAGNOSIS — M545 Low back pain, unspecified: Secondary | ICD-10-CM | POA: Diagnosis not present

## 2021-09-09 NOTE — Telephone Encounter (Signed)
Pt called and said Dr [name] with [facility] has asked [name] to schedule a Sleep Apnea exam for him.  Can you order this.  ?

## 2021-09-09 NOTE — Telephone Encounter (Signed)
Please advise 

## 2021-09-10 NOTE — Telephone Encounter (Signed)
Called pt he states that he had a cpap machine back in 2007/2008 but he couldn't sleep with it at the time. Currently he can't lay in bed due to hips can't sleep on back doesn't know if he snores or not. Please advise ?

## 2021-09-10 NOTE — Telephone Encounter (Signed)
Scheduled for tomorrow.

## 2021-09-11 ENCOUNTER — Ambulatory Visit: Payer: Medicaid Other | Admitting: Nurse Practitioner

## 2021-09-11 ENCOUNTER — Encounter: Payer: Self-pay | Admitting: Nurse Practitioner

## 2021-09-11 ENCOUNTER — Ambulatory Visit (INDEPENDENT_AMBULATORY_CARE_PROVIDER_SITE_OTHER): Payer: Medicaid Other | Admitting: Nurse Practitioner

## 2021-09-11 DIAGNOSIS — G4730 Sleep apnea, unspecified: Secondary | ICD-10-CM

## 2021-09-11 DIAGNOSIS — G4733 Obstructive sleep apnea (adult) (pediatric): Secondary | ICD-10-CM | POA: Insufficient documentation

## 2021-09-11 NOTE — Progress Notes (Signed)
Virtual Visit via Telephone Note ? ?I connected with [name] on 09/11/21 at 4:00pmby telephone and verified that I am speaking with the correct person using two identifiers.  I spent 6 minutes on this telephone encounter.  ? ?Location: ?Patient: home ?Provider: office ?  ?I discussed the limitations, risks, security and privacy concerns of performing an evaluation and management service by telephone and the availability of in person appointments. I also discussed with the patient that there may be a patient responsible charge related to this service. The patient expressed understanding and agreed to proceed. ? ? ?History of Present Illness: ?[name] with past medical history of sleep apnea, GERD, type 2 diabetes mellitus, bilateral primary osteoarthritis of hip states that he would like to have a sleep study done , his pain doctor asked him to do this done, has history of sleep apnea he has used CPAP in the past but he stopped using it. Sometimes feel well rested after sleeping, gets about 6 hours of sleep daily, does not know if he snores or quits breathing in his sleep.  He feels like he needs to start using his CPAP again.  ?  ?Observations/Objective: ? ? ?Assessment and Plan: ?Sleep apnea ?Was previously on CPAP ? will like to have a retest for sleep apnea since he has not used his machine in many years ?States that his pain management doctor would like for him  to have this test done  ? ?Follow Up Instructions: ? ?  ?I discussed the assessment and treatment plan with the patient. The patient was provided an opportunity to ask questions and all were answered. The patient agreed with the plan and demonstrated an understanding of the instructions. ?  ?The patient was advised to call back or seek an in-person evaluation if the symptoms worsen or if the condition fails to improve as anticipated.  ?

## 2021-09-11 NOTE — Assessment & Plan Note (Addendum)
Was previously on CPAP ? will like to have a retest for sleep apnea since he has not used his machine in many years ?States that his pain management doctor would like for him  to have this test done ?Home sleep study ordered. ?

## 2021-09-12 ENCOUNTER — Ambulatory Visit: Payer: Self-pay

## 2021-09-12 ENCOUNTER — Telehealth: Payer: Self-pay | Admitting: *Deleted

## 2021-09-12 ENCOUNTER — Other Ambulatory Visit: Payer: Self-pay

## 2021-09-12 DIAGNOSIS — G4730 Sleep apnea, unspecified: Secondary | ICD-10-CM

## 2021-09-12 NOTE — Patient Instructions (Signed)
[name] ,  ? ?The [facility] Managed Care Team is available to provide assistance to you with your healthcare needs at no cost and as a benefit of your [facility] Health plan. I'm sorry I was unable to reach you today for our scheduled appointment. Our care guide will call you to reschedule our telephone appointment. Please call me at the number below. I am available to be of assistance to you regarding your healthcare needs. Marland Kitchen  ?Please call your RNCM Kelli Churn at [phone] if you prefer to be called on Monday or Tuesday of next week, April 24 or 25. ? ?Thank you,  ? ?Kelli Churn RN, CCM, CDCES ?[facility] ?Care Management Coordinator - Managed Medicaid High Risk ?[phone]   ?

## 2021-09-12 NOTE — Patient Outreach (Signed)
Care Coordination ? ?09/12/2021 ? ?BRAND SIEVER ?09-22-1970 ?[id number] ? ?[name] ,  ? ?The [facility] Managed Care Team is available to provide assistance to you with your healthcare needs at no cost and as a benefit of your [facility] Health plan. I'm sorry I was unable to reach you today for our scheduled appointment. Our care guide will call you to reschedule our telephone appointment. Please call me at the number below. I am available to be of assistance to you regarding your healthcare needs. Marland Kitchen  ?Since this RNCM only works 1/2 day on Friday's I asked Mr. [place] on the voice mail to call back if her has a preferred day of Monday or Tuesday of next week.  ? ?Thank you,  ? ?Kelli Churn RN, CCM, CDCES ?[facility] ?Care Management Coordinator - Managed Medicaid High Risk ?[phone]   ?

## 2021-09-15 ENCOUNTER — Other Ambulatory Visit: Payer: Medicaid Other | Admitting: *Deleted

## 2021-09-15 NOTE — Patient Instructions (Signed)
Visit Information ? ?[name] was given information about Medicaid Managed Care team care coordination services as a part of their Healthy [facility] Medicaid benefit. [name] verbally consented to engagement with the [facility] Managed Care team.  ? ?If you are experiencing a medical emergency, please call 911 or report to your local emergency department or urgent care.  ? ?If you have a non-emergency medical problem during routine business hours, please contact your provider's office and ask to speak with a nurse.  ? ?For questions related to your Healthy [facility] health plan, please call: [phone] or visit the homepage here: [email or web address] ? ?If you would like to schedule transportation through your Healthy [facility] plan, please call the following number at least 2 days in advance of your appointment: [phone] ? For information about your ride after you set it up, call Ride Assist at [phone]. Use this number to activate a Will Call pickup, or if your transportation is late for a scheduled pickup. Use this number, too, if you need to make a change or cancel a previously scheduled reservation. ? If you need transportation services right away, call [phone]. The after-hours call center is staffed 24 hours to handle ride assistance and urgent reservation requests (including discharges) 365 days a year. Urgent trips include sick visits, hospital discharge requests and life-sustaining treatment. ? ?Call the [facility] at [phone], at any time, 24 hours a day, 7 days a week. If you are in danger or need immediate medical attention call 911. ? ?If you would like help to quit smoking, call 1-800-QUIT-NOW 340-195-7491) OR Espa?ol: 1-855-D?jelo-Ya 806-377-3854) o para m?s informaci?n haga clic aqu? or Text READY to 200-400 to register via text ? ?Mr. Schlafer - following are the goals we discussed in your visit today:  ? Goals  Addressed   ?ake medications as prescribed   ?Attend all scheduled provider appointments ?Call pharmacy for medication refills 3-7 days in advance of running out of medications ?Perform all self care activities independently  ?Perform IADL's (shopping, preparing meals, housekeeping, managing finances) independently ?Call provider office for new concerns or questions  ?Call and schedule dentist appointment with Dr. Mina [name] for check up and cleaning as soon as possible , phone number [phone] ?Reschedule a follow up appointment with [name], dietician and CDCES, to help with ongoing weight loss ? ? ? ?Patient verbalizes understanding of instructions and care plan provided today and agrees to view in [place]. Active MyChart status confirmed with patient.   ? ?The Managed Medicaid care management team will reach out to the patient again over the next 30-45 days.  ? ?Kelli Churn RN, CCM, CDCES ?[facility] ?Care Management Coordinator - Managed Medicaid High Risk ?[phone]  ? ?Following is a copy of your plan of care:  ?Care Plan : [place]  ?Updates made by [name], RN since 09/15/2021 12:00 AM  ?  ? ?Problem: Chronic Disease Management and Care Coordination Needs for DM, HTN, HLD, CKD   ?  ? ?Long-Range Goal: Development of Plan Of Care For Chronic Disease Management and Care Coordination Needs to Assist With Meeting Treatment Goals for DM, CKD, HTN, obesity, chronic hip pain   ?Start Date: 01/07/2021  ?Expected End Date: 01/07/2022  ?Priority: High  ?Note:   ?Current Barriers:  ?Knowledge Deficits related to plan of care for management of HTN, HLD, CKD, and DMII, chronic pain, obesity- patient states he continues to receive steroid  shots in hips alternating each month and requires oxycodone to manage pain, says he will call to schedule his next injection in his right hip, reports insurance has approved bilateral hip replacement surgery and he is doing  his best to get his weight to 255 lbs per requirement from his orthopedic surgeon, says the Ozempic is helping him lose weight and have less desire for sweets, states he weighs weekly on Wednesday and his last home weight was 260 lbs, says he participated in a telehealth visit with his primary care provider on 09/11/21 to discuss sleep study per the request of his new pain management MD, Dr. [name] ?Chronic Disease Management support and education needs related to HTN, HLD, and DMII, chronic pain ? ?RNCM Clinical Goal(s):  ?Patient will verbalize understanding of plan for management of HTN, HLD, CKD and DMII ?take all medications exactly as prescribed and will call provider for medication related questions ?attend all scheduled medical appointments:  ?demonstrate ongoing adherence to prescribed treatment plan for HTN, HLD, CKD and DMII as evidenced by daily/ twice daily monitoring and recording of CBG,  adherence to ADA/ carb modified, fat modified, no added salt diet, continue 30-40 minutes of exercise on your NuStep 5-7 days/week, adherence to prescribed medication regimen including new diabetes medication,  contacting provider for new or worsened symptoms or questions related to HTN, DM or HLD, CKD ?continue to work with Consulting civil engineer and managed Medicaid pharmacist to address care management and care coordination needs related to HTN, HLD, and DMII , CKD ? ?Interventions: ?Inter-disciplinary care team collaboration (see longitudinal plan of care) ?Evaluation of current treatment plan related to  self management and patient's adherence to plan as established by provider ?Advise patient role of RNCM will transition to [name] and follow up telephone appointment scheduled with patient for 10/21/21 at 1:00 pm ?Reminded patient of follow up telephone appointment with managed Medicaid pharmacist on 10/21/21 at 9:00 am ?Reviewed appointments with cardiology on 5/5 at 2:40 pm, with primary care provider on 5/11 at  10:00 am and with nephrologist on 11/13/21; ensured patient has transportation to all appointments  ? ? ?Chronic Kidney Disease (Status: Goal on Track (progressing): YES.) - all recent provider BP readings are meeting treatment targets, patient reports his nephrologist was pleased that his creatinine has stabilized and that his BP reading during office visit on 09/03/21 was "good" , chart review shows BP= 130/70,  patient states he checks his BP occasionally and that readings are usually <130/<80, he says provider office readings are higher at times because he is in pain from the walk to the offices. 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, and added Farxiga 5 mg to patient's medication regimen on 09/03/21, he will see his nephrologist again on 11/13/21 ?Last practice recorded BP readings:  ?  ?BP Readings from Last 3 Encounters:  ?08/01/21 107/76  ?06/26/21 110/76  ?06/19/21 116/76  ?       ?  ?Most recent eGFR/CrCl:  ?Lab Results  ?Component Value Date  ? NA 137 05/15/2021  ? K 4.1 05/15/2021  ? CREATININE 1.63 (H) 05/15/2021  ? EGFR 51 (L) 05/15/2021  ? GFRNONAA 37 (L) 01/10/2020  ? GLUCOSE 128 (H) 05/15/2021  ?  ? ?Reviewed medications with patient and discussed importance of compliance   ?Assessed frequency of self monitored BP, reviewed home and provider office readings, reviewed treatment targets and reviewed lifestyle strategies   ?Reviewed addition of Farxiga to medication  regimen for blood pressure, blood sugar and weight control in addition to slowing progression of CKD and lowering risks of heart attack, stroke and cardiac death , assessed tolerance of Farxiga and reviewed major adverse side effects   ?Reviewed scheduled/upcoming provider appointments including:  with cardiologist on 09/26/21. With primary care provider on 5/11, and telephone call with managed Medicaid pharmacist on 5/30 ?Discussed plans with patient for ongoing care management follow up  and provided patient with direct contact information for care management team    ? ? ?Diabetes:  (Status: Goal on track: YES.)- patient reports self monitored fasting CBGs are usually <130, per primary car

## 2021-09-15 NOTE — Patient Outreach (Signed)
?Medicaid Managed Care   ?Nurse Care Manager Note ? ?09/15/2021 ?Name:  [name] MRN:  [id number] DOB:  1970/06/30 ? ?[name] is an 51 y.o. year old male who is a primary [name] of [name] Rival, FNP.  The [facility] Managed Care Coordination team was consulted for assistance with:    ?HTN ?HLD ?DM ?CKD Stage 3 ?Obesity ? ? ?[name] was given information about Medicaid Managed Care Coordination team services today. [name] agreed to services and verbal consent obtained. ? ?Engaged with [name] by telephone for follow up visit in response to provider referral for case management and/or care coordination services.  ? ?Assessments/Interventions:  Review of past medical history, allergies, medications, health status, including review of consultants reports, laboratory and other test data, was performed as part of comprehensive evaluation and provision of chronic care management services. ? ?SDOH (Social Determinants of Health) assessments and interventions performed: ? ? ?Care Plan ? ?No Known Allergies ? ?Medications Reviewed Today   ? ? Reviewed by [name] Ellison, RN (Registered Nurse) on 09/15/21 at 1344  Med List Status: <None>  ? ?Medication Order Taking? Sig Documenting Provider Last Dose Status Informant  ?atorvastatin (LIPITOR) 40 MG tablet [id number] No Take 1 tablet (40 mg total) by mouth daily. [name], NP Taking Active   ?         ?Med Note [name], [name]   Tue Aug 19, 2021  9:00 AM)    ?blood glucose meter kit and supplies KIT [id number] No Test daily ?Dx  r73.03 [name], DO Taking Active Self  ?Blood Pressure Monitor KIT [id number] No 1 each by Does not apply route daily. [name], NP Taking Active   ?cyclobenzaprine (FLEXERIL) 10 MG tablet [id number] No TAKE 1 TABLET BY MOUTH 2 TIMES A DAY AS NEEDEDFOR MUSCLE SPASMS. [name], MD Taking Active   ?cyclobenzaprine (FLEXERIL) 10 MG tablet [id number] No TAKE 1 TABLET BY MOUTH 2 TIMES A DAY AS  NEEDED FOR MUSCLE SPASMS. [name] Rival, FNP Taking Active   ?FARXIGA 5 MG TABS tablet [id number] No Take 5 mg by mouth every morning. [provider] Taking Active   ?gabapentin (NEURONTIN) 300 MG capsule [id number] No Take 300 mg by mouth at bedtime. [provider] Taking Active   ?glucose blood test strip [id number] No Use as instructed. Can check up to 4 times daily. [name], NP Taking Active   ?latanoprost (XALATAN) 0.005 % ophthalmic solution [id number] No Place 1 drop into both eyes at bedtime. [provider] Taking Active Self  ?losartan (COZAAR) 25 MG tablet [id number] No Take 1 tablet (25 mg total) by mouth daily. [name], MD Taking Active   ?NARCAN 4 MG/0.1ML LIQD nasal spray kit [id number] No Place 0.4 mg into the nose once. [provider] Taking Active Self  ?         ?Med Note ([place] Aug 19, 2021  9:06 AM)    ?Oxycodone HCl 20 MG TABS [id number] No Take 20 mg by mouth every 6 (six) hours as needed (pain). [provider] Taking Active Self  ?         ?Med Note [name]   Tue Jan 07, 2021 10:36 AM) [name] prn  ?Semaglutide, 2 MG/DOSE, 8 MG/3ML SOPN [id number] No Inject 2 mg as directed once a week. [name], NP Taking Active   ?sildenafil (VIAGRA) 50 MG tablet [id number]  No Take 1 tablet (50 mg total) by mouth daily as needed for erectile dysfunction. [name] Rival, FNP Taking Active   ?timolol (BETIMOL) 0.5 % ophthalmic solution [id number] No Place 1 drop into both eyes in the morning. [provider] Taking Active Self  ? ?  ?  ? ?  ? ? ?[name] Active Problem List  ? Diagnosis Date Noted  ? OSA  09/11/2021  ? Pre-operative clearance 06/19/2021  ? 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  ? Immunization due 02/06/2021  ? Iron deficiency anemia 12/30/2020  ? History of colonic polyps 12/30/2020  ? Unilateral primary osteoarthritis, right hip 08/13/2020   ? Unilateral primary osteoarthritis, left hip 08/13/2020  ? Muscle spasms of both lower extremities 05/23/2020  ? Need for immunization against influenza 03/06/2020  ? Primary osteoarthritis of both hips 07/10/2019  ? Bilateral primary osteoarthritis of hip 07/10/2019  ? Vitamin D deficiency 04/13/2019  ? Morbid obesity with body mass index (BMI) of 40.0 to 44.9 in adult [facility]) 04/13/2019  ? Depressive disorder 04/13/2019  ? Body mass index (BMI) 45.0-49.9, adult ([place]) 04/13/2019  ? Kidney disease, chronic, stage III (GFR 30-59 ml/min) ([place]) 07/20/2017  ? History of cerebrovascular accident 07/20/2017  ? Gastroesophageal reflux disease 07/20/2017  ? Gastric bypass status for obesity 02/11/2017  ? Benzodiazepine dependence ([place]) 02/11/2017  ? Type 2 diabetes mellitus with diabetic nephropathy ([place]) 08/03/2008  ? Hyperlipidemia 08/03/2008  ? SLEEP APNEA 08/03/2008  ? Sleep apnea 08/03/2008  ? ? ?Care Plan : RN Care Manager Plan Of Care  ?Updates made by [name] Ellison, RN since 09/15/2021 12:00 AM  ?  ? ?Problem: Chronic Disease Management and Care Coordination Needs for DM, HTN, HLD, CKD   ?  ? ?Long-Range Goal: Development of Plan Of Care For Chronic Disease Management and Care Coordination Needs to Assist With Meeting Treatment Goals for DM, CKD, HTN, obesity, chronic hip pain   ?Start Date: 01/07/2021  ?Expected End Date: 01/07/2022  ?Priority: High  ?Note:   ?Current Barriers:  ?Knowledge Deficits related to plan of care for management of HTN, HLD, CKD, and DMII, chronic pain, obesity- [name] states he continues to receive steroid shots in hips alternating each month and requires oxycodone to manage pain, says he will call to schedule his next injection in his right hip, reports insurance has approved bilateral hip replacement surgery and he is doing his best to get his weight to 255 lbs per requirement from his orthopedic surgeon, says the Ozempic is helping him lose weight and have less desire for sweets, states  he weighs weekly on Wednesday and his last home weight was 260 lbs, says he participated in a telehealth visit with his primary care provider on 09/11/21 to discuss sleep study per the request of his new pain management MD, Dr. [name] ?Chronic Disease Management support and education needs related to HTN, HLD, and DMII, chronic pain ? ?RNCM Clinical Goal(s):  ?[name] will verbalize understanding of plan for management of HTN, HLD, CKD and DMII ?take all medications exactly as prescribed and will call provider for medication related questions ?attend all scheduled medical appointments:  ?demonstrate ongoing adherence to prescribed treatment plan for HTN, HLD, CKD and DMII as evidenced by daily/ twice daily monitoring and recording of CBG,  adherence to ADA/ carb modified, fat modified, no added salt diet, continue 30-40 minutes of exercise on your NuStep 5-7 days/week, adherence to prescribed medication regimen including new diabetes medication,  contacting provider for new or worsened symptoms or questions related to HTN, DM or HLD, CKD ?continue to work with Consulting civil engineer and managed Medicaid pharmacist to address care management and care coordination needs related to HTN, HLD, and DMII , CKD ? ?Interventions: ?Inter-disciplinary care team collaboration (see longitudinal plan of care) ?Evaluation of current treatment plan related to  self management and [name]'s adherence to plan as established by provider ?Advise [name] role of RNCM will transition to [name] and follow up telephone appointment scheduled with [name] for 10/21/21 at 1:00 pm ?Reminded [name] of follow up telephone appointment with managed Medicaid pharmacist on 10/21/21 at 9:00 am ?Reviewed appointments with cardiology on 5/5 at 2:40 pm, with primary care provider on 5/11 at 10:00 am and with nephrologist on 11/13/21; ensured [name] has transportation to all appointments  ? ? ?Chronic Kidney Disease (Status: Goal on Track (progressing): YES.)  - all recent provider BP readings are meeting treatment targets, [name] reports his nephrologist was pleased that his creatinine has stabilized and that his BP reading during office visit on 09/03/21 w

## 2021-09-16 ENCOUNTER — Encounter: Payer: Self-pay | Admitting: Nurse Practitioner

## 2021-09-16 DIAGNOSIS — Z7189 Other specified counseling: Secondary | ICD-10-CM | POA: Diagnosis not present

## 2021-09-17 ENCOUNTER — Encounter: Payer: Self-pay | Admitting: Nurse Practitioner

## 2021-09-18 ENCOUNTER — Ambulatory Visit: Payer: Medicaid Other | Admitting: Nurse Practitioner

## 2021-09-26 ENCOUNTER — Ambulatory Visit: Payer: Medicaid Other | Admitting: Cardiology

## 2021-09-27 DIAGNOSIS — M5136 Other intervertebral disc degeneration, lumbar region: Secondary | ICD-10-CM | POA: Diagnosis not present

## 2021-10-02 ENCOUNTER — Ambulatory Visit: Payer: Medicaid Other | Admitting: Nurse Practitioner

## 2021-10-02 DIAGNOSIS — S63250A Unspecified dislocation of right index finger, initial encounter: Secondary | ICD-10-CM | POA: Diagnosis not present

## 2021-10-03 DIAGNOSIS — M160 Bilateral primary osteoarthritis of hip: Secondary | ICD-10-CM | POA: Diagnosis not present

## 2021-10-06 ENCOUNTER — Other Ambulatory Visit: Payer: Self-pay | Admitting: Internal Medicine

## 2021-10-06 ENCOUNTER — Other Ambulatory Visit: Payer: Self-pay | Admitting: Nurse Practitioner

## 2021-10-06 ENCOUNTER — Telehealth: Payer: Self-pay

## 2021-10-06 ENCOUNTER — Encounter: Payer: Self-pay | Admitting: Nurse Practitioner

## 2021-10-06 DIAGNOSIS — M1612 Unilateral primary osteoarthritis, left hip: Secondary | ICD-10-CM | POA: Diagnosis not present

## 2021-10-06 NOTE — Telephone Encounter (Signed)
Pt is calling for follow up on sleep study.  He said the [facility] sent paperwork in on 5/11 and they are waiting for response to schedule him.  ?

## 2021-10-07 ENCOUNTER — Other Ambulatory Visit: Payer: Self-pay | Admitting: Nurse Practitioner

## 2021-10-07 MED ORDER — GABAPENTIN 300 MG PO CAPS: 300.0000 mg | ORAL_CAPSULE | Freq: Every day | ORAL | 0 refills | Status: DC

## 2021-10-07 MED ORDER — CYCLOBENZAPRINE HCL 10 MG PO TABS: ORAL_TABLET | ORAL | 0 refills | Status: DC

## 2021-10-09 DIAGNOSIS — E119 Type 2 diabetes mellitus without complications: Secondary | ICD-10-CM | POA: Diagnosis not present

## 2021-10-09 DIAGNOSIS — R809 Proteinuria, unspecified: Secondary | ICD-10-CM | POA: Diagnosis not present

## 2021-10-09 DIAGNOSIS — E1122 Type 2 diabetes mellitus with diabetic chronic kidney disease: Secondary | ICD-10-CM | POA: Diagnosis not present

## 2021-10-09 DIAGNOSIS — E1129 Type 2 diabetes mellitus with other diabetic kidney complication: Secondary | ICD-10-CM | POA: Diagnosis not present

## 2021-10-09 DIAGNOSIS — I129 Hypertensive chronic kidney disease with stage 1 through stage 4 chronic kidney disease, or unspecified chronic kidney disease: Secondary | ICD-10-CM | POA: Diagnosis not present

## 2021-10-09 DIAGNOSIS — I5032 Chronic diastolic (congestive) heart failure: Secondary | ICD-10-CM | POA: Diagnosis not present

## 2021-10-09 DIAGNOSIS — N189 Chronic kidney disease, unspecified: Secondary | ICD-10-CM | POA: Diagnosis not present

## 2021-10-09 DIAGNOSIS — Z79899 Other long term (current) drug therapy: Secondary | ICD-10-CM | POA: Diagnosis not present

## 2021-10-09 NOTE — Telephone Encounter (Signed)
Spoke with pt I haven't seen any results come in from [facility]. Pt states that he done the study almost 2 weeks ago. Have you seen anything?

## 2021-10-09 NOTE — Telephone Encounter (Signed)
fyi

## 2021-10-10 NOTE — Telephone Encounter (Signed)
Results fax into [name] today will handle getting copy over to pain management and ortho per pt's request

## 2021-10-14 NOTE — Telephone Encounter (Signed)
Spoke with pt advised that papers have been faxed over to ortho and dr crisp. Pt verbalized understanding.

## 2021-10-15 ENCOUNTER — Encounter: Payer: Self-pay | Admitting: Nurse Practitioner

## 2021-10-15 DIAGNOSIS — G894 Chronic pain syndrome: Secondary | ICD-10-CM | POA: Diagnosis not present

## 2021-10-15 DIAGNOSIS — M545 Low back pain, unspecified: Secondary | ICD-10-CM | POA: Diagnosis not present

## 2021-10-15 DIAGNOSIS — M5136 Other intervertebral disc degeneration, lumbar region: Secondary | ICD-10-CM | POA: Diagnosis not present

## 2021-10-16 ENCOUNTER — Encounter: Payer: Self-pay | Admitting: Nurse Practitioner

## 2021-10-16 ENCOUNTER — Ambulatory Visit: Payer: Medicaid Other | Admitting: Nurse Practitioner

## 2021-10-16 ENCOUNTER — Other Ambulatory Visit: Payer: Self-pay

## 2021-10-16 VITALS — BP 120/80 | HR 89 | Ht 69.0 in | Wt 265.0 lb

## 2021-10-16 DIAGNOSIS — E1121 Type 2 diabetes mellitus with diabetic nephropathy: Secondary | ICD-10-CM | POA: Diagnosis not present

## 2021-10-16 DIAGNOSIS — N1832 Chronic kidney disease, stage 3b: Secondary | ICD-10-CM | POA: Diagnosis not present

## 2021-10-16 DIAGNOSIS — E782 Mixed hyperlipidemia: Secondary | ICD-10-CM

## 2021-10-16 DIAGNOSIS — M160 Bilateral primary osteoarthritis of hip: Secondary | ICD-10-CM

## 2021-10-16 DIAGNOSIS — I10 Essential (primary) hypertension: Secondary | ICD-10-CM | POA: Diagnosis not present

## 2021-10-16 DIAGNOSIS — Z23 Encounter for immunization: Secondary | ICD-10-CM | POA: Diagnosis not present

## 2021-10-16 NOTE — Assessment & Plan Note (Signed)
Patient educated on CDC recommendation for the vaccine. Verbal consent was obtained from the patient, vaccine administered by nurse, no sign of adverse reactions noted at this time. Patient education on arm soreness and use of tylenol  was discussed. Patient educated on the signs and symptoms of adverse effect and advise to contact the office if they occur.

## 2021-10-16 NOTE — Patient Instructions (Signed)
  Please get fasting labs tomorrow.  It is important that you exercise regularly at least 30 minutes 5 times a week.  Think about what you will eat, plan ahead. Choose " clean, green, fresh or frozen" over canned, processed or packaged foods which are more sugary, salty and fatty. 70 to 75% of food eaten should be vegetables and fruit. Three meals at set times with snacks allowed between meals, but they must be fruit or vegetables. Aim to eat over a 12 hour period , example 7 am to 7 pm, and STOP after  your last meal of the day. Drink water,generally about 64 ounces per day, no other drink is as healthy. Fruit juice is best enjoyed in a healthy way, by EATING the fruit.  Thanks for choosing [facility], we consider it a privelige to serve you.

## 2021-10-16 NOTE — Telephone Encounter (Signed)
Please advise pt has an appt today

## 2021-10-16 NOTE — Progress Notes (Signed)
   [name]     MRN: [id number]      DOB: 27-Mar-1971   HPI [name] with past medical history of type 2 diabetes, sleep apnea, hypertension, hyperlipidemia, primary osteoarthritis of both hips, morbid obesity is here for follow up and re-evaluation of chronic medical conditions, medication management.    [name] reports that he has been taking all medications as prescribed, currently denies any adverse reactions to current medications since his last visit.  He denies any specific new complaints today.     ROS Denies recent fever or chills. Denies sinus pressure, nasal congestion, ear pain or sore throat. Denies chest congestion, productive cough or wheezing. Denies chest pains, palpitations and leg swelling Denies abdominal pain, nausea, vomiting,diarrhea or constipation.   Denies dysuria, frequency, hesitancy or incontinence. Denies headaches, seizures, numbness, or tingling. Denies depression, anxiety or insomnia.    PE  BP 120/80 (BP Location: Right Arm, Cuff Size: Large)   Pulse 89   Ht '5\' 9"'$  (1.753 m)   Wt 265 lb (120.2 kg)   SpO2 98%   BMI 39.13 kg/m   [name] alert and oriented and in no cardiopulmonary distress.  Chest: Clear to auscultation bilaterally.  CVS: S1, S2 no murmurs, no S3.Regular rate.  ABD: Soft non tender.   Ext: No edema  MS: adequate  ROM spine, shoulders, and knees, decreased ROM of hips using a walker  Psych: Good eye contact, normal affect. Memory intact not anxious or depressed appearing.  CNS: CN 2-12 intact, power,  normal throughout.no focal deficits noted.   Assessment & Plan  Type 2 diabetes mellitus with diabetic nephropathy (HCC) Chronic medical conditions Fasting CBG today was 135.  Currently on semaglutide 2 mg once weekly injection, Farxiga 5 mg tablets Avoid sugar sweets soda Urine creatinine labs today A1c today On losartan 25 mg daily, atorvastatin 40 mg daily   Primary osteoarthritis of both  hips Recently had steroid injection.  Still awaiting surgery  Continue oxycodone '20mg'$  every 6 hours prn, Flexeril 10 mg twice daily as needed, gabapentin 300 mg daily at bedtime  High blood pressure BP Readings from Last 3 Encounters:  10/16/21 120/80  08/01/21 107/76  06/26/21 110/76  Chronic condition well-controlled on losartan 25 mg daily Continue current medication DASH diet advised engage in regular daily exercises at least for 50 minutes weekly as tolerated CMP today Follow-up in 4 months  Kidney disease, chronic, stage III (GFR 30-59 ml/min) (HCC) Chronic condition followed by nephrology On Farxiga 5 mg daily Avoid NSAIDs, drink at least 64 ounces of water daily [name] encouraged to maintain close follow-up with nephrology Lab Results  Component Value Date   NA 137 05/15/2021   K 4.1 05/15/2021   CO2 26 05/15/2021   BUN 25 (H) 05/15/2021   CREATININE 1.63 (H) 05/15/2021   CALCIUM 9.1 05/15/2021   GLUCOSE 128 (H) 05/15/2021    Hyperlipidemia Currently on atorvastatin 40 mg daily Check lipid panel Avoid fried fatty foods  Need for varicella vaccine [name] educated on CDC recommendation for the vaccine. Verbal consent was obtained from the [name], vaccine administered by nurse, no sign of adverse reactions noted at this time. [name] education on arm soreness and use of tylenol  was discussed. [name] educated on the signs and symptoms of adverse effect and advise to contact the office if they occur.

## 2021-10-16 NOTE — Assessment & Plan Note (Signed)
Currently on atorvastatin 40 mg daily Check lipid panel Avoid fried fatty foods

## 2021-10-16 NOTE — Assessment & Plan Note (Addendum)
Chronic medical conditions Fasting CBG today was 135.  Currently on semaglutide 2 mg once weekly injection, Farxiga 5 mg tablets Avoid sugar sweets soda Urine creatinine labs today A1c today On losartan 25 mg daily, atorvastatin 40 mg daily

## 2021-10-16 NOTE — Assessment & Plan Note (Signed)
BP Readings from Last 3 Encounters:  10/16/21 120/80  08/01/21 107/76  06/26/21 110/76  Chronic condition well-controlled on losartan 25 mg daily Continue current medication DASH diet advised engage in regular daily exercises at least for 50 minutes weekly as tolerated CMP today Follow-up in 4 months

## 2021-10-16 NOTE — Assessment & Plan Note (Signed)
Chronic condition followed by nephrology On Farxiga 5 mg daily Avoid NSAIDs, drink at least 64 ounces of water daily Patient encouraged to maintain close follow-up with nephrology Lab Results  Component Value Date   NA 137 05/15/2021   K 4.1 05/15/2021   CO2 26 05/15/2021   BUN 25 (H) 05/15/2021   CREATININE 1.63 (H) 05/15/2021   CALCIUM 9.1 05/15/2021   GLUCOSE 128 (H) 05/15/2021

## 2021-10-16 NOTE — Assessment & Plan Note (Addendum)
Recently had steroid injection.  Still awaiting surgery  Continue oxycodone '20mg'$  every 6 hours prn, Flexeril 10 mg twice daily as needed, gabapentin 300 mg daily at bedtime

## 2021-10-17 NOTE — Telephone Encounter (Signed)
Referral placed.

## 2021-10-20 LAB — LIPID PANEL
Chol/HDL Ratio: 3.2 ratio (ref 0.0–5.0)
Cholesterol, Total: 133 mg/dL (ref 100–199)
HDL: 42 mg/dL (ref >39)
LDL Chol Calc (NIH): 62 mg/dL (ref 0–99)
Triglycerides: 174 mg/dL — ABNORMAL HIGH (ref 0–149)
VLDL Cholesterol Cal: 29 mg/dL (ref 5–40)

## 2021-10-20 LAB — CMP14+EGFR
ALT: 23 IU/L (ref 0–44)
AST: 22 IU/L (ref 0–40)
Albumin/Globulin Ratio: 1.4 (ref 1.2–2.2)
Albumin: 4.2 g/dL (ref 3.8–4.9)
Alkaline Phosphatase: 113 IU/L (ref 44–121)
BUN/Creatinine Ratio: 16 (ref 9–20)
BUN: 27 mg/dL — ABNORMAL HIGH (ref 6–24)
Bilirubin Total: 1.3 mg/dL — ABNORMAL HIGH (ref 0.0–1.2)
CO2: 23 mmol/L (ref 20–29)
Calcium: 9.7 mg/dL (ref 8.7–10.2)
Chloride: 100 mmol/L (ref 96–106)
Creatinine, Ser: 1.69 mg/dL — ABNORMAL HIGH (ref 0.76–1.27)
Globulin, Total: 3 g/dL (ref 1.5–4.5)
Glucose: 115 mg/dL — ABNORMAL HIGH (ref 70–99)
Potassium: 4.4 mmol/L (ref 3.5–5.2)
Sodium: 139 mmol/L (ref 134–144)
Total Protein: 7.2 g/dL (ref 6.0–8.5)
eGFR: 48 mL/min/{1.73_m2} — ABNORMAL LOW (ref >59)

## 2021-10-20 LAB — MICROALBUMIN / CREATININE URINE RATIO
Creatinine, Urine: 120.9 mg/dL
Microalb/Creat Ratio: 132 mg/g creat — ABNORMAL HIGH (ref 0–29)
Microalbumin, Urine: 159.9 ug/mL

## 2021-10-20 LAB — HEMOGLOBIN A1C
Est. average glucose Bld gHb Est-mCnc: 128 mg/dL
Hgb A1c MFr Bld: 6.1 % — ABNORMAL HIGH (ref 4.8–5.6)

## 2021-10-20 NOTE — Progress Notes (Signed)
A1C is well under control.   EGFR slightly reduced compared to previous labs, drink at least 64 ounces of water daily ,avoid ibuprofen, please maintain close follow up with nephrologist .   LDL is at goal of less than 70.   Albuminuria much improved than previous labs   Continue current medications.

## 2021-10-21 ENCOUNTER — Encounter: Payer: Self-pay | Admitting: Nurse Practitioner

## 2021-10-21 ENCOUNTER — Other Ambulatory Visit: Payer: Self-pay | Admitting: Pharmacist

## 2021-10-21 ENCOUNTER — Other Ambulatory Visit: Payer: Self-pay | Admitting: Obstetrics and Gynecology

## 2021-10-21 DIAGNOSIS — Z8673 Personal history of transient ischemic attack (TIA), and cerebral infarction without residual deficits: Secondary | ICD-10-CM

## 2021-10-21 DIAGNOSIS — E1121 Type 2 diabetes mellitus with diabetic nephropathy: Secondary | ICD-10-CM

## 2021-10-21 DIAGNOSIS — E782 Mixed hyperlipidemia: Secondary | ICD-10-CM

## 2021-10-21 NOTE — Patient Outreach (Signed)
Medicaid Managed Care   Nurse Care Manager Note  10/21/2021 Name:  [name] MRN:  [id number] DOB:  04-17-1971  [name] is an 51 y.o. year old male who is a primary patient of [name] Rival, FNP.  The [facility] Managed Care Coordination team was consulted for assistance with:    Chronic healthcare management needs, DM, HTN, HLD, CKD, chronic pain, OSA, glaucoma, osteoarthritis  [name] was given information about Medicaid Managed Care Coordination team services today. [name] Patient agreed to services and verbal consent obtained.  Engaged with patient by telephone for follow up visit in response to provider referral for case management and/or care coordination services.   Assessments/Interventions:  Review of past medical history, allergies, medications, health status, including review of consultants reports, laboratory and other test data, was performed as part of comprehensive evaluation and provision of chronic care management services.  SDOH (Social Determinants of Health) assessments and interventions performed:  Care Plan  No Known Allergies  Medications Reviewed Today     Reviewed by [name] Medicus, RN (Registered Nurse) on 10/21/21 at 1313  Med List Status: <None>   Medication Order Taking? Sig Documenting Provider Last Dose Status Informant  atorvastatin (LIPITOR) 40 MG tablet [id number] No Take 1 tablet (40 mg total) by mouth daily. [name] Larsson, NP Taking Active            Med Note [name], [place] Aug 19, 2021  9:00 AM)    blood glucose meter kit and supplies KIT [id number] No Test daily Dx  r73.03 [name] Deutscher, DO Taking Active Self  Blood Pressure Monitor KIT [id number] No 1 each by Does not apply route daily. [name] Larsson, NP Taking Active   cyclobenzaprine (FLEXERIL) 10 MG tablet [id number] No TAKE 1 TABLET BY MOUTH 2 TIMES A DAY AS NEEDED FOR MUSCLE SPASMS. [name] Rival, FNP Taking Active   FARXIGA 5 MG TABS tablet  [id number] No Take 5 mg by mouth every morning. [provider] Taking Active   gabapentin (NEURONTIN) 300 MG capsule [id number] No Take 1 capsule (300 mg total) by mouth at bedtime. [name] Rival, FNP Taking Active   glucose blood test strip [id number] No Use as instructed. Can check up to 4 times daily. [name] Larsson, NP Taking Active   latanoprost (XALATAN) 0.005 % ophthalmic solution [id number] No Place 1 drop into both eyes at bedtime. [provider] Taking Active Self  losartan (COZAAR) 25 MG tablet [id number] No Take 1 tablet (25 mg total) by mouth daily. [name] Spar, MD Taking Active   Multiple Vitamin (MULTIVITAMIN) tablet [id number] No Take 1 tablet by mouth daily. [provider] Taking Active   NARCAN 4 MG/0.1ML LIQD nasal spray kit [id number] No Place 0.4 mg into the nose once.  Patient not taking: Reported on 10/21/2021   [provider] Not Taking Active Self           Med Note ([place] Aug 19, 2021  9:06 AM)    Oxycodone HCl 20 MG TABS [id number] No Take 20 mg by mouth every 6 (six) hours as needed (pain). [provider] Taking Active Self           Med Note [name]   Tue Jan 07, 2021 10:36 AM) [name] prn  Semaglutide, 2 MG/DOSE, 8 MG/3ML SOPN [id number] No Inject 2 mg as directed once a week. [name] Larsson, NP Taking  Active   sildenafil (VIAGRA) 50 MG tablet [id number] No Take 1 tablet (50 mg total) by mouth daily as needed for erectile dysfunction. [name] Rival, FNP Taking Active   timolol (BETIMOL) 0.5 % ophthalmic solution [id number] No Place 1 drop into both eyes in the morning. [provider] Taking Active Self  vitamin B-12 (CYANOCOBALAMIN) 1000 MCG tablet [id number] No Take 1,000 mcg by mouth daily. [provider] Taking Active            Patient Active Problem List   Diagnosis Date Noted   Need for varicella vaccine 10/16/2021   OSA  09/11/2021   Pre-operative  clearance 06/19/2021   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   Immunization due 02/06/2021   Iron deficiency anemia 12/30/2020   History of colonic polyps 12/30/2020   Unilateral primary osteoarthritis, right hip 08/13/2020   Unilateral primary osteoarthritis, left hip 08/13/2020   Muscle spasms of both lower extremities 05/23/2020   Need for immunization against influenza 03/06/2020   Primary osteoarthritis of both hips 07/10/2019   Bilateral primary osteoarthritis of hip 07/10/2019   Vitamin D deficiency 04/13/2019   Morbid obesity with body mass index (BMI) of 40.0 to 44.9 in adult ([place]) 04/13/2019   Depressive disorder 04/13/2019   Body mass index (BMI) 45.0-49.9, adult ([place]) 04/13/2019   Kidney disease, chronic, stage III (GFR 30-59 ml/min) ([place]) 07/20/2017   History of cerebrovascular accident 07/20/2017   Gastroesophageal reflux disease 07/20/2017   Gastric bypass status for obesity 02/11/2017   Benzodiazepine dependence ([place]) 02/11/2017   Type 2 diabetes mellitus with diabetic nephropathy ([place]) 08/03/2008   Hyperlipidemia 08/03/2008   High blood pressure 08/03/2008   SLEEP APNEA 08/03/2008   Sleep apnea 08/03/2008   Conditions to be addressed/monitored per PCP order:  Chronic healthcare management needs, DM, HTN, HLD, CKD, chronic pain, OSA, glaucoma, osteoarthritis  Care Plan : RN Care Manager Plan Of Care  Updates made by [name] Medicus, RN since 10/21/2021 12:00 AM     Problem: Chronic Disease Management and Care Coordination Needs for DM, HTN, HLD, CKD      Long-Range Goal: Development of Plan Of Care For Chronic Disease Management and Care Coordination Needs to Assist With Meeting Treatment Goals for DM, CKD, HTN, obesity, chronic hip pain   Start Date: 01/07/2021  Expected End Date: 01/07/2022  Priority: High  Note:   Current Barriers:  Knowledge Deficits related to plan of care for management of HTN, HLD, CKD, and  DMII, chronic pain, obesity- patient states he continues to receive steroid shots in hips alternating each month and requires oxycodone to manage pain, says he will call to schedule his next injection in his right hip, reports insurance has approved bilateral hip replacement surgery and he is doing his best to get his weight to 255 lbs per requirement from his orthopedic surgeon, says the Ozempic is helping him lose weight and have less desire for sweets, states he weighs weekly on Wednesday and his last  weight was 265lbs at PCP office 10/16/21.  Patient awaiting CPAP-sleep study completed.  Patient's most recent A1C=6.1 on 10/17/21.Checks blood pressure occasionally and is fine per patient.  Patient states he must find a new pain management provider as Dr. [name] no longer accepts [facility] Managed Medicaid Chronic Disease Management support and education needs related to HTN, HLD, and DMII, chronic pain  RNCM Clinical Goal(s):  Patient will verbalize understanding of plan for management of HTN, HLD,  CKD and DMII take all medications exactly as prescribed and will call provider for medication related questions attend all scheduled medical appointments:  demonstrate ongoing adherence to prescribed treatment plan for HTN, HLD, CKD and DMII as evidenced by daily/ twice daily monitoring and recording of CBG,  adherence to ADA/ carb modified, fat modified, no added salt diet, continue 30-40 minutes of exercise on your NuStep 5-7 days/week, adherence to prescribed medication regimen including new diabetes medication,  contacting provider for new or worsened symptoms or questions related to HTN, DM or HLD, CKD continue to work with Consulting civil engineer and managed Medicaid pharmacist to address care management and care coordination needs related to HTN, HLD, and DMII , CKD  Interventions: Inter-disciplinary care team collaboration (see longitudinal plan of care) Evaluation of current treatment plan related to   self management and patient's adherence to plan as established by provider Reviewed appointments and insured patient has transportation to all appointments   Chronic Kidney Disease (Status: Goal on Track (progressing): YES.) - all recent provider BP readings are meeting treatment targets, patient reports his nephrologist was pleased that his creatinine has stabilized and that his BP reading during office visit was good ,  patient states he checks his BP occasionally and that readings are usually <130/<80,  120/80 at most recent PCP visit, he says provider office readings are higher at times because he is in pain from the walk to the offices. 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, and added Farxiga 5 mg to patient's medication regimen on 09/03/21, he will see his nephrologist again on 11/13/21 Last practice recorded BP readings:    BP Readings from Last 3 Encounters:  08/01/21 107/76  06/26/21 110/76  06/19/21 116/76         Most recent eGFR/CrCl:  Lab Results  Component Value Date   NA 137 05/15/2021   K 4.1 05/15/2021   CREATININE 1.63 (H) 05/15/2021   EGFR 51 (L) 05/15/2021   GFRNONAA 37 (L) 01/10/2020   GLUCOSE 128 (H) 05/15/2021    Reviewed medications with patient and discussed importance of compliance   Assessed frequency of self monitored BP, reviewed home and provider office readings, reviewed treatment targets and reviewed lifestyle strategies   Reviewed addition of Farxiga to medication regimen for blood pressure, blood sugar and weight control in addition to slowing progression of CKD and lowering risks of heart attack, stroke and cardiac death , assessed tolerance of Farxiga and reviewed major adverse side effects   Reviewed scheduled/upcoming provider appointments  Discussed plans with patient for ongoing care management follow up and provided patient with direct contact information for care management team    Diabetes:   (Status: Goal on track: YES.)- patient reports self monitored fasting CBGs are usually <130, checks 1-2 times a day.  Most recent A1C=6.1 on 10/17/21. Lab Results  Component Value Date   HGBA1C 7.0 (H) 05/15/2021  Assessed frequency of CBG testing and reviewed fasting target Reviewed medications, including recent addition of Farxiga to medication regimen with patient and discussed importance of good medication taking behavior Discussed plans with patient for ongoing care management follow up and provided patient with direct contact information for care management team;               Health Maintenance (Status: Condition stable. Not addressed this visit.) - patient completed colonoscopy and endoscopy on 02/26/21 and was aware of pathology report- non cancerous Patient interviewed about adult health maintenance  status   Pneumonia Vaccine-  received pneumococcal  vaccine on 05/15/21 Influenza Vaccine- states he received flu shot on 02/06/21 COVID vaccination   - pt states he never received , will message provider prior to his appointment in May Shingles Vaccination- received first dose on 02/06/21, did not receive 2nd vaccination so will message provider prior to his appointment on May 11,2023-second shingles vaccine received 10/17/21 Regular eye checkups- says he sees his eye doctor, Dr [name],  every 6 months due to glaucoma and his next appointment is in November  Advanced Directives- states he does not have and does not want information at this time  Dental Care- states his dentist is Dr Mina Marble in [place] and he sees her once-twice yearly but he is currently overdue for check up and cleaning, emphasized importance of regular dental care especially with his diagnosis of DM      Hyperlipidemia:  (Status: Condition stable. Not addressed this visit. Lab Results  Component Value Date   CHOL 162 05/15/2021   HDL 48 05/15/2021   LDLCALC 88 05/15/2021   TRIG 147 05/15/2021   CHOLHDL 6.1 (H) 12/20/2019      Medication review performed; medication list updated in electronic medical record.   Hypertension: (Status: Goal on Track (progressing): YES.)-patient states he checks his BP occasionally and that readings are usually <130/<80, he says provider office readings are higher at times because he is in pain from the walk to the offices. 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 Last practice recorded BP readings:  BP Readings from Last 3 Encounters:  08/01/21 107/76  06/26/21 110/76  06/19/21 116/76    Most recent eGFR/CrCl:  Lab Results  Component Value Date   NA 137 05/15/2021   K 4.1 05/15/2021   CREATININE 1.63 (H) 05/15/2021   EGFR 51 (L) 05/15/2021   GFRNONAA 37 (L) 01/10/2020   GLUCOSE 128 (H) 05/15/2021   Evaluation of current treatment plan related to hypertension self management and patient's adherence to plan as established by provider;   Reviewed BP medications and assessed medication taking behavior ensuring he is taking Losartan that was recently added to his regimen Assessed frequency of self monitored blood press and readings, reviewed treatment targets Discussed plans with patient for ongoing care management follow up and provided patient with direct contact information for care management team; Reviewed scheduled/upcoming provider appointments  Pain:  (Status: Goal on track: NO.)patient states bilateral hip pain continues and requires regular injections and oxycodone to control, says he must find a new pain management provider as Dr. [name] no longer accepts [facility]. Medications reviewed Reviewed provider established plan for pain management; Counseled on the importance of reporting any/all new or changed pain symptoms or management strategies to pain management provider; Positive reinforcement given to patient regarding ongoing weight loss to meet goal to qualify for bilateral hip  surgery  Weight Loss Interventions:  (Status:  New goal.) Long Term Goal- pt and gastric bypass in 2010 in [place], currently meeting with RD, [facility] [name] to assist with meeting goal weight of 255 lbs or less so that he may proceed with bilateral hip surgery, patient says he only drinks water but struggles with concentrated sweet consumption although the Ozempic has also helped with his CHO cravings, unfortunately patient canceled follow up appointment with dietician on 08/19/21 Provided verbal and/or written education to patient re: provider recommended life style modifications  Reviewed recommended dietary changes: avoid fad diets, make  small/incremental dietary and exercise changes, eat at the table and avoid eating in front of the TV, plan management of cravings, monitor snacking and cravings in food diary Congratulated patient on good medication taking behavior, ongoing weight loss  and 30-40 minutes of Nu Step home exercise each evening Reviewed [place] benefit related to assisting with weight loss Encouraged patient to reschedule with [name] for medical nutrition therapy  Patient Goals/Self-Care Activities: Take medications as prescribed   Attend all scheduled provider appointments Call pharmacy for medication refills 3-7 days in advance of running out of medications Perform all self care activities independently  Perform IADL's (shopping, preparing meals, housekeeping, managing finances) independently Call provider office for new concerns or questions  Call and schedule dentist appointment with [name] for check up and cleaning as soon as possible , phone number [phone] Reschedule a follow up appointment with [name], dietician and [place], to help with ongoing weight loss   Long-Range Goal: Establish Plan of Care for Chronic Disease Management Needs   Start Date: 10/21/2021  Expected End Date: 01/21/2022  Priority: High  Note:   Timeframe:  Long-Range  Goal Priority:  High Start Date:     10/21/21                        Expected End Date:  ongoing                     Follow Up Date 06 /30/23    - schedule appointment for vaccines needed due to my age or health - schedule recommended health tests (blood work, mammogram, colonoscopy, pap test) - schedule and keep appointment for annual check-up    Why is this important?   Screening tests can find diseases early when they are easier to treat.  Your doctor or nurse will talk with you about which tests are important for you.  Getting shots for common diseases like the flu and shingles will help prevent them.     Follow Up:  Patient agrees to Care Plan and Follow-up.  Plan: The Managed Medicaid care management team will reach out to the patient again over the next 30 days. and The  Patient has been provided with contact information for the Managed Medicaid care management team and has been advised to call with any health related questions or concerns.  Date/time of next scheduled RN care management/care coordination outreach:  11/21/21 at [phone].

## 2021-10-21 NOTE — Patient Instructions (Signed)
[place],   It was great working with you!  Congratulations on having your A1C, blood pressure, and cholesterol all at goal! Keep up the fantastic work.   Please reach out with any future questions or concerns.   Catie Hedwig Morton, PharmD, [place] [phone]   Mr. Sanna was given information about Medicaid Managed Care team care coordination services as a part of their Healthy [facility] Medicaid benefit. [name] verbally consented to engagement with the [facility] Managed Care team.   If you are experiencing a medical emergency, please call 911 or report to your local emergency department or urgent care.   If you have a non-emergency medical problem during routine business hours, please contact your provider's office and ask to speak with a nurse.   For questions related to your Healthy [facility] health plan, please call: [phone] or visit the homepage here: [email or web address]  If you would like to schedule transportation through your Healthy [facility] plan, please call the following number at least 2 days in advance of your appointment: [phone]  For information about your ride after you set it up, call Ride Assist at [phone]. Use this number to activate a Will Call pickup, or if your transportation is late for a scheduled pickup. Use this number, too, if you need to make a change or cancel a previously scheduled reservation.  If you need transportation services right away, call [phone]. The after-hours call center is staffed 24 hours to handle ride assistance and urgent reservation requests (including discharges) 365 days a year. Urgent trips include sick visits, hospital discharge requests and life-sustaining treatment.

## 2021-10-21 NOTE — Patient Instructions (Signed)
Hi Mr. Fick, nice to speak with you today-have a nice afternoon!  Mr. Lancour was given information about Medicaid Managed Care team care coordination services as a part of their Healthy [facility] Medicaid benefit. [name] verbally consented to engagement with the [facility] Managed Care team.   If you are experiencing a medical emergency, please call 911 or report to your local emergency department or urgent care.   If you have a non-emergency medical problem during routine business hours, please contact your provider's office and ask to speak with a nurse.   For questions related to your Healthy [facility] health plan, please call: [phone] or visit the homepage here: [email or web address]  If you would like to schedule transportation through your Healthy [facility] plan, please call the following number at least 2 days in advance of your appointment: [phone]  For information about your ride after you set it up, call Ride Assist at [phone]. Use this number to activate a Will Call pickup, or if your transportation is late for a scheduled pickup. Use this number, too, if you need to make a change or cancel a previously scheduled reservation.  If you need transportation services right away, call [phone]. The after-hours call center is staffed 24 hours to handle ride assistance and urgent reservation requests (including discharges) 365 days a year. Urgent trips include sick visits, hospital discharge requests and life-sustaining treatment.  Call the [facility] at [phone], at any time, 24 hours a day, 7 days a week. If you are in danger or need immediate medical attention call 911.  If you would like help to quit smoking, call 1-800-QUIT-NOW 212-696-8411) OR Espaol: 1-855-Djelo-Ya (7-654-650-3546) o para ms informacin haga clic aqu or Text READY to 200-400 to register via text  Mr. Wheatley - following are  the goals we discussed in your visit today:   Goals Addressed    Long-Range Goal: Establish Plan of Care for Chronic Disease Management Needs   Start Date: 10/21/2021  Expected End Date: 01/21/2022  Priority: High  Note:   Timeframe:  Long-Range Goal Priority:  High Start Date:     10/21/21                        Expected End Date:  ongoing                     Follow Up Date 06 /30/23    - schedule appointment for vaccines needed due to my age or health - schedule recommended health tests (blood work, mammogram, colonoscopy, pap test) - schedule and keep appointment for annual check-up    Why is this important?   Screening tests can find diseases early when they are easier to treat.  Your doctor or nurse will talk with you about which tests are important for you.  Getting shots for common diseases like the flu and shingles will help prevent them.     Patient verbalizes understanding of instructions and care plan provided today and agrees to view in [place]. Active MyChart status and patient understanding of how to access instructions and care plan via MyChart confirmed with patient.     The Managed Medicaid care management team will reach out to the patient again over the next 30 days.  The  Patient  has been provided with contact information for the Managed Medicaid care management team and has been advised to call with any health related questions or concerns.  [name] RN, BSN [place] Management Coordinator - Managed Medicaid High Risk [phone]   Following is a copy of your plan of care:  Care Plan : [place]  Updates made by Gayla Medicus, RN since 10/21/2021 12:00 AM     Problem: Chronic Disease Management and Care Coordination Needs for DM, HTN, HLD, CKD      Long-Range Goal: Development of Plan Of Care For Chronic Disease Management and Care Coordination Needs to Assist With Meeting Treatment Goals for DM,  CKD, HTN, obesity, chronic hip pain   Start Date: 01/07/2021  Expected End Date: 01/07/2022  Priority: High  Note:   Current Barriers:  Knowledge Deficits related to plan of care for management of HTN, HLD, CKD, and DMII, chronic pain, obesity- patient states he continues to receive steroid shots in hips alternating each month and requires oxycodone to manage pain, says he will call to schedule his next injection in his right hip, reports insurance has approved bilateral hip replacement surgery and he is doing his best to get his weight to 255 lbs per requirement from his orthopedic surgeon, says the Ozempic is helping him lose weight and have less desire for sweets, states he weighs weekly on Wednesday and his last  weight was 265lbs at PCP office 10/16/21.  Patient awaiting CPAP-sleep study completed.  Patient's most recent A1C=6.1 on 10/17/21.Checks blood pressure occasionally and is fine per patient.  Patient states he must find a new pain management provider as Dr. [name] no longer accepts [facility] Managed Medicaid Chronic Disease Management support and education needs related to HTN, HLD, and DMII, chronic pain  RNCM Clinical Goal(s):  Patient will verbalize understanding of plan for management of HTN, HLD, CKD and DMII take all medications exactly as prescribed and will call provider for medication related questions attend all scheduled medical appointments:  demonstrate ongoing adherence to prescribed treatment plan for HTN, HLD, CKD and DMII as evidenced by daily/ twice daily monitoring and recording of CBG,  adherence to ADA/ carb modified, fat modified, no added salt diet, continue 30-40 minutes of exercise on your NuStep 5-7 days/week, adherence to prescribed medication regimen including new diabetes medication,  contacting provider for new or worsened symptoms or questions related to HTN, DM or HLD, CKD continue to work with RN Care Manager and managed Medicaid pharmacist to address care  management and care coordination needs related to HTN, HLD, and DMII , CKD  Interventions: Inter-disciplinary care team collaboration (see longitudinal plan of care) Evaluation of current treatment plan related to  self management and patient's adherence to plan as established by provider Reviewed appointments and insured patient has transportation to all appointments   Chronic Kidney Disease (Status: Goal on Track (progressing): YES.) - all recent provider BP readings are meeting treatment targets, patient reports his nephrologist was pleased that his creatinine has stabilized and that his BP reading during office visit was good ,  patient states he checks his BP occasionally and that readings are usually <130/<80,  120/80 at most recent PCP visit, he says provider office readings are higher at times because he is in pain from the walk to the offices. 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, and added Farxiga 5 mg to patient's medication regimen on 09/03/21, he will see his nephrologist again on 11/13/21 Last practice recorded BP readings:    BP Readings from Last 3 Encounters:  08/01/21 107/76  06/26/21 110/76  06/19/21 116/76         Most recent eGFR/CrCl:  Lab Results  Component Value Date   NA 137 05/15/2021   K 4.1 05/15/2021   CREATININE 1.63 (H) 05/15/2021   EGFR 51 (L) 05/15/2021   GFRNONAA 37 (L) 01/10/2020   GLUCOSE 128 (H) 05/15/2021    Reviewed medications with patient and discussed importance of compliance   Assessed frequency of self monitored BP, reviewed home and provider office readings, reviewed treatment targets and reviewed lifestyle strategies   Reviewed addition of Farxiga to medication regimen for blood pressure, blood sugar and weight control in addition to slowing progression of CKD and lowering risks of heart attack, stroke and cardiac death , assessed tolerance of Farxiga and reviewed major adverse side  effects   Reviewed scheduled/upcoming provider appointments  Discussed plans with patient for ongoing care management follow up and provided patient with direct contact information for care management team    Diabetes:  (Status: Goal on track: YES.)- patient reports self monitored fasting CBGs are usually <130, checks 1-2 times a day.  Most recent A1C=6.1 on 10/17/21. Lab Results  Component Value Date   HGBA1C 7.0 (H) 05/15/2021  Assessed frequency of CBG testing and reviewed fasting target Reviewed medications, including recent addition of Farxiga to medication regimen with patient and discussed importance of good medication taking behavior Discussed plans with patient for ongoing care management follow up and provided patient with direct contact information for care management team;               Health Maintenance (Status: Condition stable. Not addressed this visit.) - patient completed colonoscopy and endoscopy on 02/26/21 and was aware of pathology report- non cancerous Patient interviewed about adult health maintenance status   Pneumonia Vaccine-  received pneumococcal  vaccine on 05/15/21 Influenza Vaccine- states he received flu shot on 02/06/21 COVID vaccination   - pt states he never received , will message provider prior to his appointment in May Shingles Vaccination- received first dose on 02/06/21, did not receive 2nd vaccination so will message provider prior to his appointment on May 11,2023-second shingles vaccine received 10/17/21 Regular eye checkups- says he sees his eye doctor, Dr [name],  every 6 months due to glaucoma and his next appointment is in November  Advanced Directives- states he does not have and does not want information at this time  Dental Care- states his dentist is Dr Mina Marble in [place] and he sees her once-twice yearly but he is currently overdue for check up and cleaning, emphasized importance of regular dental care especially with his diagnosis of DM       Hyperlipidemia:  (Status: Condition stable. Not addressed this visit. Lab Results  Component Value Date   CHOL 162 05/15/2021   HDL 48 05/15/2021   LDLCALC 88 05/15/2021   TRIG 147 05/15/2021   CHOLHDL 6.1 (H) 12/20/2019     Medication review performed; medication list updated in electronic medical record.   Hypertension: (Status: Goal on Track (progressing): YES.)-patient states he checks his BP occasionally and that readings are usually <130/<80, he says provider office readings are higher at times because he is in pain from the walk to the offices. 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 Last practice recorded BP readings:  BP Readings from Last 3 Encounters:  08/01/21 107/76  06/26/21 110/76  06/19/21 116/76    Most recent eGFR/CrCl:  Lab Results  Component Value Date   NA 137 05/15/2021   K 4.1 05/15/2021   CREATININE 1.63 (H) 05/15/2021   EGFR 51 (L) 05/15/2021   GFRNONAA 37 (L) 01/10/2020   GLUCOSE 128 (H) 05/15/2021   Evaluation of current treatment plan related to hypertension self management and patient's adherence to plan as established by provider;   Reviewed BP medications and assessed medication taking behavior ensuring he is taking Losartan that was recently added to his regimen Assessed frequency of self monitored blood press and readings, reviewed treatment targets Discussed plans with patient for ongoing care management follow up and provided patient with direct contact information for care management team; Reviewed scheduled/upcoming provider appointments  Pain:  (Status: Goal on track: NO.)patient states bilateral hip pain continues and requires regular injections and oxycodone to control, says he must find a new pain management provider as Dr. [name] no longer accepts [facility]. Medications reviewed Reviewed provider established plan for pain management; Counseled on the importance  of reporting any/all new or changed pain symptoms or management strategies to pain management provider; Positive reinforcement given to patient regarding ongoing weight loss to meet goal to qualify for bilateral hip surgery  Weight Loss Interventions:  (Status:  New goal.) Long Term Goal- pt and gastric bypass in 2010 in [place], currently meeting with RD, CDCES [name] to assist with meeting goal weight of 255 lbs or less so that he may proceed with bilateral hip surgery, patient says he only drinks water but struggles with concentrated sweet consumption although the Ozempic has also helped with his CHO cravings, unfortunately patient canceled follow up appointment with dietician on 08/19/21 Provided verbal and/or written education to patient re: provider recommended life style modifications  Reviewed recommended dietary changes: avoid fad diets, make small/incremental dietary and exercise changes, eat at the table and avoid eating in front of the TV, plan management of cravings, monitor snacking and cravings in food diary Congratulated patient on good medication taking behavior, ongoing weight loss  and 30-40 minutes of Nu Step home exercise each evening Reviewed [place] benefit related to assisting with weight loss Encouraged patient to reschedule with [name] for medical nutrition therapy  Patient Goals/Self-Care Activities: Take medications as prescribed   Attend all scheduled provider appointments Call pharmacy for medication refills 3-7 days in advance of running out of medications Perform all self care activities independently  Perform IADL's (shopping, preparing meals, housekeeping, managing finances) independently Call provider office for new concerns or questions  Call and schedule dentist appointment with [name] for check up and cleaning as soon as possible , phone number [phone] Reschedule a follow up appointment with [name], dietician and CDCES, to help  with ongoing weight loss

## 2021-10-21 NOTE — Chronic Care Management (AMB) (Signed)
Chief Complaint  Patient presents with   Diabetes   Hyperlipidemia   Hypertension    [name] is a 51 y.o. year old male who presented for a telephone visit.   They were referred to the pharmacist by their High Risk Managed Medicaid Care Team  for assistance in managing diabetes, hypertension, and hyperlipidemia.   Patient is participating in a Managed Medicaid Plan:  Yes  Subjective:  Care Team: Primary Care Provider: [name], FNP ; Next Scheduled Visit: 01/01/22 Cardiologist: Harl Bowie; Next Scheduled Visit: 11/05/21 Nephrology: Theador Hawthorne; Next Scheduled Visit: 11/13/21  Medication Access/Adherence  Current Pharmacy:  [facility] [address] [place] [address] Phone: [phone] Fax: [phone]  [facility] [id number] [place], [place] - [address] [place] [address] Phone: [phone] Fax: [phone]   Patient reports affordability concerns with their medications: No  Patient reports access/transportation concerns to their pharmacy: No  Patient reports adherence concerns with their medications:  No     Diabetes:  Current medications: Ozempic 2 mg weekly, Farxiga 5 mg daily  Denies any intolerability concerns since nephrology added Farxiga   Current medication access support: none needed Hypertension:  Current medications: losartan 25 mg daily  Hyperlipidemia/ASCVD Risk Reduction  Current lipid lowering medications: atorvastatin 40 mg daily   Health Maintenance  There are no preventive care reminders to display for this patient.   Objective: Lab Results  Component Value Date   HGBA1C 6.1 (H) 10/17/2021    Lab Results  Component Value Date   CREATININE 1.69 (H) 10/17/2021   BUN 27 (H) 10/17/2021   NA 139 10/17/2021   K 4.4 10/17/2021   CL 100 10/17/2021   CO2 23 10/17/2021    Lab Results  Component Value Date   CHOL 133 10/17/2021    HDL 42 10/17/2021   LDLCALC 62 10/17/2021   TRIG 174 (H) 10/17/2021   CHOLHDL 3.2 10/17/2021    Medications Reviewed Today     Reviewed by Osker Mason, RPH-CPP (Pharmacist) on 10/21/21 at [place] List Status: <None>   Medication Order Taking? Sig Documenting Provider Last Dose Status Informant  atorvastatin (LIPITOR) 40 MG tablet [id number] Yes Take 1 tablet (40 mg total) by mouth daily. [name], NP Taking Active            Med Note [name], [place] Aug 19, 2021  9:00 AM)    blood glucose meter kit and supplies KIT [id number] Yes Test daily Dx  r73.03 [name], DO Taking Active Self  Blood Pressure Monitor KIT [id number] Yes 1 each by Does not apply route daily. [name], NP Taking Active   cyclobenzaprine (FLEXERIL) 10 MG tablet [id number] Yes TAKE 1 TABLET BY MOUTH 2 TIMES A DAY AS NEEDED FOR MUSCLE SPASMS. [name], FNP Taking Active   FARXIGA 5 MG TABS tablet [id number] Yes Take 5 mg by mouth every morning. [provider] Taking Active   gabapentin (NEURONTIN) 300 MG capsule [id number] Yes Take 1 capsule (300 mg total) by mouth at bedtime. [name], FNP Taking Active   glucose blood test strip [id number] Yes Use as instructed. Can check up to 4 times daily. [name], NP Taking Active   latanoprost (XALATAN) 0.005 % ophthalmic solution [id number] Yes Place 1 drop into both eyes at bedtime. [provider] Taking Active Self  losartan (COZAAR) 25 MG tablet [id number] Yes Take 1 tablet (25 mg total) by mouth daily. [name], MD Taking Active   Multiple Vitamin (MULTIVITAMIN) tablet [id number] Yes Take 1 tablet by mouth daily. [provider] Taking Active   NARCAN 4 MG/0.1ML LIQD nasal spray kit [id number] No Place 0.4 mg into the nose once.  Patient not taking: Reported on 10/21/2021   [provider] Not Taking Active Self           Med Note ([place] Aug 19, 2021  9:06  AM)    Oxycodone HCl 20 MG TABS [id number] Yes Take 20 mg by mouth every 6 (six) hours as needed (pain). [provider] Taking Active Self           Med Note [name] Jan 07, 2021 10:36 AM) [name] prn  Semaglutide, 2 MG/DOSE, 8 MG/3ML SOPN [id number] Yes Inject 2 mg as directed once a week. [name], NP Taking Active   sildenafil (VIAGRA) 50 MG tablet [id number] Yes Take 1 tablet (50 mg total) by mouth daily as needed for erectile dysfunction. [name], FNP Taking Active   timolol (BETIMOL) 0.5 % ophthalmic solution [id number] Yes Place 1 drop into both eyes in the morning. [provider] Taking Active Self  vitamin B-12 (CYANOCOBALAMIN) 1000 MCG tablet [id number] Yes Take 1,000 mcg by mouth daily. [provider] Taking Active             SDOH Interventions    Flowsheet Row Most Recent Value  SDOH Interventions   Financial Strain Interventions Intervention Not Indicated       Assessment/Plan:  Care Plan : Medication Management  Updates made by Kaylyn Layer T, RPH-CPP since 10/21/2021 12:00 AM  Completed 10/21/2021   Problem: Diabetes Resolved 10/21/2021     Long-Range Goal: A1c <7% Completed 10/21/2021  Note:   Current Barriers:  Suboptimal therapeutic regimen for diabetes  Patient Needs: Therapy optimization  Patient Activities: Patient will:  - take medications as prescribed as evidenced by patient report and record review check glucose twice daily, document, and provide at future appointments     Problem: Hyperlipidemia and ASCVD Resolved 10/21/2021     Long-Range Goal: LDL <70 Completed 10/21/2021  Note:   Current Barriers:  Suboptimal therapeutic regimen for lipid management  Patient Needs: Therapy optimization  Patient Activities: Patient will:  - take medications as prescribed as evidenced by patient report and record review      Diabetes: - Currently controlled - Praised for attainment of  goal A1c. Noted improvement in proteinuria as well - Discussed benefit of SGLT2. Encouraged to continue current regimen at this time   Hypertension: - Currently controlled - Reviewed long term cardiovascular and renal outcomes of uncontrolled blood pressure - Reviewed appropriate blood pressure monitoring technique and reviewed goal blood pressure. Recommended to check home blood pressure and heart rate periodically - Recommend to continue current regimen at this time    Hyperlipidemia/ASCVD Risk Reduction: - Currently controlled.  - Reviewed goal LDL for secondary prevention. Praised for adherence.  - Recommend to continue current regimen at this time  Health Maintenance: - Patient is up to date on relevant screenings and immunizations, or has declined.   Follow Up Plan: pharmacy needs met. Continue to follow up with RN CM as needed.   Catie Hedwig Morton, PharmD, [facility] [phone]

## 2021-10-22 ENCOUNTER — Other Ambulatory Visit: Payer: Self-pay

## 2021-10-22 DIAGNOSIS — M160 Bilateral primary osteoarthritis of hip: Secondary | ICD-10-CM

## 2021-10-31 DIAGNOSIS — M25551 Pain in right hip: Secondary | ICD-10-CM | POA: Diagnosis not present

## 2021-11-05 ENCOUNTER — Ambulatory Visit: Payer: Medicaid Other | Admitting: Cardiology

## 2021-11-05 ENCOUNTER — Encounter: Payer: Self-pay | Admitting: Cardiology

## 2021-11-05 VITALS — BP 106/80 | HR 88 | Ht 68.0 in | Wt 262.6 lb

## 2021-11-05 DIAGNOSIS — R9439 Abnormal result of other cardiovascular function study: Secondary | ICD-10-CM | POA: Diagnosis not present

## 2021-11-05 DIAGNOSIS — R9431 Abnormal electrocardiogram [ECG] [EKG]: Secondary | ICD-10-CM

## 2021-11-05 NOTE — Progress Notes (Signed)
Clinical Summary Mr. Paulsen is a 51 y.o.male seen today for follow up of the following medical problems.   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 Hyperlipidemia - TC 133 TG 174 HDL 42 LDL 62  4. CKD - on farxiga.  - followed by Dr Theador Hawthorne    Past Medical History:  Diagnosis Date   ALLERGY 08/03/2008   Qualifier: Diagnosis of  By: Christ Kick     Anemia    Arthritis    "hips" (01/15/2015)   Blood transfusion without reported diagnosis    Cellulitis 07/25/2018   Chronic back pain greater than 3 months duration 02/11/2017   Chronic lower back pain    Colon polyps    COLONIC POLYPS, ADENOMATOUS 08/03/2008   Qualifier: Diagnosis of  By: Christ Kick     CVA 08/03/2008   Qualifier: History of  By: Christ Kick     Depression    denies   Diabetes mellitus    diet controlled- no meds since wt loss   Diabetes mellitus ([place]) 07/10/2019   Dysrhythmia    s/p surgery bariatric- cardioversion   Erectile dysfunction 01/26/2018   GERD (gastroesophageal reflux disease)    HIATAL HERNIA 08/03/2008   Qualifier: Diagnosis of  By: Christ Kick     Hip pain    History of gout    Hyperlipidemia    Hypertension    Joint pain    Narcotic dependence ([place]) 02/11/2017   Neuromuscular disorder ([place])    PONV (postoperative nausea and vomiting)    Pre-diabetes    Prediabetes 03/29/2017   Sleep apnea    no longer have to use cpap since wt loss   Stage 3 chronic kidney disease ([place]) 01/26/2018   Stroke ([place]) 2006   denies residual on 01/15/2015   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 ([place]) 08/01/2018     No Known Allergies   Current Outpatient Medications  Medication Sig  Dispense Refill   atorvastatin (LIPITOR) 40 MG tablet Take 1 tablet (40 mg total) by mouth daily. 90 tablet 3   blood glucose meter kit and supplies KIT Test daily Dx  r73.03 1 each 0   Blood Pressure Monitor KIT 1 each by Does not apply route daily. 1 kit 0   cyclobenzaprine (FLEXERIL) 10 MG tablet TAKE 1 TABLET BY MOUTH 2 TIMES A DAY AS NEEDED FOR MUSCLE SPASMS. 60 tablet 0   FARXIGA 5 MG TABS tablet Take 5 mg by mouth every morning.     gabapentin (NEURONTIN) 300 MG capsule Take 1 capsule (300 mg total) by mouth at bedtime. 90 capsule 0   glucose blood test strip Use as instructed. Can check up to 4 times daily. 100 each 3   latanoprost (XALATAN) 0.005 % ophthalmic solution Place 1 drop into both eyes at bedtime.     losartan (COZAAR) 25 MG tablet Take 1 tablet (25 mg total) by mouth daily. 90 tablet 0   Multiple Vitamin (MULTIVITAMIN) tablet Take 1 tablet by mouth daily.     NARCAN 4 MG/0.1ML LIQD nasal spray kit Place 0.4 mg into the nose once. (Patient not taking: Reported on 10/21/2021)  Oxycodone HCl 20 MG TABS Take 20 mg by mouth every 6 (six) hours as needed (pain).     Semaglutide, 2 MG/DOSE, 8 MG/3ML SOPN Inject 2 mg as directed once a week. 3 mL 11   sildenafil (VIAGRA) 50 MG tablet Take 1 tablet (50 mg total) by mouth daily as needed for erectile dysfunction. 20 tablet 0   timolol (BETIMOL) 0.5 % ophthalmic solution Place 1 drop into both eyes in the morning.     vitamin B-12 (CYANOCOBALAMIN) 1000 MCG tablet Take 1,000 mcg by mouth daily.     No current facility-administered medications for this visit.     Past Surgical History:  Procedure Laterality Date   BARIATRIC SURGERY  2010   in [place]  02/26/2021   Procedure: BIOPSY;  Surgeon: Harvel Quale, MD;  [facility];  Service: Gastroenterology;;   CARDIOVERSION     COLONOSCOPY N/A 08/02/2014   Procedure: COLONOSCOPY;  Surgeon: Rogene Houston, MD;  [facility];  Service:  Endoscopy;  Laterality: N/A;  210 - moved to 3/10 @ 11:55 - Ann notified pt   COLONOSCOPY WITH PROPOFOL N/A 02/26/2021   Procedure: COLONOSCOPY WITH PROPOFOL;  Surgeon: Harvel Quale, MD;  [facility];  Service: Gastroenterology;  Laterality: N/A;  10:00   ESOPHAGOGASTRODUODENOSCOPY     ESOPHAGOGASTRODUODENOSCOPY (EGD) WITH PROPOFOL N/A 02/26/2021   Procedure: ESOPHAGOGASTRODUODENOSCOPY (EGD) WITH PROPOFOL;  Surgeon: Harvel Quale, MD;  [facility];  Service: Gastroenterology;  Laterality: N/A;   HERNIA REPAIR     LIPOSUCTION     PANNICULECTOMY  01/14/2015   PANNICULECTOMY N/A 01/14/2015   Procedure:  TOTAL PANNICULECTOMY WTH LIPOSUCTION OF SIDES;  Surgeon: Cristine Polio, MD;  Location: [place];  Service: Plastics;  Laterality: N/A;   POLYPECTOMY  02/26/2021   Procedure: POLYPECTOMY INTESTINAL;  Surgeon: Harvel Quale, MD;  [facility];  Service: Gastroenterology;;   TONSILLECTOMY     VENTRAL HERNIA REPAIR  01/14/2015   VENTRAL HERNIA REPAIR N/A 01/14/2015   Procedure: HERNIA REPAIR VENTRAL ADULT AND MUSCLE REPAIR;  Surgeon: Cristine Polio, MD;  Location: [place];  Service: Plastics;  Laterality: N/A;     No Known Allergies    Family History  Problem Relation Age of Onset   COPD Mother    Depression Mother    Hyperlipidemia Mother    Hypertension Mother    Heart disease Mother    Thyroid disease Mother    Anxiety disorder Mother    Diabetes Father    Pulmonary embolism Father 60       blood clot   Hyperlipidemia Father    Sudden death Father    Obesity Father    Eating disorder Father    Hypertension Brother      Social History Mr. Christiano reports that he has never smoked. He has never used smokeless tobacco. Mr. Bretado reports no history of alcohol use.   Review of Systems CONSTITUTIONAL: No weight loss, fever, chills, weakness or fatigue.  HEENT: Eyes: No visual loss, blurred vision, double vision  or yellow sclerae.No hearing loss, sneezing, congestion, runny nose or sore throat.  SKIN: No rash or itching.  CARDIOVASCULAR: per hpi RESPIRATORY: No shortness of breath, cough or sputum.  GASTROINTESTINAL: No anorexia, nausea, vomiting or diarrhea. No abdominal pain or blood.  GENITOURINARY: No burning on urination, no polyuria NEUROLOGICAL: No headache, dizziness, syncope, paralysis, ataxia, numbness or tingling in the extremities. No change in bowel  or bladder control.  MUSCULOSKELETAL: No muscle, back pain, joint pain or stiffness.  LYMPHATICS: No enlarged nodes. No history of splenectomy.  PSYCHIATRIC: No history of depression or anxiety.  ENDOCRINOLOGIC: No reports of sweating, cold or heat intolerance. No polyuria or polydipsia.  Marland Kitchen   Physical Examination Today's Vitals   11/05/21 1007  BP: 106/80  Pulse: 88  SpO2: 98%  Weight: 262 lb 9.6 oz (119.1 kg)  Height: '5\' 8"'  (1.727 m)   Body mass index is 39.93 kg/m.  Gen: resting comfortably, no acute distress HEENT: no scleral icterus, pupils equal round and reactive, no palptable cervical adenopathy,  CV: RRR, no m/r/g no jvd Resp: Clear to auscultation bilaterally GI: abdomen is soft, non-tender, non-distended, normal bowel sounds, no hepatosplenomegaly MSK: extremities are warm, no edema.  Skin: warm, no rash Neuro:  no focal deficits Psych: appropriate affect   Diagnostic Studies 06/2021 nuclear stress  Findings are consistent with ischemia. The study is low risk.   No ST deviation was noted.   LV perfusion is abnormal. There is evidence of ischemia. There is no evidence of infarction. Defect 1: There is a medium defect with mild reduction in uptake present in the inferoseptal location(s) that is reversible.   Left ventricular function is normal. End diastolic cavity size is normal. End systolic cavity size is normal.   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 Abnormal stress test - low risk stress test, suggets small area of ischemia overall low risk - no symptoms - continue risk factor modication - he remains cleared to proceed with orthopedic surgery - would add ASA 28m daily,, already on statin   3. Hyperlipidemia - at goal, continue current meds         JArnoldo [name] M.D.

## 2021-11-05 NOTE — Patient Instructions (Signed)
Medication Instructions:  Your physician has recommended you make the following change in your medication:  Start aspirin 81 mg once a day Continue all other medications as directed  Labwork: none  Testing/Procedures: none  Follow-Up: Your physician recommends that you schedule a follow-up appointment in: 1 year  Any Other Special Instructions Will Be Listed Below (If Applicable).  You will receive a reminder call in about 10 months reminding you to schedule your appointment. If you don't receive this call, please contact our office.  If you need a refill on your cardiac medications before your next appointment, please call your pharmacy.

## 2021-11-06 DIAGNOSIS — M160 Bilateral primary osteoarthritis of hip: Secondary | ICD-10-CM | POA: Diagnosis not present

## 2021-11-06 DIAGNOSIS — Z0130 Encounter for examination of blood pressure without abnormal findings: Secondary | ICD-10-CM | POA: Diagnosis not present

## 2021-11-06 DIAGNOSIS — Z6838 Body mass index (BMI) 38.0-38.9, adult: Secondary | ICD-10-CM | POA: Diagnosis not present

## 2021-11-06 DIAGNOSIS — M549 Dorsalgia, unspecified: Secondary | ICD-10-CM | POA: Diagnosis not present

## 2021-11-06 DIAGNOSIS — E1122 Type 2 diabetes mellitus with diabetic chronic kidney disease: Secondary | ICD-10-CM | POA: Diagnosis not present

## 2021-11-06 DIAGNOSIS — G8929 Other chronic pain: Secondary | ICD-10-CM | POA: Diagnosis not present

## 2021-11-06 DIAGNOSIS — E1121 Type 2 diabetes mellitus with diabetic nephropathy: Secondary | ICD-10-CM | POA: Diagnosis not present

## 2021-11-06 DIAGNOSIS — N183 Chronic kidney disease, stage 3 unspecified: Secondary | ICD-10-CM | POA: Diagnosis not present

## 2021-11-06 DIAGNOSIS — E559 Vitamin D deficiency, unspecified: Secondary | ICD-10-CM | POA: Diagnosis not present

## 2021-11-06 DIAGNOSIS — Z79899 Other long term (current) drug therapy: Secondary | ICD-10-CM | POA: Diagnosis not present

## 2021-11-06 DIAGNOSIS — M5136 Other intervertebral disc degeneration, lumbar region: Secondary | ICD-10-CM | POA: Diagnosis not present

## 2021-11-07 DIAGNOSIS — E1122 Type 2 diabetes mellitus with diabetic chronic kidney disease: Secondary | ICD-10-CM | POA: Diagnosis not present

## 2021-11-07 DIAGNOSIS — N189 Chronic kidney disease, unspecified: Secondary | ICD-10-CM | POA: Diagnosis not present

## 2021-11-07 DIAGNOSIS — I5032 Chronic diastolic (congestive) heart failure: Secondary | ICD-10-CM | POA: Diagnosis not present

## 2021-11-07 DIAGNOSIS — I129 Hypertensive chronic kidney disease with stage 1 through stage 4 chronic kidney disease, or unspecified chronic kidney disease: Secondary | ICD-10-CM | POA: Diagnosis not present

## 2021-11-07 DIAGNOSIS — R809 Proteinuria, unspecified: Secondary | ICD-10-CM | POA: Diagnosis not present

## 2021-11-07 DIAGNOSIS — E1129 Type 2 diabetes mellitus with other diabetic kidney complication: Secondary | ICD-10-CM | POA: Diagnosis not present

## 2021-11-13 DIAGNOSIS — I129 Hypertensive chronic kidney disease with stage 1 through stage 4 chronic kidney disease, or unspecified chronic kidney disease: Secondary | ICD-10-CM | POA: Diagnosis not present

## 2021-11-13 DIAGNOSIS — M129 Arthropathy, unspecified: Secondary | ICD-10-CM | POA: Diagnosis not present

## 2021-11-13 DIAGNOSIS — Z6839 Body mass index (BMI) 39.0-39.9, adult: Secondary | ICD-10-CM | POA: Diagnosis not present

## 2021-11-13 DIAGNOSIS — I5032 Chronic diastolic (congestive) heart failure: Secondary | ICD-10-CM | POA: Diagnosis not present

## 2021-11-13 DIAGNOSIS — E1122 Type 2 diabetes mellitus with diabetic chronic kidney disease: Secondary | ICD-10-CM | POA: Diagnosis not present

## 2021-11-13 DIAGNOSIS — N189 Chronic kidney disease, unspecified: Secondary | ICD-10-CM | POA: Diagnosis not present

## 2021-11-13 DIAGNOSIS — E1129 Type 2 diabetes mellitus with other diabetic kidney complication: Secondary | ICD-10-CM | POA: Diagnosis not present

## 2021-11-13 DIAGNOSIS — R809 Proteinuria, unspecified: Secondary | ICD-10-CM | POA: Diagnosis not present

## 2021-11-13 DIAGNOSIS — R945 Abnormal results of liver function studies: Secondary | ICD-10-CM | POA: Diagnosis not present

## 2021-11-17 ENCOUNTER — Other Ambulatory Visit: Payer: Self-pay | Admitting: Nurse Practitioner

## 2021-11-18 NOTE — Telephone Encounter (Signed)
Called pt no answer left vm 

## 2021-11-18 NOTE — Telephone Encounter (Signed)
Please advise ok to refill?  

## 2021-11-19 ENCOUNTER — Telehealth: Payer: Self-pay

## 2021-11-19 NOTE — Telephone Encounter (Signed)
Spoke with pt

## 2021-11-19 NOTE — Telephone Encounter (Signed)
Patient returning a call from our office and did not leave no detail message. Please return call to the patient.

## 2021-11-19 NOTE — Telephone Encounter (Signed)
Spoke with pt advised of fola's message pt verbalized understanding ?

## 2021-11-21 ENCOUNTER — Other Ambulatory Visit: Payer: Self-pay | Admitting: Obstetrics and Gynecology

## 2021-11-21 NOTE — Patient Instructions (Signed)
Hi Mr. Cordts, thanks for speaking with me-have a great weekend!!  Mr. Rowlette was given information about Medicaid Managed Care team care coordination services as a part of their [facility] Medicaid benefit. [name] verbally consented to engagement with the [facility] Managed Care team.   If you are experiencing a medical emergency, please call 911 or report to your local emergency department or urgent care.   If you have a non-emergency medical problem during routine business hours, please contact your provider's office and ask to speak with a nurse.   For questions related to your [facility] health plan, please call: [phone] or visit the homepage here: [email or web address]  If you would like to schedule transportation through your [facility] plan, please call the following number at least 2 days in advance of your appointment: [phone]  For information about your ride after you set it up, call Ride Assist at [phone]. Use this number to activate a Will Call pickup, or if your transportation is late for a scheduled pickup. Use this number, too, if you need to make a change or cancel a previously scheduled reservation.  If you need transportation services right away, call [phone]. The after-hours call center is staffed 24 hours to handle ride assistance and urgent reservation requests (including discharges) 365 days a year. Urgent trips include sick visits, hospital discharge requests and life-sustaining treatment.  Call the [place] at [phone], at any time, 24 hours a day, 7 days a week. If you are in danger or need immediate medical attention call 911.  If you would like help to quit smoking, call 1-800-QUIT-NOW 347-099-1664) OR Espaol: 1-855-Djelo-Ya (2-542-706-2376) o para ms informacin haga clic aqu or Text READY to 200-400 to register via text  Mr. Fullington - following are the  goals we discussed in your visit today:   Goals Addressed    Long-Range Goal: Establish Plan of Care for Chronic Disease Management Needs   Start Date: 10/21/2021  Expected End Date: 01/21/2022  Priority: High  Note:   Timeframe:  Long-Range Goal Priority:  High Start Date:     10/21/21                        Expected End Date:  ongoing                     Follow Up Date 12/30/21   - schedule appointment for vaccines needed due to my age or health - schedule recommended health tests (blood work, mammogram, colonoscopy, pap test) - schedule and keep appointment for annual check-up    Why is this important?   Screening tests can find diseases early when they are easier to treat.  Your doctor or nurse will talk with you about which tests are important for you.  Getting shots for common diseases like the flu and shingles will help prevent them.  11/21/21:  Patient seen and evaluated by Nephrologist 11/13/21.  Seen and evaluated by pain management last week.  Upcoming hip replacement surgery 12/16/21    Patient verbalizes understanding of instructions and care plan provided today and agrees to view in [place]. Active MyChart status and patient understanding of how to access instructions and care plan via MyChart confirmed with patient.     The Managed Medicaid care management team will reach out to the patient again over the next 30 business  days.  The  Patient  has been provided with  contact information for the Managed Medicaid care management team and has been advised to call with any health related questions or concerns.   [name] RN, BSN [place] Management Coordinator - Managed Medicaid High Risk [phone]   Following is a copy of your plan of care:  Care Plan : Elizabeth  Updates made by Gayla Medicus, RN since 11/21/2021 12:00 AM     Problem: Chronic Disease Management and Care Coordination Needs for DM, HTN, HLD, CKD       Long-Range Goal: Development of Plan Of Care For Chronic Disease Management and Care Coordination Needs to Assist With Meeting Treatment Goals for DM, CKD, HTN, obesity, chronic hip pain   Start Date: 01/07/2021  Expected End Date: 01/07/2022  Priority: High  Note:   Current Barriers:  Knowledge Deficits related to plan of care for management of HTN, HLD, CKD, and DMII, chronic pain, obesity- patient states he continues to receive steroid shots in hips alternating each month and requires oxycodone to manage pain, says he will call to schedule his next injection in his right hip, reports insurance has approved bilateral hip replacement surgery and he is doing his best to get his weight to 255 lbs per requirement from his orthopedic surgeon, says the Ozempic is helping him lose weight and have less desire for sweets, states he weighs weekly on Wednesday. Patient awaiting CPAP-sleep study completed.  Patient's most recent A1C=6.1 on 10/17/21.Checks blood pressure occasionally and is fine per patient.  Patient states he must find a new pain management provider as Dr. [name] no longer accepts [facility] Chronic Disease Management support and education needs related to HTN, HLD, and DMII, chronic pain 11/21/21:  Patient scheduled to have hip replacement surgery 12/16/21.  Has a new pain management provider-Dr. Lynetta Mare at [facility] appt is 7/19.  BP and CBG WNL.    RNCM Clinical Goal(s):  Patient will verbalize understanding of plan for management of HTN, HLD, CKD and DMII take all medications exactly as prescribed and will call provider for medication related questions attend all scheduled medical appointments:  demonstrate ongoing adherence to prescribed treatment plan for HTN, HLD, CKD and DMII as evidenced by daily/ twice daily monitoring and recording of CBG,  adherence to ADA/ carb modified, fat modified, no added salt diet, continue 30-40 minutes of exercise on your  NuStep 5-7 days/week, adherence to prescribed medication regimen including new diabetes medication,  contacting provider for new or worsened symptoms or questions related to HTN, DM or HLD, CKD continue to work with Consulting civil engineer and managed Medicaid pharmacist to address care management and care coordination needs related to HTN, HLD, and DMII , CKD  Interventions: Inter-disciplinary care team collaboration (see longitudinal plan of care) Evaluation of current treatment plan related to  self management and patient's adherence to plan as established by provider Reviewed appointments and insured patient has transportation to all appointments   Chronic Kidney Disease (Status: Goal on Track (progressing): YES.) - all recent provider BP readings are meeting treatment targets, patient reports his nephrologist was pleased that his creatinine has stabilized and that his BP reading during office visit was good ,  patient states he checks his BP occasionally and that readings are usually <130/<80,  120/80 at most recent PCP visit, he says provider office readings are higher at times because he is in pain from the walk to the offices. Nephrologist increased losartan to  25 mg daily on 04/02/21 due to nonnephrotic range proteinuria, and added Farxiga 5 mg to patient's medication regimen on 09/03/21, he will see his nephrologist again on 11/13/21 Last practice recorded BP readings:    BP Readings from Last 3 Encounters:  08/01/21 107/76  06/26/21 110/76  06/19/21 116/76         Most recent eGFR/CrCl:  Lab Results  Component Value Date   NA 137 05/15/2021   K 4.1 05/15/2021   CREATININE 1.63 (H) 05/15/2021   EGFR 51 (L) 05/15/2021   GFRNONAA 37 (L) 01/10/2020   GLUCOSE 128 (H) 05/15/2021    Reviewed medications with patient and discussed importance of compliance   Assessed frequency of self monitored BP, reviewed home and provider office readings, reviewed treatment targets and reviewed lifestyle  strategies   Reviewed addition of Farxiga to medication regimen for blood pressure, blood sugar and weight control in addition to slowing progression of CKD and lowering risks of heart attack, stroke and cardiac death , assessed tolerance of Farxiga and reviewed major adverse side effects   Reviewed scheduled/upcoming provider appointments  Discussed plans with patient for ongoing care management follow up and provided patient with direct contact information for care management team    Diabetes:  (Status: Goal on track: YES.)- patient reports self monitored fasting CBGs are usually <130, checks 1-2 times a day.  Most recent A1C=6.1 on 10/17/21. Lab Results  Component Value Date   HGBA1C 7.0 (H) 05/15/2021  Assessed frequency of CBG testing and reviewed fasting target Reviewed medications, including recent addition of Farxiga to medication regimen with patient and discussed importance of good medication taking behavior Discussed plans with patient for ongoing care management follow up and provided patient with direct contact information for care management team;               Health Maintenance (Status: Condition stable. Not addressed this visit.) - patient completed colonoscopy and endoscopy on 02/26/21 and was aware of pathology report- non cancerous Patient interviewed about adult health maintenance status   Pneumonia Vaccine-  received pneumococcal  vaccine on 05/15/21 Influenza Vaccine- states he received flu shot on 02/06/21 COVID vaccination   - pt states he never received  Shingles Vaccination- received first dose on 02/06/21, did not receive 2nd vaccination so will message provider prior to his appointment on May 11,2023-second shingles vaccine received 10/17/21 Regular eye checkups- says he sees his eye doctor, Dr [name],  every 6 months due to glaucoma and his next appointment is in November  Advanced Directives- states he does not have and does not want information at this time  Dental Care-  states his dentist is Dr Mina Marble in [place] and he sees her once-twice yearly but he is currently overdue for check up and cleaning, emphasized importance of regular dental care especially with his diagnosis of DM      Hyperlipidemia:  (Status: Condition stable. Not addressed this visit. Lab Results  Component Value Date   CHOL 162 05/15/2021   HDL 48 05/15/2021   LDLCALC 88 05/15/2021   TRIG 147 05/15/2021   CHOLHDL 6.1 (H) 12/20/2019     Medication review performed; medication list updated in electronic medical record.   Hypertension: (Status: Goal on Track (progressing): YES.)-patient states he checks his BP occasionally and that readings are usually <130/<80, he says provider office readings are higher at times because he is in pain from the walk to the offices. Nephrologist increased losartan to 25 mg daily on 04/02/21 due  to nonnephrotic range proteinuria Last practice recorded BP readings:  BP Readings from Last 3 Encounters:  08/01/21 107/76  06/26/21 110/76  06/19/21 116/76    Most recent eGFR/CrCl:  Lab Results  Component Value Date   NA 137 05/15/2021   K 4.1 05/15/2021   CREATININE 1.63 (H) 05/15/2021   EGFR 51 (L) 05/15/2021   GFRNONAA 37 (L) 01/10/2020   GLUCOSE 128 (H) 05/15/2021   Evaluation of current treatment plan related to hypertension self management and patient's adherence to plan as established by provider;   Reviewed BP medications and assessed medication taking behavior ensuring he is taking Losartan that was recently added to his regimen Assessed frequency of self monitored blood press and readings, reviewed treatment targets Discussed plans with patient for ongoing care management follow up and provided patient with direct contact information for care management team; Reviewed scheduled/upcoming provider appointments  Pain:  (Status: Goal on track: NO.)patient states bilateral hip pain continues and requires regular injections and oxycodone to control,  says he must find a new pain management provider as Dr. Crisp no longer accepts [employer] [place] Man[employer] Medicaid. Medications reviewed Reviewed provider established plan for pain management; Counseled on the importance of reporting any/all new or changed pain symptoms or management strategies to pain management provider; Positive reinforcement given to patient regarding ongoing weight loss to meet goal to qualify for bilateral hip surgery  Weight Loss Interventions:  (Status:  New goal.) Long Term Goal- pt and gastric bypass in 2010 in [place], currently meeting with RD, CDCES Penny Crumpton to assist with meeting goal weight of 255 lbs or less so that he may proceed with bilateral hip surgery, patient says he only drinks water but struggles with concentrated sweet consumption although the Ozempic has also helped with his CHO cravings, unfortunately patient canceled follow up appointment with dietician on 08/19/21 Provided verbal and/or written education to patient re: provider recommended life style modifications  Reviewed recommended dietary changes: avoid fad diets, make small/incremental dietary and exercise changes, eat at the table and avoid eating in front of the TV, plan management of cravings, monitor snacking and cravings in food diary Congratulated patient on good medication taking behavior, ongoing weight loss  and 30-40 minutes of Nu Step home exercise each evening Reviewed Farxiga benefit related to assisting with weight loss Encouraged patient to reschedule with Penny Crumpton for medical nutrition therapy  Patient Goals/Self-Care Activities: Take medications as prescribed   Attend all scheduled provider appointments Call pharmacy for medication refills 3-7 days in advance of running out of medications Perform all self care activities independently  Perform IADL's (shopping, preparing meals, housekeeping, managing finances) independently Call provider office for new concerns or  questions  Call and schedule dentist appointment with Dr. Wang for check up and cleaning as soon as possible , phone number [phone] Reschedule a follow up appointment with Penny Crumpton, dietician and CDCES, to help with ongoing weight loss Patient to follow up on CPAP      

## 2021-11-21 NOTE — Patient Outreach (Signed)
Medicaid Managed Care   Nurse Care Manager Note  11/21/2021 Name:  [name] MRN:  [id number] DOB:  1971/04/04  [name] is an 51 y.o. year old male who is a primary patient of [name], FNP.  [facility] Managed Care Coordination team was consulted for assistance with:    Chronic healthcare management needs, HTN, DM, chronic pain, CKD, OSA, glaucoma, osteoarthritis, HLD  Mr. Wiltsey was given information about Medicaid Managed Care Coordination team services today. [name] Patient agreed to services and verbal consent obtained.  Engaged with patient by telephone for follow up visit in response to provider referral for case management and/or care coordination services.   Assessments/Interventions:  Review of past medical history, allergies, medications, health status, including review of consultants reports, laboratory and other test data, was performed as part of comprehensive evaluation and provision of chronic care management services.  SDOH (Social Determinants of Health) assessments and interventions performed: SDOH Interventions    Flowsheet Row Most Recent Value  SDOH Interventions   Food Insecurity Interventions Intervention Not Indicated     Care Plan  No Known Allergies  Medications Reviewed Today     Reviewed by Gayla Medicus, RN (Registered Nurse) on 11/21/21 at 1511  Med List Status: <None>   Medication Order Taking? Sig Documenting Provider Last Dose Status Informant  atorvastatin (LIPITOR) 40 MG tablet [id number] No Take 1 tablet (40 mg total) by mouth daily. [name], NP Taking Active            Med Note [name], [place] Aug 19, 2021  9:00 AM)    blood glucose meter kit and supplies KIT [id number] No Test daily Dx  r73.03 [name], DO Taking Active Self  Blood Pressure Monitor KIT [id number] No 1 each by Does not apply route daily. [name], NP Taking Active   cyclobenzaprine (FLEXERIL) 10 MG tablet [id number]  No TAKE 1 TABLET BY MOUTH 2 TIMES A DAY AS NEEDED FOR MUSCLE SPASMS. [name], FNP Taking Active   FARXIGA 5 MG TABS tablet [id number] No Take 5 mg by mouth every morning. [provider] Taking Active   gabapentin (NEURONTIN) 300 MG capsule [id number] No Take 1 capsule (300 mg total) by mouth at bedtime. [name], FNP Taking Active   glucose blood test strip [id number] No Use as instructed. Can check up to 4 times daily. [name], NP Taking Active   latanoprost (XALATAN) 0.005 % ophthalmic solution [id number] No Place 1 drop into both eyes at bedtime. [provider] Taking Active Self  losartan (COZAAR) 25 MG tablet [id number] No Take 1 tablet (25 mg total) by mouth daily. [name], MD Taking Active   Multiple Vitamin (MULTIVITAMIN) tablet [id number] No Take 1 tablet by mouth daily. [provider] Taking Active   NARCAN 4 MG/0.1ML LIQD nasal spray kit [id number] No Place 0.4 mg into the nose once. [provider] Taking Active Self           Med Note ([place]   Tue Aug 19, 2021  9:06 AM)    Oxycodone HCl 20 MG TABS [id number] No Take 20 mg by mouth every 6 (six) hours as needed (pain). [provider] Taking Active Self           Med Note [name] Jan 07, 2021 10:36 AM) [name] prn  Semaglutide, 2 MG/DOSE, 8 MG/3ML [facility] [id number]  No Inject 2 mg as directed once a week. [name], NP Taking Active   sildenafil (VIAGRA) 50 MG tablet [id number] No Take 1 tablet (50 mg total) by mouth daily as needed for erectile dysfunction. [name], FNP Taking Active   timolol (BETIMOL) 0.5 % ophthalmic solution [id number] No Place 1 drop into both eyes in the morning. [provider] Taking Active Self  vitamin B-12 (CYANOCOBALAMIN) 1000 MCG tablet [id number] No Take 1,000 mcg by mouth daily. [provider] Taking Active            Patient Active Problem List   Diagnosis Date Noted    Need for varicella vaccine 10/16/2021   OSA  09/11/2021   Pre-operative clearance 06/19/2021   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   Immunization due 02/06/2021   Iron deficiency anemia 12/30/2020   History of colonic polyps 12/30/2020   Unilateral primary osteoarthritis, right hip 08/13/2020   Unilateral primary osteoarthritis, left hip 08/13/2020   Muscle spasms of both lower extremities 05/23/2020   Need for immunization against influenza 03/06/2020   Primary osteoarthritis of both hips 07/10/2019   Bilateral primary osteoarthritis of hip 07/10/2019   Vitamin D deficiency 04/13/2019   Morbid obesity with body mass index (BMI) of 40.0 to 44.9 in adult ([place]) 04/13/2019   Depressive disorder 04/13/2019   Body mass index (BMI) 45.0-49.9, adult ([place]) 04/13/2019   Kidney disease, chronic, stage III (GFR 30-59 ml/min) ([place]) 07/20/2017   History of cerebrovascular accident 07/20/2017   Gastroesophageal reflux disease 07/20/2017   Gastric bypass status for obesity 02/11/2017   Benzodiazepine dependence ([place]) 02/11/2017   Type 2 diabetes mellitus with diabetic nephropathy ([place]) 08/03/2008   Hyperlipidemia 08/03/2008   High blood pressure 08/03/2008   SLEEP APNEA 08/03/2008   Sleep apnea 08/03/2008   Conditions to be addressed/monitored per PCP order:  Chronic healthcare management needs, HTN, DM, chronic pain, CKD, OSA, glaucoma, osteoarthritis, HLD  Care Plan : RN Care Manager Plan Of Care  Updates made by Gayla Medicus, RN since 11/21/2021 12:00 AM     Problem: Chronic Disease Management and Care Coordination Needs for DM, HTN, HLD, CKD      Long-Range Goal: Development of Plan Of Care For Chronic Disease Management and Care Coordination Needs to Assist With Meeting Treatment Goals for DM, CKD, HTN, obesity, chronic hip pain   Start Date: 01/07/2021  Expected End Date: 01/07/2022  Priority: High  Note:   Current Barriers:   Knowledge Deficits related to plan of care for management of HTN, HLD, CKD, and DMII, chronic pain, obesity- patient states he continues to receive steroid shots in hips alternating each month and requires oxycodone to manage pain, says he will call to schedule his next injection in his right hip, reports insurance has approved bilateral hip replacement surgery and he is doing his best to get his weight to 255 lbs per requirement from his orthopedic surgeon, says the Ozempic is helping him lose weight and have less desire for sweets, states he weighs weekly on Wednesday. Patient awaiting CPAP-sleep study completed.  Patient's most recent A1C=6.1 on 10/17/21.Checks blood pressure occasionally and is fine per patient.  Patient states he must find a new pain management provider as Dr. [name] no longer accepts [facility] Chronic Disease Management support and education needs related to HTN, HLD, and DMII, chronic pain 11/21/21:  Patient scheduled to have hip replacement surgery 12/16/21.  Has a new  pain management provider-Dr. Lynetta Mare at [facility] appt is 7/19.  BP and CBG WNL.    RNCM Clinical Goal(s):  Patient will verbalize understanding of plan for management of HTN, HLD, CKD and DMII take all medications exactly as prescribed and will call provider for medication related questions attend all scheduled medical appointments:  demonstrate ongoing adherence to prescribed treatment plan for HTN, HLD, CKD and DMII as evidenced by daily/ twice daily monitoring and recording of CBG,  adherence to ADA/ carb modified, fat modified, no added salt diet, continue 30-40 minutes of exercise on your NuStep 5-7 days/week, adherence to prescribed medication regimen including new diabetes medication,  contacting provider for new or worsened symptoms or questions related to HTN, DM or HLD, CKD continue to work with Consulting civil engineer and managed Medicaid pharmacist to address care management and  care coordination needs related to HTN, HLD, and DMII , CKD  Interventions: Inter-disciplinary care team collaboration (see longitudinal plan of care) Evaluation of current treatment plan related to  self management and patient's adherence to plan as established by provider Reviewed appointments and insured patient has transportation to all appointments   Chronic Kidney Disease (Status: Goal on Track (progressing): YES.) - all recent provider BP readings are meeting treatment targets, patient reports his nephrologist was pleased that his creatinine has stabilized and that his BP reading during office visit was good ,  patient states he checks his BP occasionally and that readings are usually <130/<80,  120/80 at most recent PCP visit, he says provider office readings are higher at times because he is in pain from the walk to the offices. 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, and added Farxiga 5 mg to patient's medication regimen on 09/03/21, he will see his nephrologist again on 11/13/21 Last practice recorded BP readings:    BP Readings from Last 3 Encounters:  08/01/21 107/76  06/26/21 110/76  06/19/21 116/76         Most recent eGFR/CrCl:  Lab Results  Component Value Date   NA 137 05/15/2021   K 4.1 05/15/2021   CREATININE 1.63 (H) 05/15/2021   EGFR 51 (L) 05/15/2021   GFRNONAA 37 (L) 01/10/2020   GLUCOSE 128 (H) 05/15/2021    Reviewed medications with patient and discussed importance of compliance   Assessed frequency of self monitored BP, reviewed home and provider office readings, reviewed treatment targets and reviewed lifestyle strategies   Reviewed addition of Farxiga to medication regimen for blood pressure, blood sugar and weight control in addition to slowing progression of CKD and lowering risks of heart attack, stroke and cardiac death , assessed tolerance of Farxiga and reviewed major adverse side effects   Reviewed  scheduled/upcoming provider appointments  Discussed plans with patient for ongoing care management follow up and provided patient with direct contact information for care management team    Diabetes:  (Status: Goal on track: YES.)- patient reports self monitored fasting CBGs are usually <130, checks 1-2 times a day.  Most recent A1C=6.1 on 10/17/21. Lab Results  Component Value Date   HGBA1C 7.0 (H) 05/15/2021  Assessed frequency of CBG testing and reviewed fasting target Reviewed medications, including recent addition of Farxiga to medication regimen with patient and discussed importance of good medication taking behavior Discussed plans with patient for ongoing care management follow up and provided patient with direct contact information for care management team;  Health Maintenance (Status: Condition stable. Not addressed this visit.) - patient completed colonoscopy and endoscopy on 02/26/21 and was aware of pathology report- non cancerous Patient interviewed about adult health maintenance status   Pneumonia Vaccine-  received pneumococcal  vaccine on 05/15/21 Influenza Vaccine- states he received flu shot on 02/06/21 COVID vaccination   - pt states he never received  Shingles Vaccination- received first dose on 02/06/21, did not receive 2nd vaccination so will message provider prior to his appointment on May 11,2023-second shingles vaccine received 10/17/21 Regular eye checkups- says he sees his eye doctor, Dr [name],  every 6 months due to glaucoma and his next appointment is in November  Advanced Directives- states he does not have and does not want information at this time  Dental Care- states his dentist is Dr Mina Marble in [place] and he sees her once-twice yearly but he is currently overdue for check up and cleaning, emphasized importance of regular dental care especially with his diagnosis of DM      Hyperlipidemia:  (Status: Condition stable. Not addressed this visit. Lab  Results  Component Value Date   CHOL 162 05/15/2021   HDL 48 05/15/2021   LDLCALC 88 05/15/2021   TRIG 147 05/15/2021   CHOLHDL 6.1 (H) 12/20/2019     Medication review performed; medication list updated in electronic medical record.   Hypertension: (Status: Goal on Track (progressing): YES.)-patient states he checks his BP occasionally and that readings are usually <130/<80, he says provider office readings are higher at times because he is in pain from the walk to the offices. 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 Last practice recorded BP readings:  BP Readings from Last 3 Encounters:  08/01/21 107/76  06/26/21 110/76  06/19/21 116/76    Most recent eGFR/CrCl:  Lab Results  Component Value Date   NA 137 05/15/2021   K 4.1 05/15/2021   CREATININE 1.63 (H) 05/15/2021   EGFR 51 (L) 05/15/2021   GFRNONAA 37 (L) 01/10/2020   GLUCOSE 128 (H) 05/15/2021   Evaluation of current treatment plan related to hypertension self management and patient's adherence to plan as established by provider;   Reviewed BP medications and assessed medication taking behavior ensuring he is taking Losartan that was recently added to his regimen Assessed frequency of self monitored blood press and readings, reviewed treatment targets Discussed plans with patient for ongoing care management follow up and provided patient with direct contact information for care management team; Reviewed scheduled/upcoming provider appointments  Pain:  (Status: Goal on track: NO.)patient states bilateral hip pain continues and requires regular injections and oxycodone to control, says he must find a new pain management provider as Dr. [name] no longer accepts [facility]. Medications reviewed Reviewed provider established plan for pain management; Counseled on the importance of reporting any/all new or changed pain symptoms or management strategies to  pain management provider; Positive reinforcement given to patient regarding ongoing weight loss to meet goal to qualify for bilateral hip surgery  Weight Loss Interventions:  (Status:  New goal.) Long Term Goal- pt and gastric bypass in 2010 in [place], currently meeting with RD, [facility] [name] to assist with meeting goal weight of 255 lbs or less so that he may proceed with bilateral hip surgery, patient says he only drinks water but struggles with concentrated sweet consumption although the Ozempic has also helped with his CHO cravings, unfortunately patient canceled follow up appointment with dietician on 08/19/21  Provided verbal and/or written education to patient re: provider recommended life style modifications  Reviewed recommended dietary changes: avoid fad diets, make small/incremental dietary and exercise changes, eat at the table and avoid eating in front of the TV, plan management of cravings, monitor snacking and cravings in food diary Congratulated patient on good medication taking behavior, ongoing weight loss  and 30-40 minutes of Nu Step home exercise each evening Reviewed [place] benefit related to assisting with weight loss Encouraged patient to reschedule with [name] for medical nutrition therapy  Patient Goals/Self-Care Activities: Take medications as prescribed   Attend all scheduled provider appointments Call pharmacy for medication refills 3-7 days in advance of running out of medications Perform all self care activities independently  Perform IADL's (shopping, preparing meals, housekeeping, managing finances) independently Call provider office for new concerns or questions  Call and schedule dentist appointment with [name] for check up and cleaning as soon as possible , phone number [phone] Reschedule a follow up appointment with [name], dietician and [place], to help with ongoing weight loss Patient to follow up on CPAP   Long-Range Goal:  Establish Plan of Care for Chronic Disease Management Needs   Start Date: 10/21/2021  Expected End Date: 01/21/2022  Priority: High  Note:   Timeframe:  Long-Range Goal Priority:  High Start Date:     10/21/21                        Expected End Date:  ongoing                     Follow Up Date 12/30/21   - schedule appointment for vaccines needed due to my age or health - schedule recommended health tests (blood work, mammogram, colonoscopy, pap test) - schedule and keep appointment for annual check-up    Why is this important?   Screening tests can find diseases early when they are easier to treat.  Your doctor or nurse will talk with you about which tests are important for you.  Getting shots for common diseases like the flu and shingles will help prevent them.  11/21/21:  Patient seen and evaluated by Nephrologist 11/13/21.  Seen and evaluated by pain management last week.  Upcoming hip replacement surgery 12/16/21    Follow Up:  Patient agrees to Care Plan and Follow-up.  Plan: The Managed Medicaid care management team will reach out to the patient again over the next 30 business  days. and The  Patient has been provided with contact information for the Managed Medicaid care management team and has been advised to call with any health related questions or concerns.  Date/time of next scheduled RN care management/care coordination outreach:  12/30/21 at 230.

## 2021-11-24 DIAGNOSIS — M1612 Unilateral primary osteoarthritis, left hip: Secondary | ICD-10-CM | POA: Diagnosis not present

## 2021-12-01 NOTE — H&P (Signed)
HIP ARTHROPLASTY ADMISSION H&P  Patient ID: [name] MRN: [id number] DOB/AGE: 29-Apr-1971 51 y.o.  Chief Complaint: left hip pain.  Planned Procedure Date: 12/16/21 Medical Clearance by Demetrius Revel, NP   Cardiac Clearance by Dr. Harl Bowie Nephrology clearance by Dr. Theador Hawthorne PM&R clearance by Dr. Red Christians (wants [name] to manage post-op narcotics)   HPI: [name] is a 51 y.o. male who presents for evaluation of OA LEFT HIP. The patient has a history of pain and functional disability in the left hip due to arthritis and has failed non-surgical conservative treatments for greater than 12 weeks to include NSAID's and/or analgesics, corticosteriod injections, supervised PT with diminished ADL's post treatment, use of assistive devices, weight reduction as appropriate, and activity modification.  Onset of symptoms was gradual, starting 4 years ago with gradually worsening course since that time. The patient noted no past surgery on the left hip.  Patient currently rates pain at 9 out of 10 with activity. Patient has night pain, worsening of pain with activity and weight bearing, and pain that interferes with activities of daily living.  Patient has evidence of subchondral cysts, subchondral sclerosis, periarticular osteophytes, joint space narrowing, and osteolysis  by imaging studies.  There is no active infection.  Past Medical History:  Diagnosis Date   ALLERGY 08/03/2008   Qualifier: Diagnosis of  By: Christ Kick     Anemia    Arthritis    "hips" (01/15/2015)   Blood transfusion without reported diagnosis    Cellulitis 07/25/2018   Chronic back pain greater than 3 months duration 02/11/2017   Chronic lower back pain    Colon polyps    COLONIC POLYPS, ADENOMATOUS 08/03/2008   Qualifier: Diagnosis of  By: Christ Kick     CVA 08/03/2008   Qualifier: History of  By: Christ Kick     Depression    denies   Diabetes mellitus    diet controlled- no meds since wt loss    Diabetes mellitus ([place]) 07/10/2019   Dysrhythmia    s/p surgery bariatric- cardioversion   Erectile dysfunction 01/26/2018   GERD (gastroesophageal reflux disease)    HIATAL HERNIA 08/03/2008   Qualifier: Diagnosis of  By: Christ Kick     Hip pain    History of gout    Hyperlipidemia    Hypertension    Joint pain    Narcotic dependence ([place]) 02/11/2017   Neuromuscular disorder ([place])    PONV (postoperative nausea and vomiting)    Pre-diabetes    Prediabetes 03/29/2017   Sleep apnea    no longer have to use cpap since wt loss   Stage 3 chronic kidney disease ([place]) 01/26/2018   Stroke ([place]) 2006   denies residual on 01/15/2015   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 ([place]) 08/01/2018   Past Surgical History:  Procedure Laterality Date   BARIATRIC SURGERY  2010   in [place]  02/26/2021   Procedure: BIOPSY;  Surgeon: Harvel Quale, MD;  [facility];  Service: Gastroenterology;;   CARDIOVERSION     COLONOSCOPY N/A 08/02/2014   Procedure: COLONOSCOPY;  Surgeon: Rogene Houston, MD;  [facility];  Service: Endoscopy;  Laterality: N/A;  210 - moved to 3/10 @ 11:55 - Ann notified pt   COLONOSCOPY WITH PROPOFOL N/A 02/26/2021   Procedure: COLONOSCOPY WITH PROPOFOL;  Surgeon: Harvel Quale, MD;  [facility];  Service: Gastroenterology;  Laterality: N/A;  10:00  ESOPHAGOGASTRODUODENOSCOPY     ESOPHAGOGASTRODUODENOSCOPY (EGD) WITH PROPOFOL N/A 02/26/2021   Procedure: ESOPHAGOGASTRODUODENOSCOPY (EGD) WITH PROPOFOL;  Surgeon: Harvel Quale, MD;  [facility];  Service: Gastroenterology;  Laterality: N/A;   HERNIA REPAIR     LIPOSUCTION     PANNICULECTOMY  01/14/2015   PANNICULECTOMY N/A 01/14/2015   Procedure:  TOTAL PANNICULECTOMY WTH LIPOSUCTION OF SIDES;  Surgeon: Cristine Polio, MD;  Location: [facility];  Service: Plastics;  Laterality: N/A;   POLYPECTOMY  02/26/2021   Procedure: POLYPECTOMY  INTESTINAL;  Surgeon: Harvel Quale, MD;  [facility];  Service: Gastroenterology;;   TONSILLECTOMY     VENTRAL HERNIA REPAIR  01/14/2015   VENTRAL HERNIA REPAIR N/A 01/14/2015   Procedure: HERNIA REPAIR VENTRAL ADULT AND MUSCLE REPAIR;  Surgeon: Cristine Polio, MD;  Location: [facility];  Service: Plastics;  Laterality: N/A;   No Known Allergies Prior to Admission medications   Medication Sig Start Date End Date Taking? Authorizing Provider  aspirin EC 81 MG tablet Take 81 mg by mouth daily. Swallow whole.   Yes [provider]  atorvastatin (LIPITOR) 40 MG tablet Take 1 tablet (40 mg total) by mouth daily. 01/14/21  Yes [name], NP  FARXIGA 5 MG TABS tablet Take 5 mg by mouth every morning. 09/09/21  Yes [provider]  gabapentin (NEURONTIN) 300 MG capsule Take 1 capsule (300 mg total) by mouth at bedtime. 10/07/21  Yes Paseda, Folashade R, FNP  KERENDIA 10 MG TABS Take 10 mg by mouth daily. 11/13/21  Yes [provider]  latanoprost (XALATAN) 0.005 % ophthalmic solution Place 1 drop into both eyes at bedtime.   Yes [provider]  losartan (COZAAR) 25 MG tablet Take 1 tablet (25 mg total) by mouth daily. 04/14/21 04/14/22 Yes [name], MD  Multiple Vitamin (MULTIVITAMIN) tablet Take 1 tablet by mouth daily. One a day   Yes [provider]  NARCAN 4 MG/0.1ML LIQD nasal spray kit Place 0.4 mg into the nose once. 01/18/19  Yes [provider]  Oxycodone HCl 20 MG TABS Take 20 mg by mouth 4 (four) times daily as needed (pain). 11/23/19  Yes [provider]  Semaglutide, 2 MG/DOSE, 8 MG/3ML SOPN Inject 2 mg as directed once a week. Patient taking differently: Inject 2 mg as directed every Wednesday. 05/13/21  Yes [name], NP  sildenafil (VIAGRA) 50 MG tablet Take 1 tablet (50 mg total) by mouth daily as needed for erectile dysfunction. 09/01/21  Yes Paseda, [name], FNP  timolol (BETIMOL) 0.5 %  ophthalmic solution Place 1 drop into both eyes in the morning.   Yes [provider]  vitamin B-12 (CYANOCOBALAMIN) 1000 MCG tablet Take 1,000 mcg by mouth daily.   Yes [provider]  blood glucose meter kit and supplies KIT Test daily Dx  r73.03 06/26/19   [name], DO  Blood Pressure Monitor KIT 1 each by Does not apply route daily. 01/08/21   [name], NP  cyclobenzaprine (FLEXERIL) 10 MG tablet TAKE 1 TABLET BY MOUTH 2 TIMES A DAY AS NEEDED FOR MUSCLE SPASMS. Patient not taking: Reported on 11/27/2021 10/07/21   Vena Rua R, FNP  glucose blood test strip Use as instructed. Can check up to 4 times daily. 01/13/21   [name], NP   Social History   Socioeconomic History   Marital status: Single    Spouse name: Not on file   Number of children:  0   Years of education: 77   Highest education level: Not on file  Occupational History   Not on file  Tobacco Use   Smoking status: Never   Smokeless tobacco: Never  Vaping Use   Vaping Use: Never used  Substance and Sexual Activity   Alcohol use: No   Drug use: No   Sexual activity: Yes    Birth control/protection: Condom  Other Topics Concern   Not on file  Social History Narrative   Lives alone   Cat-Mr [place]oss[place]      Enjoy races, eat out, watch movies, listen music, reads some      Diet: Snacks a lot, tried to avoid red meat, does eat chicken and [place], eats a lot of carbs and breads (chips), and sweets   Caffeine: drinks a can monster daily   Water: 3-4 bottles 16.9 oz      Wear seat belt   Wear sunscreen   Smoke and carbon monoxide detectors    Reports not using phone when driving   Social Determinants of Health   Financial Resource Strain: Low Risk  (10/21/2021)   Overall Financial Resource Strain (CARDIA)    Difficulty of Paying Living Expenses: Not hard at all  Food Insecurity: No Food Insecurity (11/21/2021)   Hunger Vital Sign    Worried About Running Out of Food in the Last  Year: Never true    [place] in the Last Year: Never true  Transportation Needs: No Transportation Needs (01/07/2021)   PRAPARE - Hydrologist (Medical): No    Lack of Transportation (Non-Medical): No  Physical Activity: Sufficiently Active (01/07/2021)   Exercise Vital Sign    Days of Exercise per Week: 7 days    Minutes of Exercise per Session: 40 min  Stress: No Stress Concern Present (01/07/2021)   [place]    Feeling of Stress : Only a little  Social Connections: Unknown (01/07/2021)   Social Connection and Isolation Panel [NHANES]    Frequency of Communication with Friends and Family: Not on file    Frequency of Social Gatherings with Friends and Family: Not on file    Attends Religious Services: Never    Marine scientist or Organizations: No    Attends Music therapist: Never    Marital Status: Not on file   Family History  Problem Relation Age of Onset   COPD Mother    Depression Mother    Hyperlipidemia Mother    Hypertension Mother    Heart disease Mother    Thyroid disease Mother    Anxiety disorder Mother    Diabetes Father    Pulmonary embolism Father 18       blood clot   Hyperlipidemia Father    Sudden death Father    Obesity Father    Eating disorder Father    Hypertension Brother     ROS: Currently denies lightheadedness, dizziness, Fever, chills, CP, SOB.   No personal history of DVT, PE, or MI. + h/o CVA in 2006 and 2010 No loose teeth or dentures All other systems have been reviewed and were otherwise currently negative with the exception of those mentioned in the HPI and as above.  Objective: Vitals: Ht: 5'8" Wt: 262.6 lbs Temp: 98.1 BP: 114/82 Pulse: 95 O2 93% on room air.   Physical Exam: General: Alert, NAD. Trendelenberg Gait. Kyphotic posture HEENT: EOMI, Good Neck  Extension  Pulm: No increased work of breathing.  Clear B/L  A/P w/o crackle or wheeze.  CV: RRR, No m/g/r appreciated  GI: soft, NT, ND. BS x 4 quadrants Neuro: CN II-XII grossly intact without focal deficit.  Sensation intact distally Skin: No lesions in the area of chief complaint MSK/Surgical Site: + TTP. Decreased hip ROM d/t pain. + [place]. + SLR. + FABER/FADIR. 5/5 strength.  NVI.    Imaging Review Plain radiographs demonstrate severe degenerative joint disease of the bilateral hip.   The bone quality appears to be poor for age and reported activity level.  Preoperative templating of the joint replacement has been completed, documented, and submitted to the Operating Room personnel in order to optimize intra-operative equipment management.  Assessment: OA LEFT HIP Active Problems:   * No active hospital problems. *   Plan: Plan for Procedure(s): TOTAL HIP ARTHROPLASTY ANTERIOR APPROACH  The patient history, physical exam, clinical judgement of the provider and imaging are consistent with end stage degenerative joint disease and total joint arthroplasty is deemed medically necessary. The treatment options including medical management, injection therapy, and arthroplasty were discussed at length. The risks and benefits of Procedure(s): TOTAL HIP ARTHROPLASTY ANTERIOR APPROACH were presented and reviewed.  The risks of nonoperative treatment, versus surgical intervention including but not limited to continued pain, aseptic loosening, stiffness, dislocation/subluxation, infection, bleeding, nerve injury, blood clots, cardiopulmonary complications, morbidity, mortality, among others were discussed. The patient verbalizes understanding and wishes to proceed with the plan.  Patient is being admitted for surgery, pain control, PT, prophylactic antibiotics, VTE prophylaxis, progressive ambulation, ADL's and discharge planning. He will spend the night in observation.   Dental prophylaxis discussed and recommended for 2 years  postoperatively.  The patient does meet the criteria for TXA which will be used perioperatively.   Xarelto 48m daily will be used postoperatively for DVT prophylaxis in addition to SCDs, and early ambulation. Plan for Oxycodone and Tylenol for pain.   No NSAIDs d/t CKD and h/o gastric bypass. Robaxin for muscle spasm.  Zofran for nausea and vomiting. Miralax for constipation [place] patient is planning to be discharged home with OPPT and into the care of his mother RAlinda Moneywho can be reached at [phone] up appt 12-31-21 at 4:15pm   He tested + for MRSA and Staph at his pre-op lab appointment so he will get Vancomycin in the pre-op holding area. Mupirocin ointment was sent to use for 7 days prior to surgery.    MBritt Bottom PVermontOffice [phone]/02/2022 3:48 PM

## 2021-12-03 NOTE — Patient Instructions (Addendum)
DUE TO COVID-19 ONLY TWO VISITORS  (aged 52 and older)  ARE ALLOWED TO COME WITH YOU AND STAY IN THE WAITING ROOM ONLY DURING PRE OP AND PROCEDURE.   **NO VISITORS ARE ALLOWED IN THE SHORT STAY AREA OR RECOVERY ROOM!!**  IF YOU WILL BE ADMITTED INTO THE HOSPITAL YOU ARE ALLOWED ONLY FOUR SUPPORT PEOPLE DURING VISITATION HOURS ONLY (7 AM -8PM)   The support person(s) must pass our screening, gel in and out, and wear a mask at all times, including in the patient's room. Patients must also wear a mask when staff or their support person are in the room. Visitors GUEST BADGE MUST BE WORN VISIBLY  One adult visitor may remain with you overnight and MUST be in the room by 8 P.M.     Your procedure is scheduled on: 12/16/21   Report to [facility] Main Entrance    Report to admitting at : 5:15 AM   Call this number if you have problems the morning of surgery [phone]   Do not eat food :After Midnight.   After Midnight you may have the following liquids until : 4:30 AM DAY OF SURGERY  Water Black Coffee (sugar ok, NO MILK/CREAM OR CREAMERS)  Tea (sugar ok, NO MILK/CREAM OR CREAMERS) regular and decaf                             Plain Jell-O (NO RED)                                           Fruit ices (not with fruit pulp, NO RED)                                     Popsicles (NO RED)                                                                  Juice: apple, WHITE grape, WHITE cranberry Sports drinks like Gatorade (NO RED) Clear broth(vegetable,chicken,beef)     Drink G2 drink AT : 4:30 AM the morning of surgery.      The day of surgery:  Drink ONE (1) Pre-Surgery Clear Ensure or G2 at AM the morning of surgery. Drink in one sitting. Do not sip.  This drink was given to you during your hospital  pre-op appointment visit. Nothing else to drink after completing the  Pre-Surgery Clear Ensure or G2.          If you have questions, please contact your surgeon's office.    Oral Hygiene is also important to reduce your risk of infection.                                    Remember - BRUSH YOUR TEETH THE MORNING OF SURGERY WITH YOUR REGULAR TOOTHPASTE   Do NOT smoke after Midnight   Take these medicines the morning of surgery with A SIP OF WATER: N/A. Use eye drops  as usual. How to Manage Your Diabetes Before and After Surgery  Why is it important to control my blood sugar before and after surgery? Improving blood sugar levels before and after surgery helps healing and can limit problems. A way of improving blood sugar control is eating a healthy diet by:  Eating less sugar and carbohydrates  Increasing activity/exercise  Talking with your doctor about reaching your blood sugar goals High blood sugars (greater than 180 mg/dL) can raise your risk of infections and slow your recovery, so you will need to focus on controlling your diabetes during the weeks before surgery. Make sure that the doctor who takes care of your diabetes knows about your planned surgery including the date and location.  How do I manage my blood sugar before surgery? Check your blood sugar at least 4 times a day, starting 2 days before surgery, to make sure that the level is not too high or low. Check your blood sugar the morning of your surgery when you wake up and every 2 hours until you get to the Short Stay unit. If your blood sugar is less than 70 mg/dL, you will need to treat for low blood sugar: Do not take insulin. Treat a low blood sugar (less than 70 mg/dL) with  cup of clear juice (cranberry or apple), 4 glucose tablets, OR glucose gel. Recheck blood sugar in 15 minutes after treatment (to make sure it is greater than 70 mg/dL). If your blood sugar is not greater than 70 mg/dL on recheck, call [phone] for further instructions. Report your blood sugar to the short stay nurse when you get to Short Stay.  If you are admitted to the hospital after surgery: Your blood sugar  will be checked by the staff and you will probably be given insulin after surgery (instead of oral diabetes medicines) to make sure you have good blood sugar levels. The goal for blood sugar control after surgery is 80-180 mg/dL.   WHAT DO I DO ABOUT MY DIABETES MEDICATION?  Do not take oral diabetes medicines (pills) the morning of surgery.  THE NIGHT BEFORE SURGERY, DO NOT take farxiga.Take semaglutide as usual.     THE MORNING OF SURGERY, DO NOT TAKE ANY ORAL DIABETIC MEDICATIONS DAY OF YOUR SURGERY. DO NOT take semaglutide.  The day of surgery, do not take other diabetes injectables, including Byetta (exenatide), Bydureon (exenatide ER), Victoza (liraglutide), or Trulicity (dulaglutide).  DO NOT TAKE ANY ORAL DIABETIC MEDICATIONS DAY OF YOUR SURGERY  Bring CPAP mask and tubing day of surgery.                              You may not have any metal on your body including hair pins, jewelry, and body piercing             Do not wear make-up, lotions, powders, perfumes/cologne, or deodorant  Do not wear nail polish including gel and S&S, artificial/acrylic nails, or any other type of covering on natural nails including finger and toenails. If you have artificial nails, gel coating, etc. that needs to be removed by a nail salon please have this removed prior to surgery or surgery may need to be canceled/ delayed if the surgeon/ anesthesia feels like they are unable to be safely monitored.   Do not shave  48 hours prior to surgery.               Men may shave  face and neck.   Do not bring valuables to the hospital. [place].   Contacts, dentures or bridgework may not be worn into surgery.   Bring small overnight bag day of surgery.   DO NOT [place]. PHARMACY WILL DISPENSE MEDICATIONS LISTED ON YOUR MEDICATION LIST TO YOU DURING YOUR ADMISSION [place]!    Patients discharged on the day of  surgery will not be allowed to drive home.  Someone NEEDS to stay with you for the first 24 hours after anesthesia.   Special Instructions: Bring a copy of your healthcare power of attorney and living will documents         the day of surgery if you haven't scanned them before.              Please read over the following fact sheets you were given: IF YOU HAVE QUESTIONS ABOUT YOUR PRE-OP INSTRUCTIONS PLEASE CALL [phone]     [facility] - Preparing for Surgery Before surgery, you can play an important role.  Because skin is not sterile, your skin needs to be as free of germs as possible.  You can reduce the number of germs on your skin by washing with CHG (chlorahexidine gluconate) soap before surgery.  CHG is an antiseptic cleaner which kills germs and bonds with the skin to continue killing germs even after washing. Please DO NOT use if you have an allergy to CHG or antibacterial soaps.  If your skin becomes reddened/irritated stop using the CHG and inform your nurse when you arrive at Short Stay. Do not shave (including legs and underarms) for at least 48 hours prior to the first CHG shower.  You may shave your face/neck. Please follow these instructions carefully:  1.  Shower with CHG Soap the night before surgery and the  morning of Surgery.  2.  If you choose to wash your hair, wash your hair first as usual with your  normal  shampoo.  3.  After you shampoo, rinse your hair and body thoroughly to remove the  shampoo.                           4.  Use CHG as you would any other liquid soap.  You can apply chg directly  to the skin and wash                       Gently with a scrungie or clean washcloth.  5.  Apply the CHG Soap to your body ONLY FROM THE NECK DOWN.   Do not use on face/ open                           Wound or open sores. Avoid contact with eyes, ears mouth and genitals (private parts).                       Wash face,  Genitals (private parts) with your normal soap.              6.  Wash thoroughly, paying special attention to the area where your surgery  will be performed.  7.  Thoroughly rinse your body with warm water from the neck down.  8.  DO NOT  shower/wash with your normal soap after using and rinsing off  the CHG Soap.                9.  Pat yourself dry with a clean towel.            10.  Wear clean pajamas.            11.  Place clean sheets on your bed the night of your first shower and do not  sleep with pets. Day of Surgery : Do not apply any lotions/deodorants the morning of surgery.  Please wear clean clothes to the hospital/surgery center.  FAILURE TO FOLLOW THESE INSTRUCTIONS MAY RESULT IN THE CANCELLATION OF YOUR SURGERY PATIENT SIGNATURE_________________________________  NURSE SIGNATURE__________________________________  ________________________________________________________________________   Adam Phenix  An incentive spirometer is a tool that can help keep your lungs clear and active. This tool measures how well you are filling your lungs with each breath. Taking long deep breaths may help reverse or decrease the chance of developing breathing (pulmonary) problems (especially infection) following: A long period of time when you are unable to move or be active. BEFORE THE PROCEDURE  If the spirometer includes an indicator to show your best effort, your nurse or respiratory therapist will set it to a desired goal. If possible, sit up straight or lean slightly forward. Try not to slouch. Hold the incentive spirometer in an upright position. INSTRUCTIONS FOR USE  Sit on the edge of your bed if possible, or sit up as far as you can in bed or on a chair. Hold the incentive spirometer in an upright position. Breathe out normally. Place the mouthpiece in your mouth and seal your lips tightly around it. Breathe in slowly and as deeply as possible, raising the piston or the ball toward the top of the column. Hold your breath for 3-5  seconds or for as long as possible. Allow the piston or ball to fall to the bottom of the column. Remove the mouthpiece from your mouth and breathe out normally. Rest for a few seconds and repeat Steps 1 through 7 at least 10 times every 1-2 hours when you are awake. Take your time and take a few normal breaths between deep breaths. The spirometer may include an indicator to show your best effort. Use the indicator as a goal to work toward during each repetition. After each set of 10 deep breaths, practice coughing to be sure your lungs are clear. If you have an incision (the cut made at the time of surgery), support your incision when coughing by placing a pillow or rolled up towels firmly against it. Once you are able to get out of bed, walk around indoors and cough well. You may stop using the incentive spirometer when instructed by your caregiver.  RISKS AND COMPLICATIONS Take your time so you do not get dizzy or light-headed. If you are in pain, you may need to take or ask for pain medication before doing incentive spirometry. It is harder to take a deep breath if you are having pain. AFTER USE Rest and breathe slowly and easily. It can be helpful to keep track of a log of your progress. Your caregiver can provide you with a simple table to help with this. If you are using the spirometer at home, follow these instructions: [place] IF:  You are having difficultly using the spirometer. You have trouble using the spirometer as often as instructed. Your pain medication is not giving enough  relief while using the spirometer. You develop fever of 100.5 F (38.1 C) or higher. SEEK IMMEDIATE MEDICAL CARE IF:  You cough up bloody sputum that had not been present before. You develop fever of 102 F (38.9 C) or greater. You develop worsening pain at or near the incision site. MAKE SURE YOU:  Understand these instructions. Will watch your condition. Will get help right away if you are  not doing well or get worse. Document Released: 09/21/2006 Document Revised: 08/03/2011 Document Reviewed: 11/22/2006 [facility] Patient Information 2014 [place], [place].   ________________________________________________________________________

## 2021-12-04 ENCOUNTER — Other Ambulatory Visit: Payer: Self-pay

## 2021-12-04 ENCOUNTER — Encounter (HOSPITAL_COMMUNITY): Payer: Self-pay

## 2021-12-04 ENCOUNTER — Encounter (HOSPITAL_COMMUNITY)
Admission: RE | Admit: 2021-12-04 | Discharge: 2021-12-04 | Disposition: A | Discharge status: Home / Self Care | Payer: Medicaid Other | Source: Ambulatory Visit | Attending: Orthopedic Surgery | Admitting: Orthopedic Surgery

## 2021-12-04 VITALS — BP 133/80 | HR 87 | Temp 98.2°F | Resp 17 | Ht 69.0 in | Wt 263.0 lb

## 2021-12-04 DIAGNOSIS — Z01812 Encounter for preprocedural laboratory examination: Secondary | ICD-10-CM | POA: Insufficient documentation

## 2021-12-04 DIAGNOSIS — Z01818 Encounter for other preprocedural examination: Secondary | ICD-10-CM

## 2021-12-04 DIAGNOSIS — I10 Essential (primary) hypertension: Secondary | ICD-10-CM | POA: Diagnosis not present

## 2021-12-04 DIAGNOSIS — E1121 Type 2 diabetes mellitus with diabetic nephropathy: Secondary | ICD-10-CM | POA: Insufficient documentation

## 2021-12-04 LAB — BASIC METABOLIC PANEL
Anion gap: 7 (ref 5–15)
BUN: 25 mg/dL — ABNORMAL HIGH (ref 6–20)
CO2: 26 mmol/L (ref 22–32)
Calcium: 9.6 mg/dL (ref 8.9–10.3)
Chloride: 105 mmol/L (ref 98–111)
Creatinine, Ser: 1.62 mg/dL — ABNORMAL HIGH (ref 0.61–1.24)
GFR, Estimated: 51 mL/min — ABNORMAL LOW (ref >60)
Glucose, Bld: 125 mg/dL — ABNORMAL HIGH (ref 70–99)
Potassium: 3.9 mmol/L (ref 3.5–5.1)
Sodium: 138 mmol/L (ref 135–145)

## 2021-12-04 LAB — CBC
HCT: 45.1 % (ref 39.0–52.0)
Hemoglobin: 15.5 g/dL (ref 13.0–17.0)
MCH: 31.8 pg (ref 26.0–34.0)
MCHC: 34.4 g/dL (ref 30.0–36.0)
MCV: 92.4 fL (ref 80.0–100.0)
Platelets: 178 10*3/uL (ref 150–400)
RBC: 4.88 MIL/uL (ref 4.22–5.81)
RDW: 13 % (ref 11.5–15.5)
WBC: 6.8 10*3/uL (ref 4.0–10.5)
nRBC: 0 % (ref 0.0–0.2)

## 2021-12-04 LAB — TYPE AND SCREEN
ABO/RH(D): O POS
Antibody Screen: NEGATIVE

## 2021-12-04 LAB — GLUCOSE, CAPILLARY: Glucose-Capillary: 139 mg/dL — ABNORMAL HIGH (ref 70–99)

## 2021-12-04 LAB — SURGICAL PCR SCREEN
MRSA, PCR: POSITIVE — AB
Staphylococcus aureus: POSITIVE — AB

## 2021-12-04 NOTE — Progress Notes (Signed)
PCR: + MRSA./+ STAPH 

## 2021-12-04 NOTE — Progress Notes (Signed)
For Short Stay: [place] appointment date: Date of COVID positive in last 32 days:  Bowel Prep reminder:   For Anesthesia: PCP - Vena Rua: FNP Cardiologist - Dr. Carlyle Dolly. LOV: 11/05/21  Chest x-ray -  EKG - 06/26/21 Stress Test -  ECHO - 02/21/19 Cardiac Cath -  Pacemaker/ICD device last checked: Pacemaker orders received: Device Rep notified:  Spinal Cord Stimulator:  Sleep Study - Yes CPAP - NO  Fasting Blood Sugar - 100's Checks Blood Sugar ___1__ times a day Date and result of last Hgb A1c- 6.1: 10/17/21  Blood Thinner Instructions: Aspirin Instructions: Last Dose:  Activity level: Can go up a flight of stairs and activities of daily living without stopping and without chest pain and/or shortness of breath   Able to exercise without chest pain and/or shortness of breath   Unable to go up a flight of stairs without chest pain and/or shortness of breath     Anesthesia review: Hx: HTN,DIA,CKD III,CVA,Dysrhythmias,OSA(NO CPAP)  Patient denies shortness of breath, fever, cough and chest pain at PAT appointment   Patient verbalized understanding of instructions that were given to them at the PAT appointment. Patient was also instructed that they will need to review over the PAT instructions again at home before surgery.

## 2021-12-06 DIAGNOSIS — M5136 Other intervertebral disc degeneration, lumbar region: Secondary | ICD-10-CM | POA: Diagnosis not present

## 2021-12-08 NOTE — Progress Notes (Signed)
Anesthesia Chart Review   Case: [id number] Date/Time: 12/16/21 0715   Procedure: TOTAL HIP ARTHROPLASTY ANTERIOR APPROACH (Left: Hip)   Anesthesia type: Spinal   Pre-op diagnosis: OA LEFT HIP   Location: [facility] ROOM 08 / [facility]   Surgeons: Renette Butters, MD       DISCUSSION:51 y.o. never smoker with h/o PONV, GERD, sleep apnea, DM II, CKD Stage III, left hip OA scheduled for above procedure 12/16/2021 with Dr. Edmonia Lynch.   Pt last seen by cardiology 11/05/2021. Per note cleared for upcoming orthopedic procedure.   Anticipate pt can proceed with planned procedure barring acute status change.   VS: BP 133/80 (BP Location: Right Arm)   Pulse 87   Temp 36.8 C (Oral)   Resp 17   Ht $R'5\' 9"'kq$  (1.753 m)   Wt 119.3 kg   SpO2 99%   BMI 38.84 kg/m   PROVIDERS: [name], FNP is PCP   Carlyle Dolly, MD is Cardiologist  LABS: Labs reviewed: Acceptable for surgery. (all labs ordered are listed, but only abnormal results are displayed)  Labs Reviewed  SURGICAL PCR SCREEN - Abnormal; Notable for the following components:      Result Value   MRSA, PCR POSITIVE (*)    Staphylococcus aureus POSITIVE (*)    All other components within normal limits  BASIC METABOLIC PANEL - Abnormal; Notable for the following components:   Glucose, Bld 125 (*)    BUN 25 (*)    Creatinine, Ser 1.62 (*)    GFR, Estimated 51 (*)    All other components within normal limits  GLUCOSE, CAPILLARY - Abnormal; Notable for the following components:   Glucose-Capillary 139 (*)    All other components within normal limits  CBC  TYPE AND SCREEN     IMAGES:   EKG: 06/26/2021 Rate 92 bpm  NSR RBBB LAFB  CV: Myocardial Perfusion 07/02/2021   Findings are consistent with ischemia. The study is low risk.   No ST deviation was noted.   LV perfusion is abnormal. There is evidence of ischemia. There is no evidence of infarction. Defect 1: There is a medium defect with mild reduction in uptake  present in the inferoseptal location(s) that is reversible.   Left ventricular function is normal. End diastolic cavity size is normal. End systolic cavity size is normal.   Diaphragmatic attenuation and motion artifact Baseline ECG RBBB/LAFB SDS 6 with area of inferior septal/apical ischemia EF normal 68%   Echo 02/21/2019 1. Left ventricular ejection fraction, by visual estimation, is 60 to  65%. The left ventricle has normal function. Normal left ventricular size.  There is moderately increased left ventricular hypertrophy.   2. Left ventricular diastolic Doppler parameters are consistent with  impaired relaxation pattern of LV diastolic filling.   3. Global right ventricle has normal systolic function.The right  ventricular size is normal. No increase in right ventricular wall  thickness.   4. Left atrial size was normal.   5. Right atrial size was normal.   6. The mitral valve is normal in structure. No evidence of mitral valve  regurgitation. No evidence of mitral stenosis.   7. The tricuspid valve is normal in structure. Tricuspid valve  regurgitation was not visualized by color flow Doppler.   8. The aortic valve is normal in structure. Aortic valve regurgitation  was not visualized by color flow Doppler. Structurally normal aortic  valve, with no evidence of sclerosis or stenosis.   9. The pulmonic valve  was normal in structure. Pulmonic valve  regurgitation is not visualized by color flow Doppler.  10. The inferior vena cava is normal in size with greater than 50%  respiratory variability, suggesting right atrial pressure of 3 mmHg. Past Medical History:  Diagnosis Date   ALLERGY 08/03/2008   Qualifier: Diagnosis of  By: Christ Kick     Anemia    Arthritis    "hips" (01/15/2015)   Blood transfusion without reported diagnosis    Cellulitis 07/25/2018   Chronic back pain greater than 3 months duration 02/11/2017   Chronic lower back pain    Colon polyps    COLONIC  POLYPS, ADENOMATOUS 08/03/2008   Qualifier: Diagnosis of  By: Christ Kick     CVA 08/03/2008   Qualifier: History of  By: Christ Kick     Depression    denies   Diabetes mellitus    diet controlled- no meds since wt loss   Diabetes mellitus ([place]) 07/10/2019   Dysrhythmia    s/p surgery bariatric- cardioversion   Erectile dysfunction 01/26/2018   GERD (gastroesophageal reflux disease)    HIATAL HERNIA 08/03/2008   Qualifier: Diagnosis of  By: Christ Kick     Hip pain    History of gout    Hyperlipidemia    Hypertension    Joint pain    Narcotic dependence ([place]) 02/11/2017   Neuromuscular disorder ([place])    PONV (postoperative nausea and vomiting)    Prediabetes 03/29/2017   Sleep apnea    no longer have to use cpap since wt loss   Stage 3 chronic kidney disease ([place]) 01/26/2018   Stroke ([place]) 2006   denies residual on 01/15/2015   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 ([place]) 08/01/2018    Past Surgical History:  Procedure Laterality Date   BARIATRIC SURGERY  2010   in [place]  02/26/2021   Procedure: BIOPSY;  Surgeon: Harvel Quale, MD;  [facility];  Service: Gastroenterology;;   CARDIOVERSION     COLONOSCOPY N/A 08/02/2014   Procedure: COLONOSCOPY;  Surgeon: Rogene Houston, MD;  [facility];  Service: Endoscopy;  Laterality: N/A;  210 - moved to 3/10 @ 11:55 - Ann notified pt   COLONOSCOPY WITH PROPOFOL N/A 02/26/2021   Procedure: COLONOSCOPY WITH PROPOFOL;  Surgeon: Harvel Quale, MD;  [facility];  Service: Gastroenterology;  Laterality: N/A;  10:00   ESOPHAGOGASTRODUODENOSCOPY     ESOPHAGOGASTRODUODENOSCOPY (EGD) WITH PROPOFOL N/A 02/26/2021   Procedure: ESOPHAGOGASTRODUODENOSCOPY (EGD) WITH PROPOFOL;  Surgeon: Harvel Quale, MD;  [facility];  Service: Gastroenterology;  Laterality: N/A;   HERNIA REPAIR     LIPOSUCTION     PANNICULECTOMY  01/14/2015    PANNICULECTOMY N/A 01/14/2015   Procedure:  TOTAL PANNICULECTOMY WTH LIPOSUCTION OF SIDES;  Surgeon: Cristine Polio, MD;  Location: [facility];  Service: Plastics;  Laterality: N/A;   POLYPECTOMY  02/26/2021   Procedure: POLYPECTOMY INTESTINAL;  Surgeon: Harvel Quale, MD;  [facility];  Service: Gastroenterology;;   TONSILLECTOMY     VENTRAL HERNIA REPAIR  01/14/2015   VENTRAL HERNIA REPAIR N/A 01/14/2015   Procedure: HERNIA REPAIR VENTRAL ADULT AND MUSCLE REPAIR;  Surgeon: Cristine Polio, MD;  Location: [facility];  Service: Plastics;  Laterality: N/A;    MEDICATIONS:  ferrous sulfate 325 (65 FE) MG tablet   aspirin EC 81 MG tablet   atorvastatin (LIPITOR) 40 MG tablet   blood  glucose meter kit and supplies KIT   Blood Pressure Monitor KIT   cyclobenzaprine (FLEXERIL) 10 MG tablet   FARXIGA 5 MG TABS tablet   gabapentin (NEURONTIN) 300 MG capsule   glucose blood test strip   KERENDIA 10 MG TABS   latanoprost (XALATAN) 0.005 % ophthalmic solution   losartan (COZAAR) 25 MG tablet   Multiple Vitamin (MULTIVITAMIN) tablet   NARCAN 4 MG/0.1ML LIQD nasal spray kit   Oxycodone HCl 20 MG TABS   Semaglutide, 2 MG/DOSE, 8 MG/3ML SOPN   sildenafil (VIAGRA) 50 MG tablet   timolol (BETIMOL) 0.5 % ophthalmic solution   vitamin B-12 (CYANOCOBALAMIN) 1000 MCG tablet   No current facility-administered medications for this encounter.   Konrad Felix Ward, PA-C [facility] [phone]

## 2021-12-08 NOTE — Anesthesia Preprocedure Evaluation (Addendum)
Anesthesia Evaluation  Patient identified by MRN, date of birth, ID band Patient awake    Reviewed: Allergy & Precautions, NPO status , Patient's Chart, lab work & pertinent test results, reviewed documented beta blocker date and time   History of Anesthesia Complications (+) PONV and history of anesthetic complications  Airway Mallampati: III  TM Distance: >3 FB Neck ROM: Full    Dental no notable dental hx. (+) Teeth Intact, Dental Advisory Given   Pulmonary sleep apnea ,  No longer on CPAP since weight loss   Pulmonary exam normal breath sounds clear to auscultation       Cardiovascular hypertension, Pt. 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   Endo/Other  diabetes, Well Controlled, Type 2, Insulin DependentMorbid obesityHyperlipidemia Gout  Renal/GU Renal InsufficiencyRenal disease  negative genitourinary   Musculoskeletal  (+) Arthritis , narcotic dependentOA left hip Ventral incisional hernia   Abdominal (+) + obese,   Peds  Hematology  (+) Blood dyscrasia, anemia ,   Anesthesia Other Findings   Reproductive/Obstetrics ED                           Anesthesia Physical Anesthesia Plan  ASA: 3  Anesthesia Plan: Spinal   Post-op Pain Management: Precedex, Ketamine IV*, Dilaudid IV and Tylenol PO (pre-op)*   Induction: Intravenous  PONV Risk Score and Plan: 3 and Treatment may vary due to age or medical condition and Midazolam  Airway Management Planned: Natural Airway and Simple Face Mask  Additional Equipment: None  Intra-op Plan:   Post-operative Plan:   Informed  Consent: I have reviewed the patients History and Physical, chart, labs and discussed the procedure including the risks, benefits and alternatives for the proposed anesthesia with the patient or authorized representative who has indicated his/her understanding and acceptance.     Dental advisory given  Plan Discussed with: Anesthesiologist and CRNA  Anesthesia Plan Comments: (See PAT note 12/04/2021)      Anesthesia Quick Evaluation

## 2021-12-10 DIAGNOSIS — Z79899 Other long term (current) drug therapy: Secondary | ICD-10-CM | POA: Diagnosis not present

## 2021-12-15 ENCOUNTER — Encounter (HOSPITAL_COMMUNITY): Payer: Self-pay | Admitting: Orthopedic Surgery

## 2021-12-16 ENCOUNTER — Ambulatory Visit (HOSPITAL_COMMUNITY): Payer: Medicaid Other

## 2021-12-16 ENCOUNTER — Encounter (HOSPITAL_COMMUNITY): Payer: Self-pay | Admitting: Orthopedic Surgery

## 2021-12-16 ENCOUNTER — Ambulatory Visit (HOSPITAL_COMMUNITY): Payer: Medicaid Other | Admitting: Physician Assistant

## 2021-12-16 ENCOUNTER — Other Ambulatory Visit: Payer: Self-pay

## 2021-12-16 ENCOUNTER — Ambulatory Visit (HOSPITAL_BASED_OUTPATIENT_CLINIC_OR_DEPARTMENT_OTHER): Payer: Medicaid Other | Admitting: Physician Assistant

## 2021-12-16 ENCOUNTER — Encounter (HOSPITAL_COMMUNITY)
Admission: RE | Disposition: A | Discharge status: Home / Self Care | Payer: Self-pay | Source: Ambulatory Visit | Attending: Orthopedic Surgery

## 2021-12-16 ENCOUNTER — Observation Stay (HOSPITAL_COMMUNITY)
Admission: RE | Admit: 2021-12-16 | Discharge: 2021-12-17 | Disposition: A | Discharge status: Home / Self Care | Payer: Medicaid Other | Source: Ambulatory Visit | Attending: Orthopedic Surgery | Admitting: Orthopedic Surgery

## 2021-12-16 DIAGNOSIS — N183 Chronic kidney disease, stage 3 unspecified: Secondary | ICD-10-CM | POA: Diagnosis not present

## 2021-12-16 DIAGNOSIS — I10 Essential (primary) hypertension: Secondary | ICD-10-CM | POA: Diagnosis not present

## 2021-12-16 DIAGNOSIS — Z794 Long term (current) use of insulin: Secondary | ICD-10-CM

## 2021-12-16 DIAGNOSIS — Z96642 Presence of left artificial hip joint: Secondary | ICD-10-CM | POA: Diagnosis present

## 2021-12-16 DIAGNOSIS — M1612 Unilateral primary osteoarthritis, left hip: Principal | ICD-10-CM | POA: Insufficient documentation

## 2021-12-16 DIAGNOSIS — E1122 Type 2 diabetes mellitus with diabetic chronic kidney disease: Secondary | ICD-10-CM | POA: Insufficient documentation

## 2021-12-16 DIAGNOSIS — G4730 Sleep apnea, unspecified: Secondary | ICD-10-CM | POA: Diagnosis not present

## 2021-12-16 DIAGNOSIS — I129 Hypertensive chronic kidney disease with stage 1 through stage 4 chronic kidney disease, or unspecified chronic kidney disease: Secondary | ICD-10-CM | POA: Diagnosis not present

## 2021-12-16 DIAGNOSIS — E119 Type 2 diabetes mellitus without complications: Secondary | ICD-10-CM | POA: Diagnosis not present

## 2021-12-16 DIAGNOSIS — Z8673 Personal history of transient ischemic attack (TIA), and cerebral infarction without residual deficits: Secondary | ICD-10-CM | POA: Insufficient documentation

## 2021-12-16 DIAGNOSIS — Z7982 Long term (current) use of aspirin: Secondary | ICD-10-CM | POA: Diagnosis not present

## 2021-12-16 DIAGNOSIS — Z79899 Other long term (current) drug therapy: Secondary | ICD-10-CM | POA: Insufficient documentation

## 2021-12-16 HISTORY — PX: TOTAL HIP ARTHROPLASTY: SHX124

## 2021-12-16 LAB — GLUCOSE, CAPILLARY
Glucose-Capillary: 117 mg/dL — ABNORMAL HIGH (ref 70–99)
Glucose-Capillary: 123 mg/dL — ABNORMAL HIGH (ref 70–99)
Glucose-Capillary: 278 mg/dL — ABNORMAL HIGH (ref 70–99)
Glucose-Capillary: 249 mg/dL — ABNORMAL HIGH (ref 70–99)

## 2021-12-16 SURGERY — ARTHROPLASTY, HIP, TOTAL, ANTERIOR APPROACH
Anesthesia: Spinal | Site: Hip | Laterality: Left

## 2021-12-16 MED ORDER — METOCLOPRAMIDE HCL 5 MG/ML IJ SOLN: 5.0000 mg | Freq: Three times a day (TID) | INTRAMUSCULAR | Status: DC | PRN

## 2021-12-16 MED ORDER — MENTHOL 3 MG MT LOZG: 1.0000 | LOZENGE | OROMUCOSAL | Status: DC | PRN

## 2021-12-16 MED ORDER — PHENYLEPHRINE 80 MCG/ML (10ML) SYRINGE FOR IV PUSH (FOR BLOOD PRESSURE SUPPORT)
PREFILLED_SYRINGE | INTRAVENOUS | Status: AC
Start: 2021-12-16 — End: ?
  Filled 2021-12-16: qty 10

## 2021-12-16 MED ORDER — ATORVASTATIN CALCIUM 40 MG PO TABS
40.0000 mg | ORAL_TABLET | Freq: Every day | ORAL | Status: DC
  Administered 2021-12-17: 40 mg via ORAL
  Filled 2021-12-16: qty 1

## 2021-12-16 MED ORDER — WATER FOR IRRIGATION, STERILE IR SOLN
Status: DC | PRN
  Administered 2021-12-16: 2000 mL

## 2021-12-16 MED ORDER — OXYCODONE HCL 5 MG/5ML PO SOLN: 5.0000 mg | Freq: Once | ORAL | Status: DC | PRN

## 2021-12-16 MED ORDER — RIVAROXABAN 10 MG PO TABS
10.0000 mg | ORAL_TABLET | Freq: Every day | ORAL | Status: DC
  Administered 2021-12-17: 10 mg via ORAL
  Filled 2021-12-16: qty 1

## 2021-12-16 MED ORDER — PROPOFOL 500 MG/50ML IV EMUL
INTRAVENOUS | Status: AC
  Filled 2021-12-16: qty 50

## 2021-12-16 MED ORDER — VANCOMYCIN HCL 1500 MG/300ML IV SOLN
1500.0000 mg | INTRAVENOUS | Status: AC
  Administered 2021-12-16 (×2): 1500 mg via INTRAVENOUS
  Filled 2021-12-16: qty 300

## 2021-12-16 MED ORDER — TIMOLOL HEMIHYDRATE 0.5 % OP SOLN
1.0000 [drp] | Freq: Every morning | OPHTHALMIC | Status: DC
Start: 2021-12-17 — End: 2021-12-16

## 2021-12-16 MED ORDER — LACTATED RINGERS IV SOLN: INTRAVENOUS | Status: DC

## 2021-12-16 MED ORDER — ALUM & MAG HYDROXIDE-SIMETH 200-200-20 MG/5ML PO SUSP
30.0000 mL | ORAL | Status: DC | PRN
  Administered 2021-12-16: 30 mL via ORAL
  Filled 2021-12-16: qty 30

## 2021-12-16 MED ORDER — PHENYLEPHRINE HCL-NACL 20-0.9 MG/250ML-% IV SOLN
INTRAVENOUS | Status: DC | PRN
  Administered 2021-12-16: 40 ug/min via INTRAVENOUS

## 2021-12-16 MED ORDER — OXYCODONE HCL 5 MG PO TABS: 5.0000 mg | ORAL_TABLET | Freq: Once | ORAL | Status: DC | PRN

## 2021-12-16 MED ORDER — CEFAZOLIN IN SODIUM CHLORIDE 3-0.9 GM/100ML-% IV SOLN
3.0000 g | INTRAVENOUS | Status: AC
  Administered 2021-12-16: 3 g via INTRAVENOUS
  Filled 2021-12-16: qty 100

## 2021-12-16 MED ORDER — POVIDONE-IODINE 10 % EX SWAB
2.0000 | Freq: Once | CUTANEOUS | Status: AC
  Administered 2021-12-16: 2 via TOPICAL

## 2021-12-16 MED ORDER — FINERENONE 10 MG PO TABS: 10.0000 mg | ORAL_TABLET | Freq: Every day | ORAL | Status: DC

## 2021-12-16 MED ORDER — OXYCODONE HCL 5 MG PO TABS: 5.0000 mg | ORAL_TABLET | ORAL | Status: DC | PRN

## 2021-12-16 MED ORDER — MIDAZOLAM HCL 2 MG/2ML IJ SOLN
INTRAMUSCULAR | Status: AC
  Filled 2021-12-16: qty 2

## 2021-12-16 MED ORDER — HYDROMORPHONE HCL 1 MG/ML IJ SOLN: 0.2500 mg | INTRAMUSCULAR | Status: DC | PRN

## 2021-12-16 MED ORDER — PANTOPRAZOLE SODIUM 40 MG PO TBEC
40.0000 mg | DELAYED_RELEASE_TABLET | Freq: Every day | ORAL | Status: DC
  Administered 2021-12-16 – 2021-12-17 (×2): 40 mg via ORAL
  Filled 2021-12-16 (×2): qty 1

## 2021-12-16 MED ORDER — DAPAGLIFLOZIN PROPANEDIOL 5 MG PO TABS
5.0000 mg | ORAL_TABLET | Freq: Every day | ORAL | Status: DC
  Administered 2021-12-17: 5 mg via ORAL
  Filled 2021-12-16: qty 1

## 2021-12-16 MED ORDER — 0.9 % SODIUM CHLORIDE (POUR BTL) OPTIME
TOPICAL | Status: DC | PRN
  Administered 2021-12-16: 1000 mL

## 2021-12-16 MED ORDER — HYDROMORPHONE HCL 2 MG PO TABS
2.0000 mg | ORAL_TABLET | ORAL | Status: DC | PRN
  Administered 2021-12-16: 2 mg via ORAL
  Filled 2021-12-16: qty 1

## 2021-12-16 MED ORDER — SODIUM CHLORIDE FLUSH 0.9 % IV SOLN
INTRAVENOUS | Status: DC | PRN
  Administered 2021-12-16: 20 mL

## 2021-12-16 MED ORDER — TRANEXAMIC ACID-NACL 1000-0.7 MG/100ML-% IV SOLN
1000.0000 mg | Freq: Once | INTRAVENOUS | Status: AC
  Administered 2021-12-16: 1000 mg via INTRAVENOUS
  Filled 2021-12-16: qty 100

## 2021-12-16 MED ORDER — GABAPENTIN 300 MG PO CAPS
300.0000 mg | ORAL_CAPSULE | Freq: Every day | ORAL | Status: DC
Start: 2021-12-16 — End: 2021-12-17
  Administered 2021-12-16: 300 mg via ORAL
  Filled 2021-12-16: qty 1

## 2021-12-16 MED ORDER — VANCOMYCIN HCL IN DEXTROSE 1-5 GM/200ML-% IV SOLN
1000.0000 mg | Freq: Two times a day (BID) | INTRAVENOUS | Status: AC
  Administered 2021-12-16: 1000 mg via INTRAVENOUS
  Filled 2021-12-16: qty 200

## 2021-12-16 MED ORDER — ONDANSETRON HCL 4 MG PO TABS: 4.0000 mg | ORAL_TABLET | Freq: Four times a day (QID) | ORAL | Status: DC | PRN

## 2021-12-16 MED ORDER — BISACODYL 10 MG RE SUPP: 10.0000 mg | Freq: Every day | RECTAL | Status: DC | PRN

## 2021-12-16 MED ORDER — ORAL CARE MOUTH RINSE: 15.0000 mL | Freq: Once | OROMUCOSAL | Status: AC

## 2021-12-16 MED ORDER — ONDANSETRON HCL 4 MG/2ML IJ SOLN
INTRAMUSCULAR | Status: AC
  Filled 2021-12-16: qty 2

## 2021-12-16 MED ORDER — MIDAZOLAM HCL 5 MG/5ML IJ SOLN
INTRAMUSCULAR | Status: DC | PRN
  Administered 2021-12-16: 2 mg via INTRAVENOUS

## 2021-12-16 MED ORDER — POLYETHYLENE GLYCOL 3350 17 G PO PACK: 17.0000 g | PACK | Freq: Every day | ORAL | Status: DC | PRN

## 2021-12-16 MED ORDER — METOCLOPRAMIDE HCL 5 MG PO TABS: 5.0000 mg | ORAL_TABLET | Freq: Three times a day (TID) | ORAL | Status: DC | PRN

## 2021-12-16 MED ORDER — ACETAMINOPHEN 500 MG PO TABS
1000.0000 mg | ORAL_TABLET | Freq: Four times a day (QID) | ORAL | Status: AC
  Administered 2021-12-16 – 2021-12-17 (×4): 1000 mg via ORAL
  Filled 2021-12-16 (×4): qty 2

## 2021-12-16 MED ORDER — ADULT MULTIVITAMIN W/MINERALS CH
1.0000 | ORAL_TABLET | Freq: Every day | ORAL | Status: DC
  Administered 2021-12-17: 1 via ORAL
  Filled 2021-12-16: qty 1

## 2021-12-16 MED ORDER — DEXAMETHASONE SODIUM PHOSPHATE 10 MG/ML IJ SOLN
8.0000 mg | Freq: Once | INTRAMUSCULAR | Status: AC
  Administered 2021-12-16: 10 mg via INTRAVENOUS

## 2021-12-16 MED ORDER — DIPHENHYDRAMINE HCL 12.5 MG/5ML PO ELIX: 12.5000 mg | ORAL_SOLUTION | ORAL | Status: DC | PRN

## 2021-12-16 MED ORDER — TRANEXAMIC ACID-NACL 1000-0.7 MG/100ML-% IV SOLN
1000.0000 mg | INTRAVENOUS | Status: AC
  Administered 2021-12-16: 1000 mg via INTRAVENOUS
  Filled 2021-12-16: qty 100

## 2021-12-16 MED ORDER — METHOCARBAMOL 500 MG IVPB - SIMPLE MED: 500.0000 mg | Freq: Four times a day (QID) | INTRAVENOUS | Status: DC | PRN

## 2021-12-16 MED ORDER — BUPIVACAINE IN DEXTROSE 0.75-8.25 % IT SOLN
INTRATHECAL | Status: DC | PRN
  Administered 2021-12-16: 2 mL via INTRATHECAL

## 2021-12-16 MED ORDER — LATANOPROST 0.005 % OP SOLN
1.0000 [drp] | Freq: Every day | OPHTHALMIC | Status: DC
  Administered 2021-12-16: 1 [drp] via OPHTHALMIC
  Filled 2021-12-16: qty 2.5

## 2021-12-16 MED ORDER — ACETAMINOPHEN 500 MG PO TABS
1000.0000 mg | ORAL_TABLET | Freq: Once | ORAL | Status: DC
  Filled 2021-12-16: qty 2

## 2021-12-16 MED ORDER — CHLORHEXIDINE GLUCONATE 0.12 % MT SOLN
15.0000 mL | Freq: Once | OROMUCOSAL | Status: AC
  Administered 2021-12-16: 15 mL via OROMUCOSAL

## 2021-12-16 MED ORDER — LOSARTAN POTASSIUM 25 MG PO TABS
25.0000 mg | ORAL_TABLET | Freq: Every day | ORAL | Status: DC
  Administered 2021-12-17: 25 mg via ORAL
  Filled 2021-12-16: qty 1

## 2021-12-16 MED ORDER — FENTANYL CITRATE (PF) 100 MCG/2ML IJ SOLN
INTRAMUSCULAR | Status: AC
  Filled 2021-12-16: qty 2

## 2021-12-16 MED ORDER — TIMOLOL MALEATE 0.5 % OP SOLN
1.0000 [drp] | Freq: Every day | OPHTHALMIC | Status: DC
  Administered 2021-12-17: 1 [drp] via OPHTHALMIC
  Filled 2021-12-16: qty 5

## 2021-12-16 MED ORDER — SODIUM CHLORIDE (PF) 0.9 % IJ SOLN
INTRAMUSCULAR | Status: AC
  Filled 2021-12-16: qty 20

## 2021-12-16 MED ORDER — PHENYLEPHRINE HCL (PRESSORS) 10 MG/ML IV SOLN
INTRAVENOUS | Status: DC | PRN
  Administered 2021-12-16 (×2): 80 ug via INTRAVENOUS
  Administered 2021-12-16: 160 ug via INTRAVENOUS
  Administered 2021-12-16 (×2): 80 ug via INTRAVENOUS

## 2021-12-16 MED ORDER — ONDANSETRON HCL 4 MG/2ML IJ SOLN: 4.0000 mg | Freq: Once | INTRAMUSCULAR | Status: DC | PRN

## 2021-12-16 MED ORDER — POVIDONE-IODINE 10 % EX SWAB: 2.0000 "application " | Freq: Once | CUTANEOUS | Status: DC

## 2021-12-16 MED ORDER — DOCUSATE SODIUM 100 MG PO CAPS
100.0000 mg | ORAL_CAPSULE | Freq: Two times a day (BID) | ORAL | Status: DC
  Administered 2021-12-16 – 2021-12-17 (×2): 100 mg via ORAL
  Filled 2021-12-16 (×2): qty 1

## 2021-12-16 MED ORDER — PHENYLEPHRINE HCL-NACL 20-0.9 MG/250ML-% IV SOLN
INTRAVENOUS | Status: AC
  Filled 2021-12-16: qty 250

## 2021-12-16 MED ORDER — PROPOFOL 1000 MG/100ML IV EMUL
INTRAVENOUS | Status: AC
  Filled 2021-12-16: qty 100

## 2021-12-16 MED ORDER — ONDANSETRON HCL 4 MG/2ML IJ SOLN
4.0000 mg | Freq: Four times a day (QID) | INTRAMUSCULAR | Status: DC | PRN
  Administered 2021-12-17: 4 mg via INTRAVENOUS
  Filled 2021-12-16: qty 2

## 2021-12-16 MED ORDER — DEXAMETHASONE SODIUM PHOSPHATE 10 MG/ML IJ SOLN
INTRAMUSCULAR | Status: AC
  Filled 2021-12-16: qty 1

## 2021-12-16 MED ORDER — ALBUTEROL SULFATE HFA 108 (90 BASE) MCG/ACT IN AERS
INHALATION_SPRAY | RESPIRATORY_TRACT | Status: AC
  Filled 2021-12-16: qty 6.7

## 2021-12-16 MED ORDER — DEXAMETHASONE SODIUM PHOSPHATE 10 MG/ML IJ SOLN
10.0000 mg | Freq: Once | INTRAMUSCULAR | Status: AC
  Administered 2021-12-17: 10 mg via INTRAVENOUS
  Filled 2021-12-16: qty 1

## 2021-12-16 MED ORDER — ASPIRIN 81 MG PO TBEC
81.0000 mg | DELAYED_RELEASE_TABLET | Freq: Every day | ORAL | Status: DC
  Administered 2021-12-17: 81 mg via ORAL
  Filled 2021-12-16: qty 1

## 2021-12-16 MED ORDER — HYDROMORPHONE HCL 1 MG/ML IJ SOLN: 0.5000 mg | INTRAMUSCULAR | Status: DC | PRN

## 2021-12-16 MED ORDER — METHOCARBAMOL 500 MG PO TABS
500.0000 mg | ORAL_TABLET | Freq: Four times a day (QID) | ORAL | Status: DC | PRN
  Administered 2021-12-16 – 2021-12-17 (×4): 500 mg via ORAL
  Filled 2021-12-16 (×4): qty 1

## 2021-12-16 MED ORDER — ACETAMINOPHEN 325 MG PO TABS
325.0000 mg | ORAL_TABLET | Freq: Four times a day (QID) | ORAL | Status: DC | PRN
  Administered 2021-12-17: 650 mg via ORAL
  Filled 2021-12-16: qty 2

## 2021-12-16 MED ORDER — VITAMIN B-12 1000 MCG PO TABS
1000.0000 ug | ORAL_TABLET | Freq: Every day | ORAL | Status: DC
  Administered 2021-12-17: 1000 ug via ORAL
  Filled 2021-12-16: qty 1

## 2021-12-16 MED ORDER — BUPIVACAINE LIPOSOME 1.3 % IJ SUSP
INTRAMUSCULAR | Status: DC | PRN
  Administered 2021-12-16: 10 mL

## 2021-12-16 MED ORDER — BUPIVACAINE LIPOSOME 1.3 % IJ SUSP
INTRAMUSCULAR | Status: AC
Start: 2021-12-16 — End: ?
  Filled 2021-12-16: qty 10

## 2021-12-16 MED ORDER — PHENOL 1.4 % MT LIQD
1.0000 | OROMUCOSAL | Status: DC | PRN
Start: 2021-12-16 — End: 2021-12-17

## 2021-12-16 MED ORDER — BUPIVACAINE LIPOSOME 1.3 % IJ SUSP: 10.0000 mL | Freq: Once | INTRAMUSCULAR | Status: DC

## 2021-12-16 MED ORDER — INSULIN ASPART 100 UNIT/ML IJ SOLN
0.0000 [IU] | Freq: Three times a day (TID) | INTRAMUSCULAR | Status: DC
  Administered 2021-12-16: 8 [IU] via SUBCUTANEOUS
  Administered 2021-12-17: 3 [IU] via SUBCUTANEOUS

## 2021-12-16 MED ORDER — FENTANYL CITRATE (PF) 100 MCG/2ML IJ SOLN
INTRAMUSCULAR | Status: DC | PRN
  Administered 2021-12-16 (×2): 50 ug via INTRAVENOUS

## 2021-12-16 MED ORDER — TRAMADOL HCL 50 MG PO TABS
50.0000 mg | ORAL_TABLET | Freq: Four times a day (QID) | ORAL | Status: DC
  Administered 2021-12-17 (×3): 50 mg via ORAL
  Filled 2021-12-16 (×3): qty 1

## 2021-12-16 MED ORDER — OXYCODONE HCL 5 MG PO TABS
20.0000 mg | ORAL_TABLET | Freq: Four times a day (QID) | ORAL | Status: DC | PRN
  Administered 2021-12-16 (×2): 20 mg via ORAL
  Filled 2021-12-16 (×3): qty 4

## 2021-12-16 MED ORDER — PROPOFOL 500 MG/50ML IV EMUL
INTRAVENOUS | Status: DC | PRN
  Administered 2021-12-16: 30 mg via INTRAVENOUS
  Administered 2021-12-16: 75 ug/kg/min via INTRAVENOUS

## 2021-12-16 SURGICAL SUPPLY — 48 items
BAG COUNTER SPONGE SURGICOUNT (BAG) ×1 IMPLANT
BAG ZIPLOCK 12X15 (MISCELLANEOUS) ×1 IMPLANT
BLADE SAG 18X100X1.27 (BLADE) ×2 IMPLANT
BLADE SURG SZ10 CARB STEEL (BLADE) ×2 IMPLANT
CATH FOLEY 2WAY SLVR  5CC 14FR (CATHETERS) ×2
CATH FOLEY 2WAY SLVR 5CC 14FR (CATHETERS) IMPLANT
CHLORAPREP W/TINT 26 (MISCELLANEOUS) ×2 IMPLANT
CLSR STERI-STRIP ANTIMIC 1/2X4 (GAUZE/BANDAGES/DRESSINGS) ×2 IMPLANT
COVER PERINEAL POST (MISCELLANEOUS) ×2 IMPLANT
COVER SURGICAL LIGHT HANDLE (MISCELLANEOUS) ×2 IMPLANT
DRAPE IMP U-DRAPE 54X76 (DRAPES) ×2 IMPLANT
DRAPE STERI IOBAN 125X83 (DRAPES) ×2 IMPLANT
DRAPE U-SHAPE 47X51 STRL (DRAPES) ×4 IMPLANT
DRSG MEPILEX BORDER 4X8 (GAUZE/BANDAGES/DRESSINGS) ×2 IMPLANT
DRSG MEPILEX POST OP 4X8 (GAUZE/BANDAGES/DRESSINGS) ×1 IMPLANT
ELECT REM PT RETURN 15FT ADLT (MISCELLANEOUS) ×2 IMPLANT
GLOVE BIO SURGEON STRL SZ7.5 (GLOVE) ×2 IMPLANT
GLOVE BIOGEL PI IND STRL 7.5 (GLOVE) ×1 IMPLANT
GLOVE BIOGEL PI IND STRL 8 (GLOVE) ×1 IMPLANT
GLOVE BIOGEL PI INDICATOR 7.5 (GLOVE) ×1
GLOVE BIOGEL PI INDICATOR 8 (GLOVE) ×1
GLOVE SURG SYN 7.5  E (GLOVE) ×2
GLOVE SURG SYN 7.5 E (GLOVE) ×1 IMPLANT
GLOVE SURG SYN 7.5 PF PI (GLOVE) ×1 IMPLANT
GOWN SPEC L4 XLG W/TWL (GOWN DISPOSABLE) ×2 IMPLANT
GOWN STRL REUS W/ TWL LRG LVL3 (GOWN DISPOSABLE) ×1 IMPLANT
GOWN STRL REUS W/TWL LRG LVL3 (GOWN DISPOSABLE) ×2
HEAD BIOLOX HIP 36/-5 (Joint) IMPLANT
HIP BIOLOX HD 36/-5 (Joint) ×2 IMPLANT
HOLDER FOLEY CATH W/STRAP (MISCELLANEOUS) ×1 IMPLANT
INSERT TRIDENT POLY 36 0DEG (Insert) ×1 IMPLANT
KIT TURNOVER KIT A (KITS) IMPLANT
MANIFOLD NEPTUNE II (INSTRUMENTS) ×2 IMPLANT
NS IRRIG 1000ML POUR BTL (IV SOLUTION) ×2 IMPLANT
PACK ANTERIOR HIP CUSTOM (KITS) ×2 IMPLANT
PROTECTOR NERVE ULNAR (MISCELLANEOUS) ×2 IMPLANT
SCREW HEX LP 6.5X20 (Screw) ×1 IMPLANT
SHELL CLUSTERHOLE ACETABULAR 5 (Shell) ×1 IMPLANT
SPIKE FLUID TRANSFER (MISCELLANEOUS) ×2 IMPLANT
STEM HIP 4 127DEG (Stem) ×1 IMPLANT
SUT MNCRL AB 3-0 PS2 18 (SUTURE) ×2 IMPLANT
SUT VIC AB 0 CT1 36 (SUTURE) ×2 IMPLANT
SUT VIC AB 1 CT1 36 (SUTURE) ×2 IMPLANT
SUT VIC AB 2-0 CT1 27 (SUTURE) ×4
SUT VIC AB 2-0 CT1 TAPERPNT 27 (SUTURE) ×2 IMPLANT
TRAY FOLEY MTR SLVR 16FR STAT (SET/KITS/TRAYS/PACK) ×1 IMPLANT
TUBE SUCTION HIGH CAP CLEAR NV (SUCTIONS) ×2 IMPLANT
WATER STERILE IRR 1000ML POUR (IV SOLUTION) ×4 IMPLANT

## 2021-12-16 NOTE — Anesthesia Procedure Notes (Signed)
Spinal  Patient location during procedure: OR Start time: 12/16/2021 7:27 AM End time: 12/16/2021 7:31 AM Reason for block: surgical anesthesia Staffing Performed: anesthesiologist  Anesthesiologist: Josephine Igo, MD Performed by: Josephine Igo, MD Authorized by: Josephine Igo, MD   Preanesthetic Checklist Completed: patient identified, IV checked, site marked, risks and benefits discussed, surgical consent, monitors and equipment checked, pre-op evaluation and timeout performed Spinal Block Patient position: sitting Prep: DuraPrep and site prepped and draped Patient monitoring: heart rate, cardiac monitor, continuous pulse ox and blood pressure Approach: midline Location: L3-4 Injection technique: single-shot Needle Needle type: Pencan  Needle gauge: 24 G Needle length: 9 cm Needle insertion depth: 7 cm Assessment Sensory level: T4 Events: CSF return Additional Notes Patient tolerated procedure well. Adequate sensory level.

## 2021-12-16 NOTE — Discharge Instructions (Addendum)
POST-OPERATIVE OPIOID TAPER INSTRUCTIONS: It is important to wean off of your opioid medication as soon as possible. If you do not need pain medication after your surgery it is ok to stop day one. Opioids include: Codeine, Hydrocodone(Norco, Vicodin), Oxycodone(Percocet, oxycontin) and hydromorphone amongst others.  Long term and even short term use of opiods can cause: Increased pain response Dependence Constipation Depression Respiratory depression And more.  Withdrawal symptoms can include Flu like symptoms Nausea, vomiting And more Techniques to manage these symptoms Hydrate well Eat regular healthy meals Stay active Use relaxation techniques(deep breathing, meditating, yoga) Do Not substitute Alcohol to help with tapering If you have been on opioids for less than two weeks and do not have pain than it is ok to stop all together.  Plan to wean off of opioids This plan should start within one week post op of your joint replacement. Maintain the same interval or time between taking each dose and first decrease the dose.  Cut the total daily intake of opioids by one tablet each day Next start to increase the time between doses. The last dose that should be eliminated is the evening dose.     Information on my medicine - XARELTO (Rivaroxaban)  Why was Xarelto prescribed for you? Xarelto was prescribed for you to reduce the risk of blood clots forming after orthopedic surgery. The medical term for these abnormal blood clots is venous thromboembolism (VTE).  What do you need to know about xarelto ? Take your Xarelto ONCE DAILY at the same time every day. You may take it either with or without food.  If you have difficulty swallowing the tablet whole, you may crush it and mix in applesauce just prior to taking your dose.  Take Xarelto exactly as prescribed by your doctor and DO NOT stop taking Xarelto without talking to the doctor who prescribed the medication.  Stopping  without other VTE prevention medication to take the place of Xarelto may increase your risk of developing a clot.  After discharge, you should have regular check-up appointments with your healthcare provider that is prescribing your Xarelto.    What do you do if you miss a dose? If you miss a dose, take it as soon as you remember on the same day then continue your regularly scheduled once daily regimen the next day. Do not take two doses of Xarelto on the same day.   Important Safety Information A possible side effect of Xarelto is bleeding. You should call your healthcare provider right away if you experience any of the following: Bleeding from an injury or your nose that does not stop. Unusual colored urine (red or dark brown) or unusual colored stools (red or black). Unusual bruising for unknown reasons. A serious fall or if you hit your head (even if there is no bleeding).  Some medicines may interact with Xarelto and might increase your risk of bleeding while on Xarelto. To help avoid this, consult your healthcare provider or pharmacist prior to using any new prescription or non-prescription medications, including herbals, vitamins, non-steroidal anti-inflammatory drugs (NSAIDs) and supplements.  This website has more information on Xarelto: www.xarelto.com.   

## 2021-12-16 NOTE — Interval H&P Note (Signed)
History and Physical Interval Note:  12/16/2021 7:08 AM  [name]  has presented today for surgery, with the diagnosis of OA LEFT HIP.  The various methods of treatment have been discussed with the patient and family. After consideration of risks, benefits and other options for treatment, the patient has consented to  Procedure(s): TOTAL HIP ARTHROPLASTY ANTERIOR APPROACH (Left) as a surgical intervention.  The patient's history has been reviewed, patient examined, no change in status, stable for surgery.  I have reviewed the patient's chart and labs.  Questions were answered to the patient's satisfaction.     Renette Butters

## 2021-12-16 NOTE — Op Note (Signed)
12/16/2021  9:08 AM  PATIENT:  [name]   MRN: [id number]  PRE-OPERATIVE DIAGNOSIS:  OA LEFT HIP  POST-OPERATIVE DIAGNOSIS:  OA LEFT HIP  PROCEDURE:  Procedure(s): TOTAL HIP ARTHROPLASTY ANTERIOR APPROACH  PREOPERATIVE INDICATIONS:    [name] is an 51 y.o. male who has a diagnosis of <principal problem not specified> and elected for surgical management after failing conservative treatment.  The risks benefits and alternatives were discussed with the patient including but not limited to the risks of nonoperative treatment, versus surgical intervention including infection, bleeding, nerve injury, periprosthetic fracture, the need for revision surgery, dislocation, leg length discrepancy, blood clots, cardiopulmonary complications, morbidity, mortality, among others, and they were willing to proceed.     OPERATIVE REPORT     SURGEON:   Renette Butters, MD    ASSISTANT:  [name], PA-C, he was present and scrubbed throughout the case, critical for completion in a timely fashion, and for retraction, instrumentation, and closure.     ANESTHESIA:  General    COMPLICATIONS:  None.     COMPONENTS:  [employer]ker acolade fit femur size 4 with a 36 mm -5 head ball and an acetabular shell size 52 with a  polyethylene liner    PROCEDURE IN DETAIL:   The patient was met in the holding area and  identified.  The appropriate hip was identified and marked at the operative site.  The patient was then transported to the OR  and  placed under anesthesia per that record.  At that point, the patient was  placed in the supine position and  secured to the operating room table and all bony prominences padded. He received pre-operative antibiotics    The operative lower extremity was prepped from the iliac crest to the distal leg.  Sterile draping was performed.  Time out was performed prior to incision.      Skin incision was made just 2 cm lateral to the ASIS  extending in line with the  tensor fascia lata. Electrocautery was used to control all bleeders. I dissected down sharply to the fascia of the tensor fascia lata was confirmed that the muscle fibers beneath were running posteriorly. I then incised the fascia over the superficial tensor fascia lata in line with the incision. The fascia was elevated off the anterior aspect of the muscle the muscle was retracted posteriorly and protected throughout the case. I then used electrocautery to incise the tensor fascia lata fascia control and all bleeders. Immediately visible was the fat over top of the anterior neck and capsule.  I removed the anterior fat from the capsule and elevated the rectus muscle off of the anterior capsule. I then removed a large time of capsule. The retractors were then placed over the anterior acetabulum as well as around the superior and inferior neck.  I then made a femoral neck cut. Then used the power corkscrew to remove the femoral head from the acetabulum and thoroughly irrigated the acetabulum. I sized the femoral head.    I then exposed the deep acetabulum, cleared out any tissue including the ligamentum teres.   After adequate visualization, I excised the labrum, and then sequentially reamed.  I then impacted the acetabular implant into place using fluoroscopy for guidance.  Appropriate version and inclination was confirmed clinically matching their bony anatomy, and with fluoroscopy.  I placed a 20 mm screw in the posterior/superio position with an excellent bite.    I then placed the polyethylene liner in  place  I then adducted the leg and released the external rotators from the posterior femur allowing it to be easily delivered up lateral and anterior to the acetabulum for preparation of the femoral canal.    I then prepared the proximal femur using the cookie-cutter and then sequentially reamed and broached.  A trial broach, neck, and head was utilized, and I reduced the hip and used floroscopy to  assess the neck length and femoral implant.  I then impacted the femoral prosthesis into place into the appropriate version. The hip was then reduced and fluoroscopy confirmed appropriate position. Leg lengths were restored.  I then irrigated the hip copiously again with, and repaired the fascia with Vicryl, followed by monocryl for the subcutaneous tissue, Monocryl for the skin, Steri-Strips and sterile gauze. The patient was then awakened and returned to PACU in stable and satisfactory condition. There were no complications.  POST OPERATIVE PLAN: WBAT, DVT px: SCD's/TED, ambulation and chemical dvt px  Myonna Chisom, MD Orthopedic Surgeon [phone]     

## 2021-12-16 NOTE — Transfer of Care (Signed)
Immediate Anesthesia Transfer of Care Note  Patient: [name]  Procedure(s) Performed: TOTAL HIP ARTHROPLASTY ANTERIOR APPROACH (Left: Hip)  Patient Location: PACU  Anesthesia Type:Spinal  Level of Consciousness: awake  Airway & Oxygen Therapy: Patient Spontanous Breathing  Post-op Assessment: Report given to RN  Post vital signs: stable  Last Vitals:  Vitals Value Taken Time  BP 135/45 12/16/21 0937  Temp 36.5 C 12/16/21 0937  Pulse 81 12/16/21 0937  Resp 17 12/16/21 0937  SpO2      Last Pain:  Vitals:   12/16/21 0602  PainSc: 0-No pain      Patients Stated Pain Goal: 3 (75/64/33 2951)  Complications: No notable events documented.

## 2021-12-16 NOTE — Evaluation (Signed)
Physical Therapy Evaluation Patient Details Name: [name] MRN: [id number] DOB: 04/06/1971 Today's Date: 12/16/2021  History of Present Illness  51 yo male s/p L THA-DA 7/25.  Clinical Impression  On eval POD 0, pt required Min A for mobility. He walked ~60 feet with a RW. Mild-Mod pain with activity. Will follow and progress activity as tolerated. Plan for d/c on tomorrow if pt continues to progress and well and is cleared by surgeon/PA.         Recommendations for follow up therapy are one component of a multi-disciplinary discharge planning process, led by the attending physician.  Recommendations may be updated based on patient status, additional functional criteria and insurance authorization.  Follow Up Recommendations Follow physician's recommendations for discharge plan and follow up therapies      Assistance Recommended at Discharge Intermittent Supervision/Assistance  Patient can return home with the following  A little help with walking and/or transfers;A little help with bathing/dressing/bathroom;Assistance with cooking/housework;Assist for transportation;Help with stairs or ramp for entrance    Equipment Recommendations Rolling walker (2 wheels)  Recommendations for Other Services       Functional Status Assessment Patient has had a recent decline in their functional status and demonstrates the ability to make significant improvements in function in a reasonable and predictable amount of time.     Precautions / Restrictions Precautions Precautions: Fall Restrictions Weight Bearing Restrictions: No LLE Weight Bearing: Weight bearing as tolerated      Mobility  Bed Mobility Overal bed mobility: Needs Assistance Bed Mobility: Supine to Sit     Supine to sit: Supervision, HOB elevated     General bed mobility comments: Supv for safety. Increased time. Cues provided.    Transfers Overall transfer level: Needs assistance Equipment used: Rolling walker (2  wheels) Transfers: Sit to/from Stand Sit to Stand: Min assist, From elevated surface           General transfer comment: Assist to rise, steady, control descent. Cues for safety, hand placement.    Ambulation/Gait Ambulation/Gait assistance: Min assist Gait Distance (Feet): 60 Feet Assistive device: Rolling walker (2 wheels) Gait Pattern/deviations: Step-to pattern       General Gait Details: Cues for safety, sequence, proper use of RW. Slow but steady gait. Pt denied dizziness. Tolerated distance well.  Stairs            Wheelchair Mobility    Modified Rankin (Stroke Patients Only)       Balance Overall balance assessment: Needs assistance         Standing balance support: Bilateral upper extremity supported, Reliant on assistive device for balance, During functional activity Standing balance-Leahy Scale: Fair                               Pertinent Vitals/Pain Pain Assessment Pain Assessment: 0-10 Pain Score: 3  Pain Location: L hip/thigh Pain Descriptors / Indicators: Discomfort, Sore Pain Intervention(s): Monitored during session, Repositioned    Home Living Family/patient expects to be discharged to:: Private residence Living Arrangements: Parent Available Help at Discharge: Available PRN/intermittently Type of Home: Apartment Home Access: Level entry       Home Layout: One level Home Equipment: Rollator (4 wheels)      Prior Function Prior Level of Function : Independent/Modified Independent                     Hand Dominance  Extremity/Trunk Assessment   Upper Extremity Assessment Upper Extremity Assessment: Overall WFL for tasks assessed    Lower Extremity Assessment Lower Extremity Assessment: Generalized weakness    Cervical / Trunk Assessment Cervical / Trunk Assessment: Normal  Communication   Communication: No difficulties  Cognition Arousal/Alertness: Awake/alert Behavior During Therapy:  WFL for tasks assessed/performed Overall Cognitive Status: Within Functional Limits for tasks assessed                                          General Comments      Exercises     Assessment/Plan    PT Assessment Patient needs continued PT services  PT Problem List Decreased strength;Decreased balance;Decreased range of motion;Decreased activity tolerance;Decreased mobility;Decreased knowledge of use of DME       PT Treatment Interventions DME instruction;Gait training;Functional mobility training;Therapeutic activities;Balance training;Patient/family education;Stair training;Therapeutic exercise    PT Goals (Current goals can be found in the Care Plan section)  Acute Rehab PT Goals Patient Stated Goal: home soon. regain PLOF/independence PT Goal Formulation: With patient Time For Goal Achievement: 12/30/21 Potential to Achieve Goals: Good    Frequency 7X/week     Co-evaluation               AM-PAC PT "6 Clicks" Mobility  Outcome Measure Help needed turning from your back to your side while in a flat bed without using bedrails?: A Little Help needed moving from lying on your back to sitting on the side of a flat bed without using bedrails?: A Little Help needed moving to and from a bed to a chair (including a wheelchair)?: A Little Help needed standing up from a chair using your arms (e.g., wheelchair or bedside chair)?: A Little Help needed to walk in hospital room?: A Little Help needed climbing 3-5 steps with a railing? : A Little 6 Click Score: 18    End of Session Equipment Utilized During Treatment: Gait belt Activity Tolerance: Patient tolerated treatment well Patient left: in chair;with call bell/phone within reach   PT Visit Diagnosis: Pain;Other abnormalities of gait and mobility (R26.89) Pain - Right/Left: Left Pain - part of body: Hip    Time: 2297-9892 PT Time Calculation (min) (ACUTE ONLY): 15 min   Charges:   PT  Evaluation $PT Eval Low Complexity: [place], PT Acute Rehabilitation  Office: [phone] Pager: [phone]

## 2021-12-16 NOTE — Anesthesia Postprocedure Evaluation (Signed)
Anesthesia Post Note  Patient: [name]  Procedure(s) Performed: TOTAL HIP ARTHROPLASTY ANTERIOR APPROACH (Left: Hip)     Patient location during evaluation: PACU Anesthesia Type: Spinal Level of consciousness: oriented and awake and alert Pain management: pain level controlled Vital Signs Assessment: post-procedure vital signs reviewed and stable Respiratory status: spontaneous breathing, respiratory function stable and nonlabored ventilation Cardiovascular status: blood pressure returned to baseline and stable Postop Assessment: no headache, no backache, no apparent nausea or vomiting, spinal receding and patient able to bend at knees Anesthetic complications: no   No notable events documented.  Last Vitals:  Vitals:   12/16/21 1000 12/16/21 1015  BP: 119/76 135/72  Pulse: 80 84  Resp: 20 12  Temp:    SpO2: 98% 99%    Last Pain:  Vitals:   12/16/21 1000  PainSc: 0-No pain                 Jeret Goyer A.

## 2021-12-17 ENCOUNTER — Encounter (HOSPITAL_COMMUNITY): Payer: Self-pay | Admitting: Orthopedic Surgery

## 2021-12-17 DIAGNOSIS — Z96642 Presence of left artificial hip joint: Secondary | ICD-10-CM | POA: Diagnosis not present

## 2021-12-17 DIAGNOSIS — M1612 Unilateral primary osteoarthritis, left hip: Secondary | ICD-10-CM | POA: Diagnosis not present

## 2021-12-17 LAB — GLUCOSE, CAPILLARY
Glucose-Capillary: 155 mg/dL — ABNORMAL HIGH (ref 70–99)
Glucose-Capillary: 119 mg/dL — ABNORMAL HIGH (ref 70–99)
Glucose-Capillary: 149 mg/dL — ABNORMAL HIGH (ref 70–99)

## 2021-12-17 MED ORDER — ACETAMINOPHEN 500 MG PO TABS: 1000.0000 mg | ORAL_TABLET | Freq: Four times a day (QID) | ORAL | 0 refills | Status: DC | PRN

## 2021-12-17 MED ORDER — RIVAROXABAN 10 MG PO TABS: 10.0000 mg | ORAL_TABLET | Freq: Every day | ORAL | 0 refills | Status: DC

## 2021-12-17 MED ORDER — METHOCARBAMOL 750 MG PO TABS: 750.0000 mg | ORAL_TABLET | Freq: Three times a day (TID) | ORAL | 0 refills | Status: DC | PRN

## 2021-12-17 MED ORDER — ONDANSETRON 4 MG PO TBDP: 4.0000 mg | ORAL_TABLET | Freq: Two times a day (BID) | ORAL | 0 refills | Status: DC | PRN

## 2021-12-17 MED ORDER — POLYETHYLENE GLYCOL 3350 17 G PO PACK: 17.0000 g | PACK | Freq: Every day | ORAL | 0 refills | Status: DC

## 2021-12-17 MED ORDER — OXYCODONE HCL 10 MG PO TABS: 5.0000 mg | ORAL_TABLET | Freq: Three times a day (TID) | ORAL | 0 refills | Status: DC | PRN

## 2021-12-17 NOTE — Discharge Summary (Signed)
Physician Discharge Summary  Patient ID: [name] MRN: [id number] DOB/AGE: 1971/04/15 51 y.o.  Admit date: 12/16/2021 Discharge date: 12/17/2021  Admission Diagnoses:  Discharge Diagnoses:  Principal Problem:   S/P total left hip arthroplasty   Discharged Condition: fair  Hospital Course: Patient underwent a left THA by Dr. Percell Miller on 09/05/22 without complications. He spent the night in observation for pain control and mobilization. He has passed his PT evaluation and is ready for discharge home with OPPT already set up.  Consults: None  Significant Diagnostic Studies: n/a  Treatments: IV hydration, antibiotics: Ancef and vancomycin, analgesia: acetaminophen, Dilaudid, Morphine, and Oxycodone, anticoagulation: ASA and Xarelto, therapies: PT and OT, and surgery: left THA  Discharge Exam: Blood pressure (!) 113/56, pulse 87, temperature (!) 97.5 F (36.4 C), resp. rate 18, height 5' 8.9" (1.75 m), weight 119 kg, SpO2 96 %. General appearance: alert, cooperative, and no distress Head: Normocephalic, without obvious abnormality, atraumatic Resp: clear to auscultation bilaterally Cardio: regular rate and rhythm, S1, S2 normal, no murmur, click, rub or gallop GI: soft, non-tender; bowel sounds normal; no masses,  no organomegaly Extremities: extremities normal, atraumatic, no cyanosis or edema Pulses:  L brachial 2+ R brachial 2+  L radial 2+ R radial 2+  L inguinal 2+ R inguinal 2+  L popliteal 2+ R popliteal 2+  L posterior tibial 2+ R posterior tibial 2+  L dorsalis pedis 2+ R dorsalis pedis 2+   Neurologic: Grossly normal Incision/Wound: c/d/i  Disposition: Discharge disposition: 01-Home or Self Care       Discharge Instructions     Call MD / Call 911   Complete by: As directed    If you experience chest pain or shortness of breath, CALL 911 and be transported to the hospital emergency room.  If you develope a fever above 101 F, pus (white drainage) or  increased drainage or redness at the wound, or calf pain, call your surgeon's office.   Diet - low sodium heart healthy   Complete by: As directed    Discharge instructions   Complete by: As directed    You may bear weight as tolerated. Keep your dressing on and dry until follow up. Take medicine to prevent blood clots as directed. Take pain medicine as needed with the goal of transitioning to over the counter medicines.    INSTRUCTIONS AFTER JOINT REPLACEMENT   Remove items at home which could result in a fall. This includes throw rugs or furniture in walking pathways ICE to the affected joint every three hours while awake for 30 minutes at a time, for at least the first 3-5 days, and then as needed for pain and swelling.  Continue to use ice for pain and swelling. You may notice swelling that will progress down to the foot and ankle.  This is normal after surgery.  Elevate your leg when you are not up walking on it.   Continue to use the breathing machine you got in the hospital (incentive spirometer) which will help keep your temperature down.  It is common for your temperature to cycle up and down following surgery, especially at night when you are not up moving around and exerting yourself.  The breathing machine keeps your lungs expanded and your temperature down.   DIET:  As you were doing prior to hospitalization, we recommend a well-balanced diet.  DRESSING / WOUND CARE / SHOWERING  You may shower 3 days after surgery, but keep the wounds dry during showering.  You may use an occlusive plastic wrap (Press'n Seal for example) with blue painter's tape at edges, NO SOAKING/SUBMERGING IN THE BATHTUB.  If the bandage gets wet, call the office.   ACTIVITY  Increase activity slowly as tolerated, but follow the weight bearing instructions below.   No driving for 6 weeks or until further direction given by your physician.  You cannot drive while taking narcotics.  No lifting or carrying  greater than 10 lbs. until further directed by your surgeon. Avoid periods of inactivity such as sitting longer than an hour when not asleep. This helps prevent blood clots.  You may return to work once you are authorized by your doctor.    WEIGHT BEARING   Weight bearing as tolerated with assist device (walker, cane, etc) as directed, use it as long as suggested by your surgeon or therapist, typically at least 4-6 weeks.   EXERCISES  Results after joint replacement surgery are often greatly improved when you follow the exercise, range of motion and muscle strengthening exercises prescribed by your doctor. Safety measures are also important to protect the joint from further injury. Any time any of these exercises cause you to have increased pain or swelling, decrease what you are doing until you are comfortable again and then slowly increase them. If you have problems or questions, call your caregiver or physical therapist for advice.   Rehabilitation is important following a joint replacement. After just a few days of immobilization, the muscles of the leg can become weakened and shrink (atrophy).  These exercises are designed to build up the tone and strength of the thigh and leg muscles and to improve motion. Often times heat used for twenty to thirty minutes before working out will loosen up your tissues and help with improving the range of motion but do not use heat for the first two weeks following surgery (sometimes heat can increase post-operative swelling).   These exercises can be done on a training (exercise) mat, on the floor, on a table or on a bed. Use whatever works the best and is most comfortable for you.    Use music or television while you are exercising so that the exercises are a pleasant break in your day. This will make your life better with the exercises acting as a break in your routine that you can look forward to.   Perform all exercises about fifteen times, three times per  day or as directed.  You should exercise both the operative leg and the other leg as well.  Exercises include:   Quad Sets - Tighten up the muscle on the front of the thigh (Quad) and hold for 5-10 seconds.   Straight Leg Raises - With your knee straight (if you were given a brace, keep it on), lift the leg to 60 degrees, hold for 3 seconds, and slowly lower the leg.  Perform this exercise against resistance later as your leg gets stronger.  Leg Slides: Lying on your back, slowly slide your foot toward your buttocks, bending your knee up off the floor (only go as far as is comfortable). Then slowly slide your foot back down until your leg is flat on the floor again.  Angel Wings: Lying on your back spread your legs to the side as far apart as you can without causing discomfort.  Hamstring Strength:  Lying on your back, push your heel against the floor with your leg straight by tightening up the muscles of your buttocks.  Repeat, but  this time bend your knee to a comfortable angle, and push your heel against the floor.  You may put a pillow under the heel to make it more comfortable if necessary.   A rehabilitation program following joint replacement surgery can speed recovery and prevent re-injury in the future due to weakened muscles. Contact your doctor or a physical therapist for more information on knee rehabilitation.    CONSTIPATION  Constipation is defined medically as fewer than three stools per week and severe constipation as less than one stool per week.  Even if you have a regular bowel pattern at home, your normal regimen is likely to be disrupted due to multiple reasons following surgery.  Combination of anesthesia, postoperative narcotics, change in appetite and fluid intake all can affect your bowels.   YOU MUST use at least one of the following options; they are listed in order of increasing strength to get the job done.  They are all available over the counter, and you may need to  use some, POSSIBLY even all of these options:    Drink plenty of fluids (prune juice may be helpful) and high fiber foods Colace 100 mg by mouth twice a day  Senokot for constipation as directed and as needed Dulcolax (bisacodyl), take with full glass of water  Miralax (polyethylene glycol) once or twice a day as needed.  If you have tried all these things and are unable to have a bowel movement in the first 3-4 days after surgery call either your surgeon or your primary doctor.    If you experience loose stools or diarrhea, hold the medications until you stool forms back up.  If your symptoms do not get better within 1 week or if they get worse, check with your doctor.  If you experience "the worst abdominal pain ever" or develop nausea or vomiting, please contact the office immediately for further recommendations for treatment.   ITCHING:  If you experience itching with your medications, try taking only a single pain pill, or even half a pain pill at a time.  You can also use Benadryl over the counter for itching or also to help with sleep.   TED HOSE STOCKINGS:  Use stockings on both legs until for at least 2 weeks or as directed by physician office. They may be removed at night for sleeping.  MEDICATIONS:  See your medication summary on the "After Visit Summary" that nursing will review with you.  You may have some home medications which will be placed on hold until you complete the course of blood thinner medication.  It is important for you to complete the blood thinner medication as prescribed.  Take medicines as prescribed.   You have several different medicines that work in different ways. - Tylenol is for mild to moderate pain. Try to take this medicine before turning to your narcotic medicines.  - Robaxin is for muscle spasms. This medicine can make you drowsy. - Oxycodone is a narcotic pain medicine.  Take this for severe pain. This medicine can be dehydrating / constipating. -  Zofran is for nausea and vomiting. - Miralax is for constipation prevention.   - Xarelto is to prevent blood clots after surgery. YOU MUST TAKE THIS MEDICINE!  PRECAUTIONS:  If you experience chest pain or shortness of breath - call 911 immediately for transfer to the hospital emergency department.   If you develop a fever greater that 101 F, purulent drainage from wound, increased redness or drainage from wound,  foul odor from the wound/dressing, or calf pain - CONTACT YOUR SURGEON.                                                   FOLLOW-UP APPOINTMENTS:  If you do not already have a post-op appointment, please call the office [phone] for an appointment to be seen by Dr. Percell Miller in 2 weeks.   OTHER INSTRUCTIONS:   MAKE SURE YOU:  Understand these instructions.  Get help right away if you are not doing well or get worse.    Thank you for letting us be a part of your medical care team.  It is a privilege we respect greatly.  We hope these instructions will help you stay on track for a fast and full recovery!   Driving restrictions   Complete by: As directed    No driving for 2 weeks   Post-operative opioid taper instructions:   Complete by: As directed    POST-OPERATIVE OPIOID TAPER INSTRUCTIONS: It is important to wean off of your opioid medication as soon as possible. If you do not need pain medication after your surgery it is ok to stop day one. Opioids include: Codeine, Hydrocodone(Norco, Vicodin), Oxycodone(Percocet, oxycontin) and hydromorphone amongst others.  Long term and even short term use of opiods can cause: Increased pain response Dependence Constipation Depression Respiratory depression And more.  Withdrawal symptoms can include Flu like symptoms Nausea, vomiting And more Techniques to manage these symptoms Hydrate well Eat regular healthy meals Stay active Use relaxation techniques(deep breathing, meditating, yoga) Do Not substitute Alcohol to help with  tapering If you have been on opioids for less than two weeks and do not have pain than it is ok to stop all together.  Plan to wean off of opioids This plan should start within one week post op of your joint replacement. Maintain the same interval or time between taking each dose and first decrease the dose.  Cut the total daily intake of opioids by one tablet each day Next start to increase the time between doses. The last dose that should be eliminated is the evening dose.      TED hose   Complete by: As directed    Use stockings (TED hose) for 2 weeks on left leg(s).  You may remove them at night for sleeping.   Weight bearing as tolerated   Complete by: As directed       Allergies as of 12/17/2021   No Known Allergies      Medication List     STOP taking these medications    aspirin EC 81 MG tablet   cyclobenzaprine 10 MG tablet Commonly known as: FLEXERIL   TYLENOL ARTHRITIS PAIN PO Replaced by: acetaminophen 500 MG tablet       TAKE these medications    acetaminophen 500 MG tablet Commonly known as: TYLENOL Take 2 tablets (1,000 mg total) by mouth every 6 (six) hours as needed for mild pain or moderate pain. Replaces: TYLENOL ARTHRITIS PAIN PO   atorvastatin 40 MG tablet Commonly known as: LIPITOR Take 1 tablet (40 mg total) by mouth daily.   blood glucose meter kit and supplies Kit Test daily Dx  r73.03   Blood Pressure Monitor Kit 1 each by Does not apply route daily.   cyanocobalamin 1000 MCG tablet Commonly known as: VITAMIN  B12 Take 1,000 mcg by mouth daily.   Farxiga 5 MG Tabs tablet Generic drug: dapagliflozin propanediol Take 5 mg by mouth every morning.   ferrous sulfate 325 (65 FE) MG tablet Take 65 mg by mouth every 3 (three) days.   gabapentin 300 MG capsule Commonly known as: NEURONTIN Take 1 capsule (300 mg total) by mouth at bedtime.   glucose blood test strip Use as instructed. Can check up to 4 times daily.   Kerendia 10  MG Tabs Generic drug: Finerenone Take 10 mg by mouth daily.   latanoprost 0.005 % ophthalmic solution Commonly known as: XALATAN Place 1 drop into both eyes at bedtime.   losartan 25 MG tablet Commonly known as: COZAAR Take 1 tablet (25 mg total) by mouth daily.   methocarbamol 750 MG tablet Commonly known as: Robaxin-750 Take 1 tablet (750 mg total) by mouth every 8 (eight) hours as needed for muscle spasms.   multivitamin tablet Take 1 tablet by mouth daily. One a day   Narcan 4 MG/0.1ML Liqd nasal spray kit Generic drug: naloxone Place 0.4 mg into the nose once.   ondansetron 4 MG disintegrating tablet Commonly known as: ZOFRAN-ODT Take 1 tablet (4 mg total) by mouth 2 (two) times daily as needed for nausea or vomiting.   Oxycodone HCl 20 MG Tabs Take 20 mg by mouth 4 (four) times daily as needed (pain). What changed: Another medication with the same name was added. Make sure you understand how and when to take each.   Oxycodone HCl 10 MG Tabs Take 0.5-1 tablets (5-10 mg total) by mouth every 8 (eight) hours as needed (for severe pain after surgery not solved with your normal daily Oxycodone dose). What changed: You were already taking a medication with the same name, and this prescription was added. Make sure you understand how and when to take each.   polyethylene glycol 17 g packet Commonly known as: MiraLax Take 17 g by mouth daily. to prevent constipation   rivaroxaban 10 MG Tabs tablet Commonly known as: Xarelto Take 1 tablet (10 mg total) by mouth daily. to prevent blood clots after surgery. YOU MUST TAKE THIS MEDICINE!   Semaglutide (2 MG/DOSE) 8 MG/3ML Sopn Inject 2 mg as directed once a week. What changed: when to take this   sildenafil 50 MG tablet Commonly known as: Viagra Take 1 tablet (50 mg total) by mouth daily as needed for erectile dysfunction.   timolol 0.5 % ophthalmic solution Commonly known as: BETIMOL Place 1 drop into both eyes in the  morning.               Discharge Care Instructions  (From admission, onward)           Start     Ordered   12/17/21 0000  Weight bearing as tolerated        12/17/21 1532            Follow-up Information     Renette Butters, MD. Go on 12/31/2021.   Specialty: Orthopedic Surgery Why: at 4:15pm Contact information: [address] [place] [address] [phone]                 Signed: Alisa Graff 12/17/2021, 3:35 PM

## 2021-12-17 NOTE — Progress Notes (Signed)
Physical Therapy Treatment Patient Details Name: [name] MRN: [id number] DOB: [birth year] Today's Date: 12/17/2021   History of Present Illness 51 yo male s/p L THA-DA 7/25.    PT Comments    Progressing well with mobility. All education completed.    Recommendations for follow up therapy are one component of a multi-disciplinary discharge planning process, led by the attending physician.  Recommendations may be updated based on patient status, additional functional criteria and insurance authorization.  Follow Up Recommendations  Follow physician's recommendations for discharge plan and follow up therapies     Assistance Recommended at Discharge Intermittent Supervision/Assistance  Patient can return home with the following A little help with walking and/or transfers;A little help with bathing/dressing/bathroom;Assistance with cooking/housework;Assist for transportation;Help with stairs or ramp for entrance   Equipment Recommendations  Rolling walker (2 wheels)    Recommendations for Other Services       Precautions / Restrictions Precautions Precautions: Fall Restrictions Weight Bearing Restrictions: No LLE Weight Bearing: Weight bearing as tolerated     Mobility  Bed Mobility Overal bed mobility: Needs Assistance Bed Mobility: Supine to Sit          General bed mobility comments: oob in recliner    Transfers Overall transfer level: Needs assistance Equipment used: Rolling walker (2 wheels) Transfers: Sit to/from Stand Sit to Stand: Supervision           General transfer comment: Supv for safety.    Ambulation/Gait Ambulation/Gait assistance: Supervision Gait Distance (Feet): 135 Feet Assistive device: Rolling walker (2 wheels) Gait Pattern/deviations: Step-through pattern, Decreased stride length       General Gait Details: Supv for safety.   Stairs             Wheelchair Mobility    Modified Rankin (Stroke Patients Only)        Balance Overall balance assessment: Mild deficits observed, not formally tested         Standing balance support: Bilateral upper extremity supported, Reliant on assistive device for balance, During functional activity Standing balance-Leahy Scale: Fair                              Cognition Arousal/Alertness: Awake/alert Behavior During Therapy: WFL for tasks assessed/performed Overall Cognitive Status: Within Functional Limits for tasks assessed                                          Exercises      General Comments        Pertinent Vitals/Pain Pain Assessment Pain Assessment: 0-10 Pain Score: 3  Pain Location: L hip/thigh Pain Descriptors / Indicators: Discomfort, Sore Pain Intervention(s): Monitored during session    Home Living                          Prior Function            PT Goals (current goals can now be found in the care plan section) Progress towards PT goals: Progressing toward goals    Frequency    7X/week      PT Plan Current plan remains appropriate    Co-evaluation              AM-PAC PT "6 Clicks" Mobility   Outcome Measure  Help needed turning from your back  to your side while in a flat bed without using bedrails?: None Help needed moving from lying on your back to sitting on the side of a flat bed without using bedrails?: None Help needed moving to and from a bed to a chair (including a wheelchair)?: A Little Help needed standing up from a chair using your arms (e.g., wheelchair or bedside chair)?: A Little Help needed to walk in hospital room?: A Little Help needed climbing 3-5 steps with a railing? : A Little 6 Click Score: 20    End of Session Equipment Utilized During Treatment: Gait belt Activity Tolerance: Patient tolerated treatment well Patient left: in chair;with call bell/phone within reach   PT Visit Diagnosis: Pain;Other abnormalities of gait and mobility  (R26.89) Pain - Right/Left: Left Pain - part of body: Hip     Time: 9390-3009 PT Time Calculation (min) (ACUTE ONLY): 10 min  Charges:  $Gait Training: 8-22 mins                        Doreatha Massed, PT Acute Rehabilitation  Office: [phone] Pager: [phone]

## 2021-12-17 NOTE — Progress Notes (Signed)
    Subjective: Patient reports pain as moderate.  Tolerating diet.  Urinating.   No CP, SOB.  Has mobilized OOB with PT.   Objective:   VITALS:   Vitals:   12/16/21 2135 12/17/21 0154 12/17/21 0623 12/17/21 0942  BP: 104/77 127/77 129/74 124/77  Pulse: 93 85 79 81  Resp: '17 17 17 18  '$ Temp: 98.2 F (36.8 C) 97.7 F (36.5 C) 97.8 F (36.6 C) 98.5 F (36.9 C)  TempSrc: Oral     SpO2: 100% 98% 99% 99%  Weight:      Height:          Latest Ref Rng & Units 12/04/2021    9:30 AM 05/15/2021    2:25 PM 01/02/2021    9:38 AM  CBC  WBC 4.0 - 10.5 K/uL 6.8  8.1  9.4   Hemoglobin 13.0 - 17.0 g/dL 15.5  15.0  15.9   Hematocrit 39.0 - 52.0 % 45.1  44.5  47.7   Platelets 150 - 400 K/uL 178  215  207       Latest Ref Rng & Units 12/04/2021    9:30 AM 10/17/2021   10:30 AM 05/15/2021    2:25 PM  BMP  Glucose 70 - 99 mg/dL 125  115  128   BUN 6 - 20 mg/dL '25  27  25   '$ Creatinine 0.61 - 1.24 mg/dL 1.62  1.69  1.63   BUN/Creat Ratio 9 - '20  16  15   '$ Sodium 135 - 145 mmol/L 138  139  137   Potassium 3.5 - 5.1 mmol/L 3.9  4.4  4.1   Chloride 98 - 111 mmol/L 105  100  97   CO2 22 - 32 mmol/L '26  23  26   '$ Calcium 8.9 - 10.3 mg/dL 9.6  9.7  9.1    Intake/Output      07/25 0701 07/26 0700 07/26 0701 07/27 0700   P.O. 480 240   I.V. (mL/kg) 1000 (8.4)    IV Piggyback 500    Total Intake(mL/kg) 1980 (16.6) 240 (2)   Urine (mL/kg/hr) 3600 (1.3) 200 (0.3)   Blood 300    Total Output 3900 200   Net -1920 +40           Physical Exam: General: NAD.  Sitting up in bedside chair, calm, comfortable Resp: No increased wob Cardio: regular rate and rhythm ABD soft Neurologically intact MSK Neurovascularly intact Sensation intact distally Intact pulses distally Dorsiflexion/Plantar flexion intact Incision: dressing C/D/I   Assessment: 1 Day Post-Op  S/P Procedure(s) (LRB): TOTAL HIP ARTHROPLASTY ANTERIOR APPROACH (Left) by Dr. Ernesta Amble. Murphy on 12/16/21  Principal  Problem:   S/P total left hip arthroplasty   Plan:  Advance diet Up with therapy Incentive Spirometry Elevate and Apply ice  Weightbearing: WBAT LLE Insicional and dressing care: Dressings left intact until follow-up and Reinforce dressings as needed Orthopedic device(s): None Showering: Keep dressing dry VTE prophylaxis:  Xarelto '10mg'$    x 30 days , SCDs, ambulation Pain control: continue current regimen Follow - up plan: 2 weeks Contact information:  Edmonia Lynch MD, [name] PA-C  Dispo: Home today     Britt Bottom, [place] Office [phone] 12/17/2021, 12:50 PM

## 2021-12-17 NOTE — TOC Transition Note (Signed)
Transition of Care [facility]) - CM/SW Discharge Note  Patient Details  Name: [name] MRN: [id number] Date of Birth: [birth year]  [facility] CM/SW Contact:  Sherie Don, LCSW Phone Number: 12/17/2021, 11:20 AM  Clinical Narrative: Patient is expected to discharge home after working with PT. CSW met with patient to confirm discharge plan. Patient will go home with OPPT at [facility]. Patient will need a rolling walker, which was delivered to patient's room by [employer]. TOC signing off.  Final next level of care: OP Rehab Barriers to Discharge: No Barriers Identified  Patient Goals and CMS Choice Patient states their goals for this hospitalization and ongoing recovery are:: Discharge home with OPPT at [facility] CMS Medicare.gov Compare Post Acute Care list provided to:: Patient Choice offered to / list presented to : Patient  Discharge Plan and Services       DME Arranged: Walker rolling DME Agency: [employer] Representative spoke with at DME Agency: Prearranged in orthopedist's office  Readmission Risk Interventions     No data to display

## 2021-12-17 NOTE — Progress Notes (Signed)
Physical Therapy Treatment Patient Details Name: [name] MRN: [id number] DOB: [birth year] Today's Date: 12/17/2021   History of Present Illness 51 yo male s/p L THA-DA 7/25.    PT Comments    Progressing well. Will plan to have one more session prior to d/c home later today if okay with surgeon/PA.    Recommendations for follow up therapy are one component of a multi-disciplinary discharge planning process, led by the attending physician.  Recommendations may be updated based on patient status, additional functional criteria and insurance authorization.  Follow Up Recommendations  Follow physician's recommendations for discharge plan and follow up therapies     Assistance Recommended at Discharge Intermittent Supervision/Assistance  Patient can return home with the following A little help with walking and/or transfers;A little help with bathing/dressing/bathroom;Assistance with cooking/housework;Assist for transportation;Help with stairs or ramp for entrance   Equipment Recommendations  Rolling walker (2 wheels)    Recommendations for Other Services       Precautions / Restrictions Precautions Precautions: Fall Restrictions Weight Bearing Restrictions: No LLE Weight Bearing: Weight bearing as tolerated     Mobility  Bed Mobility Overal bed mobility: Needs Assistance Bed Mobility: Supine to Sit     Supine to sit: Supervision, HOB elevated     General bed mobility comments: Supv for safety. Increased time with some use of bedrail.    Transfers Overall transfer level: Needs assistance Equipment used: Rolling walker (2 wheels) Transfers: Sit to/from Stand Sit to Stand: Min guard, From elevated surface           General transfer comment: Min guard for safety. Cues for safsety, hand placement    Ambulation/Gait Ambulation/Gait assistance: Min guard Gait Distance (Feet): 115 Feet Assistive device: Rolling walker (2 wheels) Gait Pattern/deviations:  Step-through pattern, Decreased stride length       General Gait Details: Min guard for safety.   Stairs             Wheelchair Mobility    Modified Rankin (Stroke Patients Only)       Balance Overall balance assessment: Mild deficits observed, not formally tested                                          Cognition Arousal/Alertness: Awake/alert Behavior During Therapy: WFL for tasks assessed/performed Overall Cognitive Status: Within Functional Limits for tasks assessed                                          Exercises      General Comments        Pertinent Vitals/Pain Pain Assessment Pain Assessment: 0-10 Pain Score: 3  Pain Location: L hip/thigh Pain Descriptors / Indicators: Discomfort, Sore Pain Intervention(s): Monitored during session, Repositioned    Home Living                          Prior Function            PT Goals (current goals can now be found in the care plan section) Progress towards PT goals: Progressing toward goals    Frequency    7X/week      PT Plan Current plan remains appropriate    Co-evaluation  AM-PAC PT "6 Clicks" Mobility   Outcome Measure  Help needed turning from your back to your side while in a flat bed without using bedrails?: None Help needed moving from lying on your back to sitting on the side of a flat bed without using bedrails?: A Little Help needed moving to and from a bed to a chair (including a wheelchair)?: A Little Help needed standing up from a chair using your arms (e.g., wheelchair or bedside chair)?: A Little Help needed to walk in hospital room?: A Little Help needed climbing 3-5 steps with a railing? : A Little 6 Click Score: 19    End of Session Equipment Utilized During Treatment: Gait belt Activity Tolerance: Patient tolerated treatment well Patient left: in chair;with call bell/phone within reach   PT Visit  Diagnosis: Pain;Other abnormalities of gait and mobility (R26.89) Pain - Right/Left: Left Pain - part of body: Hip     Time: 1610-9604 PT Time Calculation (min) (ACUTE ONLY): 15 min  Charges:  $Gait Training: 8-22 mins                         Doreatha Massed, PT Acute Rehabilitation  Office: [phone] Pager: [phone]

## 2021-12-17 NOTE — Plan of Care (Signed)
Plan of care reviewed and discussed with the patient. 

## 2021-12-18 ENCOUNTER — Encounter (HOSPITAL_COMMUNITY): Payer: Self-pay | Admitting: Physical Therapy

## 2021-12-18 ENCOUNTER — Telehealth: Payer: Self-pay

## 2021-12-18 ENCOUNTER — Ambulatory Visit (HOSPITAL_COMMUNITY): Payer: Medicaid Other | Attending: Orthopedic Surgery | Admitting: Physical Therapy

## 2021-12-18 DIAGNOSIS — M6281 Muscle weakness (generalized): Secondary | ICD-10-CM | POA: Insufficient documentation

## 2021-12-18 DIAGNOSIS — R29898 Other symptoms and signs involving the musculoskeletal system: Secondary | ICD-10-CM | POA: Insufficient documentation

## 2021-12-18 DIAGNOSIS — R2689 Other abnormalities of gait and mobility: Secondary | ICD-10-CM | POA: Diagnosis not present

## 2021-12-18 DIAGNOSIS — R2681 Unsteadiness on feet: Secondary | ICD-10-CM | POA: Diagnosis not present

## 2021-12-18 DIAGNOSIS — M25552 Pain in left hip: Secondary | ICD-10-CM | POA: Diagnosis not present

## 2021-12-18 DIAGNOSIS — Z96642 Presence of left artificial hip joint: Secondary | ICD-10-CM | POA: Diagnosis not present

## 2021-12-18 NOTE — Telephone Encounter (Signed)
Transition Care Management Follow-up Telephone Call Date of discharge and from where: [facility] 12/17/21 How have you been since you were released from the hospital? Good  Any questions or concerns? No  Items Reviewed: Did the pt receive and understand the discharge instructions provided? Yes  Medications obtained and verified? Yes  Other?    Any new allergies since your discharge? No  Dietary orders reviewed? Yes Do you have support at home? Yes   Home Care and Equipment/Supplies: Were home health services ordered? no If so, what is the name of the agency? N/a  Has the agency set up a time to come to the patient's home? not applicable Were any new equipment or medical supplies ordered?  No What is the name of the medical supply agency? N/a Were you able to get the supplies/equipment? not applicable Do you have any questions related to the use of the equipment or supplies? No  Functional Questionnaire: (I = Independent and D = Dependent) ADLs: i  Bathing/Dressing- i  Meal Prep- i  Eating- i  Maintaining continence- i  Transferring/Ambulation- i  Managing Meds- i  Follow up appointments reviewed:  PCP Hospital f/u appt confirmed? Yes  Scheduled to see Peter Congo on 01/02/22 @ 10:20. [facility] f/u appt confirmed? Yes  Scheduled to see Ortho . Are transportation arrangements needed? No  If their condition worsens, is the pt aware to call PCP or go to the Emergency Dept.? Yes Was the patient provided with contact information for the PCP's office or ED? Yes Was to pt encouraged to call back with questions or concerns? Yes

## 2021-12-18 NOTE — Therapy (Addendum)
OUTPATIENT PHYSICAL THERAPY LOWER EXTREMITY EVALUATION   Patient Name: [name] MRN: [id number] DOB:1971/04/07, 51 y.o., male Today's Date: 12/18/2021   PT End of Session - 12/18/21 1347     Visit Number 1    Number of Visits 12    Date for PT Re-Evaluation 01/29/22    Authorization Type Medicaid [employer] (12 visits requested, check auth)    Authorization - Visit Number 1    Authorization - Number of Visits 1    PT Start Time 1346    PT Stop Time 1421    PT Time Calculation (min) 35 min    Activity Tolerance Patient tolerated treatment well    Behavior During Therapy WFL for tasks assessed/performed             Past Medical History:  Diagnosis Date   ALLERGY 08/03/2008   Qualifier: Diagnosis of  By: Christ Kick     Anemia    Arthritis    "hips" (01/15/2015)   Blood transfusion without reported diagnosis    Cellulitis 07/25/2018   Chronic back pain greater than 3 months duration 02/11/2017   Chronic lower back pain    Colon polyps    COLONIC POLYPS, ADENOMATOUS 08/03/2008   Qualifier: Diagnosis of  By: Christ Kick     CVA 08/03/2008   Qualifier: History of  By: Christ Kick     Depression    denies   Diabetes mellitus    diet controlled- no meds since wt loss   Diabetes mellitus ([place]) 07/10/2019   Dysrhythmia    s/p surgery bariatric- cardioversion   Erectile dysfunction 01/26/2018   GERD (gastroesophageal reflux disease)    HIATAL HERNIA 08/03/2008   Qualifier: Diagnosis of  By: Christ Kick     Hip pain    History of gout    Hyperlipidemia    Hypertension    Joint pain    Narcotic dependence ([place]) 02/11/2017   Neuromuscular disorder ([place])    PONV (postoperative nausea and vomiting)    Prediabetes 03/29/2017   Sleep apnea    no longer have to use cpap since wt loss   Stage 3 chronic kidney disease ([place]) 01/26/2018   Stroke ([place]) 2006   denies residual on 01/15/2015   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 ([place])  08/01/2018   Past Surgical History:  Procedure Laterality Date   BARIATRIC SURGERY  2010   in [place]  02/26/2021   Procedure: BIOPSY;  Surgeon: Harvel Quale, MD;  [facility]: [facility];  Service: Gastroenterology;;   CARDIOVERSION     COLONOSCOPY N/A 08/02/2014   Procedure: COLONOSCOPY;  Surgeon: Rogene Houston, MD;  [facility]: [facility];  Service: Endoscopy;  Laterality: N/A;  210 - moved to 3/10 @ 11:55 - Ann notified pt   COLONOSCOPY WITH PROPOFOL N/A 02/26/2021   Procedure: COLONOSCOPY WITH PROPOFOL;  Surgeon: Harvel Quale, MD;  [facility]: [facility];  Service: Gastroenterology;  Laterality: N/A;  10:00   ESOPHAGOGASTRODUODENOSCOPY     ESOPHAGOGASTRODUODENOSCOPY (EGD) WITH PROPOFOL N/A 02/26/2021   Procedure: ESOPHAGOGASTRODUODENOSCOPY (EGD) WITH PROPOFOL;  Surgeon: Harvel Quale, MD;  [facility];  Service: Gastroenterology;  Laterality: N/A;   HERNIA REPAIR     LIPOSUCTION     PANNICULECTOMY  01/14/2015   PANNICULECTOMY N/A 01/14/2015   Procedure:  TOTAL PANNICULECTOMY WTH LIPOSUCTION OF SIDES;  Surgeon: Cristine Polio, MD;  Location: [facility];  Service: Plastics;  Laterality: N/A;  POLYPECTOMY  02/26/2021   Procedure: POLYPECTOMY INTESTINAL;  Surgeon: Harvel Quale, MD;  [facility]: [facility];  Service: Gastroenterology;;   TONSILLECTOMY     TOTAL HIP ARTHROPLASTY Left 12/16/2021   Procedure: TOTAL HIP ARTHROPLASTY ANTERIOR APPROACH;  Surgeon: Renette Butters, MD;  Location: [facility];  Service: Orthopedics;  Laterality: Left;   VENTRAL HERNIA REPAIR  01/14/2015   VENTRAL HERNIA REPAIR N/A 01/14/2015   Procedure: HERNIA REPAIR VENTRAL ADULT AND MUSCLE REPAIR;  Surgeon: Cristine Polio, MD;  Location: [facility];  Service: Plastics;  Laterality: N/A;   Patient Active Problem List   Diagnosis Date Noted   S/P total left hip arthroplasty 12/16/2021   Need for varicella vaccine 10/16/2021   OSA  09/11/2021    Pre-operative clearance 06/19/2021   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   Immunization due 02/06/2021   Iron deficiency anemia 12/30/2020   History of colonic polyps 12/30/2020   Unilateral primary osteoarthritis, right hip 08/13/2020   Unilateral primary osteoarthritis, left hip 08/13/2020   Muscle spasms of both lower extremities 05/23/2020   Need for immunization against influenza 03/06/2020   Primary osteoarthritis of both hips 07/10/2019   Bilateral primary osteoarthritis of hip 07/10/2019   Vitamin D deficiency 04/13/2019   Morbid obesity with body mass index (BMI) of 40.0 to 44.9 in adult ([place]) 04/13/2019   Depressive disorder 04/13/2019   Body mass index (BMI) 45.0-49.9, adult ([place]) 04/13/2019   Kidney disease, chronic, stage III (GFR 30-59 ml/min) ([place]) 07/20/2017   History of cerebrovascular accident 07/20/2017   Gastroesophageal reflux disease 07/20/2017   Gastric bypass status for obesity 02/11/2017   Benzodiazepine dependence ([place]) 02/11/2017   Type 2 diabetes mellitus with diabetic nephropathy ([place]) 08/03/2008   Hyperlipidemia 08/03/2008   High blood pressure 08/03/2008   SLEEP APNEA 08/03/2008   Sleep apnea 08/03/2008    PCP: Vena Rua FNP  REFERRING PROVIDER: Renette Butters, MD   REFERRING DIAG: post op lt THR per [place], MD DOS 12/16/21   THERAPY DIAG:  Pain in left hip - Plan: PT plan of care cert/re-cert  Muscle weakness (generalized) - Plan: PT plan of care cert/re-cert  Other abnormalities of gait and mobility - Plan: PT plan of care cert/re-cert  Other symptoms and signs involving the musculoskeletal system - Plan: PT plan of care cert/re-cert  Status post left hip replacement - Plan: PT plan of care cert/re-cert  Rationale for Evaluation and Treatment Rehabilitation  ONSET DATE: 12/16/21  SUBJECTIVE:   SUBJECTIVE STATEMENT: Patient states hip is just sore. Burning in his thigh  muscles. He was using walker and cane and rollator primarily before surgery.   PERTINENT HISTORY: obesity, depression, advanced hip OA, history of morbid obesity (was 600 pounds), history of low back pain  PAIN:  Are you having pain? No  PRECAUTIONS: None  WEIGHT BEARING RESTRICTIONS No  FALLS:  Has patient fallen in last 6 months? No  LIVING ENVIRONMENT: Lives with: lives alone Lives in: House/apartment Stairs: No Has following equipment at home: Single point cane, Environmental consultant - 2 wheeled, and Environmental consultant - 4 wheeled  OCCUPATION: Disability  PLOF: Independent  PATIENT GOALS get the leg better and improve walking   OBJECTIVE:  PATIENT SURVEYS:  LEFS 20/80  COGNITION:  Overall cognitive status: Within functional limits for tasks assessed     SENSATION: WFL  POSTURE: rounded shoulders and forward head  PALPATION: TTP grossly throughout L hip/quads  LOWER EXTREMITY ROM:  Active ROM Right eval Left eval  Hip flexion    Hip extension    Hip abduction    Hip adduction    Hip internal rotation    Hip external rotation    Knee flexion    Knee extension    Ankle dorsiflexion    Ankle plantarflexion    Ankle inversion    Ankle eversion     (Blank rows = not tested)  LOWER EXTREMITY MMT:  MMT Right eval Left eval  Hip flexion 4- 3-  Hip extension    Hip abduction    Hip adduction    Hip internal rotation    Hip external rotation    Knee flexion 5 5  Knee extension 5 5  Ankle dorsiflexion 5 5  Ankle plantarflexion    Ankle inversion    Ankle eversion     (Blank rows = not tested)    FUNCTIONAL TESTS:  2 minute walk test: 150 feet with RW Transfers: labored with UE support Stair: 4 inch stairs with alternating pattern, requires bilateral UE support  GAIT: Distance walked: 150 Assistive device utilized: Environmental consultant - 2 wheeled Level of assistance: Modified independence Comments: 2MWT, limited L knee flexion/extension throughout, antalgic    TODAY'S  TREATMENT: 12/18/21 Ankle pumps x 20 Glute sets 10x 10  second holds Bridge 2x 10 Heel slides with belt 1  x 10    PATIENT EDUCATION:  Education details: Patient educated on exam findings, POC, scope of PT, HEP, and exercise mechanics. Person educated: Patient Education method: Explanation, Demonstration, and Handouts Education comprehension: verbalized understanding, returned demonstration, verbal cues required, and tactile cues required   HOME EXERCISE PROGRAM: Access Code: [email or web address] Date: 12/18/2021 - Supine Ankle Pumps  - 3 x daily - 7 x weekly - 20 reps - Supine Gluteal Sets  - 3 x daily - 7 x weekly - 10 reps - 10 second hold - Supine Heel Slide with Strap  - 3 x daily - 7 x weekly - 2 sets - 10 reps - Seated Long Arc Quad (Mirrored)  - 3 x daily - 7 x weekly - 1 sets - 10 reps - 5 second hold  ASSESSMENT:  CLINICAL IMPRESSION: Patient a 51 y.o. y.o. male who was seen today for physical therapy evaluation and treatment for s/p L THR DOS 12/16/21. Patient presents with pain limited deficits in L hip strength, ROM, endurance, activity tolerance, gait, balance, and functional mobility with ADL. Patient is having to modify and restrict ADL as indicated by outcome measure score as well as subjective information and objective measures which is affecting overall participation. Patient will benefit from skilled physical therapy in order to improve function and reduce impairment.    OBJECTIVE IMPAIRMENTS Abnormal gait, decreased activity tolerance, decreased balance, decreased endurance, decreased mobility, difficulty walking, decreased ROM, decreased strength, increased edema, increased muscle spasms, impaired flexibility, improper body mechanics, and pain.   ACTIVITY LIMITATIONS carrying, lifting, bending, standing, squatting, sleeping, stairs, transfers, bathing, dressing, hygiene/grooming, locomotion level, and caring for others  PARTICIPATION LIMITATIONS: meal prep, cleaning,  laundry, shopping, community activity, and yard work  PERSONAL FACTORS Time since onset of injury/illness/exacerbation and 3+ comorbidities: obesity, depression, advanced hip OA, history of morbid obesity (was 600 pounds), history of low back pain  are also affecting patient's functional outcome.   REHAB POTENTIAL: Good  CLINICAL DECISION MAKING: Stable/uncomplicated  EVALUATION COMPLEXITY: Low   GOALS: Goals reviewed with patient? Yes  SHORT TERM GOALS: Target date: 01/08/2022  Patient will be  independent with HEP in order to improve functional outcomes. Baseline:  Goal status: INITIAL  2.  Patient will report at least 25% improvement in symptoms for improved quality of life. Baseline:  Goal status: INITIAL   LONG TERM GOALS: Target date: 01/29/2022  Patient will report at least 75% improvement in symptoms for improved quality of life. Baseline:  Goal status: INITIAL  2.  Patient will improve LEFS core by at least 18 points in order to indicate improved tolerance to activity. Baseline: 20/80 Goal status: INITIAL  3.  Patient will be able to navigate standard stairs with reciprocal pattern without compensation on LLE in order to demonstrate improved LE strength. Baseline: 4 inch steps with UE support Goal status: INITIAL  4.  Patient will be able to ambulate at least 350 feet with LRAD in 2MWT in order to demonstrate improved tolerance to activity. Baseline: 150 with RW Goal status: INITIAL  5.  Patient will demonstrate grade of 5/5 MMT grade in all tested musculature in LLE as evidence of improved strength to assist with stair ambulation and gait.   Baseline: see MMT Goal status: INITIAL    PLAN: PT FREQUENCY: 2x/week  PT DURATION: 6 weeks  PLANNED INTERVENTIONS: Therapeutic exercises, Therapeutic activity, Neuromuscular re-education, Balance training, Gait training, Patient/Family education, Joint manipulation, Joint mobilization, Stair training, Orthotic/Fit  training, DME instructions, Aquatic Therapy, Dry Needling, Electrical stimulation, Spinal manipulation, Spinal mobilization, Cryotherapy, Moist heat, Compression bandaging, scar mobilization, Splintting, Taping, Traction, Ultrasound, Ionotophoresis '4mg'$ /ml Dexamethasone, and Manual therapy   PLAN FOR NEXT SESSION: f/u with HEP, left hip mobility and strength and progress as tolerated   Mearl Latin, PT 12/18/2021, 2:29 PM

## 2021-12-22 ENCOUNTER — Ambulatory Visit (HOSPITAL_COMMUNITY): Payer: Medicaid Other | Attending: Orthopedic Surgery | Admitting: Physical Therapy

## 2021-12-22 ENCOUNTER — Encounter (HOSPITAL_COMMUNITY): Payer: Self-pay | Admitting: Physical Therapy

## 2021-12-22 DIAGNOSIS — R29898 Other symptoms and signs involving the musculoskeletal system: Secondary | ICD-10-CM | POA: Diagnosis not present

## 2021-12-22 DIAGNOSIS — Z96642 Presence of left artificial hip joint: Secondary | ICD-10-CM | POA: Diagnosis not present

## 2021-12-22 DIAGNOSIS — R2689 Other abnormalities of gait and mobility: Secondary | ICD-10-CM | POA: Insufficient documentation

## 2021-12-22 DIAGNOSIS — M25552 Pain in left hip: Secondary | ICD-10-CM | POA: Diagnosis not present

## 2021-12-22 DIAGNOSIS — M6281 Muscle weakness (generalized): Secondary | ICD-10-CM | POA: Insufficient documentation

## 2021-12-22 NOTE — Therapy (Signed)
OUTPATIENT PHYSICAL THERAPY LOWER EXTREMITY EVALUATION   Patient Name: [name] MRN: [id number] DOB:07/01/1970, 51 y.o., male Today's Date: 12/22/2021   PT End of Session - 12/22/21 1304     Visit Number 2    Number of Visits 12    Date for PT Re-Evaluation 01/29/22    Authorization Type Medicaid [employer]    Authorization Time Period 8 visits approved 12/18/21 -02/15/22 - will need to request more if needed    Authorization - Visit Number 1    Authorization - Number of Visits 8    PT Start Time 1303    PT Stop Time 1341    PT Time Calculation (min) 38 min    Activity Tolerance Patient tolerated treatment well    Behavior During Therapy [facility] for tasks assessed/performed             Past Medical History:  Diagnosis Date   ALLERGY 08/03/2008   Qualifier: Diagnosis of  By: Christ Kick     Anemia    Arthritis    "hips" (01/15/2015)   Blood transfusion without reported diagnosis    Cellulitis 07/25/2018   Chronic back pain greater than 3 months duration 02/11/2017   Chronic lower back pain    Colon polyps    COLONIC POLYPS, ADENOMATOUS 08/03/2008   Qualifier: Diagnosis of  By: Christ Kick     CVA 08/03/2008   Qualifier: History of  By: Christ Kick     Depression    denies   Diabetes mellitus    diet controlled- no meds since wt loss   Diabetes mellitus ([place]) 07/10/2019   Dysrhythmia    s/p surgery bariatric- cardioversion   Erectile dysfunction 01/26/2018   GERD (gastroesophageal reflux disease)    HIATAL HERNIA 08/03/2008   Qualifier: Diagnosis of  By: Christ Kick     Hip pain    History of gout    Hyperlipidemia    Hypertension    Joint pain    Narcotic dependence ([place]) 02/11/2017   Neuromuscular disorder ([place])    PONV (postoperative nausea and vomiting)    Prediabetes 03/29/2017   Sleep apnea    no longer have to use cpap since wt loss   Stage 3 chronic kidney disease ([place]) 01/26/2018   Stroke ([place]) 2006   denies residual  on 01/15/2015   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 ([place]) 08/01/2018   Past Surgical History:  Procedure Laterality Date   BARIATRIC SURGERY  2010   in [place]  02/26/2021   Procedure: BIOPSY;  Surgeon: Harvel Quale, MD;  [facility];  Service: Gastroenterology;;   CARDIOVERSION     COLONOSCOPY N/A 08/02/2014   Procedure: COLONOSCOPY;  Surgeon: Rogene Houston, MD;  [facility];  Service: Endoscopy;  Laterality: N/A;  210 - moved to 3/10 @ 11:55 - Ann notified pt   COLONOSCOPY WITH PROPOFOL N/A 02/26/2021   Procedure: COLONOSCOPY WITH PROPOFOL;  Surgeon: Harvel Quale, MD;  [facility];  Service: Gastroenterology;  Laterality: N/A;  10:00   ESOPHAGOGASTRODUODENOSCOPY     ESOPHAGOGASTRODUODENOSCOPY (EGD) WITH PROPOFOL N/A 02/26/2021   Procedure: ESOPHAGOGASTRODUODENOSCOPY (EGD) WITH PROPOFOL;  Surgeon: Harvel Quale, MD;  [facility];  Service: Gastroenterology;  Laterality: N/A;   HERNIA REPAIR     LIPOSUCTION     PANNICULECTOMY  01/14/2015   PANNICULECTOMY N/A 01/14/2015   Procedure:  TOTAL PANNICULECTOMY WTH LIPOSUCTION OF SIDES;  Surgeon:  Cristine Polio, MD;  Location: [place];  Service: Plastics;  Laterality: N/A;   POLYPECTOMY  02/26/2021   Procedure: POLYPECTOMY INTESTINAL;  Surgeon: Harvel Quale, MD;  [facility];  Service: Gastroenterology;;   TONSILLECTOMY     TOTAL HIP ARTHROPLASTY Left 12/16/2021   Procedure: TOTAL HIP ARTHROPLASTY ANTERIOR APPROACH;  Surgeon: Renette Butters, MD;  Location: [facility];  Service: Orthopedics;  Laterality: Left;   VENTRAL HERNIA REPAIR  01/14/2015   VENTRAL HERNIA REPAIR N/A 01/14/2015   Procedure: HERNIA REPAIR VENTRAL ADULT AND MUSCLE REPAIR;  Surgeon: Cristine Polio, MD;  Location: [place];  Service: Plastics;  Laterality: N/A;   Patient Active Problem List   Diagnosis Date Noted   S/P total left hip arthroplasty  12/16/2021   Need for varicella vaccine 10/16/2021   OSA  09/11/2021   Pre-operative clearance 06/19/2021   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   Immunization due 02/06/2021   Iron deficiency anemia 12/30/2020   History of colonic polyps 12/30/2020   Unilateral primary osteoarthritis, right hip 08/13/2020   Unilateral primary osteoarthritis, left hip 08/13/2020   Muscle spasms of both lower extremities 05/23/2020   Need for immunization against influenza 03/06/2020   Primary osteoarthritis of both hips 07/10/2019   Bilateral primary osteoarthritis of hip 07/10/2019   Vitamin D deficiency 04/13/2019   Morbid obesity with body mass index (BMI) of 40.0 to 44.9 in adult ([place]) 04/13/2019   Depressive disorder 04/13/2019   Body mass index (BMI) 45.0-49.9, adult ([place]) 04/13/2019   Kidney disease, chronic, stage III (GFR 30-59 ml/min) ([place]) 07/20/2017   History of cerebrovascular accident 07/20/2017   Gastroesophageal reflux disease 07/20/2017   Gastric bypass status for obesity 02/11/2017   Benzodiazepine dependence ([place]) 02/11/2017   Type 2 diabetes mellitus with diabetic nephropathy ([place]) 08/03/2008   Hyperlipidemia 08/03/2008   High blood pressure 08/03/2008   SLEEP APNEA 08/03/2008   Sleep apnea 08/03/2008    PCP: Vena Rua FNP  REFERRING PROVIDER: Renette Butters, MD   REFERRING DIAG: post op lt THR per Fredonia Highland, MD DOS 12/16/21   THERAPY DIAG:  Pain in left hip  Muscle weakness (generalized)  Other abnormalities of gait and mobility  Other symptoms and signs involving the musculoskeletal system  Status post left hip replacement  Rationale for Evaluation and Treatment Rehabilitation  ONSET DATE: 12/16/21  SUBJECTIVE:   SUBJECTIVE STATEMENT: Patient states hip is sore. Exercises did alright.   PERTINENT HISTORY: obesity, depression, advanced hip OA, history of morbid obesity (was 600 pounds), history of  low back pain  PAIN:  Are you having pain? Yes: NPRS scale: 3/10 Pain location: L hip Pain description: sore Aggravating factors:   Relieving factors: movement  PRECAUTIONS: None  WEIGHT BEARING RESTRICTIONS No  FALLS:  Has patient fallen in last 6 months? No  LIVING ENVIRONMENT: Lives with: lives alone Lives in: House/apartment Stairs: No Has following equipment at home: Single point cane, Environmental consultant - 2 wheeled, and Environmental consultant - 4 wheeled  OCCUPATION: Disability  PLOF: Independent  PATIENT GOALS get the leg better and improve walking   OBJECTIVE: (objective measures from initial evaluation unless otherwise dated) PATIENT SURVEYS:  LEFS 20/80  COGNITION:  Overall cognitive status: Within functional limits for tasks assessed     SENSATION: WFL  POSTURE: rounded shoulders and forward head  PALPATION: TTP grossly throughout L hip/quads  LOWER EXTREMITY ROM:  Active ROM Right eval Left eval  Hip flexion  Hip extension    Hip abduction    Hip adduction    Hip internal rotation    Hip external rotation    Knee flexion    Knee extension    Ankle dorsiflexion    Ankle plantarflexion    Ankle inversion    Ankle eversion     (Blank rows = not tested)  LOWER EXTREMITY MMT:  MMT Right eval Left eval  Hip flexion 4- 3-  Hip extension    Hip abduction    Hip adduction    Hip internal rotation    Hip external rotation    Knee flexion 5 5  Knee extension 5 5  Ankle dorsiflexion 5 5  Ankle plantarflexion    Ankle inversion    Ankle eversion     (Blank rows = not tested)    FUNCTIONAL TESTS:  2 minute walk test: 150 feet with RW Transfers: labored with UE support Stair: 4 inch stairs with alternating pattern, requires bilateral UE support  GAIT: Distance walked: 150 Assistive device utilized: Environmental consultant - 2 wheeled Level of assistance: Modified independence Comments: 2MWT, limited L knee flexion/extension throughout, antalgic    TODAY'S  TREATMENT: 12/22/21 Bridge 2x 10  Supine heel slides 2 x 10 with belt Hooklying hip abduction isometric 10 x 10 second holds Sideling clam 2x 10 LLE LAQ 10 x 5 second holds LLE HR 2x 10  Standing hip abduction 2 x 10 bilateral  Standing hip extension 2x 10 bilateral  Seated hip adduction isometric 10 x 10 second holds Seated march 2x 10 bilateral  Standing hamstring curls 2 x 10 bilateral  Mini squat 2 x 10    12/18/21 Ankle pumps x 20 Glute sets 10x 10  second holds Bridge 2x 10 Heel slides with belt 1  x 10    PATIENT EDUCATION:  Education details: 12/22/21 HEP;  Eval: Patient educated on exam findings, POC, scope of PT, HEP, and exercise mechanics. Person educated: Patient Education method: Explanation, Demonstration, and Handouts Education comprehension: verbalized understanding, returned demonstration, verbal cues required, and tactile cues required   HOME EXERCISE PROGRAM: Access Code: [facility] 12/22/21 - Standing Hip Abduction with Counter Support  - 3 x daily - 7 x weekly - 2 sets - 10 reps - Mini Squat with Counter Support  - 3 x daily - 7 x weekly - 2 sets - 10 reps  Date: 12/18/2021 - Supine Ankle Pumps  - 3 x daily - 7 x weekly - 20 reps - Supine Gluteal Sets  - 3 x daily - 7 x weekly - 10 reps - 10 second hold - Supine Heel Slide with Strap  - 3 x daily - 7 x weekly - 2 sets - 10 reps - Seated Long Arc Quad (Mirrored)  - 3 x daily - 7 x weekly - 1 sets - 10 reps - 5 second hold  ASSESSMENT:  CLINICAL IMPRESSION: Began session with previously completed supine exercises which he completes with good mechanics. Began additional glute strengthening on mat which is tolerated fairly well but is limited with mobility with clams. Patient highly motivated to improve function. Tolerates standing exercises fairly well but requires cueing for rest breaks. Patient will continue to benefit from physical therapy in order to improve function and reduce impairment.      OBJECTIVE IMPAIRMENTS Abnormal gait, decreased activity tolerance, decreased balance, decreased endurance, decreased mobility, difficulty walking, decreased ROM, decreased strength, increased edema, increased muscle spasms, impaired flexibility, improper body mechanics, and pain.  ACTIVITY LIMITATIONS carrying, lifting, bending, standing, squatting, sleeping, stairs, transfers, bathing, dressing, hygiene/grooming, locomotion level, and caring for others  PARTICIPATION LIMITATIONS: meal prep, cleaning, laundry, shopping, community activity, and yard work  PERSONAL FACTORS Time since onset of injury/illness/exacerbation and 3+ comorbidities: obesity, depression, advanced hip OA, history of morbid obesity (was 600 pounds), history of low back pain  are also affecting patient's functional outcome.   REHAB POTENTIAL: Good  CLINICAL DECISION MAKING: Stable/uncomplicated  EVALUATION COMPLEXITY: Low   GOALS: Goals reviewed with patient? Yes  SHORT TERM GOALS: Target date: 01/08/2022  Patient will be independent with HEP in order to improve functional outcomes. Baseline:  Goal status: INITIAL  2.  Patient will report at least 25% improvement in symptoms for improved quality of life. Baseline:  Goal status: INITIAL   LONG TERM GOALS: Target date: 01/29/2022  Patient will report at least 75% improvement in symptoms for improved quality of life. Baseline:  Goal status: INITIAL  2.  Patient will improve LEFS core by at least 18 points in order to indicate improved tolerance to activity. Baseline: 20/80 Goal status: INITIAL  3.  Patient will be able to navigate standard stairs with reciprocal pattern without compensation on LLE in order to demonstrate improved LE strength. Baseline: 4 inch steps with UE support Goal status: INITIAL  4.  Patient will be able to ambulate at least 350 feet with LRAD in 2MWT in order to demonstrate improved tolerance to activity. Baseline: 150 with  RW Goal status: INITIAL  5.  Patient will demonstrate grade of 5/5 MMT grade in all tested musculature in LLE as evidence of improved strength to assist with stair ambulation and gait.   Baseline: see MMT Goal status: INITIAL    PLAN: PT FREQUENCY: 2x/week  PT DURATION: 6 weeks  PLANNED INTERVENTIONS: Therapeutic exercises, Therapeutic activity, Neuromuscular re-education, Balance training, Gait training, Patient/Family education, Joint manipulation, Joint mobilization, Stair training, Orthotic/Fit training, DME instructions, Aquatic Therapy, Dry Needling, Electrical stimulation, Spinal manipulation, Spinal mobilization, Cryotherapy, Moist heat, Compression bandaging, scar mobilization, Splintting, Taping, Traction, Ultrasound, Ionotophoresis '4mg'$ /ml Dexamethasone, and Manual therapy   PLAN FOR NEXT SESSION: f/u with HEP, left hip mobility and strength and progress as tolerated   Vianne Bulls Vitaly Wanat, PT 12/22/2021, 1:06 PM

## 2021-12-24 ENCOUNTER — Encounter (HOSPITAL_COMMUNITY): Payer: Self-pay | Admitting: Physical Therapy

## 2021-12-24 ENCOUNTER — Ambulatory Visit (HOSPITAL_COMMUNITY): Payer: Medicaid Other | Attending: Nurse Practitioner | Admitting: Physical Therapy

## 2021-12-24 DIAGNOSIS — R2689 Other abnormalities of gait and mobility: Secondary | ICD-10-CM | POA: Insufficient documentation

## 2021-12-24 DIAGNOSIS — R29898 Other symptoms and signs involving the musculoskeletal system: Secondary | ICD-10-CM | POA: Insufficient documentation

## 2021-12-24 DIAGNOSIS — M6281 Muscle weakness (generalized): Secondary | ICD-10-CM | POA: Insufficient documentation

## 2021-12-24 DIAGNOSIS — Z96642 Presence of left artificial hip joint: Secondary | ICD-10-CM | POA: Diagnosis not present

## 2021-12-24 DIAGNOSIS — M25552 Pain in left hip: Secondary | ICD-10-CM | POA: Insufficient documentation

## 2021-12-24 NOTE — Therapy (Signed)
OUTPATIENT PHYSICAL THERAPY TREATMENT   Patient Name: [name] MRN: [id number] DOB:Jan 26, 1971, 51 y.o., male Today's Date: 12/24/2021   PT End of Session - 12/24/21 1303     Visit Number 3    Number of Visits 12    Date for PT Re-Evaluation 01/29/22    Authorization Type Medicaid [employer]    Authorization Time Period 8 visits approved 12/18/21 -02/15/22 - will need to request more if needed    Authorization - Visit Number 2    Authorization - Number of Visits 8    PT Start Time 1303    PT Stop Time 1341    PT Time Calculation (min) 38 min    Activity Tolerance Patient tolerated treatment well    Behavior During Therapy [facility] for tasks assessed/performed             Past Medical History:  Diagnosis Date   ALLERGY 08/03/2008   Qualifier: Diagnosis of  By: Christ Kick     Anemia    Arthritis    "hips" (01/15/2015)   Blood transfusion without reported diagnosis    Cellulitis 07/25/2018   Chronic back pain greater than 3 months duration 02/11/2017   Chronic lower back pain    Colon polyps    COLONIC POLYPS, ADENOMATOUS 08/03/2008   Qualifier: Diagnosis of  By: Christ Kick     CVA 08/03/2008   Qualifier: History of  By: Christ Kick     Depression    denies   Diabetes mellitus    diet controlled- no meds since wt loss   Diabetes mellitus ([place]) 07/10/2019   Dysrhythmia    s/p surgery bariatric- cardioversion   Erectile dysfunction 01/26/2018   GERD (gastroesophageal reflux disease)    HIATAL HERNIA 08/03/2008   Qualifier: Diagnosis of  By: Christ Kick     Hip pain    History of gout    Hyperlipidemia    Hypertension    Joint pain    Narcotic dependence ([place]) 02/11/2017   Neuromuscular disorder ([place])    PONV (postoperative nausea and vomiting)    Prediabetes 03/29/2017   Sleep apnea    no longer have to use cpap since wt loss   Stage 3 chronic kidney disease ([place]) 01/26/2018   Stroke ([place]) 2006   denies residual on 01/15/2015    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 ([place]) 08/01/2018   Past Surgical History:  Procedure Laterality Date   BARIATRIC SURGERY  2010   in [place]  02/26/2021   Procedure: BIOPSY;  Surgeon: Harvel Quale, MD;  [facility];  Service: Gastroenterology;;   CARDIOVERSION     COLONOSCOPY N/A 08/02/2014   Procedure: COLONOSCOPY;  Surgeon: Rogene Houston, MD;  [facility];  Service: Endoscopy;  Laterality: N/A;  210 - moved to 3/10 @ 11:55 - Ann notified pt   COLONOSCOPY WITH PROPOFOL N/A 02/26/2021   Procedure: COLONOSCOPY WITH PROPOFOL;  Surgeon: Harvel Quale, MD;  [facility];  Service: Gastroenterology;  Laterality: N/A;  10:00   ESOPHAGOGASTRODUODENOSCOPY     ESOPHAGOGASTRODUODENOSCOPY (EGD) WITH PROPOFOL N/A 02/26/2021   Procedure: ESOPHAGOGASTRODUODENOSCOPY (EGD) WITH PROPOFOL;  Surgeon: Harvel Quale, MD;  [facility];  Service: Gastroenterology;  Laterality: N/A;   HERNIA REPAIR     LIPOSUCTION     PANNICULECTOMY  01/14/2015   PANNICULECTOMY N/A 01/14/2015   Procedure:  TOTAL PANNICULECTOMY WTH LIPOSUCTION OF SIDES;  Surgeon: Cristine Polio,  MD;  Location: [place];  Service: Plastics;  Laterality: N/A;   POLYPECTOMY  02/26/2021   Procedure: POLYPECTOMY INTESTINAL;  Surgeon: Harvel Quale, MD;  [facility];  Service: Gastroenterology;;   TONSILLECTOMY     TOTAL HIP ARTHROPLASTY Left 12/16/2021   Procedure: TOTAL HIP ARTHROPLASTY ANTERIOR APPROACH;  Surgeon: Renette Butters, MD;  Location: [facility];  Service: Orthopedics;  Laterality: Left;   VENTRAL HERNIA REPAIR  01/14/2015   VENTRAL HERNIA REPAIR N/A 01/14/2015   Procedure: HERNIA REPAIR VENTRAL ADULT AND MUSCLE REPAIR;  Surgeon: Cristine Polio, MD;  Location: [place];  Service: Plastics;  Laterality: N/A;   Patient Active Problem List   Diagnosis Date Noted   S/P total left hip arthroplasty 12/16/2021   Need for  varicella vaccine 10/16/2021   OSA  09/11/2021   Pre-operative clearance 06/19/2021   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   Immunization due 02/06/2021   Iron deficiency anemia 12/30/2020   History of colonic polyps 12/30/2020   Unilateral primary osteoarthritis, right hip 08/13/2020   Unilateral primary osteoarthritis, left hip 08/13/2020   Muscle spasms of both lower extremities 05/23/2020   Need for immunization against influenza 03/06/2020   Primary osteoarthritis of both hips 07/10/2019   Bilateral primary osteoarthritis of hip 07/10/2019   Vitamin D deficiency 04/13/2019   Morbid obesity with body mass index (BMI) of 40.0 to 44.9 in adult ([place]) 04/13/2019   Depressive disorder 04/13/2019   Body mass index (BMI) 45.0-49.9, adult ([place]) 04/13/2019   Kidney disease, chronic, stage III (GFR 30-59 ml/min) ([place]) 07/20/2017   History of cerebrovascular accident 07/20/2017   Gastroesophageal reflux disease 07/20/2017   Gastric bypass status for obesity 02/11/2017   Benzodiazepine dependence ([place]) 02/11/2017   Type 2 diabetes mellitus with diabetic nephropathy ([place]) 08/03/2008   Hyperlipidemia 08/03/2008   High blood pressure 08/03/2008   SLEEP APNEA 08/03/2008   Sleep apnea 08/03/2008    PCP: Vena Rua FNP  REFERRING PROVIDER: Renette Butters, MD   REFERRING DIAG: post op lt THR per Fredonia Highland, MD DOS 12/16/21   THERAPY DIAG:  Pain in left hip  Muscle weakness (generalized)  Other abnormalities of gait and mobility  Other symptoms and signs involving the musculoskeletal system  Status post left hip replacement  Rationale for Evaluation and Treatment Rehabilitation  ONSET DATE: 12/16/21  SUBJECTIVE:   SUBJECTIVE STATEMENT: Patient states his hip is sore. Wasn't very sore after last time .  PERTINENT HISTORY: obesity, depression, advanced hip OA, history of morbid obesity (was 600 pounds), history of low  back pain  PAIN:  Are you having pain? Yes: NPRS scale: 3/10 Pain location: L hip Pain description: sore Aggravating factors:   Relieving factors: movement  PRECAUTIONS: None  WEIGHT BEARING RESTRICTIONS No  FALLS:  Has patient fallen in last 6 months? No  LIVING ENVIRONMENT: Lives with: lives alone Lives in: House/apartment Stairs: No Has following equipment at home: Single point cane, Environmental consultant - 2 wheeled, and Environmental consultant - 4 wheeled  OCCUPATION: Disability  PLOF: Independent  PATIENT GOALS get the leg better and improve walking   OBJECTIVE: (objective measures from initial evaluation unless otherwise dated) PATIENT SURVEYS:  LEFS 20/80  COGNITION:  Overall cognitive status: Within functional limits for tasks assessed     SENSATION: WFL  POSTURE: rounded shoulders and forward head  PALPATION: TTP grossly throughout L hip/quads  LOWER EXTREMITY ROM:  Active ROM Right eval Left eval  Hip  flexion    Hip extension    Hip abduction    Hip adduction    Hip internal rotation    Hip external rotation    Knee flexion    Knee extension    Ankle dorsiflexion    Ankle plantarflexion    Ankle inversion    Ankle eversion     (Blank rows = not tested)  LOWER EXTREMITY MMT:  MMT Right eval Left eval  Hip flexion 4- 3-  Hip extension    Hip abduction    Hip adduction    Hip internal rotation    Hip external rotation    Knee flexion 5 5  Knee extension 5 5  Ankle dorsiflexion 5 5  Ankle plantarflexion    Ankle inversion    Ankle eversion     (Blank rows = not tested)    FUNCTIONAL TESTS:  2 minute walk test: 150 feet with RW Transfers: labored with UE support Stair: 4 inch stairs with alternating pattern, requires bilateral UE support  GAIT: Distance walked: 150 Assistive device utilized: Environmental consultant - 2 wheeled Level of assistance: Modified independence Comments: 2MWT, limited L knee flexion/extension throughout, antalgic    TODAY'S  TREATMENT: 12/24/21 Bridge 2x 10 Hooklying clamshell GTB 2x 10  Bent knee fall outs 2x 10 LAQ 10 x 5 second holds LLE Supine heel slides 2 x 10 LLE Seated hip hip adduction isometric 10 x 10 seconds Standing hip abduction 2 x 10 bilateral  Standing hip extension 2x 10 bilateral  Seated march 2x 10 bilateral  Standing hamstring curls 2 x 10 bilateral  Mini squat 3x 10    12/22/21 Bridge 2x 10  Supine heel slides 2 x 10 with belt Hooklying hip abduction isometric 10 x 10 second holds Sideling clam 2x 10 LLE LAQ 10 x 5 second holds LLE HR 2x 10  Standing hip abduction 2 x 10 bilateral  Standing hip extension 2x 10 bilateral  Seated hip adduction isometric 10 x 10 second holds Seated march 2x 10 bilateral  Standing hamstring curls 2 x 10 bilateral  Mini squat 2 x 10    12/18/21 Ankle pumps x 20 Glute sets 10x 10  second holds Bridge 2x 10 Heel slides with belt 1  x 10    PATIENT EDUCATION:  Education details: 12/24/21 exercise mechanics, rest breaks when needed 12/22/21 HEP;  Eval: Patient educated on exam findings, POC, scope of PT, HEP, and exercise mechanics. Person educated: Patient Education method: Explanation, Demonstration, and Handouts Education comprehension: verbalized understanding, returned demonstration, verbal cues required, and tactile cues required   HOME EXERCISE PROGRAM: Access Code: [facility] 12/22/21 - Standing Hip Abduction with Counter Support  - 3 x daily - 7 x weekly - 2 sets - 10 reps - Mini Squat with Counter Support  - 3 x daily - 7 x weekly - 2 sets - 10 reps  Date: 12/18/2021 - Supine Ankle Pumps  - 3 x daily - 7 x weekly - 20 reps - Supine Gluteal Sets  - 3 x daily - 7 x weekly - 10 reps - 10 second hold - Supine Heel Slide with Strap  - 3 x daily - 7 x weekly - 2 sets - 10 reps - Seated Long Arc Quad (Mirrored)  - 3 x daily - 7 x weekly - 1 sets - 10 reps - 5 second hold  ASSESSMENT:  CLINICAL IMPRESSION: Patient requires intermittent  cueing for rest breaks as he tries to continue through pain.  Typically has increased pain with laying exercises and transitions. Overall tolerates session well with decreased symptoms later in session. Patient will continue to benefit from physical therapy in order to improve function and reduce impairment.     OBJECTIVE IMPAIRMENTS Abnormal gait, decreased activity tolerance, decreased balance, decreased endurance, decreased mobility, difficulty walking, decreased ROM, decreased strength, increased edema, increased muscle spasms, impaired flexibility, improper body mechanics, and pain.   ACTIVITY LIMITATIONS carrying, lifting, bending, standing, squatting, sleeping, stairs, transfers, bathing, dressing, hygiene/grooming, locomotion level, and caring for others  PARTICIPATION LIMITATIONS: meal prep, cleaning, laundry, shopping, community activity, and yard work  PERSONAL FACTORS Time since onset of injury/illness/exacerbation and 3+ comorbidities: obesity, depression, advanced hip OA, history of morbid obesity (was 600 pounds), history of low back pain  are also affecting patient's functional outcome.   REHAB POTENTIAL: Good  CLINICAL DECISION MAKING: Stable/uncomplicated  EVALUATION COMPLEXITY: Low   GOALS: Goals reviewed with patient? Yes  SHORT TERM GOALS: Target date: 01/08/2022  Patient will be independent with HEP in order to improve functional outcomes. Baseline:  Goal status: INITIAL  2.  Patient will report at least 25% improvement in symptoms for improved quality of life. Baseline:  Goal status: INITIAL   LONG TERM GOALS: Target date: 01/29/2022  Patient will report at least 75% improvement in symptoms for improved quality of life. Baseline:  Goal status: INITIAL  2.  Patient will improve LEFS core by at least 18 points in order to indicate improved tolerance to activity. Baseline: 20/80 Goal status: INITIAL  3.  Patient will be able to navigate standard stairs with  reciprocal pattern without compensation on LLE in order to demonstrate improved LE strength. Baseline: 4 inch steps with UE support Goal status: INITIAL  4.  Patient will be able to ambulate at least 350 feet with LRAD in 2MWT in order to demonstrate improved tolerance to activity. Baseline: 150 with RW Goal status: INITIAL  5.  Patient will demonstrate grade of 5/5 MMT grade in all tested musculature in LLE as evidence of improved strength to assist with stair ambulation and gait.   Baseline: see MMT Goal status: INITIAL    PLAN: PT FREQUENCY: 2x/week  PT DURATION: 6 weeks  PLANNED INTERVENTIONS: Therapeutic exercises, Therapeutic activity, Neuromuscular re-education, Balance training, Gait training, Patient/Family education, Joint manipulation, Joint mobilization, Stair training, Orthotic/Fit training, DME instructions, Aquatic Therapy, Dry Needling, Electrical stimulation, Spinal manipulation, Spinal mobilization, Cryotherapy, Moist heat, Compression bandaging, scar mobilization, Splintting, Taping, Traction, Ultrasound, Ionotophoresis '4mg'$ /ml Dexamethasone, and Manual therapy   PLAN FOR NEXT SESSION: f/u with HEP, left hip mobility and strength and progress as tolerated   Vianne Bulls Aydon Swamy, PT 12/24/2021, 1:39 PM

## 2021-12-29 ENCOUNTER — Encounter (HOSPITAL_COMMUNITY): Payer: Self-pay

## 2021-12-29 ENCOUNTER — Ambulatory Visit (HOSPITAL_COMMUNITY): Payer: Medicaid Other

## 2021-12-29 DIAGNOSIS — M25552 Pain in left hip: Secondary | ICD-10-CM | POA: Diagnosis not present

## 2021-12-29 DIAGNOSIS — R2689 Other abnormalities of gait and mobility: Secondary | ICD-10-CM | POA: Diagnosis not present

## 2021-12-29 DIAGNOSIS — M6281 Muscle weakness (generalized): Secondary | ICD-10-CM

## 2021-12-29 DIAGNOSIS — R29898 Other symptoms and signs involving the musculoskeletal system: Secondary | ICD-10-CM

## 2021-12-29 DIAGNOSIS — Z96642 Presence of left artificial hip joint: Secondary | ICD-10-CM

## 2021-12-29 NOTE — Therapy (Signed)
OUTPATIENT PHYSICAL THERAPY TREATMENT   Patient Name: [name] MRN: [id number] DOB:[birth year], 51 y.o., male Today's Date: 12/29/2021   PT End of Session - 12/29/21 1338     Visit Number 4    Number of Visits 12    Date for PT Re-Evaluation 01/29/22    Authorization Type Medicaid [employer]    Authorization Time Period 8 visits approved 12/18/21 -02/15/22 - will need to request more if needed    Authorization - Visit Number 3    Authorization - Number of Visits 8    PT Start Time 1340    PT Stop Time 1420    PT Time Calculation (min) 40 min    Equipment Utilized During Treatment Gait belt    Activity Tolerance Patient tolerated treatment well    Behavior During Therapy WFL for tasks assessed/performed             Past Medical History:  Diagnosis Date   ALLERGY 08/03/2008   Qualifier: Diagnosis of  By: Christ Kick     Anemia    Arthritis    "hips" (01/15/2015)   Blood transfusion without reported diagnosis    Cellulitis 07/25/2018   Chronic back pain greater than 3 months duration 02/11/2017   Chronic lower back pain    Colon polyps    COLONIC POLYPS, ADENOMATOUS 08/03/2008   Qualifier: Diagnosis of  By: Christ Kick     CVA 08/03/2008   Qualifier: History of  By: Christ Kick     Depression    denies   Diabetes mellitus    diet controlled- no meds since wt loss   Diabetes mellitus ([place]) 07/10/2019   Dysrhythmia    s/p surgery bariatric- cardioversion   Erectile dysfunction 01/26/2018   GERD (gastroesophageal reflux disease)    HIATAL HERNIA 08/03/2008   Qualifier: Diagnosis of  By: Christ Kick     Hip pain    History of gout    Hyperlipidemia    Hypertension    Joint pain    Narcotic dependence ([place]) 02/11/2017   Neuromuscular disorder ([place])    PONV (postoperative nausea and vomiting)    Prediabetes 03/29/2017   Sleep apnea    no longer have to use cpap since wt loss   Stage 3 chronic kidney disease ([place]) 01/26/2018    Stroke ([place]) 2006   denies residual on 01/15/2015   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 ([place]) 08/01/2018   Past Surgical History:  Procedure Laterality Date   BARIATRIC SURGERY  2010   in [place]  02/26/2021   Procedure: BIOPSY;  Surgeon: Harvel Quale, MD;  [facility];  Service: Gastroenterology;;   CARDIOVERSION     COLONOSCOPY N/A 08/02/2014   Procedure: COLONOSCOPY;  Surgeon: Rogene Houston, MD;  [facility];  Service: Endoscopy;  Laterality: N/A;  210 - moved to 3/10 @ 11:55 - Ann notified pt   COLONOSCOPY WITH PROPOFOL N/A 02/26/2021   Procedure: COLONOSCOPY WITH PROPOFOL;  Surgeon: Harvel Quale, MD;  [facility];  Service: Gastroenterology;  Laterality: N/A;  10:00   ESOPHAGOGASTRODUODENOSCOPY     ESOPHAGOGASTRODUODENOSCOPY (EGD) WITH PROPOFOL N/A 02/26/2021   Procedure: ESOPHAGOGASTRODUODENOSCOPY (EGD) WITH PROPOFOL;  Surgeon: Harvel Quale, MD;  [facility];  Service: Gastroenterology;  Laterality: N/A;   HERNIA REPAIR     LIPOSUCTION     PANNICULECTOMY  01/14/2015   PANNICULECTOMY N/A 01/14/2015   Procedure:  TOTAL  PANNICULECTOMY WTH LIPOSUCTION OF SIDES;  Surgeon: Cristine Polio, MD;  Location: [facility];  Service: Plastics;  Laterality: N/A;   POLYPECTOMY  02/26/2021   Procedure: POLYPECTOMY INTESTINAL;  Surgeon: Harvel Quale, MD;  [facility];  Service: Gastroenterology;;   TONSILLECTOMY     TOTAL HIP ARTHROPLASTY Left 12/16/2021   Procedure: TOTAL HIP ARTHROPLASTY ANTERIOR APPROACH;  Surgeon: Renette Butters, MD;  Location: [facility];  Service: Orthopedics;  Laterality: Left;   VENTRAL HERNIA REPAIR  01/14/2015   VENTRAL HERNIA REPAIR N/A 01/14/2015   Procedure: HERNIA REPAIR VENTRAL ADULT AND MUSCLE REPAIR;  Surgeon: Cristine Polio, MD;  Location: [facility];  Service: Plastics;  Laterality: N/A;   Patient Active Problem List   Diagnosis Date Noted   S/P  total left hip arthroplasty 12/16/2021   Need for varicella vaccine 10/16/2021   OSA  09/11/2021   Pre-operative clearance 06/19/2021   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   Immunization due 02/06/2021   Iron deficiency anemia 12/30/2020   History of colonic polyps 12/30/2020   Unilateral primary osteoarthritis, right hip 08/13/2020   Unilateral primary osteoarthritis, left hip 08/13/2020   Muscle spasms of both lower extremities 05/23/2020   Need for immunization against influenza 03/06/2020   Primary osteoarthritis of both hips 07/10/2019   Bilateral primary osteoarthritis of hip 07/10/2019   Vitamin D deficiency 04/13/2019   Morbid obesity with body mass index (BMI) of 40.0 to 44.9 in adult ([place]) 04/13/2019   Depressive disorder 04/13/2019   Body mass index (BMI) 45.0-49.9, adult ([place]) 04/13/2019   Kidney disease, chronic, stage III (GFR 30-59 ml/min) ([place]) 07/20/2017   History of cerebrovascular accident 07/20/2017   Gastroesophageal reflux disease 07/20/2017   Gastric bypass status for obesity 02/11/2017   Benzodiazepine dependence ([place]) 02/11/2017   Type 2 diabetes mellitus with diabetic nephropathy ([place]) 08/03/2008   Hyperlipidemia 08/03/2008   High blood pressure 08/03/2008   SLEEP APNEA 08/03/2008   Sleep apnea 08/03/2008    PCP: Vena Rua FNP  REFERRING PROVIDER: Renette Butters, MD   REFERRING DIAG: post op lt THR per Fredonia Highland, MD DOS 12/16/21   THERAPY DIAG:  Pain in left hip  Muscle weakness (generalized)  Other abnormalities of gait and mobility  Other symptoms and signs involving the musculoskeletal system  Status post left hip replacement  Rationale for Evaluation and Treatment Rehabilitation  ONSET DATE: 12/16/21  SUBJECTIVE:   SUBJECTIVE STATEMENT: Patient states his hip is sore and his opp hip is bothering him as well. States he has a cardio machine at home. Reports only taking  tylenol for pain and using his muscle relaxer.   PERTINENT HISTORY: obesity, depression, advanced hip OA, history of morbid obesity (was 600 pounds), history of low back pain  PAIN:   also 4/10 on right hip Are you having pain? Yes: NPRS scale: 3/10 Pain location: L hip Pain description: sore Aggravating factors:   Relieving factors: movement  PRECAUTIONS: None  WEIGHT BEARING RESTRICTIONS No  FALLS:  Has patient fallen in last 6 months? No  LIVING ENVIRONMENT: Lives with: lives alone Lives in: House/apartment Stairs: No Has following equipment at home: Single point cane, Environmental consultant - 2 wheeled, and Environmental consultant - 4 wheeled  OCCUPATION: Disability  PLOF: Independent  PATIENT GOALS get the leg better and improve walking   OBJECTIVE: (objective measures from initial evaluation unless otherwise dated) PATIENT SURVEYS:  LEFS 20/80  COGNITION:  Overall cognitive status: Within functional  limits for tasks assessed     SENSATION: WFL  POSTURE: rounded shoulders and forward head  PALPATION: TTP grossly throughout L hip/quads  LOWER EXTREMITY ROM:  Active ROM Right eval Left eval  Hip flexion    Hip extension    Hip abduction    Hip adduction    Hip internal rotation    Hip external rotation    Knee flexion    Knee extension    Ankle dorsiflexion    Ankle plantarflexion    Ankle inversion    Ankle eversion     (Blank rows = not tested)  LOWER EXTREMITY MMT:  MMT Right eval Left eval  Hip flexion 4- 3-  Hip extension    Hip abduction    Hip adduction    Hip internal rotation    Hip external rotation    Knee flexion 5 5  Knee extension 5 5  Ankle dorsiflexion 5 5  Ankle plantarflexion    Ankle inversion    Ankle eversion     (Blank rows = not tested)    FUNCTIONAL TESTS:  2 minute walk test: 150 feet with RW Transfers: labored with UE support Stair: 4 inch stairs with alternating pattern, requires bilateral UE support  GAIT: Distance walked:  150 Assistive device utilized: Environmental consultant - 2 wheeled Level of assistance: Modified independence Comments: 2MWT, limited L knee flexion/extension throughout, antalgic    TODAY'S TREATMENT: 12/29/21  Warm up and consitent AROM for hip using Nustep level 2 five mins, seat level 12  There-ex = in // bars    - Standing Heel raises B LE x 20    - Standing toe raises B LE x 20    - Standing hip abduction B LE each 2 sets x 10 reps   - Standing hip flexion marching knee up x 10 reps standing on R LE   - Mini squats 3 x 10 reps with cue for increased left weight shift onto L LE, B UE hands mod use    - Standing hip extension B LE each 2 sets x 10 reps    - Standing black foam normal BOS x 60 sec, narrow BOS x 30 sec with cue for equal WB, shift to left as able  There-ex = sitting    - hip adduction iso verse blue gym ball x 25 reps   - LAQ 10 x 2-3 sec hold L LE with 2lb weight cue for distal quad activation hold     12/24/21 Bridge 2x 10 Hooklying clamshell GTB 2x 10  Bent knee fall outs 2x 10 LAQ 10 x 5 second holds LLE Supine heel slides 2 x 10 LLE Seated hip hip adduction isometric 10 x 10 seconds Standing hip abduction 2 x 10 bilateral  Standing hip extension 2x 10 bilateral  Seated march 2x 10 bilateral  Standing hamstring curls 2 x 10 bilateral  Mini squat 3x 10    12/22/21 Bridge 2x 10  Supine heel slides 2 x 10 with belt Hooklying hip abduction isometric 10 x 10 second holds Sideling clam 2x 10 LLE LAQ 10 x 5 second holds LLE HR 2x 10  Standing hip abduction 2 x 10 bilateral  Standing hip extension 2x 10 bilateral  Seated hip adduction isometric 10 x 10 second holds Seated march 2x 10 bilateral  Standing hamstring curls 2 x 10 bilateral  Mini squat 2 x 10    12/18/21 Ankle pumps x 20 Glute sets 10x 10  second holds  Bridge 2x 10 Heel slides with belt 1  x 10    PATIENT EDUCATION:  Education details: 12/24/21 exercise mechanics, rest breaks when needed 12/22/21 HEP;   Eval: Patient educated on exam findings, POC, scope of PT, HEP, and exercise mechanics. Person educated: Patient Education method: Explanation, Demonstration, and Handouts Education comprehension: verbalized understanding, returned demonstration, verbal cues required, and tactile cues required   HOME EXERCISE PROGRAM: Access Code: [facility] 12/22/21 - Standing Hip Abduction with Counter Support  - 3 x daily - 7 x weekly - 2 sets - 10 reps - Mini Squat with Counter Support  - 3 x daily - 7 x weekly - 2 sets - 10 reps  Date: 12/18/2021 - Supine Ankle Pumps  - 3 x daily - 7 x weekly - 20 reps - Supine Gluteal Sets  - 3 x daily - 7 x weekly - 10 reps - 10 second hold - Supine Heel Slide with Strap  - 3 x daily - 7 x weekly - 2 sets - 10 reps - Seated Long Arc Quad (Mirrored)  - 3 x daily - 7 x weekly - 1 sets - 10 reps - 5 second hold  ASSESSMENT:  CLINICAL IMPRESSION: Today's session continued to build left hip support with increased activity for endurance and strengthening.  Showing increased opposite side pain secondary to overload off surgical side, thus session focused on cueing and increasing work into left weight shift as able. New balance activities for isometric support around left hip utilized as well to promote left hip support and trust. Patient will continue to benefit from physical therapy in order to improve function and reduce impairment.     OBJECTIVE IMPAIRMENTS Abnormal gait, decreased activity tolerance, decreased balance, decreased endurance, decreased mobility, difficulty walking, decreased ROM, decreased strength, increased edema, increased muscle spasms, impaired flexibility, improper body mechanics, and pain.   ACTIVITY LIMITATIONS carrying, lifting, bending, standing, squatting, sleeping, stairs, transfers, bathing, dressing, hygiene/grooming, locomotion level, and caring for others  PARTICIPATION LIMITATIONS: meal prep, cleaning, laundry, shopping, community  activity, and yard work  PERSONAL FACTORS Time since onset of injury/illness/exacerbation and 3+ comorbidities: obesity, depression, advanced hip OA, history of morbid obesity (was 600 pounds), history of low back pain  are also affecting patient's functional outcome.   REHAB POTENTIAL: Good  CLINICAL DECISION MAKING: Stable/uncomplicated  EVALUATION COMPLEXITY: Low   GOALS: Goals reviewed with patient? Yes  SHORT TERM GOALS: Target date: 01/08/2022  Patient will be independent with HEP in order to improve functional outcomes. Baseline:  Goal status: IN PROGRESS  2.  Patient will report at least 25% improvement in symptoms for improved quality of life. Baseline:  Goal status: IN PROGRESS   LONG TERM GOALS: Target date: 01/29/2022  Patient will report at least 75% improvement in symptoms for improved quality of life. Baseline:  Goal status: IN PROGRESS  2.  Patient will improve LEFS core by at least 18 points in order to indicate improved tolerance to activity. Baseline: 20/80 Goal status: IN PROGRESS  3.  Patient will be able to navigate standard stairs with reciprocal pattern without compensation on LLE in order to demonstrate improved LE strength. Baseline: 4 inch steps with UE support Goal status: IN PROGRESS  4.  Patient will be able to ambulate at least 350 feet with LRAD in 2MWT in order to demonstrate improved tolerance to activity. Baseline: 150 with RW Goal status: IN PROGRESS  5.  Patient will demonstrate grade of 5/5 MMT grade in all tested musculature  in LLE as evidence of improved strength to assist with stair ambulation and gait.   Baseline: see MMT Goal status: IN PROGRESS    PLAN: PT FREQUENCY: 2x/week  PT DURATION: 6 weeks  PLANNED INTERVENTIONS: Therapeutic exercises, Therapeutic activity, Neuromuscular re-education, Balance training, Gait training, Patient/Family education, Joint manipulation, Joint mobilization, Stair training, Orthotic/Fit  training, DME instructions, Aquatic Therapy, Dry Needling, Electrical stimulation, Spinal manipulation, Spinal mobilization, Cryotherapy, Moist heat, Compression bandaging, scar mobilization, Splintting, Taping, Traction, Ultrasound, Ionotophoresis '4mg'$ /ml Dexamethasone, and Manual therapy   PLAN FOR NEXT SESSION: f/u with HEP, left hip mobility and strength and progress as tolerated   Jamse Belfast, PT 12/29/2021, 2:18 PM

## 2021-12-30 ENCOUNTER — Other Ambulatory Visit: Payer: Self-pay | Admitting: Obstetrics and Gynecology

## 2021-12-30 NOTE — Patient Instructions (Signed)
Hey [name], I am so glad that you are doing well after your surgery-have a great afternoon!  Mr. Birky was given information about Medicaid Managed Care team care coordination services as a part of their Healthy [facility] Medicaid benefit. [name] verbally consented to engagement with the [facility] Managed Care team.   If you are experiencing a medical emergency, please call 911 or report to your local emergency department or urgent care.   If you have a non-emergency medical problem during routine business hours, please contact your provider's office and ask to speak with a nurse.   For questions related to your Healthy [facility] health plan, please call: [phone] or visit the homepage here: [email or web address]  If you would like to schedule transportation through your Healthy [facility] plan, please call the following number at least 2 days in advance of your appointment: [phone]  For information about your ride after you set it up, call Ride Assist at [phone]. Use this number to activate a Will Call pickup, or if your transportation is late for a scheduled pickup. Use this number, too, if you need to make a change or cancel a previously scheduled reservation.  If you need transportation services right away, call [phone]. The after-hours call center is staffed 24 hours to handle ride assistance and urgent reservation requests (including discharges) 365 days a year. Urgent trips include sick visits, hospital discharge requests and life-sustaining treatment.  Call the [place] at [phone], at any time, 24 hours a day, 7 days a week. If you are in danger or need immediate medical attention call 911.  If you would like help to quit smoking, call 1-800-QUIT-NOW 205-880-7482) OR Espaol: 1-855-Djelo-Ya (0-233-435-6861) o para ms informacin haga clic aqu or Text READY to 200-400 to register via  text  [name] - following are the goals we discussed in your visit today:   Goals Addressed    Timeframe:  Long-Range Goal Priority:  High Start Date:     10/21/21                        Expected End Date:  ongoing                     Follow Up Date: 02/09/22   - schedule appointment for vaccines needed due to my age or health - schedule recommended health tests (blood work, mammogram, colonoscopy, pap test) - schedule and keep appointment for annual check-up    Why is this important?   Screening tests can find diseases early when they are easier to treat.  Your doctor or nurse will talk with you about which tests are important for you.  Getting shots for common diseases like the flu and shingles will help prevent them.  12/30/21:  Patient currently undergoing PT S/P left hip replacement  Patient verbalizes understanding of instructions and care plan provided today and agrees to view in [place]. Active MyChart status and patient understanding of how to access instructions and care plan via MyChart confirmed with patient.     The Managed Medicaid care management team will reach out to the patient again over the next 30 business  days.  The  Patient has been provided with contact information for the Managed Medicaid care management team and has been advised to call with any health related questions or concerns.   [name] RN, BSN [place]  [facility] [employer] -  Managed Medicaid High Risk [phone]   Following is a copy of your plan of care:  Care Plan : [place]  Updates made by Gayla Medicus, RN since 12/30/2021 12:00 AM     Problem: Chronic Disease Management and Care Coordination Needs for DM, HTN, HLD, CKD      Long-Range Goal: Development of Plan Of Care For Chronic Disease Management and Care Coordination Needs to Assist With Meeting Treatment Goals for DM, CKD, HTN, obesity, chronic hip pain   Start Date:  01/07/2021  Expected End Date: 04/01/2022  Priority: High  Note:   Current Barriers:  Knowledge Deficits related to plan of care for management of HTN, HLD, CKD, and DMII, chronic pain, obesity Chronic Disease Management support and education needs related to HTN, HLD, and DMII, chronic pain 12/30/21:  Patient s/p left hip replacement and doing well-attending outpatient PT twice a week.  Hopes to have right hip replacement next.  Patient now has CPAP and trying to get adjusted to it, wears every night for several hours.  BP and BG stable.  RNCM Clinical Goal(s):  Patient will verbalize understanding of plan for management of HTN, HLD, CKD and DMII take all medications exactly as prescribed and will call provider for medication related questions attend all scheduled medical appointments:  demonstrate ongoing adherence to prescribed treatment plan for HTN, HLD, CKD and DMII as evidenced by daily/ twice daily monitoring and recording of CBG,  adherence to ADA/ carb modified, fat modified, no added salt diet, continue 30-40 minutes of exercise on your NuStep 5-7 days/week, adherence to prescribed medication regimen including new diabetes medication,  contacting provider for new or worsened symptoms or questions related to HTN, DM or HLD, CKD continue to work with Consulting civil engineer and managed Medicaid pharmacist to address care management and care coordination needs related to HTN, HLD, and DMII , CKD  Interventions: Inter-disciplinary care team collaboration (see longitudinal plan of care) Evaluation of current treatment plan related to  self management and patient's adherence to plan as established by provider Reviewed appointments and insured patient has transportation to all appointments   Chronic Kidney Disease (Status: Goal on Track (progressing): YES.) - all recent provider BP readings are meeting treatment targets, patient reports his nephrologist was pleased that his creatinine has stabilized and  that his BP reading during office visit was good ,  patient states he checks his BP occasionally and that readings are usually <130/<80 Last practice recorded BP readings:    BP Readings from Last 3 Encounters:  08/01/21 107/76  06/26/21 110/76  06/19/21 116/76         Most recent eGFR/CrCl:  Lab Results  Component Value Date   NA 137 05/15/2021   K 4.1 05/15/2021   CREATININE 1.63 (H) 05/15/2021   EGFR 51 (L) 05/15/2021   GFRNONAA 37 (L) 01/10/2020   GLUCOSE 128 (H) 05/15/2021    Reviewed medications with patient and discussed importance of compliance   Assessed frequency of self monitored BP, reviewed home and provider office readings, reviewed treatment targets and reviewed lifestyle strategies   Reviewed addition of Farxiga to medication regimen for blood pressure, blood sugar and weight control in addition to slowing progression of CKD and lowering risks of heart attack, stroke and cardiac death , assessed tolerance of Farxiga and reviewed major adverse side effects   Reviewed scheduled/upcoming provider appointments  Discussed plans with patient for ongoing care management follow up and provided patient with direct  contact information for care management team    Diabetes:  (Status: Goal on track: YES.)- patient reports self monitored fasting CBGs are usually <130, checks 1-2 times a day.  Most recent A1C=6.1 on 10/17/21. Lab Results  Component Value Date   HGBA1C 7.0 (H) 05/15/2021  Assessed frequency of CBG testing and reviewed fasting target Reviewed medications, including recent addition of Farxiga to medication regimen with patient and discussed importance of good medication taking behavior Discussed plans with patient for ongoing care management follow up and provided patient with direct contact information for care management team;               Health Maintenance (Status: Condition stable. Not addressed this visit.) - patient completed colonoscopy and endoscopy on 02/26/21  and was aware of pathology report- non cancerous Patient interviewed about adult health maintenance status   Pneumonia Vaccine-  received pneumococcal  vaccine on 05/15/21 Influenza Vaccine- states he received flu shot on 02/06/21 COVID vaccination   - pt states he never received  Shingles Vaccination- received first dose on 02/06/21, did not receive 2nd vaccination so will message provider prior to his appointment on May 11,2023-second shingles vaccine received 10/17/21 Regular eye checkups- says he sees his eye doctor, Dr [name],  every 6 months due to glaucoma and his next appointment is in November  Advanced Directives- states he does not have and does not want information at this time  Dental Care- states his dentist is Dr Mina Marble in [place] and he sees her once-twice yearly but he is currently overdue for check up and cleaning, emphasized importance of regular dental care especially with his diagnosis of DM      Hyperlipidemia:  (Status: Condition stable. Not addressed this visit. Lab Results  Component Value Date   CHOL 162 05/15/2021   HDL 48 05/15/2021   LDLCALC 88 05/15/2021   TRIG 147 05/15/2021   CHOLHDL 6.1 (H) 12/20/2019     Medication review performed; medication list updated in electronic medical record.   Hypertension: (Status: Goal on Track (progressing): YES.)-patient states he checks his BP occasionally and that readings are usually <130/<80, he says provider office readings are higher at times because he is in pain from the walk to the offices. 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 Last practice recorded BP readings:  BP Readings from Last 3 Encounters:  08/01/21 107/76  06/26/21 110/76  06/19/21 116/76    Most recent eGFR/CrCl:  Lab Results  Component Value Date   NA 137 05/15/2021   K 4.1 05/15/2021   CREATININE 1.63 (H) 05/15/2021   EGFR 51 (L) 05/15/2021   GFRNONAA 37 (L) 01/10/2020   GLUCOSE 128 (H)  05/15/2021   Evaluation of current treatment plan related to hypertension self management and patient's adherence to plan as established by provider;   Reviewed BP medications and assessed medication taking behavior ensuring he is taking Losartan that was recently added to his regimen Assessed frequency of self monitored blood press and readings, reviewed treatment targets Discussed plans with patient for ongoing care management follow up and provided patient with direct contact information for care management team; Reviewed scheduled/upcoming provider appointments  Pain:  (Status: Goal on track: NO.)patient states bilateral hip pain continues and requires regular injections and oxycodone to control, says he must find a new pain management provider as Dr. [name] no longer accepts [facility]. Medications reviewed Reviewed provider established plan for pain management; Counseled on the  importance of reporting any/all new or changed pain symptoms or management strategies to pain management provider; Positive reinforcement given to patient regarding ongoing weight loss to meet goal to qualify for bilateral hip surgery  Weight Loss Interventions:  (Status:  New goal.) Long Term Goal- pt and gastric bypass in 2010 in [place], currently meeting with RD, CDCES [name] to assist with meeting goal weight of 255 lbs or less so that he may proceed with bilateral hip surgery, patient says he only drinks water but struggles with concentrated sweet consumption although the Ozempic has also helped with his CHO cravings, unfortunately patient canceled follow up appointment with dietician on 08/19/21 and has not rescheduled. Provided verbal and/or written education to patient re: provider recommended life style modifications  Reviewed recommended dietary changes: avoid fad diets, make small/incremental dietary and exercise changes, eat at the table and avoid eating in front of the TV, plan  management of cravings, monitor snacking and cravings in food diary Congratulated patient on good medication taking behavior, ongoing weight loss  and 30-40 minutes of Nu Step home exercise each evening Reviewed [place] benefit related to assisting with weight loss Encouraged patient to reschedule with [name] for medical nutrition therapy  Patient Goals/Self-Care Activities: Take medications as prescribed   Attend all scheduled provider appointments Call pharmacy for medication refills 3-7 days in advance of running out of medications Perform all self care activities independently  Perform IADL's (shopping, preparing meals, housekeeping, managing finances) independently Call provider office for new concerns or questions  Call and schedule dentist appointment with [name] for check up and cleaning as soon as possible , phone number [phone] Reschedule a follow up appointment with [name], dietician and [facility], to help with ongoing weight loss

## 2021-12-30 NOTE — Patient Outreach (Signed)
Medicaid Managed Care   Nurse Care Manager Note  12/30/2021 Name:  [name] MRN:  [id number] DOB:  05-28-1970  [name] is an 51 y.o. year old male who is a primary patient of [name] Rival, FNP.  The [facility] Managed Care Coordination team was consulted for assistance with:    Chronic healthcare management needs, HTN, DM, chronic pain, CKD, osteoarthritis, HLD  [name] was given information about Medicaid Managed Care Coordination team services today. [name] Patient agreed to services and verbal consent obtained.  Engaged with patient by telephone for follow up visit in response to provider referral for case management and/or care coordination services.   Assessments/Interventions:  Review of past medical history, allergies, medications, health status, including review of consultants reports, laboratory and other test data, was performed as part of comprehensive evaluation and provision of chronic care management services.  SDOH (Social Determinants of Health) assessments and interventions performed: SDOH Interventions    Flowsheet Row Most Recent Value  SDOH Interventions   Financial Strain Interventions Intervention Not Indicated     Care Plan  No Known Allergies  Medications Reviewed Today     Reviewed by Gayla Medicus, RN (Registered Nurse) on 12/30/21 at 1414  Med List Status: <None>   Medication Order Taking? Sig Documenting Provider Last Dose Status Informant  acetaminophen (TYLENOL) 500 MG tablet [id number]  Take 2 tablets (1,000 mg total) by mouth every 6 (six) hours as needed for mild pain or moderate pain. [name] M, PA-C  Active   atorvastatin (LIPITOR) 40 MG tablet [id number] No Take 1 tablet (40 mg total) by mouth daily. [name], NP 12/16/2021 0230 Active Self           Med Note [name], [name] E   Tue Aug 19, 2021  9:00 AM)    blood glucose meter kit and supplies KIT [id number] No Test daily Dx  r73.03 [name], DO  12/15/2021 Active Self  Blood Pressure Monitor KIT [id number] No 1 each by Does not apply route daily. [name], NP 12/15/2021 Active Self  FARXIGA 5 MG TABS tablet [id number] No Take 5 mg by mouth every morning. [provider] Past Week Active Self  ferrous sulfate 325 (65 FE) MG tablet [id number] No Take 65 mg by mouth every 3 (three) days. [provider] Past Week Active   gabapentin (NEURONTIN) 300 MG capsule [id number] No Take 1 capsule (300 mg total) by mouth at bedtime. [name] Rival, FNP 12/15/2021 Active Self  glucose blood test strip [id number] No Use as instructed. Can check up to 4 times daily. [name], NP 12/15/2021 Active Self  KERENDIA 10 MG TABS [id number] No Take 10 mg by mouth daily. [provider] 12/16/2021 0230 Active Self  latanoprost (XALATAN) 0.005 % ophthalmic solution [id number] No Place 1 drop into both eyes at bedtime. [provider] 12/15/2021 Active Self  losartan (COZAAR) 25 MG tablet [id number] No Take 1 tablet (25 mg total) by mouth daily. [name], MD 12/16/2021 0230 Active Self  methocarbamol (ROBAXIN-750) 750 MG tablet [id number]  Take 1 tablet (750 mg total) by mouth every 8 (eight) hours as needed for muscle spasms. [name] M, PA-C  Active   Multiple Vitamin (MULTIVITAMIN) tablet [id number] No Take 1 tablet by mouth daily. One a day [provider] Past Week Active Self  NARCAN 4 MG/0.1ML LIQD nasal spray kit [id number] No Place 0.4 mg into the nose once. [provider] Unknown Active Self           Med Note [name], [name] E   Tue Aug 19, 2021  9:06 AM)    ondansetron (ZOFRAN-ODT) 4 MG disintegrating tablet [id number]  Take 1 tablet (4 mg total) by mouth 2 (two) times daily as needed for nausea or vomiting. [name] M, PA-C  Active   Oxycodone HCl 10 MG TABS [id number]  Take 0.5-1 tablets (5-10 mg total) by mouth every 8 (eight) hours as needed (for severe pain after surgery not solved  with your normal daily Oxycodone dose). [name] M, PA-C  Active   Oxycodone HCl 20 MG TABS [id number] No Take 20 mg by mouth 4 (four) times daily as needed (pain). [provider] 12/15/2021 Active Self           Med Note Dina [name], [name]   Thu Nov 27, 2021 12:05 PM)    polyethylene glycol (MIRALAX) 17 g packet [id number]  Take 17 g by mouth daily. to prevent constipation [name] M, PA-C  Active   rivaroxaban (XARELTO) 10 MG TABS tablet [id number]  Take 1 tablet (10 mg total) by mouth daily. to prevent blood clots after surgery. YOU MUST TAKE THIS MEDICINE! Britt Bottom, PA-C  Active   Semaglutide, 2 MG/DOSE, 8 MG/3ML SOPN [id number] No Inject 2 mg as directed once a week.  Patient taking differently: Inject 2 mg as directed every Wednesday.   [name], NP Past Week Active Self           Med Note Wilmon [name], [name] R   Thu Nov 27, 2021 10:48 AM) Ozempic  sildenafil (VIAGRA) 50 MG tablet [id number] No Take 1 tablet (50 mg total) by mouth daily as needed for erectile dysfunction. [name] Rival, FNP Unknown Active Self  timolol (BETIMOL) 0.5 % ophthalmic solution [id number] No Place 1 drop into both eyes in the morning. [provider] 12/16/2021 0230 Active Self  vitamin B-12 (CYANOCOBALAMIN) 1000 MCG tablet [id number] No Take 1,000 mcg by mouth daily. [provider] Past Week Active Self           Patient Active Problem List   Diagnosis Date Noted   S/P total left hip arthroplasty 12/16/2021   Need for varicella vaccine 10/16/2021   OSA  09/11/2021   Pre-operative clearance 06/19/2021   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   Immunization due 02/06/2021   Iron deficiency anemia 12/30/2020   History of colonic polyps 12/30/2020   Unilateral primary osteoarthritis, right hip 08/13/2020   Unilateral primary osteoarthritis, left hip 08/13/2020   Muscle spasms of both lower extremities 05/23/2020    Need for immunization against influenza 03/06/2020   Primary osteoarthritis of both hips 07/10/2019   Bilateral primary osteoarthritis of hip 07/10/2019   Vitamin D deficiency 04/13/2019   Morbid obesity with body mass index (BMI) of 40.0 to 44.9 in adult ([place]) 04/13/2019   Depressive disorder 04/13/2019   Body mass index (BMI) 45.0-49.9, adult ([place]) 04/13/2019   Kidney disease, chronic, stage III (GFR 30-59 ml/min) ([place]) 07/20/2017   History of cerebrovascular accident 07/20/2017   Gastroesophageal reflux disease 07/20/2017   Gastric bypass status for obesity 02/11/2017   Benzodiazepine dependence ([place]) 02/11/2017   Type 2 diabetes mellitus with diabetic nephropathy ([place]) 08/03/2008   Hyperlipidemia 08/03/2008   High blood pressure 08/03/2008   SLEEP APNEA 08/03/2008   Sleep apnea 08/03/2008   Conditions to be addressed/monitored per PCP order:  Chronic healthcare management needs, HTN, DM, chronic pain, CKD, osteoarthritis, HLD  Care Plan : RN Care Manager Plan Of Care  Updates made by Gayla Medicus, RN since 12/30/2021 12:00 AM     Problem: Chronic Disease Management and Care Coordination Needs for DM, HTN, HLD, CKD      Long-Range Goal: Development of Plan Of Care For Chronic Disease Management and Care Coordination Needs to Assist With Meeting Treatment Goals for DM, CKD, HTN, obesity, chronic hip pain   Start Date: 01/07/2021  Expected End Date: 04/01/2022  Priority: High  Note:   Current Barriers:  Knowledge Deficits related to plan of care for management of HTN, HLD, CKD, and DMII, chronic pain, obesity Chronic Disease Management support and education needs related to HTN, HLD, and DMII, chronic pain 12/30/21:  Patient s/p left hip replacement and doing well-attending outpatient PT twice a week.  Hopes to have right hip replacement next.  Patient now has CPAP and trying to get adjusted to it, wears every night for several hours.  BP and BG stable.  RNCM Clinical Goal(s):   Patient will verbalize understanding of plan for management of HTN, HLD, CKD and DMII take all medications exactly as prescribed and will call provider for medication related questions attend all scheduled medical appointments:  demonstrate ongoing adherence to prescribed treatment plan for HTN, HLD, CKD and DMII as evidenced by daily/ twice daily monitoring and recording of CBG,  adherence to ADA/ carb modified, fat modified, no added salt diet, continue 30-40 minutes of exercise on your NuStep 5-7 days/week, adherence to prescribed medication regimen including new diabetes medication,  contacting provider for new or worsened symptoms or questions related to HTN, DM or HLD, CKD continue to work with Consulting civil engineer and managed Medicaid pharmacist to address care management and care coordination needs related to HTN, HLD, and DMII , CKD  Interventions: Inter-disciplinary care team collaboration (see longitudinal plan of care) Evaluation of current treatment plan related to  self management and patient's adherence to plan as established by provider Reviewed appointments and insured patient has transportation to all appointments   Chronic Kidney Disease (Status: Goal on Track (progressing): YES.) - all recent provider BP readings are meeting treatment targets, patient reports his nephrologist was pleased that his creatinine has stabilized and that his BP reading during office visit was good ,  patient states he checks his BP occasionally and that readings are usually <130/<80 Last practice recorded BP readings:    BP Readings from Last 3 Encounters:  08/01/21 107/76  06/26/21 110/76  06/19/21 116/76         Most recent eGFR/CrCl:  Lab Results  Component Value Date   NA 137 05/15/2021   K 4.1 05/15/2021   CREATININE 1.63 (H) 05/15/2021   EGFR 51 (L) 05/15/2021   GFRNONAA 37 (L) 01/10/2020   GLUCOSE 128 (H) 05/15/2021    Reviewed medications with patient and discussed importance of  compliance   Assessed frequency of self monitored BP, reviewed home and provider office readings, reviewed treatment targets and reviewed lifestyle strategies   Reviewed addition of Farxiga to medication regimen for blood pressure, blood sugar and weight control in addition to slowing progression of CKD and lowering risks of heart attack, stroke and cardiac death , assessed tolerance of Farxiga and reviewed major adverse side effects   Reviewed scheduled/upcoming provider appointments  Discussed plans with patient for ongoing care management follow up and provided patient with direct contact information for care management  team    Diabetes:  (Status: Goal on track: YES.)- patient reports self monitored fasting CBGs are usually <130, checks 1-2 times a day.  Most recent A1C=6.1 on 10/17/21. Lab Results  Component Value Date   HGBA1C 7.0 (H) 05/15/2021  Assessed frequency of CBG testing and reviewed fasting target Reviewed medications, including recent addition of Farxiga to medication regimen with patient and discussed importance of good medication taking behavior Discussed plans with patient for ongoing care management follow up and provided patient with direct contact information for care management team;               Health Maintenance (Status: Condition stable. Not addressed this visit.) - patient completed colonoscopy and endoscopy on 02/26/21 and was aware of pathology report- non cancerous Patient interviewed about adult health maintenance status   Pneumonia Vaccine-  received pneumococcal  vaccine on 05/15/21 Influenza Vaccine- states he received flu shot on 02/06/21 COVID vaccination   - pt states he never received  Shingles Vaccination- received first dose on 02/06/21, did not receive 2nd vaccination so will message provider prior to his appointment on May 11,2023-second shingles vaccine received 10/17/21 Regular eye checkups- says he sees his eye doctor, Dr [name],  every 6 months due to  glaucoma and his next appointment is in November  Advanced Directives- states he does not have and does not want information at this time  Dental Care- states his dentist is Dr Mina Marble in [place] and he sees her once-twice yearly but he is currently overdue for check up and cleaning, emphasized importance of regular dental care especially with his diagnosis of DM      Hyperlipidemia:  (Status: Condition stable. Not addressed this visit. Lab Results  Component Value Date   CHOL 162 05/15/2021   HDL 48 05/15/2021   LDLCALC 88 05/15/2021   TRIG 147 05/15/2021   CHOLHDL 6.1 (H) 12/20/2019     Medication review performed; medication list updated in electronic medical record.   Hypertension: (Status: Goal on Track (progressing): YES.)-patient states he checks his BP occasionally and that readings are usually <130/<80, he says provider office readings are higher at times because he is in pain from the walk to the offices. 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 Last practice recorded BP readings:  BP Readings from Last 3 Encounters:  08/01/21 107/76  06/26/21 110/76  06/19/21 116/76    Most recent eGFR/CrCl:  Lab Results  Component Value Date   NA 137 05/15/2021   K 4.1 05/15/2021   CREATININE 1.63 (H) 05/15/2021   EGFR 51 (L) 05/15/2021   GFRNONAA 37 (L) 01/10/2020   GLUCOSE 128 (H) 05/15/2021   Evaluation of current treatment plan related to hypertension self management and patient's adherence to plan as established by provider;   Reviewed BP medications and assessed medication taking behavior ensuring he is taking Losartan that was recently added to his regimen Assessed frequency of self monitored blood press and readings, reviewed treatment targets Discussed plans with patient for ongoing care management follow up and provided patient with direct contact information for care management team; Reviewed scheduled/upcoming provider  appointments  Pain:  (Status: Goal on track: NO.)patient states bilateral hip pain continues and requires regular injections and oxycodone to control, says he must find a new pain management provider as Dr. [name] no longer accepts [facility]. Medications reviewed Reviewed provider established plan for pain management; Counseled on the importance of reporting any/all new  or changed pain symptoms or management strategies to pain management provider; Positive reinforcement given to patient regarding ongoing weight loss to meet goal to qualify for bilateral hip surgery  Weight Loss Interventions:  (Status:  New goal.) Long Term Goal- pt and gastric bypass in 2010 in [place], currently meeting with RD, [facility] [name] to assist with meeting goal weight of 255 lbs or less so that he may proceed with bilateral hip surgery, patient says he only drinks water but struggles with concentrated sweet consumption although the Ozempic has also helped with his CHO cravings, unfortunately patient canceled follow up appointment with dietician on 08/19/21 and has not rescheduled. Provided verbal and/or written education to patient re: provider recommended life style modifications  Reviewed recommended dietary changes: avoid fad diets, make small/incremental dietary and exercise changes, eat at the table and avoid eating in front of the TV, plan management of cravings, monitor snacking and cravings in food diary Congratulated patient on good medication taking behavior, ongoing weight loss  and 30-40 minutes of Nu Step home exercise each evening Reviewed [place] benefit related to assisting with weight loss Encouraged patient to reschedule with [name] for medical nutrition therapy  Patient Goals/Self-Care Activities: Take medications as prescribed   Attend all scheduled provider appointments Call pharmacy for medication refills 3-7 days in advance of running out of medications Perform  all self care activities independently  Perform IADL's (shopping, preparing meals, housekeeping, managing finances) independently Call provider office for new concerns or questions  Call and schedule dentist appointment with [name] for check up and cleaning as soon as possible , phone number [phone] Reschedule a follow up appointment with [name], dietician and [place], to help with ongoing weight loss   Long-Range Goal: Establish Plan of Care for Chronic Disease Management Needs   Priority: High  Note:   Timeframe:  Long-Range Goal Priority:  High Start Date:     10/21/21                        Expected End Date:  ongoing                     Follow Up Date: 02/09/22   - schedule appointment for vaccines needed due to my age or health - schedule recommended health tests (blood work, mammogram, colonoscopy, pap test) - schedule and keep appointment for annual check-up    Why is this important?   Screening tests can find diseases early when they are easier to treat.  Your doctor or nurse will talk with you about which tests are important for you.  Getting shots for common diseases like the flu and shingles will help prevent them.  12/30/21:  Patient currently undergoing PT S/P left hip replacement   Follow Up:  Patient agrees to Care Plan and Follow-up.  Plan: The Managed Medicaid care management team will reach out to the patient again over the next 30 business days. and The  Patient has been provided with contact information for the Managed Medicaid care management team and has been advised to call with any health related questions or concerns.  Date/time of next scheduled RN care management/care coordination outreach:  02/09/22 at 0900

## 2021-12-31 ENCOUNTER — Ambulatory Visit (HOSPITAL_COMMUNITY): Payer: Medicaid Other

## 2021-12-31 ENCOUNTER — Encounter (HOSPITAL_COMMUNITY): Payer: Self-pay

## 2021-12-31 ENCOUNTER — Encounter (INDEPENDENT_AMBULATORY_CARE_PROVIDER_SITE_OTHER): Payer: Self-pay

## 2021-12-31 DIAGNOSIS — R2689 Other abnormalities of gait and mobility: Secondary | ICD-10-CM | POA: Diagnosis not present

## 2021-12-31 DIAGNOSIS — M1612 Unilateral primary osteoarthritis, left hip: Secondary | ICD-10-CM | POA: Diagnosis not present

## 2021-12-31 DIAGNOSIS — R29898 Other symptoms and signs involving the musculoskeletal system: Secondary | ICD-10-CM

## 2021-12-31 DIAGNOSIS — M25552 Pain in left hip: Secondary | ICD-10-CM

## 2021-12-31 DIAGNOSIS — M6281 Muscle weakness (generalized): Secondary | ICD-10-CM | POA: Diagnosis not present

## 2021-12-31 DIAGNOSIS — Z96642 Presence of left artificial hip joint: Secondary | ICD-10-CM | POA: Diagnosis not present

## 2021-12-31 NOTE — Therapy (Signed)
OUTPATIENT PHYSICAL THERAPY TREATMENT   Patient Name: [name] MRN: [id number] DOB:04/21/1971, 51 y.o., male Today's Date: 12/31/2021   PT End of Session - 12/31/21 1534     Visit Number 5    Number of Visits 12    Date for PT Re-Evaluation 01/29/22    Authorization Type Medicaid [employer]    Authorization Time Period 8 visits approved 12/18/21 -02/15/22 - will need to request more if needed    Authorization - Visit Number 4    Authorization - Number of Visits 8    PT Start Time 9381    PT Stop Time 1430    PT Time Calculation (min) 43 min    Activity Tolerance Patient tolerated treatment well    Behavior During Therapy [facility] for tasks assessed/performed              Past Medical History:  Diagnosis Date   ALLERGY 08/03/2008   Qualifier: Diagnosis of  By: Christ Kick     Anemia    Arthritis    "hips" (01/15/2015)   Blood transfusion without reported diagnosis    Cellulitis 07/25/2018   Chronic back pain greater than 3 months duration 02/11/2017   Chronic lower back pain    Colon polyps    COLONIC POLYPS, ADENOMATOUS 08/03/2008   Qualifier: Diagnosis of  By: Christ Kick     CVA 08/03/2008   Qualifier: History of  By: Christ Kick     Depression    denies   Diabetes mellitus    diet controlled- no meds since wt loss   Diabetes mellitus ([place]) 07/10/2019   Dysrhythmia    s/p surgery bariatric- cardioversion   Erectile dysfunction 01/26/2018   GERD (gastroesophageal reflux disease)    HIATAL HERNIA 08/03/2008   Qualifier: Diagnosis of  By: Christ Kick     Hip pain    History of gout    Hyperlipidemia    Hypertension    Joint pain    Narcotic dependence ([place]) 02/11/2017   Neuromuscular disorder ([place])    PONV (postoperative nausea and vomiting)    Prediabetes 03/29/2017   Sleep apnea    no longer have to use cpap since wt loss   Stage 3 chronic kidney disease ([place]) 01/26/2018   Stroke ([place]) 2006   denies residual on 01/15/2015    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 ([place]) 08/01/2018   Past Surgical History:  Procedure Laterality Date   BARIATRIC SURGERY  2010   in [place]  02/26/2021   Procedure: BIOPSY;  Surgeon: Harvel Quale, MD;  [facility];  Service: Gastroenterology;;   CARDIOVERSION     COLONOSCOPY N/A 08/02/2014   Procedure: COLONOSCOPY;  Surgeon: Rogene [place], MD;  [facility];  Service: Endoscopy;  Laterality: N/A;  210 - moved to 3/10 @ 11:55 - Ann notified pt   COLONOSCOPY WITH PROPOFOL N/A 02/26/2021   Procedure: COLONOSCOPY WITH PROPOFOL;  Surgeon: Harvel Quale, MD;  [facility];  Service: Gastroenterology;  Laterality: N/A;  10:00   ESOPHAGOGASTRODUODENOSCOPY     ESOPHAGOGASTRODUODENOSCOPY (EGD) WITH PROPOFOL N/A 02/26/2021   Procedure: ESOPHAGOGASTRODUODENOSCOPY (EGD) WITH PROPOFOL;  Surgeon: Harvel Quale, MD;  [facility];  Service: Gastroenterology;  Laterality: N/A;   HERNIA REPAIR     LIPOSUCTION     PANNICULECTOMY  01/14/2015   PANNICULECTOMY N/A 01/14/2015   Procedure:  TOTAL PANNICULECTOMY WTH LIPOSUCTION OF SIDES;  Surgeon: Berneta Sages  Towanda Malkin, MD;  Location: [place];  Service: Plastics;  Laterality: N/A;   POLYPECTOMY  02/26/2021   Procedure: POLYPECTOMY INTESTINAL;  Surgeon: Harvel Quale, MD;  [facility];  Service: Gastroenterology;;   TONSILLECTOMY     TOTAL HIP ARTHROPLASTY Left 12/16/2021   Procedure: TOTAL HIP ARTHROPLASTY ANTERIOR APPROACH;  Surgeon: Renette Butters, MD;  Location: [facility];  Service: Orthopedics;  Laterality: Left;   VENTRAL HERNIA REPAIR  01/14/2015   VENTRAL HERNIA REPAIR N/A 01/14/2015   Procedure: HERNIA REPAIR VENTRAL ADULT AND MUSCLE REPAIR;  Surgeon: Cristine Polio, MD;  Location: [place];  Service: Plastics;  Laterality: N/A;   Patient Active Problem List   Diagnosis Date Noted   S/P total left hip arthroplasty 12/16/2021   Need for  varicella vaccine 10/16/2021   OSA  09/11/2021   Pre-operative clearance 06/19/2021   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   Immunization due 02/06/2021   Iron deficiency anemia 12/30/2020   History of colonic polyps 12/30/2020   Unilateral primary osteoarthritis, right hip 08/13/2020   Unilateral primary osteoarthritis, left hip 08/13/2020   Muscle spasms of both lower extremities 05/23/2020   Need for immunization against influenza 03/06/2020   Primary osteoarthritis of both hips 07/10/2019   Bilateral primary osteoarthritis of hip 07/10/2019   Vitamin D deficiency 04/13/2019   Morbid obesity with body mass index (BMI) of 40.0 to 44.9 in adult ([place]) 04/13/2019   Depressive disorder 04/13/2019   Body mass index (BMI) 45.0-49.9, adult ([place]) 04/13/2019   Kidney disease, chronic, stage III (GFR 30-59 ml/min) ([place]) 07/20/2017   History of cerebrovascular accident 07/20/2017   Gastroesophageal reflux disease 07/20/2017   Gastric bypass status for obesity 02/11/2017   Benzodiazepine dependence ([place]) 02/11/2017   Type 2 diabetes mellitus with diabetic nephropathy ([place]) 08/03/2008   Hyperlipidemia 08/03/2008   High blood pressure 08/03/2008   SLEEP APNEA 08/03/2008   Sleep apnea 08/03/2008    PCP: Vena Rua FNP  REFERRING PROVIDER: Renette Butters, MD   REFERRING DIAG: post op lt THR per [place], MD DOS 12/16/21   THERAPY DIAG:  Pain in left hip  Muscle weakness (generalized)  Other abnormalities of gait and mobility  Other symptoms and signs involving the musculoskeletal system  Rationale for Evaluation and Treatment Rehabilitation  ONSET DATE: 12/16/21  SUBJECTIVE:   SUBJECTIVE STATEMENT: Pt reports increased pain Rt hip compared to Lt.  Has apt with MD Percell Miller later today, hoping for shot in Rt hip.  Eval subjective: Patient states hip is just sore. Burning in his thigh muscles. He was using walker and cane  and rollator primarily before surgery.   PERTINENT HISTORY: obesity, depression, advanced hip OA, history of morbid obesity (was 600 pounds), history of low back pain  PAIN:   Are you having pain? Yes: NPRS scale: 2-3/10 Pain location: Rt hip > L hip Pain description: sore Aggravating factors: standing Relieving factors: movement  PRECAUTIONS: None  WEIGHT BEARING RESTRICTIONS No  FALLS:  Has patient fallen in last 6 months? No  LIVING ENVIRONMENT: Lives with: lives alone Lives in: House/apartment Stairs: No Has following equipment at home: Single point cane, Environmental consultant - 2 wheeled, and Environmental consultant - 4 wheeled  OCCUPATION: Disability  PLOF: Independent  PATIENT GOALS get the leg better and improve walking   OBJECTIVE: (objective measures from initial evaluation unless otherwise dated) PATIENT SURVEYS:  LEFS 20/80  COGNITION:  Overall cognitive status: Within functional limits for tasks  assessed     SENSATION: WFL  POSTURE: rounded shoulders and forward head  PALPATION: TTP grossly throughout L hip/quads  LOWER EXTREMITY ROM:  Active ROM Right eval Left eval  Hip flexion    Hip extension    Hip abduction    Hip adduction    Hip internal rotation    Hip external rotation    Knee flexion    Knee extension    Ankle dorsiflexion    Ankle plantarflexion    Ankle inversion    Ankle eversion     (Blank rows = not tested)  LOWER EXTREMITY MMT:  MMT Right eval Left eval  Hip flexion 4- 3-  Hip extension    Hip abduction    Hip adduction    Hip internal rotation    Hip external rotation    Knee flexion 5 5  Knee extension 5 5  Ankle dorsiflexion 5 5  Ankle plantarflexion    Ankle inversion    Ankle eversion     (Blank rows = not tested)    FUNCTIONAL TESTS:  2 minute walk test: 150 feet with RW Transfers: labored with UE support Stair: 4 inch stairs with alternating pattern, requires bilateral UE support 12/31/21: 5 STS '2\' 53"'$  on black foam  2MWT  24f with RW  GAIT: Distance walked: 150 Assistive device utilized: WEnvironmental consultant- 2 wheeled Level of assistance: Modified independence Comments: 2MWT, limited L knee flexion/extension throughout, antalgic    TODAY'S TREATMENT: 12/31/21: Standing: 3D hip excursion (weight shifting, rotation, squat front of chair with HHA, extension) 10x each Rockerboard 252m lateral with HHA 5STS elevated height 2'53" no HHA (black foam) 2MWT 20443fith RW Toe tapping 6in step height, required 1 HHA Sidestep 2RT inside // bars  12/29/21  Warm up and consitent AROM for hip using Nustep level 2 five mins, seat level 12  There-ex = in // bars    - Standing Heel raises B LE x 20    - Standing toe raises B LE x 20    - Standing hip abduction B LE each 2 sets x 10 reps   - Standing hip flexion marching knee up x 10 reps standing on R LE   - Mini squats 3 x 10 reps with cue for increased left weight shift onto L LE, B UE hands mod use    - Standing hip extension B LE each 2 sets x 10 reps    - Standing black foam normal BOS x 60 sec, narrow BOS x 30 sec with cue for equal WB, shift to left as able  There-ex = sitting    - hip adduction iso verse blue gym ball x 25 reps   - LAQ 10 x 2-3 sec hold L LE with 2lb weight cue for distal quad activation hold     12/24/21 Bridge 2x 10 Hooklying clamshell GTB 2x 10  Bent knee fall outs 2x 10 LAQ 10 x 5 second holds LLE Supine heel slides 2 x 10 LLE Seated hip hip adduction isometric 10 x 10 seconds Standing hip abduction 2 x 10 bilateral  Standing hip extension 2x 10 bilateral  Seated march 2x 10 bilateral  Standing hamstring curls 2 x 10 bilateral  Mini squat 3x 10    12/22/21 Bridge 2x 10  Supine heel slides 2 x 10 with belt Hooklying hip abduction isometric 10 x 10 second holds Sideling clam 2x 10 LLE LAQ 10 x 5 second holds LLE HR 2x 10  Standing hip abduction 2 x 10 bilateral  Standing hip extension 2x 10 bilateral  Seated hip adduction isometric 10  x 10 second holds Seated march 2x 10 bilateral  Standing hamstring curls 2 x 10 bilateral  Mini squat 2 x 10    12/18/21 Ankle pumps x 20 Glute sets 10x 10  second holds Bridge 2x 10 Heel slides with belt 1  x 10    PATIENT EDUCATION:  Education details: 12/24/21 exercise mechanics, rest breaks when needed 12/22/21 HEP;  Eval: Patient educated on exam findings, POC, scope of PT, HEP, and exercise mechanics. Person educated: Patient Education method: Explanation, Demonstration, and Handouts Education comprehension: verbalized understanding, returned demonstration, verbal cues required, and tactile cues required   HOME EXERCISE PROGRAM: Access Code: [email or web address] 12/31/21:  12/22/21 - Standing Hip Abduction with Counter Support  - 3 x daily - 7 x weekly - 2 sets - 10 reps - Mini Squat with Counter Support  - 3 x daily - 7 x weekly - 2 sets - 10 reps  Date: 12/18/2021 - Supine Ankle Pumps  - 3 x daily - 7 x weekly - 20 reps - Supine Gluteal Sets  - 3 x daily - 7 x weekly - 10 reps - 10 second hold - Supine Heel Slide with Strap  - 3 x daily - 7 x weekly - 2 sets - 10 reps - Seated Long Arc Quad (Mirrored)  - 3 x daily - 7 x weekly - 1 sets - 10 reps - 5 second hold   ASSESSMENT:  CLINICAL IMPRESSION: Added weight shifting/rockerboard to equalize weight distribution for pain control.  Reviewed goals prior MD apt later today, pt hopes to receive shot in Rt hip for pain control.  Pt reports improvements by 6%.  Pt presents with increased cadence safely ambulating with RW 2MWT.  Added 5STS, required elevated height and cueing for mechanics to complete without HHA.  No reports of increased pain at EOS.  Added 3D hip excursion to HEP for hip mobility, weight shifting and gluteal strengthening.    OBJECTIVE IMPAIRMENTS Abnormal gait, decreased activity tolerance, decreased balance, decreased endurance, decreased mobility, difficulty walking, decreased ROM, decreased strength, increased edema,  increased muscle spasms, impaired flexibility, improper body mechanics, and pain.   ACTIVITY LIMITATIONS carrying, lifting, bending, standing, squatting, sleeping, stairs, transfers, bathing, dressing, hygiene/grooming, locomotion level, and caring for others  PARTICIPATION LIMITATIONS: meal prep, cleaning, laundry, shopping, community activity, and yard work  PERSONAL FACTORS Time since onset of injury/illness/exacerbation and 3+ comorbidities: obesity, depression, advanced hip OA, history of morbid obesity (was 600 pounds), history of low back pain  are also affecting patient's functional outcome.   REHAB POTENTIAL: Good  CLINICAL DECISION MAKING: Stable/uncomplicated  EVALUATION COMPLEXITY: Low   GOALS: Goals reviewed with patient? Yes  SHORT TERM GOALS: Target date: 01/08/2022  Patient will be independent with HEP in order to improve functional outcomes. Baseline: 12/31/21:  Reports compliance with HEP daily.   Goal status: IN PROGRESS  2.  Patient will report at least 25% improvement in symptoms for improved quality of life. Baseline: 12/31/21:  Reports improvements by 6% Goal status: IN PROGRESS   LONG TERM GOALS: Target date: 01/29/2022  Patient will report at least 75% improvement in symptoms for improved quality of life. Baseline:  Goal status: IN PROGRESS  2.  Patient will improve LEFS core by at least 18 points in order to indicate improved tolerance to activity. Baseline: 20/80 Goal status: IN PROGRESS  3.  Patient  will be able to navigate standard stairs with reciprocal pattern without compensation on LLE in order to demonstrate improved LE strength. Baseline: 4 inch steps with UE support; 12/31/21: stairs being used, unable to assess this session. Goal status: IN PROGRESS  4.  Patient will be able to ambulate at least 350 feet with LRAD in 2MWT in order to demonstrate improved tolerance to activity. Baseline: Eval: 150 with RW; 12/31/21: 2MWT 274f with RW Goal status:  IN PROGRESS  5.  Patient will demonstrate grade of 5/5 MMT grade in all tested musculature in LLE as evidence of improved strength to assist with stair ambulation and gait.   Baseline: see MMT Goal status: IN PROGRESS    PLAN: PT FREQUENCY: 2x/week  PT DURATION: 6 weeks  PLANNED INTERVENTIONS: Therapeutic exercises, Therapeutic activity, Neuromuscular re-education, Balance training, Gait training, Patient/Family education, Joint manipulation, Joint mobilization, Stair training, Orthotic/Fit training, DME instructions, Aquatic Therapy, Dry Needling, Electrical stimulation, Spinal manipulation, Spinal mobilization, Cryotherapy, Moist heat, Compression bandaging, scar mobilization, Splintting, Taping, Traction, Ultrasound, Ionotophoresis '4mg'$ /ml Dexamethasone, and Manual therapy   PLAN FOR NEXT SESSION: f/u with MD apt on 12/31/21, left hip mobility and strength and progress as tolerated  CIhor Austin LPTA/CLT; CBIS [phone] CAldona Lento PTA 12/31/2021, 3:36 PM

## 2022-01-01 ENCOUNTER — Encounter: Payer: Medicaid Other | Admitting: Nurse Practitioner

## 2022-01-01 ENCOUNTER — Telehealth: Payer: Self-pay | Admitting: Nurse Practitioner

## 2022-01-01 ENCOUNTER — Telehealth: Payer: Self-pay | Admitting: *Deleted

## 2022-01-01 NOTE — Telephone Encounter (Signed)
Surgery Clearance Forms  Noted  Copied Sleeved   Original in providers box  Copy in brown folder

## 2022-01-01 NOTE — Telephone Encounter (Signed)
   Pre-operative Risk Assessment    Patient Name: [name]  DOB: [birth year] MRN: [id number]      Request for Surgical Clearance    Procedure:   TOTAL RIGHT HIP REPLACEMENT  Date of Surgery:  Clearance TBD                                 Surgeon:  Edmonia Lynch, MD Surgeon's Group or Practice Name:  [facility] Phone number:  [phone] Fax number:  [phone]  ATTN:  KELLY HIGH   Type of Clearance Requested:   - Medical    Type of Anesthesia:  Spinal   Additional requests/questions:    Astrid Divine   01/01/2022, 1:58 PM

## 2022-01-01 NOTE — Telephone Encounter (Signed)
   Patient Name: [name]  DOB: 02-Dec-1970 MRN: [id number]  Primary Cardiologist: Carlyle Dolly, MD  Chart reviewed as part of pre-operative protocol coverage. Patient was just recently seen 11/05/21 by Dr. Harl Bowie and cleared for hip surgery. He had the left hip operated on 11/2021 and states he did well. His clearance is still good per our protocol since it was completed in the last 2 months. I spoke with the patient to ensure no new changes. The patient affirms he has been doing well without any new cardiac symptoms. Therefore, OK to proceed acceptable risk for the planned procedure without further cardiovascular testing. The patient was advised that if he develops new symptoms prior to surgery to contact our office to arrange for a follow-up visit, and he verbalized understanding.  Patient has Xarelto listed on med list from prior surgery's DVT prophylaxis prescription so will defer to surgical team to advise patient on this. Of note in 10/2021 Dr. Harl Bowie recommended he start a baby aspirin. Patient reports he was asked to stop due to the hip surgeries. We would recommend to resume aspirin post-operatively when felt safe by requesting surgical team.  Will route this bundled recommendation to requesting provider via Epic fax function. Please call with questions.  Charlie Pitter, PA-C 01/01/2022, 5:22 PM

## 2022-01-02 ENCOUNTER — Encounter: Payer: Self-pay | Admitting: Family Medicine

## 2022-01-02 ENCOUNTER — Ambulatory Visit: Payer: Medicaid Other | Admitting: Family Medicine

## 2022-01-02 ENCOUNTER — Other Ambulatory Visit: Payer: Self-pay | Admitting: Nurse Practitioner

## 2022-01-02 VITALS — BP 137/85 | HR 94 | Ht 69.0 in | Wt 261.1 lb

## 2022-01-02 DIAGNOSIS — Z96642 Presence of left artificial hip joint: Secondary | ICD-10-CM

## 2022-01-02 DIAGNOSIS — M1611 Unilateral primary osteoarthritis, right hip: Secondary | ICD-10-CM

## 2022-01-02 NOTE — Progress Notes (Unsigned)
Established Patient Office Visit  Subjective:  Patient ID: [name], male    DOB: [birth year]  Age: 51 y.o. MRN: [id number]  CC:  Chief Complaint  Patient presents with   Follow-up    Pt following up from hospital f/u 12/16/2021 d/c on 12/17/2021, needs forms to be completed for surgery clearance, states forms were faxed in.     HPI [name] is a 51 y.o. male with past medical history of high blood pressure, sleep apnea, OSA, GERD presents for Hospital follow up and surgery clearance. Total left hip Arthroplasty: pt had a total left hip arthroplasty on 12/18/21   chronic medical conditions.  Right hip is still botherting hip  Using caines Trying to twalk as far as he cann  Took pain paill  oxy 20 mg   Back pain:    Tstill taking robaixn Taking nighttime  Past Medical History:  Diagnosis Date   ALLERGY 08/03/2008   Qualifier: Diagnosis of  By: Christ Kick     Anemia    Arthritis    "hips" (01/15/2015)   Blood transfusion without reported diagnosis    Cellulitis 07/25/2018   Chronic back pain greater than 3 months duration 02/11/2017   Chronic lower back pain    Colon polyps    COLONIC POLYPS, ADENOMATOUS 08/03/2008   Qualifier: Diagnosis of  By: Christ Kick     CVA 08/03/2008   Qualifier: History of  By: Christ Kick     Depression    denies   Diabetes mellitus    diet controlled- no meds since wt loss   Diabetes mellitus ([place]) 07/10/2019   Dysrhythmia    s/p surgery bariatric- cardioversion   Erectile dysfunction 01/26/2018   GERD (gastroesophageal reflux disease)    HIATAL HERNIA 08/03/2008   Qualifier: Diagnosis of  By: Christ Kick     Hip pain    History of gout    Hyperlipidemia    Hypertension    Joint pain    Narcotic dependence ([place]) 02/11/2017   Neuromuscular disorder ([place])    PONV (postoperative nausea and vomiting)    Prediabetes 03/29/2017   Sleep apnea    no longer have to use cpap since wt loss   Stage 3  chronic kidney disease ([place]) 01/26/2018   Stroke ([place]) 2006   denies residual on 01/15/2015   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 ([place]) 08/01/2018    Past Surgical History:  Procedure Laterality Date   BARIATRIC SURGERY  2010   in [place]  02/26/2021   Procedure: BIOPSY;  Surgeon: Harvel Quale, MD;  [facility];  Service: Gastroenterology;;   CARDIOVERSION     COLONOSCOPY N/A 08/02/2014   Procedure: COLONOSCOPY;  Surgeon: Rogene Houston, MD;  [facility];  Service: Endoscopy;  Laterality: N/A;  210 - moved to 3/10 @ 11:55 - Ann notified pt   COLONOSCOPY WITH PROPOFOL N/A 02/26/2021   Procedure: COLONOSCOPY WITH PROPOFOL;  Surgeon: Harvel Quale, MD;  [facility];  Service: Gastroenterology;  Laterality: N/A;  10:00   ESOPHAGOGASTRODUODENOSCOPY     ESOPHAGOGASTRODUODENOSCOPY (EGD) WITH PROPOFOL N/A 02/26/2021   Procedure: ESOPHAGOGASTRODUODENOSCOPY (EGD) WITH PROPOFOL;  Surgeon: Harvel Quale, MD;  [facility];  Service: Gastroenterology;  Laterality: N/A;   HERNIA REPAIR     LIPOSUCTION     PANNICULECTOMY  01/14/2015   PANNICULECTOMY N/A 01/14/2015   Procedure:  TOTAL PANNICULECTOMY WTH LIPOSUCTION OF  SIDES;  Surgeon: Cristine Polio, MD;  Location: [facility];  Service: Plastics;  Laterality: N/A;   POLYPECTOMY  02/26/2021   Procedure: POLYPECTOMY INTESTINAL;  Surgeon: Harvel Quale, MD;  [facility];  Service: Gastroenterology;;   TONSILLECTOMY     TOTAL HIP ARTHROPLASTY Left 12/16/2021   Procedure: TOTAL HIP ARTHROPLASTY ANTERIOR APPROACH;  Surgeon: Renette Butters, MD;  Location: [facility];  Service: Orthopedics;  Laterality: Left;   VENTRAL HERNIA REPAIR  01/14/2015   VENTRAL HERNIA REPAIR N/A 01/14/2015   Procedure: HERNIA REPAIR VENTRAL ADULT AND MUSCLE REPAIR;  Surgeon: Cristine Polio, MD;  Location: [facility];  Service: Plastics;  Laterality: N/A;    Family  History  Problem Relation Age of Onset   COPD Mother    Depression Mother    Hyperlipidemia Mother    Hypertension Mother    Heart disease Mother    Thyroid disease Mother    Anxiety disorder Mother    Diabetes Father    Pulmonary embolism Father 50       blood clot   Hyperlipidemia Father    Sudden death Father    Obesity Father    Eating disorder Father    Hypertension Brother     Social History   Socioeconomic History   Marital status: Single    Spouse name: Not on file   Number of children: 0   Years of education: 12   Highest education level: Not on file  Occupational History   Not on file  Tobacco Use   Smoking status: Never   Smokeless tobacco: Never  Vaping Use   Vaping Use: Never used  Substance and Sexual Activity   Alcohol use: No   Drug use: No   Sexual activity: Yes    Birth control/protection: Condom  Other Topics Concern   Not on file  Social History Narrative   Lives alone   Cat-Mr [place]      Enjoy races, eat out, watch movies, listen music, reads some      Diet: Snacks a lot, tried to avoid red meat, does eat chicken and [place], eats a lot of carbs and breads (chips), and sweets   Caffeine: drinks a can monster daily   Water: 3-4 bottles 16.9 oz      Wear seat belt   Wear sunscreen   Smoke and carbon monoxide detectors    Reports not using phone when driving   Social Determinants of Health   Financial Resource Strain: Low Risk  (12/30/2021)   Overall Financial Resource Strain (CARDIA)    Difficulty of Paying Living Expenses: Not hard at all  Food Insecurity: No Food Insecurity (11/21/2021)   Hunger Vital Sign    Worried About Running Out of Food in the Last Year: Never true    [place] in the Last Year: Never true  Transportation Needs: No Transportation Needs (01/07/2021)   PRAPARE - Hydrologist (Medical): No    Lack of Transportation (Non-Medical): No  Physical Activity: Sufficiently Active  (01/07/2021)   Exercise Vital Sign    Days of Exercise per Week: 7 days    Minutes of Exercise per Session: 40 min  Stress: No Stress Concern Present (01/07/2021)   [place]    Feeling of Stress : Only a little  Social Connections: Unknown (01/07/2021)   Social Connection and Isolation Panel [NHANES]    Frequency of Communication with Friends  and Family: Not on file    Frequency of Social Gatherings with Friends and Family: Not on file    Attends Religious Services: Never    Active Member of Clubs or Organizations: No    Attends Archivist Meetings: Never    Marital Status: Not on file  Intimate Partner Violence: Not At Risk (12/30/2021)   Humiliation, Afraid, Rape, and Kick questionnaire    Fear of Current or Ex-Partner: No    Emotionally Abused: No    Physically Abused: No    Sexually Abused: No    Outpatient Medications Prior to Visit  Medication Sig Dispense Refill   acetaminophen (TYLENOL) 500 MG tablet Take 2 tablets (1,000 mg total) by mouth every 6 (six) hours as needed for mild pain or moderate pain. 60 tablet 0   atorvastatin (LIPITOR) 40 MG tablet Take 1 tablet (40 mg total) by mouth daily. 90 tablet 3   blood glucose meter kit and supplies KIT Test daily Dx  r73.03 1 each 0   Blood Pressure Monitor KIT 1 each by Does not apply route daily. 1 kit 0   FARXIGA 5 MG TABS tablet Take 5 mg by mouth every morning.     ferrous sulfate 325 (65 FE) MG tablet Take 65 mg by mouth every 3 (three) days.     gabapentin (NEURONTIN) 300 MG capsule Take 1 capsule (300 mg total) by mouth at bedtime. 90 capsule 0   glucose blood test strip Use as instructed. Can check up to 4 times daily. 100 each 3   KERENDIA 10 MG TABS Take 10 mg by mouth daily.     latanoprost (XALATAN) 0.005 % ophthalmic solution Place 1 drop into both eyes at bedtime.     losartan (COZAAR) 25 MG tablet Take 1 tablet (25 mg total) by mouth daily.  90 tablet 0   methocarbamol (ROBAXIN-750) 750 MG tablet Take 1 tablet (750 mg total) by mouth every 8 (eight) hours as needed for muscle spasms. 20 tablet 0   Multiple Vitamin (MULTIVITAMIN) tablet Take 1 tablet by mouth daily. One a day     NARCAN 4 MG/0.1ML LIQD nasal spray kit Place 0.4 mg into the nose once.     ondansetron (ZOFRAN-ODT) 4 MG disintegrating tablet Take 1 tablet (4 mg total) by mouth 2 (two) times daily as needed for nausea or vomiting. 10 tablet 0   Oxycodone HCl 10 MG TABS Take 0.5-1 tablets (5-10 mg total) by mouth every 8 (eight) hours as needed (for severe pain after surgery not solved with your normal daily Oxycodone dose). 21 tablet 0   Oxycodone HCl 20 MG TABS Take 20 mg by mouth 4 (four) times daily as needed (pain).     polyethylene glycol (MIRALAX) 17 g packet Take 17 g by mouth daily. to prevent constipation 14 each 0   rivaroxaban (XARELTO) 10 MG TABS tablet Take 1 tablet (10 mg total) by mouth daily. to prevent blood clots after surgery. YOU MUST TAKE THIS MEDICINE! 30 tablet 0   Semaglutide, 2 MG/DOSE, 8 MG/3ML SOPN Inject 2 mg as directed once a week. (Patient taking differently: Inject 2 mg as directed every Wednesday.) 3 mL 11   sildenafil (VIAGRA) 50 MG tablet Take 1 tablet (50 mg total) by mouth daily as needed for erectile dysfunction. 20 tablet 0   timolol (BETIMOL) 0.5 % ophthalmic solution Place 1 drop into both eyes in the morning.     vitamin B-12 (CYANOCOBALAMIN) 1000 MCG tablet Take  1,000 mcg by mouth daily.     No facility-administered medications prior to visit.    No Known Allergies  ROS Review of Systems  Constitutional:  Negative for chills and fever.  Respiratory:  Negative for apnea and shortness of breath.   Cardiovascular:  Negative for chest pain and palpitations.  Gastrointestinal:  Negative for nausea and vomiting.  Genitourinary:  Negative for dysuria.  Musculoskeletal:  Positive for arthralgias and back pain (see [facility] pain  clinc for back pain.  chronice).  Neurological:  Negative for dizziness, weakness, numbness and headaches.  Psychiatric/Behavioral:  Negative for self-injury and suicidal ideas.       Objective:    Physical Exam  BP 137/85   Pulse 94   Ht '5\' 9"'  (1.753 m)   Wt 261 lb 1.9 oz (118.4 kg)   SpO2 94%   BMI 38.56 kg/m  Wt Readings from Last 3 Encounters:  01/02/22 261 lb 1.9 oz (118.4 kg)  12/16/21 262 lb 5.6 oz (119 kg)  12/04/21 263 lb (119.3 kg)    Lab Results  Component Value Date   TSH 1.81 04/13/2019   Lab Results  Component Value Date   WBC 6.8 12/04/2021   HGB 15.5 12/04/2021   HCT 45.1 12/04/2021   MCV 92.4 12/04/2021   PLT 178 12/04/2021   Lab Results  Component Value Date   NA 138 12/04/2021   K 3.9 12/04/2021   CO2 26 12/04/2021   GLUCOSE 125 (H) 12/04/2021   BUN 25 (H) 12/04/2021   CREATININE 1.62 (H) 12/04/2021   BILITOT 1.3 (H) 10/17/2021   ALKPHOS 113 10/17/2021   AST 22 10/17/2021   ALT 23 10/17/2021   PROT 7.2 10/17/2021   ALBUMIN 4.2 10/17/2021   CALCIUM 9.6 12/04/2021   ANIONGAP 7 12/04/2021   EGFR 48 (L) 10/17/2021   Lab Results  Component Value Date   CHOL 133 10/17/2021   Lab Results  Component Value Date   HDL 42 10/17/2021   Lab Results  Component Value Date   LDLCALC 62 10/17/2021   Lab Results  Component Value Date   TRIG 174 (H) 10/17/2021   Lab Results  Component Value Date   CHOLHDL 3.2 10/17/2021   Lab Results  Component Value Date   HGBA1C 6.1 (H) 10/17/2021      Assessment & Plan:   Problem List Items Addressed This Visit   None   No orders of the defined types were placed in this encounter.   Follow-up: No follow-ups on file.    Alvira Monday, FNP

## 2022-01-02 NOTE — Patient Instructions (Signed)
I appreciate the opportunity to provide care to you today!   Please follow up with the surgical team on when to stop taking xarelto and when you can resume baby aspirin.      Please continue to a heart-healthy diet and increase your physical activities. Try to exercise for 93mns at least three times a week.      It was a pleasure to see you and I look forward to continuing to work together on your health and well-being. Please do not hesitate to call the office if you need care or have questions about your care.   Have a wonderful day and week. With Gratitude, GAlvira MondayMSN, FNP-BC

## 2022-01-04 DIAGNOSIS — M5136 Other intervertebral disc degeneration, lumbar region: Secondary | ICD-10-CM | POA: Diagnosis not present

## 2022-01-04 NOTE — Assessment & Plan Note (Signed)
He requested clearance for his right total hip arthroplasty and reported being cleared by cardiology Defer xarelto perioperative management to the surgical team Reports taking oxycodone 20 mg for pain management RTHA clearance provided

## 2022-01-04 NOTE — Assessment & Plan Note (Signed)
The patient underwent a left THA by Dr. Percell Miller on 11/24/48 without complications He spent the night in observation for pain control and mobilization He passed his PT evaluation and was discharged home with OPPT He has voiced no complaints or concerns today since his surgery He reports doing well and following up with PT

## 2022-01-05 ENCOUNTER — Encounter (HOSPITAL_COMMUNITY): Payer: Self-pay | Admitting: Physical Therapy

## 2022-01-05 ENCOUNTER — Ambulatory Visit (HOSPITAL_COMMUNITY): Payer: Medicaid Other | Admitting: Physical Therapy

## 2022-01-05 DIAGNOSIS — R2689 Other abnormalities of gait and mobility: Secondary | ICD-10-CM | POA: Diagnosis not present

## 2022-01-05 DIAGNOSIS — M6281 Muscle weakness (generalized): Secondary | ICD-10-CM | POA: Diagnosis not present

## 2022-01-05 DIAGNOSIS — M25552 Pain in left hip: Secondary | ICD-10-CM

## 2022-01-05 DIAGNOSIS — R29898 Other symptoms and signs involving the musculoskeletal system: Secondary | ICD-10-CM | POA: Diagnosis not present

## 2022-01-05 DIAGNOSIS — Z96642 Presence of left artificial hip joint: Secondary | ICD-10-CM | POA: Diagnosis not present

## 2022-01-05 NOTE — Therapy (Signed)
OUTPATIENT PHYSICAL THERAPY TREATMENT   Patient Name: [name] MRN: [id number] DOB:1971-03-03, 51 y.o., male Today's Date: 01/05/2022   PT End of Session - 01/05/22 1300     Visit Number 6    Number of Visits 12    Date for PT Re-Evaluation 01/29/22    Authorization Type Medicaid [employer]    Authorization Time Period 8 visits approved 12/18/21 -02/15/22 - will need to request more if needed    Authorization - Visit Number 6    Authorization - Number of Visits 8    PT Start Time 1300    PT Stop Time 9937    PT Time Calculation (min) 38 min    Activity Tolerance Patient tolerated treatment well    Behavior During Therapy [facility] for tasks assessed/performed              Past Medical History:  Diagnosis Date   ALLERGY 08/03/2008   Qualifier: Diagnosis of  By: Christ Kick     Anemia    Arthritis    "hips" (01/15/2015)   Blood transfusion without reported diagnosis    Cellulitis 07/25/2018   Chronic back pain greater than 3 months duration 02/11/2017   Chronic lower back pain    Colon polyps    COLONIC POLYPS, ADENOMATOUS 08/03/2008   Qualifier: Diagnosis of  By: Christ Kick     CVA 08/03/2008   Qualifier: History of  By: Christ Kick     Depression    denies   Diabetes mellitus    diet controlled- no meds since wt loss   Diabetes mellitus ([place]) 07/10/2019   Dysrhythmia    s/p surgery bariatric- cardioversion   Erectile dysfunction 01/26/2018   GERD (gastroesophageal reflux disease)    HIATAL HERNIA 08/03/2008   Qualifier: Diagnosis of  By: Christ Kick     Hip pain    History of gout    Hyperlipidemia    Hypertension    Joint pain    Narcotic dependence ([place]) 02/11/2017   Neuromuscular disorder ([place])    PONV (postoperative nausea and vomiting)    Prediabetes 03/29/2017   Sleep apnea    no longer have to use cpap since wt loss   Stage 3 chronic kidney disease ([place]) 01/26/2018   Stroke ([place]) 2006   denies residual on 01/15/2015    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 ([place]) 08/01/2018   Past Surgical History:  Procedure Laterality Date   BARIATRIC SURGERY  2010   in [place]  02/26/2021   Procedure: BIOPSY;  Surgeon: Harvel Quale, MD;  [facility];  Service: Gastroenterology;;   CARDIOVERSION     COLONOSCOPY N/A 08/02/2014   Procedure: COLONOSCOPY;  Surgeon: Rogene Houston, MD;  [facility];  Service: Endoscopy;  Laterality: N/A;  210 - moved to 3/10 @ 11:55 - Ann notified pt   COLONOSCOPY WITH PROPOFOL N/A 02/26/2021   Procedure: COLONOSCOPY WITH PROPOFOL;  Surgeon: Harvel Quale, MD;  [facility];  Service: Gastroenterology;  Laterality: N/A;  10:00   ESOPHAGOGASTRODUODENOSCOPY     ESOPHAGOGASTRODUODENOSCOPY (EGD) WITH PROPOFOL N/A 02/26/2021   Procedure: ESOPHAGOGASTRODUODENOSCOPY (EGD) WITH PROPOFOL;  Surgeon: Harvel Quale, MD;  [facility];  Service: Gastroenterology;  Laterality: N/A;   HERNIA REPAIR     LIPOSUCTION     PANNICULECTOMY  01/14/2015   PANNICULECTOMY N/A 01/14/2015   Procedure:  TOTAL PANNICULECTOMY WTH LIPOSUCTION OF SIDES;  Surgeon: Berneta Sages  Towanda Malkin, MD;  Location: [facility];  Service: Plastics;  Laterality: N/A;   POLYPECTOMY  02/26/2021   Procedure: POLYPECTOMY INTESTINAL;  Surgeon: Harvel Quale, MD;  [facility];  Service: Gastroenterology;;   TONSILLECTOMY     TOTAL HIP ARTHROPLASTY Left 12/16/2021   Procedure: TOTAL HIP ARTHROPLASTY ANTERIOR APPROACH;  Surgeon: Renette Butters, MD;  Location: [facility];  Service: Orthopedics;  Laterality: Left;   VENTRAL HERNIA REPAIR  01/14/2015   VENTRAL HERNIA REPAIR N/A 01/14/2015   Procedure: HERNIA REPAIR VENTRAL ADULT AND MUSCLE REPAIR;  Surgeon: Cristine Polio, MD;  Location: [facility];  Service: Plastics;  Laterality: N/A;   Patient Active Problem List   Diagnosis Date Noted   S/P total left hip arthroplasty 12/16/2021   Need for  varicella vaccine 10/16/2021   OSA  09/11/2021   Pre-operative clearance 06/19/2021   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   Immunization due 02/06/2021   Iron deficiency anemia 12/30/2020   History of colonic polyps 12/30/2020   Unilateral primary osteoarthritis, right hip 08/13/2020   Unilateral primary osteoarthritis, left hip 08/13/2020   Muscle spasms of both lower extremities 05/23/2020   Need for immunization against influenza 03/06/2020   Primary osteoarthritis of both hips 07/10/2019   Bilateral primary osteoarthritis of hip 07/10/2019   Vitamin D deficiency 04/13/2019   Morbid obesity with body mass index (BMI) of 40.0 to 44.9 in adult ([place]) 04/13/2019   Depressive disorder 04/13/2019   Body mass index (BMI) 45.0-49.9, adult ([place]) 04/13/2019   Kidney disease, chronic, stage III (GFR 30-59 ml/min) ([place]) 07/20/2017   History of cerebrovascular accident 07/20/2017   Gastroesophageal reflux disease 07/20/2017   Gastric bypass status for obesity 02/11/2017   Benzodiazepine dependence ([place]) 02/11/2017   Type 2 diabetes mellitus with diabetic nephropathy ([place]) 08/03/2008   Hyperlipidemia 08/03/2008   High blood pressure 08/03/2008   SLEEP APNEA 08/03/2008   Sleep apnea 08/03/2008    PCP: Vena Rua FNP  REFERRING PROVIDER: Renette Butters, MD   REFERRING DIAG: post op lt THR per Fredonia Highland, MD DOS 12/16/21   THERAPY DIAG:  Pain in left hip  Muscle weakness (generalized)  Other abnormalities of gait and mobility  Other symptoms and signs involving the musculoskeletal system  Status post left hip replacement  Rationale for Evaluation and Treatment Rehabilitation  ONSET DATE: 12/16/21  SUBJECTIVE:   SUBJECTIVE STATEMENT: Patient states his right hip is still bothering him, he got a shot in it the other day.   PERTINENT HISTORY: obesity, depression, advanced hip OA, history of morbid obesity (was 600  pounds), history of low back pain  PAIN:   Are you having pain? Yes: NPRS scale: 2-3/10 Pain location: Rt hip > L hip Pain description: sore Aggravating factors: standing Relieving factors: movement  PRECAUTIONS: None  WEIGHT BEARING RESTRICTIONS No  FALLS:  Has patient fallen in last 6 months? No  LIVING ENVIRONMENT: Lives with: lives alone Lives in: House/apartment Stairs: No Has following equipment at home: Single point cane, Environmental consultant - 2 wheeled, and Environmental consultant - 4 wheeled  OCCUPATION: Disability  PLOF: Independent  PATIENT GOALS get the leg better and improve walking   OBJECTIVE: (objective measures from initial evaluation unless otherwise dated) PATIENT SURVEYS:  LEFS 20/80  COGNITION:  Overall cognitive status: Within functional limits for tasks assessed     SENSATION: WFL  POSTURE: rounded shoulders and forward head  PALPATION: TTP grossly throughout L hip/quads  LOWER EXTREMITY  ROM:  Active ROM Right eval Left eval  Hip flexion    Hip extension    Hip abduction    Hip adduction    Hip internal rotation    Hip external rotation    Knee flexion    Knee extension    Ankle dorsiflexion    Ankle plantarflexion    Ankle inversion    Ankle eversion     (Blank rows = not tested)  LOWER EXTREMITY MMT:  MMT Right eval Left eval  Hip flexion 4- 3-  Hip extension    Hip abduction    Hip adduction    Hip internal rotation    Hip external rotation    Knee flexion 5 5  Knee extension 5 5  Ankle dorsiflexion 5 5  Ankle plantarflexion    Ankle inversion    Ankle eversion     (Blank rows = not tested)    FUNCTIONAL TESTS:  2 minute walk test: 150 feet with RW Transfers: labored with UE support Stair: 4 inch stairs with alternating pattern, requires bilateral UE support 12/31/21: 5 STS '2\' 53"'$  on black foam  2MWT 286f with RW  GAIT: Distance walked: 150 Assistive device utilized: WEnvironmental consultant- 2 wheeled Level of assistance: Modified  independence Comments: 2MWT, limited L knee flexion/extension throughout, antalgic    TODAY'S TREATMENT: 01/05/22 Nustep seat 11 level 3 5 minutes Step up 4 inch 2x 10  Lateral step up 4 inch 2 x 10  Seated march 2x 10 bilateral  Standing hip abduction RTB at knees 2x 10 bilateral  Standing hip extension RTB at knees 2x 10 bilateral  LAQ 1x 10 5 second holds bilateral Standing hamstring curl 3 x 10 bilateral Rockerboard 3 x 1 minute   12/31/21: Standing: 3D hip excursion (weight shifting, rotation, squat front of chair with HHA, extension) 10x each Rockerboard 297m lateral with HHA 5STS elevated height 2'53" no HHA (black foam) 2MWT 20461fith RW Toe tapping 6in step height, required 1 HHA Sidestep 2RT inside // bars  12/29/21  Warm up and consitent AROM for hip using Nustep level 2 five mins, seat level 12  There-ex = in // bars    - Standing Heel raises B LE x 20    - Standing toe raises B LE x 20    - Standing hip abduction B LE each 2 sets x 10 reps   - Standing hip flexion marching knee up x 10 reps standing on R LE   - Mini squats 3 x 10 reps with cue for increased left weight shift onto L LE, B UE hands mod use    - Standing hip extension B LE each 2 sets x 10 reps    - Standing black foam normal BOS x 60 sec, narrow BOS x 30 sec with cue for equal WB, shift to left as able  There-ex = sitting    - hip adduction iso verse blue gym ball x 25 reps   - LAQ 10 x 2-3 sec hold L LE with 2lb weight cue for distal quad activation hold     12/24/21 Bridge 2x 10 Hooklying clamshell GTB 2x 10  Bent knee fall outs 2x 10 LAQ 10 x 5 second holds LLE Supine heel slides 2 x 10 LLE Seated hip hip adduction isometric 10 x 10 seconds Standing hip abduction 2 x 10 bilateral  Standing hip extension 2x 10 bilateral  Seated march 2x 10 bilateral  Standing hamstring curls 2 x 10  bilateral  Mini squat 3x 10    PATIENT EDUCATION:  Education details: 12/24/21 exercise mechanics, rest  breaks when needed 12/22/21 HEP;  Eval: Patient educated on exam findings, POC, scope of PT, HEP, and exercise mechanics. Person educated: Patient Education method: Explanation, Demonstration, and Handouts Education comprehension: verbalized understanding, returned demonstration, verbal cues required, and tactile cues required   HOME EXERCISE PROGRAM: Access Code: [email or web address] 12/31/21:  12/22/21 - Standing Hip Abduction with Counter Support  - 3 x daily - 7 x weekly - 2 sets - 10 reps - Mini Squat with Counter Support  - 3 x daily - 7 x weekly - 2 sets - 10 reps  Date: 12/18/2021 - Supine Ankle Pumps  - 3 x daily - 7 x weekly - 20 reps - Supine Gluteal Sets  - 3 x daily - 7 x weekly - 10 reps - 10 second hold - Supine Heel Slide with Strap  - 3 x daily - 7 x weekly - 2 sets - 10 reps - Seated Long Arc Quad (Mirrored)  - 3 x daily - 7 x weekly - 1 sets - 10 reps - 5 second hold   ASSESSMENT:  CLINICAL IMPRESSION: Began session with nustep for dynamic warm up and conditioning. Added step up exercises for functional strengthening. Continued with glute and LE strengthening and progressed resistance/reps as able as patient is demonstrating improving strength and activity tolerance. Patient progressing well and remains limited by R hip which he is preparing to have replaced. Patient will continue to benefit from physical therapy in order to improve function and reduce impairment.   OBJECTIVE IMPAIRMENTS Abnormal gait, decreased activity tolerance, decreased balance, decreased endurance, decreased mobility, difficulty walking, decreased ROM, decreased strength, increased edema, increased muscle spasms, impaired flexibility, improper body mechanics, and pain.   ACTIVITY LIMITATIONS carrying, lifting, bending, standing, squatting, sleeping, stairs, transfers, bathing, dressing, hygiene/grooming, locomotion level, and caring for others  PARTICIPATION LIMITATIONS: meal prep, cleaning, laundry, shopping,  community activity, and yard work  PERSONAL FACTORS Time since onset of injury/illness/exacerbation and 3+ comorbidities: obesity, depression, advanced hip OA, history of morbid obesity (was 600 pounds), history of low back pain  are also affecting patient's functional outcome.   REHAB POTENTIAL: Good  CLINICAL DECISION MAKING: Stable/uncomplicated  EVALUATION COMPLEXITY: Low   GOALS: Goals reviewed with patient? Yes  SHORT TERM GOALS: Target date: 01/08/2022  Patient will be independent with HEP in order to improve functional outcomes. Baseline: 12/31/21:  Reports compliance with HEP daily.   Goal status: IN PROGRESS  2.  Patient will report at least 25% improvement in symptoms for improved quality of life. Baseline: 12/31/21:  Reports improvements by 6% Goal status: IN PROGRESS   LONG TERM GOALS: Target date: 01/29/2022  Patient will report at least 75% improvement in symptoms for improved quality of life. Baseline:  Goal status: IN PROGRESS  2.  Patient will improve LEFS core by at least 18 points in order to indicate improved tolerance to activity. Baseline: 20/80 Goal status: IN PROGRESS  3.  Patient will be able to navigate standard stairs with reciprocal pattern without compensation on LLE in order to demonstrate improved LE strength. Baseline: 4 inch steps with UE support; 12/31/21: stairs being used, unable to assess this session. Goal status: IN PROGRESS  4.  Patient will be able to ambulate at least 350 feet with LRAD in 2MWT in order to demonstrate improved tolerance to activity. Baseline: Eval: 150 with RW; 12/31/21: 2MWT 268f with RW Goal status:  IN PROGRESS  5.  Patient will demonstrate grade of 5/5 MMT grade in all tested musculature in LLE as evidence of improved strength to assist with stair ambulation and gait.   Baseline: see MMT Goal status: IN PROGRESS    PLAN: PT FREQUENCY: 2x/week  PT DURATION: 6 weeks  PLANNED INTERVENTIONS: Therapeutic exercises,  Therapeutic activity, Neuromuscular re-education, Balance training, Gait training, Patient/Family education, Joint manipulation, Joint mobilization, Stair training, Orthotic/Fit training, DME instructions, Aquatic Therapy, Dry Needling, Electrical stimulation, Spinal manipulation, Spinal mobilization, Cryotherapy, Moist heat, Compression bandaging, scar mobilization, Splintting, Taping, Traction, Ultrasound, Ionotophoresis '4mg'$ /ml Dexamethasone, and Manual therapy   PLAN FOR NEXT SESSION:  left hip mobility and strength and progress as tolerated    Vianne Bulls Marlinda Miranda, PT 01/05/2022, 1:00 PM

## 2022-01-07 ENCOUNTER — Ambulatory Visit (HOSPITAL_COMMUNITY): Payer: Medicaid Other

## 2022-01-07 ENCOUNTER — Encounter (HOSPITAL_COMMUNITY): Payer: Self-pay

## 2022-01-07 DIAGNOSIS — R2689 Other abnormalities of gait and mobility: Secondary | ICD-10-CM

## 2022-01-07 DIAGNOSIS — Z96642 Presence of left artificial hip joint: Secondary | ICD-10-CM | POA: Diagnosis not present

## 2022-01-07 DIAGNOSIS — R29898 Other symptoms and signs involving the musculoskeletal system: Secondary | ICD-10-CM

## 2022-01-07 DIAGNOSIS — M6281 Muscle weakness (generalized): Secondary | ICD-10-CM | POA: Diagnosis not present

## 2022-01-07 DIAGNOSIS — M25552 Pain in left hip: Secondary | ICD-10-CM

## 2022-01-07 NOTE — Therapy (Signed)
OUTPATIENT PHYSICAL THERAPY TREATMENT   Patient Name: [name] MRN: [id number] DOB:1970/07/09, 51 y.o., male Today's Date: 01/07/2022   PT End of Session - 01/07/22 1307     Visit Number 7    Number of Visits 12    Date for PT Re-Evaluation 01/29/22    Authorization Type Medicaid [employer]    Authorization Time Period 8 visits approved 12/18/21 -02/15/22 - will need to request more if needed    Authorization - Visit Number 7    Authorization - Number of Visits 8    PT Start Time 1302    PT Stop Time 1348    PT Time Calculation (min) 46 min    Activity Tolerance Patient tolerated treatment well    Behavior During Therapy [facility] for tasks assessed/performed               Past Medical History:  Diagnosis Date   ALLERGY 08/03/2008   Qualifier: Diagnosis of  By: Christ Kick     Anemia    Arthritis    "hips" (01/15/2015)   Blood transfusion without reported diagnosis    Cellulitis 07/25/2018   Chronic back pain greater than 3 months duration 02/11/2017   Chronic lower back pain    Colon polyps    COLONIC POLYPS, ADENOMATOUS 08/03/2008   Qualifier: Diagnosis of  By: Christ Kick     CVA 08/03/2008   Qualifier: History of  By: Christ Kick     Depression    denies   Diabetes mellitus    diet controlled- no meds since wt loss   Diabetes mellitus ([place]) 07/10/2019   Dysrhythmia    s/p surgery bariatric- cardioversion   Erectile dysfunction 01/26/2018   GERD (gastroesophageal reflux disease)    HIATAL HERNIA 08/03/2008   Qualifier: Diagnosis of  By: Christ Kick     Hip pain    History of gout    Hyperlipidemia    Hypertension    Joint pain    Narcotic dependence ([place]) 02/11/2017   Neuromuscular disorder ([place])    PONV (postoperative nausea and vomiting)    Prediabetes 03/29/2017   Sleep apnea    no longer have to use cpap since wt loss   Stage 3 chronic kidney disease ([place]) 01/26/2018   Stroke ([place]) 2006   denies residual on 01/15/2015    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 ([place]) 08/01/2018   Past Surgical History:  Procedure Laterality Date   BARIATRIC SURGERY  2010   in [place]  02/26/2021   Procedure: BIOPSY;  Surgeon: Harvel Quale, MD;  [facility];  Service: Gastroenterology;;   CARDIOVERSION     COLONOSCOPY N/A 08/02/2014   Procedure: COLONOSCOPY;  Surgeon: Rogene Houston, MD;  [facility];  Service: Endoscopy;  Laterality: N/A;  210 - moved to 3/10 @ 11:55 - Ann notified pt   COLONOSCOPY WITH PROPOFOL N/A 02/26/2021   Procedure: COLONOSCOPY WITH PROPOFOL;  Surgeon: Harvel Quale, MD;  [facility];  Service: Gastroenterology;  Laterality: N/A;  10:00   ESOPHAGOGASTRODUODENOSCOPY     ESOPHAGOGASTRODUODENOSCOPY (EGD) WITH PROPOFOL N/A 02/26/2021   Procedure: ESOPHAGOGASTRODUODENOSCOPY (EGD) WITH PROPOFOL;  Surgeon: Harvel Quale, MD;  [facility];  Service: Gastroenterology;  Laterality: N/A;   HERNIA REPAIR     LIPOSUCTION     PANNICULECTOMY  01/14/2015   PANNICULECTOMY N/A 01/14/2015   Procedure:  TOTAL PANNICULECTOMY WTH LIPOSUCTION OF SIDES;  Surgeon:  Cristine Polio, MD;  Location: [place];  Service: Plastics;  Laterality: N/A;   POLYPECTOMY  02/26/2021   Procedure: POLYPECTOMY INTESTINAL;  Surgeon: Harvel Quale, MD;  [facility];  Service: Gastroenterology;;   TONSILLECTOMY     TOTAL HIP ARTHROPLASTY Left 12/16/2021   Procedure: TOTAL HIP ARTHROPLASTY ANTERIOR APPROACH;  Surgeon: Renette Butters, MD;  Location: [facility];  Service: Orthopedics;  Laterality: Left;   VENTRAL HERNIA REPAIR  01/14/2015   VENTRAL HERNIA REPAIR N/A 01/14/2015   Procedure: HERNIA REPAIR VENTRAL ADULT AND MUSCLE REPAIR;  Surgeon: Cristine Polio, MD;  Location: [place];  Service: Plastics;  Laterality: N/A;   Patient Active Problem List   Diagnosis Date Noted   S/P total left hip arthroplasty 12/16/2021   Need for  varicella vaccine 10/16/2021   OSA  09/11/2021   Pre-operative clearance 06/19/2021   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   Immunization due 02/06/2021   Iron deficiency anemia 12/30/2020   History of colonic polyps 12/30/2020   Unilateral primary osteoarthritis, right hip 08/13/2020   Unilateral primary osteoarthritis, left hip 08/13/2020   Muscle spasms of both lower extremities 05/23/2020   Need for immunization against influenza 03/06/2020   Primary osteoarthritis of both hips 07/10/2019   Bilateral primary osteoarthritis of hip 07/10/2019   Vitamin D deficiency 04/13/2019   Morbid obesity with body mass index (BMI) of 40.0 to 44.9 in adult ([place]) 04/13/2019   Depressive disorder 04/13/2019   Body mass index (BMI) 45.0-49.9, adult ([place]) 04/13/2019   Kidney disease, chronic, stage III (GFR 30-59 ml/min) ([place]) 07/20/2017   History of cerebrovascular accident 07/20/2017   Gastroesophageal reflux disease 07/20/2017   Gastric bypass status for obesity 02/11/2017   Benzodiazepine dependence ([place]) 02/11/2017   Type 2 diabetes mellitus with diabetic nephropathy ([place]) 08/03/2008   Hyperlipidemia 08/03/2008   High blood pressure 08/03/2008   SLEEP APNEA 08/03/2008   Sleep apnea 08/03/2008    PCP: Vena Rua FNP  REFERRING PROVIDER: Renette Butters, MD   REFERRING DIAG: post op lt THR per [place]onia Highland, MD DOS 12/16/21   THERAPY DIAG:  Pain in left hip  Muscle weakness (generalized)  Other abnormalities of gait and mobility  Other symptoms and signs involving the musculoskeletal system  Rationale for Evaluation and Treatment Rehabilitation  ONSET DATE: 12/16/21  SUBJECTIVE:   SUBJECTIVE STATEMENT: Pt stated main pain in Rt hip, achey pain today.  Lt hip feels good.  Pt arrived ambulating with SPC.  Reports some difficulty getting up from a chair, stated causes back to lock up.    PERTINENT HISTORY: obesity,  depression, advanced hip OA, history of morbid obesity (was 600 pounds), history of low back pain  PAIN:   Are you having pain? Yes: NPRS scale: 2-3/10 Pain location: Rt hip > L hip Pain description: sore Aggravating factors: standing Relieving factors: movement  PRECAUTIONS: None  WEIGHT BEARING RESTRICTIONS No  FALLS:  Has patient fallen in last 6 months? No  LIVING ENVIRONMENT: Lives with: lives alone Lives in: House/apartment Stairs: No Has following equipment at home: Single point cane, Environmental consultant - 2 wheeled, and Environmental consultant - 4 wheeled  OCCUPATION: Disability  PLOF: Independent  PATIENT GOALS get the leg better and improve walking   OBJECTIVE: (objective measures from initial evaluation unless otherwise dated) PATIENT SURVEYS:  LEFS 20/80  COGNITION:  Overall cognitive status: Within functional limits for tasks assessed     SENSATION: WFL  POSTURE: rounded  shoulders and forward head  PALPATION: TTP grossly throughout L hip/quads  LOWER EXTREMITY ROM:  Active ROM Right eval Left eval  Hip flexion    Hip extension    Hip abduction    Hip adduction    Hip internal rotation    Hip external rotation    Knee flexion    Knee extension    Ankle dorsiflexion    Ankle plantarflexion    Ankle inversion    Ankle eversion     (Blank rows = not tested)  LOWER EXTREMITY MMT:  MMT Right eval Left eval  Hip flexion 4- 3-  Hip extension    Hip abduction    Hip adduction    Hip internal rotation    Hip external rotation    Knee flexion 5 5  Knee extension 5 5  Ankle dorsiflexion 5 5  Ankle plantarflexion    Ankle inversion    Ankle eversion     (Blank rows = not tested)    FUNCTIONAL TESTS:  2 minute walk test: 150 feet with RW Transfers: labored with UE support Stair: 4 inch stairs with alternating pattern, requires bilateral UE support 12/31/21: 5 STS '2\' 53"'$  on black foam  2MWT 239f with RW  GAIT: Distance walked: 150 Assistive device utilized:  WEnvironmental consultant- 2 wheeled Level of assistance: Modified independence Comments: 2MWT, limited L knee flexion/extension throughout, antalgic    TODAY'S TREATMENT: 01/07/22:    Nustep seat 11 level 3 5 minutes, average SPM 100   2MWT with SPC 2371f   Leg press (difficult getting Rt LE in place) 2Pl 20x   Bodycraft Retro gait 3Pl 5RT   Bodycraft sidestep 2Pl 4RT   Forward step up 4in step 15x 1 HHA        01/05/22 Nustep seat 11 level 3 5 minutes Step up 4 inch 2x 10  Lateral step up 4 inch 2 x 10  Seated march 2x 10 bilateral  Standing hip abduction RTB at knees 2x 10 bilateral  Standing hip extension RTB at knees 2x 10 bilateral  LAQ 1x 10 5 second holds bilateral Standing hamstring curl 3 x 10 bilateral Rockerboard 3 x 1 minute   12/31/21: Standing: 3D hip excursion (weight shifting, rotation, squat front of chair with HHA, extension) 10x each Rockerboard [id number] with HHA 5STS elevated height 2'53" no HHA (black foam) 2MWT [id number] RW Toe tapping 6in step height, required 1 HHA Sidestep 2RT inside // bars  12/29/21  Warm up and consitent AROM for hip using Nustep level 2 five mins, seat level 12  There-ex = in // bars    - Standing Heel raises B LE x 20    - Standing toe raises B LE x 20    - Standing hip abduction B LE each 2 sets x 10 reps   - Standing hip flexion marching knee up x 10 reps standing on R LE   - Mini squats 3 x 10 reps with cue for increased left weight shift onto L LE, B UE hands mod use    - Standing hip extension B LE each 2 sets x 10 reps    - Standing black foam normal BOS x 60 sec, narrow BOS x 30 sec with cue for equal WB, shift to left as able  There-ex = sitting    - hip adduction iso verse blue gym ball x 25 reps   - LAQ 10 x 2-3 sec hold L LE with 2lb  weight cue for distal quad activation hold     12/24/21 Bridge 2x 10 Hooklying clamshell GTB 2x 10  Bent knee fall outs 2x 10 LAQ 10 x 5 second holds LLE Supine heel slides 2 x 10 LLE Seated  hip hip adduction isometric 10 x 10 seconds Standing hip abduction 2 x 10 bilateral  Standing hip extension 2x 10 bilateral  Seated march 2x 10 bilateral  Standing hamstring curls 2 x 10 bilateral  Mini squat 3x 10    PATIENT EDUCATION:  Education details: 12/24/21 exercise mechanics, rest breaks when needed 12/22/21 HEP;  Eval: Patient educated on exam findings, POC, scope of PT, HEP, and exercise mechanics. Person educated: Patient Education method: Explanation, Demonstration, and Handouts Education comprehension: verbalized understanding, returned demonstration, verbal cues required, and tactile cues required   HOME EXERCISE PROGRAM: Access Code: [email or web address] 12/31/21:  12/22/21 - Standing Hip Abduction with Counter Support  - 3 x daily - 7 x weekly - 2 sets - 10 reps - Mini Squat with Counter Support  - 3 x daily - 7 x weekly - 2 sets - 10 reps  Date: 12/18/2021 - Supine Ankle Pumps  - 3 x daily - 7 x weekly - 20 reps - Supine Gluteal Sets  - 3 x daily - 7 x weekly - 10 reps - 10 second hold - Supine Heel Slide with Strap  - 3 x daily - 7 x weekly - 2 sets - 10 reps - Seated Long Arc Quad (Mirrored)  - 3 x daily - 7 x weekly - 1 sets - 10 reps - 5 second hold   ASSESSMENT:  CLINICAL IMPRESSION: Began session with nustep for dynamic warm up and mobility.  Session focus with hip and functional strengthening.  Added retro and sidestepping with Body craft machine for gluteal strengthening and to improve SLS with gait.  Pt liked new machine, noted crepitus Rt hip during sidestep, pain monitored through session.    OBJECTIVE IMPAIRMENTS Abnormal gait, decreased activity tolerance, decreased balance, decreased endurance, decreased mobility, difficulty walking, decreased ROM, decreased strength, increased edema, increased muscle spasms, impaired flexibility, improper body mechanics, and pain.   ACTIVITY LIMITATIONS carrying, lifting, bending, standing, squatting, sleeping, stairs, transfers,  bathing, dressing, hygiene/grooming, locomotion level, and caring for others  PARTICIPATION LIMITATIONS: meal prep, cleaning, laundry, shopping, community activity, and yard work  PERSONAL FACTORS Time since onset of injury/illness/exacerbation and 3+ comorbidities: obesity, depression, advanced hip OA, history of morbid obesity (was 600 pounds), history of low back pain  are also affecting patient's functional outcome.   REHAB POTENTIAL: Good  CLINICAL DECISION MAKING: Stable/uncomplicated  EVALUATION COMPLEXITY: Low   GOALS: Goals reviewed with patient? Yes  SHORT TERM GOALS: Target date: 01/08/2022  Patient will be independent with HEP in order to improve functional outcomes. Baseline: 12/31/21:  Reports compliance with HEP daily.   Goal status: IN PROGRESS  2.  Patient will report at least 25% improvement in symptoms for improved quality of life. Baseline: 12/31/21:  Reports improvements by 6% Goal status: IN PROGRESS   LONG TERM GOALS: Target date: 01/29/2022  Patient will report at least 75% improvement in symptoms for improved quality of life. Baseline:  Goal status: IN PROGRESS  2.  Patient will improve LEFS core by at least 18 points in order to indicate improved tolerance to activity. Baseline: 20/80 Goal status: IN PROGRESS  3.  Patient will be able to navigate standard stairs with reciprocal pattern without compensation on LLE in order to demonstrate  improved LE strength. Baseline: 4 inch steps with UE support; 12/31/21: stairs being used, unable to assess this session. Goal status: IN PROGRESS  4.  Patient will be able to ambulate at least 350 feet with LRAD in 2MWT in order to demonstrate improved tolerance to activity. Baseline: Eval: 150 with RW; 12/31/21: 2MWT 238f with RW Goal status: IN PROGRESS  5.  Patient will demonstrate grade of 5/5 MMT grade in all tested musculature in LLE as evidence of improved strength to assist with stair ambulation and gait.    Baseline: see MMT Goal status: IN PROGRESS    PLAN: PT FREQUENCY: 2x/week  PT DURATION: 6 weeks  PLANNED INTERVENTIONS: Therapeutic exercises, Therapeutic activity, Neuromuscular re-education, Balance training, Gait training, Patient/Family education, Joint manipulation, Joint mobilization, Stair training, Orthotic/Fit training, DME instructions, Aquatic Therapy, Dry Needling, Electrical stimulation, Spinal manipulation, Spinal mobilization, Cryotherapy, Moist heat, Compression bandaging, scar mobilization, Splintting, Taping, Traction, Ultrasound, Ionotophoresis '4mg'$ /ml Dexamethasone, and Manual therapy   PLAN FOR NEXT SESSION:  Review goals next session.  Left hip mobility and strength and progress as tolerated   CIhor Austin LPTA/CLT; CBIS [phone] CAldona Lento PTA 01/07/2022, 3:28 PM

## 2022-01-08 DIAGNOSIS — Z79899 Other long term (current) drug therapy: Secondary | ICD-10-CM | POA: Diagnosis not present

## 2022-01-12 ENCOUNTER — Encounter (HOSPITAL_COMMUNITY): Payer: Medicaid Other | Admitting: Physical Therapy

## 2022-01-13 DIAGNOSIS — G4733 Obstructive sleep apnea (adult) (pediatric): Secondary | ICD-10-CM | POA: Diagnosis not present

## 2022-01-14 ENCOUNTER — Ambulatory Visit (HOSPITAL_COMMUNITY): Payer: Medicaid Other

## 2022-01-14 ENCOUNTER — Encounter (HOSPITAL_COMMUNITY): Payer: Self-pay

## 2022-01-14 DIAGNOSIS — R29898 Other symptoms and signs involving the musculoskeletal system: Secondary | ICD-10-CM

## 2022-01-14 DIAGNOSIS — M6281 Muscle weakness (generalized): Secondary | ICD-10-CM | POA: Diagnosis not present

## 2022-01-14 DIAGNOSIS — M25552 Pain in left hip: Secondary | ICD-10-CM

## 2022-01-14 DIAGNOSIS — Z96642 Presence of left artificial hip joint: Secondary | ICD-10-CM | POA: Diagnosis not present

## 2022-01-14 DIAGNOSIS — R2689 Other abnormalities of gait and mobility: Secondary | ICD-10-CM

## 2022-01-14 NOTE — Therapy (Signed)
OUTPATIENT PHYSICAL THERAPY TREATMENT   Patient Name: [name] MRN: [id number] DOB:Jun 28, 1970, 51 y.o., male Today's Date: 01/14/2022 PHYSICAL THERAPY DISCHARGE SUMMARY  Visits from Start of Care: 8  Current functional level related to goals / functional outcomes: See below   Remaining deficits: See below   Education / Equipment: See below   Patient agrees to discharge. Patient goals were met. Patient is being discharged due to meeting the stated rehab goals.     PT End of Session - 01/14/22 1357     Visit Number 8    Number of Visits 12    Date for PT Re-Evaluation 01/29/22    Authorization Type Medicaid [employer]    Authorization Time Period 8 visits approved 12/18/21 -02/15/22 - will need to request more if needed    Authorization - Visit Number 8    Authorization - Number of Visits 8    PT Start Time 8588    PT Stop Time 1345    PT Time Calculation (min) 40 min    Activity Tolerance Patient tolerated treatment well    Behavior During Therapy [facility] for tasks assessed/performed                Past Medical History:  Diagnosis Date   ALLERGY 08/03/2008   Qualifier: Diagnosis of  By: Christ Kick     Anemia    Arthritis    "hips" (01/15/2015)   Blood transfusion without reported diagnosis    Cellulitis 07/25/2018   Chronic back pain greater than 3 months duration 02/11/2017   Chronic lower back pain    Colon polyps    COLONIC POLYPS, ADENOMATOUS 08/03/2008   Qualifier: Diagnosis of  By: Christ Kick     CVA 08/03/2008   Qualifier: History of  By: Christ Kick     Depression    denies   Diabetes mellitus    diet controlled- no meds since wt loss   Diabetes mellitus ([place]) 07/10/2019   Dysrhythmia    s/p surgery bariatric- cardioversion   Erectile dysfunction 01/26/2018   GERD (gastroesophageal reflux disease)    HIATAL HERNIA 08/03/2008   Qualifier: Diagnosis of  By: Christ Kick     Hip pain    History of gout     Hyperlipidemia    Hypertension    Joint pain    Narcotic dependence ([place]) 02/11/2017   Neuromuscular disorder ([place])    PONV (postoperative nausea and vomiting)    Prediabetes 03/29/2017   Sleep apnea    no longer have to use cpap since wt loss   Stage 3 chronic kidney disease ([place]) 01/26/2018   Stroke ([place]) 2006   denies residual on 01/15/2015   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 ([place]) 08/01/2018   Past Surgical History:  Procedure Laterality Date   BARIATRIC SURGERY  2010   in [place]  02/26/2021   Procedure: BIOPSY;  Surgeon: Harvel Quale, MD;  [facility];  Service: Gastroenterology;;   CARDIOVERSION     COLONOSCOPY N/A 08/02/2014   Procedure: COLONOSCOPY;  Surgeon: Rogene Houston, MD;  [facility];  Service: Endoscopy;  Laterality: N/A;  210 - moved to 3/10 @ 11:55 - Ann notified pt   COLONOSCOPY WITH PROPOFOL N/A 02/26/2021   Procedure: COLONOSCOPY WITH PROPOFOL;  Surgeon: Harvel Quale, MD;  [facility];  Service: Gastroenterology;  Laterality: N/A;  10:00   ESOPHAGOGASTRODUODENOSCOPY     ESOPHAGOGASTRODUODENOSCOPY (EGD) WITH  PROPOFOL N/A 02/26/2021   Procedure: ESOPHAGOGASTRODUODENOSCOPY (EGD) WITH PROPOFOL;  Surgeon: Harvel Quale, MD;  [facility];  Service: Gastroenterology;  Laterality: N/A;   HERNIA REPAIR     LIPOSUCTION     PANNICULECTOMY  01/14/2015   PANNICULECTOMY N/A 01/14/2015   Procedure:  TOTAL PANNICULECTOMY WTH LIPOSUCTION OF SIDES;  Surgeon: Cristine Polio, MD;  Location: [place];  Service: Plastics;  Laterality: N/A;   POLYPECTOMY  02/26/2021   Procedure: POLYPECTOMY INTESTINAL;  Surgeon: Harvel Quale, MD;  [facility];  Service: Gastroenterology;;   TONSILLECTOMY     TOTAL HIP ARTHROPLASTY Left 12/16/2021   Procedure: TOTAL HIP ARTHROPLASTY ANTERIOR APPROACH;  Surgeon: Renette Butters, MD;  Location: [facility];  Service: Orthopedics;   Laterality: Left;   VENTRAL HERNIA REPAIR  01/14/2015   VENTRAL HERNIA REPAIR N/A 01/14/2015   Procedure: HERNIA REPAIR VENTRAL ADULT AND MUSCLE REPAIR;  Surgeon: Cristine Polio, MD;  Location: [place];  Service: Plastics;  Laterality: N/A;   Patient Active Problem List   Diagnosis Date Noted   S/P total left hip arthroplasty 12/16/2021   Need for varicella vaccine 10/16/2021   OSA  09/11/2021   Pre-operative clearance 06/19/2021   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   Immunization due 02/06/2021   Iron deficiency anemia 12/30/2020   History of colonic polyps 12/30/2020   Unilateral primary osteoarthritis, right hip 08/13/2020   Unilateral primary osteoarthritis, left hip 08/13/2020   Muscle spasms of both lower extremities 05/23/2020   Need for immunization against influenza 03/06/2020   Primary osteoarthritis of both hips 07/10/2019   Bilateral primary osteoarthritis of hip 07/10/2019   Vitamin D deficiency 04/13/2019   Morbid obesity with body mass index (BMI) of 40.0 to 44.9 in adult ([place]) 04/13/2019   Depressive disorder 04/13/2019   Body mass index (BMI) 45.0-49.9, adult ([place]) 04/13/2019   Kidney disease, chronic, stage III (GFR 30-59 ml/min) ([place]) 07/20/2017   History of cerebrovascular accident 07/20/2017   Gastroesophageal reflux disease 07/20/2017   Gastric bypass status for obesity 02/11/2017   Benzodiazepine dependence ([place]) 02/11/2017   Type 2 diabetes mellitus with diabetic nephropathy ([place]) 08/03/2008   Hyperlipidemia 08/03/2008   High blood pressure 08/03/2008   SLEEP APNEA 08/03/2008   Sleep apnea 08/03/2008    PCP: Vena Rua FNP  REFERRING PROVIDER: Renette Butters, MD   REFERRING DIAG: post op lt THR per Fredonia Highland, MD DOS 12/16/21   THERAPY DIAG:  Pain in left hip  Muscle weakness (generalized)  Other abnormalities of gait and mobility  Other symptoms and signs involving the musculoskeletal  system  Rationale for Evaluation and Treatment Rehabilitation  ONSET DATE: 12/16/21  SUBJECTIVE:   SUBJECTIVE STATEMENT: Pt stated main problem with Rt hip, no reports of pain in Lt hip.  Took pain medication prior therapy today  PERTINENT HISTORY: obesity, depression, advanced hip OA, history of morbid obesity (was 600 pounds), history of low back pain  PAIN:   Are you having pain? Yes: NPRS scale: 3/10 Pain location: Rt hip Pain description: sore Aggravating factors: standing Relieving factors: movement  PRECAUTIONS: None  WEIGHT BEARING RESTRICTIONS No  FALLS:  Has patient fallen in last 6 months? No  LIVING ENVIRONMENT: Lives with: lives alone Lives in: House/apartment Stairs: No Has following equipment at home: Single point cane, Environmental consultant - 2 wheeled, and Environmental consultant - 4 wheeled  OCCUPATION: Disability  PLOF: Independent  PATIENT GOALS get the leg better  and improve walking   OBJECTIVE: (objective measures from initial evaluation unless otherwise dated) PATIENT SURVEYS:  LEFS 20/80  8/23: 25/80  COGNITION:  Overall cognitive status: Within functional limits for tasks assessed     SENSATION: WFL  POSTURE: rounded shoulders and forward head  PALPATION: TTP grossly throughout L hip/quads  LOWER EXTREMITY ROM:  Active ROM Right eval Left eval  Hip flexion    Hip extension    Hip abduction    Hip adduction    Hip internal rotation    Hip external rotation    Knee flexion    Knee extension    Ankle dorsiflexion    Ankle plantarflexion    Ankle inversion    Ankle eversion     (Blank rows = not tested)  LOWER EXTREMITY MMT:  MMT Right eval Left eval Right 01/14/22 Left  01/14/22  Hip flexion 4- 3- 3/5 3/5  Hip extension   2+ decreased range; pain full 3+  Hip abduction   2/5 decreased range 3+  Hip adduction      Hip internal rotation      Hip external rotation      Knee flexion '5 5 5 5  ' Knee extension '5 5 5 5  ' Ankle dorsiflexion '5 5 5 5   ' Ankle plantarflexion      Ankle inversion      Ankle eversion       (Blank rows = not tested)    FUNCTIONAL TESTS:  2 minute walk test: 150 feet with RW Transfers: labored with UE support Stair: 4 inch stairs with alternating pattern, requires bilateral UE support 12/31/21: 5 STS '2\' 53"'  on black foam  2MWT 246f with RW 01/14/22: 5STS 52.88" standard height, hands on lap  2MWT 2257fwith SPC  Reciprocal pattern 7in step height 2RT with 2 HR  GAIT: Distance walked: 150 Assistive device utilized: Walker - 2 wheeled Level of assistance: Modified independence Comments: 2MWT, limited L knee flexion/extension throughout, antalgic    TODAY'S TREATMENT: 01/14/22: LEFS 25/80 MMT see above 5STS 52.88" 2MWT with SPC encouraged to use cane opposite arm for Rt hip pain control 22617fith SPC Supine: Bridge- added to HEPeBayn height 2HR 2RT  01/07/22:    Nustep seat 11 level 3 5 minutes, average SPM 100   2MWT with SPC 232f74f Leg press (difficult getting Rt LE in place) 2Pl 20x   Bodycraft Retro gait 3Pl 5RT   Bodycraft sidestep 2Pl 4RT   Forward step up 4in step 15x 1 HHA        01/05/22 Nustep seat 11 level 3 5 minutes Step up 4 inch 2x 10  Lateral step up 4 inch 2 x 10  Seated march 2x 10 bilateral  Standing hip abduction RTB at knees 2x 10 bilateral  Standing hip extension RTB at knees 2x 10 bilateral  LAQ 1x 10 5 second holds bilateral Standing hamstring curl 3 x 10 bilateral Rockerboard 3 x 1 minute   12/31/21: Standing: 3D hip excursion (weight shifting, rotation, squat front of chair with HHA, extension) 10x each Rockerboard 2min63mteral with HHA 5STS elevated height 2'53" no HHA (black foam) 2MWT 204ft 45f RW Toe tapping 6in step height, required 1 HHA Sidestep 2RT inside // bars  12/29/21  Warm up and consitent AROM for hip using Nustep level 2 five mins, seat level 12  There-ex = in // bars    - Standing Heel raises B LE x 20    -  Standing toe raises  B LE x 20    - Standing hip abduction B LE each 2 sets x 10 reps   - Standing hip flexion marching knee up x 10 reps standing on R LE   - Mini squats 3 x 10 reps with cue for increased left weight shift onto L LE, B UE hands mod use    - Standing hip extension B LE each 2 sets x 10 reps    - Standing black foam normal BOS x 60 sec, narrow BOS x 30 sec with cue for equal WB, shift to left as able  There-ex = sitting    - hip adduction iso verse blue gym ball x 25 reps   - LAQ 10 x 2-3 sec hold L LE with 2lb weight cue for distal quad activation hold     12/24/21 Bridge 2x 10 Hooklying clamshell GTB 2x 10  Bent knee fall outs 2x 10 LAQ 10 x 5 second holds LLE Supine heel slides 2 x 10 LLE Seated hip hip adduction isometric 10 x 10 seconds Standing hip abduction 2 x 10 bilateral  Standing hip extension 2x 10 bilateral  Seated march 2x 10 bilateral  Standing hamstring curls 2 x 10 bilateral  Mini squat 3x 10    PATIENT EDUCATION:  Education details: 12/24/21 exercise mechanics, rest breaks when needed 12/22/21 HEP;  Eval: Patient educated on exam findings, POC, scope of PT, HEP, and exercise mechanics. Person educated: Patient Education method: Explanation, Demonstration, and Handouts Education comprehension: verbalized understanding, returned demonstration, verbal cues required, and tactile cues required   HOME EXERCISE PROGRAM: Access Code: [email or web address] 01/14/22: Bridge 12/22/21 - Standing Hip Abduction with Counter Support  - 3 x daily - 7 x weekly - 2 sets - 10 reps - Mini Squat with Counter Support  - 3 x daily - 7 x weekly - 2 sets - 10 reps  Date: 12/18/2021 - Supine Ankle Pumps  - 3 x daily - 7 x weekly - 20 reps - Supine Gluteal Sets  - 3 x daily - 7 x weekly - 10 reps - 10 second hold - Supine Heel Slide with Strap  - 3 x daily - 7 x weekly - 2 sets - 10 reps - Seated Long Arc Quad (Mirrored)  - 3 x daily - 7 x weekly - 1 sets - 10 reps - 5 second  hold   ASSESSMENT:  CLINICAL IMPRESSION: Reviewed goals with objective testing including: MMT, 2MWT, 5STS, stairs and LEFS.  Pt reports compliance with HEP.  Improve LEFS score 25/80 (was 20/80).  Pt presents with continued hip weakness noted with MMT and limited by Rt hip pain, weakness and mobility.  Added bridge to HEP for gluteal strengthening.  Pt plans for Rt THR later this year and feels this pain limits functional ability.  Does present with improved time with 5STS and increased distance 2MWT with LRAD.  Educated use with cane in opposite hand for Rt hip pain tolerance.   Patient has met 2/2 short term goals and 2/5 long term goals with  ability to complete HEP and improvement in symptoms, strength gait, activity tolerance, functional mobility. He continues to remain limited leading to remaining goals not met but patient also limited by R hip symptoms and will be having R THA later this year. Patient with limited number of PT visits for the year and he is agreeable to transition to HEP to save visits. Patient discharged from PT at this time.  2:50 PM, 01/15/22 Mearl Latin PT, DPT Physical Therapist at [facility]   OBJECTIVE IMPAIRMENTS Abnormal gait, decreased activity tolerance, decreased balance, decreased endurance, decreased mobility, difficulty walking, decreased ROM, decreased strength, increased edema, increased muscle spasms, impaired flexibility, improper body mechanics, and pain.   ACTIVITY LIMITATIONS carrying, lifting, bending, standing, squatting, sleeping, stairs, transfers, bathing, dressing, hygiene/grooming, locomotion level, and caring for others  PARTICIPATION LIMITATIONS: meal prep, cleaning, laundry, shopping, community activity, and yard work  PERSONAL FACTORS Time since onset of injury/illness/exacerbation and 3+ comorbidities: obesity, depression, advanced hip OA, history of morbid obesity (was 600 pounds), history of low back pain   are also affecting patient's functional outcome.   REHAB POTENTIAL: Good  CLINICAL DECISION MAKING: Stable/uncomplicated  EVALUATION COMPLEXITY: Low   GOALS: Goals reviewed with patient? Yes  SHORT TERM GOALS: Target date: 01/08/2022  Patient will be independent with HEP in order to improve functional outcomes. Baseline: 01/14/22:  Reports compliance with HEP daily.  12/31/21:  Reports compliance with HEP daily.   Goal status: MET  2.  Patient will report at least 25% improvement in symptoms for improved quality of life. Baseline: 01/14/22:  Reports 90% improvements with Lt hip.  12/31/21:  Reports improvements by 6% Goal status: MET   LONG TERM GOALS: Target date: 01/29/2022  Patient will report at least 75% improvement in symptoms for improved quality of life. Baseline: 01/14/22:  Reports 90% improvements with Lt hip. Goal status: MET  2.  Patient will improve LEFS core by at least 18 points in order to indicate improved tolerance to activity. Baseline: 20/80; 01/14/22: 25/80 Goal status: IN PROGRESS  3.  Patient will be able to navigate standard stairs with reciprocal pattern without compensation on LLE in order to demonstrate improved LE strength. Baseline: 4 inch steps with UE support; 12/31/21: stairs being used, unable to assess this session.; 01/14/22:  Able to demonstrate reciprocal pattern 7in step with 2HR assistance Goal status: MET  4.  Patient will be able to ambulate at least 350 feet with LRAD in 2MWT in order to demonstrate improved tolerance to activity. Baseline: Eval: 150 with RW; 12/31/21: 2MWT 288f with RW; 01/14/22: 2MWT 2263fwith SPC Goal status: IN PROGRESS  5.  Patient will demonstrate grade of 5/5 MMT grade in all tested musculature in LLE as evidence of improved strength to assist with stair ambulation and gait.   Baseline: see MMT Goal status: IN PROGRESS    PLAN: PT FREQUENCY: 2x/week  PT DURATION: 6 weeks  PLANNED INTERVENTIONS: Therapeutic  exercises, Therapeutic activity, Neuromuscular re-education, Balance training, Gait training, Patient/Family education, Joint manipulation, Joint mobilization, Stair training, Orthotic/Fit training, DME instructions, Aquatic Therapy, Dry Needling, Electrical stimulation, Spinal manipulation, Spinal mobilization, Cryotherapy, Moist heat, Compression bandaging, scar mobilization, Splintting, Taping, Traction, Ultrasound, Ionotophoresis 63m57ml Dexamethasone, and Manual therapy   PLAN FOR NEXT SESSION:  DC to HEP.  Pt preparing for Rt THR later this year.   CasIhor AustinPTA/CLT; CBIS [phone] LentoTA 01/14/2022, 1:57 PM

## 2022-01-19 ENCOUNTER — Other Ambulatory Visit: Payer: Self-pay | Admitting: Nurse Practitioner

## 2022-01-19 ENCOUNTER — Ambulatory Visit (HOSPITAL_COMMUNITY): Payer: Medicaid Other | Admitting: Physical Therapy

## 2022-01-19 DIAGNOSIS — N529 Male erectile dysfunction, unspecified: Secondary | ICD-10-CM

## 2022-01-19 MED ORDER — SILDENAFIL CITRATE 50 MG PO TABS: 50.0000 mg | ORAL_TABLET | Freq: Every day | ORAL | 0 refills | Status: DC | PRN

## 2022-01-20 ENCOUNTER — Ambulatory Visit
Admission: EM | Admit: 2022-01-20 | Discharge: 2022-01-20 | Disposition: A | Discharge status: Home / Self Care | Payer: Medicaid Other | Attending: Nurse Practitioner | Admitting: Nurse Practitioner

## 2022-01-20 DIAGNOSIS — M25551 Pain in right hip: Secondary | ICD-10-CM

## 2022-01-20 MED ORDER — DICLOFENAC SODIUM 1 % EX GEL: 2.0000 g | Freq: Four times a day (QID) | CUTANEOUS | 0 refills | Status: DC

## 2022-01-20 MED ORDER — TIZANIDINE HCL 4 MG PO TABS: 4.0000 mg | ORAL_TABLET | Freq: Three times a day (TID) | ORAL | 0 refills | Status: DC | PRN

## 2022-01-20 NOTE — ED Triage Notes (Signed)
Pt reports right hip pain x 1 week. Oxycodone gives no relief.

## 2022-01-20 NOTE — Discharge Instructions (Addendum)
-   You can use Voltaren gel on the outside of your leg to help with the pain -Can also use tizanidine every 8 hours as needed for muscle spasms.  Do not take this with other medications that are sedating or with alcohol or while driving or operating heavy machinery.  Please stop the methocarbamol while on the tizanidine. -Follow-up with orthopedic provider if your symptoms persist despite treatment

## 2022-01-20 NOTE — ED Provider Notes (Signed)
[facility]    CSN: [id number] Arrival date & time: 01/20/22  1443      History   Chief Complaint Chief Complaint  Patient presents with   Hip Pain    HPI [name] is a 51 y.o. male.   Patient presents with pain in his right hip that moves down his right thigh.  Reports he had physical therapy about a week ago and the pain started after, thinks the pain is in his muscle and thinks he may have "stretch wrong."  He denies any recent accident, trauma, or injury to the right hip.  Reports he recently had left total hip replacement, is currently being worked up for right total hip replacement for osteoarthritis.  Denies pain shooting down his leg to his toes, also denies any weakness with weightbearing or walking, numbness or tingling in his toes, decreased sensation down his leg, swelling, redness, and fevers, nausea/vomiting.  Reports he has taken his regular pain medication which is oxycodone and that does not help the pain in his right thigh muscle.       Past Medical History:  Diagnosis Date   ALLERGY 08/03/2008   Qualifier: Diagnosis of  By: Christ Kick     Anemia    Arthritis    "hips" (01/15/2015)   Blood transfusion without reported diagnosis    Cellulitis 07/25/2018   Chronic back pain greater than 3 months duration 02/11/2017   Chronic lower back pain    Colon polyps    COLONIC POLYPS, ADENOMATOUS 08/03/2008   Qualifier: Diagnosis of  By: Christ Kick     CVA 08/03/2008   Qualifier: History of  By: Christ Kick     Depression    denies   Diabetes mellitus    diet controlled- no meds since wt loss   Diabetes mellitus ([place]) 07/10/2019   Dysrhythmia    s/p surgery bariatric- cardioversion   Erectile dysfunction 01/26/2018   GERD (gastroesophageal reflux disease)    HIATAL HERNIA 08/03/2008   Qualifier: Diagnosis of  By: Christ Kick     Hip pain    History of gout    Hyperlipidemia    Hypertension    Joint pain     Narcotic dependence ([place]) 02/11/2017   Neuromuscular disorder ([place])    PONV (postoperative nausea and vomiting)    Prediabetes 03/29/2017   Sleep apnea    no longer have to use cpap since wt loss   Stage 3 chronic kidney disease ([place]) 01/26/2018   Stroke ([place]) 2006   denies residual on 01/15/2015   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 ([place]) 08/01/2018    Patient Active Problem List   Diagnosis Date Noted   S/P total left hip arthroplasty 12/16/2021   Need for varicella vaccine 10/16/2021   OSA  09/11/2021   Pre-operative clearance 06/19/2021   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   Immunization due 02/06/2021   Iron deficiency anemia 12/30/2020   History of colonic polyps 12/30/2020   Unilateral primary osteoarthritis, right hip 08/13/2020   Unilateral primary osteoarthritis, left hip 08/13/2020   Muscle spasms of both lower extremities 05/23/2020   Need for immunization against influenza 03/06/2020   Primary osteoarthritis of both hips 07/10/2019   Bilateral primary osteoarthritis of hip 07/10/2019   Vitamin D deficiency 04/13/2019   Morbid obesity with body mass index (BMI) of 40.0 to 44.9 in adult [facility]) 04/13/2019   Depressive disorder 04/13/2019  Body mass index (BMI) 45.0-49.9, adult ([place]) 04/13/2019   Kidney disease, chronic, stage III (GFR 30-59 ml/min) (HCC) 07/20/2017   History of cerebrovascular accident 07/20/2017   Gastroesophageal reflux disease 07/20/2017   Gastric bypass status for obesity 02/11/2017   Benzodiazepine dependence ([place]) 02/11/2017   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   Hyperlipidemia 08/03/2008   High blood pressure 08/03/2008   SLEEP APNEA 08/03/2008   Sleep apnea 08/03/2008    Past Surgical History:  Procedure Laterality Date   BARIATRIC SURGERY  2010   in [place]  02/26/2021   Procedure: BIOPSY;  Surgeon: Harvel Quale, MD;   [facility];  Service: Gastroenterology;;   CARDIOVERSION     COLONOSCOPY N/A 08/02/2014   Procedure: COLONOSCOPY;  Surgeon: Rogene Houston, MD;  [facility];  Service: Endoscopy;  Laterality: N/A;  210 - moved to 3/10 @ 11:55 - Ann notified pt   COLONOSCOPY WITH PROPOFOL N/A 02/26/2021   Procedure: COLONOSCOPY WITH PROPOFOL;  Surgeon: Harvel Quale, MD;  [facility];  Service: Gastroenterology;  Laterality: N/A;  10:00   ESOPHAGOGASTRODUODENOSCOPY     ESOPHAGOGASTRODUODENOSCOPY (EGD) WITH PROPOFOL N/A 02/26/2021   Procedure: ESOPHAGOGASTRODUODENOSCOPY (EGD) WITH PROPOFOL;  Surgeon: Harvel Quale, MD;  [facility];  Service: Gastroenterology;  Laterality: N/A;   HERNIA REPAIR     LIPOSUCTION     PANNICULECTOMY  01/14/2015   PANNICULECTOMY N/A 01/14/2015   Procedure:  TOTAL PANNICULECTOMY WTH LIPOSUCTION OF SIDES;  Surgeon: Cristine Polio, MD;  Location: [place];  Service: Plastics;  Laterality: N/A;   POLYPECTOMY  02/26/2021   Procedure: POLYPECTOMY INTESTINAL;  Surgeon: Harvel Quale, MD;  [facility];  Service: Gastroenterology;;   TONSILLECTOMY     TOTAL HIP ARTHROPLASTY Left 12/16/2021   Procedure: TOTAL HIP ARTHROPLASTY ANTERIOR APPROACH;  Surgeon: Renette Butters, MD;  Location: [facility];  Service: Orthopedics;  Laterality: Left;   VENTRAL HERNIA REPAIR  01/14/2015   VENTRAL HERNIA REPAIR N/A 01/14/2015   Procedure: HERNIA REPAIR VENTRAL ADULT AND MUSCLE REPAIR;  Surgeon: Cristine Polio, MD;  Location: [place];  Service: Plastics;  Laterality: N/A;       Home Medications    Prior to Admission medications   Medication Sig Start Date End Date Taking? Authorizing Provider  diclofenac Sodium (VOLTAREN) 1 % GEL Apply 2 g topically 4 (four) times daily. 01/20/22  Yes Eulogio Bear, NP  tiZANidine (ZANAFLEX) 4 MG tablet Take 1 tablet (4 mg total) by mouth every 8 (eight) hours as needed for  muscle spasms. Do not take with other sedation medication, with alcohol or while driving or operating heavy machinery 01/20/22  Yes Eulogio Bear, NP  acetaminophen (TYLENOL) 500 MG tablet Take 2 tablets (1,000 mg total) by mouth every 6 (six) hours as needed for mild pain or moderate pain. 12/17/21   Britt Bottom, PA-C  atorvastatin (LIPITOR) 40 MG tablet Take 1 tablet (40 mg total) by mouth daily. 01/14/21   [name], NP  blood glucose meter kit and supplies KIT Test daily Dx  r73.03 06/26/19   [name], DO  Blood Pressure Monitor KIT 1 each by Does not apply route daily. 01/08/21   [name], NP  FARXIGA 5 MG TABS tablet Take 5 mg by mouth every morning. 09/09/21   [provider]  ferrous sulfate 325 (65 FE) MG tablet Take 65 mg by mouth  every 3 (three) days.    [provider]  gabapentin (NEURONTIN) 300 MG capsule TAKE (1) CAPSULE BY MOUTH AT BEDTIME. 01/02/22   Paseda, Folashade R, FNP  glucose blood test strip Use as instructed. Can check up to 4 times daily. 01/13/21   [name], NP  KERENDIA 10 MG TABS Take 10 mg by mouth daily. 11/13/21   [provider]  latanoprost (XALATAN) 0.005 % ophthalmic solution Place 1 drop into both eyes at bedtime.    [provider]  losartan (COZAAR) 25 MG tablet Take 1 tablet (25 mg total) by mouth daily. 04/14/21 04/14/22  [name], MD  Multiple Vitamin (MULTIVITAMIN) tablet Take 1 tablet by mouth daily. One a day    [provider]  NARCAN 4 MG/0.1ML LIQD nasal spray kit Place 0.4 mg into the nose once. 01/18/19   [provider]  ondansetron (ZOFRAN-ODT) 4 MG disintegrating tablet Take 1 tablet (4 mg total) by mouth 2 (two) times daily as needed for nausea or vomiting. 12/17/21   Britt Bottom, PA-C  Oxycodone HCl 20 MG TABS Take 20 mg by mouth 4 (four) times daily as needed (pain). 11/23/19   [provider]  polyethylene glycol (MIRALAX) 17 g packet Take 17 g by  mouth daily. to prevent constipation 12/17/21   Britt Bottom, PA-C  rivaroxaban (XARELTO) 10 MG TABS tablet Take 1 tablet (10 mg total) by mouth daily. to prevent blood clots after surgery. YOU MUST TAKE THIS MEDICINE! 12/17/21   Britt Bottom, PA-C  Semaglutide, 2 MG/DOSE, 8 MG/3ML SOPN Inject 2 mg as directed once a week. Patient taking differently: Inject 2 mg as directed every Wednesday. 05/13/21   [name], NP  sildenafil (VIAGRA) 50 MG tablet Take 1 tablet (50 mg total) by mouth daily as needed for erectile dysfunction. 01/19/22   Paseda, [name], FNP  timolol (BETIMOL) 0.5 % ophthalmic solution Place 1 drop into both eyes in the morning.    [provider]  vitamin B-12 (CYANOCOBALAMIN) 1000 MCG tablet Take 1,000 mcg by mouth daily.    [provider]    Family History Family History  Problem Relation Age of Onset   COPD Mother    Depression Mother    Hyperlipidemia Mother    Hypertension Mother    Heart disease Mother    Thyroid disease Mother    Anxiety disorder Mother    Diabetes Father    Pulmonary embolism Father 57       blood clot   Hyperlipidemia Father    Sudden death Father    Obesity Father    Eating disorder Father    Hypertension Brother     Social History Social History   Tobacco Use   Smoking status: Never   Smokeless tobacco: Never  Vaping Use   Vaping Use: Never used  Substance Use Topics   Alcohol use: No   Drug use: No     Allergies   Patient has no known allergies.   Review of Systems Review of Systems Per HPI  Physical Exam Triage Vital Signs ED Triage Vitals  Enc Vitals Group     BP 01/20/22 1448 110/75     Pulse Rate 01/20/22 1448 92     Resp 01/20/22 1448 18     Temp 01/20/22 1448 98 F (36.7 C)     Temp Source 01/20/22 1448 Oral     SpO2 01/20/22 1448 98 %  Weight --      Height --      Head Circumference --      Peak Flow --      Pain Score 01/20/22 1450 5     Pain Loc --      Pain  Edu? --      Excl. in [place]? --    No data found.  Updated Vital Signs BP 110/75 (BP Location: Right Arm)   Pulse 92   Temp 98 F (36.7 C) (Oral)   Resp 18   SpO2 98%   Visual Acuity Right Eye Distance:   Left Eye Distance:   Bilateral Distance:    Right Eye Near:   Left Eye Near:    Bilateral Near:     Physical Exam Vitals and nursing note reviewed.  Constitutional:      General: He is not in acute distress.    Appearance: Normal appearance. He is obese. He is not toxic-appearing.  HENT:     Head: Normocephalic and atraumatic.     Mouth/Throat:     Mouth: Mucous membranes are moist.     Pharynx: Oropharynx is clear.  Eyes:     General: No scleral icterus.    Extraocular Movements: Extraocular movements intact.  Pulmonary:     Effort: Pulmonary effort is normal. No respiratory distress.  Musculoskeletal:     Right hip: Tenderness present. No deformity or bony tenderness. Normal range of motion. Normal strength.     Right upper leg: Tenderness present. No swelling, edema or bony tenderness.     Right lower leg: No edema.     Left lower leg: No edema.       Legs:     Comments: Inspection: No obvious swelling, deformity, or redness to the right lower extremity Palpation: Right thigh tender to palpation approximately area marked; no obvious deformities palpated ROM: Full ROM to right lower extremity, although it is painful Strength: 5/5 lower extremity Neurovascular: neurovascularly intact in left and right lower extremity   Skin:    General: Skin is warm and dry.     Capillary Refill: Capillary refill takes less than 2 seconds.     Coloration: Skin is not jaundiced or pale.     Findings: No erythema or rash.  Neurological:     Mental Status: He is alert and oriented to person, place, and time.  Psychiatric:        Behavior: Behavior is cooperative.      UC Treatments / Results  Labs (all labs ordered are listed, but only abnormal results are displayed) Labs  Reviewed - No data to display  EKG   Radiology No results found.  Procedures Procedures (including critical care time)  Medications Ordered in UC Medications - No data to display  Initial Impression / Assessment and Plan / UC Course  I have reviewed the triage vital signs and the nursing notes.  Pertinent labs & imaging results that were available during my care of the patient were reviewed by me and considered in my medical decision making (see chart for details).    Patient is a pleasant, well-appearing 51 year old male presenting for right hip/thigh pain.  In triage, he is normotensive, not tachycardic, not tachypneic, afebrile, and oxygenating well on room air.  Right hip/thigh pain appears to be consistent with muscular pain.  We will treat with Voltaren gel, tizanidine every 8 hours as needed.  Educated to stop methocarbamol while he is taking the tizanidine.  Recommended heat/ice, light  stretching.  Recommended follow-up with primary care provider or orthopedic provider if symptoms persist or worsen despite treatment.  ER precautions discussed.  The patient was given the opportunity to ask questions.  All questions answered to their satisfaction.  The patient is in agreement to this plan.   Final Clinical Impressions(s) / UC Diagnoses   Final diagnoses:  Right hip pain     Discharge Instructions      - You can use Voltaren gel on the outside of your leg to help with the pain -Can also use tizanidine every 8 hours as needed for muscle spasms.  Do not take this with other medications that are sedating or with alcohol or while driving or operating heavy machinery.  Please stop the methocarbamol while on the tizanidine. -Follow-up with orthopedic provider if your symptoms persist despite treatment    ED Prescriptions     Medication Sig Dispense Auth. Provider   diclofenac Sodium (VOLTAREN) 1 % GEL Apply 2 g topically 4 (four) times daily. 2 g Noemi Chapel A, NP    tiZANidine (ZANAFLEX) 4 MG tablet Take 1 tablet (4 mg total) by mouth every 8 (eight) hours as needed for muscle spasms. Do not take with other sedation medication, with alcohol or while driving or operating heavy machinery 30 tablet Eulogio Bear, NP      I have reviewed the PDMP during this encounter.   Eulogio Bear, NP 01/20/22 [phone]

## 2022-01-21 ENCOUNTER — Encounter (HOSPITAL_COMMUNITY): Payer: Medicaid Other | Admitting: Physical Therapy

## 2022-01-21 DIAGNOSIS — G4733 Obstructive sleep apnea (adult) (pediatric): Secondary | ICD-10-CM | POA: Diagnosis not present

## 2022-01-27 ENCOUNTER — Encounter (HOSPITAL_COMMUNITY): Payer: Medicaid Other | Admitting: Physical Therapy

## 2022-01-28 DIAGNOSIS — M25551 Pain in right hip: Secondary | ICD-10-CM | POA: Diagnosis not present

## 2022-01-29 ENCOUNTER — Encounter (HOSPITAL_COMMUNITY): Payer: Medicaid Other | Admitting: Physical Therapy

## 2022-02-03 DIAGNOSIS — M5136 Other intervertebral disc degeneration, lumbar region: Secondary | ICD-10-CM | POA: Diagnosis not present

## 2022-02-09 ENCOUNTER — Other Ambulatory Visit: Payer: Self-pay | Admitting: Obstetrics and Gynecology

## 2022-02-09 NOTE — Patient Outreach (Signed)
Medicaid Managed Care   Nurse Care Manager Note  02/09/2022 Name:  [name] MRN:  [id number] DOB:  [birth year]  [name] is an 51 y.o. year old male who is a primary patient of [name] Rival, FNP.  The [facility] Managed Care Coordination team was consulted for assistance with:    Chronic healthcare manageent needs, HTN, DM, chroinic pain, CKD, osteoarthritis, CKD, HLD, OSA, GERD  [name] was given information about Medicaid Managed Care Coordination team services today. [name] Patient agreed to services and verbal consent obtained.  Engaged with patient by telephone for follow up visit in response to provider referral for case management and/or care coordination services.   Assessments/Interventions:  Review of past medical history, allergies, medications, health status, including review of consultants reports, laboratory and other test data, was performed as part of comprehensive evaluation and provision of chronic care management services.  SDOH (Social Determinants of Health) assessments and interventions performed: SDOH Interventions    Flowsheet Row Patient Outreach Telephone from 02/09/2022 in [place] Patient Outreach Telephone from 12/30/2021 in [place] Patient Outreach Telephone from 11/21/2021 in [place] Patient Outreach Telephone from 10/21/2021 in [place] Patient Outreach Telephone from 01/07/2021 in [place] Interventions       Food Insecurity Interventions Intervention Not Indicated -- Intervention Not Indicated -- Intervention Not Indicated  Housing Interventions -- -- -- -- Intervention Not Indicated  [patient states he has applied for Section 8 housing]  Transportation Interventions -- -- -- -- Intervention Not Indicated  Utilities  Interventions Intervention Not Indicated -- -- -- --  Financial Strain Interventions -- Intervention Not Indicated -- Intervention Not Indicated Intervention Not Indicated  Physical Activity Interventions -- -- -- -- Intervention Not Indicated  Stress Interventions -- -- -- -- Intervention Not Indicated      Care Plan  No Known Allergies  Medications Reviewed Today     Reviewed by [name] Medicus, RN (Registered Nurse) on 02/09/22 at [phone]  Med List Status: <None>   Medication Order Taking? Sig Documenting Provider Last Dose Status Informant  acetaminophen (TYLENOL) 500 MG tablet [id number]  Take 2 tablets (1,000 mg total) by mouth every 6 (six) hours as needed for mild pain or moderate pain. [name] Moats M, PA-C  Active   atorvastatin (LIPITOR) 40 MG tablet [id number] No Take 1 tablet (40 mg total) by mouth daily.  Patient not taking: Reported on 02/09/2022   [name] Larsson, NP Not Taking Active Self           Med Note ([place] Aug 19, 2021  9:00 AM)    blood glucose meter kit and supplies KIT [id number]  Test daily Dx  r73.03 [name] Deutscher, DO  Active Self  Blood Pressure Monitor KIT [id number]  1 each by Does not apply route daily. [name] Larsson, NP  Active Self  diclofenac Sodium (VOLTAREN) 1 % GEL [id number]  Apply 2 g topically 4 (four) times daily. [name] Bear, NP  Active   FARXIGA 5 MG TABS tablet [id number]  Take 5 mg by mouth every morning. [provider]  Active Self  ferrous sulfate 325 (65 FE) MG tablet [id number]  Take 65 mg by mouth every 3 (three) days. [provider]  Active   gabapentin (NEURONTIN) 300 MG capsule [id number]  TAKE (1) CAPSULE  BY MOUTH AT BEDTIME. [name] Rival, FNP  Active   glucose blood test strip [id number]  Use as instructed. Can check up to 4 times daily. [name] Larsson, NP  Active Self  KERENDIA 10 MG TABS [id number]  Take 10 mg by mouth daily. [provider]  Active Self  latanoprost (XALATAN)  0.005 % ophthalmic solution [id number]  Place 1 drop into both eyes at bedtime. [provider]  Active Self  losartan (COZAAR) 25 MG tablet [id number]  Take 1 tablet (25 mg total) by mouth daily. [name] Spar, MD  Active Self  Multiple Vitamin (MULTIVITAMIN) tablet [id number]  Take 1 tablet by mouth daily. One a day [provider]  Active Self  NARCAN 4 MG/0.1ML LIQD nasal spray kit [id number]  Place 0.4 mg into the nose once. [provider]  Active Self           Med Note ([place]   Tue Aug 19, 2021  9:06 AM)    ondansetron (ZOFRAN-ODT) 4 MG disintegrating tablet [id number]  Take 1 tablet (4 mg total) by mouth 2 (two) times daily as needed for nausea or vomiting. [name] Moats M, PA-C  Active   Oxycodone HCl 20 MG TABS [id number]  Take 20 mg by mouth 4 (four) times daily as needed (pain). [provider]  Active Self           Med Note [name]   Thu Nov 27, 2021 12:05 PM)    polyethylene glycol (MIRALAX) 17 g packet [id number]  Take 17 g by mouth daily. to prevent constipation [name] Moats M, PA-C  Active   rivaroxaban (XARELTO) 10 MG TABS tablet [id number]  Take 1 tablet (10 mg total) by mouth daily. to prevent blood clots after surgery. YOU MUST TAKE THIS MEDICINE! [name] Bottom, PA-C  Active   Semaglutide, 2 MG/DOSE, 8 MG/3ML SOPN [id number]  Inject 2 mg as directed once a week.  Patient taking differently: Inject 2 mg as directed every Wednesday.   [name] Larsson, NP  Active Self           Med Note [name], [name] R   Thu Nov 27, 2021 10:48 AM) Ozempic  sildenafil (VIAGRA) 50 MG tablet [id number]  Take 1 tablet (50 mg total) by mouth daily as needed for erectile dysfunction. [name] Rival, FNP  Active   timolol (BETIMOL) 0.5 % ophthalmic solution [id number]  Place 1 drop into both eyes in the morning. [provider]  Active Self  tiZANidine (ZANAFLEX) 4 MG tablet [id number]  Take 1 tablet (4 mg total) by mouth every 8  (eight) hours as needed for muscle spasms. Do not take with other sedation medication, with alcohol or while driving or operating heavy machinery [name] Bear, NP  Active   vitamin B-12 (CYANOCOBALAMIN) 1000 MCG tablet [id number]  Take 1,000 mcg by mouth daily. [provider]  Active Self           Patient Active Problem List   Diagnosis Date Noted   S/P total left hip arthroplasty 12/16/2021   Need for varicella vaccine 10/16/2021   OSA  09/11/2021   Pre-operative clearance 06/19/2021   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   Immunization due 02/06/2021   Iron deficiency anemia 12/30/2020   History of colonic polyps 12/30/2020   Unilateral primary osteoarthritis, right hip 08/13/2020   Unilateral primary osteoarthritis, left hip 08/13/2020   Muscle spasms of both  lower extremities 05/23/2020   Need for immunization against influenza 03/06/2020   Primary osteoarthritis of both hips 07/10/2019   Bilateral primary osteoarthritis of hip 07/10/2019   Vitamin D deficiency 04/13/2019   Morbid obesity with body mass index (BMI) of 40.0 to 44.9 in adult [facility]) 04/13/2019   Depressive disorder 04/13/2019   Body mass index (BMI) 45.0-49.9, adult ([place]) 04/13/2019   Kidney disease, chronic, stage III (GFR 30-59 ml/min) ([place]) 07/20/2017   History of cerebrovascular accident 07/20/2017   Gastroesophageal reflux disease 07/20/2017   Gastric bypass status for obesity 02/11/2017   Benzodiazepine dependence ([place]) 02/11/2017   Type 2 diabetes mellitus with diabetic nephropathy ([place]) 08/03/2008   Hyperlipidemia 08/03/2008   High blood pressure 08/03/2008   SLEEP APNEA 08/03/2008   Sleep apnea 08/03/2008   Conditions to be addressed/monitored per PCP order:  Chronic healthcare manageent needs, HTN, DM, chroinic pain, CKD, osteoarthritis, CKD, HLD, OSA, GERD  Care Plan : RN Care Manager Plan Of Care  Updates made by [name] Medicus,  RN since 02/09/2022 12:00 AM     Problem: Chronic Disease Management and Care Coordination Needs for DM, HTN, HLD, CKD      Long-Range Goal: Development of Plan Of Care For Chronic Disease Management and Care Coordination Needs to Assist With Meeting Treatment Goals for DM, CKD, HTN, obesity, chronic hip pain   Start Date: 01/07/2021  Expected End Date: 04/01/2022  Priority: High  Note:   Current Barriers:  Knowledge Deficits related to plan of care for management of HTN, HLD, CKD, and DMII, chronic pain, obesity Chronic Disease Management support and education needs related to HTN, HLD, and DMII, chronic pain 02/09/22:  Patient awaiting right hip replacement-to occur 03/03/22.  PT completed for left hip.  BP and BG stable-patient stopped Lipitor-to f/u with provider.    RNCM Clinical Goal(s):  Patient will verbalize understanding of plan for management of HTN, HLD, CKD and DMII take all medications exactly as prescribed and will call provider for medication related questions attend all scheduled medical appointments:  demonstrate ongoing adherence to prescribed treatment plan for HTN, HLD, CKD and DMII as evidenced by daily/ twice daily monitoring and recording of CBG,  adherence to ADA/ carb modified, fat modified, no added salt diet, continue 30-40 minutes of exercise on your NuStep 5-7 days/week, adherence to prescribed medication regimen including new diabetes medication,  contacting provider for new or worsened symptoms or questions related to HTN, DM or HLD, CKD continue to work with Consulting civil engineer and managed Medicaid pharmacist to address care management and care coordination needs related to HTN, HLD, and DMII , CKD  Interventions: Inter-disciplinary care team collaboration (see longitudinal plan of care) Evaluation of current treatment plan related to  self management and patient's adherence to plan as established by provider Reviewed appointments and insured patient has  transportation to all appointments   Chronic Kidney Disease (Status: Goal on Track (progressing): YES.) - all recent provider BP readings are meeting treatment targets, patient reports his nephrologist was pleased that his creatinine has stabilized and that his BP reading during office visit was good ,  patient states he checks his BP occasionally and that readings are usually <130/<80 Last practice recorded BP readings:    BP Readings from Last 3 Encounters:  08/01/21 107/76  06/26/21 110/76  06/19/21 116/76         Most recent eGFR/CrCl:  Lab Results  Component Value Date   NA 137 05/15/2021   K 4.1 05/15/2021  CREATININE 1.63 (H) 05/15/2021   EGFR 51 (L) 05/15/2021   GFRNONAA 37 (L) 01/10/2020   GLUCOSE 128 (H) 05/15/2021    Reviewed medications with patient and discussed importance of compliance   Assessed frequency of self monitored BP, reviewed home and provider office readings, reviewed treatment targets and reviewed lifestyle strategies   Reviewed addition of Farxiga to medication regimen for blood pressure, blood sugar and weight control in addition to slowing progression of CKD and lowering risks of heart attack, stroke and cardiac death , assessed tolerance of Farxiga and reviewed major adverse side effects   Reviewed scheduled/upcoming provider appointments  Discussed plans with patient for ongoing care management follow up and provided patient with direct contact information for care management team    Diabetes:  (Status: Goal on track: YES.)- patient reports self monitored fasting CBGs are usually <130, checks 1-2 times a day.  Most recent A1C=6.1 on 10/17/21. Lab Results  Component Value Date   HGBA1C 7.0 (H) 05/15/2021  Assessed frequency of CBG testing and reviewed fasting target Reviewed medications, including recent addition of Farxiga to medication regimen with patient and discussed importance of good medication taking behavior Discussed plans with patient for  ongoing care management follow up and provided patient with direct contact information for care management team;               Health Maintenance (Status: Condition stable. Not addressed this visit.) - patient completed colonoscopy and endoscopy on 02/26/21 and was aware of pathology report- non cancerous Patient interviewed about adult health maintenance status   Pneumonia Vaccine-  received pneumococcal  vaccine on 05/15/21 Influenza Vaccine- states he received flu shot on 02/06/21 COVID vaccination   - pt states he never received  Shingles Vaccination- received first dose on 02/06/21, did not receive 2nd vaccination so will message provider prior to his appointment on May 11,2023-second shingles vaccine received 10/17/21 Regular eye checkups- says he sees his eye doctor, Dr [name],  every 6 months due to glaucoma and his next appointment is in November  Advanced Directives- states he does not have and does not want information at this time  Dental Care- states his dentist is Dr Mina Marble in [place] and he sees her once-twice yearly   Hyperlipidemia:  (Status: Condition stable. Not addressed this visit. Lab Results  Component Value Date   CHOL 162 05/15/2021   HDL 48 05/15/2021   LDLCALC 88 05/15/2021   TRIG 147 05/15/2021   CHOLHDL 6.1 (H) 12/20/2019     Medication review performed; medication list updated in electronic medical record.   Hypertension: (Status: Goal on Track (progressing): YES.)-patient states he checks his BP daily and that readings are usually <130/<80, he says provider office readings are higher at times because he is in pain from the walk to the offices. 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 Last practice recorded BP readings:  BP Readings from Last 3 Encounters:  08/01/21 107/76  06/26/21 110/76  06/19/21 116/76    Most recent eGFR/CrCl:  Lab Results  Component Value Date   NA 137 05/15/2021   K 4.1 05/15/2021    CREATININE 1.63 (H) 05/15/2021   EGFR 51 (L) 05/15/2021   GFRNONAA 37 (L) 01/10/2020   GLUCOSE 128 (H) 05/15/2021   Evaluation of current treatment plan related to hypertension self management and patient's adherence to plan as established by provider;   Reviewed BP medications and assessed medication taking behavior ensuring he is taking  Losartan that was recently added to his regimen Assessed frequency of self monitored blood press and readings, reviewed treatment targets Discussed plans with patient for ongoing care management follow up and provided patient with direct contact information for care management team; Reviewed scheduled/upcoming provider appointments  Pain:  (Status: Goal on track: Yes. Medications reviewed Reviewed provider established plan for pain management; Counseled on the importance of reporting any/all new or changed pain symptoms or management strategies to pain management provider;  Weight Loss Interventions:  (Status:  New goal.) Long Term Goal Provided verbal and/or written education to patient re: provider recommended life style modifications  Reviewed recommended dietary changes: avoid fad diets, make small/incremental dietary and exercise changes, eat at the table and avoid eating in front of the TV, plan management of cravings, monitor snacking and cravings in food diary Congratulated patient on good medication taking behavior, ongoing weight loss  and 30-40 minutes of Nu Step home exercise each evening Reviewed [place] benefit related to assisting with weight loss Encouraged patient to reschedule with [name] for medical nutrition therapy  Patient Goals/Self-Care Activities: Take medications as prescribed   Attend all scheduled provider appointments Call pharmacy for medication refills 3-7 days in advance of running out of medications Perform all self care activities independently  Perform IADL's (shopping, preparing meals, housekeeping, managing  finances) independently Call provider office for new concerns or questions  Call and schedule dentist appointment with [name] for check up and cleaning as soon as possible , phone number [phone] Reschedule a follow up appointment with [name], dietician and [place], to help with ongoing weight loss   Long-Range Goal: Establish Plan of Care for Chronic Disease Management Needs   Priority: High  Note:   Timeframe:  Long-Range Goal Priority:  High Start Date:     10/21/21                        Expected End Date:  ongoing                     Follow Up Date: 03/13/22   - schedule appointment for vaccines needed due to my age or health - schedule recommended health tests (blood work, mammogram, colonoscopy, pap test) - schedule and keep appointment for annual check-up    Why is this important?   Screening tests can find diseases early when they are easier to treat.  Your doctor or nurse will talk with you about which tests are important for you.  Getting shots for common diseases like the flu and shingles will help prevent them.  02/09/22:  PT completed, to see Nephrology this month   Follow Up:  Patient agrees to Care Plan and Follow-up.  Plan: The Managed Medicaid care management team will reach out to the patient again over the next 30 business  days. and The  Patient has been provided with contact information for the Managed Medicaid care management team and has been advised to call with any health related questions or concerns.  Date/time of next scheduled RN care management/care coordination outreach: 03/13/22 at 0900.

## 2022-02-09 NOTE — Patient Instructions (Signed)
Hey [name], thanks for speaking with me-have a great day and week!!  [name] was given information about Medicaid Managed Care team care coordination services as a part of their Healthy [facility] Medicaid benefit. [name] verbally consented to engagement with the [facility] Managed Care team.   If you are experiencing a medical emergency, please call 911 or report to your local emergency department or urgent care.   If you have a non-emergency medical problem during routine business hours, please contact your provider's office and ask to speak with a nurse.   For questions related to your Healthy [facility] health plan, please call: [phone] or visit the homepage here: [email or web address]  If you would like to schedule transportation through your Healthy [facility] plan, please call the following number at least 2 days in advance of your appointment: [phone]  For information about your ride after you set it up, call Ride Assist at [phone]. Use this number to activate a Will Call pickup, or if your transportation is late for a scheduled pickup. Use this number, too, if you need to make a change or cancel a previously scheduled reservation.  If you need transportation services right away, call [phone]. The after-hours call center is staffed 24 hours to handle ride assistance and urgent reservation requests (including discharges) 365 days a year. Urgent trips include sick visits, hospital discharge requests and life-sustaining treatment.  Call the [facility] at [phone], at any time, 24 hours a day, 7 days a week. If you are in danger or need immediate medical attention call 911.  If you would like help to quit smoking, call 1-800-QUIT-NOW (734)569-2000) OR Espaol: 1-855-Djelo-Ya (3-329-518-8416) o para ms informacin haga clic aqu or Text READY to 200-400 to register via text  [name] - following  are the goals we discussed in your visit today:   Goals Addressed    Timeframe:  Long-Range Goal Priority:  High Start Date:     10/21/21                        Expected End Date:  ongoing                     Follow Up Date: 03/13/22   - schedule appointment for vaccines needed due to my age or health - schedule recommended health tests (blood work, mammogram, colonoscopy, pap test) - schedule and keep appointment for annual check-up    Why is this important?   Screening tests can find diseases early when they are easier to treat.  Your doctor or nurse will talk with you about which tests are important for you.  Getting shots for common diseases like the flu and shingles will help prevent them.  02/09/22:  PT completed, to see Nephrology this month.  Patient verbalizes understanding of instructions and care plan provided today and agrees to view in [place]. Active MyChart status and patient understanding of how to access instructions and care plan via MyChart confirmed with patient.     The Managed Medicaid care management team will reach out to the patient again over the next 30 business  days.  The  Patient  has been provided with contact information for the Managed Medicaid care management team and has been advised to call with any health related questions or concerns.   [name] RN, BSN [place]  [facility] [employer] - Managed Medicaid High Risk [phone]  Following is a copy of your plan of care:  Care Plan : [place]  Updates made by Gayla Medicus, RN since 02/09/2022 12:00 AM     Problem: Chronic Disease Management and Care Coordination Needs for DM, HTN, HLD, CKD      Long-Range Goal: Development of Plan Of Care For Chronic Disease Management and Care Coordination Needs to Assist With Meeting Treatment Goals for DM, CKD, HTN, obesity, chronic hip pain   Start Date: 01/07/2021  Expected End Date: 04/01/2022   Priority: High  Note:   Current Barriers:  Knowledge Deficits related to plan of care for management of HTN, HLD, CKD, and DMII, chronic pain, obesity Chronic Disease Management support and education needs related to HTN, HLD, and DMII, chronic pain 02/09/22:  Patient awaiting right hip replacement-to occur 03/03/22.  PT completed for left hip.  BP and BG stable-patient stopped Lipitor-to f/u with provider.    RNCM Clinical Goal(s):  Patient will verbalize understanding of plan for management of HTN, HLD, CKD and DMII take all medications exactly as prescribed and will call provider for medication related questions attend all scheduled medical appointments:  demonstrate ongoing adherence to prescribed treatment plan for HTN, HLD, CKD and DMII as evidenced by daily/ twice daily monitoring and recording of CBG,  adherence to ADA/ carb modified, fat modified, no added salt diet, continue 30-40 minutes of exercise on your NuStep 5-7 days/week, adherence to prescribed medication regimen including new diabetes medication,  contacting provider for new or worsened symptoms or questions related to HTN, DM or HLD, CKD continue to work with Consulting civil engineer and managed Medicaid pharmacist to address care management and care coordination needs related to HTN, HLD, and DMII , CKD  Interventions: Inter-disciplinary care team collaboration (see longitudinal plan of care) Evaluation of current treatment plan related to  self management and patient's adherence to plan as established by provider Reviewed appointments and insured patient has transportation to all appointments   Chronic Kidney Disease (Status: Goal on Track (progressing): YES.) - all recent provider BP readings are meeting treatment targets, patient reports his nephrologist was pleased that his creatinine has stabilized and that his BP reading during office visit was good ,  patient states he checks his BP occasionally and that readings are usually  <130/<80 Last practice recorded BP readings:    BP Readings from Last 3 Encounters:  08/01/21 107/76  06/26/21 110/76  06/19/21 116/76         Most recent eGFR/CrCl:  Lab Results  Component Value Date   NA 137 05/15/2021   K 4.1 05/15/2021   CREATININE 1.63 (H) 05/15/2021   EGFR 51 (L) 05/15/2021   GFRNONAA 37 (L) 01/10/2020   GLUCOSE 128 (H) 05/15/2021    Reviewed medications with patient and discussed importance of compliance   Assessed frequency of self monitored BP, reviewed home and provider office readings, reviewed treatment targets and reviewed lifestyle strategies   Reviewed addition of Farxiga to medication regimen for blood pressure, blood sugar and weight control in addition to slowing progression of CKD and lowering risks of heart attack, stroke and cardiac death , assessed tolerance of Farxiga and reviewed major adverse side effects   Reviewed scheduled/upcoming provider appointments  Discussed plans with patient for ongoing care management follow up and provided patient with direct contact information for care management team    Diabetes:  (Status: Goal on track: YES.)- patient reports self monitored fasting CBGs are usually <130, checks  1-2 times a day.  Most recent A1C=6.1 on 10/17/21. Lab Results  Component Value Date   HGBA1C 7.0 (H) 05/15/2021  Assessed frequency of CBG testing and reviewed fasting target Reviewed medications, including recent addition of Farxiga to medication regimen with patient and discussed importance of good medication taking behavior Discussed plans with patient for ongoing care management follow up and provided patient with direct contact information for care management team;               Health Maintenance (Status: Condition stable. Not addressed this visit.) - patient completed colonoscopy and endoscopy on 02/26/21 and was aware of pathology report- non cancerous Patient interviewed about adult health maintenance status   Pneumonia  Vaccine-  received pneumococcal  vaccine on 05/15/21 Influenza Vaccine- states he received flu shot on 02/06/21 COVID vaccination   - pt states he never received  Shingles Vaccination- received first dose on 02/06/21, did not receive 2nd vaccination so will message provider prior to his appointment on May 11,2023-second shingles vaccine received 10/17/21 Regular eye checkups- says he sees his eye doctor, Dr [name],  every 6 months due to glaucoma and his next appointment is in November  Advanced Directives- states he does not have and does not want information at this time  Dental Care- states his dentist is Dr Mina Marble in [place] and he sees her once-twice yearly   Hyperlipidemia:  (Status: Condition stable. Not addressed this visit. Lab Results  Component Value Date   CHOL 162 05/15/2021   HDL 48 05/15/2021   LDLCALC 88 05/15/2021   TRIG 147 05/15/2021   CHOLHDL 6.1 (H) 12/20/2019     Medication review performed; medication list updated in electronic medical record.   Hypertension: (Status: Goal on Track (progressing): YES.)-patient states he checks his BP daily and that readings are usually <130/<80, he says provider office readings are higher at times because he is in pain from the walk to the offices. 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 Last practice recorded BP readings:  BP Readings from Last 3 Encounters:  08/01/21 107/76  06/26/21 110/76  06/19/21 116/76    Most recent eGFR/CrCl:  Lab Results  Component Value Date   NA 137 05/15/2021   K 4.1 05/15/2021   CREATININE 1.63 (H) 05/15/2021   EGFR 51 (L) 05/15/2021   GFRNONAA 37 (L) 01/10/2020   GLUCOSE 128 (H) 05/15/2021   Evaluation of current treatment plan related to hypertension self management and patient's adherence to plan as established by provider;   Reviewed BP medications and assessed medication taking behavior ensuring he is taking Losartan that was recently  added to his regimen Assessed frequency of self monitored blood press and readings, reviewed treatment targets Discussed plans with patient for ongoing care management follow up and provided patient with direct contact information for care management team; Reviewed scheduled/upcoming provider appointments  Pain:  (Status: Goal on track: Yes. Medications reviewed Reviewed provider established plan for pain management; Counseled on the importance of reporting any/all new or changed pain symptoms or management strategies to pain management provider;  Weight Loss Interventions:  (Status:  New goal.) Long Term Goal Provided verbal and/or written education to patient re: provider recommended life style modifications  Reviewed recommended dietary changes: avoid fad diets, make small/incremental dietary and exercise changes, eat at the table and avoid eating in front of the TV, plan management of cravings, monitor snacking and cravings in food diary Congratulated patient on good medication  taking behavior, ongoing weight loss  and 30-40 minutes of Nu Step home exercise each evening Reviewed [place] benefit related to assisting with weight loss Encouraged patient to reschedule with [name] for medical nutrition therapy  Patient Goals/Self-Care Activities: Take medications as prescribed   Attend all scheduled provider appointments Call pharmacy for medication refills 3-7 days in advance of running out of medications Perform all self care activities independently  Perform IADL's (shopping, preparing meals, housekeeping, managing finances) independently Call provider office for new concerns or questions  Call and schedule dentist appointment with [name] for check up and cleaning as soon as possible , phone number [phone] Reschedule a follow up appointment with [name], dietician and CDCES, to help with ongoing weight loss

## 2022-02-11 DIAGNOSIS — M25551 Pain in right hip: Secondary | ICD-10-CM | POA: Diagnosis not present

## 2022-02-11 NOTE — Progress Notes (Signed)
Sent message, via epic in basket, requesting order in epic from surgeon     02/11/22 1503  Preop Orders  Has preop orders? No  Name of staff/physician contacted for orders(Indicate phone or IB message) M. Gawne, PA-C.

## 2022-02-11 NOTE — H&P (Signed)
HIP ARTHROPLASTY ADMISSION H&P  Patient ID: JAIEL SARACENO MRN: [id number] DOB/AGE: Sep 23, 1970 51 y.o.  Chief Complaint: right hip pain.  Planned Procedure Date: 03/03/22 Medical Clearance by Demetrius Revel NP   Cardiac Clearance by Dr. Fransico Him Nephrology clearance by Dr. Theador Hawthorne PM&R  clearance by Dr. Everlean Patterson    HPI: GOTTLIEB ZUERCHER is a 51 y.o. male who presents for evaluation of OA RIGHT HIP. The patient has a history of pain and functional disability in the right hip due to arthritis and has failed non-surgical conservative treatments for greater than 12 weeks to include NSAID's and/or analgesics, corticosteriod injections, supervised PT with diminished ADL's post treatment, use of assistive devices, and activity modification.  Onset of symptoms was gradual, starting 4 years ago with gradually worsening course since that time. The patient noted no past surgery on the right hip.  Patient currently rates pain at 6 out of 10 with activity. Patient has night pain, worsening of pain with activity and weight bearing, and pain that interferes with activities of daily living.  Patient has evidence of subchondral cysts, subchondral sclerosis, periarticular osteophytes, joint space narrowing, and osteolysis  by imaging studies.  There is no active infection.  Past Medical History:  Diagnosis Date   ALLERGY 08/03/2008   Qualifier: Diagnosis of  By: Christ Kick     Anemia    Arthritis    "hips" (01/15/2015)   Blood transfusion without reported diagnosis    Cellulitis 07/25/2018   Chronic back pain greater than 3 months duration 02/11/2017   Chronic lower back pain    Colon polyps    COLONIC POLYPS, ADENOMATOUS 08/03/2008   Qualifier: Diagnosis of  By: Christ Kick     CVA 08/03/2008   Qualifier: History of  By: Christ Kick     Depression    denies   Diabetes mellitus    diet controlled- no meds since wt loss   Diabetes mellitus ([place]) 07/10/2019   Dysrhythmia    s/p  surgery bariatric- cardioversion   Erectile dysfunction 01/26/2018   GERD (gastroesophageal reflux disease)    HIATAL HERNIA 08/03/2008   Qualifier: Diagnosis of  By: Christ Kick     Hip pain    History of gout    Hyperlipidemia    Hypertension    Joint pain    Narcotic dependence ([place]) 02/11/2017   Neuromuscular disorder ([place])    PONV (postoperative nausea and vomiting)    Prediabetes 03/29/2017   Sleep apnea    no longer have to use cpap since wt loss   Stage 3 chronic kidney disease ([place]) 01/26/2018   Stroke ([place]) 2006   denies residual on 01/15/2015   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 ([place]) 08/01/2018   Past Surgical History:  Procedure Laterality Date   BARIATRIC SURGERY  2010   in [place]  02/26/2021   Procedure: BIOPSY;  Surgeon: Harvel Quale, MD;  [facility];  Service: Gastroenterology;;   CARDIOVERSION     COLONOSCOPY N/A 08/02/2014   Procedure: COLONOSCOPY;  Surgeon: Rogene Houston, MD;  [facility];  Service: Endoscopy;  Laterality: N/A;  210 - moved to 3/10 @ 11:55 - Ann notified pt   COLONOSCOPY WITH PROPOFOL N/A 02/26/2021   Procedure: COLONOSCOPY WITH PROPOFOL;  Surgeon: Harvel Quale, MD;  [facility];  Service: Gastroenterology;  Laterality: N/A;  10:00   ESOPHAGOGASTRODUODENOSCOPY     ESOPHAGOGASTRODUODENOSCOPY (EGD) WITH PROPOFOL N/A 02/26/2021  Procedure: ESOPHAGOGASTRODUODENOSCOPY (EGD) WITH PROPOFOL;  Surgeon: Harvel Quale, MD;  [facility];  Service: Gastroenterology;  Laterality: N/A;   HERNIA REPAIR     LIPOSUCTION     PANNICULECTOMY  01/14/2015   PANNICULECTOMY N/A 01/14/2015   Procedure:  TOTAL PANNICULECTOMY WTH LIPOSUCTION OF SIDES;  Surgeon: Cristine Polio, MD;  Location: [facility];  Service: Plastics;  Laterality: N/A;   POLYPECTOMY  02/26/2021   Procedure: POLYPECTOMY INTESTINAL;  Surgeon: Harvel Quale, MD;  [facility];  Service: Gastroenterology;;   TONSILLECTOMY     TOTAL HIP ARTHROPLASTY Left 12/16/2021   Procedure: TOTAL HIP ARTHROPLASTY ANTERIOR APPROACH;  Surgeon: Renette Butters, MD;  Location: [facility];  Service: Orthopedics;  Laterality: Left;   VENTRAL HERNIA REPAIR  01/14/2015   VENTRAL HERNIA REPAIR N/A 01/14/2015   Procedure: HERNIA REPAIR VENTRAL ADULT AND MUSCLE REPAIR;  Surgeon: Cristine Polio, MD;  Location: [facility];  Service: Plastics;  Laterality: N/A;   No Known Allergies Prior to Admission medications   Medication Sig Start Date End Date Taking? Authorizing Provider  acetaminophen (TYLENOL) 500 MG tablet Take 2 tablets (1,000 mg total) by mouth every 6 (six) hours as needed for mild pain or moderate pain. 12/17/21   Britt Bottom, PA-C  atorvastatin (LIPITOR) 40 MG tablet Take 1 tablet (40 mg total) by mouth daily. 01/14/21   [name], NP  blood glucose meter kit and supplies KIT Test daily Dx  r73.03 06/26/19   [name], DO  Blood Pressure Monitor KIT 1 each by Does not apply route daily. 01/08/21   [name], NP  diclofenac Sodium (VOLTAREN) 1 % GEL Apply 2 g topically 4 (four) times daily. 01/20/22   Eulogio Bear, NP  FARXIGA 5 MG TABS tablet Take 5 mg by mouth every morning. 09/09/21   [provider]  ferrous sulfate 325 (65 FE) MG tablet Take 65 mg by mouth every 3 (three) days.    [provider]  gabapentin (NEURONTIN) 300 MG capsule TAKE (1) CAPSULE BY MOUTH AT BEDTIME. 01/02/22   Paseda, Folashade R, FNP  glucose blood test strip Use as instructed. Can check up to 4 times daily. 01/13/21   [name], NP  KERENDIA 10 MG TABS Take 10 mg by mouth daily. 11/13/21   [provider]  latanoprost (XALATAN) 0.005 % ophthalmic solution Place 1 drop into both eyes at bedtime.    [provider]  losartan (COZAAR) 25 MG tablet Take 1 tablet (25 mg total) by mouth daily. 04/14/21 04/14/22  [name], MD  Multiple Vitamin  (MULTIVITAMIN) tablet Take 1 tablet by mouth daily. One a day    [provider]  NARCAN 4 MG/0.1ML LIQD nasal spray kit Place 0.4 mg into the nose once. 01/18/19   [provider]  ondansetron (ZOFRAN-ODT) 4 MG disintegrating tablet Take 1 tablet (4 mg total) by mouth 2 (two) times daily as needed for nausea or vomiting. 12/17/21   Britt Bottom, PA-C  Oxycodone HCl 20 MG TABS Take 20 mg by mouth 4 (four) times daily as needed (pain). 11/23/19   [provider]  polyethylene glycol (MIRALAX) 17 g packet Take 17 g by mouth daily. to prevent constipation 12/17/21   Britt Bottom, PA-C  rivaroxaban (XARELTO) 10 MG TABS tablet Take 1 tablet (10 mg total) by mouth daily. to prevent blood clots after surgery. YOU MUST TAKE THIS MEDICINE! 12/17/21   Madelon Lips,  Julliana Whitmyer M, PA-C  Semaglutide, 2 MG/DOSE, 8 MG/3ML SOPN Inject 2 mg as directed once a week. Patient taking differently: Inject 2 mg as directed every Wednesday. 05/13/21   [name], NP  sildenafil (VIAGRA) 50 MG tablet Take 1 tablet (50 mg total) by mouth daily as needed for erectile dysfunction. 01/19/22   Paseda, [name], FNP  timolol (BETIMOL) 0.5 % ophthalmic solution Place 1 drop into both eyes in the morning.    [provider]  tiZANidine (ZANAFLEX) 4 MG tablet Take 1 tablet (4 mg total) by mouth every 8 (eight) hours as needed for muscle spasms. Do not take with other sedation medication, with alcohol or while driving or operating heavy machinery 01/20/22   Eulogio Bear, NP  vitamin B-12 (CYANOCOBALAMIN) 1000 MCG tablet Take 1,000 mcg by mouth daily.    [provider]   Social History   Socioeconomic History   Marital status: Single    Spouse name: Not on file   Number of children: 0   Years of education: 12   Highest education level: Not on file  Occupational History   Not on file  Tobacco Use   Smoking status: Never   Smokeless tobacco: Never  Vaping Use   Vaping Use: Never  used  Substance and Sexual Activity   Alcohol use: No   Drug use: No   Sexual activity: Yes    Birth control/protection: Condom  Other Topics Concern   Not on file  Social History Narrative   Lives alone   Cat-Mr Schlater      Enjoy races, eat out, watch movies, listen music, reads some      Diet: Snacks a lot, tried to avoid red meat, does eat chicken and [place], eats a lot of carbs and breads (chips), and sweets   Caffeine: drinks a can monster daily   Water: 3-4 bottles 16.9 oz      Wear seat belt   Wear sunscreen   Smoke and carbon monoxide detectors    Reports not using phone when driving   Social Determinants of Health   Financial Resource Strain: Low Risk  (12/30/2021)   Overall Financial Resource Strain (CARDIA)    Difficulty of Paying Living Expenses: Not hard at all  Food Insecurity: No Food Insecurity (02/09/2022)   Hunger Vital Sign    Worried About Running Out of Food in the Last Year: Never true    [place] in the Last Year: Never true  Transportation Needs: No Transportation Needs (01/07/2021)   PRAPARE - Hydrologist (Medical): No    Lack of Transportation (Non-Medical): No  Physical Activity: Sufficiently Active (01/07/2021)   Exercise Vital Sign    Days of Exercise per Week: 7 days    Minutes of Exercise per Session: 40 min  Stress: No Stress Concern Present (01/07/2021)   [place]    Feeling of Stress : Only a little  Social Connections: Unknown (01/07/2021)   Social Connection and Isolation Panel [NHANES]    Frequency of Communication with Friends and Family: Not on file    Frequency of Social Gatherings with Friends and Family: Not on file    Attends Religious Services: Never    Marine scientist or Organizations: No    Attends Archivist Meetings: Never    Marital Status: Not on file   Family History  Problem Relation Age of Onset  COPD Mother    Depression Mother    Hyperlipidemia Mother    Hypertension Mother    Heart disease Mother    Thyroid disease Mother    Anxiety disorder Mother    Diabetes Father    Pulmonary embolism Father 17       blood clot   Hyperlipidemia Father    Sudden death Father    Obesity Father    Eating disorder Father    Hypertension Brother     ROS: Currently denies lightheadedness, dizziness, Fever, chills, CP, SOB.   No personal history of DVT, PE, or MI. + h/o CVA 2006 and 2010 No loose teeth or dentures All other systems have been reviewed and were otherwise currently negative with the exception of those mentioned in the HPI and as above.  Objective: Vitals: Ht: 5'8" Wt: 259.3 lbs Temp: 97.6 BP: 118/79 Pulse: 77 O2 97% on room air.   Physical Exam: General: Alert, NAD. Trendelenberg Gait  HEENT: EOMI, Good Neck Extension  Pulm: No increased work of breathing.  Clear B/L A/P w/o crackle or wheeze.  CV: RRR, No m/g/r appreciated  GI: soft, NT, ND. BS x 4 quadrants Neuro: CN II-XII grossly intact without focal deficit.  Sensation intact distally Skin: No lesions in the area of chief complaint MSK/Surgical Site: + TTP. Hip pain with ROM. + [place]. + SLR. + FABER/FADIR. Decreased strength.  NVI.    Imaging Review Plain radiographs demonstrate severe degenerative joint disease of the right hip.   The bone quality appears to be poor for age and reported activity level.  Preoperative templating of the joint replacement has been completed, documented, and submitted to the Operating Room personnel in order to optimize intra-operative equipment management.  Assessment: OA RIGHT HIP Active Problems:   * No active hospital problems. *   Plan: Plan for Procedure(s): TOTAL HIP ARTHROPLASTY ANTERIOR APPROACH  The patient history, physical exam, clinical judgement of the provider and imaging are consistent with end stage degenerative joint disease and total joint  arthroplasty is deemed medically necessary. The treatment options including medical management, injection therapy, and arthroplasty were discussed at length. The risks and benefits of Procedure(s): TOTAL HIP ARTHROPLASTY ANTERIOR APPROACH were presented and reviewed.  The risks of nonoperative treatment, versus surgical intervention including but not limited to continued pain, aseptic loosening, stiffness, dislocation/subluxation, infection, bleeding, nerve injury, blood clots, cardiopulmonary complications, morbidity, mortality, among others were discussed. The patient verbalizes understanding and wishes to proceed with the plan.  Patient is being admitted for surgery, pain control, PT, prophylactic antibiotics, VTE prophylaxis, progressive ambulation, ADL's and discharge planning. He will spend the night in observation.   Dental prophylaxis discussed and recommended for 2 years postoperatively.  The patient does meet the criteria for TXA which will be used perioperatively.   Xarelto  will be used postoperatively for DVT prophylaxis in addition to SCDs, and early ambulation. He will continue to take his normal daily Oxycodone 46m q6h and we will supply medicine for acute pain not resolved by that. Plan for Oxycodone, and Tylenol for pain.   Robaxin for muscle spasm.  Zofran for nausea and vomiting. Requests extra tablets sent. Colace for constipation prevention. PBlairThe patient is planning to be discharged home with OPPT and into the care of his mom RAlinda Moneywho can be reached at [phone] up appt 03/18/22 at 4:15pm     MAlisa GraffOffice [phone]/20/2023 4:46 PM

## 2022-02-16 ENCOUNTER — Encounter: Payer: Medicaid Other | Admitting: Nurse Practitioner

## 2022-02-17 ENCOUNTER — Encounter: Payer: Self-pay | Admitting: Internal Medicine

## 2022-02-17 ENCOUNTER — Ambulatory Visit (INDEPENDENT_AMBULATORY_CARE_PROVIDER_SITE_OTHER): Payer: Medicaid Other | Admitting: Internal Medicine

## 2022-02-17 VITALS — BP 132/80 | HR 77 | Ht 68.0 in | Wt 263.2 lb

## 2022-02-17 DIAGNOSIS — E1129 Type 2 diabetes mellitus with other diabetic kidney complication: Secondary | ICD-10-CM | POA: Diagnosis not present

## 2022-02-17 DIAGNOSIS — E782 Mixed hyperlipidemia: Secondary | ICD-10-CM

## 2022-02-17 DIAGNOSIS — I5032 Chronic diastolic (congestive) heart failure: Secondary | ICD-10-CM | POA: Diagnosis not present

## 2022-02-17 DIAGNOSIS — E1122 Type 2 diabetes mellitus with diabetic chronic kidney disease: Secondary | ICD-10-CM | POA: Diagnosis not present

## 2022-02-17 DIAGNOSIS — N189 Chronic kidney disease, unspecified: Secondary | ICD-10-CM | POA: Diagnosis not present

## 2022-02-17 DIAGNOSIS — Z6841 Body Mass Index (BMI) 40.0 and over, adult: Secondary | ICD-10-CM

## 2022-02-17 DIAGNOSIS — R809 Proteinuria, unspecified: Secondary | ICD-10-CM | POA: Diagnosis not present

## 2022-02-17 DIAGNOSIS — Z0001 Encounter for general adult medical examination with abnormal findings: Secondary | ICD-10-CM | POA: Insufficient documentation

## 2022-02-17 DIAGNOSIS — I129 Hypertensive chronic kidney disease with stage 1 through stage 4 chronic kidney disease, or unspecified chronic kidney disease: Secondary | ICD-10-CM | POA: Diagnosis not present

## 2022-02-17 DIAGNOSIS — Z6839 Body mass index (BMI) 39.0-39.9, adult: Secondary | ICD-10-CM | POA: Diagnosis not present

## 2022-02-17 DIAGNOSIS — Z23 Encounter for immunization: Secondary | ICD-10-CM | POA: Diagnosis not present

## 2022-02-17 DIAGNOSIS — E1121 Type 2 diabetes mellitus with diabetic nephropathy: Secondary | ICD-10-CM | POA: Diagnosis not present

## 2022-02-17 MED ORDER — FARXIGA 5 MG PO TABS: 5.0000 mg | ORAL_TABLET | Freq: Every morning | ORAL | 1 refills | Status: DC

## 2022-02-17 MED ORDER — ATORVASTATIN CALCIUM 40 MG PO TABS: 40.0000 mg | ORAL_TABLET | Freq: Every day | ORAL | 3 refills | Status: DC

## 2022-02-17 NOTE — Assessment & Plan Note (Signed)
Presenting today for his annual physical exam -Recent lab work reviewed.  No repeat labs ordered today. -Influenza vaccine administered today -Additional HM items are up-to-date

## 2022-02-17 NOTE — Patient Instructions (Signed)
It was a pleasure to see you today.  Thank you for giving [name] the opportunity to be involved in your care.  Below is a brief recap of your visit and next steps.  We will plan to see you again in 3 months.  Summary We completed your annual physical exam today I refilled atorvastatin and Farxiga.  Next steps Follow up in 3 months

## 2022-02-17 NOTE — Progress Notes (Signed)
Complete physical exam  Patient: [name]   DOB: 1970/10/06   51 y.o. Male  MRN: [id number]  Subjective:    Chief Complaint  Patient presents with   Annual Exam    [name] is a 51 y.o. male who presents today for a complete physical exam. He reports consuming a general diet. Gym/ health club routine includes light weights and stationary bike. He generally feels fairly well. He reports sleeping fairly well. He does not have additional problems to discuss today.    Most recent fall risk assessment:    02/17/2022   10:17 AM  Fall Risk   Falls in the past year? 0  Number falls in past yr: 0  Injury with Fall? 0  Risk for fall due to : No Fall Risks  Follow up Falls evaluation completed     Most recent depression screenings:    02/17/2022   10:18 AM 01/02/2022   10:08 AM  PHQ 2/9 Scores  PHQ - 2 Score 0 0    Vision:Within last year and Dental: No current dental problems and Receives regular dental care  Past Medical History:  Diagnosis Date   ALLERGY 08/03/2008   Qualifier: Diagnosis of  By: Christ Kick     Anemia    Arthritis    "hips" (01/15/2015)   Blood transfusion without reported diagnosis    Cellulitis 07/25/2018   Chronic back pain greater than 3 months duration 02/11/2017   Chronic lower back pain    Colon polyps    COLONIC POLYPS, ADENOMATOUS 08/03/2008   Qualifier: Diagnosis of  By: Christ Kick     CVA 08/03/2008   Qualifier: History of  By: Christ Kick     Depression    denies   Diabetes mellitus    diet controlled- no meds since wt loss   Diabetes mellitus ([place]) 07/10/2019   Dysrhythmia    s/p surgery bariatric- cardioversion   Erectile dysfunction 01/26/2018   GERD (gastroesophageal reflux disease)    HIATAL HERNIA 08/03/2008   Qualifier: Diagnosis of  By: Christ Kick     Hip pain    History of gout    Hyperlipidemia    Hypertension    Joint pain    Narcotic dependence ([place]) 02/11/2017   Neuromuscular  disorder ([place])    PONV (postoperative nausea and vomiting)    Prediabetes 03/29/2017   Sleep apnea    no longer have to use cpap since wt loss   Stage 3 chronic kidney disease ([place]) 01/26/2018   Stroke ([place]) 2006   denies residual on 01/15/2015   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 ([place]) 08/01/2018   Past Surgical History:  Procedure Laterality Date   BARIATRIC SURGERY  2010   in [place]  02/26/2021   Procedure: BIOPSY;  Surgeon: Harvel Quale, MD;  [facility];  Service: Gastroenterology;;   CARDIOVERSION     COLONOSCOPY N/A 08/02/2014   Procedure: COLONOSCOPY;  Surgeon: Rogene Houston, MD;  [facility];  Service: Endoscopy;  Laterality: N/A;  210 - moved to 3/10 @ 11:55 - Ann notified pt   COLONOSCOPY WITH PROPOFOL N/A 02/26/2021   Procedure: COLONOSCOPY WITH PROPOFOL;  Surgeon: Harvel Quale, MD;  [facility];  Service: Gastroenterology;  Laterality: N/A;  10:00   ESOPHAGOGASTRODUODENOSCOPY     ESOPHAGOGASTRODUODENOSCOPY (EGD) WITH PROPOFOL N/A 02/26/2021   Procedure: ESOPHAGOGASTRODUODENOSCOPY (EGD) WITH PROPOFOL;  Surgeon: Harvel Quale, MD;  [facility];  Service: Gastroenterology;  Laterality: N/A;   HERNIA REPAIR     LIPOSUCTION     PANNICULECTOMY  01/14/2015   PANNICULECTOMY N/A 01/14/2015   Procedure:  TOTAL PANNICULECTOMY WTH LIPOSUCTION OF SIDES;  Surgeon: Cristine Polio, MD;  Location: [facility];  Service: Plastics;  Laterality: N/A;   POLYPECTOMY  02/26/2021   Procedure: POLYPECTOMY INTESTINAL;  Surgeon: Harvel Quale, MD;  [facility];  Service: Gastroenterology;;   TONSILLECTOMY     TOTAL HIP ARTHROPLASTY Left 12/16/2021   Procedure: TOTAL HIP ARTHROPLASTY ANTERIOR APPROACH;  Surgeon: Renette Butters, MD;  Location: [facility];  Service: Orthopedics;  Laterality: Left;   VENTRAL HERNIA REPAIR  01/14/2015   VENTRAL HERNIA REPAIR N/A 01/14/2015    Procedure: HERNIA REPAIR VENTRAL ADULT AND MUSCLE REPAIR;  Surgeon: Cristine Polio, MD;  Location: [facility];  Service: Plastics;  Laterality: N/A;   Social History   Tobacco Use   Smoking status: Never   Smokeless tobacco: Never  Vaping Use   Vaping Use: Never used  Substance Use Topics   Alcohol use: No   Drug use: No   Family History  Problem Relation Age of Onset   COPD Mother    Depression Mother    Hyperlipidemia Mother    Hypertension Mother    Heart disease Mother    Thyroid disease Mother    Anxiety disorder Mother    Diabetes Father    Pulmonary embolism Father 13       blood clot   Hyperlipidemia Father    Sudden death Father    Obesity Father    Eating disorder Father    Hypertension Brother    No Known Allergies    Patient Care Team: [name], MD as PCP - General (Internal Medicine) Arnoldo [name], MD as PCP - Cardiology (Cardiology) Craft, Lorel Monaco, RN as Case Manager   Outpatient Medications Prior to Visit  Medication Sig   acetaminophen (TYLENOL) 500 MG tablet Take 2 tablets (1,000 mg total) by mouth every 6 (six) hours as needed for mild pain or moderate pain.   acetaminophen (TYLENOL) 650 MG CR tablet Take 1,950 mg by mouth 2 (two) times daily as needed for pain.   aspirin EC 81 MG tablet Take 81 mg by mouth daily. Swallow whole.   Azelastine HCl (ASTEPRO NA) Place 1 spray into the nose daily as needed (allergies).   blood glucose meter kit and supplies KIT Test daily Dx  r73.03   Blood Pressure Monitor KIT 1 each by Does not apply route daily.   Cyanocobalamin (B-12 PO) Take 1 tablet by mouth daily.   ferrous sulfate 325 (65 FE) MG tablet Take 325 mg by mouth every other day.   gabapentin (NEURONTIN) 300 MG capsule TAKE (1) CAPSULE BY MOUTH AT BEDTIME.   glucose blood test strip Use as instructed. Can check up to 4 times daily.   KERENDIA 10 MG TABS Take 10 mg by mouth daily.   latanoprost (XALATAN) 0.005 % ophthalmic solution Place 1  drop into both eyes at bedtime.   loratadine (CLARITIN) 10 MG tablet Take 10 mg by mouth daily.   losartan (COZAAR) 25 MG tablet Take 1 tablet (25 mg total) by mouth daily.   Melatonin 10 MG CAPS Take 10 mg by mouth at bedtime.   metaxalone (SKELAXIN) 800 MG tablet Take 800 mg by mouth 2 (two) times daily.   Multiple Vitamin (MULTIVITAMIN) tablet Take 1 tablet by  mouth daily. One a day   ondansetron (ZOFRAN-ODT) 4 MG disintegrating tablet Take 1 tablet (4 mg total) by mouth 2 (two) times daily as needed for nausea or vomiting.   Oxycodone HCl 20 MG TABS Take 20 mg by mouth 4 (four) times daily as needed (pain).   Semaglutide, 2 MG/DOSE, 8 MG/3ML SOPN Inject 2 mg as directed once a week. (Patient taking differently: Inject 2 mg as directed every Wednesday.)   sildenafil (VIAGRA) 50 MG tablet Take 1 tablet (50 mg total) by mouth daily as needed for erectile dysfunction.   timolol (BETIMOL) 0.5 % ophthalmic solution Place 1 drop into both eyes in the morning.   tiZANidine (ZANAFLEX) 4 MG tablet Take 1 tablet (4 mg total) by mouth every 8 (eight) hours as needed for muscle spasms. Do not take with other sedation medication, with alcohol or while driving or operating heavy machinery   [DISCONTINUED] atorvastatin (LIPITOR) 40 MG tablet Take 1 tablet (40 mg total) by mouth daily.   [DISCONTINUED] FARXIGA 5 MG TABS tablet Take 5 mg by mouth every morning.   [DISCONTINUED] diclofenac Sodium (VOLTAREN) 1 % GEL Apply 2 g topically 4 (four) times daily. (Patient not taking: Reported on 02/13/2022)   [DISCONTINUED] polyethylene glycol (MIRALAX) 17 g packet Take 17 g by mouth daily. to prevent constipation (Patient not taking: Reported on 02/13/2022)   [DISCONTINUED] rivaroxaban (XARELTO) 10 MG TABS tablet Take 1 tablet (10 mg total) by mouth daily. to prevent blood clots after surgery. YOU MUST TAKE THIS MEDICINE! (Patient not taking: Reported on 02/13/2022)   No facility-administered medications prior to visit.     Review of Systems  Musculoskeletal:  Positive for joint pain (Right hip pain).  All other systems reviewed and are negative.         Objective:     BP 132/80   Pulse 77   Ht '5\' 8"'  (1.727 m)   Wt 263 lb 3.2 oz (119.4 kg)   SpO2 99%   BMI 40.02 kg/m  BP Readings from Last 3 Encounters:  02/17/22 132/80  01/20/22 110/75  01/02/22 137/85      Physical Exam Vitals reviewed.  Constitutional:      General: He is not in acute distress.    Appearance: He is obese. He is not ill-appearing.     Comments: Appears older than stated age  HENT:     Head: Normocephalic and atraumatic.     Nose: Nose normal. No congestion or rhinorrhea.     Mouth/Throat:     Mouth: Mucous membranes are moist.     Pharynx: Oropharynx is clear.  Eyes:     Extraocular Movements: Extraocular movements intact.     Conjunctiva/sclera: Conjunctivae normal.     Pupils: Pupils are equal, round, and reactive to light.  Cardiovascular:     Rate and Rhythm: Normal rate and regular rhythm.     Pulses: Normal pulses.     Heart sounds: Normal heart sounds. No murmur heard. Pulmonary:     Effort: Pulmonary effort is normal.     Breath sounds: Normal breath sounds. No wheezing, rhonchi or rales.  Abdominal:     General: Abdomen is flat. Bowel sounds are normal. There is no distension.     Palpations: Abdomen is soft.     Tenderness: There is no abdominal tenderness.  Musculoskeletal:        General: No swelling or deformity.     Cervical back: Normal range of motion.  Right lower leg: No edema.     Left lower leg: No edema.  Skin:    General: Skin is warm and dry.     Capillary Refill: Capillary refill takes less than 2 seconds.  Neurological:     General: No focal deficit present.     Mental Status: He is alert and oriented to person, place, and time.     Motor: No weakness.  Psychiatric:        Mood and Affect: Mood normal.        Behavior: Behavior normal.        Thought Content: Thought  content normal.     Last CBC Lab Results  Component Value Date   WBC 6.8 12/04/2021   HGB 15.5 12/04/2021   HCT 45.1 12/04/2021   MCV 92.4 12/04/2021   MCH 31.8 12/04/2021   RDW 13.0 12/04/2021   PLT 178 29/79/8921   Last metabolic panel Lab Results  Component Value Date   GLUCOSE 125 (H) 12/04/2021   NA 138 12/04/2021   K 3.9 12/04/2021   CL 105 12/04/2021   CO2 26 12/04/2021   BUN 25 (H) 12/04/2021   CREATININE 1.62 (H) 12/04/2021   GFRNONAA 51 (L) 12/04/2021   CALCIUM 9.6 12/04/2021   PROT 7.2 10/17/2021   ALBUMIN 4.2 10/17/2021   LABGLOB 3.0 10/17/2021   AGRATIO 1.4 10/17/2021   BILITOT 1.3 (H) 10/17/2021   ALKPHOS 113 10/17/2021   AST 22 10/17/2021   ALT 23 10/17/2021   ANIONGAP 7 12/04/2021   Last lipids Lab Results  Component Value Date   CHOL 133 10/17/2021   HDL 42 10/17/2021   LDLCALC 62 10/17/2021   TRIG 174 (H) 10/17/2021   CHOLHDL 3.2 10/17/2021   Last hemoglobin A1c Lab Results  Component Value Date   HGBA1C 6.1 (H) 10/17/2021   Last thyroid functions Lab Results  Component Value Date   TSH 1.81 04/13/2019   T3TOTAL 86 02/15/2019   Last vitamin D Lab Results  Component Value Date   VD25OH 27 (L) 04/13/2019   Last vitamin B12 and Folate Lab Results  Component Value Date   VITAMINB12 815 04/13/2019        Assessment & Plan:    Routine Health Maintenance and Physical Exam  Immunization History  Administered Date(s) Administered   Influenza,inj,Quad PF,6+ Mos 02/11/2017, 01/26/2018, 04/24/2019, 03/06/2020, 02/06/2021, 02/17/2022   PNEUMOCOCCAL CONJUGATE-20 05/15/2021   Pneumococcal-Unspecified 02/22/2009   Tdap 10/24/2019   Zoster Recombinat (Shingrix) 02/06/2021, 10/16/2021    Health Maintenance  Topic Date Due   HEMOGLOBIN A1C  04/19/2022   Diabetic kidney evaluation - Urine ACR  11/08/2022   Diabetic kidney evaluation - GFR measurement  12/05/2022   COLONOSCOPY (Pts 45-58yr Insurance coverage will need to be  confirmed)  02/27/2024   TETANUS/TDAP  10/23/2029   INFLUENZA VACCINE  Completed   Hepatitis C Screening  Completed   HIV Screening  Completed   HPV VACCINES  Aged Out   FOOT EXAM  Discontinued   OPHTHALMOLOGY EXAM  Discontinued   COVID-19 Vaccine  Discontinued   Zoster Vaccines- Shingrix  Discontinued    Discussed health benefits of physical activity, and encouraged him to engage in regular exercise appropriate for his age and condition.  Problem List Items Addressed This Visit       Morbid obesity with body mass index (BMI) of 40.0 to 44.9 in adult ([facility]    He is very motivated to lose weight.  Despite his chronic musculoskeletal pain  he has been going to the gym 3 times weekly where he rides a bike and does light weight exercises.  We discussed changes to his diet.  I recommended incorporating elements of the Mediterranean diet into his regular diet.  He states that he will research this.  Most notably, he is aware of the need to limit sweets and fatty foods.      Annual visit for general adult medical examination with abnormal findings - Primary    Presenting today for his annual physical exam -Recent lab work reviewed.  No repeat labs ordered today. -Influenza vaccine administered today -Additional HM items are up-to-date      Return in about 3 months (around 05/19/2022).     [name], MD

## 2022-02-17 NOTE — Assessment & Plan Note (Signed)
He is very motivated to lose weight.  Despite his chronic musculoskeletal pain he has been going to the gym 3 times weekly where he rides a bike and does light weight exercises.  We discussed changes to his diet.  I recommended incorporating elements of the Mediterranean diet into his regular diet.  He states that he will research this.  Most notably, he is aware of the need to limit sweets and fatty foods.

## 2022-02-17 NOTE — Patient Instructions (Signed)
DUE TO COVID-19 ONLY TWO VISITORS  (aged 52 and older)  ARE ALLOWED TO COME WITH YOU AND STAY IN THE WAITING ROOM ONLY DURING PRE OP AND PROCEDURE.   **NO VISITORS ARE ALLOWED IN THE SHORT STAY AREA OR RECOVERY ROOM!!**  IF YOU WILL BE ADMITTED INTO THE HOSPITAL YOU ARE ALLOWED ONLY FOUR SUPPORT PEOPLE DURING VISITATION HOURS ONLY (7 AM -8PM)   The support person(s) must pass our screening, gel in and out, and wear a mask at all times, including in the patient's room. Patients must also wear a mask when staff or their support person are in the room. Visitors GUEST BADGE MUST BE WORN VISIBLY  One adult visitor may remain with you overnight and MUST be in the room by 8 P.M.     Your procedure is scheduled on: 03/03/22   Report to [facility] Main Entrance    Report to admitting at : 7:15 AM   Call this number if you have problems the morning of surgery [phone]   Do not eat food :After Midnight.   After Midnight you may have the following liquids until: 7:00 AM DAY OF SURGERY  Water Black Coffee (sugar ok, NO MILK/CREAM OR CREAMERS)  Tea (sugar ok, NO MILK/CREAM OR CREAMERS) regular and decaf                             Plain Jell-O (NO RED)                                           Fruit ices (not with fruit pulp, NO RED)                                     Popsicles (NO RED)                                                                  Juice: apple, WHITE grape, WHITE cranberry Sports drinks like Gatorade (NO RED)              Drink G2 drink AT : 7:00 AM the day of surgery.     The day of surgery:  Drink ONE (1) Pre-Surgery Clear Ensure or G2 at AM the morning of surgery. Drink in one sitting. Do not sip.  This drink was given to you during your hospital  pre-op appointment visit. Nothing else to drink after completing the  Pre-Surgery Clear Ensure or G2.          If you have questions, please contact your surgeon's office.    Oral Hygiene is also important  to reduce your risk of infection.                                    Remember - BRUSH YOUR TEETH THE MORNING OF SURGERY WITH YOUR REGULAR TOOTHPASTE   Do NOT smoke after Midnight   Take these medicines the morning of surgery with A SIP  OF WATER: loratadine.Tylenol as needed.Eye drops as usual. How to Manage Your Diabetes Before and After Surgery  Why is it important to control my blood sugar before and after surgery? Improving blood sugar levels before and after surgery helps healing and can limit problems. A way of improving blood sugar control is eating a healthy diet by:  Eating less sugar and carbohydrates  Increasing activity/exercise  Talking with your doctor about reaching your blood sugar goals High blood sugars (greater than 180 mg/dL) can raise your risk of infections and slow your recovery, so you will need to focus on controlling your diabetes during the weeks before surgery. Make sure that the doctor who takes care of your diabetes knows about your planned surgery including the date and location.  How do I manage my blood sugar before surgery? Check your blood sugar at least 4 times a day, starting 2 days before surgery, to make sure that the level is not too high or low. Check your blood sugar the morning of your surgery when you wake up and every 2 hours until you get to the Short Stay unit. If your blood sugar is less than 70 mg/dL, you will need to treat for low blood sugar: Do not take insulin. Treat a low blood sugar (less than 70 mg/dL) with  cup of clear juice (cranberry or apple), 4 glucose tablets, OR glucose gel. Recheck blood sugar in 15 minutes after treatment (to make sure it is greater than 70 mg/dL). If your blood sugar is not greater than 70 mg/dL on recheck, call [phone] for further instructions. Report your blood sugar to the short stay nurse when you get to Short Stay.  If you are admitted to the hospital after surgery: Your blood sugar will be  checked by the staff and you will probably be given insulin after surgery (instead of oral diabetes medicines) to make sure you have good blood sugar levels. The goal for blood sugar control after surgery is 80-180 mg/dL.   WHAT DO I DO ABOUT MY DIABETES MEDICATION?  Do not take oral diabetes medicines (pills) the morning of surgery.  HOLD farxiga 72 hours (3 days) before surgery.     THE MORNING OF SURGERY, DO NOT TAKE ANY ORAL DIABETIC MEDICATIONS DAY OF YOUR SURGERY  Bring CPAP mask and tubing day of surgery.                              You may not have any metal on your body including hair pins, jewelry, and body piercing             Do not wear lotions, powders, perfumes/cologne, or deodorant              Men may shave face and neck.   Do not bring valuables to the hospital. [place].   Contacts, dentures or bridgework may not be worn into surgery.   Bring small overnight bag day of surgery.   DO NOT [place]. PHARMACY WILL DISPENSE MEDICATIONS LISTED ON YOUR MEDICATION LIST TO YOU DURING YOUR ADMISSION [place]!    Patients discharged on the day of surgery will not be allowed to drive home.  Someone NEEDS to stay with you for the first 24 hours after anesthesia.   Special Instructions: Bring  a copy of your healthcare power of attorney and living will documents         the day of surgery if you haven't scanned them before.              Please read over the following fact sheets you were given: IF YOU HAVE QUESTIONS ABOUT YOUR PRE-OP INSTRUCTIONS PLEASE CALL [phone]     [facility] - Preparing for Surgery Before surgery, you can play an important role.  Because skin is not sterile, your skin needs to be as free of germs as possible.  You can reduce the number of germs on your skin by washing with CHG (chlorahexidine gluconate) soap before surgery.  CHG is an antiseptic cleaner  which kills germs and bonds with the skin to continue killing germs even after washing. Please DO NOT use if you have an allergy to CHG or antibacterial soaps.  If your skin becomes reddened/irritated stop using the CHG and inform your nurse when you arrive at Short Stay. Do not shave (including legs and underarms) for at least 48 hours prior to the first CHG shower.  You may shave your face/neck. Please follow these instructions carefully:  1.  Shower with CHG Soap the night before surgery and the  morning of Surgery.  2.  If you choose to wash your hair, wash your hair first as usual with your  normal  shampoo.  3.  After you shampoo, rinse your hair and body thoroughly to remove the  shampoo.                           4.  Use CHG as you would any other liquid soap.  You can apply chg directly  to the skin and wash                       Gently with a scrungie or clean washcloth.  5.  Apply the CHG Soap to your body ONLY FROM THE NECK DOWN.   Do not use on face/ open                           Wound or open sores. Avoid contact with eyes, ears mouth and genitals (private parts).                       Wash face,  Genitals (private parts) with your normal soap.             6.  Wash thoroughly, paying special attention to the area where your surgery  will be performed.  7.  Thoroughly rinse your body with warm water from the neck down.  8.  DO NOT shower/wash with your normal soap after using and rinsing off  the CHG Soap.                9.  Pat yourself dry with a clean towel.            10.  Wear clean pajamas.            11.  Place clean sheets on your bed the night of your first shower and do not  sleep with pets. Day of Surgery : Do not apply any lotions/deodorants the morning of surgery.  Please wear clean clothes to the hospital/surgery center.  FAILURE TO FOLLOW THESE INSTRUCTIONS MAY RESULT IN THE CANCELLATION  OF YOUR SURGERY PATIENT SIGNATURE_________________________________  NURSE  SIGNATURE__________________________________  ________________________________________________________________________   Adam Phenix  An incentive spirometer is a tool that can help keep your lungs clear and active. This tool measures how well you are filling your lungs with each breath. Taking long deep breaths may help reverse or decrease the chance of developing breathing (pulmonary) problems (especially infection) following: A long period of time when you are unable to move or be active. BEFORE THE PROCEDURE  If the spirometer includes an indicator to show your best effort, your nurse or respiratory therapist will set it to a desired goal. If possible, sit up straight or lean slightly forward. Try not to slouch. Hold the incentive spirometer in an upright position. INSTRUCTIONS FOR USE  Sit on the edge of your bed if possible, or sit up as far as you can in bed or on a chair. Hold the incentive spirometer in an upright position. Breathe out normally. Place the mouthpiece in your mouth and seal your lips tightly around it. Breathe in slowly and as deeply as possible, raising the piston or the ball toward the top of the column. Hold your breath for 3-5 seconds or for as long as possible. Allow the piston or ball to fall to the bottom of the column. Remove the mouthpiece from your mouth and breathe out normally. Rest for a few seconds and repeat Steps 1 through 7 at least 10 times every 1-2 hours when you are awake. Take your time and take a few normal breaths between deep breaths. The spirometer may include an indicator to show your best effort. Use the indicator as a goal to work toward during each repetition. After each set of 10 deep breaths, practice coughing to be sure your lungs are clear. If you have an incision (the cut made at the time of surgery), support your incision when coughing by placing a pillow or rolled up towels firmly against it. Once you are able to get out of  bed, walk around indoors and cough well. You may stop using the incentive spirometer when instructed by your caregiver.  RISKS AND COMPLICATIONS Take your time so you do not get dizzy or light-headed. If you are in pain, you may need to take or ask for pain medication before doing incentive spirometry. It is harder to take a deep breath if you are having pain. AFTER USE Rest and breathe slowly and easily. It can be helpful to keep track of a log of your progress. Your caregiver can provide you with a simple table to help with this. If you are using the spirometer at home, follow these instructions: [place] IF:  You are having difficultly using the spirometer. You have trouble using the spirometer as often as instructed. Your pain medication is not giving enough relief while using the spirometer. You develop fever of 100.5 F (38.1 C) or higher. SEEK IMMEDIATE MEDICAL CARE IF:  You cough up bloody sputum that had not been present before. You develop fever of 102 F (38.9 C) or greater. You develop worsening pain at or near the incision site. MAKE SURE YOU:  Understand these instructions. Will watch your condition. Will get help right away if you are not doing well or get worse. Document Released: 09/21/2006 Document Revised: 08/03/2011 Document Reviewed: 11/22/2006 [facility] Patient Information 2014 [place], [place].   ________________________________________________________________________

## 2022-02-18 ENCOUNTER — Encounter (HOSPITAL_COMMUNITY): Payer: Self-pay

## 2022-02-18 ENCOUNTER — Encounter (HOSPITAL_COMMUNITY)
Admission: RE | Admit: 2022-02-18 | Discharge: 2022-02-18 | Disposition: A | Discharge status: Home / Self Care | Payer: Medicaid Other | Source: Ambulatory Visit | Attending: Orthopedic Surgery | Admitting: Orthopedic Surgery

## 2022-02-18 ENCOUNTER — Other Ambulatory Visit: Payer: Self-pay

## 2022-02-18 VITALS — BP 113/76 | HR 84 | Temp 98.3°F | Ht 68.0 in

## 2022-02-18 DIAGNOSIS — Z01812 Encounter for preprocedural laboratory examination: Secondary | ICD-10-CM | POA: Insufficient documentation

## 2022-02-18 DIAGNOSIS — I10 Essential (primary) hypertension: Secondary | ICD-10-CM | POA: Insufficient documentation

## 2022-02-18 DIAGNOSIS — Z01818 Encounter for other preprocedural examination: Secondary | ICD-10-CM

## 2022-02-18 DIAGNOSIS — E1121 Type 2 diabetes mellitus with diabetic nephropathy: Secondary | ICD-10-CM | POA: Diagnosis not present

## 2022-02-18 LAB — CBC
HCT: 44.7 % (ref 39.0–52.0)
Hemoglobin: 14.8 g/dL (ref 13.0–17.0)
MCH: 31.4 pg (ref 26.0–34.0)
MCHC: 33.1 g/dL (ref 30.0–36.0)
MCV: 94.7 fL (ref 80.0–100.0)
Platelets: 214 10*3/uL (ref 150–400)
RBC: 4.72 MIL/uL (ref 4.22–5.81)
RDW: 13.4 % (ref 11.5–15.5)
WBC: 8.8 10*3/uL (ref 4.0–10.5)
nRBC: 0 % (ref 0.0–0.2)

## 2022-02-18 LAB — BASIC METABOLIC PANEL
Anion gap: 8 (ref 5–15)
BUN: 31 mg/dL — ABNORMAL HIGH (ref 6–20)
CO2: 25 mmol/L (ref 22–32)
Calcium: 9.6 mg/dL (ref 8.9–10.3)
Chloride: 105 mmol/L (ref 98–111)
Creatinine, Ser: 1.44 mg/dL — ABNORMAL HIGH (ref 0.61–1.24)
GFR, Estimated: 58 mL/min — ABNORMAL LOW (ref >60)
Glucose, Bld: 142 mg/dL — ABNORMAL HIGH (ref 70–99)
Potassium: 4.2 mmol/L (ref 3.5–5.1)
Sodium: 138 mmol/L (ref 135–145)

## 2022-02-18 LAB — TYPE AND SCREEN
ABO/RH(D): O POS
Antibody Screen: NEGATIVE

## 2022-02-18 LAB — HEMOGLOBIN A1C
Hgb A1c MFr Bld: 5.7 % — ABNORMAL HIGH (ref 4.8–5.6)
Mean Plasma Glucose: 116.89 mg/dL

## 2022-02-18 LAB — SURGICAL PCR SCREEN
MRSA, PCR: POSITIVE — AB
Staphylococcus aureus: POSITIVE — AB

## 2022-02-18 LAB — GLUCOSE, CAPILLARY: Glucose-Capillary: 147 mg/dL — ABNORMAL HIGH (ref 70–99)

## 2022-02-18 NOTE — Progress Notes (Signed)
PCR: + STAPH/ + MRSA 

## 2022-02-18 NOTE — Progress Notes (Signed)
For Short Stay: [place] appointment date: Date of COVID positive in last 38 days:  Bowel Prep reminder:   For Anesthesia: PCP - Dr. Marland Kitchen Cardiologist - Dr. Carlyle Dolly. Clearance: Dayna Dunn: PAC: 01/01/22: Epic. Chest x-ray -  EKG - 06/26/21 Stress Test -  ECHO - 02/20/22 Cardiac Cath -  Pacemaker/ICD device last checked: Pacemaker orders received: Device Rep notified:  Spinal Cord Stimulator:  Sleep Study - Yes CPAP - Yes  Fasting Blood Sugar - 100's Checks Blood Sugar ___2__ times a day Date and result of last Hgb A1c- 6.1: 10/17/21  Blood Thinner Instructions: Aspirin Instructions: NO instructions yet. Last Dose:  Activity level: Can go up a flight of stairs and activities of daily living without stopping and without chest pain and/or shortness of breath   Able to exercise without chest pain and/or shortness of breath   Unable to go up a flight of stairs without chest pain and/or shortness of breath     Anesthesia review: Hx: DIA,HTN,CKF III,CVA,Dysrhythmias,OSA(CPAP)  Patient denies shortness of breath, fever, cough and chest pain at PAT appointment   Patient verbalized understanding of instructions that were given to them at the PAT appointment. Patient was also instructed that they will need to review over the PAT instructions again at home before surgery.

## 2022-02-20 DIAGNOSIS — G4733 Obstructive sleep apnea (adult) (pediatric): Secondary | ICD-10-CM | POA: Diagnosis not present

## 2022-02-20 NOTE — Progress Notes (Signed)
Anesthesia Chart Review   Case: [id number] Date/Time: 03/03/22 0946   Procedure: TOTAL HIP ARTHROPLASTY ANTERIOR APPROACH (Right: Hip)   Anesthesia type: General   Pre-op diagnosis: OA RIGHT HIP   Location[facility] ROOM 08 / [facility]   Surgeons: Renette Butters, MD       DISCUSSION:51 y.o. never smoker with h/o PONV, sleep apnea, DM II, HTN, CKD Stage III, right hip OA scheduled for above procedure 03/03/2022 with Dr. Edmonia Lynch.   Per cardiology preoperative evaluation 01/01/2022, "Chart reviewed as part of pre-operative protocol coverage. Patient was just recently seen 11/05/21 by Dr. Harl Bowie and cleared for hip surgery. He had the left hip operated on 11/2021 and states he did well. His clearance is still good per our protocol since it was completed in the last 2 months. I spoke with the patient to ensure no new changes. The patient affirms he has been doing well without any new cardiac symptoms. Therefore, OK to proceed acceptable risk for the planned procedure without further cardiovascular testing. The patient was advised that if he develops new symptoms prior to surgery to contact our office to arrange for a follow-up visit, and he verbalized understanding.   Patient has Xarelto listed on med list from prior surgery's DVT prophylaxis prescription so will defer to surgical team to advise patient on this. Of note in 10/2021 Dr. Harl Bowie recommended he start a baby aspirin. Patient reports he was asked to stop due to the hip surgeries. We would recommend to resume aspirin post-operatively when felt safe by requesting surgical team."  Anticipate pt can proceed with planned procedure barring acute status change.   VS: BP 113/76   Pulse 84   Temp 36.8 C (Oral)   Ht 5' 8" (1.727 m)   SpO2 99%   BMI 40.02 kg/m   PROVIDERS: [name], MD is PCP   Cardiologist - Dr. Carlyle Dolly LABS: Labs reviewed: Acceptable for surgery. (all labs ordered are listed, but only abnormal results are  displayed)  Labs Reviewed  SURGICAL PCR SCREEN - Abnormal; Notable for the following components:      Result Value   MRSA, PCR POSITIVE (*)    Staphylococcus aureus POSITIVE (*)    All other components within normal limits  HEMOGLOBIN A1C - Abnormal; Notable for the following components:   Hgb A1c MFr Bld 5.7 (*)    All other components within normal limits  BASIC METABOLIC PANEL - Abnormal; Notable for the following components:   Glucose, Bld 142 (*)    BUN 31 (*)    Creatinine, Ser 1.44 (*)    GFR, Estimated 58 (*)    All other components within normal limits  GLUCOSE, CAPILLARY - Abnormal; Notable for the following components:   Glucose-Capillary 147 (*)    All other components within normal limits  CBC  TYPE AND SCREEN     IMAGES:   EKG:   CV: Myocardial Perfusion 07/02/2021   Findings are consistent with ischemia. The study is low risk.   No ST deviation was noted.   LV perfusion is abnormal. There is evidence of ischemia. There is no evidence of infarction. Defect 1: There is a medium defect with mild reduction in uptake present in the inferoseptal location(s) that is reversible.   Left ventricular function is normal. End diastolic cavity size is normal. End systolic cavity size is normal.   Diaphragmatic attenuation and motion artifact Baseline ECG RBBB/LAFB SDS 6 with area of inferior septal/apical ischemia EF normal  68%   Echo 02/21/2019  1. Left ventricular ejection fraction, by visual estimation, is 60 to  65%. The left ventricle has normal function. Normal left ventricular size.  There is moderately increased left ventricular hypertrophy.   2. Left ventricular diastolic Doppler parameters are consistent with  impaired relaxation pattern of LV diastolic filling.   3. Global right ventricle has normal systolic function.The right  ventricular size is normal. No increase in right ventricular wall  thickness.   4. Left atrial size was normal.   5. Right atrial  size was normal.   6. The mitral valve is normal in structure. No evidence of mitral valve  regurgitation. No evidence of mitral stenosis.   7. The tricuspid valve is normal in structure. Tricuspid valve  regurgitation was not visualized by color flow Doppler.   8. The aortic valve is normal in structure. Aortic valve regurgitation  was not visualized by color flow Doppler. Structurally normal aortic  valve, with no evidence of sclerosis or stenosis.   9. The pulmonic valve was normal in structure. Pulmonic valve  regurgitation is not visualized by color flow Doppler.  10. The inferior vena cava is normal in size with greater than 50%  respiratory variability, suggesting right atrial pressure of 3 mmHg.  Past Medical History:  Diagnosis Date   ALLERGY 08/03/2008   Qualifier: Diagnosis of  By: Christ Kick     Anemia    Arthritis    "hips" (01/15/2015)   Blood transfusion without reported diagnosis    Cellulitis 07/25/2018   Chronic back pain greater than 3 months duration 02/11/2017   Chronic lower back pain    Colon polyps    COLONIC POLYPS, ADENOMATOUS 08/03/2008   Qualifier: Diagnosis of  By: Christ Kick     CVA 08/03/2008   Qualifier: History of  By: Christ Kick     Depression    denies   Diabetes mellitus    diet controlled- no meds since wt loss   Diabetes mellitus ([place]) 07/10/2019   Dysrhythmia    s/p surgery bariatric- cardioversion   Erectile dysfunction 01/26/2018   GERD (gastroesophageal reflux disease)    HIATAL HERNIA 08/03/2008   Qualifier: Diagnosis of  By: Christ Kick     Hip pain    History of gout    Hyperlipidemia    Hypertension    Joint pain    Narcotic dependence ([place]) 02/11/2017   Neuromuscular disorder ([place])    PONV (postoperative nausea and vomiting)    Prediabetes 03/29/2017   Sleep apnea    no longer have to use cpap since wt loss   Stage 3 chronic kidney disease ([place]) 01/26/2018   Stroke ([place]) 2006   denies residual  on 01/15/2015   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 ([place]) 08/01/2018    Past Surgical History:  Procedure Laterality Date   BARIATRIC SURGERY  2010   in [place]  02/26/2021   Procedure: BIOPSY;  Surgeon: Harvel Quale, MD;  [facility];  Service: Gastroenterology;;   CARDIOVERSION     COLONOSCOPY N/A 08/02/2014   Procedure: COLONOSCOPY;  Surgeon: Rogene Houston, MD;  [facility];  Service: Endoscopy;  Laterality: N/A;  210 - moved to 3/10 @ 11:55 - Ann notified pt   COLONOSCOPY WITH PROPOFOL N/A 02/26/2021   Procedure: COLONOSCOPY WITH PROPOFOL;  Surgeon: Harvel Quale, MD;  [facility];  Service: Gastroenterology;  Laterality: N/A;  10:00  ESOPHAGOGASTRODUODENOSCOPY     ESOPHAGOGASTRODUODENOSCOPY (EGD) WITH PROPOFOL N/A 02/26/2021   Procedure: ESOPHAGOGASTRODUODENOSCOPY (EGD) WITH PROPOFOL;  Surgeon: Harvel Quale, MD;  [facility];  Service: Gastroenterology;  Laterality: N/A;   HERNIA REPAIR     LIPOSUCTION     PANNICULECTOMY  01/14/2015   PANNICULECTOMY N/A 01/14/2015   Procedure:  TOTAL PANNICULECTOMY WTH LIPOSUCTION OF SIDES;  Surgeon: Cristine Polio, MD;  Location: [facility];  Service: Plastics;  Laterality: N/A;   POLYPECTOMY  02/26/2021   Procedure: POLYPECTOMY INTESTINAL;  Surgeon: Harvel Quale, MD;  [facility];  Service: Gastroenterology;;   TONSILLECTOMY     TOTAL HIP ARTHROPLASTY Left 12/16/2021   Procedure: TOTAL HIP ARTHROPLASTY ANTERIOR APPROACH;  Surgeon: Renette Butters, MD;  [facility];  Service: Orthopedics;  Laterality: Left;   VENTRAL HERNIA REPAIR  01/14/2015   VENTRAL HERNIA REPAIR N/A 01/14/2015   Procedure: HERNIA REPAIR VENTRAL ADULT AND MUSCLE REPAIR;  Surgeon: Cristine Polio, MD;  Location: [place];  Service: Plastics;  Laterality: N/A;    MEDICATIONS:  acetaminophen (TYLENOL) 500 MG tablet   acetaminophen (TYLENOL) 650 MG CR  tablet   aspirin EC 81 MG tablet   atorvastatin (LIPITOR) 40 MG tablet   Azelastine HCl (ASTEPRO NA)   blood glucose meter kit and supplies KIT   Blood Pressure Monitor KIT   Cyanocobalamin (B-12 PO)   FARXIGA 5 MG TABS tablet   ferrous sulfate 325 (65 FE) MG tablet   gabapentin (NEURONTIN) 300 MG capsule   glucose blood test strip   KERENDIA 10 MG TABS   latanoprost (XALATAN) 0.005 % ophthalmic solution   loratadine (CLARITIN) 10 MG tablet   losartan (COZAAR) 25 MG tablet   Melatonin 10 MG CAPS   metaxalone (SKELAXIN) 800 MG tablet   Multiple Vitamin (MULTIVITAMIN) tablet   ondansetron (ZOFRAN-ODT) 4 MG disintegrating tablet   Oxycodone HCl 20 MG TABS   Semaglutide, 2 MG/DOSE, 8 MG/3ML SOPN   sildenafil (VIAGRA) 50 MG tablet   timolol (BETIMOL) 0.5 % ophthalmic solution   tiZANidine (ZANAFLEX) 4 MG tablet   No current facility-administered medications for this encounter.     Konrad Felix Ward, PA-C [facility] [phone]

## 2022-02-21 DIAGNOSIS — R809 Proteinuria, unspecified: Secondary | ICD-10-CM | POA: Diagnosis not present

## 2022-02-21 DIAGNOSIS — I5032 Chronic diastolic (congestive) heart failure: Secondary | ICD-10-CM | POA: Diagnosis not present

## 2022-02-21 DIAGNOSIS — E1129 Type 2 diabetes mellitus with other diabetic kidney complication: Secondary | ICD-10-CM | POA: Diagnosis not present

## 2022-02-21 DIAGNOSIS — I129 Hypertensive chronic kidney disease with stage 1 through stage 4 chronic kidney disease, or unspecified chronic kidney disease: Secondary | ICD-10-CM | POA: Diagnosis not present

## 2022-02-21 DIAGNOSIS — Z6839 Body mass index (BMI) 39.0-39.9, adult: Secondary | ICD-10-CM | POA: Diagnosis not present

## 2022-02-21 DIAGNOSIS — N189 Chronic kidney disease, unspecified: Secondary | ICD-10-CM | POA: Diagnosis not present

## 2022-02-21 DIAGNOSIS — E1122 Type 2 diabetes mellitus with diabetic chronic kidney disease: Secondary | ICD-10-CM | POA: Diagnosis not present

## 2022-02-27 DIAGNOSIS — Z79899 Other long term (current) drug therapy: Secondary | ICD-10-CM | POA: Diagnosis not present

## 2022-03-03 ENCOUNTER — Ambulatory Visit (HOSPITAL_COMMUNITY): Payer: Medicaid Other

## 2022-03-03 ENCOUNTER — Encounter (HOSPITAL_COMMUNITY)
Admission: RE | Disposition: A | Discharge status: Home / Self Care | Payer: Self-pay | Source: Ambulatory Visit | Attending: Orthopedic Surgery

## 2022-03-03 ENCOUNTER — Observation Stay (HOSPITAL_COMMUNITY)
Admission: RE | Admit: 2022-03-03 | Discharge: 2022-03-04 | Disposition: A | Discharge status: Home / Self Care | Payer: Medicaid Other | Source: Ambulatory Visit | Attending: Orthopedic Surgery | Admitting: Orthopedic Surgery

## 2022-03-03 ENCOUNTER — Encounter (HOSPITAL_COMMUNITY): Payer: Self-pay | Admitting: Orthopedic Surgery

## 2022-03-03 ENCOUNTER — Other Ambulatory Visit: Payer: Self-pay

## 2022-03-03 ENCOUNTER — Ambulatory Visit (HOSPITAL_BASED_OUTPATIENT_CLINIC_OR_DEPARTMENT_OTHER): Payer: Medicaid Other | Admitting: Anesthesiology

## 2022-03-03 ENCOUNTER — Ambulatory Visit (HOSPITAL_COMMUNITY): Payer: Medicaid Other | Admitting: Physician Assistant

## 2022-03-03 DIAGNOSIS — Z8673 Personal history of transient ischemic attack (TIA), and cerebral infarction without residual deficits: Secondary | ICD-10-CM | POA: Diagnosis not present

## 2022-03-03 DIAGNOSIS — Z7985 Long-term (current) use of injectable non-insulin antidiabetic drugs: Secondary | ICD-10-CM

## 2022-03-03 DIAGNOSIS — N183 Chronic kidney disease, stage 3 unspecified: Secondary | ICD-10-CM

## 2022-03-03 DIAGNOSIS — E1122 Type 2 diabetes mellitus with diabetic chronic kidney disease: Secondary | ICD-10-CM | POA: Insufficient documentation

## 2022-03-03 DIAGNOSIS — Z7901 Long term (current) use of anticoagulants: Secondary | ICD-10-CM | POA: Insufficient documentation

## 2022-03-03 DIAGNOSIS — M1611 Unilateral primary osteoarthritis, right hip: Secondary | ICD-10-CM

## 2022-03-03 DIAGNOSIS — D631 Anemia in chronic kidney disease: Secondary | ICD-10-CM | POA: Diagnosis not present

## 2022-03-03 DIAGNOSIS — Z471 Aftercare following joint replacement surgery: Secondary | ICD-10-CM | POA: Diagnosis not present

## 2022-03-03 DIAGNOSIS — Z79899 Other long term (current) drug therapy: Secondary | ICD-10-CM | POA: Insufficient documentation

## 2022-03-03 DIAGNOSIS — Z96642 Presence of left artificial hip joint: Secondary | ICD-10-CM | POA: Diagnosis not present

## 2022-03-03 DIAGNOSIS — Z96641 Presence of right artificial hip joint: Secondary | ICD-10-CM | POA: Diagnosis not present

## 2022-03-03 DIAGNOSIS — N189 Chronic kidney disease, unspecified: Secondary | ICD-10-CM | POA: Diagnosis not present

## 2022-03-03 DIAGNOSIS — I129 Hypertensive chronic kidney disease with stage 1 through stage 4 chronic kidney disease, or unspecified chronic kidney disease: Secondary | ICD-10-CM | POA: Diagnosis not present

## 2022-03-03 HISTORY — PX: TOTAL HIP ARTHROPLASTY: SHX124

## 2022-03-03 LAB — GLUCOSE, CAPILLARY
Glucose-Capillary: 107 mg/dL — ABNORMAL HIGH (ref 70–99)
Glucose-Capillary: 142 mg/dL — ABNORMAL HIGH (ref 70–99)

## 2022-03-03 SURGERY — ARTHROPLASTY, HIP, TOTAL, ANTERIOR APPROACH
Anesthesia: Monitor Anesthesia Care | Site: Hip | Laterality: Right

## 2022-03-03 MED ORDER — DEXAMETHASONE SODIUM PHOSPHATE 10 MG/ML IJ SOLN: 8.0000 mg | Freq: Once | INTRAMUSCULAR | Status: DC

## 2022-03-03 MED ORDER — CEFAZOLIN SODIUM-DEXTROSE 2-4 GM/100ML-% IV SOLN
2.0000 g | INTRAVENOUS | Status: AC
  Administered 2022-03-03: 2 g via INTRAVENOUS
  Filled 2022-03-03: qty 100

## 2022-03-03 MED ORDER — ACETAMINOPHEN 500 MG PO TABS
1000.0000 mg | ORAL_TABLET | Freq: Four times a day (QID) | ORAL | Status: AC
  Administered 2022-03-03 – 2022-03-04 (×4): 1000 mg via ORAL
  Filled 2022-03-03 (×4): qty 2

## 2022-03-03 MED ORDER — OXYCODONE HCL 5 MG PO TABS: 5.0000 mg | ORAL_TABLET | ORAL | Status: DC | PRN

## 2022-03-03 MED ORDER — METOCLOPRAMIDE HCL 5 MG PO TABS: 5.0000 mg | ORAL_TABLET | Freq: Three times a day (TID) | ORAL | Status: DC | PRN

## 2022-03-03 MED ORDER — PROPOFOL 1000 MG/100ML IV EMUL
INTRAVENOUS | Status: AC
  Filled 2022-03-03: qty 100

## 2022-03-03 MED ORDER — PROPOFOL 500 MG/50ML IV EMUL
INTRAVENOUS | Status: DC | PRN
  Administered 2022-03-03: 65 ug/kg/min via INTRAVENOUS

## 2022-03-03 MED ORDER — LIDOCAINE 2% (20 MG/ML) 5 ML SYRINGE
INTRAMUSCULAR | Status: DC | PRN
  Administered 2022-03-03: 40 mg via INTRAVENOUS

## 2022-03-03 MED ORDER — MELATONIN 5 MG PO TABS
10.0000 mg | ORAL_TABLET | Freq: Every day | ORAL | Status: DC
  Administered 2022-03-03: 10 mg via ORAL
  Filled 2022-03-03: qty 2

## 2022-03-03 MED ORDER — PROPOFOL 10 MG/ML IV BOLUS
INTRAVENOUS | Status: DC | PRN
  Administered 2022-03-03 (×2): 20 mg via INTRAVENOUS

## 2022-03-03 MED ORDER — ACETAMINOPHEN 325 MG PO TABS: 325.0000 mg | ORAL_TABLET | Freq: Four times a day (QID) | ORAL | Status: DC | PRN

## 2022-03-03 MED ORDER — TIMOLOL MALEATE 0.5 % OP SOLN
1.0000 [drp] | Freq: Every morning | OPHTHALMIC | Status: DC
  Administered 2022-03-04: 1 [drp] via OPHTHALMIC
  Filled 2022-03-03: qty 5

## 2022-03-03 MED ORDER — POLYETHYLENE GLYCOL 3350 17 G PO PACK: 17.0000 g | PACK | Freq: Every day | ORAL | Status: DC | PRN

## 2022-03-03 MED ORDER — LOSARTAN POTASSIUM 25 MG PO TABS
25.0000 mg | ORAL_TABLET | Freq: Every day | ORAL | Status: DC
  Administered 2022-03-04: 25 mg via ORAL
  Filled 2022-03-03: qty 1

## 2022-03-03 MED ORDER — METHOCARBAMOL 500 MG PO TABS: 500.0000 mg | ORAL_TABLET | Freq: Four times a day (QID) | ORAL | Status: DC | PRN

## 2022-03-03 MED ORDER — BUPIVACAINE LIPOSOME 1.3 % IJ SUSP: 10.0000 mL | Freq: Once | INTRAMUSCULAR | Status: DC

## 2022-03-03 MED ORDER — DEXAMETHASONE SODIUM PHOSPHATE 10 MG/ML IJ SOLN
INTRAMUSCULAR | Status: DC | PRN
  Administered 2022-03-03: 10 mg via INTRAVENOUS

## 2022-03-03 MED ORDER — ACETAMINOPHEN 500 MG PO TABS
1000.0000 mg | ORAL_TABLET | Freq: Once | ORAL | Status: DC
  Filled 2022-03-03: qty 2

## 2022-03-03 MED ORDER — PANTOPRAZOLE SODIUM 40 MG PO TBEC
40.0000 mg | DELAYED_RELEASE_TABLET | Freq: Every day | ORAL | Status: DC
  Administered 2022-03-03 – 2022-03-04 (×2): 40 mg via ORAL
  Filled 2022-03-03 (×2): qty 1

## 2022-03-03 MED ORDER — ORAL CARE MOUTH RINSE: 15.0000 mL | Freq: Once | OROMUCOSAL | Status: AC

## 2022-03-03 MED ORDER — TRANEXAMIC ACID-NACL 1000-0.7 MG/100ML-% IV SOLN
1000.0000 mg | INTRAVENOUS | Status: AC
  Administered 2022-03-03: 1000 mg via INTRAVENOUS

## 2022-03-03 MED ORDER — TRANEXAMIC ACID-NACL 1000-0.7 MG/100ML-% IV SOLN
1000.0000 mg | Freq: Once | INTRAVENOUS | Status: AC
  Administered 2022-03-03: 1000 mg via INTRAVENOUS
  Filled 2022-03-03: qty 100

## 2022-03-03 MED ORDER — ONDANSETRON HCL 4 MG PO TABS: 4.0000 mg | ORAL_TABLET | Freq: Four times a day (QID) | ORAL | Status: DC | PRN

## 2022-03-03 MED ORDER — LORATADINE 10 MG PO TABS
10.0000 mg | ORAL_TABLET | Freq: Every day | ORAL | Status: DC
  Administered 2022-03-03 – 2022-03-04 (×2): 10 mg via ORAL
  Filled 2022-03-03 (×2): qty 1

## 2022-03-03 MED ORDER — MENTHOL 3 MG MT LOZG: 1.0000 | LOZENGE | OROMUCOSAL | Status: DC | PRN

## 2022-03-03 MED ORDER — FINERENONE 10 MG PO TABS: 10.0000 mg | ORAL_TABLET | Freq: Every day | ORAL | Status: DC

## 2022-03-03 MED ORDER — ADULT MULTIVITAMIN W/MINERALS CH
1.0000 | ORAL_TABLET | Freq: Every day | ORAL | Status: DC
  Administered 2022-03-04: 1 via ORAL
  Filled 2022-03-03: qty 1

## 2022-03-03 MED ORDER — METHOCARBAMOL 500 MG IVPB - SIMPLE MED: 500.0000 mg | Freq: Four times a day (QID) | INTRAVENOUS | Status: DC | PRN

## 2022-03-03 MED ORDER — METHOCARBAMOL 500 MG IVPB - SIMPLE MED
INTRAVENOUS | Status: AC
  Filled 2022-03-03: qty 55

## 2022-03-03 MED ORDER — GABAPENTIN 300 MG PO CAPS
300.0000 mg | ORAL_CAPSULE | Freq: Every day | ORAL | Status: DC
  Administered 2022-03-03: 300 mg via ORAL
  Filled 2022-03-03: qty 1

## 2022-03-03 MED ORDER — 0.9 % SODIUM CHLORIDE (POUR BTL) OPTIME
TOPICAL | Status: DC | PRN
  Administered 2022-03-03: 1000 mL

## 2022-03-03 MED ORDER — BUPIVACAINE HCL (PF) 0.5 % IJ SOLN
INTRAMUSCULAR | Status: AC
  Filled 2022-03-03: qty 30

## 2022-03-03 MED ORDER — BISACODYL 10 MG RE SUPP: 10.0000 mg | Freq: Every day | RECTAL | Status: DC | PRN

## 2022-03-03 MED ORDER — FENTANYL CITRATE (PF) 250 MCG/5ML IJ SOLN
INTRAMUSCULAR | Status: AC
  Filled 2022-03-03: qty 5

## 2022-03-03 MED ORDER — DIPHENHYDRAMINE HCL 12.5 MG/5ML PO ELIX: 12.5000 mg | ORAL_SOLUTION | ORAL | Status: DC | PRN

## 2022-03-03 MED ORDER — HYDROMORPHONE HCL 2 MG PO TABS: 2.0000 mg | ORAL_TABLET | ORAL | Status: DC | PRN

## 2022-03-03 MED ORDER — RIVAROXABAN 10 MG PO TABS
10.0000 mg | ORAL_TABLET | Freq: Every day | ORAL | Status: DC
  Administered 2022-03-04: 10 mg via ORAL
  Filled 2022-03-03: qty 1

## 2022-03-03 MED ORDER — VANCOMYCIN HCL IN DEXTROSE 1-5 GM/200ML-% IV SOLN
1000.0000 mg | Freq: Two times a day (BID) | INTRAVENOUS | Status: AC
  Administered 2022-03-03: 1000 mg via INTRAVENOUS
  Filled 2022-03-03: qty 200

## 2022-03-03 MED ORDER — TRAMADOL HCL 50 MG PO TABS
50.0000 mg | ORAL_TABLET | Freq: Four times a day (QID) | ORAL | Status: DC
  Administered 2022-03-04 (×4): 50 mg via ORAL
  Filled 2022-03-03 (×5): qty 1

## 2022-03-03 MED ORDER — ONDANSETRON HCL 4 MG/2ML IJ SOLN
INTRAMUSCULAR | Status: DC | PRN
  Administered 2022-03-03: 4 mg via INTRAVENOUS

## 2022-03-03 MED ORDER — MAGNESIUM CITRATE PO SOLN: 1.0000 | Freq: Once | ORAL | Status: DC | PRN

## 2022-03-03 MED ORDER — MIDAZOLAM HCL 2 MG/2ML IJ SOLN
INTRAMUSCULAR | Status: AC
  Filled 2022-03-03: qty 2

## 2022-03-03 MED ORDER — DEXAMETHASONE SODIUM PHOSPHATE 10 MG/ML IJ SOLN
10.0000 mg | Freq: Once | INTRAMUSCULAR | Status: AC
  Administered 2022-03-04: 10 mg via INTRAVENOUS
  Filled 2022-03-03: qty 1

## 2022-03-03 MED ORDER — DOCUSATE SODIUM 100 MG PO CAPS
100.0000 mg | ORAL_CAPSULE | Freq: Two times a day (BID) | ORAL | Status: DC
  Administered 2022-03-03 – 2022-03-04 (×2): 100 mg via ORAL
  Filled 2022-03-03 (×2): qty 1

## 2022-03-03 MED ORDER — METOCLOPRAMIDE HCL 5 MG/ML IJ SOLN: 5.0000 mg | Freq: Three times a day (TID) | INTRAMUSCULAR | Status: DC | PRN

## 2022-03-03 MED ORDER — ONDANSETRON HCL 4 MG/2ML IJ SOLN: 4.0000 mg | Freq: Four times a day (QID) | INTRAMUSCULAR | Status: DC | PRN

## 2022-03-03 MED ORDER — CHLORHEXIDINE GLUCONATE 0.12 % MT SOLN
15.0000 mL | Freq: Once | OROMUCOSAL | Status: AC
  Administered 2022-03-03: 15 mL via OROMUCOSAL

## 2022-03-03 MED ORDER — MIDAZOLAM HCL 2 MG/2ML IJ SOLN
INTRAMUSCULAR | Status: DC | PRN
  Administered 2022-03-03 (×2): 1 mg via INTRAVENOUS

## 2022-03-03 MED ORDER — POVIDONE-IODINE 10 % EX SWAB: 2.0000 | Freq: Once | CUTANEOUS | Status: DC

## 2022-03-03 MED ORDER — SODIUM CHLORIDE (PF) 0.9 % IJ SOLN
INTRAMUSCULAR | Status: AC
  Filled 2022-03-03: qty 10

## 2022-03-03 MED ORDER — VANCOMYCIN HCL 1500 MG/300ML IV SOLN
1500.0000 mg | INTRAVENOUS | Status: AC
  Administered 2022-03-03: 1500 mg via INTRAVENOUS
  Filled 2022-03-03: qty 300

## 2022-03-03 MED ORDER — PHENOL 1.4 % MT LIQD: 1.0000 | OROMUCOSAL | Status: DC | PRN

## 2022-03-03 MED ORDER — ATORVASTATIN CALCIUM 40 MG PO TABS
40.0000 mg | ORAL_TABLET | Freq: Every day | ORAL | Status: DC
  Administered 2022-03-04: 40 mg via ORAL
  Filled 2022-03-03: qty 1

## 2022-03-03 MED ORDER — WATER FOR IRRIGATION, STERILE IR SOLN
Status: DC | PRN
  Administered 2022-03-03: 2000 mL

## 2022-03-03 MED ORDER — FENTANYL CITRATE (PF) 250 MCG/5ML IJ SOLN
INTRAMUSCULAR | Status: DC | PRN
  Administered 2022-03-03 (×2): 50 ug via INTRAVENOUS

## 2022-03-03 MED ORDER — FENTANYL CITRATE PF 50 MCG/ML IJ SOSY: 25.0000 ug | PREFILLED_SYRINGE | INTRAMUSCULAR | Status: DC | PRN

## 2022-03-03 MED ORDER — SODIUM CHLORIDE (PF) 0.9 % IJ SOLN
INTRAMUSCULAR | Status: DC | PRN
  Administered 2022-03-03: 10 mL

## 2022-03-03 MED ORDER — HYDROMORPHONE HCL 1 MG/ML IJ SOLN: 0.5000 mg | INTRAMUSCULAR | Status: DC | PRN

## 2022-03-03 MED ORDER — PROPOFOL 500 MG/50ML IV EMUL
INTRAVENOUS | Status: AC
  Filled 2022-03-03: qty 200

## 2022-03-03 MED ORDER — PHENYLEPHRINE HCL-NACL 20-0.9 MG/250ML-% IV SOLN
INTRAVENOUS | Status: DC | PRN
  Administered 2022-03-03: 35 ug/min via INTRAVENOUS

## 2022-03-03 MED ORDER — BUPIVACAINE IN DEXTROSE 0.75-8.25 % IT SOLN
INTRATHECAL | Status: DC | PRN
  Administered 2022-03-03: 1.8 mL via INTRATHECAL

## 2022-03-03 MED ORDER — LATANOPROST 0.005 % OP SOLN
1.0000 [drp] | Freq: Every day | OPHTHALMIC | Status: DC
  Administered 2022-03-03: 1 [drp] via OPHTHALMIC
  Filled 2022-03-03: qty 2.5

## 2022-03-03 MED ORDER — LACTATED RINGERS IV SOLN: INTRAVENOUS | Status: DC

## 2022-03-03 MED ORDER — POVIDONE-IODINE 10 % EX SWAB
2.0000 | Freq: Once | CUTANEOUS | Status: AC
  Administered 2022-03-03: 2 via TOPICAL

## 2022-03-03 MED ORDER — ALUM & MAG HYDROXIDE-SIMETH 200-200-20 MG/5ML PO SUSP: 30.0000 mL | ORAL | Status: DC | PRN

## 2022-03-03 MED ORDER — ACETAMINOPHEN 10 MG/ML IV SOLN
INTRAVENOUS | Status: AC
  Filled 2022-03-03: qty 100

## 2022-03-03 MED ORDER — BUPIVACAINE LIPOSOME 1.3 % IJ SUSP
INTRAMUSCULAR | Status: DC | PRN
  Administered 2022-03-03: 10 mL

## 2022-03-03 MED ORDER — BUPIVACAINE LIPOSOME 1.3 % IJ SUSP
INTRAMUSCULAR | Status: AC
  Filled 2022-03-03: qty 10

## 2022-03-03 MED ORDER — DAPAGLIFLOZIN PROPANEDIOL 5 MG PO TABS
5.0000 mg | ORAL_TABLET | Freq: Every morning | ORAL | Status: DC
  Administered 2022-03-04: 5 mg via ORAL
  Filled 2022-03-03: qty 1

## 2022-03-03 MED ORDER — OXYCODONE HCL 5 MG PO TABS
20.0000 mg | ORAL_TABLET | Freq: Four times a day (QID) | ORAL | Status: DC
  Administered 2022-03-03 – 2022-03-04 (×5): 20 mg via ORAL
  Filled 2022-03-03 (×5): qty 4

## 2022-03-03 MED ORDER — ACETAMINOPHEN 10 MG/ML IV SOLN
1000.0000 mg | Freq: Once | INTRAVENOUS | Status: DC | PRN
  Administered 2022-03-03: 1000 mg via INTRAVENOUS

## 2022-03-03 SURGICAL SUPPLY — 48 items
BAG COUNTER SPONGE SURGICOUNT (BAG) IMPLANT
BAG ZIPLOCK 12X15 (MISCELLANEOUS) IMPLANT
BLADE SAG 18X100X1.27 (BLADE) ×1 IMPLANT
BLADE SURG SZ10 CARB STEEL (BLADE) ×1 IMPLANT
CATH FOLEY 2WAY SLVR  5CC 12FR (CATHETERS) ×1
CATH FOLEY 2WAY SLVR 5CC 12FR (CATHETERS) IMPLANT
CHLORAPREP W/TINT 26 (MISCELLANEOUS) ×1 IMPLANT
CLSR STERI-STRIP ANTIMIC 1/2X4 (GAUZE/BANDAGES/DRESSINGS) ×1 IMPLANT
COVER PERINEAL POST (MISCELLANEOUS) ×1 IMPLANT
COVER SURGICAL LIGHT HANDLE (MISCELLANEOUS) ×1 IMPLANT
DRAPE IMP U-DRAPE 54X76 (DRAPES) ×1 IMPLANT
DRAPE STERI IOBAN 125X83 (DRAPES) ×1 IMPLANT
DRAPE U-SHAPE 47X51 STRL (DRAPES) ×2 IMPLANT
DRSG MEPILEX POST OP 4X8 (GAUZE/BANDAGES/DRESSINGS) ×1 IMPLANT
ELECT REM PT RETURN 15FT ADLT (MISCELLANEOUS) ×1 IMPLANT
FACESHIELD WRAPAROUND (MASK) ×1 IMPLANT
FACESHIELD WRAPAROUND OR TEAM (MASK) IMPLANT
GLOVE BIO SURGEON STRL SZ7.5 (GLOVE) ×1 IMPLANT
GLOVE BIOGEL PI IND STRL 7.5 (GLOVE) ×1 IMPLANT
GLOVE BIOGEL PI IND STRL 8 (GLOVE) ×1 IMPLANT
GLOVE SURG SYN 7.5  E (GLOVE) ×1
GLOVE SURG SYN 7.5 E (GLOVE) ×1 IMPLANT
GLOVE SURG SYN 7.5 PF PI (GLOVE) ×1 IMPLANT
GOWN STRL REUS W/ TWL LRG LVL3 (GOWN DISPOSABLE) ×1 IMPLANT
GOWN STRL REUS W/ TWL XL LVL3 (GOWN DISPOSABLE) ×1 IMPLANT
GOWN STRL REUS W/TWL LRG LVL3 (GOWN DISPOSABLE) ×1
GOWN STRL REUS W/TWL XL LVL3 (GOWN DISPOSABLE)
HEAD BIOLOX HIP 36/-2.5 (Joint) IMPLANT
HIP BIOLOX HD 36/-2.5 (Joint) ×1 IMPLANT
HOLDER FOLEY CATH W/STRAP (MISCELLANEOUS) IMPLANT
INSERT TRIDENT POLY 36 0DEG (Insert) IMPLANT
KIT TURNOVER KIT A (KITS) IMPLANT
MANIFOLD NEPTUNE II (INSTRUMENTS) ×1 IMPLANT
NS IRRIG 1000ML POUR BTL (IV SOLUTION) ×1 IMPLANT
PACK ANTERIOR HIP CUSTOM (KITS) ×1 IMPLANT
PROTECTOR NERVE ULNAR (MISCELLANEOUS) ×1 IMPLANT
SCREW HEX LP 6.5X20 (Screw) IMPLANT
SHELL CLUSTERHOLE ACETABULAR 5 (Shell) IMPLANT
SPIKE FLUID TRANSFER (MISCELLANEOUS) ×2 IMPLANT
STEM HIP 4 127DEG (Stem) IMPLANT
SUT MNCRL AB 3-0 PS2 18 (SUTURE) ×1 IMPLANT
SUT VIC AB 0 CT1 36 (SUTURE) ×1 IMPLANT
SUT VIC AB 1 CT1 36 (SUTURE) ×1 IMPLANT
SUT VIC AB 2-0 CT1 27 (SUTURE) ×2
SUT VIC AB 2-0 CT1 TAPERPNT 27 (SUTURE) ×2 IMPLANT
TRAY FOLEY MTR SLVR 16FR STAT (SET/KITS/TRAYS/PACK) IMPLANT
TUBE SUCTION HIGH CAP CLEAR NV (SUCTIONS) ×1 IMPLANT
WATER STERILE IRR 1000ML POUR (IV SOLUTION) ×2 IMPLANT

## 2022-03-03 NOTE — Discharge Instructions (Addendum)
POST-OPERATIVE OPIOID TAPER INSTRUCTIONS: It is important to wean off of your opioid medication as soon as possible. If you do not need pain medication after your surgery it is ok to stop day one. Opioids include: Codeine, Hydrocodone(Norco, Vicodin), Oxycodone(Percocet, oxycontin) and hydromorphone amongst others.  Long term and even short term use of opiods can cause: Increased pain response Dependence Constipation Depression Respiratory depression And more.  Withdrawal symptoms can include Flu like symptoms Nausea, vomiting And more Techniques to manage these symptoms Hydrate well Eat regular healthy meals Stay active Use relaxation techniques(deep breathing, meditating, yoga) Do Not substitute Alcohol to help with tapering If you have been on opioids for less than two weeks and do not have pain than it is ok to stop all together.  Plan to wean off of opioids This plan should start within one week post op of your joint replacement. Maintain the same interval or time between taking each dose and first decrease the dose.  Cut the total daily intake of opioids by one tablet each day Next start to increase the time between doses. The last dose that should be eliminated is the evening dose.     Information on my medicine - XARELTO (Rivaroxaban)  Why was Xarelto prescribed for you? Xarelto was prescribed for you to reduce the risk of blood clots forming after orthopedic surgery. The medical term for these abnormal blood clots is venous thromboembolism (VTE).  What do you need to know about xarelto ? Take your Xarelto ONCE DAILY at the same time every day. You may take it either with or without food.  If you have difficulty swallowing the tablet whole, you may crush it and mix in applesauce just prior to taking your dose.  Take Xarelto exactly as prescribed by your doctor and DO NOT stop taking Xarelto without talking to the doctor who prescribed the medication.  Stopping  without other VTE prevention medication to take the place of Xarelto may increase your risk of developing a clot.  After discharge, you should have regular check-up appointments with your healthcare provider that is prescribing your Xarelto.    What do you do if you miss a dose? If you miss a dose, take it as soon as you remember on the same day then continue your regularly scheduled once daily regimen the next day. Do not take two doses of Xarelto on the same day.   Important Safety Information A possible side effect of Xarelto is bleeding. You should call your healthcare provider right away if you experience any of the following: Bleeding from an injury or your nose that does not stop. Unusual colored urine (red or dark brown) or unusual colored stools (red or black). Unusual bruising for unknown reasons. A serious fall or if you hit your head (even if there is no bleeding).  Some medicines may interact with Xarelto and might increase your risk of bleeding while on Xarelto. To help avoid this, consult your healthcare provider or pharmacist prior to using any new prescription or non-prescription medications, including herbals, vitamins, non-steroidal anti-inflammatory drugs (NSAIDs) and supplements.  This website has more information on Xarelto: www.xarelto.com.   

## 2022-03-03 NOTE — Progress Notes (Signed)
PT refused CPAP.  

## 2022-03-03 NOTE — Transfer of Care (Signed)
Immediate Anesthesia Transfer of Care Note  Patient: [name]  Procedure(s) Performed: TOTAL HIP ARTHROPLASTY ANTERIOR APPROACH (Right: Hip)  Patient Location: PACU  Anesthesia Type:General  Level of Consciousness: awake  Airway & Oxygen Therapy: Patient Spontanous Breathing and Patient connected to face mask oxygen  Post-op Assessment: Report given to RN and Post -op Vital signs reviewed and stable  Post vital signs: Reviewed and stable  Last Vitals:  Vitals Value Taken Time  BP 142/74 03/03/22 1320  Temp    Pulse 78 03/03/22 1323  Resp 13 03/03/22 1323  SpO2 100 % 03/03/22 1323  Vitals shown include unvalidated device data.  Last Pain:  Vitals:   03/03/22 0740  TempSrc: Oral         Complications: No notable events documented.

## 2022-03-03 NOTE — Interval H&P Note (Signed)
History and Physical Interval Note:  03/03/2022 9:35 AM  [name]  has presented today for surgery, with the diagnosis of OA RIGHT HIP.  The various methods of treatment have been discussed with the patient and family. After consideration of risks, benefits and other options for treatment, the patient has consented to  Procedure(s): TOTAL HIP ARTHROPLASTY ANTERIOR APPROACH (Right) as a surgical intervention.  The patient's history has been reviewed, patient examined, no change in status, stable for surgery.  I have reviewed the patient's chart and labs.  Questions were answered to the patient's satisfaction.     Renette Butters

## 2022-03-03 NOTE — Anesthesia Procedure Notes (Addendum)
Spinal  Patient location during procedure: OR Start time: 03/03/2022 10:41 AM End time: 03/03/2022 10:43 AM Staffing Performed: anesthesiologist  Anesthesiologist: Darral Dash, DO Performed by: Darral Dash, DO Authorized by: Darral Dash, DO   Preanesthetic Checklist Completed: patient identified, IV checked, site marked, risks and benefits discussed, surgical consent, monitors and equipment checked, pre-op evaluation and timeout performed Spinal Block Patient position: sitting Prep: DuraPrep Patient monitoring: heart rate, cardiac monitor, continuous pulse ox and blood pressure Approach: midline Location: L3-4 Injection technique: single-shot Needle Needle type: Pencan  Needle gauge: 24 G Needle length: 10 cm Assessment Events: CSF return Additional Notes Patient identified. Risks/Benefits/Options discussed with patient including but not limited to bleeding, infection, nerve damage, paralysis, failed block, incomplete pain control, headache, blood pressure changes, nausea, vomiting, reactions to medications, itching and postpartum back pain. Confirmed with bedside nurse the patient's most recent platelet count. Confirmed with patient that they are not currently taking any anticoagulation, have any bleeding history or any family history of bleeding disorders. Patient expressed understanding and wished to proceed. All questions were answered. Sterile technique was used throughout the entire procedure. Please see nursing notes for vital signs. Warning signs of high block given to the patient including shortness of breath, tingling/numbness in hands, complete motor block, or any concerning symptoms with instructions to call for help. Patient was given instructions on fall risk and not to get out of bed. All questions and concerns addressed with instructions to call with any issues or inadequate analgesia.

## 2022-03-03 NOTE — Op Note (Signed)
03/03/2022  1:00 PM  PATIENT:  [name]   MRN: [id number]  PRE-OPERATIVE DIAGNOSIS:  OA RIGHT HIP  POST-OPERATIVE DIAGNOSIS:  OA RIGHT HIP  PROCEDURE:  Procedure(s): TOTAL HIP ARTHROPLASTY ANTERIOR APPROACH  PREOPERATIVE INDICATIONS:    BEARETT PORCARO is an 51 y.o. male who has a diagnosis of <principal problem not specified> and elected for surgical management after failing conservative treatment.  The risks benefits and alternatives were discussed with the patient including but not limited to the risks of nonoperative treatment, versus surgical intervention including infection, bleeding, nerve injury, periprosthetic fracture, the need for revision surgery, dislocation, leg length discrepancy, blood clots, cardiopulmonary complications, morbidity, mortality, among others, and they were willing to proceed.     OPERATIVE REPORT     SURGEON:   Renette Butters, MD    ASSISTANT:  [name], PA-C, he was present and scrubbed throughout the case, critical for completion in a timely fashion, and for retraction, instrumentation, and closure.     ANESTHESIA:  General    COMPLICATIONS:  None.     COMPONENTS:  [employer]ryker acolade fit femur size 4 with a 36 mm -2.5 head ball and an acetabular shell size 52 with a  polyethylene liner    PROCEDURE IN DETAIL:   The patient was met in the holding area and  identified.  The appropriate hip was identified and marked at the operative site.  The patient was then transported to the OR  and  placed under anesthesia per that record.  At that point, the patient was  placed in the supine position and  secured to the operating room table and all bony prominences padded. He received pre-operative antibiotics    The operative lower extremity was prepped from the iliac crest to the distal leg.  Sterile draping was performed.  Time out was performed prior to incision.      Skin incision was made just 2 cm lateral to the ASIS  extending in line with the  tensor fascia lata. Electrocautery was used to control all bleeders. I dissected down sharply to the fascia of the tensor fascia lata was confirmed that the muscle fibers beneath were running posteriorly. I then incised the fascia over the superficial tensor fascia lata in line with the incision. The fascia was elevated off the anterior aspect of the muscle the muscle was retracted posteriorly and protected throughout the case. I then used electrocautery to incise the tensor fascia lata fascia control and all bleeders. Immediately visible was the fat over top of the anterior neck and capsule.  I removed the anterior fat from the capsule and elevated the rectus muscle off of the anterior capsule. I then removed a large time of capsule. The retractors were then placed over the anterior acetabulum as well as around the superior and inferior neck.  I then made a femoral neck cut. Then used the power corkscrew to remove the femoral head from the acetabulum and thoroughly irrigated the acetabulum. I sized the femoral head.    I then exposed the deep acetabulum, cleared out any tissue including the ligamentum teres.   After adequate visualization, I excised the labrum, and then sequentially reamed.  I then impacted the acetabular implant into place using fluoroscopy for guidance.  Appropriate version and inclination was confirmed clinically matching their bony anatomy, and with fluoroscopy.  I placed a 20 mm screw in the posterior/superio position with an excellent bite.    I then placed the polyethylene liner in  place  I then adducted the leg and released the external rotators from the posterior femur allowing it to be easily delivered up lateral and anterior to the acetabulum for preparation of the femoral canal.    I then prepared the proximal femur using the cookie-cutter and then sequentially reamed and broached.  A trial broach, neck, and head was utilized, and I reduced the hip and used floroscopy to  assess the neck length and femoral implant.  I then impacted the femoral prosthesis into place into the appropriate version. The hip was then reduced and fluoroscopy confirmed appropriate position. Leg lengths were restored.  I then irrigated the hip copiously again with, and repaired the fascia with Vicryl, followed by monocryl for the subcutaneous tissue, Monocryl for the skin, Steri-Strips and sterile gauze. The patient was then awakened and returned to PACU in stable and satisfactory condition. There were no complications.  POST OPERATIVE PLAN: WBAT, DVT px: SCD's/TED, ambulation and chemical dvt px  Timothy Murphy, MD Orthopedic Surgeon [phone]     

## 2022-03-03 NOTE — Plan of Care (Signed)
  Problem: Education: Goal: Knowledge of the prescribed therapeutic regimen will improve Outcome: Progressing   Problem: Activity: Goal: Ability to avoid complications of mobility impairment will improve Outcome: Progressing   Problem: Pain Management: Goal: Pain level will decrease with appropriate interventions Outcome: Progressing   Problem: Skin Integrity: Goal: Will show signs of wound healing Outcome: Progressing   Problem: Education: Goal: Knowledge of General Education information will improve Description: Including pain rating scale, medication(s)/side effects and non-pharmacologic comfort measures Outcome: Progressing   Problem: Activity: Goal: Risk for activity intolerance will decrease Outcome: Progressing   Problem: Nutrition: Goal: Adequate nutrition will be maintained Outcome: Progressing   Problem: Elimination: Goal: Will not experience complications related to bowel motility Outcome: Progressing   Problem: Pain Managment: Goal: General experience of comfort will improve Outcome: Progressing

## 2022-03-03 NOTE — Anesthesia Preprocedure Evaluation (Signed)
Anesthesia Evaluation  Patient identified by MRN, date of birth, ID band Patient awake    Reviewed: Allergy & Precautions, NPO status , Patient's Chart, lab work & pertinent test results  History of Anesthesia Complications (+) PONV and history of anesthetic complications  Airway Mallampati: II  TM Distance: >3 FB Neck ROM: Full    Dental no notable dental hx.    Pulmonary sleep apnea ,    Pulmonary exam normal        Cardiovascular hypertension, Pt. on medications and Pt. on home beta blockers + dysrhythmias  Rhythm:Regular Rate:Normal     Neuro/Psych Depression CVA, No Residual Symptoms    GI/Hepatic Neg liver ROS, GERD  ,  Endo/Other  diabetes  Renal/GU CRFRenal disease  negative genitourinary   Musculoskeletal  (+) Arthritis , Osteoarthritis,    Abdominal Normal abdominal exam  (+)   Peds  Hematology  (+) Blood dyscrasia, anemia ,   Anesthesia Other Findings   Reproductive/Obstetrics                             Anesthesia Physical Anesthesia Plan  ASA: 3  Anesthesia Plan: MAC and Spinal   Post-op Pain Management:    Induction: Intravenous  PONV Risk Score and Plan: 2 and Ondansetron, Dexamethasone, Midazolam, Treatment may vary due to age or medical condition and Propofol infusion  Airway Management Planned: Simple Face Mask, Natural Airway and Nasal Cannula  Additional Equipment: None  Intra-op Plan:   Post-operative Plan:   Informed Consent: I have reviewed the patients History and Physical, chart, labs and discussed the procedure including the risks, benefits and alternatives for the proposed anesthesia with the patient or authorized representative who has indicated his/her understanding and acceptance.     Dental advisory given  Plan Discussed with: CRNA  Anesthesia Plan Comments: (Lab Results      Component                Value               Date                       WBC                      8.8                 02/18/2022                HGB                      14.8                02/18/2022                HCT                      44.7                02/18/2022                MCV                      94.7                02/18/2022                PLT  214                 02/18/2022           Lab Results      Component                Value               Date                      NA                       138                 02/18/2022                K                        4.2                 02/18/2022                CO2                      25                  02/18/2022                GLUCOSE                  142 (H)             02/18/2022                BUN                      31 (H)              02/18/2022                CREATININE               1.44 (H)            02/18/2022                CALCIUM                  9.6                 02/18/2022                EGFR                     48 (L)              10/17/2021                GFRNONAA                 58 (L)              02/18/2022          )        Anesthesia Quick Evaluation

## 2022-03-03 NOTE — Anesthesia Postprocedure Evaluation (Addendum)
Anesthesia Post Note  Patient: [name]  Procedure(s) Performed: TOTAL HIP ARTHROPLASTY ANTERIOR APPROACH (Right: Hip)     Patient location during evaluation: PACU Anesthesia Type: Spinal Level of consciousness: awake and alert Pain management: pain level controlled Vital Signs Assessment: post-procedure vital signs reviewed and stable Respiratory status: spontaneous breathing, nonlabored ventilation and respiratory function stable Cardiovascular status: blood pressure returned to baseline Postop Assessment: no apparent nausea or vomiting Anesthetic complications: no   No notable events documented.  Last Vitals:  Vitals:   03/03/22 1445 03/03/22 1545  BP: 115/79 132/67  Pulse: (!) 57 69  Resp: 14 10  Temp:    SpO2: 100% 99%    Last Pain:  Vitals:   03/03/22 1545  TempSrc:   PainSc: Asleep                 Marthenia Rolling

## 2022-03-03 NOTE — Evaluation (Signed)
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   Clinical Impression  [name] is a 51 y.o. male POD 0 s/p R-THAAA. Patient reports modified independence with mobility using a variety of devices including RW, 4WRW, and walking stick at baseline. Patient is now limited by functional impairments (see PT problem list below) and requires supervision for bed mobility and min guard for transfers. Patient was able to ambulate 25 feet with RW and min guard level of assist. Patient instructed in exercise to facilitate ROM and circulation to manage edema. Provided incentive spirometer and with Vcs pt able to achieve 2037m. Patient will benefit from continued skilled PT interventions to address impairments and progress towards PLOF. Acute PT will follow to progress mobility and HEP in preparation for safe discharge home.       Recommendations for follow up therapy are one component of a multi-disciplinary discharge planning process, led by the attending physician.  Recommendations may be updated based on patient status, additional functional criteria and insurance authorization.  Follow Up Recommendations Follow physician's recommendations for discharge plan and follow up therapies      Assistance Recommended at Discharge Intermittent Supervision/Assistance  Patient can return home with the following  A little help with walking and/or transfers;A little help with bathing/dressing/bathroom;Assistance with cooking/housework;Assist for transportation;Help with stairs or ramp for entrance    Equipment Recommendations None recommended by PT  Recommendations for Other Services       Functional Status Assessment Patient has had a recent  decline in their functional status and demonstrates the ability to make significant improvements in function in a reasonable and predictable amount of time.     Precautions / Restrictions Precautions Precautions: Fall Restrictions Weight Bearing Restrictions: No Other Position/Activity Restrictions: WBAT      Mobility  Bed Mobility Overal bed mobility: Needs Assistance Bed Mobility: Supine to Sit     Supine to sit: Supervision, HOB elevated     General bed mobility comments: Supervision for safety, HOB elevated, pt with HEAVY use of bed rail to bring hips to EOB.    Transfers Overall transfer level: Needs assistance Equipment used: Rolling walker (2 wheels) Transfers: Sit to/from Stand Sit to Stand: Min guard, From elevated surface           General transfer comment: Min guard from elevated surface, VCs for hand placement and powering up from bed and LLE.    Ambulation/Gait Ambulation/Gait assistance: Min guard Gait Distance (Feet): 25 Feet Assistive device: Rolling walker (2 wheels) Gait Pattern/deviations: Step-to pattern, Trunk flexed Gait velocity: decreased     General Gait Details: Pt ambulated with RW and min guard, no physical assist required or overt LOB noted. Pt demonstrated trunk flexed that he corrected with cuing, mostly keeping his head down unless cued otherwise.  Stairs            Wheelchair Mobility    Modified Rankin (Stroke Patients Only)       Balance Overall balance assessment: Needs assistance Sitting-balance support: Feet supported, No upper extremity supported Sitting balance-Leahy Scale: Good     Standing balance support: Reliant on assistive device for balance, During functional activity, Bilateral upper extremity supported Standing balance-Leahy Scale: Poor  Pertinent Vitals/Pain Pain Assessment Pain Assessment: 0-10 Pain Score: 3  Pain Location: right hip Pain Descriptors  / Indicators: Operative site guarding Pain Intervention(s): Limited activity within patient's tolerance, Monitored during session, Repositioned, Ice applied    Home Living Family/patient expects to be discharged to:: Private residence Living Arrangements: Alone Available Help at Discharge: Family;Available PRN/intermittently (Mother and Elenor Legato can provide supervision but not physical assistance) Type of Home: Apartment Home Access: Level entry       Home Layout: One level Home Equipment: Rollator (4 wheels);Grab bars - tub/shower;Hand held shower head;Cane - single point;Rolling Walker (2 wheels)      Prior Function Prior Level of Function : Independent/Modified Independent;Driving             Mobility Comments: RW (community mobility) and rollator (household mobility) and SPC intermittently ADLs Comments: ind     Journalist, newspaper        Extremity/Trunk Assessment   Upper Extremity Assessment Upper Extremity Assessment: Overall WFL for tasks assessed    Lower Extremity Assessment Lower Extremity Assessment: RLE deficits/detail;LLE deficits/detail RLE Deficits / Details: MMT ank DF/PF 5/5 RLE Sensation: WNL LLE Deficits / Details: MMT ank DF/PF 5/5 LLE Sensation: WNL    Cervical / Trunk Assessment Cervical / Trunk Assessment: Kyphotic  Communication   Communication: No difficulties  Cognition Arousal/Alertness: Awake/alert Behavior During Therapy: WFL for tasks assessed/performed Overall Cognitive Status: Within Functional Limits for tasks assessed                                          General Comments      Exercises Total Joint Exercises Ankle Circles/Pumps: AROM, Both, 10 reps   Assessment/Plan    PT Assessment Patient needs continued PT services  PT Problem List Decreased strength;Decreased range of motion;Decreased activity tolerance;Decreased balance;Decreased mobility;Decreased coordination;Pain       PT Treatment Interventions  DME instruction;Gait training;Stair training;Functional mobility training;Therapeutic activities;Therapeutic exercise;Balance training;Patient/family education;Neuromuscular re-education    PT Goals (Current goals can be found in the Care Plan section)  Acute Rehab PT Goals Patient Stated Goal: Walking better PT Goal Formulation: With patient Time For Goal Achievement: 03/10/22 Potential to Achieve Goals: Good    Frequency 7X/week     Co-evaluation               AM-PAC PT "6 Clicks" Mobility  Outcome Measure Help needed turning from your back to your side while in a flat bed without using bedrails?: None Help needed moving from lying on your back to sitting on the side of a flat bed without using bedrails?: A Little Help needed moving to and from a bed to a chair (including a wheelchair)?: A Little Help needed standing up from a chair using your arms (e.g., wheelchair or bedside chair)?: A Little Help needed to walk in hospital room?: A Little Help needed climbing 3-5 steps with a railing? : A Little 6 Click Score: 19    End of Session Equipment Utilized During Treatment: Gait belt Activity Tolerance: Patient tolerated treatment well;No increased pain Patient left: in chair;with call bell/phone within reach;with chair alarm set Nurse Communication: Mobility status PT Visit Diagnosis: Pain;Difficulty in walking, not elsewhere classified (R26.2) Pain - Right/Left: Right Pain - part of body: Hip    Time: 1962-2297 PT Time Calculation (min) (ACUTE ONLY): 22 min   Charges:   PT Evaluation $PT Eval Low Complexity: 1  [place], PT, [facility] Office: [phone] Weekend pager: [phone]  Coolidge Breeze 03/03/2022, 7:53 PM

## 2022-03-04 ENCOUNTER — Encounter (HOSPITAL_COMMUNITY): Payer: Self-pay | Admitting: Orthopedic Surgery

## 2022-03-04 DIAGNOSIS — M1611 Unilateral primary osteoarthritis, right hip: Secondary | ICD-10-CM | POA: Diagnosis not present

## 2022-03-04 MED ORDER — OXYCODONE HCL 5 MG PO TABS: 5.0000 mg | ORAL_TABLET | Freq: Three times a day (TID) | ORAL | 0 refills | Status: DC | PRN

## 2022-03-04 MED ORDER — DOCUSATE SODIUM 100 MG PO CAPS: 100.0000 mg | ORAL_CAPSULE | Freq: Two times a day (BID) | ORAL | 0 refills | Status: DC | PRN

## 2022-03-04 MED ORDER — ONDANSETRON 4 MG PO TBDP: 4.0000 mg | ORAL_TABLET | Freq: Three times a day (TID) | ORAL | 0 refills | Status: DC | PRN

## 2022-03-04 MED ORDER — METHOCARBAMOL 750 MG PO TABS: 750.0000 mg | ORAL_TABLET | Freq: Three times a day (TID) | ORAL | 0 refills | Status: DC | PRN

## 2022-03-04 MED ORDER — RIVAROXABAN 10 MG PO TABS: 10.0000 mg | ORAL_TABLET | Freq: Every day | ORAL | 0 refills | Status: DC

## 2022-03-04 NOTE — Progress Notes (Signed)
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    PT Comments    Progressing well with PT. Will see for a second session and likely will be ready for d/c later today    Recommendations for follow up therapy are one component of a multi-disciplinary discharge planning process, led by the attending physician.  Recommendations may be updated based on patient status, additional functional criteria and insurance authorization.  Follow Up Recommendations  Follow physician's recommendations for discharge plan and follow up therapies     Assistance Recommended at Discharge Intermittent Supervision/Assistance  Patient can return home with the following A little help with walking and/or transfers;A little help with bathing/dressing/bathroom;Assistance with cooking/housework;Assist for transportation;Help with stairs or ramp for entrance   Equipment Recommendations  None recommended by PT    Recommendations for Other Services       Precautions / Restrictions Precautions Precautions: Fall Restrictions Weight Bearing Restrictions: No Other Position/Activity Restrictions: WBAT     Mobility  Bed Mobility               General bed mobility comments: in recliner    Transfers Overall transfer level: Needs assistance Equipment used: Rolling walker (2 wheels) Transfers: Sit to/from Stand Sit to Stand: Supervision           General transfer comment: cues for hand placement and RLE position    Ambulation/Gait Ambulation/Gait assistance: Min guard Gait Distance (Feet): 160 Feet Assistive device: Rolling walker (2 wheels) Gait Pattern/deviations: Step-to pattern, Trunk flexed       General Gait Details: cues for  initial sequence and RW position, pt maintains downward gaze althouhg corrects with cues   Stairs             Wheelchair Mobility    Modified Rankin (Stroke Patients Only)       Balance           Standing balance support: Reliant on assistive device for balance, During functional activity, Bilateral upper extremity supported Standing balance-Leahy Scale: Poor                              Cognition Arousal/Alertness: Awake/alert Behavior During Therapy: WFL for tasks assessed/performed Overall Cognitive Status: Within Functional Limits for tasks assessed                                          Exercises      General Comments        Pertinent Vitals/Pain Pain Assessment Pain Assessment: 0-10 Pain Score: 3  Pain Location: right hip Pain Descriptors / Indicators: Operative site guarding Pain Intervention(s): Limited activity within patient's tolerance, Monitored during session    Home Living                          Prior Function            PT Goals (current goals can now be found in the care plan section) Acute Rehab PT Goals Patient Stated Goal: Walking better PT Goal Formulation: With patient Time For Goal Achievement:  03/10/22 Potential to Achieve Goals: Good Progress towards PT goals: Progressing toward goals    Frequency    7X/week      PT Plan Current plan remains appropriate    Co-evaluation              AM-PAC PT "6 Clicks" Mobility   Outcome Measure  Help needed turning from your back to your side while in a flat bed without using bedrails?: None Help needed moving from lying on your back to sitting on the side of a flat bed without using bedrails?: A Little Help needed moving to and from a bed to a chair (including a wheelchair)?: A Little Help needed standing up from a chair using your arms (e.g., wheelchair or bedside chair)?: A Little Help needed to walk in hospital room?: A  Little Help needed climbing 3-5 steps with a railing? : A Little 6 Click Score: 19    End of Session Equipment Utilized During Treatment: Gait belt Activity Tolerance: Patient tolerated treatment well;No increased pain Patient left: in chair;with call bell/phone within reach;with chair alarm set Nurse Communication: Mobility status PT Visit Diagnosis: Pain;Difficulty in walking, not elsewhere classified (R26.2) Pain - Right/Left: Right Pain - part of body: Hip     Time: 4356-8616 PT Time Calculation (min) (ACUTE ONLY): 10 min  Charges:  $Gait Training: 8-22 mins                     Baxter Flattery, PT  Acute Rehab Dept [facility]) [phone]  [facility] Weekend Pager ([place] only)  [phone]  03/04/2022    [facility] 03/04/2022, 1:37 PM

## 2022-03-04 NOTE — Discharge Summary (Signed)
Physician Discharge Summary  Patient ID: [name] MRN: [id number] DOB/AGE: [birth year] 51 y.o.  Admit date: 03/03/2022 Discharge date: 03/04/2022  Admission Diagnoses: right hip OA  Discharge Diagnoses:  Principal Problem:   S/P total right hip arthroplasty   Discharged Condition: fair  Hospital Course: Patient underwent a right THA anterior approach with Dr. Percell Miller that was without complications. He spent the night in observation for pain control and mobilization. He has passed his PT evaluations and is ready for discharge home.   Consults: None  Significant Diagnostic Studies: n/a  Treatments: IV hydration, antibiotics: vancomycin, analgesia: acetaminophen, Dilaudid, Tramadol, and Oxycodone, anticoagulation: Xarelto, therapies: PT, OT, and SW, and surgery: right THA  Discharge Exam: Blood pressure 116/69, pulse 77, temperature 98.2 F (36.8 C), resp. rate 18, height '5\' 8"'  (1.727 m), weight 119.4 kg, SpO2 98 %. General appearance: alert, cooperative, and no distress Head: Normocephalic, without obvious abnormality, atraumatic Resp: clear to auscultation bilaterally Cardio: regular rate and rhythm, S1, S2 normal, no murmur, click, rub or gallop Extremities: extremities normal, atraumatic, no cyanosis or edema Pulses:  L brachial 2+ R brachial 2+  L radial 2+ R radial 2+  L inguinal 2+ R inguinal 2+  L popliteal 2+ R popliteal 2+  L posterior tibial 2+ R posterior tibial 2+  L dorsalis pedis 2+ R dorsalis pedis 2+   Neurologic: Grossly normal Incision/Wound: c/d/i  Disposition: Discharge disposition: 01-Home or Self Care       Discharge Instructions     Call MD / Call 911   Complete by: As directed    If you experience chest pain or shortness of breath, CALL 911 and be transported to the hospital emergency room.  If you develope a fever above 101 F, pus (white drainage) or increased drainage or redness at the wound, or calf pain, call your surgeon's office.    Diet - low sodium heart healthy   Complete by: As directed    Discharge instructions   Complete by: As directed    You may bear weight as tolerated. Keep your dressing on and dry until follow up. Take medicine to prevent blood clots as directed. Take pain medicine as needed with the goal of transitioning to over the counter medicines.    INSTRUCTIONS AFTER JOINT REPLACEMENT   Remove items at home which could result in a fall. This includes throw rugs or furniture in walking pathways ICE to the affected joint every three hours while awake for 30 minutes at a time, for at least the first 3-5 days, and then as needed for pain and swelling.  Continue to use ice for pain and swelling. You may notice swelling that will progress down to the foot and ankle.  This is normal after surgery.  Elevate your leg when you are not up walking on it.   Continue to use the breathing machine you got in the hospital (incentive spirometer) which will help keep your temperature down.  It is common for your temperature to cycle up and down following surgery, especially at night when you are not up moving around and exerting yourself.  The breathing machine keeps your lungs expanded and your temperature down.   DIET:  As you were doing prior to hospitalization, we recommend a well-balanced diet.  DRESSING / WOUND CARE / SHOWERING  You may shower 3 days after surgery, but keep the wounds dry during showering.  You may use an occlusive plastic wrap (Press'n Seal for example) with blue painter's tape  at edges, NO SOAKING/SUBMERGING IN THE BATHTUB.  If the bandage gets wet, call the office.   ACTIVITY  Increase activity slowly as tolerated, but follow the weight bearing instructions below.   No driving for 6 weeks or until further direction given by your physician.  You cannot drive while taking narcotics.  No lifting or carrying greater than 10 lbs. until further directed by your surgeon. Avoid periods of  inactivity such as sitting longer than an hour when not asleep. This helps prevent blood clots.  You may return to work once you are authorized by your doctor.    WEIGHT BEARING   Weight bearing as tolerated with assist device (walker, cane, etc) as directed, use it as long as suggested by your surgeon or therapist, typically at least 4-6 weeks.   EXERCISES  Results after joint replacement surgery are often greatly improved when you follow the exercise, range of motion and muscle strengthening exercises prescribed by your doctor. Safety measures are also important to protect the joint from further injury. Any time any of these exercises cause you to have increased pain or swelling, decrease what you are doing until you are comfortable again and then slowly increase them. If you have problems or questions, call your caregiver or physical therapist for advice.   Rehabilitation is important following a joint replacement. After just a few days of immobilization, the muscles of the leg can become weakened and shrink (atrophy).  These exercises are designed to build up the tone and strength of the thigh and leg muscles and to improve motion. Often times heat used for twenty to thirty minutes before working out will loosen up your tissues and help with improving the range of motion but do not use heat for the first two weeks following surgery (sometimes heat can increase post-operative swelling).   These exercises can be done on a training (exercise) mat, on the floor, on a table or on a bed. Use whatever works the best and is most comfortable for you.    Use music or television while you are exercising so that the exercises are a pleasant break in your day. This will make your life better with the exercises acting as a break in your routine that you can look forward to.   Perform all exercises about fifteen times, three times per day or as directed.  You should exercise both the operative leg and the other  leg as well.  Exercises include:   Quad Sets - Tighten up the muscle on the front of the thigh (Quad) and hold for 5-10 seconds.   Straight Leg Raises - With your knee straight (if you were given a brace, keep it on), lift the leg to 60 degrees, hold for 3 seconds, and slowly lower the leg.  Perform this exercise against resistance later as your leg gets stronger.  Leg Slides: Lying on your back, slowly slide your foot toward your buttocks, bending your knee up off the floor (only go as far as is comfortable). Then slowly slide your foot back down until your leg is flat on the floor again.  Angel Wings: Lying on your back spread your legs to the side as far apart as you can without causing discomfort.  Hamstring Strength:  Lying on your back, push your heel against the floor with your leg straight by tightening up the muscles of your buttocks.  Repeat, but this time bend your knee to a comfortable angle, and push your heel against the  floor.  You may put a pillow under the heel to make it more comfortable if necessary.   A rehabilitation program following joint replacement surgery can speed recovery and prevent re-injury in the future due to weakened muscles. Contact your doctor or a physical therapist for more information on knee rehabilitation.    CONSTIPATION  Constipation is defined medically as fewer than three stools per week and severe constipation as less than one stool per week.  Even if you have a regular bowel pattern at home, your normal regimen is likely to be disrupted due to multiple reasons following surgery.  Combination of anesthesia, postoperative narcotics, change in appetite and fluid intake all can affect your bowels.   YOU MUST use at least one of the following options; they are listed in order of increasing strength to get the job done.  They are all available over the counter, and you may need to use some, POSSIBLY even all of these options:    Drink plenty of fluids  (prune juice may be helpful) and high fiber foods Colace 100 mg by mouth twice a day  Senokot for constipation as directed and as needed Dulcolax (bisacodyl), take with full glass of water  Miralax (polyethylene glycol) once or twice a day as needed.  If you have tried all these things and are unable to have a bowel movement in the first 3-4 days after surgery call either your surgeon or your primary doctor.    If you experience loose stools or diarrhea, hold the medications until you stool forms back up.  If your symptoms do not get better within 1 week or if they get worse, check with your doctor.  If you experience "the worst abdominal pain ever" or develop nausea or vomiting, please contact the office immediately for further recommendations for treatment.   ITCHING:  If you experience itching with your medications, try taking only a single pain pill, or even half a pain pill at a time.  You can also use Benadryl over the counter for itching or also to help with sleep.   TED HOSE STOCKINGS:  Use stockings on both legs until for at least 2 weeks or as directed by physician office. They may be removed at night for sleeping.  MEDICATIONS:  See your medication summary on the "After Visit Summary" that nursing will review with you.  You may have some home medications which will be placed on hold until you complete the course of blood thinner medication.  It is important for you to complete the blood thinner medication as prescribed.  Take medicines as prescribed.   You have several different medicines that work in different ways. - Tylenol is for mild to moderate pain. Try to take this medicine before turning to your narcotic medicines.  - Robaxin is for muscle spasms. This medicine can make you drowsy. - Oxycodone is a narcotic pain medicine.  Take this for severe pain. This medicine can be dehydrating / constipating. - Zofran is for nausea and vomiting. - Colace is for constipation prevention -  Xarelto is to prevent blood clots after surgery. YOU MUST TAKE THIS MEDICINE!  PRECAUTIONS:  If you experience chest pain or shortness of breath - call 911 immediately for transfer to the hospital emergency department.   If you develop a fever greater that 101 F, purulent drainage from wound, increased redness or drainage from wound, foul odor from the wound/dressing, or calf pain - CONTACT YOUR SURGEON.  FOLLOW-UP APPOINTMENTS:  If you do not already have a post-op appointment, please call the office [phone] for an appointment to be seen by Dr. Percell Miller in 2 weeks.   OTHER INSTRUCTIONS:   MAKE SURE YOU:  Understand these instructions.  Get help right away if you are not doing well or get worse.    Thank you for letting us be a part of your medical care team.  It is a privilege we respect greatly.  We hope these instructions will help you stay on track for a fast and full recovery!   Driving restrictions   Complete by: As directed    No driving for 2-4 weeks   Post-operative opioid taper instructions:   Complete by: As directed    POST-OPERATIVE OPIOID TAPER INSTRUCTIONS: It is important to wean off of your opioid medication as soon as possible. If you do not need pain medication after your surgery it is ok to stop day one. Opioids include: Codeine, Hydrocodone(Norco, Vicodin), Oxycodone(Percocet, oxycontin) and hydromorphone amongst others.  Long term and even short term use of opiods can cause: Increased pain response Dependence Constipation Depression Respiratory depression And more.  Withdrawal symptoms can include Flu like symptoms Nausea, vomiting And more Techniques to manage these symptoms Hydrate well Eat regular healthy meals Stay active Use relaxation techniques(deep breathing, meditating, yoga) Do Not substitute Alcohol to help with tapering If you have been on opioids for less than two weeks and do not have  pain than it is ok to stop all together.  Plan to wean off of opioids This plan should start within one week post op of your joint replacement. Maintain the same interval or time between taking each dose and first decrease the dose.  Cut the total daily intake of opioids by one tablet each day Next start to increase the time between doses. The last dose that should be eliminated is the evening dose.      TED hose   Complete by: As directed    Use stockings (TED hose) for 3 weeks on right leg(s).  You may remove them at night for sleeping.   Weight bearing as tolerated   Complete by: As directed    Laterality: right   Extremity: Lower      Allergies as of 03/04/2022   No Known Allergies      Medication List     STOP taking these medications    aspirin EC 81 MG tablet   metaxalone 800 MG tablet Commonly known as: SKELAXIN   tiZANidine 4 MG tablet Commonly known as: Zanaflex       TAKE these medications    acetaminophen 500 MG tablet Commonly known as: TYLENOL Take 2 tablets (1,000 mg total) by mouth every 6 (six) hours as needed for mild pain or moderate pain. What changed: Another medication with the same name was removed. Continue taking this medication, and follow the directions you see here.   ASTEPRO NA Place 1 spray into the nose daily as needed (allergies).   atorvastatin 40 MG tablet Commonly known as: LIPITOR Take 1 tablet (40 mg total) by mouth daily.   B-12 PO Take 1 tablet by mouth daily.   blood glucose meter kit and supplies Kit Test daily Dx  r73.03   Blood Pressure Monitor Kit 1 each by Does not apply route daily.   docusate sodium 100 MG capsule Commonly known as: Colace Take 1 capsule (100 mg total) by mouth 2 (two) times daily as needed for  mild constipation or moderate constipation.   Farxiga 5 MG Tabs tablet Generic drug: dapagliflozin propanediol Take 1 tablet (5 mg total) by mouth every morning.   ferrous sulfate 325 (65 FE)  MG tablet Take 325 mg by mouth every other day.   gabapentin 300 MG capsule Commonly known as: NEURONTIN TAKE (1) CAPSULE BY MOUTH AT BEDTIME.   glucose blood test strip Use as instructed. Can check up to 4 times daily.   Kerendia 10 MG Tabs Generic drug: Finerenone Take 10 mg by mouth daily.   latanoprost 0.005 % ophthalmic solution Commonly known as: XALATAN Place 1 drop into both eyes at bedtime.   loratadine 10 MG tablet Commonly known as: CLARITIN Take 10 mg by mouth daily.   losartan 25 MG tablet Commonly known as: COZAAR Take 1 tablet (25 mg total) by mouth daily.   Melatonin 10 MG Caps Take 10 mg by mouth at bedtime.   methocarbamol 750 MG tablet Commonly known as: Robaxin-750 Take 1 tablet (750 mg total) by mouth every 8 (eight) hours as needed for muscle spasms (DO NOT TAKE WITH OTHER MUSCLE RELAXERS!).   multivitamin tablet Take 1 tablet by mouth daily. One a day   ondansetron 4 MG disintegrating tablet Commonly known as: ZOFRAN-ODT Take 1 tablet (4 mg total) by mouth every 8 (eight) hours as needed for nausea or vomiting. What changed: when to take this   Oxycodone HCl 20 MG Tabs Take 20 mg by mouth 4 (four) times daily as needed (pain). What changed: Another medication with the same name was added. Make sure you understand how and when to take each.   oxyCODONE 5 MG immediate release tablet Commonly known as: Roxicodone Take 1 tablet (5 mg total) by mouth every 8 (eight) hours as needed for severe pain or breakthrough pain (after surgery that is not resolved by your normal daily dose of Oxycodone). What changed: You were already taking a medication with the same name, and this prescription was added. Make sure you understand how and when to take each.   rivaroxaban 10 MG Tabs tablet Commonly known as: Xarelto Take 1 tablet (10 mg total) by mouth daily. To prevent blood clots after surgery.   Semaglutide (2 MG/DOSE) 8 MG/3ML Sopn Inject 2 mg as  directed once a week. What changed: when to take this   sildenafil 50 MG tablet Commonly known as: Viagra Take 1 tablet (50 mg total) by mouth daily as needed for erectile dysfunction.   timolol 0.5 % ophthalmic solution Commonly known as: BETIMOL Place 1 drop into both eyes in the morning.               Discharge Care Instructions  (From admission, onward)           Start     Ordered   03/04/22 0000  Weight bearing as tolerated       Question Answer Comment  Laterality right   Extremity Lower      03/04/22 1746            Follow-up Information     Renette Butters, MD. Go on 03/18/2022.   Specialty: Orthopedic Surgery Why: at 4:15pm Contact information: [address] [place] [address] [phone]                 Signed: Alisa Graff 03/04/2022, 5:47 PM

## 2022-03-04 NOTE — Progress Notes (Signed)
03/04/22 1400  PT Visit Information  Last PT Received On 03/04/22  Assistance Needed Progressing well with PT, meeting goals. Pt is ready to d/c with family assist as needed from PT standpoint.  Pt in agreement, motivated to go home today   History of Present Illness Pt is a 51yo male presenting s/p R-THA, AA on 03/03/22 PMH: chronic back pain, hx of CVA 2006 & 2010, DM, GERD, gout, HLD, HTN, prediabetes, CKD3, L-THA 12/16/21.  Subjective Data  Patient Stated Goal Walking better  Precautions  Precautions Fall  Restrictions  Weight Bearing Restrictions No  Other Position/Activity Restrictions WBAT  Pain Assessment  Pain Assessment 0-10  Pain Score 4  Pain Location right hip  Pain Descriptors / Indicators Sore;Burning  Pain Intervention(s) Limited activity within patient's tolerance;Monitored during session;Premedicated before session;Repositioned  Cognition  Arousal/Alertness Awake/alert  Behavior During Therapy WFL for tasks assessed/performed  Overall Cognitive Status Within Functional Limits for tasks assessed  Bed Mobility  General bed mobility comments in recliner  Transfers  Overall transfer level Needs assistance  Equipment used Rolling walker (2 wheels)  Transfers Sit to/from Stand  Sit to Stand Supervision;Modified independent (Device/Increase time)  General transfer comment self cues for hand placement and RLE position  Ambulation/Gait  Ambulation/Gait assistance Supervision  Gait Distance (Feet) 165 Feet  Assistive device Rolling walker (2 wheels)  Gait Pattern/deviations Step-to pattern;Trunk flexed;Wide base of support  General Gait Details cues for initial sequence and RW position, pt maintains downward gaze although corrects with cues.  Balance  Standing balance support Reliant on assistive device for balance;During functional activity;Bilateral upper extremity supported  Standing balance-Leahy Scale Poor  PT - End of Session  Equipment Utilized During  Treatment Gait belt  Activity Tolerance Patient tolerated treatment well;No increased pain  Patient left in chair;with call bell/phone within reach;with chair alarm set  Nurse Communication Mobility status   PT - Assessment/Plan  PT Plan Current plan remains appropriate  PT Visit Diagnosis Pain;Difficulty in walking, not elsewhere classified (R26.2)  Pain - Right/Left Right  Pain - part of body Hip  PT Frequency (ACUTE ONLY) 7X/week  Follow Up Recommendations Follow physician's recommendations for discharge plan and follow up therapies  Assistance recommended at discharge Intermittent Supervision/Assistance  Patient can return home with the following A little help with walking and/or transfers;A little help with bathing/dressing/bathroom;Assistance with cooking/housework;Assist for transportation;Help with stairs or ramp for entrance  PT equipment None recommended by PT  AM-PAC PT "6 Clicks" Mobility Outcome Measure (Version 2)  Help needed turning from your back to your side while in a flat bed without using bedrails? 4  Help needed moving from lying on your back to sitting on the side of a flat bed without using bedrails? 3  Help needed moving to and from a bed to a chair (including a wheelchair)? 3  Help needed standing up from a chair using your arms (e.g., wheelchair or bedside chair)? 4  Help needed to walk in hospital room? 3  Help needed climbing 3-5 steps with a railing?  3  6 Click Score 20  Consider Recommendation of Discharge To: Home with no services  Progressive Mobility  What is the highest level of mobility based on the progressive mobility assessment? Level 5 (Walks with assist in room/hall) - Balance while stepping forward/back and can walk in room with assist - Complete  Activity Ambulated with assistance in hallway  PT Goal Progression  Progress towards PT goals Progressing toward goals  Acute Rehab PT  Goals  PT Goal Formulation With patient  Time For Goal  Achievement 03/10/22  Potential to Achieve Goals Good  PT Time Calculation  PT Start Time (ACUTE ONLY) 1424  PT Stop Time (ACUTE ONLY) 1436  PT Time Calculation (min) (ACUTE ONLY) 12 min  PT General Charges  $$ ACUTE PT VISIT 1 Visit  PT Treatments  $Gait Training 8-22 mins

## 2022-03-04 NOTE — TOC Transition Note (Signed)
Transition of Care [facility]) - CM/SW Discharge Note   Patient Details  Name: [name] MRN: [id number] Date of Birth: 1970/06/11  [facility] CM/SW Contact:  Lennart Pall, LCSW Phone Number: 03/04/2022, 10:14 AM   Clinical Narrative:     Met with pt and confirming he has all needed DME at home.  OPPT already set up with [place].  No TOC needs.  Final next level of care: OP Rehab Barriers to Discharge: No Barriers Identified   Patient Goals and CMS Choice Patient states their goals for this hospitalization and ongoing recovery are:: return home      Discharge Placement                       Discharge Plan and Services                DME Arranged: N/A DME Agency: NA                  Social Determinants of Health (SDOH) Interventions     Readmission Risk Interventions     No data to display

## 2022-03-04 NOTE — Progress Notes (Signed)
    Subjective: Patient reports pain as moderate. Well controlled with medicine. Tolerating diet.  Urinating.   No CP, SOB.  Has mobilized some OOB with PT.   Objective:   VITALS:   Vitals:   03/03/22 1720 03/03/22 2004 03/04/22 0017 03/04/22 0411  BP: (!) 144/70 115/72 (!) 98/50 113/78  Pulse: 83 94 98 95  Resp: '18 14 15 15  '$ Temp: 98.2 F (36.8 C) 98 F (36.7 C) 98 F (36.7 C) 98.2 F (36.8 C)  TempSrc: Oral Oral Oral Oral  SpO2: 99% 99% 98% 99%  Weight:      Height:          Latest Ref Rng & Units 02/18/2022   10:44 AM 12/04/2021    9:30 AM 05/15/2021    2:25 PM  CBC  WBC 4.0 - 10.5 K/uL 8.8  6.8  8.1   Hemoglobin 13.0 - 17.0 g/dL 14.8  15.5  15.0   Hematocrit 39.0 - 52.0 % 44.7  45.1  44.5   Platelets 150 - 400 K/uL 214  178  215       Latest Ref Rng & Units 02/18/2022   10:44 AM 12/04/2021    9:30 AM 10/17/2021   10:30 AM  BMP  Glucose 70 - 99 mg/dL 142  125  115   BUN 6 - 20 mg/dL '31  25  27   '$ Creatinine 0.61 - 1.24 mg/dL 1.44  1.62  1.69   BUN/Creat Ratio 9 - 20   16   Sodium 135 - 145 mmol/L 138  138  139   Potassium 3.5 - 5.1 mmol/L 4.2  3.9  4.4   Chloride 98 - 111 mmol/L 105  105  100   CO2 22 - 32 mmol/L '25  26  23   '$ Calcium 8.9 - 10.3 mg/dL 9.6  9.6  9.7    Intake/Output      10/10 0701 10/11 0700 10/11 0701 10/12 0700   P.O. 580    I.V. (mL/kg) 1700 (14.2)    IV Piggyback 100    Total Intake(mL/kg) 2380 (19.9)    Urine (mL/kg/hr) 3225    Blood 250    Total Output 3475    Net -1095            Physical Exam: General: NAD.  Sitting up in bedside chair, calm, comfortable Resp: No increased wob Cardio: regular rate and rhythm ABD soft Neurologically intact MSK Neurovascularly intact Sensation intact distally Intact pulses distally Dorsiflexion/Plantar flexion intact Incision: dressing C/D/I   Assessment: 1 Day Post-Op  S/P Procedure(s) (LRB): TOTAL HIP ARTHROPLASTY ANTERIOR APPROACH (Right) by Dr. Ernesta Amble. Murphy on  03/03/22  Principal Problem:   S/P total right hip arthroplasty   Plan:  Advance diet Up with therapy Incentive Spirometry Elevate and Apply ice  Weightbearing: WBAT RLE Insicional and dressing care: Dressings left intact until follow-up and Reinforce dressings as needed Orthopedic device(s): None Showering: Keep dressing dry VTE prophylaxis:  Xarelto '10mg'$  daily   for 30 days post-op , SCDs, ambulation Pain control: continue current regimen Follow - up plan: 2 weeks Contact information:  Edmonia Lynch MD, [name] PA-C  Dispo: Home today once clears PT     Alisa Graff Office [phone] 03/04/2022, 8:48 AM

## 2022-03-05 ENCOUNTER — Other Ambulatory Visit: Payer: Self-pay | Admitting: Nurse Practitioner

## 2022-03-05 ENCOUNTER — Ambulatory Visit (HOSPITAL_COMMUNITY): Payer: Medicaid Other | Attending: Nurse Practitioner | Admitting: Physical Therapy

## 2022-03-05 DIAGNOSIS — M6281 Muscle weakness (generalized): Secondary | ICD-10-CM | POA: Insufficient documentation

## 2022-03-05 DIAGNOSIS — R2689 Other abnormalities of gait and mobility: Secondary | ICD-10-CM | POA: Insufficient documentation

## 2022-03-05 DIAGNOSIS — M25551 Pain in right hip: Secondary | ICD-10-CM | POA: Diagnosis present

## 2022-03-05 NOTE — Therapy (Signed)
OUTPATIENT PHYSICAL THERAPY LOWER EXTREMITY EVALUATION   Patient Name: [name] MRN: [id number] DOB:[birth year], 51 y.o., male Today's Date: 03/05/2022   PT End of Session - 03/05/22 1338     Visit Number 1    Number of Visits 12    Date for PT Re-Evaluation 04/22/22    Authorization Type Medicaid Healthy [employer]    Authorization Time Period requested 12 visits    Progress Note Due on Visit 10    PT Start Time 1305    PT Stop Time 1761    PT Time Calculation (min) 32 min    Activity Tolerance Patient tolerated treatment well;No increased pain    Behavior During Therapy [facility] for tasks assessed/performed             Past Medical History:  Diagnosis Date   ALLERGY 08/03/2008   Qualifier: Diagnosis of  By: Christ Kick     Anemia    Arthritis    "hips" (01/15/2015)   Blood transfusion without reported diagnosis    Cellulitis 07/25/2018   Chronic back pain greater than 3 months duration 02/11/2017   Chronic lower back pain    Colon polyps    COLONIC POLYPS, ADENOMATOUS 08/03/2008   Qualifier: Diagnosis of  By: Christ Kick     CVA 08/03/2008   Qualifier: History of  By: Christ Kick     Depression    denies   Diabetes mellitus    diet controlled- no meds since wt loss   Diabetes mellitus ([place]) 07/10/2019   Dysrhythmia    s/p surgery bariatric- cardioversion   Erectile dysfunction 01/26/2018   GERD (gastroesophageal reflux disease)    HIATAL HERNIA 08/03/2008   Qualifier: Diagnosis of  By: Christ Kick     Hip pain    History of gout    Hyperlipidemia    Hypertension    Joint pain    Narcotic dependence ([place]) 02/11/2017   Neuromuscular disorder ([place])    PONV (postoperative nausea and vomiting)    Prediabetes 03/29/2017   Sleep apnea    no longer have to use cpap since wt loss   Stage 3 chronic kidney disease ([place]) 01/26/2018   Stroke ([place]) 2006   denies residual on 01/15/2015   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 ([place])  08/01/2018   Past Surgical History:  Procedure Laterality Date   BARIATRIC SURGERY  2010   in [place]  02/26/2021   Procedure: BIOPSY;  Surgeon: Harvel Quale, MD;  [facility];  Service: Gastroenterology;;   CARDIOVERSION     COLONOSCOPY N/A 08/02/2014   Procedure: COLONOSCOPY;  Surgeon: Rogene Houston, MD;  [facility];  Service: Endoscopy;  Laterality: N/A;  210 - moved to 3/10 @ 11:55 - Ann notified pt   COLONOSCOPY WITH PROPOFOL N/A 02/26/2021   Procedure: COLONOSCOPY WITH PROPOFOL;  Surgeon: Harvel Quale, MD;  [facility];  Service: Gastroenterology;  Laterality: N/A;  10:00   ESOPHAGOGASTRODUODENOSCOPY     ESOPHAGOGASTRODUODENOSCOPY (EGD) WITH PROPOFOL N/A 02/26/2021   Procedure: ESOPHAGOGASTRODUODENOSCOPY (EGD) WITH PROPOFOL;  Surgeon: Harvel Quale, MD;  [facility];  Service: Gastroenterology;  Laterality: N/A;   HERNIA REPAIR     LIPOSUCTION     PANNICULECTOMY  01/14/2015   PANNICULECTOMY N/A 01/14/2015   Procedure:  TOTAL PANNICULECTOMY WTH LIPOSUCTION OF SIDES;  Surgeon: Cristine Polio, MD;  Location: [place];  Service: Plastics;  Laterality: N/A;   POLYPECTOMY  02/26/2021   Procedure: POLYPECTOMY INTESTINAL;  Surgeon: Harvel Quale, MD;  [facility];  Service: Gastroenterology;;   TONSILLECTOMY     TOTAL HIP ARTHROPLASTY Left 12/16/2021   Procedure: TOTAL HIP ARTHROPLASTY ANTERIOR APPROACH;  Surgeon: Renette Butters, MD;  [facility];  Service: Orthopedics;  Laterality: Left;   TOTAL HIP ARTHROPLASTY Right 03/03/2022   Procedure: TOTAL HIP ARTHROPLASTY ANTERIOR APPROACH;  Surgeon: Renette Butters, MD;  [facility];  Service: Orthopedics;  Laterality: Right;   VENTRAL HERNIA REPAIR  01/14/2015   VENTRAL HERNIA REPAIR N/A 01/14/2015   Procedure: HERNIA REPAIR VENTRAL ADULT AND MUSCLE REPAIR;  Surgeon: Cristine Polio, MD;  Location: [place];  Service:  Plastics;  Laterality: N/A;   Patient Active Problem List   Diagnosis Date Noted   S/P total right hip arthroplasty 03/03/2022   Annual visit for general adult medical examination with abnormal findings 02/17/2022   S/P total left hip arthroplasty 12/16/2021   Need for varicella vaccine 10/16/2021   OSA  09/11/2021   Pre-operative clearance 06/19/2021   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   Immunization due 02/06/2021   Iron deficiency anemia 12/30/2020   History of colonic polyps 12/30/2020   Unilateral primary osteoarthritis, right hip 08/13/2020   Unilateral primary osteoarthritis, left hip 08/13/2020   Muscle spasms of both lower extremities 05/23/2020   Need for immunization against influenza 03/06/2020   Primary osteoarthritis of both hips 07/10/2019   Bilateral primary osteoarthritis of hip 07/10/2019   Vitamin D deficiency 04/13/2019   Morbid obesity with body mass index (BMI) of 40.0 to 44.9 in adult ([place]) 04/13/2019   Depressive disorder 04/13/2019   Body mass index (BMI) 45.0-49.9, adult ([place]) 04/13/2019   Kidney disease, chronic, stage III (GFR 30-59 ml/min) ([place]) 07/20/2017   History of cerebrovascular accident 07/20/2017   Gastroesophageal reflux disease 07/20/2017   Gastric bypass status for obesity 02/11/2017   Benzodiazepine dependence ([place]) 02/11/2017   Type 2 diabetes mellitus with diabetic nephropathy ([place]) 08/03/2008   Hyperlipidemia 08/03/2008   High blood pressure 08/03/2008   SLEEP APNEA 08/03/2008   Sleep apnea 08/03/2008    PCP: Dr. Doren Custard  REFERRING PROVIDER:  Renette Butters, MD  REFERRING DIAG: post op rt THR (DOS 03/03/2022), anterior approach  THERAPY DIAG:  Muscle weakness, Right hip pain, difficulty in walking   Rationale for Evaluation and Treatment Rehabilitation  ONSET DATE: 03/03/22  SUBJECTIVE:   SUBJECTIVE STATEMENT: PT states that he is having difficulty getting out of a  chair, he has not done stairs yet.   PERTINENT HISTORY: DM, HTN, Lt THR, OA   PAIN:  Are you having pain? Yes: NPRS scale: 4/10 Pain location: Rt hip  Pain description: throb Aggravating factors: walking  Relieving factors: rest   PRECAUTIONS: Anterior hip  WEIGHT BEARING RESTRICTIONS No  FALLS:  Has patient fallen in last 6 months? No  LIVING ENVIRONMENT: Lives with: lives with their family Lives in: House/apartment Stairs: No Has following equipment at home: Single point cane, Environmental consultant - 2 wheeled, Electronics engineer, and Grab bars  OCCUPATION: disability   PLOF: Independent with basic ADLs  PATIENT GOALS PT just wants to be able to walk right, go to the gym    OBJECTIVE:    POSTURE: No Significant postural limitations    LOWER EXTREMITY ROM:  Active ROM Right eval Left eval  Hip flexion  50  Hip extension    Hip abduction  Hip adduction    Hip internal rotation    Hip external rotation    Knee flexion    Knee extension    Ankle dorsiflexion    Ankle plantarflexion    Ankle inversion    Ankle eversion     (Blank rows = not tested)  LOWER EXTREMITY MMT: ( Pt surgery yesterday 10/10; therefore mm tested not completed for hip.  Pt only able to lift Rt leg up 6")  MMT Right eval Left eval  Hip flexion  2+  Hip extension    Hip abduction    Hip adduction    Hip internal rotation    Hip external rotation    Knee flexion    Knee extension  3+  Ankle dorsiflexion 5 5  Ankle plantarflexion    Ankle inversion    Ankle eversion     (Blank rows = not tested)    FUNCTIONAL TESTS:  30 seconds chair stand test:  hands on thigh 2 x  2 minute walk test: 207 ft with RW Single leg stance:  unable on either leg     TODAY'S TREATMENT: Evaluation             Sitting :  RT LAQ x 10            Supine:  quad set x 10                          Heel slide x 5                          SLR x 5   PATIENT EDUCATION:  Education details: HEP  Person educated:  Patient Education method: Explanation and Handouts Education comprehension: verbalized understanding   HOME EXERCISE PROGRAM: Access Code: [email or web address] URL: [email or web address] Date: 03/05/2022 Prepared by: Rayetta Humphrey  Exercises - Seated Knee Extension AROM  - 3 x daily - 7 x weekly - 1 sets - 10 reps - 3-5" hold - Supine Quadricep Sets  - 3 x daily - 7 x weekly - 1 sets - 10 reps - 3-5" hold - Supine Heel Slide  - 3 x daily - 7 x weekly - 1 sets - 10 reps - 3-5" hold - Small Range Straight Leg Raise  - 3 x daily - 7 x weekly - 1 sets - 10 reps - 3" hold  ASSESSMENT:  CLINICAL IMPRESSION: Patient is a 51 y.o. male  who was seen today for physical therapy evaluation and treatment for Right total hip replacement completed yesterday.  At this time evaluation demonstrates decreased balance, decreased ROM, decreased strength and increased pain of pt RT hip.   Mr. Deleeuw will benefit from skilled PT to address these issues and maximize his functional ability.     OBJECTIVE IMPAIRMENTS Abnormal gait, decreased activity tolerance, decreased balance, decreased mobility, difficulty walking, decreased ROM, decreased strength, and pain.   ACTIVITY LIMITATIONS carrying, lifting, bending, sitting, standing, squatting, bed mobility, and locomotion level  PARTICIPATION LIMITATIONS: meal prep, cleaning, laundry, driving, shopping, community activity, and yard work  PERSONAL FACTORS 1-2 comorbidities: DM, OA with LT THR  are also affecting patient's functional outcome.   REHAB POTENTIAL: Good  CLINICAL DECISION MAKING: Stable/uncomplicated  EVALUATION COMPLEXITY: Low   GOALS: Goals reviewed with patient? No  SHORT TERM GOALS: Target date: 04/01/2022  Pt to be I in HEP to allow hip flexion to be to 90 degrees to  be able to sit in comfort for an hour.  Baseline: Goal status: INITIAL  2.  PT mm strength to increase by 1/2 grade to be able to come sit to stand from a chair  without UE with ease.  Baseline:  Goal status: INITIAL  3.  PT to be able to single leg stance on B0th LE for at least 5 seconds to decrease risk of falling  Baseline:  Goal status: INITIAL  4.  Pt to be ambulating with a cane Baseline:  Goal status: INITIAL   LONG TERM GOALS: Target date: 04/22/2022   PT to be I in advanced HEP to be able  to improve hip ROM to be able to squat down and pick items off the floor  Baseline:  Goal status: INITIAL  2.  Pt strength to be increased one grade to allow pt to go up and down 6 steps in a reciprocal manner with one hand hold  Baseline:  Goal status: INITIAL  3.  Pt to be able to single leg stance for 10 seconds B for improved safety when walking on uneven terrain.  Baseline:  Goal status: INITIAL  4.  PT to be ambulating without assistive device  Baseline:  Goal status: INITIAL  5.  PT pain to be no greater than a 1/10 throughout the day  Baseline:  Goal status: INITIAL  PLAN: PT FREQUENCY: 2x/week  PT DURATION: 6 weeks  PLANNED INTERVENTIONS: Therapeutic exercises, Therapeutic activity, Balance training, Gait training, Patient/Family education, Self Care, and Manual therapy  PLAN FOR NEXT SESSION: Continue with ROM, strength, and balance   Rayetta Humphrey, PT CLT [phone]  03/05/2022, 1:49 PM

## 2022-03-09 ENCOUNTER — Encounter (HOSPITAL_COMMUNITY): Payer: Medicaid Other | Admitting: Physical Therapy

## 2022-03-11 ENCOUNTER — Ambulatory Visit (HOSPITAL_COMMUNITY): Payer: Medicaid Other | Admitting: Physical Therapy

## 2022-03-11 DIAGNOSIS — M6281 Muscle weakness (generalized): Secondary | ICD-10-CM

## 2022-03-11 DIAGNOSIS — R2689 Other abnormalities of gait and mobility: Secondary | ICD-10-CM

## 2022-03-11 DIAGNOSIS — M25551 Pain in right hip: Secondary | ICD-10-CM

## 2022-03-11 NOTE — Therapy (Signed)
OUTPATIENT PHYSICAL THERAPY LOWER EXTREMITY Treatment   Patient Name: [name] MRN: [id number] DOB:1971/05/08, 51 y.o., male Today's Date: 03/11/2022   PT End of Session - 03/11/22 1305     Visit Number 2    Number of Visits 12    Date for PT Re-Evaluation 04/22/22    Authorization Type Medicaid Healthy Blue    Authorization Time Period healthy blue approved 6 visit 10/18-12/16    Authorization - Visit Number 2    Authorization - Number of Visits 6    Progress Note Due on Visit 10    PT Start Time 1301    PT Stop Time 1342    PT Time Calculation (min) 41 min    Activity Tolerance Patient tolerated treatment well;No increased pain    Behavior During Therapy [facility] for tasks assessed/performed              Past Medical History:  Diagnosis Date   ALLERGY 08/03/2008   Qualifier: Diagnosis of  By: Christ Kick     Anemia    Arthritis    "hips" (01/15/2015)   Blood transfusion without reported diagnosis    Cellulitis 07/25/2018   Chronic back pain greater than 3 months duration 02/11/2017   Chronic lower back pain    Colon polyps    COLONIC POLYPS, ADENOMATOUS 08/03/2008   Qualifier: Diagnosis of  By: Christ Kick     CVA 08/03/2008   Qualifier: History of  By: Christ Kick     Depression    denies   Diabetes mellitus    diet controlled- no meds since wt loss   Diabetes mellitus ([place]) 07/10/2019   Dysrhythmia    s/p surgery bariatric- cardioversion   Erectile dysfunction 01/26/2018   GERD (gastroesophageal reflux disease)    HIATAL HERNIA 08/03/2008   Qualifier: Diagnosis of  By: Christ Kick     Hip pain    History of gout    Hyperlipidemia    Hypertension    Joint pain    Narcotic dependence ([place]) 02/11/2017   Neuromuscular disorder ([place])    PONV (postoperative nausea and vomiting)    Prediabetes 03/29/2017   Sleep apnea    no longer have to use cpap since wt loss   Stage 3 chronic kidney disease ([place]) 01/26/2018   Stroke ([place])  2006   denies residual on 01/15/2015   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 ([place]) 08/01/2018   Past Surgical History:  Procedure Laterality Date   BARIATRIC SURGERY  2010   in [place]  02/26/2021   Procedure: BIOPSY;  Surgeon: Harvel Quale, MD;  [facility];  Service: Gastroenterology;;   CARDIOVERSION     COLONOSCOPY N/A 08/02/2014   Procedure: COLONOSCOPY;  Surgeon: Rogene Houston, MD;  [facility];  Service: Endoscopy;  Laterality: N/A;  210 - moved to 3/10 @ 11:55 - Ann notified pt   COLONOSCOPY WITH PROPOFOL N/A 02/26/2021   Procedure: COLONOSCOPY WITH PROPOFOL;  Surgeon: Harvel Quale, MD;  [facility];  Service: Gastroenterology;  Laterality: N/A;  10:00   ESOPHAGOGASTRODUODENOSCOPY     ESOPHAGOGASTRODUODENOSCOPY (EGD) WITH PROPOFOL N/A 02/26/2021   Procedure: ESOPHAGOGASTRODUODENOSCOPY (EGD) WITH PROPOFOL;  Surgeon: Harvel Quale, MD;  [facility];  Service: Gastroenterology;  Laterality: N/A;   HERNIA REPAIR     LIPOSUCTION     PANNICULECTOMY  01/14/2015   PANNICULECTOMY N/A 01/14/2015   Procedure:  TOTAL PANNICULECTOMY [facility]  LIPOSUCTION OF SIDES;  Surgeon: Cristine Polio, MD;  Location: [place];  Service: Plastics;  Laterality: N/A;   POLYPECTOMY  02/26/2021   Procedure: POLYPECTOMY INTESTINAL;  Surgeon: Harvel Quale, MD;  [facility];  Service: Gastroenterology;;   TONSILLECTOMY     TOTAL HIP ARTHROPLASTY Left 12/16/2021   Procedure: TOTAL HIP ARTHROPLASTY ANTERIOR APPROACH;  Surgeon: Renette Butters, MD;  [facility];  Service: Orthopedics;  Laterality: Left;   TOTAL HIP ARTHROPLASTY Right 03/03/2022   Procedure: TOTAL HIP ARTHROPLASTY ANTERIOR APPROACH;  Surgeon: Renette Butters, MD;  [facility];  Service: Orthopedics;  Laterality: Right;   VENTRAL HERNIA REPAIR  01/14/2015   VENTRAL HERNIA REPAIR N/A 01/14/2015   Procedure: HERNIA REPAIR  VENTRAL ADULT AND MUSCLE REPAIR;  Surgeon: Cristine Polio, MD;  Location: [place];  Service: Plastics;  Laterality: N/A;   Patient Active Problem List   Diagnosis Date Noted   S/P total right hip arthroplasty 03/03/2022   Annual visit for general adult medical examination with abnormal findings 02/17/2022   S/P total left hip arthroplasty 12/16/2021   Need for varicella vaccine 10/16/2021   OSA  09/11/2021   Pre-operative clearance 06/19/2021   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   Immunization due 02/06/2021   Iron deficiency anemia 12/30/2020   History of colonic polyps 12/30/2020   Unilateral primary osteoarthritis, right hip 08/13/2020   Unilateral primary osteoarthritis, left hip 08/13/2020   Muscle spasms of both lower extremities 05/23/2020   Need for immunization against influenza 03/06/2020   Primary osteoarthritis of both hips 07/10/2019   Bilateral primary osteoarthritis of hip 07/10/2019   Vitamin D deficiency 04/13/2019   Morbid obesity with body mass index (BMI) of 40.0 to 44.9 in adult ([place]) 04/13/2019   Depressive disorder 04/13/2019   Body mass index (BMI) 45.0-49.9, adult ([place]) 04/13/2019   Kidney disease, chronic, stage III (GFR 30-59 ml/min) ([place]) 07/20/2017   History of cerebrovascular accident 07/20/2017   Gastroesophageal reflux disease 07/20/2017   Gastric bypass status for obesity 02/11/2017   Benzodiazepine dependence ([place]) 02/11/2017   Type 2 diabetes mellitus with diabetic nephropathy ([place]) 08/03/2008   Hyperlipidemia 08/03/2008   High blood pressure 08/03/2008   SLEEP APNEA 08/03/2008   Sleep apnea 08/03/2008    PCP: Dr. Doren Custard  REFERRING PROVIDER:  Renette Butters, MD  REFERRING DIAG: post op rt THR (DOS 03/03/2022), anterior approach  THERAPY DIAG:  Muscle weakness, Right hip pain, difficulty in walking   Rationale for Evaluation and Treatment Rehabilitation  ONSET DATE:  03/03/22  SUBJECTIVE:   SUBJECTIVE STATEMENT: PT states the hardest thing is getting his socks on; having to squat rather than ben, getting out of chair and his balance.  Comes today using walking stick rather than the RW. No longer using the walker. Pt reports taking a mm relaxer and oxycodone for pain and took prior to his visit.    PERTINENT HISTORY:  Rt THR (DOS 03/03/2022)DM, HTN, Lt THR, OA   PAIN:  Are you having pain? Yes: NPRS scale: 3/10 Pain location: Rt hip  Pain description: throb Aggravating factors: walking  Relieving factors: rest   PRECAUTIONS: Anterior hip  WEIGHT BEARING RESTRICTIONS No  FALLS:  Has patient fallen in last 6 months? No  LIVING ENVIRONMENT: Lives with: lives with their family Lives in: House/apartment Stairs: No Has following equipment at home: Single point cane, Environmental consultant - 2 wheeled, Electronics engineer, and Grab bars  OCCUPATION:  disability   PLOF: Independent with basic ADLs  PATIENT GOALS PT just wants to be able to walk right, go to the gym    OBJECTIVE:    POSTURE: No Significant postural limitations    LOWER EXTREMITY ROM:  Active ROM Right eval Left eval  Hip flexion  50  Hip extension    Hip abduction    Hip adduction    Hip internal rotation    Hip external rotation    Knee flexion    Knee extension    Ankle dorsiflexion    Ankle plantarflexion    Ankle inversion    Ankle eversion     (Blank rows = not tested)  LOWER EXTREMITY MMT: ( Pt surgery yesterday 10/10; therefore mm tested not completed for hip.  Pt only able to lift Rt leg up 6")  MMT Right eval Left eval  Hip flexion  2+  Hip extension    Hip abduction    Hip adduction    Hip internal rotation    Hip external rotation    Knee flexion    Knee extension  3+  Ankle dorsiflexion 5 5  Ankle plantarflexion    Ankle inversion    Ankle eversion     (Blank rows = not tested)    FUNCTIONAL TESTS:  30 seconds chair stand test:  hands on thigh 2 x   2 minute walk test: 207 ft with RW Single leg stance:  unable on either leg     TODAY'S TREATMENT: 03/11/22 Ambulation 200 feet with walking stick Sit to stand from standard chair no UE 5X Standing intermittent HHA: Heelraises/Toeraises 20X each  Hip abduction 2X10 each  Hip extension 2X10 each  Tandem stance X30"each LE lead with max of 4" without UE assist Seated:  LAQ 20x3" holds Supine:  Bridge 20X  SLR 2X10  Heelslide Rt only 10X    Evaluation             Sitting :  RT LAQ x 10            Supine:  quad set x 10                          Heel slide x 5                          SLR x 5   PATIENT EDUCATION:  Education details: HEP  Person educated: Patient Education method: Explanation and Handouts Education comprehension: verbalized understanding   HOME EXERCISE PROGRAM: Access Code: [email or web address] URL: [email or web address] Date: 03/05/2022 Prepared by: Rayetta Humphrey  Exercises - Seated Knee Extension AROM  - 3 x daily - 7 x weekly - 1 sets - 10 reps - 3-5" hold - Supine Quadricep Sets  - 3 x daily - 7 x weekly - 1 sets - 10 reps - 3-5" hold - Supine Heel Slide  - 3 x daily - 7 x weekly - 1 sets - 10 reps - 3-5" hold - Small Range Straight Leg Raise  - 3 x daily - 7 x weekly - 1 sets - 10 reps - 3" hold  ASSESSMENT:  CLINICAL IMPRESSION: Began session with ambulation around facility using walking stick.  Initial antalgia and difficulty getting out of waiting room chair, however improved with cues.  Worked on quad strength with standing from chair.  Encouraged pt to use his sock donner (states he has one) and  reviewed hip precautions.  Progressed to standing exercises with ability to complete good contraction in decreased ROM.  Difficulty maintaining tandem stance greater than 4 seconds with either LE lead and pain with attempted vector activity.    Pt required minimal rest breaks during session and able to recall HEP given at initial evaluation.   Goals  reviewed with one goal met regarding ambulation using cane.  Pt will benefit from skilled PT to address deficits and maximize his functional ability.     OBJECTIVE IMPAIRMENTS Abnormal gait, decreased activity tolerance, decreased balance, decreased mobility, difficulty walking, decreased ROM, decreased strength, and pain.   ACTIVITY LIMITATIONS carrying, lifting, bending, sitting, standing, squatting, bed mobility, and locomotion level  PARTICIPATION LIMITATIONS: meal prep, cleaning, laundry, driving, shopping, community activity, and yard work  PERSONAL FACTORS 1-2 comorbidities: DM, OA with LT THR  are also affecting patient's functional outcome.   REHAB POTENTIAL: Good  CLINICAL DECISION MAKING: Stable/uncomplicated  EVALUATION COMPLEXITY: Low   GOALS: Goals reviewed with patient? Yes  SHORT TERM GOALS: Target date: 04/01/2022  Pt to be I in HEP to allow hip flexion to be to 90 degrees to be able to sit in comfort for an hour.  Baseline: Goal status: IN PROGRESS  2.  PT mm strength to increase by 1/2 grade to be able to come sit to stand from a chair without UE with ease.  Baseline:  Goal status: IN PROGRESS  3.  PT to be able to single leg stance on B0th LE for at least 5 seconds to decrease risk of falling  Baseline:  Goal status: IN PROGRESS  4.  Pt to be ambulating with a cane Baseline:  Goal status: MET   LONG TERM GOALS: Target date: 04/22/2022   PT to be I in advanced HEP to be able  to improve hip ROM to be able to squat down and pick items off the floor  Baseline:  Goal status: IN PROGRESS  2.  Pt strength to be increased one grade to allow pt to go up and down 6 steps in a reciprocal manner with one hand hold  Baseline:  Goal status: IN PROGRESS  3.  Pt to be able to single leg stance for 10 seconds B for improved safety when walking on uneven terrain.  Baseline:  Goal status: IN PROGRESS  4.  PT to be ambulating without assistive device  Baseline:   Goal status: IN PROGRESS  5.  PT pain to be no greater than a 1/10 throughout the day  Baseline:  Goal status: IN PROGRESS  PLAN: PT FREQUENCY: 2x/week  PT DURATION: 6 weeks  PLANNED INTERVENTIONS: Therapeutic exercises, Therapeutic activity, Balance training, Gait training, Patient/Family education, Self Care, and Manual therapy  PLAN FOR NEXT SESSION: Continue with ROM, strength, and balance. Progress to vector, gait without AD and stairs when able.  Teena Irani, PTA/CLT [place] Ph: [phone]  03/11/2022, 1:49 PM

## 2022-03-12 ENCOUNTER — Encounter (HOSPITAL_COMMUNITY): Payer: Medicaid Other | Admitting: Physical Therapy

## 2022-03-13 ENCOUNTER — Other Ambulatory Visit: Payer: Self-pay | Admitting: Obstetrics and Gynecology

## 2022-03-13 NOTE — Patient Instructions (Signed)
Hi [name], glad things are going well for you since surgery-have a great day!!  [name] was given information about Medicaid Managed Care team care coordination services as a part of their [facility] benefit. [name] verbally consented to engagement with the [facility] Managed Care team.   If you are experiencing a medical emergency, please call 911 or report to your local emergency department or urgent care.   If you have a non-emergency medical problem during routine business hours, please contact your provider's office and ask to speak with a nurse.   For questions related to your Healthy [facility] health plan, please call: [phone] or visit the homepage here: [email or web address]  If you would like to schedule transportation through your Healthy [facility] plan, please call the following number at least 2 days in advance of your appointment: [phone]  For information about your ride after you set it up, call Ride Assist at [phone]. Use this number to activate a Will Call pickup, or if your transportation is late for a scheduled pickup. Use this number, too, if you need to make a change or cancel a previously scheduled reservation.  If you need transportation services right away, call [phone]. The after-hours call center is staffed 24 hours to handle ride assistance and urgent reservation requests (including discharges) 365 days a year. Urgent trips include sick visits, hospital discharge requests and life-sustaining treatment.  Call the [place] at [phone], at any time, 24 hours a day, 7 days a week. If you are in danger or need immediate medical attention call 911.  If you would like help to quit smoking, call 1-800-QUIT-NOW 276-453-3008) OR Espaol: 1-855-Djelo-Ya (8-756-433-2951) o para ms informacin haga clic aqu or Text READY to 200-400 to register via text  [name] -  following are the goals we discussed in your visit today:   Goals Addressed   None   Timeframe:  Long-Range Goal Priority:  High Start Date:     10/21/21                        Expected End Date:  ongoing                     Follow Up Date: 04/13/22   - schedule appointment for vaccines needed due to my age or health - schedule recommended health tests (blood work, mammogram, colonoscopy, pap test) - schedule and keep appointment for annual check-up    Why is this important?   Screening tests can find diseases early when they are easier to treat.  Your doctor or nurse will talk with you about which tests are important for you.  Getting shots for common diseases like the flu and shingles will help prevent them.  03/13/22:  Attending PT 2 times a week, PCP and Nephrology appointments completed in September  Patient verbalizes understanding of instructions and care plan provided today and agrees to view in [place]. Active MyChart status and patient understanding of how to access instructions and care plan via MyChart confirmed with patient.     The Managed Medicaid care management team will reach out to the patient again over the next 30 business  days.  The  Patient  has been provided with contact information for the Managed Medicaid care management team and has been advised to call with any health related questions or concerns.   [name] Raider RN, BSN [employer]  Network Care Management Coordinator - Managed Medicaid High Risk [phone]   Following is a copy of your plan of care:  Care Plan : [place]  Updates made by Gayla Medicus, RN since 03/13/2022 12:00 AM     Problem: Chronic Disease Management and Care Coordination Needs for DM, HTN, HLD, CKD      Long-Range Goal: Development of Plan Of Care For Chronic Disease Management and Care Coordination Needs to Assist With Meeting Treatment Goals for DM, CKD, HTN, obesity, chronic hip pain    Start Date: 01/07/2021  Expected End Date: 06/13/2022  Priority: High  Note:   Current Barriers:  Knowledge Deficits related to plan of care for management of HTN, HLD, CKD, and DMII, chronic pain, obesity Chronic Disease Management support and education needs related to HTN, HLD, and DMII, chronic pain 03/13/22:  patient with no complaints today, 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  Nephrology appointment WNL.  BP and BG WNL.  RNCM Clinical Goal(s):  Patient will verbalize understanding of plan for management of HTN, HLD, CKD and DMII take all medications exactly as prescribed and will call provider for medication related questions attend all scheduled medical appointments:  demonstrate ongoing adherence to prescribed treatment plan for HTN, HLD, CKD and DMII as evidenced by daily/ twice daily monitoring and recording of CBG,  adherence to ADA/ carb modified, fat modified, no added salt diet, continue 30-40 minutes of exercise on your NuStep 5-7 days/week, adherence to prescribed medication regimen including new diabetes medication,  contacting provider for new or worsened symptoms or questions related to HTN, DM or HLD, CKD continue to work with Consulting civil engineer and managed Medicaid pharmacist to address care management and care coordination needs related to HTN, HLD, and DMII , CKD  Interventions: Inter-disciplinary care team collaboration (see longitudinal plan of care) Evaluation of current treatment plan related to  self management and patient's adherence to plan as established by provider Reviewed appointments and insured patient has transportation to all appointments   Chronic Kidney Disease (Status: Goal on Track (progressing): YES.) - all recent provider BP readings are meeting treatment targets, patient reports his nephrologist was pleased that his creatinine has stabilized and that his BP reading during office visit was good ,   patient states he checks his BP daily  and that readings are usually <130/<80 Last practice recorded BP readings:   03/13/22-BP=118/70 BP Readings from Last 3 Encounters:  08/01/21 107/76  06/26/21 110/76  06/19/21 116/76         Most recent eGFR/CrCl:  Lab Results  Component Value Date   NA 137 05/15/2021   K 4.1 05/15/2021   CREATININE 1.63 (H) 05/15/2021   EGFR 51 (L) 05/15/2021   GFRNONAA 37 (L) 01/10/2020   GLUCOSE 128 (H) 05/15/2021    Reviewed medications with patient and discussed importance of compliance   Assessed frequency of self monitored BP, reviewed home and provider office readings, reviewed treatment targets and reviewed lifestyle strategies   Reviewed addition of Farxiga to medication regimen for blood pressure, blood sugar and weight control in addition to slowing progression of CKD and lowering risks of heart attack, stroke and cardiac death , assessed tolerance of Farxiga and reviewed major adverse side effects   Reviewed scheduled/upcoming provider appointments  Discussed plans with patient for ongoing care management follow up and provided patient with direct contact information for care management team    Diabetes:  (  Status: Goal on track: YES.)- patient reports self monitored fasting CBGs are usually <130, checks 1-2 times a day.  Most recent A1C=6.1 on 10/17/21. Lab Results  Component Value Date   HGBA1C 7.0 (H) 05/15/2021  Assessed frequency of CBG testing and reviewed fasting target Reviewed medications, including recent addition of Farxiga to medication regimen with patient and discussed importance of good medication taking behavior Discussed plans with patient for ongoing care management follow up and provided patient with direct contact information for care management team;               Health Maintenance (Status: Condition stable. Not addressed this visit.) - patient completed colonoscopy and endoscopy on 02/26/21 and was aware of pathology report- non  cancerous Patient interviewed about adult health maintenance status   Pneumonia Vaccine-  received pneumococcal  vaccine on 05/15/21 Influenza Vaccine- states he received flu shot on 02/06/21 COVID vaccination   - pt states he never received  Shingles Vaccination- received first dose on 02/06/21, did not receive 2nd vaccination so will message provider prior to his appointment on May 11,2023-second shingles vaccine received 10/17/21 Regular eye checkups- says he sees his eye doctor, Dr [name],  every 6 months due to glaucoma and his next appointment is in November  Advanced Directives- states he does not have and does not want information at this time  Dental Care- states his dentist is Dr Mina Marble in [place] and he sees her once-twice yearly   Hyperlipidemia:  (Status: Condition stable. Not addressed this visit. Lab Results  Component Value Date   CHOL 162 05/15/2021   HDL 48 05/15/2021   LDLCALC 88 05/15/2021   TRIG 147 05/15/2021   CHOLHDL 6.1 (H) 12/20/2019     Medication review performed; medication list updated in electronic medical record.   Hypertension: (Status: Goal on Track (progressing): YES.)-patient states he checks his BP daily and that readings are usually <130/<80.  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 Last practice recorded BP readings:  BP Readings from Last 3 Encounters:  08/01/21 107/76  06/26/21 110/76  06/19/21 116/76    Most recent eGFR/CrCl:  Lab Results  Component Value Date   NA 137 05/15/2021   K 4.1 05/15/2021   CREATININE 1.63 (H) 05/15/2021   EGFR 51 (L) 05/15/2021   GFRNONAA 37 (L) 01/10/2020   GLUCOSE 128 (H) 05/15/2021   Evaluation of current treatment plan related to hypertension self management and patient's adherence to plan as established by provider;   Reviewed BP medications and assessed medication taking behavior ensuring he is taking Losartan that was recently added to his  regimen Assessed frequency of self monitored blood press and readings, reviewed treatment targets Discussed plans with patient for ongoing care management follow up and provided patient with direct contact information for care management team; Reviewed scheduled/upcoming provider appointments  Pain:  (Status: Goal on track: Yes. Medications reviewed Reviewed provider established plan for pain management; Counseled on the importance of reporting any/all new or changed pain symptoms or management strategies to pain management provider;  Weight Loss Interventions:  (Status:  New goal.) Long Term Goal Provided verbal and/or written education to patient re: provider recommended life style modifications  Reviewed recommended dietary changes: avoid fad diets, make small/incremental dietary and exercise changes, eat at the table and avoid eating in front of the TV, plan management of cravings, monitor snacking and cravings in food diary Congratulated patient on good medication taking behavior, ongoing weight  loss  and 30-40 minutes of Nu Step home exercise each evening Reviewed [facility] benefit related to assisting with weight loss Encouraged patient to reschedule with [name] for medical nutrition therapy  Patient Goals/Self-Care Activities: Take medications as prescribed   Attend all scheduled provider appointments Call pharmacy for medication refills 3-7 days in advance of running out of medications Perform all self care activities independently  Perform IADL's (shopping, preparing meals, housekeeping, managing finances) independently Call provider office for new concerns or questions  Call and schedule dentist appointment with [name] for check up and cleaning as soon as possible , phone number [phone] Reschedule a follow up appointment with [name], dietician and [facility], to help with ongoing weight loss

## 2022-03-13 NOTE — Patient Outreach (Signed)
Medicaid Managed Care   Nurse Care Manager Note  03/13/2022 Name:  [name] MRN:  [id number] DOB:  13-May-1971  [name] is an 51 y.o. year old male who is a primary patient of [name] Abraham, MD.  [facility] Managed Care Coordination team was consulted for assistance with:    Chronic healthcare management needs, HTN, DM, chronic pain, CKD, osteoarthritis, HLD, OSA, GERD  [name] was given information about Medicaid Managed Care Coordination team services today. [name] Patient agreed to services and verbal consent obtained.  Engaged with patient by telephone for follow up visit in response to provider referral for case management and/or care coordination services.   Assessments/Interventions:  Review of past medical history, allergies, medications, health status, including review of consultants reports, laboratory and other test data, was performed as part of comprehensive evaluation and provision of chronic care management services.  SDOH (Social Determinants of Health) assessments and interventions performed: SDOH Interventions    Flowsheet Row Patient Outreach Telephone from 03/13/2022 in [place] Patient Outreach Telephone from 02/09/2022 in [place] Patient Outreach Telephone from 12/30/2021 in [place] Patient Outreach Telephone from 11/21/2021 in [place] Patient Outreach Telephone from 10/21/2021 in [place] Patient Outreach Telephone from 01/07/2021 in [place] Interventions        Food Insecurity Interventions -- Intervention Not Indicated -- Intervention Not Indicated -- Intervention Not Indicated  Housing Interventions -- -- -- -- -- Intervention Not Indicated  [patient states he has applied for  Section 8 housing]  Transportation Interventions -- -- -- -- -- Intervention Not Indicated  Utilities Interventions -- Intervention Not Indicated -- -- -- --  Alcohol Usage Interventions Intervention Not Indicated (Score <7) -- -- -- -- --  Financial Strain Interventions -- -- Intervention Not Indicated -- Intervention Not Indicated Intervention Not Indicated  Physical Activity Interventions -- -- -- -- -- Intervention Not Indicated  Stress Interventions -- -- -- -- -- Intervention Not Indicated      Care Plan  No Known Allergies  Medications Reviewed Today     Reviewed by Gayla Medicus, RN (Registered Nurse) on 03/13/22 at [place] List Status: <None>   Medication Order Taking? Sig Documenting Provider Last Dose Status Informant  acetaminophen (TYLENOL) 500 MG tablet [id number] No Take 2 tablets (1,000 mg total) by mouth every 6 (six) hours as needed for mild pain or moderate pain. Britt Bottom, PA-C Taking Active Self  atorvastatin (LIPITOR) 40 MG tablet [id number] No Take 1 tablet (40 mg total) by mouth daily. [name] Abraham, MD 03/03/2022 0330 Active   Azelastine HCl (ASTEPRO NA) [id number] No Place 1 spray into the nose daily as needed (allergies). [provider] Taking Active Self  blood glucose meter kit and supplies KIT [id number] No Test daily Dx  r73.03 [name], DO 03/02/2022 Active Self  Blood Pressure Monitor KIT [id number] No 1 each by Does not apply route daily. [name], NP 03/02/2022 Active Self  Cyanocobalamin (B-12 PO) [id number] No Take 1 tablet by mouth daily. [provider] Past Week Active Self  docusate sodium (COLACE) 100 MG capsule [id number]  Take 1 capsule (100 mg total) by mouth 2 (two) times daily as needed for mild constipation or moderate constipation. Gawne, Meghan M, PA-C  Active   FARXIGA 5 MG TABS  tablet [id number] No Take 1 tablet (5 mg total) by mouth every morning. [name] Abraham, MD 02/28/2022 Active   ferrous sulfate  325 (65 FE) MG tablet [id number] No Take 325 mg by mouth every other day. [provider] Past Week Active Self  gabapentin (NEURONTIN) 300 MG capsule [id number]  TAKE (1) CAPSULE BY MOUTH AT BEDTIME. [name] Abraham, MD  Active   glucose blood test strip [id number] No Use as instructed. Can check up to 4 times daily. [name], NP Past Week Active Self  KERENDIA 10 MG TABS [id number] No Take 10 mg by mouth daily. [provider] 03/03/2022 0330 Active Self  latanoprost (XALATAN) 0.005 % ophthalmic solution [id number] No Place 1 drop into both eyes at bedtime. [provider] 03/02/2022 Active Self  loratadine (CLARITIN) 10 MG tablet [id number] No Take 10 mg by mouth daily. [provider] 03/03/2022 0330 Active Self  losartan (COZAAR) 25 MG tablet [id number] No Take 1 tablet (25 mg total) by mouth daily. [name], MD 03/03/2022 0330 Active Self  Melatonin 10 MG CAPS [id number] No Take 10 mg by mouth at bedtime. [provider] 03/02/2022 Active Self  methocarbamol (ROBAXIN-750) 750 MG tablet [id number]  Take 1 tablet (750 mg total) by mouth every 8 (eight) hours as needed for muscle spasms (DO NOT TAKE WITH OTHER MUSCLE RELAXERS!). [name] M, PA-C  Active   Multiple Vitamin (MULTIVITAMIN) tablet [id number] No Take 1 tablet by mouth daily. One a day [provider] 03/02/2022 Active Self  ondansetron (ZOFRAN-ODT) 4 MG disintegrating tablet [id number]  Take 1 tablet (4 mg total) by mouth every 8 (eight) hours as needed for nausea or vomiting. [name] M, PA-C  Active   oxyCODONE (ROXICODONE) 5 MG immediate release tablet [id number]  Take 1 tablet (5 mg total) by mouth every 8 (eight) hours as needed for severe pain or breakthrough pain (after surgery that is not resolved by your normal daily dose of Oxycodone). [name] M, PA-C  Active   Oxycodone HCl 20 MG TABS [id number] No Take 20 mg by mouth 4 (four) times daily as needed (pain).  [provider] 03/02/2022 Active Self           Med Note [name]   Thu Nov 27, 2021 12:05 PM)    rivaroxaban (XARELTO) 10 MG TABS tablet [id number]  Take 1 tablet (10 mg total) by mouth daily. To prevent blood clots after surgery. Britt Bottom, PA-C  Active   Semaglutide, 2 MG/DOSE, 8 MG/3ML SOPN [id number] No Inject 2 mg as directed once a week.  Patient taking differently: Inject 2 mg as directed every Wednesday.   [name], NP 02/25/2022 Active Self           Med Note Wilmon Pali, [name]   Thu Nov 27, 2021 10:48 AM) Ozempic  sildenafil (VIAGRA) 50 MG tablet [id number] No Take 1 tablet (50 mg total) by mouth daily as needed for erectile dysfunction. [name], FNP Past Month Active Self  timolol (BETIMOL) 0.5 % ophthalmic solution [id number] No Place 1 drop into both eyes in the morning. [provider] 03/02/2022 Active Self           Patient Active Problem List   Diagnosis Date Noted   S/P total right hip arthroplasty 03/03/2022   Annual visit for general adult medical examination with abnormal findings 02/17/2022   S/P total left hip arthroplasty 12/16/2021   Need for varicella vaccine 10/16/2021  OSA  09/11/2021   Pre-operative clearance 06/19/2021   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   Immunization due 02/06/2021   Iron deficiency anemia 12/30/2020   History of colonic polyps 12/30/2020   Unilateral primary osteoarthritis, right hip 08/13/2020   Unilateral primary osteoarthritis, left hip 08/13/2020   Muscle spasms of both lower extremities 05/23/2020   Need for immunization against influenza 03/06/2020   Primary osteoarthritis of both hips 07/10/2019   Bilateral primary osteoarthritis of hip 07/10/2019   Vitamin D deficiency 04/13/2019   Morbid obesity with body mass index (BMI) of 40.0 to 44.9 in adult [facility]) 04/13/2019   Depressive disorder 04/13/2019   Body mass index (BMI)  45.0-49.9, adult ([place]) 04/13/2019   Kidney disease, chronic, stage III (GFR 30-59 ml/min) ([place]) 07/20/2017   History of cerebrovascular accident 07/20/2017   Gastroesophageal reflux disease 07/20/2017   Gastric bypass status for obesity 02/11/2017   Benzodiazepine dependence ([place]) 02/11/2017   Type 2 diabetes mellitus with diabetic nephropathy ([place]) 08/03/2008   Hyperlipidemia 08/03/2008   High blood pressure 08/03/2008   SLEEP APNEA 08/03/2008   Sleep apnea 08/03/2008   Conditions to be addressed/monitored per PCP order:  Chronic healthcare management needs, HTN, DM, chronic pain, CKD, osteoarthritis, HLD, OSA, GERD  Care Plan : RN Care Manager Plan Of Care  Updates made by Gayla Medicus, RN since 03/13/2022 12:00 AM     Problem: Chronic Disease Management and Care Coordination Needs for DM, HTN, HLD, CKD      Long-Range Goal: Development of Plan Of Care For Chronic Disease Management and Care Coordination Needs to Assist With Meeting Treatment Goals for DM, CKD, HTN, obesity, chronic hip pain   Start Date: 01/07/2021  Expected End Date: 06/13/2022  Priority: High  Note:   Current Barriers:  Knowledge Deficits related to plan of care for management of HTN, HLD, CKD, and DMII, chronic pain, obesity Chronic Disease Management support and education needs related to HTN, HLD, and DMII, chronic pain 03/13/22:  patient with no complaints today, 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  Nephrology appointment WNL.  BP and BG WNL.  RNCM Clinical Goal(s):  Patient will verbalize understanding of plan for management of HTN, HLD, CKD and DMII take all medications exactly as prescribed and will call provider for medication related questions attend all scheduled medical appointments:  demonstrate ongoing adherence to prescribed treatment plan for HTN, HLD, CKD and DMII as evidenced by daily/ twice daily monitoring and recording of CBG,   adherence to ADA/ carb modified, fat modified, no added salt diet, continue 30-40 minutes of exercise on your NuStep 5-7 days/week, adherence to prescribed medication regimen including new diabetes medication,  contacting provider for new or worsened symptoms or questions related to HTN, DM or HLD, CKD continue to work with Consulting civil engineer and managed Medicaid pharmacist to address care management and care coordination needs related to HTN, HLD, and DMII , CKD  Interventions: Inter-disciplinary care team collaboration (see longitudinal plan of care) Evaluation of current treatment plan related to  self management and patient's adherence to plan as established by provider Reviewed appointments and insured patient has transportation to all appointments   Chronic Kidney Disease (Status: Goal on Track (progressing): YES.) - all recent provider BP readings are meeting treatment targets, patient reports his nephrologist was pleased that his creatinine has stabilized and that his BP reading during office visit was good ,  patient  states he checks his BP daily  and that readings are usually <130/<80 Last practice recorded BP readings:   03/13/22-BP=118/70 BP Readings from Last 3 Encounters:  08/01/21 107/76  06/26/21 110/76  06/19/21 116/76         Most recent eGFR/CrCl:  Lab Results  Component Value Date   NA 137 05/15/2021   K 4.1 05/15/2021   CREATININE 1.63 (H) 05/15/2021   EGFR 51 (L) 05/15/2021   GFRNONAA 37 (L) 01/10/2020   GLUCOSE 128 (H) 05/15/2021    Reviewed medications with patient and discussed importance of compliance   Assessed frequency of self monitored BP, reviewed home and provider office readings, reviewed treatment targets and reviewed lifestyle strategies   Reviewed addition of Farxiga to medication regimen for blood pressure, blood sugar and weight control in addition to slowing progression of CKD and lowering risks of heart attack, stroke and cardiac death , assessed  tolerance of Farxiga and reviewed major adverse side effects   Reviewed scheduled/upcoming provider appointments  Discussed plans with patient for ongoing care management follow up and provided patient with direct contact information for care management team    Diabetes:  (Status: Goal on track: YES.)- patient reports self monitored fasting CBGs are usually <130, checks 1-2 times a day.  Most recent A1C=6.1 on 10/17/21. Lab Results  Component Value Date   HGBA1C 7.0 (H) 05/15/2021  Assessed frequency of CBG testing and reviewed fasting target Reviewed medications, including recent addition of Farxiga to medication regimen with patient and discussed importance of good medication taking behavior Discussed plans with patient for ongoing care management follow up and provided patient with direct contact information for care management team;               Health Maintenance (Status: Condition stable. Not addressed this visit.) - patient completed colonoscopy and endoscopy on 02/26/21 and was aware of pathology report- non cancerous Patient interviewed about adult health maintenance status   Pneumonia Vaccine-  received pneumococcal  vaccine on 05/15/21 Influenza Vaccine- states he received flu shot on 02/06/21 COVID vaccination   - pt states he never received  Shingles Vaccination- received first dose on 02/06/21, did not receive 2nd vaccination so will message provider prior to his appointment on May 11,2023-second shingles vaccine received 10/17/21 Regular eye checkups- says he sees his eye doctor, Dr [name],  every 6 months due to glaucoma and his next appointment is in November  Advanced Directives- states he does not have and does not want information at this time  Dental Care- states his dentist is Dr Mina Marble in [place] and he sees her once-twice yearly   Hyperlipidemia:  (Status: Condition stable. Not addressed this visit. Lab Results  Component Value Date   CHOL 162 05/15/2021   HDL 48  05/15/2021   LDLCALC 88 05/15/2021   TRIG 147 05/15/2021   CHOLHDL 6.1 (H) 12/20/2019     Medication review performed; medication list updated in electronic medical record.   Hypertension: (Status: Goal on Track (progressing): YES.)-patient states he checks his BP daily and that readings are usually <130/<80.  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 Last practice recorded BP readings:  BP Readings from Last 3 Encounters:  08/01/21 107/76  06/26/21 110/76  06/19/21 116/76    Most recent eGFR/CrCl:  Lab Results  Component Value Date   NA 137 05/15/2021   K 4.1 05/15/2021   CREATININE 1.63 (H) 05/15/2021   EGFR 51 (L) 05/15/2021  GFRNONAA 37 (L) 01/10/2020   GLUCOSE 128 (H) 05/15/2021   Evaluation of current treatment plan related to hypertension self management and patient's adherence to plan as established by provider;   Reviewed BP medications and assessed medication taking behavior ensuring he is taking Losartan that was recently added to his regimen Assessed frequency of self monitored blood press and readings, reviewed treatment targets Discussed plans with patient for ongoing care management follow up and provided patient with direct contact information for care management team; Reviewed scheduled/upcoming provider appointments  Pain:  (Status: Goal on track: Yes. Medications reviewed Reviewed provider established plan for pain management; Counseled on the importance of reporting any/all new or changed pain symptoms or management strategies to pain management provider;  Weight Loss Interventions:  (Status:  New goal.) Long Term Goal Provided verbal and/or written education to patient re: provider recommended life style modifications  Reviewed recommended dietary changes: avoid fad diets, make small/incremental dietary and exercise changes, eat at the table and avoid eating in front of the TV, plan management of cravings,  monitor snacking and cravings in food diary Congratulated patient on good medication taking behavior, ongoing weight loss  and 30-40 minutes of Nu Step home exercise each evening Reviewed [place] benefit related to assisting with weight loss Encouraged patient to reschedule with [name] for medical nutrition therapy  Patient Goals/Self-Care Activities: Take medications as prescribed   Attend all scheduled provider appointments Call pharmacy for medication refills 3-7 days in advance of running out of medications Perform all self care activities independently  Perform IADL's (shopping, preparing meals, housekeeping, managing finances) independently Call provider office for new concerns or questions  Call and schedule dentist appointment with [name] for check up and cleaning as soon as possible , phone number [phone] Reschedule a follow up appointment with [name], dietician and [place], to help with ongoing weight loss   Long-Range Goal: Establish Plan of Care for Chronic Disease Management Needs   Priority: High  Note:   Timeframe:  Long-Range Goal Priority:  High Start Date:     10/21/21                        Expected End Date:  ongoing                     Follow Up Date: 04/13/22   - schedule appointment for vaccines needed due to my age or health - schedule recommended health tests (blood work, mammogram, colonoscopy, pap test) - schedule and keep appointment for annual check-up    Why is this important?   Screening tests can find diseases early when they are easier to treat.  Your doctor or nurse will talk with you about which tests are important for you.  Getting shots for common diseases like the flu and shingles will help prevent them.  03/13/22:  Attending PT 2 times a week, PCP and Nephrology appointments completed in September    Follow Up:  Patient agrees to Care Plan and Follow-up.  Plan: The Managed Medicaid care management team will reach out to  the patient again over the next 30 business  days. and The  Patient has been provided with contact information for the Managed Medicaid care management team and has been advised to call with any health related questions or concerns.  Date/time of next scheduled RN care management/care coordination outreach: 04/13/22 at 1030.

## 2022-03-16 ENCOUNTER — Ambulatory Visit (HOSPITAL_COMMUNITY): Payer: Medicaid Other | Admitting: Physical Therapy

## 2022-03-16 ENCOUNTER — Encounter (HOSPITAL_COMMUNITY): Payer: Self-pay | Admitting: Physical Therapy

## 2022-03-16 DIAGNOSIS — M25551 Pain in right hip: Secondary | ICD-10-CM | POA: Diagnosis not present

## 2022-03-16 DIAGNOSIS — R2689 Other abnormalities of gait and mobility: Secondary | ICD-10-CM

## 2022-03-16 DIAGNOSIS — M6281 Muscle weakness (generalized): Secondary | ICD-10-CM

## 2022-03-16 NOTE — Therapy (Signed)
OUTPATIENT PHYSICAL THERAPY LOWER EXTREMITY Treatment   Patient Name: [name] MRN: [id number] DOB:1970-07-04, 51 y.o., male Today's Date: 03/16/2022   PT End of Session - 03/16/22 1344     Visit Number 3    Number of Visits 12    Date for PT Re-Evaluation 04/22/22    Authorization Type Medicaid [employer]    Authorization Time Period healthy blue approved 6 visit 10/18-12/16    Authorization - Visit Number 3    Authorization - Number of Visits 6    Progress Note Due on Visit 10    PT Start Time 1302    PT Stop Time 1344    PT Time Calculation (min) 42 min    Activity Tolerance Patient tolerated treatment well;No increased pain    Behavior During Therapy [facility] for tasks assessed/performed              Past Medical History:  Diagnosis Date   ALLERGY 08/03/2008   Qualifier: Diagnosis of  By: Christ Kick     Anemia    Arthritis    "hips" (01/15/2015)   Blood transfusion without reported diagnosis    Cellulitis 07/25/2018   Chronic back pain greater than 3 months duration 02/11/2017   Chronic lower back pain    Colon polyps    COLONIC POLYPS, ADENOMATOUS 08/03/2008   Qualifier: Diagnosis of  By: Christ Kick     CVA 08/03/2008   Qualifier: History of  By: Christ Kick     Depression    denies   Diabetes mellitus    diet controlled- no meds since wt loss   Diabetes mellitus ([place]) 07/10/2019   Dysrhythmia    s/p surgery bariatric- cardioversion   Erectile dysfunction 01/26/2018   GERD (gastroesophageal reflux disease)    HIATAL HERNIA 08/03/2008   Qualifier: Diagnosis of  By: Christ Kick     Hip pain    History of gout    Hyperlipidemia    Hypertension    Joint pain    Narcotic dependence ([place]) 02/11/2017   Neuromuscular disorder ([place])    PONV (postoperative nausea and vomiting)    Prediabetes 03/29/2017   Sleep apnea    no longer have to use cpap since wt loss   Stage 3 chronic kidney disease ([place]) 01/26/2018   Stroke ([place])  2006   denies residual on 01/15/2015   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 ([place]) 08/01/2018   Past Surgical History:  Procedure Laterality Date   BARIATRIC SURGERY  2010   in [place]  02/26/2021   Procedure: BIOPSY;  Surgeon: Harvel Quale, MD;  [facility];  Service: Gastroenterology;;   CARDIOVERSION     COLONOSCOPY N/A 08/02/2014   Procedure: COLONOSCOPY;  Surgeon: Rogene Houston, MD;  [facility];  Service: Endoscopy;  Laterality: N/A;  210 - moved to 3/10 @ 11:55 - Ann notified pt   COLONOSCOPY WITH PROPOFOL N/A 02/26/2021   Procedure: COLONOSCOPY WITH PROPOFOL;  Surgeon: Harvel Quale, MD;  [facility];  Service: Gastroenterology;  Laterality: N/A;  10:00   ESOPHAGOGASTRODUODENOSCOPY     ESOPHAGOGASTRODUODENOSCOPY (EGD) WITH PROPOFOL N/A 02/26/2021   Procedure: ESOPHAGOGASTRODUODENOSCOPY (EGD) WITH PROPOFOL;  Surgeon: Harvel Quale, MD;  [facility];  Service: Gastroenterology;  Laterality: N/A;   HERNIA REPAIR     LIPOSUCTION     PANNICULECTOMY  01/14/2015   PANNICULECTOMY N/A 01/14/2015   Procedure:  TOTAL PANNICULECTOMY [facility]  LIPOSUCTION OF SIDES;  Surgeon: Cristine Polio, MD;  Location: [place];  Service: Plastics;  Laterality: N/A;   POLYPECTOMY  02/26/2021   Procedure: POLYPECTOMY INTESTINAL;  Surgeon: Harvel Quale, MD;  [facility];  Service: Gastroenterology;;   TONSILLECTOMY     TOTAL HIP ARTHROPLASTY Left 12/16/2021   Procedure: TOTAL HIP ARTHROPLASTY ANTERIOR APPROACH;  Surgeon: Renette Butters, MD;  Location: [facility];  Service: Orthopedics;  Laterality: Left;   TOTAL HIP ARTHROPLASTY Right 03/03/2022   Procedure: TOTAL HIP ARTHROPLASTY ANTERIOR APPROACH;  Surgeon: Renette Butters, MD;  Location[facility];  Service: Orthopedics;  Laterality: Right;   VENTRAL HERNIA REPAIR  01/14/2015   VENTRAL HERNIA REPAIR N/A 01/14/2015   Procedure: HERNIA REPAIR  VENTRAL ADULT AND MUSCLE REPAIR;  Surgeon: Cristine Polio, MD;  Location: [place];  Service: Plastics;  Laterality: N/A;   Patient Active Problem List   Diagnosis Date Noted   S/P total right hip arthroplasty 03/03/2022   Annual visit for general adult medical examination with abnormal findings 02/17/2022   S/P total left hip arthroplasty 12/16/2021   Need for varicella vaccine 10/16/2021   OSA  09/11/2021   Pre-operative clearance 06/19/2021   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   Immunization due 02/06/2021   Iron deficiency anemia 12/30/2020   History of colonic polyps 12/30/2020   Unilateral primary osteoarthritis, right hip 08/13/2020   Unilateral primary osteoarthritis, left hip 08/13/2020   Muscle spasms of both lower extremities 05/23/2020   Need for immunization against influenza 03/06/2020   Primary osteoarthritis of both hips 07/10/2019   Bilateral primary osteoarthritis of hip 07/10/2019   Vitamin D deficiency 04/13/2019   Morbid obesity with body mass index (BMI) of 40.0 to 44.9 in adult ([place]) 04/13/2019   Depressive disorder 04/13/2019   Body mass index (BMI) 45.0-49.9, adult ([place]) 04/13/2019   Kidney disease, chronic, stage III (GFR 30-59 ml/min) ([place]) 07/20/2017   History of cerebrovascular accident 07/20/2017   Gastroesophageal reflux disease 07/20/2017   Gastric bypass status for obesity 02/11/2017   Benzodiazepine dependence ([place]) 02/11/2017   Type 2 diabetes mellitus with diabetic nephropathy ([place]) 08/03/2008   Hyperlipidemia 08/03/2008   High blood pressure 08/03/2008   SLEEP APNEA 08/03/2008   Sleep apnea 08/03/2008    PCP: Dr. Doren Custard  REFERRING PROVIDER:  Renette Butters, MD  REFERRING DIAG: post op rt THR (DOS 03/03/2022), anterior approach  THERAPY DIAG:  Muscle weakness, Right hip pain, difficulty in walking   Rationale for Evaluation and Treatment Rehabilitation  ONSET DATE:  03/03/22  SUBJECTIVE:   SUBJECTIVE STATEMENT:  Pt states that he has no questions on his HEP.  He is walking about 15 minutes.    PERTINENT HISTORY:  Rt THR (DOS 03/03/2022)DM, HTN, Lt THR, OA   PAIN:  Are you having pain? Yes: NPRS scale: 3/10 Pain location: Rt hip  Pain description: throb Aggravating factors: walking  Relieving factors: rest   PRECAUTIONS: Anterior hip  WEIGHT BEARING RESTRICTIONS No  FALLS:  Has patient fallen in last 6 months? No  LIVING ENVIRONMENT: Lives with: lives with their family Lives in: House/apartment Stairs: No Has following equipment at home: Single point cane, Environmental consultant - 2 wheeled, Electronics engineer, and Grab bars  OCCUPATION: disability   PLOF: Independent with basic ADLs  PATIENT GOALS PT just wants to be able to walk right, go to the gym    OBJECTIVE:    POSTURE: No Significant postural limitations  LOWER EXTREMITY ROM:  Active ROM Right eval Left eval  Hip flexion  50  Hip extension    Hip abduction    Hip adduction    Hip internal rotation    Hip external rotation    Knee flexion    Knee extension    Ankle dorsiflexion    Ankle plantarflexion    Ankle inversion    Ankle eversion     (Blank rows = not tested)  LOWER EXTREMITY MMT: ( Pt surgery yesterday 10/10; therefore mm tested not completed for hip.  Pt only able to lift Rt leg up 6")  MMT Right eval Left eval  Hip flexion  2+  Hip extension    Hip abduction    Hip adduction    Hip internal rotation    Hip external rotation    Knee flexion    Knee extension  3+  Ankle dorsiflexion 5 5  Ankle plantarflexion    Ankle inversion    Ankle eversion     (Blank rows = not tested)    FUNCTIONAL TESTS:  30 seconds chair stand test:  hands on thigh 2 x  2 minute walk test: 207 ft with RW Single leg stance:  unable on either leg     TODAY'S TREATMENT:                         03/16/22                         Standing:                         Wall arch  x 10                         Side stepping 2 RT                         Functional squat x 10                         Step up 4" box x 10                         Lateral step up x 10                          Lt forward lunge x 10                          Single leg stance:  3 reps each leg max 4"                          Tandem stance x 5                          Seated:                           Sit to stand x 15                          Supine:  Lt knee to chest x 5                          Bridge hold 5' x 10 2 sets                          Side lying:                         Clam x 10                         Hip abduction x 10   03/11/22 Ambulation 200 feet with walking stick Sit to stand from standard chair no UE 5X Standing intermittent HHA: Heelraises/Toeraises 20X each  Hip abduction 2X10 each  Hip extension 2X10 each  Tandem stance X30"each LE lead with max of 4" without UE assist Seated:  LAQ 20x3" holds Supine:  Bridge 20X  SLR 2X10  Heelslide Rt only 10X    Evaluation             Sitting :  RT LAQ x 10            Supine:  quad set x 10                          Heel slide x 5                          SLR x 5   PATIENT EDUCATION:  Education details: HEP  Person educated: Patient Education method: Explanation and Handouts Education comprehension: verbalized understanding   HOME EXERCISE PROG RAM:             03/16/22             Access Code: [email or web address] URL: [email or web address] Date: 03/16/2022 Prepared by: Rayetta Humphrey  Exercises - Heel Raise  - 2 x daily - 7 x weekly - 1 sets - 10 reps - 3-5" hold - Mini Squat with Counter Support  - 2 x daily - 7 x weekly - 1 sets - 10 reps - 3-5" hold - Sit to Stand  - 2 x daily - 7 x weekly - 1 sets - 10 reps - Supine Single Knee to Chest Stretch  - 2 x daily - 7 x weekly - 1 sets - 5 reps - 10" hold - Supine Bridge  - 2 x daily - 7 x weekly - 1 sets - 15 reps - 5" hold -  Sidelying Hip Abduction  - 2 x daily - 7 x weekly - 1 sets - 10 reps - 3-5" hold Access [email or web address] URL: [email or web address] Date: 03/05/2022 Prepared by: Rayetta Humphrey  Exercises - Seated Knee Extension AROM  - 3 x daily - 7 x weekly - 1 sets - 10 reps - 3-5" hold - Supine Quadricep Sets  - 3 x daily - 7 x weekly - 1 sets - 10 reps - 3-5" hold - Supine Heel Slide  - 3 x daily - 7 x weekly - 1 sets - 10 reps - 3-5" hold - Small Range Straight Leg Raise  - 3 x daily - 7 x weekly - 1 sets - 10 reps - 3" hold  ASSESSMENT:  CLINICAL IMPRESSION: Treatment session focused on strengthening and stretching of Lt LE.  PT needed  several small breaks due to activity tolerance deficit. Therapist urged pt to walk more at home.    Therapist updated HEP>   Pt will benefit from skilled PT to address deficits and maximize his functional ability.     OBJECTIVE IMPAIRMENTS Abnormal gait, decreased activity tolerance, decreased balance, decreased mobility, difficulty walking, decreased ROM, decreased strength, and pain.   ACTIVITY LIMITATIONS carrying, lifting, bending, sitting, standing, squatting, bed mobility, and locomotion level  PARTICIPATION LIMITATIONS: meal prep, cleaning, laundry, driving, shopping, community activity, and yard work  PERSONAL FACTORS 1-2 comorbidities: DM, OA with LT THR  are also affecting patient's functional outcome.   REHAB POTENTIAL: Good  CLINICAL DECISION MAKING: Stable/uncomplicated  EVALUATION COMPLEXITY: Low   GOALS: Goals reviewed with patient? Yes  SHORT TERM GOALS: Target date: 04/01/2022  Pt to be I in HEP to allow hip flexion to be to 90 degrees to be able to sit in comfort for an hour.  Baseline: Goal status: IN PROGRESS  2.  PT mm strength to increase by 1/2 grade to be able to come sit to stand from a chair without UE with ease.  Baseline:  Goal status: IN PROGRESS  3.  PT to be able to single leg stance on B0th LE for at least  5 seconds to decrease risk of falling  Baseline:  Goal status: IN PROGRESS  4.  Pt to be ambulating with a cane Baseline:  Goal status: MET   LONG TERM GOALS: Target date: 04/22/2022   PT to be I in advanced HEP to be able  to improve hip ROM to be able to squat down and pick items off the floor  Baseline:  Goal status: IN PROGRESS  2.  Pt strength to be increased one grade to allow pt to go up and down 6 steps in a reciprocal manner with one hand hold  Baseline:  Goal status: IN PROGRESS  3.  Pt to be able to single leg stance for 10 seconds B for improved safety when walking on uneven terrain.  Baseline:  Goal status: IN PROGRESS  4.  PT to be ambulating without assistive device  Baseline:  Goal status: IN PROGRESS  5.  PT pain to be no greater than a 1/10 throughout the day  Baseline:  Goal status: IN PROGRESS  PLAN: PT FREQUENCY: 2x/week  PT DURATION: 6 weeks  PLANNED INTERVENTIONS: Therapeutic exercises, Therapeutic activity, Balance training, Gait training, Patient/Family education, Self Care, and Manual therapy  PLAN FOR NEXT SESSION: Continue with ROM, strength, and balance. Progress to vector, gait without AD and stairs when able. Rayetta Humphrey, PT CLT [phone]  03/16/2022, 1:49 PM

## 2022-03-18 ENCOUNTER — Ambulatory Visit (HOSPITAL_COMMUNITY): Payer: Medicaid Other | Admitting: Physical Therapy

## 2022-03-18 DIAGNOSIS — M25551 Pain in right hip: Secondary | ICD-10-CM | POA: Diagnosis not present

## 2022-03-18 DIAGNOSIS — R2689 Other abnormalities of gait and mobility: Secondary | ICD-10-CM

## 2022-03-18 DIAGNOSIS — M6281 Muscle weakness (generalized): Secondary | ICD-10-CM

## 2022-03-18 NOTE — Therapy (Signed)
OUTPATIENT PHYSICAL THERAPY LOWER EXTREMITY Treatment   Patient Name: [name] MRN: [id number] DOB:Mar 25, 1971, 51 y.o., male Today's Date: 03/18/2022   PT End of Session - 03/16/22 1344     Visit Number 3    Number of Visits 12    Date for PT Re-Evaluation 04/22/22    Authorization Type Medicaid [employer]    Authorization Time Period healthy blue approved 6 visit 10/18-12/16    Authorization - Visit Number 3    Authorization - Number of Visits 6    Progress Note Due on Visit 10    PT Start Time 1302    PT Stop Time 1344    PT Time Calculation (min) 42 min    Activity Tolerance Patient tolerated treatment well;No increased pain    Behavior During Therapy [facility] for tasks assessed/performed              Past Medical History:  Diagnosis Date   ALLERGY 08/03/2008   Qualifier: Diagnosis of  By: Christ Kick     Anemia    Arthritis    "hips" (01/15/2015)   Blood transfusion without reported diagnosis    Cellulitis 07/25/2018   Chronic back pain greater than 3 months duration 02/11/2017   Chronic lower back pain    Colon polyps    COLONIC POLYPS, ADENOMATOUS 08/03/2008   Qualifier: Diagnosis of  By: Christ Kick     CVA 08/03/2008   Qualifier: History of  By: Christ Kick     Depression    denies   Diabetes mellitus    diet controlled- no meds since wt loss   Diabetes mellitus ([place]) 07/10/2019   Dysrhythmia    s/p surgery bariatric- cardioversion   Erectile dysfunction 01/26/2018   GERD (gastroesophageal reflux disease)    HIATAL HERNIA 08/03/2008   Qualifier: Diagnosis of  By: Christ Kick     Hip pain    History of gout    Hyperlipidemia    Hypertension    Joint pain    Narcotic dependence ([place]) 02/11/2017   Neuromuscular disorder ([place])    PONV (postoperative nausea and vomiting)    Prediabetes 03/29/2017   Sleep apnea    no longer have to use cpap since wt loss   Stage 3 chronic kidney disease ([place]) 01/26/2018   Stroke ([place])  2006   denies residual on 01/15/2015   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 ([place]) 08/01/2018   Past Surgical History:  Procedure Laterality Date   BARIATRIC SURGERY  2010   in [place]  02/26/2021   Procedure: BIOPSY;  Surgeon: Harvel Quale, MD;  [facility];  Service: Gastroenterology;;   CARDIOVERSION     COLONOSCOPY N/A 08/02/2014   Procedure: COLONOSCOPY;  Surgeon: Rogene Houston, MD;  [facility];  Service: Endoscopy;  Laterality: N/A;  210 - moved to 3/10 @ 11:55 - Ann notified pt   COLONOSCOPY WITH PROPOFOL N/A 02/26/2021   Procedure: COLONOSCOPY WITH PROPOFOL;  Surgeon: Harvel Quale, MD;  [facility];  Service: Gastroenterology;  Laterality: N/A;  10:00   ESOPHAGOGASTRODUODENOSCOPY     ESOPHAGOGASTRODUODENOSCOPY (EGD) WITH PROPOFOL N/A 02/26/2021   Procedure: ESOPHAGOGASTRODUODENOSCOPY (EGD) WITH PROPOFOL;  Surgeon: Harvel Quale, MD;  [facility];  Service: Gastroenterology;  Laterality: N/A;   HERNIA REPAIR     LIPOSUCTION     PANNICULECTOMY  01/14/2015   PANNICULECTOMY N/A 01/14/2015   Procedure:  TOTAL PANNICULECTOMY [facility]  LIPOSUCTION OF SIDES;  Surgeon: Cristine Polio, MD;  Location: [facility];  Service: Plastics;  Laterality: N/A;   POLYPECTOMY  02/26/2021   Procedure: POLYPECTOMY INTESTINAL;  Surgeon: Harvel Quale, MD;  [facility];  Service: Gastroenterology;;   TONSILLECTOMY     TOTAL HIP ARTHROPLASTY Left 12/16/2021   Procedure: TOTAL HIP ARTHROPLASTY ANTERIOR APPROACH;  Surgeon: Renette Butters, MD;  [facility]: [facility];  Service: Orthopedics;  Laterality: Left;   TOTAL HIP ARTHROPLASTY Right 03/03/2022   Procedure: TOTAL HIP ARTHROPLASTY ANTERIOR APPROACH;  Surgeon: Renette Butters, MD;  [facility];  Service: Orthopedics;  Laterality: Right;   VENTRAL HERNIA REPAIR  01/14/2015   VENTRAL HERNIA REPAIR N/A 01/14/2015   Procedure: HERNIA REPAIR  VENTRAL ADULT AND MUSCLE REPAIR;  Surgeon: Cristine Polio, MD;  Location: [facility];  Service: Plastics;  Laterality: N/A;   Patient Active Problem List   Diagnosis Date Noted   S/P total right hip arthroplasty 03/03/2022   Annual visit for general adult medical examination with abnormal findings 02/17/2022   S/P total left hip arthroplasty 12/16/2021   Need for varicella vaccine 10/16/2021   OSA  09/11/2021   Pre-operative clearance 06/19/2021   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   Immunization due 02/06/2021   Iron deficiency anemia 12/30/2020   History of colonic polyps 12/30/2020   Unilateral primary osteoarthritis, right hip 08/13/2020   Unilateral primary osteoarthritis, left hip 08/13/2020   Muscle spasms of both lower extremities 05/23/2020   Need for immunization against influenza 03/06/2020   Primary osteoarthritis of both hips 07/10/2019   Bilateral primary osteoarthritis of hip 07/10/2019   Vitamin D deficiency 04/13/2019   Morbid obesity with body mass index (BMI) of 40.0 to 44.9 in adult ([place]) 04/13/2019   Depressive disorder 04/13/2019   Body mass index (BMI) 45.0-49.9, adult ([place]) 04/13/2019   Kidney disease, chronic, stage III (GFR 30-59 ml/min) ([place]) 07/20/2017   History of cerebrovascular accident 07/20/2017   Gastroesophageal reflux disease 07/20/2017   Gastric bypass status for obesity 02/11/2017   Benzodiazepine dependence ([place]) 02/11/2017   Type 2 diabetes mellitus with diabetic nephropathy ([place]) 08/03/2008   Hyperlipidemia 08/03/2008   High blood pressure 08/03/2008   SLEEP APNEA 08/03/2008   Sleep apnea 08/03/2008    PCP: Dr. Doren Custard  REFERRING PROVIDER:  Renette Butters, MD  REFERRING DIAG: post op rt THR (DOS 03/03/2022), anterior approach  THERAPY DIAG:  Muscle weakness, Right hip pain, difficulty in walking   Rationale for Evaluation and Treatment Rehabilitation  ONSET DATE:  03/03/22  SUBJECTIVE:   SUBJECTIVE STATEMENT:  Pt states he is doing well overall, walks mostly around the house without AD but his mother is still staying with him. States he goes outside and walks around 15 minutes with walking stick and using his nustep at home also.  Only having soreness in Rt hip, no pain.      PERTINENT HISTORY:  Rt THR (DOS 03/03/2022), DM, HTN, Lt THR, OA   PAIN:  Are you having pain? Yes: NPRS scale: 3/10 Pain location: Rt hip  Pain description: throb Aggravating factors: walking  Relieving factors: rest   PRECAUTIONS: Anterior hip  WEIGHT BEARING RESTRICTIONS No  FALLS:  Has patient fallen in last 6 months? No  LIVING ENVIRONMENT: Lives with: lives with their family Lives in: House/apartment Stairs: No Has following equipment at home: Single point cane, Environmental consultant - 2 wheeled, Electronics engineer, and Grab bars  OCCUPATION:  disability   PLOF: Independent with basic ADLs  PATIENT GOALS PT just wants to be able to walk right, go to the gym    OBJECTIVE:    POSTURE: No Significant postural limitations    LOWER EXTREMITY ROM:  Active ROM Right eval Left eval  Hip flexion  50  Hip extension    Hip abduction    Hip adduction    Hip internal rotation    Hip external rotation    Knee flexion    Knee extension    Ankle dorsiflexion    Ankle plantarflexion    Ankle inversion    Ankle eversion     (Blank rows = not tested)  LOWER EXTREMITY MMT: ( Pt surgery yesterday 10/10; therefore mm tested not completed for hip.  Pt only able to lift Rt leg up 6")  MMT Right eval Left eval  Hip flexion  2+  Hip extension    Hip abduction    Hip adduction    Hip internal rotation    Hip external rotation    Knee flexion    Knee extension  3+  Ankle dorsiflexion 5 5  Ankle plantarflexion    Ankle inversion    Ankle eversion     (Blank rows = not tested)    FUNCTIONAL TESTS:  30 seconds chair stand test:  hands on thigh 2 x  2 minute walk  test: 207 ft with RW Single leg stance:  unable on either leg     TODAY'S TREATMENT:                       03/18/22 Sit to stands no UE standard chair 10X Standing: heelraises 20X  Forward step ups 4" 10X each  Lateral step ups 4" 10X each  Forward lunges onto 4" step 1 UE assist 10X each  Vectors with 1 HHA 5X5" each LE Ambulation without AD in hallway X 150 feet (cues for stab) Side stepping 12 feet 3RT without UE assist Stair negotiation 1 flight up, 3 flights down reciprocally with 1 HR  03/16/22                         Standing:                         Wall arch x 10                         Side stepping 2 RT                         Functional squat x 10                         Step up 4" box x 10                         Lateral step up x 10                          Lt forward lunge x 10                          Single leg stance:  3 reps each leg max 4"  Tandem stance x 5                          Seated:                           Sit to stand x 15                          Supine:                           Lt knee to chest x 5                          Bridge hold 5' x 10 2 sets                          Side lying:                         Clam x 10                         Hip abduction x 10   03/11/22 Ambulation 200 feet with walking stick Sit to stand from standard chair no UE 5X Standing intermittent HHA: Heelraises/Toeraises 20X each  Hip abduction 2X10 each  Hip extension 2X10 each  Tandem stance X30"each LE lead with max of 4" without UE assist Seated:  LAQ 20x3" holds Supine:  Bridge 20X  SLR 2X10  Heelslide Rt only 10X    Evaluation             Sitting :  RT LAQ x 10            Supine:  quad set x 10                          Heel slide x 5                          SLR x 5   PATIENT EDUCATION:  Education details: HEP  Person educated: Patient Education method: Explanation and Handouts Education comprehension: verbalized  understanding   HOME EXERCISE PROG RAM:             03/16/22             Access Code: [email or web address] URL: [email or web address] Date: 03/16/2022 Prepared by: Rayetta Humphrey  Exercises - Heel Raise  - 2 x daily - 7 x weekly - 1 sets - 10 reps - 3-5" hold - Mini Squat with Counter Support  - 2 x daily - 7 x weekly - 1 sets - 10 reps - 3-5" hold - Sit to Stand  - 2 x daily - 7 x weekly - 1 sets - 10 reps - Supine Single Knee to Chest Stretch  - 2 x daily - 7 x weekly - 1 sets - 5 reps - 10" hold - Supine Bridge  - 2 x daily - 7 x weekly - 1 sets - 15 reps - 5" hold - Sidelying Hip Abduction  - 2 x daily - 7 x weekly - 1 sets - 10 reps - 3-5" hold Access [email or web address] URL: [email or web address] Date: 03/05/2022 Prepared by:  Rayetta Humphrey  Exercises - Seated Knee Extension AROM  - 3 x daily - 7 x weekly - 1 sets - 10 reps - 3-5" hold - Supine Quadricep Sets  - 3 x daily - 7 x weekly - 1 sets - 10 reps - 3-5" hold - Supine Heel Slide  - 3 x daily - 7 x weekly - 1 sets - 10 reps - 3-5" hold - Small Range Straight Leg Raise  - 3 x daily - 7 x weekly - 1 sets - 10 reps - 3" hold  ASSESSMENT:  CLINICAL IMPRESSION:  Began session with standing strengthening exercises.  Pt required cues for stab and increasing hold times with added vectors.  Several instances of needing both UE to stabilize when balance challenged but mostly able to complete with 1 UE assist.  Began working on ambulation without AD with cues to reduce "wobbling" with upper body.  More compensation of upper body to Rt when trying to stabilize on Rt LE.  Ended session working on stair negotiation in stairwell completing 1 flight up reciprocally and 3 flights down reciprocally.  Decreased eccentric control of Rt quad with Lt LE step down.   PT needed several small breaks due to fatigue.  Pt will benefit from skilled PT to address deficits and maximize his functional ability.     OBJECTIVE IMPAIRMENTS  Abnormal gait, decreased activity tolerance, decreased balance, decreased mobility, difficulty walking, decreased ROM, decreased strength, and pain.   ACTIVITY LIMITATIONS carrying, lifting, bending, sitting, standing, squatting, bed mobility, and locomotion level  PARTICIPATION LIMITATIONS: meal prep, cleaning, laundry, driving, shopping, community activity, and yard work  PERSONAL FACTORS 1-2 comorbidities: DM, OA with LT THR  are also affecting patient's functional outcome.   REHAB POTENTIAL: Good  CLINICAL DECISION MAKING: Stable/uncomplicated  EVALUATION COMPLEXITY: Low   GOALS: Goals reviewed with patient? Yes  SHORT TERM GOALS: Target date: 04/01/2022  Pt to be I in HEP to allow hip flexion to be to 90 degrees to be able to sit in comfort for an hour.  Baseline: Goal status: IN PROGRESS  2.  PT mm strength to increase by 1/2 grade to be able to come sit to stand from a chair without UE with ease.  Baseline:  Goal status: IN PROGRESS  3.  PT to be able to single leg stance on B0th LE for at least 5 seconds to decrease risk of falling  Baseline:  Goal status: IN PROGRESS  4.  Pt to be ambulating with a cane Baseline:  Goal status: MET   LONG TERM GOALS: Target date: 04/22/2022   PT to be I in advanced HEP to be able  to improve hip ROM to be able to squat down and pick items off the floor  Baseline:  Goal status: IN PROGRESS  2.  Pt strength to be increased one grade to allow pt to go up and down 6 steps in a reciprocal manner with one hand hold  Baseline:  Goal status: IN PROGRESS  3.  Pt to be able to single leg stance for 10 seconds B for improved safety when walking on uneven terrain.  Baseline:  Goal status: IN PROGRESS  4.  PT to be ambulating without assistive device  Baseline:  Goal status: IN PROGRESS  5.  PT pain to be no greater than a 1/10 throughout the day  Baseline:  Goal status: IN PROGRESS  PLAN: PT FREQUENCY: 2x/week  PT DURATION: 6  weeks  PLANNED INTERVENTIONS: Therapeutic  exercises, Therapeutic activity, Balance training, Gait training, Patient/Family education, Self Care, and Manual therapy  PLAN FOR NEXT SESSION: Continue with ROM, strength, and balance.  Increase functional stability.     Teena Irani, PTA/CLT [place] Ph: [phone]  03/18/2022, 1:49 PM

## 2022-03-20 ENCOUNTER — Ambulatory Visit (HOSPITAL_COMMUNITY): Payer: Medicaid Other | Admitting: Physical Therapy

## 2022-03-20 DIAGNOSIS — M25551 Pain in right hip: Secondary | ICD-10-CM

## 2022-03-20 DIAGNOSIS — M6281 Muscle weakness (generalized): Secondary | ICD-10-CM

## 2022-03-20 DIAGNOSIS — R2689 Other abnormalities of gait and mobility: Secondary | ICD-10-CM

## 2022-03-20 NOTE — Therapy (Signed)
OUTPATIENT PHYSICAL THERAPY LOWER EXTREMITY Treatment   Patient Name: [name] MRN: [id number] DOB:07-18-1970, 51 y.o., male Today's Date: 03/20/2022    PT End of Session - 03/20/22 1345    Visit Number 5    Number of Visits 12    Date for PT Re-Evaluation 04/22/22    Authorization Type Medicaid [employer]    Authorization Time Period healthy blue approved 6 visit 10/18-12/16    Authorization - Visit Number 4   Authorization - Number of Visits 6    Progress Note Due on Visit 10    PT Start Time 1300    PT Stop Time 1345    PT Time Calculation (min) 45 min    Activity Tolerance Patient tolerated treatment well;No increased pain    Behavior During Therapy [facility] for tasks assessed/performed                    Past Medical History:  Diagnosis Date   ALLERGY 08/03/2008   Qualifier: Diagnosis of  By: Christ Kick     Anemia    Arthritis    "hips" (01/15/2015)   Blood transfusion without reported diagnosis    Cellulitis 07/25/2018   Chronic back pain greater than 3 months duration 02/11/2017   Chronic lower back pain    Colon polyps    COLONIC POLYPS, ADENOMATOUS 08/03/2008   Qualifier: Diagnosis of  By: Christ Kick     CVA 08/03/2008   Qualifier: History of  By: Christ Kick     Depression    denies   Diabetes mellitus    diet controlled- no meds since wt loss   Diabetes mellitus ([place]) 07/10/2019   Dysrhythmia    s/p surgery bariatric- cardioversion   Erectile dysfunction 01/26/2018   GERD (gastroesophageal reflux disease)    HIATAL HERNIA 08/03/2008   Qualifier: Diagnosis of  By: Christ Kick     Hip pain    History of gout    Hyperlipidemia    Hypertension    Joint pain    Narcotic dependence ([place]) 02/11/2017   Neuromuscular disorder ([place])    PONV (postoperative nausea and vomiting)    Prediabetes 03/29/2017   Sleep apnea    no longer have to use cpap since wt loss   Stage 3 chronic kidney disease ([place]) 01/26/2018   Stroke  ([place]) 2006   denies residual on 01/15/2015   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 ([place]) 08/01/2018   Past Surgical History:  Procedure Laterality Date   BARIATRIC SURGERY  2010   in [place]  02/26/2021   Procedure: BIOPSY;  Surgeon: Harvel Quale, MD;  [facility];  Service: Gastroenterology;;   CARDIOVERSION     COLONOSCOPY N/A 08/02/2014   Procedure: COLONOSCOPY;  Surgeon: Rogene Houston, MD;  [facility];  Service: Endoscopy;  Laterality: N/A;  210 - moved to 3/10 @ 11:55 - Ann notified pt   COLONOSCOPY WITH PROPOFOL N/A 02/26/2021   Procedure: COLONOSCOPY WITH PROPOFOL;  Surgeon: Harvel Quale, MD;  [facility];  Service: Gastroenterology;  Laterality: N/A;  10:00   ESOPHAGOGASTRODUODENOSCOPY     ESOPHAGOGASTRODUODENOSCOPY (EGD) WITH PROPOFOL N/A 02/26/2021   Procedure: ESOPHAGOGASTRODUODENOSCOPY (EGD) WITH PROPOFOL;  Surgeon: Harvel Quale, MD;  [facility];  Service: Gastroenterology;  Laterality: N/A;   HERNIA REPAIR     LIPOSUCTION     PANNICULECTOMY  01/14/2015   PANNICULECTOMY N/A 01/14/2015  Procedure:  TOTAL PANNICULECTOMY WTH LIPOSUCTION OF SIDES;  Surgeon: Cristine Polio, MD;  Location: [place];  Service: Plastics;  Laterality: N/A;   POLYPECTOMY  02/26/2021   Procedure: POLYPECTOMY INTESTINAL;  Surgeon: Harvel Quale, MD;  [facility];  Service: Gastroenterology;;   TONSILLECTOMY     TOTAL HIP ARTHROPLASTY Left 12/16/2021   Procedure: TOTAL HIP ARTHROPLASTY ANTERIOR APPROACH;  Surgeon: Renette Butters, MD;  [facility];  Service: Orthopedics;  Laterality: Left;   TOTAL HIP ARTHROPLASTY Right 03/03/2022   Procedure: TOTAL HIP ARTHROPLASTY ANTERIOR APPROACH;  Surgeon: Renette Butters, MD;  [facility];  Service: Orthopedics;  Laterality: Right;   VENTRAL HERNIA REPAIR  01/14/2015   VENTRAL HERNIA REPAIR N/A 01/14/2015   Procedure: HERNIA  REPAIR VENTRAL ADULT AND MUSCLE REPAIR;  Surgeon: Cristine Polio, MD;  Location: [place];  Service: Plastics;  Laterality: N/A;   Patient Active Problem List   Diagnosis Date Noted   S/P total right hip arthroplasty 03/03/2022   Annual visit for general adult medical examination with abnormal findings 02/17/2022   S/P total left hip arthroplasty 12/16/2021   Need for varicella vaccine 10/16/2021   OSA  09/11/2021   Pre-operative clearance 06/19/2021   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   Immunization due 02/06/2021   Iron deficiency anemia 12/30/2020   History of colonic polyps 12/30/2020   Unilateral primary osteoarthritis, right hip 08/13/2020   Unilateral primary osteoarthritis, left hip 08/13/2020   Muscle spasms of both lower extremities 05/23/2020   Need for immunization against influenza 03/06/2020   Primary osteoarthritis of both hips 07/10/2019   Bilateral primary osteoarthritis of hip 07/10/2019   Vitamin D deficiency 04/13/2019   Morbid obesity with body mass index (BMI) of 40.0 to 44.9 in adult ([place]) 04/13/2019   Depressive disorder 04/13/2019   Body mass index (BMI) 45.0-49.9, adult ([place]) 04/13/2019   Kidney disease, chronic, stage III (GFR 30-59 ml/min) ([place]) 07/20/2017   History of cerebrovascular accident 07/20/2017   Gastroesophageal reflux disease 07/20/2017   Gastric bypass status for obesity 02/11/2017   Benzodiazepine dependence ([place]) 02/11/2017   Type 2 diabetes mellitus with diabetic nephropathy ([place]) 08/03/2008   Hyperlipidemia 08/03/2008   High blood pressure 08/03/2008   SLEEP APNEA 08/03/2008   Sleep apnea 08/03/2008    PCP: Dr. Doren Custard  REFERRING PROVIDER:  Renette Butters, MD  REFERRING DIAG: post op rt THR (DOS 03/03/2022), anterior approach  THERAPY DIAG:  Muscle weakness, Right hip pain, difficulty in walking   Rationale for Evaluation and Treatment Rehabilitation  ONSET DATE:  03/03/22  SUBJECTIVE:   SUBJECTIVE STATEMENT:  PT states he is walking more than doing his exercises.      PERTINENT HISTORY:  Rt THR (DOS 03/03/2022), DM, HTN, Lt THR, OA   PAIN:  Are you having pain? Yes: NPRS scale: 0/10 Pain location: Rt hip  Pain description: throb Aggravating factors: walking  Relieving factors: rest   PRECAUTIONS: Anterior hip  WEIGHT BEARING RESTRICTIONS No  FALLS:  Has patient fallen in last 6 months? No  LIVING ENVIRONMENT: Lives with: lives with their family Lives in: House/apartment Stairs: No Has following equipment at home: Single point cane, Environmental consultant - 2 wheeled, Electronics engineer, and Grab bars  OCCUPATION: disability   PLOF: Independent with basic ADLs  PATIENT GOALS PT just wants to be able to walk right, go to the gym    OBJECTIVE:    POSTURE: No Significant postural  limitations    LOWER EXTREMITY ROM:  Active ROM Right eval Left eval  Hip flexion  50  Hip extension    Hip abduction    Hip adduction    Hip internal rotation    Hip external rotation    Knee flexion    Knee extension    Ankle dorsiflexion    Ankle plantarflexion    Ankle inversion    Ankle eversion     (Blank rows = not tested)  LOWER EXTREMITY MMT: ( Pt surgery yesterday 10/10; therefore mm tested not completed for hip.  Pt only able to lift Rt leg up 6")  MMT Right eval Left eval  Hip flexion  2+  Hip extension    Hip abduction    Hip adduction    Hip internal rotation    Hip external rotation    Knee flexion    Knee extension  3+  Ankle dorsiflexion 5 5  Ankle plantarflexion    Ankle inversion    Ankle eversion     (Blank rows = not tested)    FUNCTIONAL TESTS:  30 seconds chair stand test:  hands on thigh 2 x  2 minute walk test: 207 ft with RW Single leg stance:  unable on either leg     TODAY'S TREATMENT:             03/20/22:             Sit to stands no UE standard chair 15X            Standing:             Wall slides x 5  2 sets   Forward step ups 6" 10X each  Vectors with 1 HHA 5X5" each LE            Tandem stance x 3 B             Side stepping 12 feet 2RT without UE assist with green theraband            Hip abduction and extension with theraband             Marching with one UE assist             Heel raise with functional squat x 15                       03/18/22 Sit to stands no UE standard chair 10X Standing: heelraises 20X  Forward step ups 4" 10X each  Lateral step ups 4" 10X each  Forward lunges onto 4" step 1 UE assist 10X each  Vectors with 1 HHA 5X5" each LE Ambulation without AD in hallway X 150 feet (cues for stab) Side stepping 12 feet 3RT without UE assist Stair negotiation 1 flight up, 3 flights down reciprocally with 1 HR  03/16/22                         Standing:                         Wall arch x 10                         Side stepping 2 RT  Functional squat x 10                         Step up 4" box x 10                         Lateral step up x 10                          Lt forward lunge x 10                          Single leg stance:  3 reps each leg max 4"                          Tandem stance x 5                          Seated:                           Sit to stand x 15                          Supine:                           Lt knee to chest x 5                          Bridge hold 5' x 10 2 sets                          Side lying:                         Clam x 10                         Hip abduction x 10   03/11/22 Ambulation 200 feet with walking stick Sit to stand from standard chair no UE 5X Standing intermittent HHA: Heelraises/Toeraises 20X each  Hip abduction 2X10 each  Hip extension 2X10 each  Tandem stance X30"each LE lead with max of 4" without UE assist Seated:  LAQ 20x3" holds Supine:  Bridge 20X  SLR 2X10  Heelslide Rt only 10X    Evaluation             Sitting :  RT LAQ x 10            Supine:  quad set  x 10                          Heel slide x 5                          SLR x 5   PATIENT EDUCATION:  Education details: HEP  Person educated: Patient Education method: Explanation and Handouts Education comprehension: verbalized understanding   HOME EXERCISE PROGRAM: 03/20/22 - Hip Abduction with Resistance Loop  - 1 x daily - 7 x weekly - 1 sets - 10 reps - 3-5" hold -  Standing Hip Extension Kicks  - 1 x daily - 7 x weekly - 1 sets - 10 reps - 3-5" hold - Side Stepping with Resistance at Thighs  - 1 x daily - 7 x weekly - 1 sets - 10 reps - 3-5" hold             03/16/22             Access Code: [email or web address] URL: [email or web address] Date: 03/16/2022 Prepared by: Rayetta Humphrey  Exercises - Heel Raise  - 2 x daily - 7 x weekly - 1 sets - 10 reps - 3-5" hold - Mini Squat with Counter Support  - 2 x daily - 7 x weekly - 1 sets - 10 reps - 3-5" hold - Sit to Stand  - 2 x daily - 7 x weekly - 1 sets - 10 reps - Supine Single Knee to Chest Stretch  - 2 x daily - 7 x weekly - 1 sets - 5 reps - 10" hold - Supine Bridge  - 2 x daily - 7 x weekly - 1 sets - 15 reps - 5" hold - Sidelying Hip Abduction  - 2 x daily - 7 x weekly - 1 sets - 10 reps - 3-5" hold Access [email or web address] URL: [email or web address] Date: 03/05/2022 Prepared by: Rayetta Humphrey  Exercises - Seated Knee Extension AROM  - 3  x daily - 7 x weekly - 1 sets - 10 reps - 3-5" hold - Supine Quadricep Sets  - 3 x daily - 7 x weekly - 1 sets - 10 reps - 3-5" hold - Supine Heel Slide  - 3 x daily - 7 x weekly - 1 sets - 10 reps - 3-5" hold - Small Range Straight Leg Raise  - 3 x daily - 7 x weekly - 1 sets - 10 reps - 3" hold  ASSESSMENT:  CLINICAL IMPRESSION: Pt continue to need breaks today due to muscle fatigue.  Therapist updated HEP. Treatment focused more on balance this session  Pt will continue to benefit from skilled PT to address deficits and maximize his functional ability.      OBJECTIVE IMPAIRMENTS Abnormal gait, decreased activity tolerance, decreased balance, decreased mobility, difficulty walking, decreased ROM, decreased strength, and pain.   ACTIVITY LIMITATIONS carrying, lifting, bending, sitting, standing, squatting, bed mobility, and locomotion level  PARTICIPATION LIMITATIONS: meal prep, cleaning, laundry, driving, shopping, community activity, and yard work  PERSONAL FACTORS 1-2 comorbidities: DM, OA with LT THR  are also affecting patient's functional outcome.   REHAB POTENTIAL: Good  CLINICAL DECISION MAKING: Stable/uncomplicated  EVALUATION COMPLEXITY: Low   GOALS: Goals reviewed with patient? Yes  SHORT TERM GOALS: Target date: 04/01/2022  Pt to be I in HEP to allow hip flexion to be to 90 degrees to be able to sit in comfort for an hour.  Baseline: Goal status: MET  2.  PT mm strength to increase by 1/2 grade to be able to come sit to stand from a chair without UE with ease.  Baseline:  Goal status: MET  3.  PT to be able to single leg stance on B0th LE for at least 5 seconds to decrease risk of falling  Baseline:  Goal status: IN PROGRESS  4.  Pt to be ambulating with a cane Baseline:  Goal status: MET   LONG TERM GOALS: Target date: 04/22/2022   PT to be I in advanced HEP to be able  to improve hip ROM to be able  to squat down and pick items off the floor  Baseline:  Goal status: IN PROGRESS  2.  Pt strength to be increased one grade to allow pt to go up and down 6 steps in a reciprocal manner with one hand hold  Baseline:  Goal status: MET  3.  Pt to be able to single leg stance for 10 seconds B for improved safety when walking on uneven terrain.  Baseline:  Goal status: IN PROGRESS  4.  PT to be ambulating without assistive device  Baseline:  Goal status: MET  5.  PT pain to be no greater than a 1/10 throughout the day  Baseline:  Goal status: IN PROGRESS  PLAN: PT FREQUENCY: 2x/week  PT DURATION: 6  weeks  PLANNED INTERVENTIONS: Therapeutic exercises, Therapeutic activity, Balance training, Gait training, Patient/Family education, Self Care, and Manual therapy  PLAN FOR NEXT SESSION: Continue with ROM, strength, and balance.  Increase functional stability.    Rayetta Humphrey, [place] [phone]  [phone]

## 2022-03-23 ENCOUNTER — Encounter (HOSPITAL_COMMUNITY): Payer: Medicaid Other | Admitting: Physical Therapy

## 2022-03-25 ENCOUNTER — Encounter (HOSPITAL_COMMUNITY): Payer: Medicaid Other | Admitting: Physical Therapy

## 2022-03-27 ENCOUNTER — Encounter (HOSPITAL_COMMUNITY): Payer: Medicaid Other | Admitting: Physical Therapy

## 2022-03-30 DIAGNOSIS — Z79899 Other long term (current) drug therapy: Secondary | ICD-10-CM | POA: Diagnosis not present

## 2022-04-07 DIAGNOSIS — G4733 Obstructive sleep apnea (adult) (pediatric): Secondary | ICD-10-CM | POA: Diagnosis not present

## 2022-04-08 ENCOUNTER — Encounter: Payer: Self-pay | Admitting: Internal Medicine

## 2022-04-08 DIAGNOSIS — G4700 Insomnia, unspecified: Secondary | ICD-10-CM

## 2022-04-08 MED ORDER — TRAZODONE HCL 50 MG PO TABS: 25.0000 mg | ORAL_TABLET | Freq: Every evening | ORAL | 0 refills | Status: DC | PRN

## 2022-04-13 ENCOUNTER — Other Ambulatory Visit: Payer: Medicaid Other | Admitting: Obstetrics and Gynecology

## 2022-04-13 NOTE — Patient Outreach (Signed)
Care Coordination  04/13/2022  [name] 1971-05-23 [id number]   Medicaid Managed Care   Unsuccessful Outreach Note  04/13/2022 Name: [name] MRN: [id number] DOB: 10/08/1970  Referred by: [name], MD Reason for referral : High Risk Managed Medicaid (Unsuccessful telephone outreach)  An unsuccessful telephone outreach was attempted today. The patient was referred to the case management team for assistance with care management and care coordination.   Follow Up Plan: The care management team will reach out to the patient again over the next 30 business  days.   [name] RN, BSN [place]  [facility] - Managed Medicaid High Risk [phone]

## 2022-04-13 NOTE — Patient Instructions (Signed)
Hi Mr. Bosher, sorry to have missed you today-I hope all is well  - as a part of your Medicaid benefit, you are eligible for care management and care coordination services at no cost or copay. I was unable to reach you by phone today but would be happy to help you with your health related needs. Please feel free to call me at [phone].   A member of the Managed Medicaid care management team will reach out to you again over the next 30 business days.   [name] RN, BSN [place]  [facility] - Managed Medicaid High Risk [phone]

## 2022-04-14 ENCOUNTER — Telehealth: Payer: Self-pay | Admitting: Internal Medicine

## 2022-04-14 ENCOUNTER — Encounter: Payer: Self-pay | Admitting: Internal Medicine

## 2022-04-14 DIAGNOSIS — G4700 Insomnia, unspecified: Secondary | ICD-10-CM

## 2022-04-14 MED ORDER — ESZOPICLONE 1 MG PO TABS: 1.0000 mg | ORAL_TABLET | Freq: Every evening | ORAL | 0 refills | Status: DC | PRN

## 2022-04-14 NOTE — Telephone Encounter (Signed)
Patient called in regard to traZODone (DESYREL) 50 MG tablet    Wants to switch medication patient is not liking how med makes him feel.  Wants to try " ESZOPICLONE"  Wants a call back

## 2022-04-14 NOTE — Telephone Encounter (Signed)
Lunesta 1 mg qhs prn x 30 tablets prescribed today. I spoke with Mr. Biermann and notified him that this medication is a controlled substance that must be taken as prescribed and not shared with others. I also informed him that this is not a long-term medication and is only intended for as needed use. He has follow up scheduled with me for late December.

## 2022-04-21 ENCOUNTER — Other Ambulatory Visit: Payer: Self-pay | Admitting: Internal Medicine

## 2022-04-21 DIAGNOSIS — E1121 Type 2 diabetes mellitus with diabetic nephropathy: Secondary | ICD-10-CM

## 2022-05-05 DIAGNOSIS — Z79899 Other long term (current) drug therapy: Secondary | ICD-10-CM | POA: Diagnosis not present

## 2022-05-05 DIAGNOSIS — M549 Dorsalgia, unspecified: Secondary | ICD-10-CM | POA: Diagnosis not present

## 2022-05-05 DIAGNOSIS — M5136 Other intervertebral disc degeneration, lumbar region: Secondary | ICD-10-CM | POA: Diagnosis not present

## 2022-05-05 DIAGNOSIS — G8929 Other chronic pain: Secondary | ICD-10-CM | POA: Diagnosis not present

## 2022-05-05 DIAGNOSIS — Z6838 Body mass index (BMI) 38.0-38.9, adult: Secondary | ICD-10-CM | POA: Diagnosis not present

## 2022-05-05 DIAGNOSIS — F32A Depression, unspecified: Secondary | ICD-10-CM | POA: Diagnosis not present

## 2022-05-05 DIAGNOSIS — Z0130 Encounter for examination of blood pressure without abnormal findings: Secondary | ICD-10-CM | POA: Diagnosis not present

## 2022-05-05 DIAGNOSIS — I10 Essential (primary) hypertension: Secondary | ICD-10-CM | POA: Diagnosis not present

## 2022-05-05 DIAGNOSIS — N1832 Chronic kidney disease, stage 3b: Secondary | ICD-10-CM | POA: Diagnosis not present

## 2022-05-05 DIAGNOSIS — M160 Bilateral primary osteoarthritis of hip: Secondary | ICD-10-CM | POA: Diagnosis not present

## 2022-05-05 DIAGNOSIS — R030 Elevated blood-pressure reading, without diagnosis of hypertension: Secondary | ICD-10-CM | POA: Diagnosis not present

## 2022-05-13 ENCOUNTER — Other Ambulatory Visit: Payer: Medicaid Other | Admitting: Obstetrics and Gynecology

## 2022-05-13 ENCOUNTER — Other Ambulatory Visit: Payer: Self-pay | Admitting: Internal Medicine

## 2022-05-13 DIAGNOSIS — E1121 Type 2 diabetes mellitus with diabetic nephropathy: Secondary | ICD-10-CM

## 2022-05-13 NOTE — Patient Outreach (Signed)
  Medicaid Managed Care   Unsuccessful Attempt Note   05/13/2022 Name: [name] MRN: [id number] DOB: [birth year]  Referred by: [name], MD Reason for referral : High Risk Managed Medicaid (Unsuccessful telephone outreach)   A second unsuccessful telephone outreach was attempted today. The patient was referred to the case management team for assistance with care management and care coordination.    Follow Up Plan: The Managed Medicaid care management team will reach out to the patient again over the next 30 business  days. and The  Patient has been provided with contact information for the Managed Medicaid care management team and has been advised to call with any health related questions or concerns.    [name] RN, BSN [place]  [facility] - Managed Medicaid High Risk [phone].

## 2022-05-13 NOTE — Patient Instructions (Signed)
Hi [name], sorry we were unable to reach you today-I hope all is well - as a part of your Medicaid benefit, you are eligible for care management and care coordination services at no cost or copay. I was unable to reach you by phone today but would be happy to help you with your health related needs. Please feel free to call me at [phone].  A member of the Managed Medicaid care management team will reach out to you again over the next 30 business  days.   [name] RN, BSN [place]  [facility] - Managed Medicaid High Risk [phone]

## 2022-05-20 ENCOUNTER — Encounter: Payer: Self-pay | Admitting: Internal Medicine

## 2022-05-20 ENCOUNTER — Ambulatory Visit: Payer: Medicaid Other | Admitting: Internal Medicine

## 2022-05-20 VITALS — BP 119/81 | HR 79 | Resp 16 | Ht 69.0 in | Wt 258.0 lb

## 2022-05-20 DIAGNOSIS — E1121 Type 2 diabetes mellitus with diabetic nephropathy: Secondary | ICD-10-CM

## 2022-05-20 DIAGNOSIS — G4733 Obstructive sleep apnea (adult) (pediatric): Secondary | ICD-10-CM | POA: Diagnosis not present

## 2022-05-20 DIAGNOSIS — E782 Mixed hyperlipidemia: Secondary | ICD-10-CM

## 2022-05-20 DIAGNOSIS — N1832 Chronic kidney disease, stage 3b: Secondary | ICD-10-CM

## 2022-05-20 DIAGNOSIS — E559 Vitamin D deficiency, unspecified: Secondary | ICD-10-CM | POA: Diagnosis not present

## 2022-05-20 DIAGNOSIS — I10 Essential (primary) hypertension: Secondary | ICD-10-CM | POA: Diagnosis not present

## 2022-05-20 DIAGNOSIS — M1611 Unilateral primary osteoarthritis, right hip: Secondary | ICD-10-CM

## 2022-05-20 DIAGNOSIS — Z1329 Encounter for screening for other suspected endocrine disorder: Secondary | ICD-10-CM

## 2022-05-20 DIAGNOSIS — Z8639 Personal history of other endocrine, nutritional and metabolic disease: Secondary | ICD-10-CM | POA: Diagnosis not present

## 2022-05-20 DIAGNOSIS — R748 Abnormal levels of other serum enzymes: Secondary | ICD-10-CM

## 2022-05-20 DIAGNOSIS — G4700 Insomnia, unspecified: Secondary | ICD-10-CM | POA: Diagnosis not present

## 2022-05-20 DIAGNOSIS — K219 Gastro-esophageal reflux disease without esophagitis: Secondary | ICD-10-CM | POA: Diagnosis not present

## 2022-05-20 NOTE — Progress Notes (Signed)
Established Patient Office Visit  Subjective   Patient ID: [name], male    DOB: 02-16-1971  Age: 51 y.o. MRN: [id number]  Chief Complaint  Patient presents with   Diabetes   Hypertension    3 month follow up    Mr. Tomasetti returns to care today.  He was last evaluated by me in person on 9/26 for his annual exam.  No medication changes were made and 36-monthfollow-up was arranged.  In the interim he has undergone THA of the right hip.  His postoperative course has been uncomplicated and he has completed physical therapy. Today Mr. CBogdenstates that he feels well.  He is asymptomatic and has no acute concerns to discuss.  Past Medical History:  Diagnosis Date   ALLERGY 08/03/2008   Qualifier: Diagnosis of  By: SChrist Kick    Anemia    Arthritis    "hips" (01/15/2015)   Blood transfusion without reported diagnosis    Cellulitis 07/25/2018   Chronic back pain greater than 3 months duration 02/11/2017   Chronic lower back pain    Colon polyps    COLONIC POLYPS, ADENOMATOUS 08/03/2008   Qualifier: Diagnosis of  By: SChrist Kick    CVA 08/03/2008   Qualifier: History of  By: SChrist Kick    Depression    denies   Diabetes mellitus    diet controlled- no meds since wt loss   Diabetes mellitus ([place] 07/10/2019   Dysrhythmia    s/p surgery bariatric- cardioversion   Erectile dysfunction 01/26/2018   GERD (gastroesophageal reflux disease)    HIATAL HERNIA 08/03/2008   Qualifier: Diagnosis of  By: SChrist Kick    Hip pain    History of gout    Hyperlipidemia    Hypertension    Joint pain    Narcotic dependence ([place] 02/11/2017   Neuromuscular disorder ([place]    PONV (postoperative nausea and vomiting)    Prediabetes 03/29/2017   Sleep apnea    no longer have to use cpap since wt loss   Stage 3 chronic kidney disease ([facility] 01/26/2018   Stroke ([place] 2006   denies residual on 01/15/2015   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 ([place]  08/01/2018   Past Surgical History:  Procedure Laterality Date   BARIATRIC SURGERY  2010   [place] 02/[place]20[place]   Procedure: BIOPSY;  Surgeon: CHarvel Quale MD;  [facility];  Service: Gastroenterology;;   CARDIOVERSION     COLONOSCOPY N/A 08/02/2014   Procedure: COLONOSCOPY;  Surgeon: NRogene Houston MD;  [facility];  Service: Endoscopy;  Laterality: N/A;  210 - moved to 3/10 @ 11:55 - Ann notified pt   COLONOSCOPY WITH PROPOFOL N/A 02/26/2021   Procedure: COLONOSCOPY WITH PROPOFOL;  Surgeon: CHarvel Quale MD;  [facility];  Service: Gastroenterology;  Laterality: N/A;  10:00   ESOPHAGOGASTRODUODENOSCOPY     ESOPHAGOGASTRODUODENOSCOPY (EGD) WITH PROPOFOL N/A 02/26/2021   Procedure: ESOPHAGOGASTRODUODENOSCOPY (EGD) WITH PROPOFOL;  Surgeon: CHarvel Quale MD;  [facility];  Service: Gastroenterology;  Laterality: N/A;   HERNIA REPAIR     LIPOSUCTION     PANNICULECTOMY  01/14/2015   PANNICULECTOMY N/A 01/14/2015   Procedure:  TOTAL PANNICULECTOMY WTH LIPOSUCTION OF SIDES;  Surgeon: GCristine Polio MD;  [place]  Service: Plastics;  Laterality: N/A;   POLYPECTOMY  02/26/2021   Procedure: POLYPECTOMY INTESTINAL;  Surgeon:  Harvel Quale, MD;  [facility];  Service: Gastroenterology;;   TONSILLECTOMY     TOTAL HIP ARTHROPLASTY Left 12/16/2021   Procedure: TOTAL HIP ARTHROPLASTY ANTERIOR APPROACH;  Surgeon: Renette Butters, MD;  [facility];  Service: Orthopedics;  Laterality: Left;   TOTAL HIP ARTHROPLASTY Right 03/03/2022   Procedure: TOTAL HIP ARTHROPLASTY ANTERIOR APPROACH;  Surgeon: Renette Butters, MD;  [facility];  Service: Orthopedics;  Laterality: Right;   VENTRAL HERNIA REPAIR  01/14/2015   VENTRAL HERNIA REPAIR N/A 01/14/2015   Procedure: HERNIA REPAIR VENTRAL ADULT AND MUSCLE REPAIR;  Surgeon: Cristine Polio, MD;  Location: [place];  Service:  Plastics;  Laterality: N/A;   Social History   Tobacco Use   Smoking status: Never   Smokeless tobacco: Never  Vaping Use   Vaping Use: Never used  Substance Use Topics   Alcohol use: No   Drug use: No   Family History  Problem Relation Age of Onset   COPD Mother    Depression Mother    Hyperlipidemia Mother    Hypertension Mother    Heart disease Mother    Thyroid disease Mother    Anxiety disorder Mother    Diabetes Father    Pulmonary embolism Father 17       blood clot   Hyperlipidemia Father    Sudden death Father    Obesity Father    Eating disorder Father    Hypertension Brother    No Known Allergies  Review of Systems  Constitutional:  Negative for chills and fever.  HENT:  Negative for sore throat.   Respiratory:  Negative for cough and shortness of breath.   Cardiovascular:  Negative for chest pain, palpitations and leg swelling.  Gastrointestinal:  Negative for abdominal pain, blood in stool, constipation, diarrhea, nausea and vomiting.  Genitourinary:  Negative for dysuria and hematuria.  Musculoskeletal:  Negative for myalgias.  Skin:  Negative for itching and rash.  Neurological:  Negative for dizziness and headaches.  Psychiatric/Behavioral:  Negative for depression and suicidal ideas.      Objective:     BP 119/81   Pulse 79   Resp 16   Ht '5\' 9"'$  (1.753 m)   Wt 258 lb (117 kg)   SpO2 98%   BMI 38.10 kg/m  BP Readings from Last 3 Encounters:  05/20/22 119/81  03/04/22 116/69  02/18/22 113/76   Physical Exam Vitals reviewed.  Constitutional:      General: He is not in acute distress.    Appearance: Normal appearance. He is obese. He is not ill-appearing.  HENT:     Head: Normocephalic and atraumatic.     Right Ear: External ear normal.     Left Ear: External ear normal.     Nose: Nose normal. No congestion or rhinorrhea.     Mouth/Throat:     Mouth: Mucous membranes are moist.     Pharynx: Oropharynx is clear.  Eyes:      General: No scleral icterus.    Extraocular Movements: Extraocular movements intact.     Conjunctiva/sclera: Conjunctivae normal.     Pupils: Pupils are equal, round, and reactive to light.  Cardiovascular:     Rate and Rhythm: Normal rate and regular rhythm.     Pulses: Normal pulses.     Heart sounds: Normal heart sounds. No murmur heard. Pulmonary:     Effort: Pulmonary effort is normal.     Breath sounds: Normal breath sounds. No  wheezing, rhonchi or rales.  Abdominal:     General: Abdomen is flat. Bowel sounds are normal. There is no distension.     Palpations: Abdomen is soft.     Tenderness: There is no abdominal tenderness.  Musculoskeletal:        General: No swelling or deformity. Normal range of motion.     Cervical back: Normal range of motion.  Skin:    General: Skin is warm and dry.     Capillary Refill: Capillary refill takes less than 2 seconds.  Neurological:     General: No focal deficit present.     Mental Status: He is alert and oriented to person, place, and time.     Motor: No weakness.  Psychiatric:        Mood and Affect: Mood normal.        Behavior: Behavior normal.        Thought Content: Thought content normal.    Last CBC Lab Results  Component Value Date   WBC 8.8 02/18/2022   HGB 14.8 02/18/2022   HCT 44.7 02/18/2022   MCV 94.7 02/18/2022   MCH 31.4 02/18/2022   RDW 13.4 02/18/2022   PLT 214 05/27/7251   Last metabolic panel Lab Results  Component Value Date   GLUCOSE 116 (H) 05/20/2022   NA 140 05/20/2022   K 4.1 05/20/2022   CL 101 05/20/2022   CO2 23 05/20/2022   BUN 21 05/20/2022   CREATININE 1.82 (H) 05/20/2022   GFRNONAA 58 (L) 02/18/2022   CALCIUM 9.7 05/20/2022   PROT 6.8 05/20/2022   ALBUMIN 4.1 05/20/2022   LABGLOB 2.7 05/20/2022   AGRATIO 1.5 05/20/2022   BILITOT 0.6 05/20/2022   ALKPHOS 124 (H) 05/20/2022   AST 26 05/20/2022   ALT 19 05/20/2022   ANIONGAP 8 02/18/2022   Last lipids Lab Results  Component  Value Date   CHOL 133 10/17/2021   HDL 42 10/17/2021   LDLCALC 62 10/17/2021   TRIG 174 (H) 10/17/2021   CHOLHDL 3.2 10/17/2021   Last hemoglobin A1c Lab Results  Component Value Date   HGBA1C 5.7 (H) 02/18/2022   Last thyroid functions Lab Results  Component Value Date   TSH 1.81 04/13/2019   T3TOTAL 86 02/15/2019   Last vitamin D Lab Results  Component Value Date   VD25OH 57.0 05/20/2022   Last vitamin B12 and Folate Lab Results  Component Value Date   VITAMINB12 815 04/13/2019   The 10-year ASCVD risk score (Arnett DK, et al., 2019) is: 5.8%    Assessment & Plan:   Problem List Items Addressed This Visit       Hypertension    Previously documented history of hypertension.  His blood pressure today is 119/81.  He is not currently on any antihypertensive medications.  He endorses a prior history of adverse reactions to both ACEi / ARB.  No indication for changes today.      OSA     Previously documented history of OSA.  Not currently on CPAP due to intolerance.  States that he sleeps with HOB elevated at night that reportedly relieves his symptoms.      Gastroesophageal reflux disease    Symptoms controlled with as needed use of Tums      Type 2 diabetes mellitus with diabetic nephropathy (HCC)    Last A1c was 5.7 in September.  He is currently prescribed Ozempic and [place].  Not currently on metformin due to CKD. -No medication changes today -  Not on ARB/ACE due to adverse reaction -Continue statin therapy -Diabetes related preventative care items are up-to-date      Unilateral primary osteoarthritis, right hip    S/p right THA on 03/03/22.  His postoperative course has been unremarkable.  Pain has significantly improved.      Kidney disease, chronic, stage III (GFR 30-59 ml/min) (HCC)    Followed by neurology (Dr. Theador Hawthorne).  Renal function remains stable on latest labs.      Hyperlipidemia    Currently prescribed atorvastatin 40 mg daily.  Lipid  panel updated in May.  Total cholesterol and LDL are at goal.      Vitamin D deficiency    Previously documented.  Vitamin D deficiency.  Most recent vitamin D level per our records is from November 2020.  Repeat vitamin D level ordered today.      Insomnia    Started on Lunesta last month by me, which seems to be effective.  No changes today.      Elevated alkaline phosphatase level    Followed by pain management at [facility].  A right upper quadrant ultrasound was ordered by his provider due to an elevated alkaline phosphatase level. ALP has not been elevated on recent hepatic function panels in our records, however his lab report from [facility] does show an elevated ALP level. -CMP and GGT have been ordered today.  If ALT/GGT are elevated, I will recommend he proceed with previously ordered RUQ U/S.      Return in about 6 months (around 11/19/2022).   [name], MD

## 2022-05-20 NOTE — Patient Instructions (Signed)
It was a pleasure to see you today.  Thank you for giving [name] the opportunity to be involved in your care.  Below is a brief recap of your visit and next steps.  We will plan to see you again in 6 months.  Summary No medication changes today We will check labs Plan for follow up in 6 months

## 2022-05-21 LAB — CMP14+EGFR
ALT: 19 IU/L (ref 0–44)
AST: 26 IU/L (ref 0–40)
Albumin/Globulin Ratio: 1.5 (ref 1.2–2.2)
Albumin: 4.1 g/dL (ref 3.8–4.9)
Alkaline Phosphatase: 124 IU/L — ABNORMAL HIGH (ref 44–121)
BUN/Creatinine Ratio: 12 (ref 9–20)
BUN: 21 mg/dL (ref 6–24)
Bilirubin Total: 0.6 mg/dL (ref 0.0–1.2)
CO2: 23 mmol/L (ref 20–29)
Calcium: 9.7 mg/dL (ref 8.7–10.2)
Chloride: 101 mmol/L (ref 96–106)
Creatinine, Ser: 1.82 mg/dL — ABNORMAL HIGH (ref 0.76–1.27)
Globulin, Total: 2.7 g/dL (ref 1.5–4.5)
Glucose: 116 mg/dL — ABNORMAL HIGH (ref 70–99)
Potassium: 4.1 mmol/L (ref 3.5–5.2)
Sodium: 140 mmol/L (ref 134–144)
Total Protein: 6.8 g/dL (ref 6.0–8.5)
eGFR: 44 mL/min/{1.73_m2} — ABNORMAL LOW (ref >59)

## 2022-05-21 LAB — GAMMA GT: GGT: 96 IU/L — ABNORMAL HIGH (ref 0–65)

## 2022-05-21 LAB — VITAMIN D 25 HYDROXY (VIT D DEFICIENCY, FRACTURES): Vit D, 25-Hydroxy: 57 ng/mL (ref 30.0–100.0)

## 2022-05-22 ENCOUNTER — Telehealth: Payer: Self-pay

## 2022-05-22 NOTE — Telephone Encounter (Signed)
..   Medicaid Managed Care Note  05/22/2022 Name: [name] MRN: [id number] DOB: [birth year]  [name] is a 51 y.o. year old male who is a primary care patient of [name], MD and is actively engaged with the care management team. I reached out to [name] by phone today to assist with re-scheduling a follow up visit with the RN Case Manager  Follow up plan: Unsuccessful telephone outreach attempt made. A HIPAA compliant phone message was left for the patient providing contact information and requesting a return call.  The care management team will reach out to the patient again over the next 7 days.   [place]

## 2022-05-26 ENCOUNTER — Telehealth: Payer: Self-pay

## 2022-05-26 DIAGNOSIS — G4700 Insomnia, unspecified: Secondary | ICD-10-CM | POA: Insufficient documentation

## 2022-05-26 DIAGNOSIS — R748 Abnormal levels of other serum enzymes: Secondary | ICD-10-CM | POA: Insufficient documentation

## 2022-05-26 NOTE — Assessment & Plan Note (Signed)
Followed by pain management at [facility].  A right upper quadrant ultrasound was ordered by his provider due to an elevated alkaline phosphatase level. ALP has not been elevated on recent hepatic function panels in our records, however his lab report from [facility] does show an elevated ALP level. -CMP and GGT have been ordered today.  If ALT/GGT are elevated, I will recommend he proceed with previously ordered RUQ U/S.

## 2022-05-26 NOTE — Assessment & Plan Note (Signed)
Last A1c was 5.7 in September.  He is currently prescribed Ozempic and [place].  Not currently on metformin due to CKD. -No medication changes today -Not on ARB/ACE due to adverse reaction -Continue statin therapy -Diabetes related preventative care items are up-to-date

## 2022-05-26 NOTE — Assessment & Plan Note (Signed)
Started on Lunesta last month by me, which seems to be effective.  No changes today.

## 2022-05-26 NOTE — Assessment & Plan Note (Signed)
S/p right THA on 03/03/22.  His postoperative course has been unremarkable.  Pain has significantly improved.

## 2022-05-26 NOTE — Assessment & Plan Note (Addendum)
Followed by neurology (Dr. Theador Hawthorne).  Renal function remains stable on latest labs.

## 2022-05-26 NOTE — Assessment & Plan Note (Signed)
Previously documented.  Vitamin D deficiency.  Most recent vitamin D level per our records is from November 2020.  Repeat vitamin D level ordered today.

## 2022-05-26 NOTE — Telephone Encounter (Signed)
I returned the patient's phone call today but I did get his voicemail again. I left my name and number again and will reach out again tomorrow.  [place]

## 2022-05-26 NOTE — Assessment & Plan Note (Signed)
Previously documented history of OSA.  Not currently on CPAP due to intolerance.  States that he sleeps with HOB elevated at night that reportedly relieves his symptoms.

## 2022-05-26 NOTE — Assessment & Plan Note (Signed)
Previously documented history of hypertension.  His blood pressure today is 119/81.  He is not currently on any antihypertensive medications.  He endorses a prior history of adverse reactions to both ACEi / ARB.  No indication for changes today.

## 2022-05-26 NOTE — Assessment & Plan Note (Signed)
Currently prescribed atorvastatin 40 mg daily.  Lipid panel updated in May.  Total cholesterol and LDL are at goal.

## 2022-05-26 NOTE — Assessment & Plan Note (Signed)
Symptoms controlled with as needed use of Tums

## 2022-05-29 ENCOUNTER — Telehealth: Payer: Self-pay

## 2022-05-29 NOTE — Telephone Encounter (Signed)
The patient returned my call while I was documenting my call to him. Patient is rescheduled with the MM RNCM on 06/02/22.   [name]

## 2022-06-02 ENCOUNTER — Other Ambulatory Visit: Payer: Medicaid Other | Admitting: Obstetrics and Gynecology

## 2022-06-02 NOTE — Patient Instructions (Signed)
Hi Mr. Clymer to have missed you today- as a part of your Medicaid benefit, you are eligible for care management and care coordination services at no cost or copay. I was unable to reach you by phone today but would be happy to help you with your health related needs. Please feel free to call me at [phone]  A member of the Managed Medicaid care management team will reach out to you again over the next 30 business  days.   [name] RN, BSN [place]  [facility] - Managed Medicaid High Risk [phone]

## 2022-06-02 NOTE — Patient Outreach (Signed)
  Medicaid Managed Care   Unsuccessful Attempt Note   06/02/2022 Name: [name] MRN: [id number] DOB: Feb 16, 1971  Referred by: [name], MD Reason for referral : High Risk Managed Medicaid (Unsuccessful telephone outreach)   An unsuccessful telephone outreach was attempted today. The patient was referred to the case management team for assistance with care management and care coordination.    Follow Up Plan: The Managed Medicaid care management team will reach out to the patient again over the next 30 business  days. and The  Patient has been provided with contact information for the Managed Medicaid care management team and has been advised to call with any health related questions or concerns.    [name] RN, BSN [place]  [facility] - Managed Medicaid High Risk [phone]

## 2022-06-05 DIAGNOSIS — F32A Depression, unspecified: Secondary | ICD-10-CM | POA: Diagnosis not present

## 2022-06-05 DIAGNOSIS — N1832 Chronic kidney disease, stage 3b: Secondary | ICD-10-CM | POA: Diagnosis not present

## 2022-06-05 DIAGNOSIS — Z0130 Encounter for examination of blood pressure without abnormal findings: Secondary | ICD-10-CM | POA: Diagnosis not present

## 2022-06-05 DIAGNOSIS — Z6838 Body mass index (BMI) 38.0-38.9, adult: Secondary | ICD-10-CM | POA: Diagnosis not present

## 2022-06-05 DIAGNOSIS — Z79899 Other long term (current) drug therapy: Secondary | ICD-10-CM | POA: Diagnosis not present

## 2022-06-05 DIAGNOSIS — M549 Dorsalgia, unspecified: Secondary | ICD-10-CM | POA: Diagnosis not present

## 2022-06-05 DIAGNOSIS — I10 Essential (primary) hypertension: Secondary | ICD-10-CM | POA: Diagnosis not present

## 2022-06-05 DIAGNOSIS — M62830 Muscle spasm of back: Secondary | ICD-10-CM | POA: Diagnosis not present

## 2022-06-05 DIAGNOSIS — M160 Bilateral primary osteoarthritis of hip: Secondary | ICD-10-CM | POA: Diagnosis not present

## 2022-06-05 DIAGNOSIS — M5136 Other intervertebral disc degeneration, lumbar region: Secondary | ICD-10-CM | POA: Diagnosis not present

## 2022-06-08 ENCOUNTER — Telehealth: Payer: Self-pay

## 2022-06-08 NOTE — Telephone Encounter (Signed)
..  Medicaid Managed Care Note  06/08/2022 Name: [name] MRN: [id number] DOB: [birth year]  [name] is a 52 y.o. year old male who is a primary care patient of [name], MD and is actively engaged with the care management team. I reached out to [name] by phone today to assist with re-scheduling a follow up visit with the RN Case Manager  Follow up plan: Unsuccessful telephone outreach attempt made. A HIPAA compliant phone message was left for the patient providing contact information and requesting a return call.  The care management team will reach out to the patient again over the next 7 days.     [place]

## 2022-06-10 ENCOUNTER — Other Ambulatory Visit: Payer: Self-pay | Admitting: Internal Medicine

## 2022-06-10 DIAGNOSIS — E1121 Type 2 diabetes mellitus with diabetic nephropathy: Secondary | ICD-10-CM

## 2022-06-10 DIAGNOSIS — G4700 Insomnia, unspecified: Secondary | ICD-10-CM

## 2022-06-11 ENCOUNTER — Other Ambulatory Visit: Payer: Self-pay | Admitting: Internal Medicine

## 2022-06-11 DIAGNOSIS — G4700 Insomnia, unspecified: Secondary | ICD-10-CM

## 2022-06-18 ENCOUNTER — Encounter: Payer: Self-pay | Admitting: Obstetrics and Gynecology

## 2022-06-18 ENCOUNTER — Other Ambulatory Visit: Payer: Medicaid Other | Admitting: Obstetrics and Gynecology

## 2022-06-18 NOTE — Patient Instructions (Signed)
Visit Information  [name] was given information about Medicaid Managed Care team care coordination services as a part of their Healthy [facility] Medicaid benefit. [name] verbally consented to engagement with the [facility] Managed Care team.   If you are experiencing a medical emergency, please call 911 or report to your local emergency department or urgent care.   If you have a non-emergency medical problem during routine business hours, please contact your provider's office and ask to speak with a nurse.   For questions related to your Healthy [facility] health plan, please call: [phone] or visit the homepage here: [email or web address]  If you would like to schedule transportation through your Healthy [facility] plan, please call the following number at least 2 days in advance of your appointment: [phone]  For information about your ride after you set it up, call Ride Assist at [phone]. Use this number to activate a Will Call pickup, or if your transportation is late for a scheduled pickup. Use this number, too, if you need to make a change or cancel a previously scheduled reservation.  If you need transportation services right away, call [phone]. The after-hours call center is staffed 24 hours to handle ride assistance and urgent reservation requests (including discharges) 365 days a year. Urgent trips include sick visits, hospital discharge requests and life-sustaining treatment.  Call the [place] at [phone], at any time, 24 hours a day, 7 days a week. If you are in danger or need immediate medical attention call 911.  If you would like help to quit smoking, call 1-800-QUIT-NOW 458-695-8060) OR Espaol: 1-855-Djelo-Ya (3-267-124-5809) o para ms informacin haga clic aqu or Text READY to 200-400 to register via text  Mr. Goetzke - following are the goals we discussed in your visit today:   Goals  Addressed    Timeframe:  Long-Range Goal Priority:  High Start Date:     10/21/21                        Expected End Date:  ongoing                     Follow Up Date: 07/16/22   - schedule appointment for vaccines needed due to my age or health - schedule recommended health tests (blood work, mammogram, colonoscopy, pap test) - schedule and keep appointment for annual check-up    Why is this important?   Screening tests can find diseases early when they are easier to treat.  Your doctor or nurse will talk with you about which tests are important for you.  Getting shots for common diseases like the flu and shingles will help prevent them.  06/17/22:  Appointment with Nephrology and Pain Management in February  Patient verbalizes understanding of instructions and care plan provided today and agrees to view in [place]. Active MyChart status and patient understanding of how to access instructions and care plan via MyChart confirmed with patient.     The Managed Medicaid care management team will reach out to the patient again over the next 30 business  days.  The  Patient has been provided with contact information for the Managed Medicaid care management team and has been advised to call with any health related questions or concerns.   [name] RN, BSN [place] Management Coordinator - Managed Medicaid High Risk [phone]   Following is a copy of your plan of care:  Care Plan : RN Care Manager Plan Of Care  Updates made by Gayla Medicus, RN since 06/18/2022 12:00 AM     Problem: Chronic Disease Management and Care Coordination Needs for DM, HTN, HLD, CKD      Long-Range Goal: Development of Plan Of Care For Chronic Disease Management and Care Coordination Needs to Assist With Meeting Treatment Goals for DM, CKD, HTN, obesity, chronic hip pain   Start Date: 01/07/2021  Expected End Date: 09/17/2022  Priority: High  Note:   Current Barriers:   Knowledge Deficits related to plan of care for management of HTN, HLD, CKD, and DMII, chronic pain, obesity Chronic Disease Management support and education needs related to HTN, HLD, and DMII, chronic pain 06/17/22:  patient states he is doing well today-no complaints-has recuperated fully from hip replacement surgery and attending gym three times a week.  BP and BG WNL.  Has appointments in February with Nephrology and Pain Management-patient states pain is managed-is a "2" right now on scale of 1-10.  Has stopped using CPAP as did not like-states he is sleeping okay with help of medication prescribed by provider.  RNCM Clinical Goal(s):  Patient will verbalize understanding of plan for management of HTN, HLD, CKD and DMII take all medications exactly as prescribed and will call provider for medication related questions attend all scheduled medical appointments:  demonstrate ongoing adherence to prescribed treatment plan for HTN, HLD, CKD and DMII as evidenced by daily/ twice daily monitoring and recording of CBG,  adherence to ADA/ carb modified, fat modified, no added salt diet, continue 30-40 minutes of exercise on your NuStep 5-7 days/week, adherence to prescribed medication regimen including new diabetes medication,  contacting provider for new or worsened symptoms or questions related to HTN, DM or HLD, CKD continue to work with Consulting civil engineer and managed Medicaid pharmacist to address care management and care coordination needs related to HTN, HLD, and DMII , CKD  Interventions: Inter-disciplinary care team collaboration (see longitudinal plan of care) Evaluation of current treatment plan related to  self management and patient's adherence to plan as established by provider Reviewed appointments and insured patient has transportation to all appointments   Chronic Kidney Disease (Status: Goal on Track (progressing): YES.) - all recent provider BP readings are meeting treatment targets,  patient reports his nephrologist was pleased that his creatinine has stabilized and that his BP reading during office visit was good ,  patient states he checks his BP daily  and that readings are usually <130/<80 Last practice recorded BP readings:   03/13/22-BP=118/70 BP Readings from Last 3 Encounters:  08/01/21 107/76  06/26/21 110/76  06/19/21 116/76   06/18/22         115-130/60-70      Most recent eGFR/CrCl:  Lab Results  Component Value Date   NA 137 05/15/2021   K 4.1 05/15/2021   CREATININE 1.63 (H) 05/15/2021   EGFR 51 (L) 05/15/2021   GFRNONAA 37 (L) 01/10/2020   GLUCOSE 128 (H) 05/15/2021    Reviewed medications with patient and discussed importance of compliance   Assessed frequency of self monitored BP, reviewed home and provider office readings, reviewed treatment targets and reviewed lifestyle strategies   Reviewed addition of Farxiga to medication regimen for blood pressure, blood sugar and weight control in addition to slowing progression of CKD and lowering risks of heart attack, stroke and cardiac death , assessed tolerance of Farxiga and reviewed major adverse side effects   Reviewed scheduled/upcoming provider  appointments  Discussed plans with patient for ongoing care management follow up and provided patient with direct contact information for care management team    Diabetes:  (Status: Goal on track: YES.)- patient reports self monitored fasting CBGs are usually <130, checks 1-2 times a day.  Most recent A1C=6.1 on 10/17/21. Lab Results  Component Value Date   HGBA1C 7.0 (H) 05/15/2021       HGBA1C                                                      5.7                                     02/18/22 Assessed frequency of CBG testing and reviewed fasting target Reviewed medications, including recent addition of Farxiga to medication regimen with patient and discussed importance of good medication taking behavior Discussed plans with patient for ongoing care  management follow up and provided patient with direct contact information for care management team;               Health Maintenance (Status: Condition stable. Not addressed this visit.) - patient completed colonoscopy and endoscopy on 02/26/21 and was aware of pathology report- non cancerous Patient interviewed about adult health maintenance status   Pneumonia Vaccine-  received pneumococcal  vaccine on 05/15/21 Influenza Vaccine- states he received flu shot on 02/06/21 COVID vaccination   - pt states he never received  Shingles Vaccination- received first dose on 02/06/21, did not receive 2nd vaccination so will message provider prior to his appointment on May 11,2023-second shingles vaccine received 10/17/21 Regular eye checkups- says he sees his eye doctor, Dr [name],  every 6 months due to glaucoma  Advanced Directives- states he does not have and does not want information at this time  Dental Care- states his dentist is Dr Mina Marble in [place] and he sees her once-twice yearly   Hyperlipidemia:  (Status: Condition stable. Not addressed this visit. Lab Results  Component Value Date   CHOL 162 05/15/2021   HDL 48 05/15/2021   LDLCALC 88 05/15/2021   TRIG 147 05/15/2021   CHOLHDL 6.1 (H) 12/20/2019     Medication review performed; medication list updated in electronic medical record.   Hypertension: (Status: Goal on Track (progressing): YES.)-patient states he checks his BP daily and that readings are usually <130/<80.  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 Last practice recorded BP readings:  BP Readings from Last 3 Encounters:  08/01/21 107/76  06/26/21 110/76  06/19/21 116/76  06/17/22          115-130/60-70    Most recent eGFR/CrCl:  Lab Results  Component Value Date   NA 137 05/15/2021   K 4.1 05/15/2021   CREATININE 1.63 (H) 05/15/2021   EGFR 51 (L) 05/15/2021   GFRNONAA 37 (L) 01/10/2020   GLUCOSE 128 (H) 05/15/2021    Evaluation of current treatment plan related to hypertension self management and patient's adherence to plan as established by provider;   Reviewed BP medications and assessed medication taking behavior ensuring he is taking Losartan that was recently added to his regimen Assessed frequency of self monitored blood press and readings, reviewed treatment targets Discussed plans  with patient for ongoing care management follow up and provided patient with direct contact information for care management team; Reviewed scheduled/upcoming provider appointments  Pain:  (Status: Goal on track: Yes. Medications reviewed Reviewed provider established plan for pain management; Counseled on the importance of reporting any/all new or changed pain symptoms or management strategies to pain management provider;  Weight Loss Interventions:  (Status:  New goal.) Long Term Goal Provided verbal and/or written education to patient re: provider recommended life style modifications  Reviewed recommended dietary changes: avoid fad diets, make small/incremental dietary and exercise changes, eat at the table and avoid eating in front of the TV, plan management of cravings, monitor snacking and cravings in food diary Congratulated patient on good medication taking behavior, ongoing weight loss  and going to gym three times a week for exercise Reviewed [facility] benefit related to assisting with weight loss Encouraged patient to reschedule with [name] for medical nutrition therapy  Patient Goals/Self-Care Activities: Take medications as prescribed   Attend all scheduled provider appointments Call pharmacy for medication refills 3-7 days in advance of running out of medications Perform all self care activities independently  Perform IADL's (shopping, preparing meals, housekeeping, managing finances) independently Call provider office for new concerns or questions  Call and schedule dentist appointment with Dr.  Mina Marble for check up and cleaning as soon as possible , phone number [phone] Reschedule a follow up appointment with [name], dietician and [facility], to help with ongoing weight loss

## 2022-06-18 NOTE — Patient Outreach (Signed)
Medicaid Managed Care   Nurse Care Manager Note  06/18/2022 Name:  LABIB CWYNAR MRN:  [id number] DOB:  Jun 13, 1970  [name] is an 52 y.o. year old male who is a primary patient of [name], MD.  The [facility] Managed Care Coordination team was consulted for assistance with:    Chronic healthcare management needs,   DM, HTN, chronic pain, CKD, ostoarthritis, HLD, OSA, GERD  Mr. Tandy was given information about Medicaid Managed Care Coordination team services today. [name] Patient agreed to services and verbal consent obtained.  Engaged with patient by telephone for follow up visit in response to provider referral for case management and/or care coordination services.   Assessments/Interventions:  Review of past medical history, allergies, medications, health status, including review of consultants reports, laboratory and other test data, was performed as part of comprehensive evaluation and provision of chronic care management services.  SDOH (Social Determinants of Health) assessments and interventions performed: SDOH Interventions    Flowsheet Row Patient Outreach Telephone from 06/18/2022 in [place] Patient Outreach Telephone from 03/13/2022 in [place] Coordination Patient Outreach Telephone from 02/09/2022 in [place] Patient Outreach Telephone from 12/30/2021 in [place] Patient Outreach Telephone from 11/21/2021 in [place] Patient Outreach Telephone from 10/21/2021 in [place] Coordination  SDOH Interventions        Food Insecurity Interventions -- -- Intervention Not Indicated -- Intervention Not Indicated --  Utilities Interventions -- -- Intervention Not Indicated -- -- --  Alcohol Usage Interventions -- Intervention Not Indicated (Score <7)  -- -- -- --  Financial Strain Interventions -- -- -- Intervention Not Indicated -- Intervention Not Indicated  Physical Activity Interventions Intervention Not Indicated  [goes to gym 3 times a week] -- -- -- -- --  Stress Interventions Intervention Not Indicated -- -- -- -- --     Care Plan  No Known Allergies  Medications Reviewed Today     Reviewed by Gayla Medicus, RN (Registered Nurse) on 06/18/22 at [place] List Status: <None>   Medication Order Taking? Sig Documenting Provider Last Dose Status Informant  acetaminophen (TYLENOL) 500 MG tablet [id number] No Take 2 tablets (1,000 mg total) by mouth every 6 (six) hours as needed for mild pain or moderate pain. Britt Bottom, PA-C Taking Active Self  atorvastatin (LIPITOR) 40 MG tablet [id number] No Take 1 tablet (40 mg total) by mouth daily. [name], MD Taking Active   Azelastine HCl (ASTEPRO NA) [id number] No Place 1 spray into the nose daily as needed (allergies). [provider] Taking Active Self  blood glucose meter kit and supplies KIT [id number] No Test daily Dx  r73.03 [name], DO Taking Active Self  Blood Pressure Monitor KIT [id number] No 1 each by Does not apply route daily. [name], NP Taking Active Self  eszopiclone (LUNESTA) 1 MG TABS tablet [id number]  TAKE 1 TABLET BY MOUTH AT BEDTIME AS NEEDED FOR SLEEP. TAKE IMMEDIATELY BEFORE BEDTIME. [name], MD  Active   FARXIGA 5 MG TABS tablet [id number] No Take 1 tablet (5 mg total) by mouth every morning. [name], MD Taking Active   ferrous sulfate 325 (65 FE) MG tablet [id number] No Take 325 mg by mouth every other day. [provider] Taking Active Self  gabapentin (NEURONTIN) 300 MG capsule [id number] No TAKE (  1) CAPSULE BY MOUTH AT BEDTIME. [name], MD Taking Active   glucose blood test strip [id number] No Use as instructed. Can check up to 4 times daily. [name], NP Taking Active Self  KERENDIA 10 MG TABS  [id number] No Take 10 mg by mouth daily. [provider] Taking Active Self  latanoprost (XALATAN) 0.005 % ophthalmic solution [id number] No Place 1 drop into both eyes at bedtime. [provider] Taking Active Self  loratadine (CLARITIN) 10 MG tablet [id number] No Take 10 mg by mouth daily. [provider] Taking Active Self  Melatonin 10 MG CAPS [id number] No Take 10 mg by mouth at bedtime. [provider] Taking Active Self  methocarbamol (ROBAXIN-750) 750 MG tablet [id number] No Take 1 tablet (750 mg total) by mouth every 8 (eight) hours as needed for muscle spasms (DO NOT TAKE WITH OTHER MUSCLE RELAXERS!). [name] M, PA-C Taking Active   Multiple Vitamin (MULTIVITAMIN) tablet [id number] No Take 1 tablet by mouth daily. One a day [provider] Taking Active Self  Oxycodone HCl 20 MG TABS [id number] No Take 20 mg by mouth 4 (four) times daily as needed (pain). [provider] Taking Active Self           Med Note [name]   Thu Nov 27, 2021 12:05 PM)    OZEMPIC, 2 MG/DOSE, 8 MG/3ML SOPN [id number]  INJECT 2 MG INTO THE SKIN ONCE WEEKLY. [name], MD  Active   sildenafil (VIAGRA) 50 MG tablet [id number] No Take 1 tablet (50 mg total) by mouth daily as needed for erectile dysfunction. [name], FNP Taking Active Self  timolol (BETIMOL) 0.5 % ophthalmic solution [id number] No Place 1 drop into both eyes in the morning. [provider] Taking Active Self           Patient Active Problem List   Diagnosis Date Noted   Insomnia 05/26/2022   Elevated alkaline phosphatase level 05/26/2022   S/P total right hip arthroplasty 03/03/2022   Annual visit for general adult medical examination with abnormal findings 02/17/2022   S/P total left hip arthroplasty 12/16/2021   Need for varicella vaccine 10/16/2021   OSA  09/11/2021   Pre-operative clearance 06/19/2021   Right bundle branch block (RBBB) with left  anterior fascicular block (LAFB) 06/19/2021   Immunization due 02/06/2021   Iron deficiency anemia 12/30/2020   History of colonic polyps 12/30/2020   Unilateral primary osteoarthritis, right hip 08/13/2020   Unilateral primary osteoarthritis, left hip 08/13/2020   Muscle spasms of both lower extremities 05/23/2020   Need for immunization against influenza 03/06/2020   Primary osteoarthritis of both hips 07/10/2019   Bilateral primary osteoarthritis of hip 07/10/2019   Vitamin D deficiency 04/13/2019   Morbid obesity with body mass index (BMI) of 40.0 to 44.9 in adult ([place]) 04/13/2019   Depressive disorder 04/13/2019   Body mass index (BMI) 45.0-49.9, adult ([place]) 04/13/2019   Kidney disease, chronic, stage III (GFR 30-59 ml/min) ([place]) 07/20/2017   History of cerebrovascular accident 07/20/2017   Gastroesophageal reflux disease 07/20/2017   Gastric bypass status for obesity 02/11/2017   Benzodiazepine dependence ([place]) 02/11/2017   Type 2 diabetes mellitus with diabetic nephropathy ([place]) 08/03/2008   Hyperlipidemia 08/03/2008   Hypertension 08/03/2008   SLEEP APNEA 08/03/2008   Sleep apnea 08/03/2008   Conditions to be addressed/monitored per PCP order:  Chronic healthcare management needs, DM, HTN, chronic pain, CKD, osteoarthritis, HLD, CKD, GERD  Care Plan : RN  Care Manager Plan Of Care  Updates made by Gayla Medicus, RN since 06/18/2022 12:00 AM     Problem: Chronic Disease Management and Care Coordination Needs for DM, HTN, HLD, CKD      Long-Range Goal: Development of Plan Of Care For Chronic Disease Management and Care Coordination Needs to Assist With Meeting Treatment Goals for DM, CKD, HTN, obesity, chronic hip pain   Start Date: 01/07/2021  Expected End Date: 09/17/2022  Priority: High  Note:   Current Barriers:  Knowledge Deficits related to plan of care for management of HTN, HLD, CKD, and DMII, chronic pain, obesity Chronic Disease Management support and education  needs related to HTN, HLD, and DMII, chronic pain 06/17/22:  patient states he is doing well today-no complaints-has recuperated fully from hip replacement surgery and attending gym three times a week.  BP and BG WNL.  Has appointments in February with Nephrology and Pain Management-patient states pain is managed-is a "2" right now on scale of 1-10.  Has stopped using CPAP as did not like-states he is sleeping okay with help of medication prescribed by provider.  RNCM Clinical Goal(s):  Patient will verbalize understanding of plan for management of HTN, HLD, CKD and DMII take all medications exactly as prescribed and will call provider for medication related questions attend all scheduled medical appointments:  demonstrate ongoing adherence to prescribed treatment plan for HTN, HLD, CKD and DMII as evidenced by daily/ twice daily monitoring and recording of CBG,  adherence to ADA/ carb modified, fat modified, no added salt diet, continue 30-40 minutes of exercise on your NuStep 5-7 days/week, adherence to prescribed medication regimen including new diabetes medication,  contacting provider for new or worsened symptoms or questions related to HTN, DM or HLD, CKD continue to work with Consulting civil engineer and managed Medicaid pharmacist to address care management and care coordination needs related to HTN, HLD, and DMII , CKD  Interventions: Inter-disciplinary care team collaboration (see longitudinal plan of care) Evaluation of current treatment plan related to  self management and patient's adherence to plan as established by provider Reviewed appointments and insured patient has transportation to all appointments   Chronic Kidney Disease (Status: Goal on Track (progressing): YES.) - all recent provider BP readings are meeting treatment targets, patient reports his nephrologist was pleased that his creatinine has stabilized and that his BP reading during office visit was good ,  patient states he checks his  BP daily  and that readings are usually <130/<80 Last practice recorded BP readings:   03/13/22-BP=118/70 BP Readings from Last 3 Encounters:  08/01/21 107/76  06/26/21 110/76  06/19/21 116/76   06/18/22         115-130/60-70      Most recent eGFR/CrCl:  Lab Results  Component Value Date   NA 137 05/15/2021   K 4.1 05/15/2021   CREATININE 1.63 (H) 05/15/2021   EGFR 51 (L) 05/15/2021   GFRNONAA 37 (L) 01/10/2020   GLUCOSE 128 (H) 05/15/2021    Reviewed medications with patient and discussed importance of compliance   Assessed frequency of self monitored BP, reviewed home and provider office readings, reviewed treatment targets and reviewed lifestyle strategies   Reviewed addition of Farxiga to medication regimen for blood pressure, blood sugar and weight control in addition to slowing progression of CKD and lowering risks of heart attack, stroke and cardiac death , assessed tolerance of Farxiga and reviewed major adverse side effects   Reviewed scheduled/upcoming provider appointments  Discussed plans  with patient for ongoing care management follow up and provided patient with direct contact information for care management team    Diabetes:  (Status: Goal on track: YES.)- patient reports self monitored fasting CBGs are usually <130, checks 1-2 times a day.  Most recent A1C=6.1 on 10/17/21. Lab Results  Component Value Date   HGBA1C 7.0 (H) 05/15/2021       HGBA1C                                                      5.7                                     02/18/22 Assessed frequency of CBG testing and reviewed fasting target Reviewed medications, including recent addition of Farxiga to medication regimen with patient and discussed importance of good medication taking behavior Discussed plans with patient for ongoing care management follow up and provided patient with direct contact information for care management team;               Health Maintenance (Status: Condition stable. Not  addressed this visit.) - patient completed colonoscopy and endoscopy on 02/26/21 and was aware of pathology report- non cancerous Patient interviewed about adult health maintenance status   Pneumonia Vaccine-  received pneumococcal  vaccine on 05/15/21 Influenza Vaccine- states he received flu shot on 02/06/21 COVID vaccination   - pt states he never received  Shingles Vaccination- received first dose on 02/06/21, did not receive 2nd vaccination so will message provider prior to his appointment on May 11,2023-second shingles vaccine received 10/17/21 Regular eye checkups- says he sees his eye doctor, Dr [name],  every 6 months due to glaucoma  Advanced Directives- states he does not have and does not want information at this time  Dental Care- states his dentist is Dr Mina Marble in [place] and he sees her once-twice yearly   Hyperlipidemia:  (Status: Condition stable. Not addressed this visit. Lab Results  Component Value Date   CHOL 162 05/15/2021   HDL 48 05/15/2021   LDLCALC 88 05/15/2021   TRIG 147 05/15/2021   CHOLHDL 6.1 (H) 12/20/2019     Medication review performed; medication list updated in electronic medical record.   Hypertension: (Status: Goal on Track (progressing): YES.)-patient states he checks his BP daily and that readings are usually <130/<80.  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 Last practice recorded BP readings:  BP Readings from Last 3 Encounters:  08/01/21 107/76  06/26/21 110/76  06/19/21 116/76  06/17/22          115-130/60-70    Most recent eGFR/CrCl:  Lab Results  Component Value Date   NA 137 05/15/2021   K 4.1 05/15/2021   CREATININE 1.63 (H) 05/15/2021   EGFR 51 (L) 05/15/2021   GFRNONAA 37 (L) 01/10/2020   GLUCOSE 128 (H) 05/15/2021   Evaluation of current treatment plan related to hypertension self management and patient's adherence to plan as established by provider;   Reviewed BP medications  and assessed medication taking behavior ensuring he is taking Losartan that was recently added to his regimen Assessed frequency of self monitored blood press and readings, reviewed treatment targets Discussed plans with patient for ongoing  care management follow up and provided patient with direct contact information for care management team; Reviewed scheduled/upcoming provider appointments  Pain:  (Status: Goal on track: Yes. Medications reviewed Reviewed provider established plan for pain management; Counseled on the importance of reporting any/all new or changed pain symptoms or management strategies to pain management provider;  Weight Loss Interventions:  (Status:  New goal.) Long Term Goal Provided verbal and/or written education to patient re: provider recommended life style modifications  Reviewed recommended dietary changes: avoid fad diets, make small/incremental dietary and exercise changes, eat at the table and avoid eating in front of the TV, plan management of cravings, monitor snacking and cravings in food diary Congratulated patient on good medication taking behavior, ongoing weight loss  and going to gym three times a week for exercise Reviewed [place] benefit related to assisting with weight loss Encouraged patient to reschedule with [name] for medical nutrition therapy  Patient Goals/Self-Care Activities: Take medications as prescribed   Attend all scheduled provider appointments Call pharmacy for medication refills 3-7 days in advance of running out of medications Perform all self care activities independently  Perform IADL's (shopping, preparing meals, housekeeping, managing finances) independently Call provider office for new concerns or questions  Call and schedule dentist appointment with [name] for check up and cleaning as soon as possible , phone number [phone] Reschedule a follow up appointment with [name], dietician and [facility], to help with  ongoing weight loss   Long-Range Goal: Establish Plan of Care for Chronic Disease Management Needs   Priority: High  Note:   Timeframe:  Long-Range Goal Priority:  High Start Date:     10/21/21                        Expected End Date:  ongoing                     Follow Up Date: 07/16/22   - schedule appointment for vaccines needed due to my age or health - schedule recommended health tests (blood work, mammogram, colonoscopy, pap test) - schedule and keep appointment for annual check-up    Why is this important?   Screening tests can find diseases early when they are easier to treat.  Your doctor or nurse will talk with you about which tests are important for you.  Getting shots for common diseases like the flu and shingles will help prevent them.  06/17/22:  Appointment with Nephrology and Pain Management in February   Follow Up:  Patient agrees to Care Plan and Follow-up.  Plan: The Managed Medicaid care management team will reach out to the patient again over the next 30 business  days. and The  Patient has been provided with contact information for the Managed Medicaid care management team and has been advised to call with any health related questions or concerns.  Date/time of next scheduled RN care management/care coordination outreach: 07/16/22 at 1230.

## 2022-06-22 ENCOUNTER — Telehealth: Payer: Self-pay | Admitting: Internal Medicine

## 2022-06-22 NOTE — Telephone Encounter (Signed)
Pt called stating he is needing a PA for OZEMPIC, 2 MG/DOSE, 8 MG/3ML SOPN . Can you please look into this?

## 2022-06-25 NOTE — Telephone Encounter (Signed)
Patient had already picked up meds from pharmacy.

## 2022-07-03 DIAGNOSIS — M62830 Muscle spasm of back: Secondary | ICD-10-CM | POA: Diagnosis not present

## 2022-07-03 DIAGNOSIS — I10 Essential (primary) hypertension: Secondary | ICD-10-CM | POA: Diagnosis not present

## 2022-07-03 DIAGNOSIS — M549 Dorsalgia, unspecified: Secondary | ICD-10-CM | POA: Diagnosis not present

## 2022-07-03 DIAGNOSIS — M160 Bilateral primary osteoarthritis of hip: Secondary | ICD-10-CM | POA: Diagnosis not present

## 2022-07-03 DIAGNOSIS — M5136 Other intervertebral disc degeneration, lumbar region: Secondary | ICD-10-CM | POA: Diagnosis not present

## 2022-07-03 DIAGNOSIS — Z0130 Encounter for examination of blood pressure without abnormal findings: Secondary | ICD-10-CM | POA: Diagnosis not present

## 2022-07-03 DIAGNOSIS — Z6837 Body mass index (BMI) 37.0-37.9, adult: Secondary | ICD-10-CM | POA: Diagnosis not present

## 2022-07-03 DIAGNOSIS — F32A Depression, unspecified: Secondary | ICD-10-CM | POA: Diagnosis not present

## 2022-07-03 DIAGNOSIS — Z79899 Other long term (current) drug therapy: Secondary | ICD-10-CM | POA: Diagnosis not present

## 2022-07-07 ENCOUNTER — Other Ambulatory Visit: Payer: Self-pay | Admitting: Internal Medicine

## 2022-07-07 DIAGNOSIS — G4700 Insomnia, unspecified: Secondary | ICD-10-CM

## 2022-07-07 DIAGNOSIS — E1121 Type 2 diabetes mellitus with diabetic nephropathy: Secondary | ICD-10-CM

## 2022-07-07 MED ORDER — ESZOPICLONE 1 MG PO TABS: 1.0000 mg | ORAL_TABLET | Freq: Every day | ORAL | 0 refills | Status: DC

## 2022-07-13 ENCOUNTER — Other Ambulatory Visit: Payer: Self-pay | Admitting: Internal Medicine

## 2022-07-13 DIAGNOSIS — G4700 Insomnia, unspecified: Secondary | ICD-10-CM

## 2022-07-14 MED ORDER — ESZOPICLONE 1 MG PO TABS: 1.0000 mg | ORAL_TABLET | Freq: Every day | ORAL | 0 refills | Status: DC

## 2022-07-16 ENCOUNTER — Other Ambulatory Visit: Payer: Medicaid Other | Admitting: Obstetrics and Gynecology

## 2022-07-16 NOTE — Patient Outreach (Signed)
  Medicaid Managed Care   Unsuccessful Attempt Note   07/16/2022 Name: [name] MRN: [id number] DOB: [birth year]  Referred by: [name], MD Reason for referral : High Risk Managed Medicaid (Unsuccessful telephone outreach)   An unsuccessful telephone outreach was attempted today. The patient was referred to the case management team for assistance with care management and care coordination.    Follow Up Plan: The Managed Medicaid care management team will reach out to the patient again over the next 30 business  days. and The  Patient has been provided with contact information for the Managed Medicaid care management team and has been advised to call with any health related questions or concerns.    [name] RN, BSN [place]  [facility] - Managed Medicaid High Risk [phone]

## 2022-07-16 NOTE — Patient Instructions (Signed)
  Hi Mr. Tritz, sorry to have missed you today - as a part of your Medicaid benefit, you are eligible for care management and care coordination services at no cost or copay. I was unable to reach you by phone today but would be happy to help you with your health related needs. Please feel free to call me at [phone].   A member of the Managed Medicaid care management team will reach out to you again over the next 30 business  days.   [name] RN, BSN [place]  [facility] - Managed Medicaid High Risk [phone]

## 2022-07-24 DIAGNOSIS — N189 Chronic kidney disease, unspecified: Secondary | ICD-10-CM | POA: Diagnosis not present

## 2022-07-24 DIAGNOSIS — I129 Hypertensive chronic kidney disease with stage 1 through stage 4 chronic kidney disease, or unspecified chronic kidney disease: Secondary | ICD-10-CM | POA: Diagnosis not present

## 2022-07-24 DIAGNOSIS — R809 Proteinuria, unspecified: Secondary | ICD-10-CM | POA: Diagnosis not present

## 2022-07-24 DIAGNOSIS — Z6839 Body mass index (BMI) 39.0-39.9, adult: Secondary | ICD-10-CM | POA: Diagnosis not present

## 2022-07-24 DIAGNOSIS — E1129 Type 2 diabetes mellitus with other diabetic kidney complication: Secondary | ICD-10-CM | POA: Diagnosis not present

## 2022-07-24 DIAGNOSIS — I5032 Chronic diastolic (congestive) heart failure: Secondary | ICD-10-CM | POA: Diagnosis not present

## 2022-07-24 DIAGNOSIS — E1122 Type 2 diabetes mellitus with diabetic chronic kidney disease: Secondary | ICD-10-CM | POA: Diagnosis not present

## 2022-07-29 ENCOUNTER — Other Ambulatory Visit: Payer: Self-pay

## 2022-07-29 ENCOUNTER — Encounter: Payer: Self-pay | Admitting: Internal Medicine

## 2022-07-29 DIAGNOSIS — I129 Hypertensive chronic kidney disease with stage 1 through stage 4 chronic kidney disease, or unspecified chronic kidney disease: Secondary | ICD-10-CM | POA: Diagnosis not present

## 2022-07-29 DIAGNOSIS — E1122 Type 2 diabetes mellitus with diabetic chronic kidney disease: Secondary | ICD-10-CM | POA: Diagnosis not present

## 2022-07-29 DIAGNOSIS — N189 Chronic kidney disease, unspecified: Secondary | ICD-10-CM | POA: Diagnosis not present

## 2022-07-29 DIAGNOSIS — E119 Type 2 diabetes mellitus without complications: Secondary | ICD-10-CM

## 2022-07-29 DIAGNOSIS — R809 Proteinuria, unspecified: Secondary | ICD-10-CM | POA: Diagnosis not present

## 2022-07-29 DIAGNOSIS — Z6839 Body mass index (BMI) 39.0-39.9, adult: Secondary | ICD-10-CM | POA: Diagnosis not present

## 2022-07-29 DIAGNOSIS — I5032 Chronic diastolic (congestive) heart failure: Secondary | ICD-10-CM | POA: Diagnosis not present

## 2022-07-29 DIAGNOSIS — E1129 Type 2 diabetes mellitus with other diabetic kidney complication: Secondary | ICD-10-CM | POA: Diagnosis not present

## 2022-07-29 MED ORDER — LANCET DEVICE MISC: 0 refills | Status: AC

## 2022-07-29 MED ORDER — BLOOD GLUCOSE MONITORING SUPPL DEVI: 1.0000 | Freq: Every day | 0 refills | Status: AC

## 2022-07-29 MED ORDER — BLOOD GLUCOSE TEST VI STRP: ORAL_STRIP | 2 refills | Status: AC

## 2022-07-29 MED ORDER — LANCETS MISC. MISC: 1.0000 | Freq: Every day | 2 refills | Status: AC

## 2022-07-31 DIAGNOSIS — F32A Depression, unspecified: Secondary | ICD-10-CM | POA: Diagnosis not present

## 2022-07-31 DIAGNOSIS — Z0130 Encounter for examination of blood pressure without abnormal findings: Secondary | ICD-10-CM | POA: Diagnosis not present

## 2022-07-31 DIAGNOSIS — M549 Dorsalgia, unspecified: Secondary | ICD-10-CM | POA: Diagnosis not present

## 2022-07-31 DIAGNOSIS — I10 Essential (primary) hypertension: Secondary | ICD-10-CM | POA: Diagnosis not present

## 2022-07-31 DIAGNOSIS — Z79899 Other long term (current) drug therapy: Secondary | ICD-10-CM | POA: Diagnosis not present

## 2022-07-31 DIAGNOSIS — M5136 Other intervertebral disc degeneration, lumbar region: Secondary | ICD-10-CM | POA: Diagnosis not present

## 2022-07-31 DIAGNOSIS — M160 Bilateral primary osteoarthritis of hip: Secondary | ICD-10-CM | POA: Diagnosis not present

## 2022-07-31 DIAGNOSIS — M62830 Muscle spasm of back: Secondary | ICD-10-CM | POA: Diagnosis not present

## 2022-07-31 DIAGNOSIS — Z6837 Body mass index (BMI) 37.0-37.9, adult: Secondary | ICD-10-CM | POA: Diagnosis not present

## 2022-08-05 ENCOUNTER — Other Ambulatory Visit: Payer: Self-pay | Admitting: Internal Medicine

## 2022-08-05 DIAGNOSIS — E1121 Type 2 diabetes mellitus with diabetic nephropathy: Secondary | ICD-10-CM

## 2022-08-09 ENCOUNTER — Other Ambulatory Visit: Payer: Self-pay | Admitting: Internal Medicine

## 2022-08-09 DIAGNOSIS — G4700 Insomnia, unspecified: Secondary | ICD-10-CM

## 2022-08-10 MED ORDER — ESZOPICLONE 1 MG PO TABS: 1.0000 mg | ORAL_TABLET | Freq: Every day | ORAL | 0 refills | Status: DC

## 2022-08-14 ENCOUNTER — Other Ambulatory Visit: Payer: Medicaid Other | Admitting: Obstetrics and Gynecology

## 2022-08-14 ENCOUNTER — Encounter: Payer: Self-pay | Admitting: Obstetrics and Gynecology

## 2022-08-14 NOTE — Patient Instructions (Signed)
Hi [name], great to catch up with you today-have a great weekend!!  [name] was given information about Medicaid Managed Care team care coordination services as a part of their Healthy [facility] Medicaid benefit. [name] verbally consented to engagement with the [facility] Managed Care team.   If you are experiencing a medical emergency, please call 911 or report to your local emergency department or urgent care.   If you have a non-emergency medical problem during routine business hours, please contact your provider's office and ask to speak with a nurse.   For questions related to your Healthy [facility] health plan, please call: [phone] or visit the homepage here: [email or web address]  If you would like to schedule transportation through your Healthy [facility] plan, please call the following number at least 2 days in advance of your appointment: [phone]  For information about your ride after you set it up, call Ride Assist at [phone]. Use this number to activate a Will Call pickup, or if your transportation is late for a scheduled pickup. Use this number, too, if you need to make a change or cancel a previously scheduled reservation.  If you need transportation services right away, call [phone]. The after-hours call center is staffed 24 hours to handle ride assistance and urgent reservation requests (including discharges) 365 days a year. Urgent trips include sick visits, hospital discharge requests and life-sustaining treatment.  Call the [place] at [phone], at any time, 24 hours a day, 7 days a week. If you are in danger or need immediate medical attention call 911.  If you would like help to quit smoking, call 1-800-QUIT-NOW 442-467-6787) OR Espaol: 1-855-Djelo-Ya HD:1601594) o para ms informacin haga clic aqu or Text READY to 200-400 to register via text  [name] - following  are the goals we discussed in your visit today:   Goals Addressed    Timeframe:  Long-Range Goal Priority:  High Start Date:     10/21/21                        Expected End Date:  ongoing                     Follow Up Date: 09/15/22   - schedule appointment for vaccines needed due to my age or health - schedule recommended health tests (blood work, mammogram, colonoscopy, pap test) - schedule and keep appointment for annual check-up    Why is this important?   Screening tests can find diseases early when they are easier to treat.  Your doctor or nurse will talk with you about which tests are important for you.  Getting shots for common diseases like the flu and shingles will help prevent them.  08/14/22:  recent appointments with Nephrology, Pain Management and Ophthalmology  Patient verbalizes understanding of instructions and care plan provided today and agrees to view in [place]. Active MyChart status and patient understanding of how to access instructions and care plan via MyChart confirmed with patient.     The Managed Medicaid care management team will reach out to the patient again over the next 30 business  days.  The  Patient  has been provided with contact information for the Managed Medicaid care management team and has been advised to call with any health related questions or concerns.   [name] Raider RN, BSN [name]  [facility] - Managed Medicaid High Risk  [phone]   Following is a copy of your plan of care:  Care Plan : [place]  Updates made by [name] Medicus, RN since 08/14/2022 12:00 AM     Problem: Chronic Disease Management and Care Coordination Needs for DM, HTN, HLD, CKD      Long-Range Goal: Development of Plan Of Care For Chronic Disease Management and Care Coordination Needs to Assist With Meeting Treatment Goals for DM, CKD, HTN, obesity, chronic hip pain   Start Date: 01/07/2021   Expected End Date: 11/14/2022  Priority: High  Note:   Current Barriers:  Knowledge Deficits related to plan of care for management of HTN, HLD, CKD, and DMII, chronic pain, obesity Chronic Disease Management support and education needs related to HTN, HLD, and DMII, chronic pain, obesity 08/14/22:  No complaints today, Blood pressure medication discontinued.  Blood sugars [id number]  RNCM Clinical Goal(s):  Patient will verbalize understanding of plan for management of HTN, HLD, CKD and DMII take all medications exactly as prescribed and will call provider for medication related questions attend all scheduled medical appointments:  demonstrate ongoing adherence to prescribed treatment plan for HTN, HLD, CKD and DMII as evidenced by daily/ twice daily monitoring and recording of CBG,  adherence to ADA/ carb modified, fat modified, no added salt diet, continue 30-40 minutes of exercise on your NuStep 5-7 days/week, adherence to prescribed medication regimen including new diabetes medication,  contacting provider for new or worsened symptoms or questions related to HTN, DM or HLD, CKD continue to work with Consulting civil engineer and managed Medicaid pharmacist to address care management and care coordination needs related to HTN, HLD, and DMII , CKD  Interventions: Inter-disciplinary care team collaboration (see longitudinal plan of care) Evaluation of current treatment plan related to  self management and patient's adherence to plan as established by provider Reviewed appointments and insured patient has transportation to all appointments   Chronic Kidney Disease (Status: Goal on Track (progressing): YES.) - all recent provider BP readings are meeting treatment targets, patient reports his nephrologist was pleased that his creatinine has stabilized and that his BP reading during office visit was good ,  patient states he checks his BP daily  and that readings are usually <130/<80 Last practice recorded  BP readings:   03/13/22-BP=118/70 BP Readings from Last 3 Encounters:  08/01/21 107/76  06/26/21 110/76  06/19/21 116/76   06/18/22         115-130/60-70      Most recent eGFR/CrCl:  Lab Results  Component Value Date   NA 137 05/15/2021   K 4.1 05/15/2021   CREATININE 1.63 (H) 05/15/2021   EGFR 51 (L) 05/15/2021   GFRNONAA 37 (L) 01/10/2020   GLUCOSE 128 (H) 05/15/2021    Reviewed medications with patient and discussed importance of compliance   Assessed frequency of self monitored BP, reviewed home and provider office readings, reviewed treatment targets and reviewed lifestyle strategies   Reviewed addition of Farxiga to medication regimen for blood pressure, blood sugar and weight control in addition to slowing progression of CKD and lowering risks of heart attack, stroke and cardiac death , assessed tolerance of Farxiga and reviewed major adverse side effects   Reviewed scheduled/upcoming provider appointments  Discussed plans with patient for ongoing care management follow up and provided patient with direct contact information for care management team    Diabetes:  (Status: Goal on track: YES.)- patient reports self monitored fasting CBGs are usually <130, checks  1-2 times a day.  Lab Results  Component Value Date   HGBA1C 7.0 (H) 05/15/2021       HGBA1C                                                      5.7                                     02/18/22 Assessed frequency of CBG testing and reviewed fasting target Reviewed medications, including recent addition of Farxiga to medication regimen with patient and discussed importance of good medication taking behavior Discussed plans with patient for ongoing care management follow up and provided patient with direct contact information for care management team;               Health Maintenance (Status: Condition stable. Not addressed this visit.) - patient completed colonoscopy and endoscopy on 02/26/21 and was aware of pathology  report- non cancerous Patient interviewed about adult health maintenance status   Pneumonia Vaccine-  received pneumococcal  vaccine on 05/15/21 Influenza Vaccine- states he received flu shot on 02/06/21 COVID vaccination   - pt states he never received  Shingles Vaccination- received first dose on 02/06/21, did not receive 2nd vaccination so will message provider prior to his appointment on May 11,2023-second shingles vaccine received 10/17/21 Regular eye checkups- says he sees his eye doctor, Dr [name],  every 6 months due to glaucoma  Advanced Directives- states he does not have and does not want information at this time  Dental Care- states his dentist is Dr Mina Marble in [place] and he sees her once-twice yearly   Hyperlipidemia:  (Status: Condition stable. Not addressed this visit. Lab Results  Component Value Date   CHOL 162 05/15/2021   HDL 48 05/15/2021   LDLCALC 88 05/15/2021   TRIG 147 05/15/2021   CHOLHDL 6.1 (H) 12/20/2019     Medication review performed; medication list updated in electronic medical record.   Hypertension: (Status: Goal on Track (progressing): YES.)-patient states he checks his BP daily and that readings are usually <130/<80.  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 Last practice recorded BP readings:  BP Readings from Last 3 Encounters:  08/01/21 107/76  06/26/21 110/76  06/19/21 116/76  06/17/22          115-130/60-70    Most recent eGFR/CrCl:  Lab Results  Component Value Date   NA 137 05/15/2021   K 4.1 05/15/2021   CREATININE 1.63 (H) 05/15/2021   EGFR 51 (L) 05/15/2021   GFRNONAA 37 (L) 01/10/2020   GLUCOSE 128 (H) 05/15/2021   Evaluation of current treatment plan related to hypertension self management and patient's adherence to plan as established by provider;   Reviewed BP medications and assessed medication taking behavior ensuring he is taking Losartan that was recently added to his  regimen Assessed frequency of self monitored blood press and readings, reviewed treatment targets Discussed plans with patient for ongoing care management follow up and provided patient with direct contact information for care management team; Reviewed scheduled/upcoming provider appointments 08/14/22:  BP medication discontinued  Pain:  (Status: Goal on track: Yes. Medications reviewed Reviewed provider established plan for pain management; Counseled on the  importance of reporting any/all new or changed pain symptoms or management strategies to pain management provider;  Weight Loss Interventions:  (Status:  New goal.) Long Term Goal Provided verbal and/or written education to patient re: provider recommended life style modifications  Reviewed recommended dietary changes: avoid fad diets, make small/incremental dietary and exercise changes, eat at the table and avoid eating in front of the TV, plan management of cravings, monitor snacking and cravings in food diary Congratulated patient on good medication taking behavior, ongoing weight loss  and going to gym three times a week for exercise Reviewed [facility] benefit related to assisting with weight loss Encouraged patient to reschedule with [name] for medical nutrition therapy  Patient Goals/Self-Care Activities: Take medications as prescribed   Attend all scheduled provider appointments Call pharmacy for medication refills 3-7 days in advance of running out of medications Perform all self care activities independently  Perform IADL's (shopping, preparing meals, housekeeping, managing finances) independently Call provider office for new concerns or questions  Call and schedule dentist appointment with [name] for check up and cleaning as soon as possible , phone number [phone] Reschedule a follow up appointment with [name], dietician and CDC[facility], to help with ongoing weight loss

## 2022-08-14 NOTE — Patient Outreach (Signed)
Medicaid Managed Care   Nurse Care Manager Note  08/14/2022 Name:  [name] MRN:  [id number] DOB:  Jun 20, 1970  [name] is an 52 y.o. year old male who is a primary patient of [name], MD.  The [facility] Managed Care Coordination team was consulted for assistance with:    Chronic healthcare management needs, DM, chronic pain, CKD, osteoarthritis, HLD, CKD, GERD  [name] was given information about Medicaid Managed Care Coordination team services today. [name] Patient agreed to services and verbal consent obtained.  Engaged with patient by telephone for follow up visit in response to provider referral for case management and/or care coordination services.   Assessments/Interventions:  Review of past medical history, allergies, medications, health status, including review of consultants reports, laboratory and other test data, was performed as part of comprehensive evaluation and provision of chronic care management services.  SDOH (Social Determinants of Health) assessments and interventions performed: SDOH Interventions    Flowsheet Row Patient Outreach Telephone from 08/14/2022 in [place] Patient Outreach Telephone from 06/18/2022 in [place] Patient Outreach Telephone from 03/13/2022 in [place] Patient Outreach Telephone from 02/09/2022 in [place] Patient Outreach Telephone from 12/30/2021 in [place] Patient Outreach Telephone from 11/21/2021 in [place] Coordination  SDOH Interventions        Food Insecurity Interventions Intervention Not Indicated -- -- Intervention Not Indicated -- Intervention Not Indicated  Utilities Interventions -- -- -- Intervention Not Indicated -- --  Alcohol Usage Interventions -- -- Intervention Not Indicated  (Score <7) -- -- --  Financial Strain Interventions Intervention Not Indicated -- -- -- Intervention Not Indicated --  Physical Activity Interventions -- Intervention Not Indicated  [goes to gym 3 times a week] -- -- -- --  Stress Interventions -- Intervention Not Indicated -- -- -- --     Care Plan  No Known Allergies  Medications Reviewed Today     Reviewed by Gayla Medicus, RN (Registered Nurse) on 08/14/22 at 56  Med List Status: <None>   Medication Order Taking? Sig Documenting Provider Last Dose Status Informant  acetaminophen (TYLENOL) 500 MG tablet [id number] No Take 2 tablets (1,000 mg total) by mouth every 6 (six) hours as needed for mild pain or moderate pain. Britt Bottom, PA-C Taking Active Self  atorvastatin (LIPITOR) 40 MG tablet [id number] No Take 1 tablet (40 mg total) by mouth daily. [name], MD Taking Active   Azelastine HCl (ASTEPRO NA) [id number] No Place 1 spray into the nose daily as needed (allergies). [provider] Taking Active Self  blood glucose meter kit and supplies KIT [id number] No Test daily Dx  r73.03 [name] Deutscher, DO Taking Active Self  Blood Glucose Monitoring Suppl DEVI [id number]  1 each by Does not apply route daily. May substitute to any manufacturer covered by patient's insurance. [name], MD  Active   Blood Pressure Monitor KIT [id number] No 1 each by Does not apply route daily. [name], NP Taking Active Self  eszopiclone (LUNESTA) 1 MG TABS tablet [id number]  Take 1 tablet (1 mg total) by mouth at bedtime. Take immediately before bedtime [name], MD  Active   FARXIGA 5 MG TABS tablet [id number] No Take 1 tablet (5 mg total) by mouth every morning. [name], MD Taking Active   ferrous sulfate 325 (  65 FE) MG tablet [id number] No Take 325 mg by mouth every other day. [provider] Taking Active Self  gabapentin (NEURONTIN) 300 MG capsule [id number] No TAKE (1) CAPSULE BY MOUTH AT BEDTIME.  [name], MD Taking Active   Glucose Blood (BLOOD GLUCOSE TEST STRIPS) STRP [id number]  Once daily testing May substitute to any manufacturer covered by patient's insurance. [name], MD  Active   glucose blood test strip [id number] No Use as instructed. Can check up to 4 times daily. [name], NP Taking Active Self  KERENDIA 10 MG TABS [id number] No Take 10 mg by mouth daily. [provider] Taking Active Self  Lancet Device [place] [id number]  Once daily dx e11.9 May substitute to any manufacturer covered by patient's insurance. [name], MD  Active   Lancets Misc. [place] [id number]  1 each by Does not apply route daily. May substitute to any manufacturer covered by patient's insurance. [name], MD  Active   latanoprost (XALATAN) 0.005 % ophthalmic solution [id number] No Place 1 drop into both eyes at bedtime. [provider] Taking Active Self  loratadine (CLARITIN) 10 MG tablet [id number] No Take 10 mg by mouth daily. [provider] Taking Active Self  Melatonin 10 MG CAPS [id number] No Take 10 mg by mouth at bedtime. [provider] Taking Active Self  methocarbamol (ROBAXIN-750) 750 MG tablet [id number] No Take 1 tablet (750 mg total) by mouth every 8 (eight) hours as needed for muscle spasms (DO NOT TAKE WITH OTHER MUSCLE RELAXERS!). [name] M, PA-C Taking Active   Multiple Vitamin (MULTIVITAMIN) tablet [id number] No Take 1 tablet by mouth daily. One a day [provider] Taking Active Self  Oxycodone HCl 20 MG TABS [id number] No Take 20 mg by mouth 4 (four) times daily as needed (pain). [provider] Taking Active Self           Med Note [name]   Thu Nov 27, 2021 12:05 PM)    OZEMPIC, 2 MG/DOSE, 8 MG/3ML SOPN [id number]  INJECT 2 MG INTO THE SKIN ONCE WEEKLY. [name], MD  Active   sildenafil (VIAGRA) 50 MG tablet [id number] No Take 1 tablet (50 mg total) by mouth daily as needed for erectile  dysfunction. [name] Rival, FNP Taking Active Self  timolol (BETIMOL) 0.5 % ophthalmic solution [id number] No Place 1 drop into both eyes in the morning. [provider] Taking Active Self           Patient Active Problem List   Diagnosis Date Noted   Insomnia 05/26/2022   Elevated alkaline phosphatase level 05/26/2022   S/P total right hip arthroplasty 03/03/2022   Annual visit for general adult medical examination with abnormal findings 02/17/2022   S/P total left hip arthroplasty 12/16/2021   Need for varicella vaccine 10/16/2021   OSA  09/11/2021   Pre-operative clearance 06/19/2021   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   Immunization due 02/06/2021   Iron deficiency anemia 12/30/2020   History of colonic polyps 12/30/2020   Unilateral primary osteoarthritis, right hip 08/13/2020   Unilateral primary osteoarthritis, left hip 08/13/2020   Muscle spasms of both lower extremities 05/23/2020   Need for immunization against influenza 03/06/2020   Primary osteoarthritis of both hips 07/10/2019   Bilateral primary osteoarthritis of hip 07/10/2019   Vitamin D deficiency 04/13/2019   Morbid obesity with body mass index (BMI) of 40.0 to 44.9 in  adult ([place]) 04/13/2019   Depressive disorder 04/13/2019   Body mass index (BMI) 45.0-49.9, adult ([place]) 04/13/2019   Kidney disease, chronic, stage III (GFR 30-59 ml/min) ([place]) 07/20/2017   History of cerebrovascular accident 07/20/2017   Gastroesophageal reflux disease 07/20/2017   Gastric bypass status for obesity 02/11/2017   Benzodiazepine dependence ([place]) 02/11/2017   Type 2 diabetes mellitus with diabetic nephropathy ([place]) 08/03/2008   Hyperlipidemia 08/03/2008   Hypertension 08/03/2008   SLEEP APNEA 08/03/2008   Sleep apnea 08/03/2008   Conditions to be addressed/monitored per PCP order:  Chronic healthcare management needs, DM, chronic pain, CKD, osteoarthritis, HLD,  CKD, GERD  Care Plan : [place]  Updates made by Gayla Medicus, RN since 08/14/2022 12:00 AM     Problem: Chronic Disease Management and Care Coordination Needs for DM, HTN, HLD, CKD      Long-Range Goal: Development of Plan Of Care For Chronic Disease Management and Care Coordination Needs to Assist With Meeting Treatment Goals for DM, CKD, HTN, obesity, chronic hip pain   Start Date: 01/07/2021  Expected End Date: 11/14/2022  Priority: High  Note:   Current Barriers:  Knowledge Deficits related to plan of care for management of HTN, HLD, CKD, and DMII, chronic pain, obesity Chronic Disease Management support and education needs related to HTN, HLD, and DMII, chronic pain, obesity 08/14/22:  No complaints today, Blood pressure medication discontinued.  Blood sugars [id number]  RNCM Clinical Goal(s):  Patient will verbalize understanding of plan for management of HTN, HLD, CKD and DMII take all medications exactly as prescribed and will call provider for medication related questions attend all scheduled medical appointments:  demonstrate ongoing adherence to prescribed treatment plan for HTN, HLD, CKD and DMII as evidenced by daily/ twice daily monitoring and recording of CBG,  adherence to ADA/ carb modified, fat modified, no added salt diet, continue 30-40 minutes of exercise on your NuStep 5-7 days/week, adherence to prescribed medication regimen including new diabetes medication,  contacting provider for new or worsened symptoms or questions related to HTN, DM or HLD, CKD continue to work with Consulting civil engineer and managed Medicaid pharmacist to address care management and care coordination needs related to HTN, HLD, and DMII , CKD  Interventions: Inter-disciplinary care team collaboration (see longitudinal plan of care) Evaluation of current treatment plan related to  self management and patient's adherence to plan as established by provider Reviewed appointments and  insured patient has transportation to all appointments   Chronic Kidney Disease (Status: Goal on Track (progressing): YES.) - all recent provider BP readings are meeting treatment targets, patient reports his nephrologist was pleased that his creatinine has stabilized and that his BP reading during office visit was good ,  patient states he checks his BP daily  and that readings are usually <130/<80 Last practice recorded BP readings:   03/13/22-BP=118/70 BP Readings from Last 3 Encounters:  08/01/21 107/76  06/26/21 110/76  06/19/21 116/76   06/18/22         115-130/60-70      Most recent eGFR/CrCl:  Lab Results  Component Value Date   NA 137 05/15/2021   K 4.1 05/15/2021   CREATININE 1.63 (H) 05/15/2021   EGFR 51 (L) 05/15/2021   GFRNONAA 37 (L) 01/10/2020   GLUCOSE 128 (H) 05/15/2021    Reviewed medications with patient and discussed importance of compliance   Assessed frequency of self monitored BP, reviewed home and provider office readings, reviewed treatment targets and  reviewed lifestyle strategies   Reviewed addition of Farxiga to medication regimen for blood pressure, blood sugar and weight control in addition to slowing progression of CKD and lowering risks of heart attack, stroke and cardiac death , assessed tolerance of Farxiga and reviewed major adverse side effects   Reviewed scheduled/upcoming provider appointments  Discussed plans with patient for ongoing care management follow up and provided patient with direct contact information for care management team    Diabetes:  (Status: Goal on track: YES.)- patient reports self monitored fasting CBGs are usually <130, checks 1-2 times a day.  Lab Results  Component Value Date   HGBA1C 7.0 (H) 05/15/2021       HGBA1C                                                      5.7                                     02/18/22 Assessed frequency of CBG testing and reviewed fasting target Reviewed medications, including recent addition  of Farxiga to medication regimen with patient and discussed importance of good medication taking behavior Discussed plans with patient for ongoing care management follow up and provided patient with direct contact information for care management team;               Health Maintenance (Status: Condition stable. Not addressed this visit.) - patient completed colonoscopy and endoscopy on 02/26/21 and was aware of pathology report- non cancerous Patient interviewed about adult health maintenance status   Pneumonia Vaccine-  received pneumococcal  vaccine on 05/15/21 Influenza Vaccine- states he received flu shot on 02/06/21 COVID vaccination   - pt states he never received  Shingles Vaccination- received first dose on 02/06/21, did not receive 2nd vaccination so will message provider prior to his appointment on May 11,2023-second shingles vaccine received 10/17/21 Regular eye checkups- says he sees his eye doctor, Dr [name],  every 6 months due to glaucoma  Advanced Directives- states he does not have and does not want information at this time  Dental Care- states his dentist is Dr Mina Marble in [place] and he sees her once-twice yearly   Hyperlipidemia:  (Status: Condition stable. Not addressed this visit. Lab Results  Component Value Date   CHOL 162 05/15/2021   HDL 48 05/15/2021   LDLCALC 88 05/15/2021   TRIG 147 05/15/2021   CHOLHDL 6.1 (H) 12/20/2019     Medication review performed; medication list updated in electronic medical record.   Hypertension: (Status: Goal on Track (progressing): YES.)-patient states he checks his BP daily and that readings are usually <130/<80.  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 Last practice recorded BP readings:  BP Readings from Last 3 Encounters:  08/01/21 107/76  06/26/21 110/76  06/19/21 116/76  06/17/22          115-130/60-70    Most recent eGFR/CrCl:  Lab Results  Component Value Date   NA 137  05/15/2021   K 4.1 05/15/2021   CREATININE 1.63 (H) 05/15/2021   EGFR 51 (L) 05/15/2021   GFRNONAA 37 (L) 01/10/2020   GLUCOSE 128 (H) 05/15/2021   Evaluation of current treatment plan related to  hypertension self management and patient's adherence to plan as established by provider;   Reviewed BP medications and assessed medication taking behavior ensuring he is taking Losartan that was recently added to his regimen Assessed frequency of self monitored blood press and readings, reviewed treatment targets Discussed plans with patient for ongoing care management follow up and provided patient with direct contact information for care management team; Reviewed scheduled/upcoming provider appointments 08/14/22:  BP medication discontinued  Pain:  (Status: Goal on track: Yes. Medications reviewed Reviewed provider established plan for pain management; Counseled on the importance of reporting any/all new or changed pain symptoms or management strategies to pain management provider;  Weight Loss Interventions:  (Status:  New goal.) Long Term Goal Provided verbal and/or written education to patient re: provider recommended life style modifications  Reviewed recommended dietary changes: avoid fad diets, make small/incremental dietary and exercise changes, eat at the table and avoid eating in front of the TV, plan management of cravings, monitor snacking and cravings in food diary Congratulated patient on good medication taking behavior, ongoing weight loss  and going to gym three times a week for exercise Reviewed [place] benefit related to assisting with weight loss Encouraged patient to reschedule with [name] for medical nutrition therapy  Patient Goals/Self-Care Activities: Take medications as prescribed   Attend all scheduled provider appointments Call pharmacy for medication refills 3-7 days in advance of running out of medications Perform all self care activities independently   Perform IADL's (shopping, preparing meals, housekeeping, managing finances) independently Call provider office for new concerns or questions  Call and schedule dentist appointment with [name] for check up and cleaning as soon as possible , phone number [phone] Reschedule a follow up appointment with [name], dietician and [facility], to help with ongoing weight loss   Long-Range Goal: Establish Plan of Care for Chronic Disease Management Needs   Priority: High  Note:   Timeframe:  Long-Range Goal Priority:  High Start Date:     10/21/21                        Expected End Date:  ongoing                     Follow Up Date: 09/15/22   - schedule appointment for vaccines needed due to my age or health - schedule recommended health tests (blood work, mammogram, colonoscopy, pap test) - schedule and keep appointment for annual check-up    Why is this important?   Screening tests can find diseases early when they are easier to treat.  Your doctor or nurse will talk with you about which tests are important for you.  Getting shots for common diseases like the flu and shingles will help prevent them.  08/14/22:  recent appointments with Nephrology, Pain Management and Ophthalmology   Follow Up:  Patient agrees to Care Plan and Follow-up.  Plan: The Managed Medicaid care management team will reach out to the patient again over the next 30 business  days. and The  Patient has been provided with contact information for the Managed Medicaid care management team and has been advised to call with any health related questions or concerns.  Date/time of next scheduled RN care management/care coordination outreach:  09/16/22 at 1230

## 2022-08-17 ENCOUNTER — Other Ambulatory Visit: Payer: Self-pay | Admitting: Internal Medicine

## 2022-08-17 DIAGNOSIS — E1121 Type 2 diabetes mellitus with diabetic nephropathy: Secondary | ICD-10-CM

## 2022-08-24 ENCOUNTER — Encounter: Payer: Self-pay | Admitting: Internal Medicine

## 2022-08-24 DIAGNOSIS — G4700 Insomnia, unspecified: Secondary | ICD-10-CM

## 2022-08-24 MED ORDER — ESZOPICLONE 2 MG PO TABS: 2.0000 mg | ORAL_TABLET | Freq: Every evening | ORAL | 0 refills | Status: DC | PRN

## 2022-09-01 DIAGNOSIS — M549 Dorsalgia, unspecified: Secondary | ICD-10-CM | POA: Diagnosis not present

## 2022-09-01 DIAGNOSIS — I10 Essential (primary) hypertension: Secondary | ICD-10-CM | POA: Diagnosis not present

## 2022-09-01 DIAGNOSIS — E559 Vitamin D deficiency, unspecified: Secondary | ICD-10-CM | POA: Diagnosis not present

## 2022-09-01 DIAGNOSIS — M5136 Other intervertebral disc degeneration, lumbar region: Secondary | ICD-10-CM | POA: Diagnosis not present

## 2022-09-01 DIAGNOSIS — M62830 Muscle spasm of back: Secondary | ICD-10-CM | POA: Diagnosis not present

## 2022-09-01 DIAGNOSIS — Z0130 Encounter for examination of blood pressure without abnormal findings: Secondary | ICD-10-CM | POA: Diagnosis not present

## 2022-09-01 DIAGNOSIS — F32A Depression, unspecified: Secondary | ICD-10-CM | POA: Diagnosis not present

## 2022-09-01 DIAGNOSIS — Z79899 Other long term (current) drug therapy: Secondary | ICD-10-CM | POA: Diagnosis not present

## 2022-09-01 DIAGNOSIS — M129 Arthropathy, unspecified: Secondary | ICD-10-CM | POA: Diagnosis not present

## 2022-09-01 DIAGNOSIS — Z6838 Body mass index (BMI) 38.0-38.9, adult: Secondary | ICD-10-CM | POA: Diagnosis not present

## 2022-09-01 DIAGNOSIS — M160 Bilateral primary osteoarthritis of hip: Secondary | ICD-10-CM | POA: Diagnosis not present

## 2022-09-05 ENCOUNTER — Other Ambulatory Visit: Payer: Self-pay | Admitting: Internal Medicine

## 2022-09-05 DIAGNOSIS — E1121 Type 2 diabetes mellitus with diabetic nephropathy: Secondary | ICD-10-CM

## 2022-09-07 DIAGNOSIS — Z79899 Other long term (current) drug therapy: Secondary | ICD-10-CM | POA: Diagnosis not present

## 2022-09-16 ENCOUNTER — Other Ambulatory Visit: Payer: Medicaid Other | Admitting: Obstetrics and Gynecology

## 2022-09-16 ENCOUNTER — Encounter: Payer: Self-pay | Admitting: Obstetrics and Gynecology

## 2022-09-16 NOTE — Patient Instructions (Signed)
Hi [name], I glad you are doing so well-have a wonderful afternoon!  [name] was given information about Medicaid Managed Care team care coordination services as a part of their Healthy [facility] Medicaid benefit. [name] verbally consented to engagement with the [facility] Managed Care team.   If you are experiencing a medical emergency, please call 911 or report to your local emergency department or urgent care.   If you have a non-emergency medical problem during routine business hours, please contact your provider's office and ask to speak with a nurse.   For questions related to your Healthy [facility] health plan, please call: [phone] or visit the homepage here: [email or web address]  If you would like to schedule transportation through your Healthy [facility] plan, please call the following number at least 2 days in advance of your appointment: [phone]  For information about your ride after you set it up, call Ride Assist at [phone]. Use this number to activate a Will Call pickup, or if your transportation is late for a scheduled pickup. Use this number, too, if you need to make a change or cancel a previously scheduled reservation.  If you need transportation services right away, call [phone]. The after-hours call center is staffed 24 hours to handle ride assistance and urgent reservation requests (including discharges) 365 days a year. Urgent trips include sick visits, hospital discharge requests and life-sustaining treatment.  Call the [facility] Line at [phone], at any time, 24 hours a day, 7 days a week. If you are in danger or need immediate medical attention call 911.  If you would like help to quit smoking, call 1-800-QUIT-NOW (743-017-9542) OR Espaol: 1-855-Djelo-Ya (4-742-595-6387) o para ms informacin haga clic aqu or Text READY to 564-332 to register via text  Mr. Gentle - following  are the goals we discussed in your visit today:   Goals Addressed    Timeframe:  Long-Range Goal Priority:  High Start Date:     10/21/21                        Expected End Date:  ongoing                     Follow Up Date: 11/16/22   - schedule appointment for vaccines needed due to my age or health - schedule recommended health tests (blood work, mammogram, colonoscopy, pap test) - schedule and keep appointment for annual check-up    Why is this important?   Screening tests can find diseases early when they are easier to treat.  Your doctor or nurse will talk with you about which tests are important for you.  Getting shots for common diseases like the flu and shingles will help prevent them.  09/16/22:  patient has appts upcoming with Nephrology and Pain Management  Patient verbalizes understanding of instructions and care plan provided today and agrees to view in MyChart. Active MyChart status and patient understanding of how to access instructions and care plan via MyChart confirmed with patient.     The Managed Medicaid care management team will reach out to the patient again over the next 30 business  days.  The  Patient  has been provided with contact information for the Managed Medicaid care management team and has been advised to call with any health related questions or concerns.   [name] Der RN, BSN [place]  [employer], production - Managed Medicaid High  Risk [phone]   Following is a copy of your plan of care:  Care Plan : RN Care Manager Plan Of Care  Updates made by Danie Chandler, RN since 09/16/2022 12:00 AM     Problem: Chronic Disease Management and Care Coordination Needs for DM, HTN, HLD, CKD      Long-Range Goal: Development of Plan Of Care For Chronic Disease Management and Care Coordination Needs to Assist With Meeting Treatment Goals for DM, CKD, HTN, obesity, chronic hip pain   Start Date: 01/07/2021  Expected End  Date: 12/16/2022  Priority: High  Note:   Current Barriers:  Knowledge Deficits related to plan of care for management of HTN, HLD, CKD, and DMII, chronic pain, obesity Chronic Disease Management support and education needs related to HTN, HLD, and DMII, chronic pain, obesity 09/16/22:  BP stable, BG stable-118-138.  No complaints today-has f/u with Nephrology and Pain Management soon for low back pain.  RNCM Clinical Goal(s):  Patient will verbalize understanding of plan for management of HTN, HLD, CKD and DMII take all medications exactly as prescribed and will call provider for medication related questions attend all scheduled medical appointments:  demonstrate ongoing adherence to prescribed treatment plan for HTN, HLD, CKD and DMII as evidenced by daily/ twice daily monitoring and recording of CBG,  adherence to ADA/ carb modified, fat modified, no added salt diet, continue 30-40 minutes of exercise on your NuStep 5-7 days/week, adherence to prescribed medication regimen including new diabetes medication,  contacting provider for new or worsened symptoms or questions related to HTN, DM or HLD, CKD continue to work with Medical illustrator and managed Medicaid pharmacist to address care management and care coordination needs related to HTN, HLD, and DMII , CKD  Interventions: Inter-disciplinary care team collaboration (see longitudinal plan of care) Evaluation of current treatment plan related to  self management and patient's adherence to plan as established by provider Reviewed appointments and insured patient has transportation to all appointments   Chronic Kidney Disease (Status: Goal on Track (progressing): YES.) - all recent provider BP readings are meeting treatment targets, patient reports his nephrologist was pleased that his creatinine has stabilized and that his BP reading during office visit was good ,  patient states he checks his BP daily  and that readings are usually <130/<80 Last  practice recorded BP readings:   03/13/22-BP=118/70 BP Readings from Last 3 Encounters:  08/01/21 107/76  06/26/21 110/76  06/19/21 116/76   06/18/22         115-130/60-70      Most recent eGFR/CrCl:  Lab Results  Component Value Date   NA 137 05/15/2021   K 4.1 05/15/2021   CREATININE 1.63 (H) 05/15/2021   EGFR 51 (L) 05/15/2021   GFRNONAA 37 (L) 01/10/2020   GLUCOSE 128 (H) 05/15/2021    Reviewed medications with patient and discussed importance of compliance   Assessed frequency of self monitored BP, reviewed home and provider office readings, reviewed treatment targets and reviewed lifestyle strategies   Reviewed addition of Farxiga to medication regimen for blood pressure, blood sugar and weight control in addition to slowing progression of CKD and lowering risks of heart attack, stroke and cardiac death , assessed tolerance of Farxiga and reviewed major adverse side effects   Reviewed scheduled/upcoming provider appointments  Discussed plans with patient for ongoing care management follow up and provided patient with direct contact information for care management team    Diabetes:  (Status: Goal on track: YES.)- patient  reports self monitored fasting CBGs are usually <130, checks 1-2 times a day.  Lab Results  Component Value Date   HGBA1C 7.0 (H) 05/15/2021       HGBA1C                                                      5.7                                     02/18/22 Assessed frequency of CBG testing and reviewed fasting target Reviewed medications, including recent addition of Farxiga to medication regimen with patient and discussed importance of good medication taking behavior Discussed plans with patient for ongoing care management follow up and provided patient with direct contact information for care management team;               Health Maintenance (Status: Condition stable. Not addressed this visit.) - patient completed colonoscopy and endoscopy on 02/26/21 and was aware  of pathology report- non cancerous Patient interviewed about adult health maintenance status   Pneumonia Vaccine-  received pneumococcal  vaccine on 05/15/21 Influenza Vaccine- states he received flu shot on 02/06/21 COVID vaccination   - pt states he never received  Shingles Vaccination- received first dose on 02/06/21, did not receive 2nd vaccination so will message provider prior to his appointment on May 11,2023-second shingles vaccine received 10/17/21 Regular eye checkups- says he sees his eye doctor, Dr Dione Booze,  every 6 months due to glaucoma  Advanced Directives- states he does not have and does not want information at this time  Dental Care- states his dentist is Dr Regino Schultze in [place] and he sees her once-twice yearly   Hyperlipidemia:  (Status: Condition stable. Not addressed this visit. Lab Results  Component Value Date   CHOL 162 05/15/2021   HDL 48 05/15/2021   LDLCALC 88 05/15/2021   TRIG 147 05/15/2021   CHOLHDL 6.1 (H) 12/20/2019     Medication review performed; medication list updated in electronic medical record.   Hypertension: (Status: Goal on Track (progressing): YES.)-patient states he checks his BP daily and that readings are usually <130/<80.  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 Last practice recorded BP readings:  BP Readings from Last 3 Encounters:  08/01/21 107/76  06/26/21 110/76  06/19/21 116/76  06/17/22          115-130/60-70 O4/24/24        119/78    Most recent eGFR/CrCl:  Lab Results  Component Value Date   NA 137 05/15/2021   K 4.1 05/15/2021   CREATININE 1.63 (H) 05/15/2021   EGFR 51 (L) 05/15/2021   GFRNONAA 37 (L) 01/10/2020   GLUCOSE 128 (H) 05/15/2021   Evaluation of current treatment plan related to hypertension self management and patient's adherence to plan as established by provider;   Reviewed BP medications and assessed medication taking behavior ensuring he is taking Losartan  that was recently added to his regimen Assessed frequency of self monitored blood press and readings, reviewed treatment targets Discussed plans with patient for ongoing care management follow up and provided patient with direct contact information for care management team; Reviewed scheduled/upcoming provider appointments 08/14/22:  BP medication discontinued  Pain:  (  Status: Goal on track: Yes. Medications reviewed Reviewed provider established plan for pain management; Counseled on the importance of reporting any/all new or changed pain symptoms or management strategies to pain management provider;  Weight Loss Interventions:  (Status:  New goal.) Long Term Goal Provided verbal and/or written education to patient re: provider recommended life style modifications  Reviewed recommended dietary changes: avoid fad diets, make small/incremental dietary and exercise changes, eat at the table and avoid eating in front of the TV, plan management of cravings, monitor snacking and cravings in food diary Congratulated patient on good medication taking behavior, ongoing weight loss  and going to gym three times a week for exercise Reviewed Marcelline Deist benefit related to assisting with weight loss Encouraged patient to reschedule with Norm Salt for medical nutrition therapy  Patient Goals/Self-Care Activities: Take medications as prescribed   Attend all scheduled provider appointments Call pharmacy for medication refills 3-7 days in advance of running out of medications Perform all self care activities independently  Perform IADL's (shopping, preparing meals, housekeeping, managing finances) independently Call provider office for new concerns or questions  Call and schedule dentist appointment with Dr. Regino Schultze for check up and cleaning as soon as possible , phone number [phone] Reschedule a follow up appointment with Norm Salt, dietician and CDCES, to help with ongoing weight loss

## 2022-09-16 NOTE — Patient Outreach (Signed)
Medicaid Managed Care   Nurse Care Manager Note  09/16/2022 Name:  [name] MRN:  [id number] DOB:  [birth year]  [name] is an 52 y.o. year old male who is a primary patient of [name] Lade, MD.  The [facility] Managed Care Coordination team was consulted for assistance with:    Chronic healthcare management needs, DM, chronic pain, CKD, osteoarthritis, HLD, CKD, GERD  [name] was given information about Medicaid Managed Care Coordination team services today. [name] Patient agreed to services and verbal consent obtained.  Engaged with patient by telephone for follow up visit in response to provider referral for case management and/or care coordination services.   Assessments/Interventions:  Review of past medical history, allergies, medications, health status, including review of consultants reports, laboratory and other test data, was performed as part of comprehensive evaluation and provision of chronic care management services.  SDOH (Social Determinants of Health) assessments and interventions performed: SDOH Interventions    Flowsheet Row Patient Outreach Telephone from 09/16/2022 in [place] POPULATION HEALTH DEPARTMENT Patient Outreach Telephone from 08/14/2022 in [place] POPULATION HEALTH DEPARTMENT Patient Outreach Telephone from 06/18/2022 in [place] POPULATION HEALTH DEPARTMENT Patient Outreach Telephone from 03/13/2022 in [facility] Care Coordination Patient Outreach Telephone from 02/09/2022 in [facility] Care Coordination Patient Outreach Telephone from 12/30/2021 in [facility] Coordination  SDOH Interventions        Food Insecurity Interventions -- Intervention Not Indicated -- -- Intervention Not Indicated --  Transportation Interventions Intervention Not Indicated -- -- -- -- --  Utilities Interventions Intervention Not Indicated -- -- -- Intervention Not Indicated --   Alcohol Usage Interventions -- -- -- Intervention Not Indicated (Score <7) -- --  Financial Strain Interventions -- Intervention Not Indicated -- -- -- Intervention Not Indicated  Physical Activity Interventions -- -- Intervention Not Indicated  [goes to gym 3 times a week] -- -- --  Stress Interventions -- -- Intervention Not Indicated -- -- --     Care Plan  No Known Allergies  Medications Reviewed Today     Reviewed by Danie Chandler, RN (Registered Nurse) on 09/16/22 at 1238  Med List Status: <None>   Medication Order Taking? Sig Documenting Provider Last Dose Status Informant  acetaminophen (TYLENOL) 500 MG tablet [id number] No Take 2 tablets (1,000 mg total) by mouth every 6 (six) hours as needed for mild pain or moderate pain. Jenne Pane, PA-C Taking Active Self  atorvastatin (LIPITOR) 40 MG tablet [id number] No Take 1 tablet (40 mg total) by mouth daily. [name] Lade, MD Taking Active   Azelastine HCl (ASTEPRO NA) [id number] No Place 1 spray into the nose daily as needed (allergies). [provider] Taking Active Self  blood glucose meter kit and supplies KIT [id number] No Test daily Dx  r73.03 Helane Rima, DO Taking Active Self  Blood Glucose Monitoring Suppl DEVI [id number]  1 each by Does not apply route daily. May substitute to any manufacturer covered by patient's insurance. [name] Lade, MD  Active   Blood Pressure Monitor KIT [id number] No 1 each by Does not apply route daily. Heather Roberts, NP Taking Active Self  eszopiclone (LUNESTA) 2 MG TABS tablet [id number]  Take 1 tablet (2 mg total) by mouth at bedtime as needed for sleep. Take immediately before bedtime [name] Lade, MD  Active   FARXIGA 5 MG TABS tablet [id number]  TAKE (1) TABLET BY MOUTH EACH MORNING.  [name] Lade, MD  Active   ferrous sulfate 325 (65 FE) MG tablet [id number] No Take 325 mg by mouth every other day. [provider] Taking Active Self  gabapentin (NEURONTIN) 300  MG capsule [id number] No TAKE (1) CAPSULE BY MOUTH AT BEDTIME. [name] Lade, MD Taking Active   Glucose Blood (BLOOD GLUCOSE TEST STRIPS) STRP [id number]  Once daily testing May substitute to any manufacturer covered by patient's insurance. [name] Lade, MD  Active   glucose blood test strip [id number] No Use as instructed. Can check up to 4 times daily. Heather Roberts, NP Taking Active Self  KERENDIA 10 MG TABS [id number] No Take 10 mg by mouth daily. [provider] Taking Active Self  Lancet Device MISC [id number]  Once daily dx e11.9 May substitute to any manufacturer covered by patient's insurance. [name] Lade, MD  Active   latanoprost (XALATAN) 0.005 % ophthalmic solution [id number] No Place 1 drop into both eyes at bedtime. [provider] Taking Active Self  loratadine (CLARITIN) 10 MG tablet [id number] No Take 10 mg by mouth daily. [provider] Taking Active Self  Melatonin 10 MG CAPS [id number] No Take 10 mg by mouth at bedtime. [provider] Taking Active Self  methocarbamol (ROBAXIN-750) 750 MG tablet [id number] No Take 1 tablet (750 mg total) by mouth every 8 (eight) hours as needed for muscle spasms (DO NOT TAKE WITH OTHER MUSCLE RELAXERS!). Levester Fresh M, PA-C Taking Active   Multiple Vitamin (MULTIVITAMIN) tablet [id number] No Take 1 tablet by mouth daily. One a day [provider] Taking Active Self  Oxycodone HCl 20 MG TABS [id number] No Take 20 mg by mouth 4 (four) times daily as needed (pain). [provider] Taking Active Self           Med Note Christena Flake Nov 27, 2021 12:05 PM)    OZEMPIC, 2 MG/DOSE, 8 MG/3ML SOPN [id number]  INJECT 2 MG INTO THE SKIN ONCE WEEKLY. [name] Lade, MD  Active   sildenafil (VIAGRA) 50 MG tablet [id number] No Take 1 tablet (50 mg total) by mouth daily as needed for erectile dysfunction. Donell Beers, FNP Taking Active Self  timolol (BETIMOL) 0.5 % ophthalmic solution  [id number] No Place 1 drop into both eyes in the morning. [provider] Taking Active Self           Patient Active Problem List   Diagnosis Date Noted   Insomnia 05/26/2022   Elevated alkaline phosphatase level 05/26/2022   S/P total right hip arthroplasty 03/03/2022   Annual visit for general adult medical examination with abnormal findings 02/17/2022   S/P total left hip arthroplasty 12/16/2021   Need for varicella vaccine 10/16/2021   OSA  09/11/2021   Pre-operative clearance 06/19/2021   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   Immunization due 02/06/2021   Iron deficiency anemia 12/30/2020   History of colonic polyps 12/30/2020   Unilateral primary osteoarthritis, right hip 08/13/2020   Unilateral primary osteoarthritis, left hip 08/13/2020   Muscle spasms of both lower extremities 05/23/2020   Need for immunization against influenza 03/06/2020   Primary osteoarthritis of both hips 07/10/2019   Bilateral primary osteoarthritis of hip 07/10/2019   Vitamin D deficiency 04/13/2019   Morbid obesity with body mass index (BMI) of 40.0 to 44.9 in adult 04/13/2019   Depressive disorder 04/13/2019   Body mass index (BMI) 45.0-49.9, adult 04/13/2019   Kidney  disease, chronic, stage III (GFR 30-59 ml/min) 07/20/2017   History of cerebrovascular accident 07/20/2017   Gastroesophageal reflux disease 07/20/2017   Gastric bypass status for obesity 02/11/2017   Benzodiazepine dependence 02/11/2017   Type 2 diabetes mellitus with diabetic nephropathy 08/03/2008   Hyperlipidemia 08/03/2008   Hypertension 08/03/2008   SLEEP APNEA 08/03/2008   Sleep apnea 08/03/2008   Conditions to be addressed/monitored per PCP order:  Chronic healthcare management needs, DM, chronic pain, CKD, osteoarthritis, HLD, CKD, GERD  Care Plan : RN Care Manager Plan Of Care  Updates made by Danie Chandler, RN since 09/16/2022 12:00 AM     Problem:  Chronic Disease Management and Care Coordination Needs for DM, HTN, HLD, CKD      Long-Range Goal: Development of Plan Of Care For Chronic Disease Management and Care Coordination Needs to Assist With Meeting Treatment Goals for DM, CKD, HTN, obesity, chronic hip pain   Start Date: 01/07/2021  Expected End Date: 12/16/2022  Priority: High  Note:   Current Barriers:  Knowledge Deficits related to plan of care for management of HTN, HLD, CKD, and DMII, chronic pain, obesity Chronic Disease Management support and education needs related to HTN, HLD, and DMII, chronic pain, obesity 09/16/22:  BP stable, BG stable-118-138.  No complaints today-has f/u with Nephrology and Pain Management soon for low back pain.  RNCM Clinical Goal(s):  Patient will verbalize understanding of plan for management of HTN, HLD, CKD and DMII take all medications exactly as prescribed and will call provider for medication related questions attend all scheduled medical appointments:  demonstrate ongoing adherence to prescribed treatment plan for HTN, HLD, CKD and DMII as evidenced by daily/ twice daily monitoring and recording of CBG,  adherence to ADA/ carb modified, fat modified, no added salt diet, continue 30-40 minutes of exercise on your NuStep 5-7 days/week, adherence to prescribed medication regimen including new diabetes medication,  contacting provider for new or worsened symptoms or questions related to HTN, DM or HLD, CKD continue to work with Medical illustrator and managed Medicaid pharmacist to address care management and care coordination needs related to HTN, HLD, and DMII , CKD  Interventions: Inter-disciplinary care team collaboration (see longitudinal plan of care) Evaluation of current treatment plan related to  self management and patient's adherence to plan as established by provider Reviewed appointments and insured patient has transportation to all appointments   Chronic Kidney Disease (Status: Goal on  Track (progressing): YES.) - all recent provider BP readings are meeting treatment targets, patient reports his nephrologist was pleased that his creatinine has stabilized and that his BP reading during office visit was good ,  patient states he checks his BP daily  and that readings are usually <130/<80 Last practice recorded BP readings:   03/13/22-BP=118/70 BP Readings from Last 3 Encounters:  08/01/21 107/76  06/26/21 110/76  06/19/21 116/76   06/18/22         115-130/60-70      Most recent eGFR/CrCl:  Lab Results  Component Value Date   NA 137 05/15/2021   K 4.1 05/15/2021   CREATININE 1.63 (H) 05/15/2021   EGFR 51 (L) 05/15/2021   GFRNONAA 37 (L) 01/10/2020   GLUCOSE 128 (H) 05/15/2021    Reviewed medications with patient and discussed importance of compliance   Assessed frequency of self monitored BP, reviewed home and provider office readings, reviewed treatment targets and reviewed lifestyle strategies   Reviewed addition of Farxiga to medication regimen for blood pressure, blood  sugar and weight control in addition to slowing progression of CKD and lowering risks of heart attack, stroke and cardiac death , assessed tolerance of Farxiga and reviewed major adverse side effects   Reviewed scheduled/upcoming provider appointments  Discussed plans with patient for ongoing care management follow up and provided patient with direct contact information for care management team    Diabetes:  (Status: Goal on track: YES.)- patient reports self monitored fasting CBGs are usually <130, checks 1-2 times a day.  Lab Results  Component Value Date   HGBA1C 7.0 (H) 05/15/2021       HGBA1C                                                      5.7                                     02/18/22 Assessed frequency of CBG testing and reviewed fasting target Reviewed medications, including recent addition of Farxiga to medication regimen with patient and discussed importance of good medication taking  behavior Discussed plans with patient for ongoing care management follow up and provided patient with direct contact information for care management team;               Health Maintenance (Status: Condition stable. Not addressed this visit.) - patient completed colonoscopy and endoscopy on 02/26/21 and was aware of pathology report- non cancerous Patient interviewed about adult health maintenance status   Pneumonia Vaccine-  received pneumococcal  vaccine on 05/15/21 Influenza Vaccine- states he received flu shot on 02/06/21 COVID vaccination   - pt states he never received  Shingles Vaccination- received first dose on 02/06/21, did not receive 2nd vaccination so will message provider prior to his appointment on May 11,2023-second shingles vaccine received 10/17/21 Regular eye checkups- says he sees his eye doctor, Dr Dione Booze,  every 6 months due to glaucoma  Advanced Directives- states he does not have and does not want information at this time  Dental Care- states his dentist is Dr Regino Schultze in [place] and he sees her once-twice yearly   Hyperlipidemia:  (Status: Condition stable. Not addressed this visit. Lab Results  Component Value Date   CHOL 162 05/15/2021   HDL 48 05/15/2021   LDLCALC 88 05/15/2021   TRIG 147 05/15/2021   CHOLHDL 6.1 (H) 12/20/2019     Medication review performed; medication list updated in electronic medical record.   Hypertension: (Status: Goal on Track (progressing): YES.)-patient states he checks his BP daily and that readings are usually <130/<80.  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 Last practice recorded BP readings:  BP Readings from Last 3 Encounters:  08/01/21 107/76  06/26/21 110/76  06/19/21 116/76  06/17/22          115-130/60-70 O4/24/24        119/78    Most recent eGFR/CrCl:  Lab Results  Component Value Date   NA 137 05/15/2021   K 4.1 05/15/2021   CREATININE 1.63 (H) 05/15/2021   EGFR  51 (L) 05/15/2021   GFRNONAA 37 (L) 01/10/2020   GLUCOSE 128 (H) 05/15/2021   Evaluation of current treatment plan related to hypertension self management and patient's adherence to  plan as established by provider;   Reviewed BP medications and assessed medication taking behavior ensuring he is taking Losartan that was recently added to his regimen Assessed frequency of self monitored blood press and readings, reviewed treatment targets Discussed plans with patient for ongoing care management follow up and provided patient with direct contact information for care management team; Reviewed scheduled/upcoming provider appointments 08/14/22:  BP medication discontinued  Pain:  (Status: Goal on track: Yes. Medications reviewed Reviewed provider established plan for pain management; Counseled on the importance of reporting any/all new or changed pain symptoms or management strategies to pain management provider;  Weight Loss Interventions:  (Status:  New goal.) Long Term Goal Provided verbal and/or written education to patient re: provider recommended life style modifications  Reviewed recommended dietary changes: avoid fad diets, make small/incremental dietary and exercise changes, eat at the table and avoid eating in front of the TV, plan management of cravings, monitor snacking and cravings in food diary Congratulated patient on good medication taking behavior, ongoing weight loss  and going to gym three times a week for exercise Reviewed Marcelline Deist benefit related to assisting with weight loss Encouraged patient to reschedule with Norm Salt for medical nutrition therapy  Patient Goals/Self-Care Activities: Take medications as prescribed   Attend all scheduled provider appointments Call pharmacy for medication refills 3-7 days in advance of running out of medications Perform all self care activities independently  Perform IADL's (shopping, preparing meals, housekeeping, managing finances)  independently Call provider office for new concerns or questions  Call and schedule dentist appointment with Dr. Regino Schultze for check up and cleaning as soon as possible , phone number [phone] Reschedule a follow up appointment with Norm Salt, dietician and [facility], to help with ongoing weight loss   Long-Range Goal: Establish Plan of Care for Chronic Disease Management Needs   Priority: High  Note:   Timeframe:  Long-Range Goal Priority:  High Start Date:     10/21/21                        Expected End Date:  ongoing                     Follow Up Date: 11/16/22   - schedule appointment for vaccines needed due to my age or health - schedule recommended health tests (blood work, mammogram, colonoscopy, pap test) - schedule and keep appointment for annual check-up    Why is this important?   Screening tests can find diseases early when they are easier to treat.  Your doctor or nurse will talk with you about which tests are important for you.  Getting shots for common diseases like the flu and shingles will help prevent them.  09/16/22:  patient has appts upcoming with Nephrology and Pain Management   Follow Up:  Patient agrees to Care Plan and Follow-up.  Plan: The Managed Medicaid care management team will reach out to the patient again over the next 30 business  days. and The  Patient has been provided with contact information for the Managed Medicaid care management team and has been advised to call with any health related questions or concerns.  Date/time of next scheduled RN care management/care coordination outreach: 11/16/22 at 0900

## 2022-09-29 DIAGNOSIS — M549 Dorsalgia, unspecified: Secondary | ICD-10-CM | POA: Diagnosis not present

## 2022-09-29 DIAGNOSIS — F32A Depression, unspecified: Secondary | ICD-10-CM | POA: Diagnosis not present

## 2022-09-29 DIAGNOSIS — Z6838 Body mass index (BMI) 38.0-38.9, adult: Secondary | ICD-10-CM | POA: Diagnosis not present

## 2022-09-29 DIAGNOSIS — Z79899 Other long term (current) drug therapy: Secondary | ICD-10-CM | POA: Diagnosis not present

## 2022-09-29 DIAGNOSIS — Z0130 Encounter for examination of blood pressure without abnormal findings: Secondary | ICD-10-CM | POA: Diagnosis not present

## 2022-09-29 DIAGNOSIS — E559 Vitamin D deficiency, unspecified: Secondary | ICD-10-CM | POA: Diagnosis not present

## 2022-09-29 DIAGNOSIS — M62830 Muscle spasm of back: Secondary | ICD-10-CM | POA: Diagnosis not present

## 2022-09-29 DIAGNOSIS — I10 Essential (primary) hypertension: Secondary | ICD-10-CM | POA: Diagnosis not present

## 2022-09-29 DIAGNOSIS — M160 Bilateral primary osteoarthritis of hip: Secondary | ICD-10-CM | POA: Diagnosis not present

## 2022-09-29 DIAGNOSIS — M5136 Other intervertebral disc degeneration, lumbar region: Secondary | ICD-10-CM | POA: Diagnosis not present

## 2022-09-30 ENCOUNTER — Other Ambulatory Visit: Payer: Self-pay | Admitting: Internal Medicine

## 2022-09-30 DIAGNOSIS — E1121 Type 2 diabetes mellitus with diabetic nephropathy: Secondary | ICD-10-CM

## 2022-10-01 ENCOUNTER — Other Ambulatory Visit: Payer: Self-pay | Admitting: Internal Medicine

## 2022-10-01 DIAGNOSIS — G4700 Insomnia, unspecified: Secondary | ICD-10-CM

## 2022-10-28 ENCOUNTER — Other Ambulatory Visit: Payer: Self-pay | Admitting: Internal Medicine

## 2022-10-28 DIAGNOSIS — G4700 Insomnia, unspecified: Secondary | ICD-10-CM

## 2022-10-28 DIAGNOSIS — E1121 Type 2 diabetes mellitus with diabetic nephropathy: Secondary | ICD-10-CM

## 2022-10-30 ENCOUNTER — Other Ambulatory Visit: Payer: Self-pay | Admitting: Internal Medicine

## 2022-10-30 DIAGNOSIS — N529 Male erectile dysfunction, unspecified: Secondary | ICD-10-CM

## 2022-10-30 DIAGNOSIS — G4700 Insomnia, unspecified: Secondary | ICD-10-CM

## 2022-10-30 MED ORDER — SILDENAFIL CITRATE 50 MG PO TABS: 50.0000 mg | ORAL_TABLET | Freq: Every day | ORAL | 0 refills | Status: DC | PRN

## 2022-10-30 MED ORDER — ESZOPICLONE 2 MG PO TABS: 2.0000 mg | ORAL_TABLET | Freq: Every day | ORAL | 0 refills | Status: DC

## 2022-11-03 DIAGNOSIS — Z0130 Encounter for examination of blood pressure without abnormal findings: Secondary | ICD-10-CM | POA: Diagnosis not present

## 2022-11-03 DIAGNOSIS — G8929 Other chronic pain: Secondary | ICD-10-CM | POA: Diagnosis not present

## 2022-11-03 DIAGNOSIS — E559 Vitamin D deficiency, unspecified: Secondary | ICD-10-CM | POA: Diagnosis not present

## 2022-11-03 DIAGNOSIS — I10 Essential (primary) hypertension: Secondary | ICD-10-CM | POA: Diagnosis not present

## 2022-11-03 DIAGNOSIS — K047 Periapical abscess without sinus: Secondary | ICD-10-CM | POA: Diagnosis not present

## 2022-11-03 DIAGNOSIS — M62830 Muscle spasm of back: Secondary | ICD-10-CM | POA: Diagnosis not present

## 2022-11-03 DIAGNOSIS — M5136 Other intervertebral disc degeneration, lumbar region: Secondary | ICD-10-CM | POA: Diagnosis not present

## 2022-11-03 DIAGNOSIS — Z79899 Other long term (current) drug therapy: Secondary | ICD-10-CM | POA: Diagnosis not present

## 2022-11-03 DIAGNOSIS — M160 Bilateral primary osteoarthritis of hip: Secondary | ICD-10-CM | POA: Diagnosis not present

## 2022-11-03 DIAGNOSIS — M549 Dorsalgia, unspecified: Secondary | ICD-10-CM | POA: Diagnosis not present

## 2022-11-03 DIAGNOSIS — Z6837 Body mass index (BMI) 37.0-37.9, adult: Secondary | ICD-10-CM | POA: Diagnosis not present

## 2022-11-03 DIAGNOSIS — F32A Depression, unspecified: Secondary | ICD-10-CM | POA: Diagnosis not present

## 2022-11-13 DIAGNOSIS — E213 Hyperparathyroidism, unspecified: Secondary | ICD-10-CM | POA: Diagnosis not present

## 2022-11-13 DIAGNOSIS — D631 Anemia in chronic kidney disease: Secondary | ICD-10-CM | POA: Diagnosis not present

## 2022-11-13 DIAGNOSIS — N1832 Chronic kidney disease, stage 3b: Secondary | ICD-10-CM | POA: Diagnosis not present

## 2022-11-13 DIAGNOSIS — I129 Hypertensive chronic kidney disease with stage 1 through stage 4 chronic kidney disease, or unspecified chronic kidney disease: Secondary | ICD-10-CM | POA: Diagnosis not present

## 2022-11-16 ENCOUNTER — Other Ambulatory Visit: Payer: Self-pay | Admitting: Internal Medicine

## 2022-11-16 ENCOUNTER — Other Ambulatory Visit: Payer: Medicaid Other | Admitting: Obstetrics and Gynecology

## 2022-11-16 DIAGNOSIS — E1121 Type 2 diabetes mellitus with diabetic nephropathy: Secondary | ICD-10-CM

## 2022-11-16 NOTE — Patient Outreach (Signed)
  Medicaid Managed Care   Unsuccessful Attempt Note   11/16/2022 Name: [name] MRN: [id number] DOB: [birth year]  Referred by: Billie Lade, MD Reason for referral : High Risk Managed Medicaid (Unsuccessful telephone outreach)  An unsuccessful telephone outreach was attempted today. The patient was referred to the case management team for assistance with care management and care coordination.    Follow Up Plan: The Managed Medicaid care management team will reach out to the patient again over the next 30 business  days. and The  Patient has been provided with contact information for the Managed Medicaid care management team and has been advised to call with any health related questions or concerns.   Kathi Der RN, BSN [place]  [facility] - Managed Medicaid High Risk [phone]

## 2022-11-16 NOTE — Patient Instructions (Signed)
Hi Mr. Ksiazek, I hope you are doing ok, sorry to have missed you today - as a part of your Medicaid benefit, you are eligible for care management and care coordination services at no cost or copay. I was unable to reach you by phone today but would be happy to help you with your health related needs. Please feel free to call me at [phone].  A member of the Managed Medicaid care management team will reach out to you again over the next 30 business  days.   Kathi Der RN, BSN [place]  [facility] [employer] - Managed Medicaid High Risk [phone]

## 2022-11-25 ENCOUNTER — Other Ambulatory Visit: Payer: Self-pay | Admitting: Internal Medicine

## 2022-11-25 DIAGNOSIS — E1121 Type 2 diabetes mellitus with diabetic nephropathy: Secondary | ICD-10-CM

## 2022-11-28 ENCOUNTER — Other Ambulatory Visit: Payer: Self-pay | Admitting: Internal Medicine

## 2022-11-28 DIAGNOSIS — G4700 Insomnia, unspecified: Secondary | ICD-10-CM

## 2022-12-02 ENCOUNTER — Other Ambulatory Visit: Payer: Medicaid Other | Admitting: Obstetrics and Gynecology

## 2022-12-02 NOTE — Patient Outreach (Signed)
  Medicaid Managed Care   Unsuccessful Attempt Note   12/02/2022 Name: [name] MRN: [id number] DOB: 01/30/1971  Referred by: Billie Lade, MD Reason for referral : High Risk Managed Medicaid (Unsuccessful telephone outreach )  A second unsuccessful telephone outreach was attempted today. The patient was referred to the case management team for assistance with care management and care coordination.    Follow Up Plan: The Managed Medicaid care management team will reach out to the patient again over the next 30 business  days. and The  Patient has been provided with contact information for the Managed Medicaid care management team and has been advised to call with any health related questions or concerns.   Kathi Der RN, BSN [place]  [facility] [employer] - Managed Medicaid High Risk [phone]

## 2022-12-02 NOTE — Patient Instructions (Signed)
Hi Mr. [name], I hope you are doing well-sorry we missed you today - as a part of your Medicaid benefit, you are eligible for care management and care coordination services at no cost or copay. I was unable to reach you by phone today but would be happy to help you with your health related needs. Please feel free to call me at [phone].   A member of the Managed Medicaid care management team will reach out to you again over the next 30 business days.   Kathi Der RN, BSN [name]  [facility] - Managed Medicaid High Risk [phone]

## 2022-12-09 ENCOUNTER — Telehealth: Payer: Self-pay

## 2022-12-09 NOTE — Telephone Encounter (Signed)
..   Medicaid Managed Care   Unsuccessful Outreach Note  12/09/2022 Name: [name] MRN: [id number] DOB: 09-12-1970  Referred by: Billie Lade, MD Reason for referral : Appointment   Third unsuccessful telephone outreach was attempted today. The patient was referred to the case management team for assistance with care management and care coordination. The patient's primary care provider has been notified of our unsuccessful attempts to make or maintain contact with the patient. The care management team is pleased to engage with this patient at any time in the future should he/she be interested in assistance from the care management team.   Follow Up Plan: We have been unable to make contact with the patient for follow up. The care management team is available to follow up with the patient after provider conversation with the patient regarding recommendation for care management engagement and subsequent re-referral to the care management team.   Weston Settle Care Guide  [facility] Managed  Care Guide [facility]  [phone]

## 2022-12-16 ENCOUNTER — Encounter: Payer: Self-pay | Admitting: Obstetrics and Gynecology

## 2022-12-16 ENCOUNTER — Other Ambulatory Visit: Payer: Medicaid Other | Admitting: Obstetrics and Gynecology

## 2022-12-16 NOTE — Patient Instructions (Signed)
Hi [name], glad you are doing so well-have a great afternoon!  [name] was given information about Medicaid Managed Care team care coordination services as a part of their Healthy [facility] Medicaid benefit. [name] verbally consented to engagement with the [facility] Managed Care team.   If you are experiencing a medical emergency, please call 911 or report to your local emergency department or urgent care.   If you have a non-emergency medical problem during routine business hours, please contact your provider's office and ask to speak with a nurse.   For questions related to your Healthy [facility] health plan, please call: [phone] or visit the homepage here: [email or web address]  If you would like to schedule transportation through your Healthy [facility] plan, please call the following number at least 2 days in advance of your appointment: [phone]  For information about your ride after you set it up, call Ride Assist at [phone]. Use this number to activate a Will Call pickup, or if your transportation is late for a scheduled pickup. Use this number, too, if you need to make a change or cancel a previously scheduled reservation.  If you need transportation services right away, call [phone]. The after-hours call center is staffed 24 hours to handle ride assistance and urgent reservation requests (including discharges) 365 days a year. Urgent trips include sick visits, hospital discharge requests and life-sustaining treatment.  Call the [facility] Line at [phone], at any time, 24 hours a day, 7 days a week. If you are in danger or need immediate medical attention call 911.  If you would like help to quit smoking, call 1-800-QUIT-NOW (843-430-2949) OR Espaol: 1-855-Djelo-Ya (3-220-254-2706) o para ms informacin haga clic aqu or Text READY to 237-628 to register via text  Mr. Barna - following are the  goals we discussed in your visit today:   Goals Addressed    Timeframe:  Long-Range Goal Priority:  High Start Date:     10/21/21                        Expected End Date:  ongoing                     Follow Up Date: 03/18/23   - schedule appointment for vaccines needed due to my age or health - schedule recommended health tests (blood work, mammogram, colonoscopy, pap test) - schedule and keep appointment for annual check-up    Why is this important?   Screening tests can find diseases early when they are easier to treat.  Your doctor or nurse will talk with you about which tests are important for you.  Getting shots for common diseases like the flu and shingles will help prevent them.  12/16/22:  regular follow up with pain management, Nephrology.  To schedule ENDO appt.  Patient verbalizes understanding of instructions and care plan provided today and agrees to view in MyChart. Active MyChart status and patient understanding of how to access instructions and care plan via MyChart confirmed with patient.     The Managed Medicaid care management team will reach out to the patient again over the next 90 business  days.  The  Patient  has been provided with contact information for the Managed Medicaid care management team and has been advised to call with any health related questions or concerns.   Kathi Der RN, BSN [place]  [employer], production - Managed  Medicaid High Risk [id number]   Following is a copy of your plan of care:  Care Plan : RN Care Manager Plan Of Care  Updates made by Danie Chandler, RN since 12/16/2022 12:00 AM     Problem: Chronic Disease Management and Care Coordination Needs for DM, HTN, HLD, CKD      Long-Range Goal: Development of Plan Of Care For Chronic Disease Management and Care Coordination Needs to Assist With Meeting Treatment Goals for DM, CKD, HTN, obesity, chronic hip pain   Start Date: 01/07/2021  Expected  End Date: 03/18/2023  Priority: High  Note:   Current Barriers:  Knowledge Deficits related to plan of care for management of HTN, HLD, CKD, and DMII, chronic pain, obesity Chronic Disease Management support and education needs related to HTN, HLD, and DMII, chronic pain, obesity 12/16/22: No complaints today-regular f/u with providers-needs to schedule  appt with ENDO.  Blood sugars stable-118 this morning.    RNCM Clinical Goal(s):  Patient will verbalize understanding of plan for management of HTN, HLD, CKD and DMII take all medications exactly as prescribed and will call provider for medication related questions attend all scheduled medical appointments:  demonstrate ongoing adherence to prescribed treatment plan for HTN, HLD, CKD and DMII as evidenced by daily/ twice daily monitoring and recording of CBG,  adherence to ADA/ carb modified, fat modified, no added salt diet, continue 30-40 minutes of exercise on your NuStep 5-7 days/week, adherence to prescribed medication regimen including new diabetes medication,  contacting provider for new or worsened symptoms or questions related to HTN, DM or HLD, CKD continue to work with Medical illustrator and managed Medicaid pharmacist to address care management and care coordination needs related to HTN, HLD, and DMII , CKD  Interventions: Inter-disciplinary care team collaboration (see longitudinal plan of care) Evaluation of current treatment plan related to  self management and patient's adherence to plan as established by provider Reviewed appointments and insured patient has transportation to all appointments   Chronic Kidney Disease (Status: Goal on Track (progressing): YES.) - all recent provider BP readings are meeting treatment targets, patient reports his nephrologist was pleased that his creatinine has stabilized and that his BP reading during office visit was good ,  patient states he checks his BP daily  and that readings are usually  <130/<80 Last practice recorded BP readings:   03/13/22-BP=118/70 BP Readings from Last 3 Encounters:  08/01/21 107/76  06/26/21 110/76  06/19/21 116/76   06/18/22         115-130/60-70       Most recent eGFR/CrCl:  Lab Results  Component Value Date   NA 137 05/15/2021   K 4.1 05/15/2021   CREATININE 1.63 (H) 05/15/2021   EGFR 51 (L) 05/15/2021   GFRNONAA 37 (L) 01/10/2020   GLUCOSE 128 (H) 05/15/2021    Reviewed medications with patient and discussed importance of compliance   Assessed frequency of self monitored BP, reviewed home and provider office readings, reviewed treatment targets and reviewed lifestyle strategies   Reviewed addition of Farxiga to medication regimen for blood pressure, blood sugar and weight control in addition to slowing progression of CKD and lowering risks of heart attack, stroke and cardiac death , assessed tolerance of Farxiga and reviewed major adverse side effects   Reviewed scheduled/upcoming provider appointments  Discussed plans with patient for ongoing care management follow up and provided patient with direct contact information for care management team    Diabetes:  (Status: Goal on  track: YES.)- patient reports self monitored fasting CBGs are usually <130, checks 1-2 times a day.  Lab Results  Component Value Date   HGBA1C 7.0 (H) 05/15/2021       HGBA1C                                                      5.7                                     02/18/22  Assessed frequency of CBG testing and reviewed fasting target Reviewed medications, including recent addition of Farxiga to medication regimen with patient and discussed importance of good medication taking behavior Discussed plans with patient for ongoing care management follow up and provided patient with direct contact information for care management team;               Health Maintenance (Status: Condition stable. Not addressed this visit.) - patient completed colonoscopy and endoscopy on  02/26/21 and was aware of pathology report- non cancerous Patient interviewed about adult health maintenance status   Pneumonia Vaccine-  received pneumococcal  vaccine on 05/15/21 Influenza Vaccine- states he received flu shot on 02/06/21 COVID vaccination   - pt states he never received  Shingles Vaccination- received first dose on 02/06/21, did not receive 2nd vaccination so will message provider prior to his appointment on May 11,2023-second shingles vaccine received 10/17/21 Regular eye checkups- says he sees his eye doctor, Dr Dione Booze,  every 6 months due to glaucoma  Advanced Directives- states he does not have and does not want information at this time  Dental Care- states his dentist is Dr Regino Schultze in [place] and he sees her once-twice yearly   Hyperlipidemia:  (Status: Condition stable. Not addressed this visit. Lab Results  Component Value Date   CHOL 162 05/15/2021   HDL 48 05/15/2021   LDLCALC 88 05/15/2021   TRIG 147 05/15/2021   CHOLHDL 6.1 (H) 12/20/2019     Medication review performed; medication list updated in electronic medical record.   Hypertension: (Status: Goal on Track (progressing): YES.)-patient states he checks his BP daily and that readings are usually <130/<80.  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 Last practice recorded BP readings:  BP Readings from Last 3 Encounters:  08/01/21 107/76  06/26/21 110/76  06/19/21 116/76  06/17/22          115-130/60-70 O4/24/24        119/78    Most recent eGFR/CrCl:  Lab Results  Component Value Date   NA 137 05/15/2021   K 4.1 05/15/2021   CREATININE 1.63 (H) 05/15/2021   EGFR 51 (L) 05/15/2021   GFRNONAA 37 (L) 01/10/2020   GLUCOSE 128 (H) 05/15/2021   Evaluation of current treatment plan related to hypertension self management and patient's adherence to plan as established by provider;   Reviewed BP medications and assessed medication taking behavior ensuring  he is taking Losartan that was recently added to his regimen Assessed frequency of self monitored blood press and readings, reviewed treatment targets Discussed plans with patient for ongoing care management follow up and provided patient with direct contact information for care management team; Reviewed scheduled/upcoming provider appointments 08/14/22:  BP  medication discontinued  Pain:  (Status: Goal on track: Yes. Medications reviewed Reviewed provider established plan for pain management; Counseled on the importance of reporting any/all new or changed pain symptoms or management strategies to pain management provider;  Weight Loss Interventions:  (Status:  New goal.) Long Term Goal Provided verbal and/or written education to patient re: provider recommended life style modifications  Reviewed recommended dietary changes: avoid fad diets, make small/incremental dietary and exercise changes, eat at the table and avoid eating in front of the TV, plan management of cravings, monitor snacking and cravings in food diary Congratulated patient on good medication taking behavior, ongoing weight loss  and going to gym three times a week for exercise Reviewed Marcelline Deist benefit related to assisting with weight loss Encouraged patient to reschedule with Norm Salt for medical nutrition therapy  Patient Goals/Self-Care Activities: Take medications as prescribed   Attend all scheduled provider appointments Call pharmacy for medication refills 3-7 days in advance of running out of medications Perform all self care activities independently  Perform IADL's (shopping, preparing meals, housekeeping, managing finances) independently Call provider office for new concerns or questions  Call and schedule dentist appointment with Dr. Regino Schultze for check up and cleaning as soon as possible , phone number [phone] Reschedule a follow up appointment with Norm Salt, dietician and CDCES, to help with ongoing  weight loss Patient to schedule ENDO f/u.

## 2022-12-16 NOTE — Patient Outreach (Signed)
Medicaid Managed Care   Nurse Care Manager Note  12/16/2022 Name:  [name] MRN:  [id number] DOB:  [birth year]  [name] is an 52 y.o. year old male who is a primary patient of Billie Lade, MD.  The [facility] Managed Care Coordination team was consulted for assistance with:    Chronic healthcare management needs, DM, chronic pain, CKD, osteoarthritis, HLD, GERD  Mr. Steinberger was given information about Medicaid Managed Care Coordination team services today. [name] Patient agreed to services and verbal consent obtained.  Engaged with patient by telephone for follow up visit in response to provider referral for case management and/or care coordination services.   Assessments/Interventions:  Review of past medical history, allergies, medications, health status, including review of consultants reports, laboratory and other test data, was performed as part of comprehensive evaluation and provision of chronic care management services.  SDOH (Social Determinants of Health) assessments and interventions performed: SDOH Interventions    Flowsheet Row Patient Outreach Telephone from 12/16/2022 in [place] POPULATION HEALTH DEPARTMENT Patient Outreach Telephone from 09/16/2022 in [place] POPULATION HEALTH DEPARTMENT Patient Outreach Telephone from 08/14/2022 in [place] POPULATION HEALTH DEPARTMENT Patient Outreach Telephone from 06/18/2022 in [place] POPULATION HEALTH DEPARTMENT Patient Outreach Telephone from 03/13/2022 in [facility] Care Coordination Patient Outreach Telephone from 02/09/2022 in [facility] Care Coordination  SDOH Interventions        Food Insecurity Interventions -- -- Intervention Not Indicated -- -- Intervention Not Indicated  Housing Interventions Intervention Not Indicated -- -- -- -- --  Transportation Interventions -- Intervention Not Indicated -- -- -- --  Utilities Interventions -- Intervention Not  Indicated -- -- -- Intervention Not Indicated  Alcohol Usage Interventions -- -- -- -- Intervention Not Indicated (Score <7) --  Financial Strain Interventions -- -- Intervention Not Indicated -- -- --  Physical Activity Interventions -- -- -- Intervention Not Indicated  [goes to gym 3 times a week] -- --  Stress Interventions -- -- -- Intervention Not Indicated -- --  Health Literacy Interventions Intervention Not Indicated -- -- -- -- --     Care Plan  No Known Allergies  Medications Reviewed Today     Reviewed by Danie Chandler, RN (Registered Nurse) on 12/16/22 at 1342  Med List Status: <None>   Medication Order Taking? Sig Documenting Provider Last Dose Status Informant  acetaminophen (TYLENOL) 500 MG tablet [id number] No Take 2 tablets (1,000 mg total) by mouth every 6 (six) hours as needed for mild pain or moderate pain. Jenne Pane, PA-C Taking Active Self  atorvastatin (LIPITOR) 40 MG tablet [id number] No Take 1 tablet (40 mg total) by mouth daily. Billie Lade, MD Taking Active   Azelastine HCl (ASTEPRO NA) [id number] No Place 1 spray into the nose daily as needed (allergies). [provider] Taking Active Self  blood glucose meter kit and supplies KIT [id number] No Test daily Dx  r73.03 Helane Rima, DO Taking Active Self  Blood Glucose Monitoring Suppl DEVI [id number]  1 each by Does not apply route daily. May substitute to any manufacturer covered by patient's insurance. Billie Lade, MD  Active   Blood Pressure Monitor KIT [id number] No 1 each by Does not apply route daily. Heather Roberts, NP Taking Active Self  eszopiclone (LUNESTA) 2 MG TABS tablet [id number]  TAKE (1) TABLET BY MOUTH AT BEDTIME AS NEEDED FOR SLEEP. TAKE IMMEDIATELY BEFORE BEDTIME Anabel Halon, MD  Active  FARXIGA 5 MG TABS tablet [id number]  TAKE (1) TABLET BY MOUTH EACH MORNING. Billie Lade, MD  Active   ferrous sulfate 325 (65 FE) MG tablet [id number] No Take 325 mg by mouth every  other day. [provider] Taking Active Self  gabapentin (NEURONTIN) 300 MG capsule [id number] No TAKE (1) CAPSULE BY MOUTH AT BEDTIME. Billie Lade, MD Taking Active   Glucose Blood (BLOOD GLUCOSE TEST STRIPS) STRP [id number]  Once daily testing May substitute to any manufacturer covered by patient's insurance. Billie Lade, MD  Active   glucose blood test strip [id number] No Use as instructed. Can check up to 4 times daily. Heather Roberts, NP Taking Active Self  KERENDIA 10 MG TABS [id number] No Take 10 mg by mouth daily. [provider] Taking Active Self  Lancet Device MISC [id number]  Once daily dx e11.9 May substitute to any manufacturer covered by patient's insurance. Billie Lade, MD  Active   latanoprost (XALATAN) 0.005 % ophthalmic solution [id number] No Place 1 drop into both eyes at bedtime. [provider] Taking Active Self  loratadine (CLARITIN) 10 MG tablet [id number] No Take 10 mg by mouth daily. [provider] Taking Active Self  Melatonin 10 MG CAPS [id number] No Take 10 mg by mouth at bedtime. [provider] Taking Active Self  methocarbamol (ROBAXIN-750) 750 MG tablet [id number] No Take 1 tablet (750 mg total) by mouth every 8 (eight) hours as needed for muscle spasms (DO NOT TAKE WITH OTHER MUSCLE RELAXERS!). Levester Fresh M, PA-C Taking Active   Multiple Vitamin (MULTIVITAMIN) tablet [id number] No Take 1 tablet by mouth daily. One a day [provider] Taking Active Self  Oxycodone HCl 20 MG TABS [id number] No Take 20 mg by mouth 4 (four) times daily as needed (pain). [provider] Taking Active Self           Med Note Lenoria Farrier   Thu Nov 27, 2021 12:05 PM)    OZEMPIC, 2 MG/DOSE, 8 MG/3ML SOPN [id number]  INJECT 2 MG INTO THE SKIN ONCE WEEKLY. Billie Lade, MD  Active   sildenafil (VIAGRA) 50 MG tablet [id number]  Take 1 tablet (50 mg total) by mouth daily as needed for erectile dysfunction. Billie Lade, MD  Active   timolol (BETIMOL) 0.5 % ophthalmic solution [id number] No Place 1 drop into both eyes in the morning. [provider] Taking Active Self           Patient Active Problem List   Diagnosis Date Noted   Insomnia 05/26/2022   Elevated alkaline phosphatase level 05/26/2022   S/P total right hip arthroplasty 03/03/2022   Annual visit for general adult medical examination with abnormal findings 02/17/2022   S/P total left hip arthroplasty 12/16/2021   Need for varicella vaccine 10/16/2021   OSA  09/11/2021   Pre-operative clearance 06/19/2021   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   Immunization due 02/06/2021   Iron deficiency anemia 12/30/2020   History of colonic polyps 12/30/2020   Unilateral primary osteoarthritis, right hip 08/13/2020   Unilateral primary osteoarthritis, left hip 08/13/2020   Muscle spasms of both lower extremities 05/23/2020   Need for immunization against influenza 03/06/2020   Primary osteoarthritis of both hips 07/10/2019   Bilateral primary osteoarthritis of hip 07/10/2019   Vitamin D deficiency 04/13/2019   Morbid obesity with body mass index (BMI) of 40.0 to 44.9 in adult [facility]) 04/13/2019  Depressive disorder 04/13/2019   Body mass index (BMI) 45.0-49.9, adult (HCC) 04/13/2019   Kidney disease, chronic, stage III (GFR 30-59 ml/min) (HCC) 07/20/2017   History of cerebrovascular accident 07/20/2017   Gastroesophageal reflux disease 07/20/2017   Gastric bypass status for obesity 02/11/2017   Benzodiazepine dependence (HCC) 02/11/2017   Type 2 diabetes mellitus with diabetic nephropathy (HCC) 08/03/2008   Hyperlipidemia 08/03/2008   Hypertension 08/03/2008   SLEEP APNEA 08/03/2008   Sleep apnea 08/03/2008   Conditions to be addressed/monitored per PCP order:  Chronic healthcare management needs, DM, chronic pain, CKD, osteoarthritis, HLD, GERD  Care Plan : RN Care Manager  Plan Of Care  Updates made by Danie Chandler, RN since 12/16/2022 12:00 AM     Problem: Chronic Disease Management and Care Coordination Needs for DM, HTN, HLD, CKD      Long-Range Goal: Development of Plan Of Care For Chronic Disease Management and Care Coordination Needs to Assist With Meeting Treatment Goals for DM, CKD, HTN, obesity, chronic hip pain   Start Date: 01/07/2021  Expected End Date: 03/18/2023  Priority: High  Note:   Current Barriers:  Knowledge Deficits related to plan of care for management of HTN, HLD, CKD, and DMII, chronic pain, obesity Chronic Disease Management support and education needs related to HTN, HLD, and DMII, chronic pain, obesity 12/16/22: No complaints today-regular f/u with providers-needs to schedule  appt with ENDO.  Blood sugars stable-118 this morning.    RNCM Clinical Goal(s):  Patient will verbalize understanding of plan for management of HTN, HLD, CKD and DMII take all medications exactly as prescribed and will call provider for medication related questions attend all scheduled medical appointments:  demonstrate ongoing adherence to prescribed treatment plan for HTN, HLD, CKD and DMII as evidenced by daily/ twice daily monitoring and recording of CBG,  adherence to ADA/ carb modified, fat modified, no added salt diet, continue 30-40 minutes of exercise on your NuStep 5-7 days/week, adherence to prescribed medication regimen including new diabetes medication,  contacting provider for new or worsened symptoms or questions related to HTN, DM or HLD, CKD continue to work with Medical illustrator and managed Medicaid pharmacist to address care management and care coordination needs related to HTN, HLD, and DMII , CKD  Interventions: Inter-disciplinary care team collaboration (see longitudinal plan of care) Evaluation of current treatment plan related to  self management and patient's adherence to plan as established by provider Reviewed appointments and  insured patient has transportation to all appointments   Chronic Kidney Disease (Status: Goal on Track (progressing): YES.) - all recent provider BP readings are meeting treatment targets, patient reports his nephrologist was pleased that his creatinine has stabilized and that his BP reading during office visit was good ,  patient states he checks his BP daily  and that readings are usually <130/<80 Last practice recorded BP readings:   03/13/22-BP=118/70 BP Readings from Last 3 Encounters:  08/01/21 107/76  06/26/21 110/76  06/19/21 116/76   06/18/22         115-130/60-70       Most recent eGFR/CrCl:  Lab Results  Component Value Date   NA 137 05/15/2021   K 4.1 05/15/2021   CREATININE 1.63 (H) 05/15/2021   EGFR 51 (L) 05/15/2021   GFRNONAA 37 (L) 01/10/2020   GLUCOSE 128 (H) 05/15/2021    Reviewed medications with patient and discussed importance of compliance   Assessed frequency of self monitored BP, reviewed home and provider office readings, reviewed  treatment targets and reviewed lifestyle strategies   Reviewed addition of Farxiga to medication regimen for blood pressure, blood sugar and weight control in addition to slowing progression of CKD and lowering risks of heart attack, stroke and cardiac death , assessed tolerance of Farxiga and reviewed major adverse side effects   Reviewed scheduled/upcoming provider appointments  Discussed plans with patient for ongoing care management follow up and provided patient with direct contact information for care management team    Diabetes:  (Status: Goal on track: YES.)- patient reports self monitored fasting CBGs are usually <130, checks 1-2 times a day.  Lab Results  Component Value Date   HGBA1C 7.0 (H) 05/15/2021       HGBA1C                                                      5.7                                     02/18/22  Assessed frequency of CBG testing and reviewed fasting target Reviewed medications, including recent  addition of Farxiga to medication regimen with patient and discussed importance of good medication taking behavior Discussed plans with patient for ongoing care management follow up and provided patient with direct contact information for care management team;               Health Maintenance (Status: Condition stable. Not addressed this visit.) - patient completed colonoscopy and endoscopy on 02/26/21 and was aware of pathology report- non cancerous Patient interviewed about adult health maintenance status   Pneumonia Vaccine-  received pneumococcal  vaccine on 05/15/21 Influenza Vaccine- states he received flu shot on 02/06/21 COVID vaccination   - pt states he never received  Shingles Vaccination- received first dose on 02/06/21, did not receive 2nd vaccination so will message provider prior to his appointment on May 11,2023-second shingles vaccine received 10/17/21 Regular eye checkups- says he sees his eye doctor, Dr Dione Booze,  every 6 months due to glaucoma  Advanced Directives- states he does not have and does not want information at this time  Dental Care- states his dentist is Dr Regino Schultze in [place] and he sees her once-twice yearly   Hyperlipidemia:  (Status: Condition stable. Not addressed this visit. Lab Results  Component Value Date   CHOL 162 05/15/2021   HDL 48 05/15/2021   LDLCALC 88 05/15/2021   TRIG 147 05/15/2021   CHOLHDL 6.1 (H) 12/20/2019     Medication review performed; medication list updated in electronic medical record.   Hypertension: (Status: Goal on Track (progressing): YES.)-patient states he checks his BP daily and that readings are usually <130/<80.  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 Last practice recorded BP readings:  BP Readings from Last 3 Encounters:  08/01/21 107/76  06/26/21 110/76  06/19/21 116/76  06/17/22          115-130/60-70 O4/24/24        119/78    Most recent eGFR/CrCl:  Lab Results   Component Value Date   NA 137 05/15/2021   K 4.1 05/15/2021   CREATININE 1.63 (H) 05/15/2021   EGFR 51 (L) 05/15/2021   GFRNONAA 37 (L) 01/10/2020  GLUCOSE 128 (H) 05/15/2021   Evaluation of current treatment plan related to hypertension self management and patient's adherence to plan as established by provider;   Reviewed BP medications and assessed medication taking behavior ensuring he is taking Losartan that was recently added to his regimen Assessed frequency of self monitored blood press and readings, reviewed treatment targets Discussed plans with patient for ongoing care management follow up and provided patient with direct contact information for care management team; Reviewed scheduled/upcoming provider appointments 08/14/22:  BP medication discontinued  Pain:  (Status: Goal on track: Yes. Medications reviewed Reviewed provider established plan for pain management; Counseled on the importance of reporting any/all new or changed pain symptoms or management strategies to pain management provider;  Weight Loss Interventions:  (Status:  New goal.) Long Term Goal Provided verbal and/or written education to patient re: provider recommended life style modifications  Reviewed recommended dietary changes: avoid fad diets, make small/incremental dietary and exercise changes, eat at the table and avoid eating in front of the TV, plan management of cravings, monitor snacking and cravings in food diary Congratulated patient on good medication taking behavior, ongoing weight loss  and going to gym three times a week for exercise Reviewed Marcelline Deist benefit related to assisting with weight loss Encouraged patient to reschedule with Norm Salt for medical nutrition therapy  Patient Goals/Self-Care Activities: Take medications as prescribed   Attend all scheduled provider appointments Call pharmacy for medication refills 3-7 days in advance of running out of medications Perform all self  care activities independently  Perform IADL's (shopping, preparing meals, housekeeping, managing finances) independently Call provider office for new concerns or questions  Call and schedule dentist appointment with Dr. Regino Schultze for check up and cleaning as soon as possible , phone number [phone] Reschedule a follow up appointment with Norm Salt, dietician and [facility], to help with ongoing weight loss Patient to schedule ENDO f/u.   Long-Range Goal: Establish Plan of Care for Chronic Disease Management Needs   Priority: High  Note:   Timeframe:  Long-Range Goal Priority:  High Start Date:     10/21/21                        Expected End Date:  ongoing                     Follow Up Date: 03/18/23   - schedule appointment for vaccines needed due to my age or health - schedule recommended health tests (blood work, mammogram, colonoscopy, pap test) - schedule and keep appointment for annual check-up    Why is this important?   Screening tests can find diseases early when they are easier to treat.  Your doctor or nurse will talk with you about which tests are important for you.  Getting shots for common diseases like the flu and shingles will help prevent them.  12/16/22:  regular follow up with pain management, Nephrology.  To schedule ENDO appt.   Follow Up:  Patient agrees to Care Plan and Follow-up.  Plan: The Managed Medicaid care management team will reach out to the patient again over the next 90 business  days. and The  Patient has been provided with contact information for the Managed Medicaid care management team and has been advised to call with any health related questions or concerns.  Date/time of next scheduled RN care management/care coordination outreach: 03/18/23 at 1230.

## 2022-12-17 ENCOUNTER — Ambulatory Visit: Payer: Medicaid Other | Admitting: Obstetrics and Gynecology

## 2022-12-23 ENCOUNTER — Other Ambulatory Visit: Payer: Self-pay | Admitting: Internal Medicine

## 2022-12-23 DIAGNOSIS — E1121 Type 2 diabetes mellitus with diabetic nephropathy: Secondary | ICD-10-CM

## 2022-12-28 ENCOUNTER — Other Ambulatory Visit: Payer: Self-pay | Admitting: Internal Medicine

## 2022-12-28 DIAGNOSIS — G4700 Insomnia, unspecified: Secondary | ICD-10-CM

## 2022-12-30 DIAGNOSIS — M5136 Other intervertebral disc degeneration, lumbar region: Secondary | ICD-10-CM | POA: Diagnosis not present

## 2022-12-30 DIAGNOSIS — M62830 Muscle spasm of back: Secondary | ICD-10-CM | POA: Diagnosis not present

## 2022-12-30 DIAGNOSIS — I10 Essential (primary) hypertension: Secondary | ICD-10-CM | POA: Diagnosis not present

## 2022-12-30 DIAGNOSIS — Z79899 Other long term (current) drug therapy: Secondary | ICD-10-CM | POA: Diagnosis not present

## 2022-12-30 DIAGNOSIS — M160 Bilateral primary osteoarthritis of hip: Secondary | ICD-10-CM | POA: Diagnosis not present

## 2022-12-30 DIAGNOSIS — F32A Depression, unspecified: Secondary | ICD-10-CM | POA: Diagnosis not present

## 2022-12-30 DIAGNOSIS — E6609 Other obesity due to excess calories: Secondary | ICD-10-CM | POA: Diagnosis not present

## 2022-12-30 DIAGNOSIS — Z6837 Body mass index (BMI) 37.0-37.9, adult: Secondary | ICD-10-CM | POA: Diagnosis not present

## 2022-12-30 DIAGNOSIS — E559 Vitamin D deficiency, unspecified: Secondary | ICD-10-CM | POA: Diagnosis not present

## 2023-01-01 DIAGNOSIS — Z79899 Other long term (current) drug therapy: Secondary | ICD-10-CM | POA: Diagnosis not present

## 2023-01-05 ENCOUNTER — Ambulatory Visit: Payer: Medicaid Other | Admitting: Internal Medicine

## 2023-01-05 ENCOUNTER — Encounter: Payer: Self-pay | Admitting: Internal Medicine

## 2023-01-05 VITALS — BP 109/72 | HR 81 | Ht 69.0 in | Wt 259.0 lb

## 2023-01-05 DIAGNOSIS — I10 Essential (primary) hypertension: Secondary | ICD-10-CM | POA: Diagnosis not present

## 2023-01-05 DIAGNOSIS — E1121 Type 2 diabetes mellitus with diabetic nephropathy: Secondary | ICD-10-CM | POA: Diagnosis not present

## 2023-01-05 DIAGNOSIS — E782 Mixed hyperlipidemia: Secondary | ICD-10-CM

## 2023-01-05 DIAGNOSIS — Z794 Long term (current) use of insulin: Secondary | ICD-10-CM

## 2023-01-05 DIAGNOSIS — N529 Male erectile dysfunction, unspecified: Secondary | ICD-10-CM | POA: Diagnosis not present

## 2023-01-05 DIAGNOSIS — Z7984 Long term (current) use of oral hypoglycemic drugs: Secondary | ICD-10-CM

## 2023-01-05 DIAGNOSIS — G4700 Insomnia, unspecified: Secondary | ICD-10-CM

## 2023-01-05 DIAGNOSIS — N1832 Chronic kidney disease, stage 3b: Secondary | ICD-10-CM | POA: Diagnosis not present

## 2023-01-05 DIAGNOSIS — E119 Type 2 diabetes mellitus without complications: Secondary | ICD-10-CM

## 2023-01-05 LAB — HEMOGLOBIN A1C: Hemoglobin A1C: 6.3

## 2023-01-05 MED ORDER — SILDENAFIL CITRATE 50 MG PO TABS
50.0000 mg | ORAL_TABLET | Freq: Every day | ORAL | 0 refills | Status: AC | PRN
Start: 2023-01-05 — End: ?

## 2023-01-05 NOTE — Assessment & Plan Note (Signed)
Symptoms are adequately controlled with sildenafil.  Refill provided today.

## 2023-01-05 NOTE — Patient Instructions (Signed)
It was a pleasure to see you today.  Thank you for giving [name] the opportunity to be involved in your care.  Below is a brief recap of your visit and next steps.  We will plan to see you again in 3 months.  Summary No medication changes today Focus on diet changes to keep diabetes under control Check cholesterol panel today Follow up in 3 months for annual exam

## 2023-01-05 NOTE — Assessment & Plan Note (Signed)
Followed by nephrology (Dr. Wolfgang Phoenix).  Labs from March show stable renal function.  Follow-up is scheduled for next month.

## 2023-01-05 NOTE — Progress Notes (Signed)
Established Patient Office Visit  Subjective   Patient ID: [name], male    DOB: 1970-09-25  Age: 52 y.o. MRN: [id number]  Chief Complaint  Patient presents with   Diabetes    Follow up   [name] returns to care today for routine follow-up.  He was last evaluated by me on 05/20/22 for routine follow-up.  No medication changes were made and repeat labs were ordered.  In the interim he has been seen by nephrology for follow-up.  There have otherwise been no acute interval events. [name] reports feeling well today.  He is asymptomatic currently and has no acute concerns or discuss.  Past Medical History:  Diagnosis Date   ALLERGY 08/03/2008   Qualifier: Diagnosis of  By: Elinor Parkinson     Anemia    Arthritis    "hips" (01/15/2015)   Blood transfusion without reported diagnosis    Cellulitis 07/25/2018   Chronic back pain greater than 3 months duration 02/11/2017   Chronic lower back pain    Colon polyps    COLONIC POLYPS, ADENOMATOUS 08/03/2008   Qualifier: Diagnosis of  By: Elinor Parkinson     CVA 08/03/2008   Qualifier: History of  By: Elinor Parkinson     Depression    denies   Diabetes mellitus    diet controlled- no meds since wt loss   Diabetes mellitus (HCC) 07/10/2019   Dysrhythmia    s/p surgery bariatric- cardioversion   Erectile dysfunction 01/26/2018   GERD (gastroesophageal reflux disease)    HIATAL HERNIA 08/03/2008   Qualifier: Diagnosis of  By: Elinor Parkinson     Hip pain    History of gout    Hyperlipidemia    Hypertension    Joint pain    Narcotic dependence (HCC) 02/11/2017   Neuromuscular disorder (HCC)    PONV (postoperative nausea and vomiting)    Prediabetes 03/29/2017   Sleep apnea    no longer have to use cpap since wt loss   Stage 3 chronic kidney disease (HCC) 01/26/2018   Stroke (HCC) 2006   denies residual on 01/15/2015   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 (HCC) 08/01/2018   Past Surgical History:   Procedure Laterality Date   BARIATRIC SURGERY  2010   in [place]   BIOPSY  02/26/2021   Procedure: BIOPSY;  Surgeon: Dolores Frame, MD;  [facility]: [facility];  Service: Gastroenterology;;   CARDIOVERSION     COLONOSCOPY N/A 08/02/2014   Procedure: COLONOSCOPY;  Surgeon: Malissa Hippo, MD;  [facility]: [facility];  Service: Endoscopy;  Laterality: N/A;  210 - moved to 3/10 @ 11:55 - Ann notified pt   COLONOSCOPY WITH PROPOFOL N/A 02/26/2021   Procedure: COLONOSCOPY WITH PROPOFOL;  Surgeon: Dolores Frame, MD;  [facility];  Service: Gastroenterology;  Laterality: N/A;  10:00   ESOPHAGOGASTRODUODENOSCOPY     ESOPHAGOGASTRODUODENOSCOPY (EGD) WITH PROPOFOL N/A 02/26/2021   Procedure: ESOPHAGOGASTRODUODENOSCOPY (EGD) WITH PROPOFOL;  Surgeon: Dolores Frame, MD;  [facility];  Service: Gastroenterology;  Laterality: N/A;   HERNIA REPAIR     LIPOSUCTION     PANNICULECTOMY  01/14/2015   PANNICULECTOMY N/A 01/14/2015   Procedure:  TOTAL PANNICULECTOMY WTH LIPOSUCTION OF SIDES;  Surgeon: Louisa Second, MD;  [facility]: [facility];  Service: Plastics;  Laterality: N/A;   POLYPECTOMY  02/26/2021   Procedure: POLYPECTOMY INTESTINAL;  Surgeon: Dolores Frame, MD;  [facility];  Service:  Gastroenterology;;   TONSILLECTOMY     TOTAL HIP ARTHROPLASTY Left 12/16/2021   Procedure: TOTAL HIP ARTHROPLASTY ANTERIOR APPROACH;  Surgeon: Sheral Apley, MD;  Location: [facility];  Service: Orthopedics;  Laterality: Left;   TOTAL HIP ARTHROPLASTY Right 03/03/2022   Procedure: TOTAL HIP ARTHROPLASTY ANTERIOR APPROACH;  Surgeon: Sheral Apley, MD;  Location: [facility];  Service: Orthopedics;  Laterality: Right;   VENTRAL HERNIA REPAIR  01/14/2015   VENTRAL HERNIA REPAIR N/A 01/14/2015   Procedure: HERNIA REPAIR VENTRAL ADULT AND MUSCLE REPAIR;  Surgeon: Louisa Second, MD;  [facility]: [facility];  Service: Plastics;  Laterality: N/A;   Social  History   Tobacco Use   Smoking status: Never   Smokeless tobacco: Never  Vaping Use   Vaping status: Never Used  Substance Use Topics   Alcohol use: No   Drug use: No   Family History  Problem Relation Age of Onset   COPD Mother    Depression Mother    Hyperlipidemia Mother    Hypertension Mother    Heart disease Mother    Thyroid disease Mother    Anxiety disorder Mother    Diabetes Father    Pulmonary embolism Father 68       blood clot   Hyperlipidemia Father    Sudden death Father    Obesity Father    Eating disorder Father    Hypertension Brother    No Known Allergies  Review of Systems  Constitutional:  Negative for chills and fever.  HENT:  Negative for sore throat.   Respiratory:  Negative for cough and shortness of breath.   Cardiovascular:  Negative for chest pain, palpitations and leg swelling.  Gastrointestinal:  Negative for abdominal pain, blood in stool, constipation, diarrhea, nausea and vomiting.  Genitourinary:  Negative for dysuria and hematuria.  Musculoskeletal:  Negative for myalgias.  Skin:  Negative for itching and rash.  Neurological:  Negative for dizziness and headaches.  Psychiatric/Behavioral:  Negative for depression and suicidal ideas.      Objective:     BP 109/72   Pulse 81   Ht 5\' 9"  (1.753 m)   Wt 259 lb (117.5 kg)   SpO2 97%   BMI 38.25 kg/m  BP Readings from Last 3 Encounters:  01/05/23 109/72  05/20/22 119/81  03/04/22 116/69   Physical Exam Vitals reviewed.  Constitutional:      General: He is not in acute distress.    Appearance: Normal appearance. He is obese. He is not ill-appearing.  HENT:     Head: Normocephalic and atraumatic.     Right Ear: External ear normal.     Left Ear: External ear normal.     Nose: Nose normal. No congestion or rhinorrhea.     Mouth/Throat:     Mouth: Mucous membranes are moist.     Pharynx: Oropharynx is clear.  Eyes:     General: No scleral icterus.    Extraocular  Movements: Extraocular movements intact.     Conjunctiva/sclera: Conjunctivae normal.     Pupils: Pupils are equal, round, and reactive to light.  Cardiovascular:     Rate and Rhythm: Normal rate and regular rhythm.     Pulses: Normal pulses.     Heart sounds: Normal heart sounds. No murmur heard. Pulmonary:     Effort: Pulmonary effort is normal.     Breath sounds: Normal breath sounds. No wheezing, rhonchi or rales.  Abdominal:     General: Abdomen is flat. Bowel  sounds are normal. There is no distension.     Palpations: Abdomen is soft.     Tenderness: There is no abdominal tenderness.  Musculoskeletal:        General: No swelling or deformity. Normal range of motion.     Cervical back: Normal range of motion.  Skin:    General: Skin is warm and dry.     Capillary Refill: Capillary refill takes less than 2 seconds.  Neurological:     General: No focal deficit present.     Mental Status: He is alert and oriented to person, place, and time.     Motor: No weakness.  Psychiatric:        Mood and Affect: Mood normal.        Behavior: Behavior normal.        Thought Content: Thought content normal.   Diabetic foot exam was performed.  No deformities or other abnormal visual findings.  Posterior tibialis and dorsalis pulse intact bilaterally.  Intact to touch and monofilament testing bilaterally.   Last CBC Lab Results  Component Value Date   WBC 8.8 02/18/2022   HGB 14.8 02/18/2022   HCT 44.7 02/18/2022   MCV 94.7 02/18/2022   MCH 31.4 02/18/2022   RDW 13.4 02/18/2022   PLT 214 02/18/2022   Last metabolic panel Lab Results  Component Value Date   GLUCOSE 116 (H) 05/20/2022   NA 140 05/20/2022   K 4.1 05/20/2022   CL 101 05/20/2022   CO2 23 05/20/2022   BUN 21 05/20/2022   CREATININE 1.82 (H) 05/20/2022   EGFR 44 (L) 05/20/2022   CALCIUM 9.7 05/20/2022   PROT 6.8 05/20/2022   ALBUMIN 4.1 05/20/2022   LABGLOB 2.7 05/20/2022   AGRATIO 1.5 05/20/2022   BILITOT 0.6  05/20/2022   ALKPHOS 124 (H) 05/20/2022   AST 26 05/20/2022   ALT 19 05/20/2022   ANIONGAP 8 02/18/2022   Last lipids Lab Results  Component Value Date   CHOL 133 10/17/2021   HDL 42 10/17/2021   LDLCALC 62 10/17/2021   TRIG 174 (H) 10/17/2021   CHOLHDL 3.2 10/17/2021   Last hemoglobin A1c Lab Results  Component Value Date   HGBA1C 5.7 (H) 02/18/2022   Last thyroid functions Lab Results  Component Value Date   TSH 1.81 04/13/2019   T3TOTAL 86 02/15/2019   Last vitamin D Lab Results  Component Value Date   VD25OH 57.0 05/20/2022   Last vitamin B12 and Folate Lab Results  Component Value Date   VITAMINB12 815 04/13/2019     Assessment & Plan:   Problem List Items Addressed This Visit       Hypertension    BP remains well-controlled off medication.  No changes are indicated today.      Type 2 diabetes mellitus with diabetic nephropathy (HCC)    POC A1c today is 6.3.  He is currently prescribed Ozempic 2 mg weekly and Farxiga 5 mg daily.  Diabetes remains controlled, however we reviewed that his A1c has increased from 5.7 previously.  He will focus on dietary changes aimed at maintaining control of his diabetes. -No medication changes today -Diabetic foot exam completed today -Ophthalmology follow-up is scheduled for November.      Kidney disease, chronic, stage III (GFR 30-59 ml/min) (HCC)    Followed by nephrology (Dr. Wolfgang Phoenix).  Labs from March show stable renal function.  Follow-up is scheduled for next month.      Hyperlipidemia    Lipid panel  updated in May 2023.  Total cholesterol 143 and LDL 62.  He is currently prescribed atorvastatin 40 mg daily. -Repeat lipid panel ordered today.      Erectile dysfunction    Symptoms are adequately controlled with sildenafil.  Refill provided today.      Insomnia    Lunesta remains effective in treating insomnia.  No medication changes have been made today.      Return in about 3 months (around 04/07/2023)  for CPE.   Billie Lade, MD

## 2023-01-05 NOTE — Assessment & Plan Note (Signed)
BP remains well-controlled off medication.  No changes are indicated today.

## 2023-01-05 NOTE — Assessment & Plan Note (Signed)
POC A1c today is 6.3.  He is currently prescribed Ozempic 2 mg weekly and Farxiga 5 mg daily.  Diabetes remains controlled, however we reviewed that his A1c has increased from 5.7 previously.  He will focus on dietary changes aimed at maintaining control of his diabetes. -No medication changes today -Diabetic foot exam completed today -Ophthalmology follow-up is scheduled for November.

## 2023-01-05 NOTE — Assessment & Plan Note (Signed)
Lipid panel updated in May 2023.  Total cholesterol 143 and LDL 62.  He is currently prescribed atorvastatin 40 mg daily. -Repeat lipid panel ordered today.

## 2023-01-05 NOTE — Assessment & Plan Note (Signed)
Lunesta remains effective in treating insomnia.  No medication changes have been made today.

## 2023-01-06 ENCOUNTER — Other Ambulatory Visit: Payer: Self-pay | Admitting: Internal Medicine

## 2023-01-06 DIAGNOSIS — E782 Mixed hyperlipidemia: Secondary | ICD-10-CM

## 2023-01-06 MED ORDER — ATORVASTATIN CALCIUM 80 MG PO TABS
80.0000 mg | ORAL_TABLET | Freq: Every day | ORAL | 1 refills | Status: DC
Start: 2023-01-06 — End: 2023-07-05

## 2023-01-23 ENCOUNTER — Other Ambulatory Visit: Payer: Self-pay | Admitting: Internal Medicine

## 2023-01-23 DIAGNOSIS — E1121 Type 2 diabetes mellitus with diabetic nephropathy: Secondary | ICD-10-CM

## 2023-01-26 ENCOUNTER — Other Ambulatory Visit: Payer: Self-pay | Admitting: Internal Medicine

## 2023-01-26 DIAGNOSIS — G4700 Insomnia, unspecified: Secondary | ICD-10-CM

## 2023-01-29 DIAGNOSIS — F32A Depression, unspecified: Secondary | ICD-10-CM | POA: Diagnosis not present

## 2023-01-29 DIAGNOSIS — M5136 Other intervertebral disc degeneration, lumbar region: Secondary | ICD-10-CM | POA: Diagnosis not present

## 2023-01-29 DIAGNOSIS — N183 Chronic kidney disease, stage 3 unspecified: Secondary | ICD-10-CM | POA: Diagnosis not present

## 2023-01-29 DIAGNOSIS — M160 Bilateral primary osteoarthritis of hip: Secondary | ICD-10-CM | POA: Diagnosis not present

## 2023-01-29 DIAGNOSIS — I10 Essential (primary) hypertension: Secondary | ICD-10-CM | POA: Diagnosis not present

## 2023-01-29 DIAGNOSIS — M62830 Muscle spasm of back: Secondary | ICD-10-CM | POA: Diagnosis not present

## 2023-01-29 DIAGNOSIS — E6609 Other obesity due to excess calories: Secondary | ICD-10-CM | POA: Diagnosis not present

## 2023-01-29 DIAGNOSIS — E1122 Type 2 diabetes mellitus with diabetic chronic kidney disease: Secondary | ICD-10-CM | POA: Diagnosis not present

## 2023-01-29 DIAGNOSIS — Z79899 Other long term (current) drug therapy: Secondary | ICD-10-CM | POA: Diagnosis not present

## 2023-01-29 DIAGNOSIS — E559 Vitamin D deficiency, unspecified: Secondary | ICD-10-CM | POA: Diagnosis not present

## 2023-01-29 DIAGNOSIS — Z6837 Body mass index (BMI) 37.0-37.9, adult: Secondary | ICD-10-CM | POA: Diagnosis not present

## 2023-02-01 DIAGNOSIS — R809 Proteinuria, unspecified: Secondary | ICD-10-CM | POA: Diagnosis not present

## 2023-02-01 DIAGNOSIS — E1129 Type 2 diabetes mellitus with other diabetic kidney complication: Secondary | ICD-10-CM | POA: Diagnosis not present

## 2023-02-01 DIAGNOSIS — E1122 Type 2 diabetes mellitus with diabetic chronic kidney disease: Secondary | ICD-10-CM | POA: Diagnosis not present

## 2023-02-01 DIAGNOSIS — N1831 Chronic kidney disease, stage 3a: Secondary | ICD-10-CM | POA: Diagnosis not present

## 2023-02-01 DIAGNOSIS — E559 Vitamin D deficiency, unspecified: Secondary | ICD-10-CM | POA: Diagnosis not present

## 2023-02-08 DIAGNOSIS — R809 Proteinuria, unspecified: Secondary | ICD-10-CM | POA: Diagnosis not present

## 2023-02-08 DIAGNOSIS — E1129 Type 2 diabetes mellitus with other diabetic kidney complication: Secondary | ICD-10-CM | POA: Diagnosis not present

## 2023-02-08 DIAGNOSIS — E1122 Type 2 diabetes mellitus with diabetic chronic kidney disease: Secondary | ICD-10-CM | POA: Diagnosis not present

## 2023-02-08 DIAGNOSIS — N1831 Chronic kidney disease, stage 3a: Secondary | ICD-10-CM | POA: Diagnosis not present

## 2023-02-15 ENCOUNTER — Other Ambulatory Visit: Payer: Self-pay | Admitting: Internal Medicine

## 2023-02-15 DIAGNOSIS — E1121 Type 2 diabetes mellitus with diabetic nephropathy: Secondary | ICD-10-CM

## 2023-02-17 ENCOUNTER — Other Ambulatory Visit: Payer: Self-pay | Admitting: Internal Medicine

## 2023-02-17 DIAGNOSIS — E1121 Type 2 diabetes mellitus with diabetic nephropathy: Secondary | ICD-10-CM

## 2023-02-25 ENCOUNTER — Other Ambulatory Visit: Payer: Self-pay | Admitting: Internal Medicine

## 2023-02-25 DIAGNOSIS — G4700 Insomnia, unspecified: Secondary | ICD-10-CM

## 2023-02-26 ENCOUNTER — Ambulatory Visit: Payer: Medicaid Other | Attending: Cardiology | Admitting: Cardiology

## 2023-02-26 ENCOUNTER — Encounter: Payer: Self-pay | Admitting: Cardiology

## 2023-02-26 ENCOUNTER — Encounter: Payer: Self-pay | Admitting: Nephrology

## 2023-02-26 VITALS — BP 136/70 | HR 85 | Ht 69.0 in | Wt 253.0 lb

## 2023-02-26 DIAGNOSIS — R9439 Abnormal result of other cardiovascular function study: Secondary | ICD-10-CM

## 2023-02-26 DIAGNOSIS — R9431 Abnormal electrocardiogram [ECG] [EKG]: Secondary | ICD-10-CM

## 2023-02-26 DIAGNOSIS — E782 Mixed hyperlipidemia: Secondary | ICD-10-CM

## 2023-02-26 NOTE — Progress Notes (Signed)
Clinical Summary [name] is a 52 y.o.male seen today for follow up of the following medical problems.    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      2.Abnormal stress test - 06/2021 nuclear stress: inferoseptal ischemia, overall low risk  - 2017 CT abdomen mild atherosclerosis  - no recent chest pains    3. Hyperlipidemia - 12/2022 TC 146 TG 183 HDL 37 LDL 78 - pcp recentlty increased atorvastatin to 80mg  daiily - compliant with meds   4. CKD - on farxiga.  - followed by Dr Wolfgang Phoenix Past Medical History:  Diagnosis Date   ALLERGY 08/03/2008   Qualifier: Diagnosis of  By: Elinor Parkinson     Anemia    Arthritis    "hips" (01/15/2015)   Blood transfusion without reported diagnosis    Cellulitis 07/25/2018   Chronic back pain greater than 3 months duration 02/11/2017   Chronic lower back pain    Colon polyps    COLONIC POLYPS, ADENOMATOUS 08/03/2008   Qualifier: Diagnosis of  By: Elinor Parkinson     CVA 08/03/2008   Qualifier: History of  By: Elinor Parkinson     Depression    denies   Diabetes mellitus    diet controlled- no meds since wt loss   Diabetes mellitus (HCC) 07/10/2019   Dysrhythmia    s/p surgery bariatric- cardioversion   Erectile dysfunction 01/26/2018   GERD (gastroesophageal reflux disease)    HIATAL HERNIA 08/03/2008   Qualifier: Diagnosis of  By: Elinor Parkinson     Hip pain    History of gout    Hyperlipidemia    Hypertension    Joint pain    Narcotic dependence (HCC) 02/11/2017   Neuromuscular disorder (HCC)    PONV (postoperative nausea and vomiting)    Prediabetes 03/29/2017   Sleep apnea    no longer have to use cpap since wt loss   Stage 3 chronic kidney disease (HCC) 01/26/2018   Stroke (HCC) 2006   denies residual on 01/15/2015   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  (HCC) 08/01/2018     No Known Allergies   Current Outpatient Medications  Medication Sig Dispense Refill   atorvastatin (LIPITOR) 80 MG tablet Take 1 tablet (80 mg total) by mouth daily. 90 tablet 1   blood glucose meter kit and supplies KIT Test daily Dx  r73.03 1 each 0   Blood Glucose Monitoring Suppl DEVI 1 each by Does not apply route daily. May substitute to any manufacturer covered by patient's insurance. 1 each 0   Blood Pressure Monitor KIT 1 each by Does not apply route daily. 1 kit 0   eszopiclone (LUNESTA) 2 MG TABS tablet TAKE (1) TABLET BY MOUTH AT BEDTIME AS NEEDED FOR SLEEP. TAKE IMMEDIATELY BEFORE BEDTIME 30 tablet 0   FARXIGA 5 MG TABS tablet TAKE (1) TABLET BY MOUTH EACH MORNING. 90 tablet 0   Glucose Blood (BLOOD GLUCOSE TEST STRIPS) STRP Once daily testing May substitute to any manufacturer covered by patient's insurance. 100 strip 2   glucose blood test strip Use as instructed. Can check up to 4 times daily. 100 each 3   KERENDIA 10 MG TABS Take 10 mg by mouth daily.     Lancet Device MISC Once daily dx e11.9 May  substitute to any manufacturer covered by patient's insurance. 1 each 0   latanoprost (XALATAN) 0.005 % ophthalmic solution Place 1 drop into both eyes at bedtime.     methocarbamol (ROBAXIN-750) 750 MG tablet Take 1 tablet (750 mg total) by mouth every 8 (eight) hours as needed for muscle spasms (DO NOT TAKE WITH OTHER MUSCLE RELAXERS!). 20 tablet 0   Oxycodone HCl 20 MG TABS Take 20 mg by mouth 4 (four) times daily as needed (pain).     OZEMPIC, 2 MG/DOSE, 8 MG/3ML SOPN INJECT 2 MG INTO THE SKIN ONCE WEEKLY. 3 mL 0   sildenafil (VIAGRA) 50 MG tablet Take 1 tablet (50 mg total) by mouth daily as needed for erectile dysfunction. 20 tablet 0   timolol (BETIMOL) 0.5 % ophthalmic solution Place 1 drop into both eyes in the morning.     No current facility-administered medications for this visit.     Past Surgical History:  Procedure Laterality Date    BARIATRIC SURGERY  2010   in [place]   BIOPSY  02/26/2021   Procedure: BIOPSY;  Surgeon: Dolores Frame, MD;  [facility];  Service: Gastroenterology;;   CARDIOVERSION     COLONOSCOPY N/A 08/02/2014   Procedure: COLONOSCOPY;  Surgeon: Malissa Hippo, MD;  [facility];  Service: Endoscopy;  Laterality: N/A;  210 - moved to 3/10 @ 11:55 - Ann notified pt   COLONOSCOPY WITH PROPOFOL N/A 02/26/2021   Procedure: COLONOSCOPY WITH PROPOFOL;  Surgeon: Dolores Frame, MD;  [facility];  Service: Gastroenterology;  Laterality: N/A;  10:00   ESOPHAGOGASTRODUODENOSCOPY     ESOPHAGOGASTRODUODENOSCOPY (EGD) WITH PROPOFOL N/A 02/26/2021   Procedure: ESOPHAGOGASTRODUODENOSCOPY (EGD) WITH PROPOFOL;  Surgeon: Dolores Frame, MD;  [facility];  Service: Gastroenterology;  Laterality: N/A;   HERNIA REPAIR     LIPOSUCTION     PANNICULECTOMY  01/14/2015   PANNICULECTOMY N/A 01/14/2015   Procedure:  TOTAL PANNICULECTOMY WTH LIPOSUCTION OF SIDES;  Surgeon: Louisa Second, MD;  [facility];  Service: Plastics;  Laterality: N/A;   POLYPECTOMY  02/26/2021   Procedure: POLYPECTOMY INTESTINAL;  Surgeon: Dolores Frame, MD;  [facility];  Service: Gastroenterology;;   TONSILLECTOMY     TOTAL HIP ARTHROPLASTY Left 12/16/2021   Procedure: TOTAL HIP ARTHROPLASTY ANTERIOR APPROACH;  Surgeon: Sheral Apley, MD;  Location: [facility];  Service: Orthopedics;  Laterality: Left;   TOTAL HIP ARTHROPLASTY Right 03/03/2022   Procedure: TOTAL HIP ARTHROPLASTY ANTERIOR APPROACH;  Surgeon: Sheral Apley, MD;  [facility];  Service: Orthopedics;  Laterality: Right;   VENTRAL HERNIA REPAIR  01/14/2015   VENTRAL HERNIA REPAIR N/A 01/14/2015   Procedure: HERNIA REPAIR VENTRAL ADULT AND MUSCLE REPAIR;  Surgeon: Louisa Second, MD;  Location[facility];  Service: Plastics;  Laterality: N/A;     No Known Allergies    Family  History  Problem Relation Age of Onset   COPD Mother    Depression Mother    Hyperlipidemia Mother    Hypertension Mother    Heart disease Mother    Thyroid disease Mother    Anxiety disorder Mother    Diabetes Father    Pulmonary embolism Father 81       blood clot   Hyperlipidemia Father    Sudden death Father    Obesity Father    Eating disorder Father    Hypertension Brother      Social History [name] reports  that he has never smoked. He has never used smokeless tobacco. [name] reports no history of alcohol use.   Review of Systems CONSTITUTIONAL: No weight loss, fever, chills, weakness or fatigue.  HEENT: Eyes: No visual loss, blurred vision, double vision or yellow sclerae.No hearing loss, sneezing, congestion, runny nose or sore throat.  SKIN: No rash or itching.  CARDIOVASCULAR: per hpi RESPIRATORY: No shortness of breath, cough or sputum.  GASTROINTESTINAL: No anorexia, nausea, vomiting or diarrhea. No abdominal pain or blood.  GENITOURINARY: No burning on urination, no polyuria NEUROLOGICAL: No headache, dizziness, syncope, paralysis, ataxia, numbness or tingling in the extremities. No change in bowel or bladder control.  MUSCULOSKELETAL: No muscle, back pain, joint pain or stiffness.  LYMPHATICS: No enlarged nodes. No history of splenectomy.  PSYCHIATRIC: No history of depression or anxiety.  ENDOCRINOLOGIC: No reports of sweating, cold or heat intolerance. No polyuria or polydipsia.  Marland Kitchen   Physical Examination Today's Vitals   02/26/23 1056  BP: 136/70  Pulse: 85  SpO2: 97%  Weight: 253 lb (114.8 kg)  Height: 5\' 9"  (1.753 m)   Body mass index is 37.36 kg/m.  Gen: resting comfortably, no acute distress HEENT: no scleral icterus, pupils equal round and reactive, no palptable cervical adenopathy,  CV: RRR, no m/rg ,no jvd Resp: Clear to auscultation bilaterally GI: abdomen is soft, non-tender, non-distended, normal bowel sounds, no  hepatosplenomegaly MSK: extremities are warm, no edema.  Skin: warm, no rash Neuro:  no focal deficits Psych: appropriate affect   Diagnostic Studies  06/2021 nuclear stress  Findings are consistent with ischemia. The study is low risk.   No ST deviation was noted.   LV perfusion is abnormal. There is evidence of ischemia. There is no evidence of infarction. Defect 1: There is a medium defect with mild reduction in uptake present in the inferoseptal location(s) that is reversible.   Left ventricular function is normal. End diastolic cavity size is normal. End systolic cavity size is normal.   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 Abnormal stress test - low risk stress test, suggets small area of ischemia overall low risk - no recent symptoms, continue risk factor modification.    3. Hyperlipidemia - continue atorvastatin 80mg  daily, labs followed by pcp      Antoine Poche, M.D

## 2023-02-26 NOTE — Patient Instructions (Signed)
Medication Instructions:  Your physician recommends that you continue on your current medications as directed. Please refer to the Current Medication list given to you today.  *If you need a refill on your cardiac medications before your next appointment, please call your pharmacy*   Lab Work: None  If you have labs (blood work) drawn today and your tests are completely normal, you will receive your results only by: MyChart Message (if you have MyChart) OR A paper copy in the mail If you have any lab test that is abnormal or we need to change your treatment, we will call you to review the results.   Testing/Procedures: None   Follow-Up: At [facility], you and your health needs are our priority.  As part of our continuing mission to provide you with exceptional heart care, we have created designated Provider Care Teams.  These Care Teams include your primary Cardiologist (physician) and Advanced Practice Providers (APPs -  Physician Assistants and Nurse Practitioners) who all work together to provide you with the care you need, when you need it.  We recommend signing up for the patient portal called "MyChart".  Sign up information is provided on this After Visit Summary.  MyChart is used to connect with patients for Virtual Visits (Telemedicine).  Patients are able to view lab/test results, encounter notes, upcoming appointments, etc.  Non-urgent messages can be sent to your provider as well.   To learn more about what you can do with MyChart, go to [email or web address].    Your next appointment:   1 year(s)  Provider:   You may see Dina Rich, MD or one of the following Advanced Practice Providers on your designated Care Team:   Randall An, PA-C  Jacolyn Reedy, [place]     Other Instructions

## 2023-03-01 DIAGNOSIS — M160 Bilateral primary osteoarthritis of hip: Secondary | ICD-10-CM | POA: Diagnosis not present

## 2023-03-01 DIAGNOSIS — E1121 Type 2 diabetes mellitus with diabetic nephropathy: Secondary | ICD-10-CM | POA: Diagnosis not present

## 2023-03-01 DIAGNOSIS — M62830 Muscle spasm of back: Secondary | ICD-10-CM | POA: Diagnosis not present

## 2023-03-01 DIAGNOSIS — M51369 Other intervertebral disc degeneration, lumbar region without mention of lumbar back pain or lower extremity pain: Secondary | ICD-10-CM | POA: Diagnosis not present

## 2023-03-01 DIAGNOSIS — E559 Vitamin D deficiency, unspecified: Secondary | ICD-10-CM | POA: Diagnosis not present

## 2023-03-01 DIAGNOSIS — E6609 Other obesity due to excess calories: Secondary | ICD-10-CM | POA: Diagnosis not present

## 2023-03-01 DIAGNOSIS — Z79899 Other long term (current) drug therapy: Secondary | ICD-10-CM | POA: Diagnosis not present

## 2023-03-01 DIAGNOSIS — E1122 Type 2 diabetes mellitus with diabetic chronic kidney disease: Secondary | ICD-10-CM | POA: Diagnosis not present

## 2023-03-01 DIAGNOSIS — N183 Chronic kidney disease, stage 3 unspecified: Secondary | ICD-10-CM | POA: Diagnosis not present

## 2023-03-01 DIAGNOSIS — I10 Essential (primary) hypertension: Secondary | ICD-10-CM | POA: Diagnosis not present

## 2023-03-01 DIAGNOSIS — F32A Depression, unspecified: Secondary | ICD-10-CM | POA: Diagnosis not present

## 2023-03-01 DIAGNOSIS — Z6837 Body mass index (BMI) 37.0-37.9, adult: Secondary | ICD-10-CM | POA: Diagnosis not present

## 2023-03-05 ENCOUNTER — Ambulatory Visit: Payer: Medicaid Other

## 2023-03-17 ENCOUNTER — Other Ambulatory Visit: Payer: Self-pay | Admitting: Internal Medicine

## 2023-03-17 DIAGNOSIS — E1121 Type 2 diabetes mellitus with diabetic nephropathy: Secondary | ICD-10-CM

## 2023-03-18 ENCOUNTER — Other Ambulatory Visit: Payer: Self-pay | Admitting: Obstetrics and Gynecology

## 2023-03-18 NOTE — Patient Instructions (Signed)
Hi Mr. Vallee, I am glad you are doing well-thank you for the updates and have a nice afternoon!  Mr. Ricketts was given information about Medicaid Managed Care team care coordination services as a part of their Healthy [facility] Medicaid benefit. [name] verbally consented to engagement with the [facility] Managed Care team.   If you are experiencing a medical emergency, please call 911 or report to your local emergency department or urgent care.   If you have a non-emergency medical problem during routine business hours, please contact your provider's office and ask to speak with a nurse.   For questions related to your Healthy [facility] health plan, please call: [phone] or visit the homepage here: [email or web address]  If you would like to schedule transportation through your Healthy [facility] plan, please call the following number at least 2 days in advance of your appointment: [phone]  For information about your ride after you set it up, call Ride Assist at [phone]. Use this number to activate a Will Call pickup, or if your transportation is late for a scheduled pickup. Use this number, too, if you need to make a change or cancel a previously scheduled reservation.  If you need transportation services right away, call [phone]. The after-hours call center is staffed 24 hours to handle ride assistance and urgent reservation requests (including discharges) 365 days a year. Urgent trips include sick visits, hospital discharge requests and life-sustaining treatment.  Call the [facility] Line at [phone], at any time, 24 hours a day, 7 days a week. If you are in danger or need immediate medical attention call 911.  If you would like help to quit smoking, call 1-800-QUIT-NOW ((514) 040-7931) OR Espaol: 1-855-Djelo-Ya (4-742-595-6387) o para ms informacin haga clic aqu or Text READY to 564-332 to register via  text  Mr. Hume - following are the goals we discussed in your visit today:   Goals Addressed    Timeframe:  Long-Range Goal Priority:  High Start Date:     10/21/21                        Expected End Date:  ongoing                     Follow Up Date: 06/18/23   - schedule appointment for vaccines needed due to my age or health - schedule recommended health tests (blood work, mammogram, colonoscopy, pap test) - schedule and keep appointment for annual check-up    Why is this important?   Screening tests can find diseases early when they are easier to treat.  Your doctor or nurse will talk with you about which tests are important for you.  Getting shots for common diseases like the flu and shingles will help prevent them.  03/18/23:  ENDO appt next month.  PCP and eye appts in Nov.  Evaluated by CARDS 10/4.  Patient verbalizes understanding of instructions and care plan provided today and agrees to view in MyChart. Active MyChart status and patient understanding of how to access instructions and care plan via MyChart confirmed with patient.     The Managed Medicaid care management team will reach out to the patient again over the next 90 business  days.  The  Patient  has been provided with contact information for the Managed Medicaid care management team and has been advised to call with any health related questions or concerns.   Kathi Der RN, BSN    [facility] Care Management Coordinator - Managed [place] High Risk [phone]   Following is a copy of your plan of care:  Care Plan : RN Care Manager Plan Of Care  Updates made by Danie Chandler, RN since 03/18/2023 12:00 AM     Problem: Chronic Disease Management and Care Coordination Needs for DM, HTN, HLD, CKD      Long-Range Goal: Development of Plan Of Care For Chronic Disease Management and Care Coordination Needs to Assist With Meeting Treatment Goals for DM, CKD, HTN, obesity, chronic hip  pain   Start Date: 01/07/2021  Expected End Date: 06/18/2023  Priority: High  Note:   Current Barriers:  Knowledge Deficits related to plan of care for management of HTN, HLD, CKD, and DMII, chronic pain, obesity Chronic Disease Management support and education needs related to HTN, HLD, and DMII, chronic pain, obesity 03/18/23:  Patient states he has ENDO appt next month.  Blood sugar stable-120 today.  No complaints today.  Has upcoming appts with PCP and eye provider.  Recent CARDS appt 10/4  RNCM Clinical Goal(s):  Patient will verbalize understanding of plan for management of HTN, HLD, CKD and DMII take all medications exactly as prescribed and will call provider for medication related questions attend all scheduled medical appointments:  demonstrate ongoing adherence to prescribed treatment plan for HTN, HLD, CKD and DMII as evidenced by daily/ twice daily monitoring and recording of CBG,  adherence to ADA/ carb modified, fat modified, no added salt diet, continue 30-40 minutes of exercise on your NuStep 5-7 days/week, adherence to prescribed medication regimen including new diabetes medication,  contacting provider for new or worsened symptoms or questions related to HTN, DM or HLD, CKD continue to work with Medical illustrator and managed Medicaid pharmacist to address care management and care coordination needs related to HTN, HLD, and DMII , CKD  Interventions: Inter-disciplinary care team collaboration (see longitudinal plan of care) Evaluation of current treatment plan related to  self management and patient's adherence to plan as established by provider Reviewed appointments and insured patient has transportation to all appointments   Chronic Kidney Disease (Status: Goal on Track (progressing): YES.) - all recent provider BP readings are meeting treatment targets, patient reports his nephrologist was pleased that his creatinine has stabilized and that his BP reading during office visit  was good ,  patient states he checks his BP daily  and that readings are usually <130/<80 Last practice recorded BP readings:   03/13/22-BP=118/70 BP Readings from Last 3 Encounters:  08/01/21 107/76  06/26/21 110/76      06/18/22         115-130/60-70       Most recent eGFR/CrCl:  Lab Results  Component Value Date   NA 137 05/15/2021   K 4.1 05/15/2021   CREATININE 1.63 (H) 05/15/2021   EGFR 51 (L) 05/15/2021   GFRNONAA 37 (L) 01/10/2020   GLUCOSE 128 (H) 05/15/2021    Reviewed medications with patient and discussed importance of compliance   Assessed frequency of self monitored BP, reviewed home and provider office readings, reviewed treatment targets and reviewed lifestyle strategies   Reviewed addition of Farxiga to medication regimen for blood pressure, blood sugar and weight control in addition to slowing progression of CKD and lowering risks of heart attack, stroke and cardiac death , assessed tolerance of Farxiga and reviewed major adverse side effects   Reviewed scheduled/upcoming provider appointments  Discussed plans with patient for ongoing  care management follow up and provided patient with direct contact information for care management team    Diabetes:  (Status: Goal on track: YES.)- patient reports self monitored fasting CBGs are usually <130, checks 1-2 times a day.  Lab Results  Component Value Date   HGBA1C 7.0 (H) 05/15/2021       HGBA1C                                                      5.7                                     02/18/22      HGBA1C                                                      6.3                                     01/05/23 Assessed frequency of CBG testing and reviewed fasting target Reviewed medications, including recent addition of Farxiga to medication regimen with patient and discussed importance of good medication taking behavior Discussed plans with patient for ongoing care management follow up and provided patient with direct contact  information for care management team;               Health Maintenance (Status: Condition stable. Not addressed this visit.) - patient completed colonoscopy and endoscopy on 02/26/21 and was aware of pathology report- non cancerous Patient interviewed about adult health maintenance status   Regular eye checkups- says he sees his eye doctor, Dr Dione Booze,  every 6 months due to glaucoma  Advanced Directives- states he does not have and does not want information at this time  Dental Care- states his dentist is Dr Regino Schultze in [place] and he sees her once-twice yearly   Hyperlipidemia:  (Status: Condition stable. Not addressed this visit. Lab Results  Component Value Date   CHOL 162 05/15/2021   HDL 48 05/15/2021   LDLCALC 88 05/15/2021   TRIG 147 05/15/2021   CHOLHDL 6.1 (H) 12/20/2019     Medication review performed; medication list updated in electronic medical record.   Hypertension: (Status: Goal on Track (progressing): YES.)-patient states he checks his BP daily and that readings are usually <130/<80.  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 Last practice recorded BP readings:  BP Readings from Last 3 Encounters:  08/01/21 107/76        06/17/22          115-130/60-70 O4/24/24        119/78    Most recent eGFR/CrCl:  Lab Results  Component Value Date   NA 137 05/15/2021   K 4.1 05/15/2021   CREATININE 1.63 (H) 05/15/2021   EGFR 51 (L) 05/15/2021   GFRNONAA 37 (L) 01/10/2020   GLUCOSE 128 (H) 05/15/2021   Evaluation of current treatment plan related to hypertension self management and patient's adherence to plan as established by provider;  Reviewed BP medications and assessed medication taking behavior ensuring he is taking Losartan that was recently added to his regimen Assessed frequency of self monitored blood press and readings, reviewed treatment targets Discussed plans with patient for ongoing care management follow up  and provided patient with direct contact information for care management team; Reviewed scheduled/upcoming provider appointments 08/14/22:  BP medication discontinued  Pain:  (Status: Goal on track: Yes. Medications reviewed Reviewed provider established plan for pain management; Counseled on the importance of reporting any/all new or changed pain symptoms or management strategies to pain management provider;  Weight Loss Interventions:  (Status:  New goal.) Long Term Goal Provided verbal and/or written education to patient re: provider recommended life style modifications  Reviewed recommended dietary changes: avoid fad diets, make small/incremental dietary and exercise changes, eat at the table and avoid eating in front of the TV, plan management of cravings, monitor snacking and cravings in food diary Congratulated patient on good medication taking behavior, ongoing weight loss  and going to gym three times a week for exercise Reviewed Marcelline Deist benefit related to assisting with weight loss Encouraged patient to reschedule with Norm Salt for medical nutrition therapy  Patient Goals/Self-Care Activities: Take medications as prescribed   Attend all scheduled provider appointments Call pharmacy for medication refills 3-7 days in advance of running out of medications Perform all self care activities independently  Perform IADL's (shopping, preparing meals, housekeeping, managing finances) independently Call provider office for new concerns or questions  Call and schedule dentist appointment with Dr. Regino Schultze for check up and cleaning as soon as possible , phone number [phone] Reschedule a follow up appointment with Norm Salt, dietician and CDCES, to help with ongoing weight loss Patient to schedule ENDO f/u.-completed

## 2023-03-18 NOTE — Patient Outreach (Signed)
Medicaid Managed Care   Nurse Care Manager Note  03/18/2023 Name:  [name] MRN:  [id number] DOB:  [birth year]  [name] is an 52 y.o. year old male who is a primary patient of Billie Lade, MD.  The [facility] Managed Care Coordination team was consulted for assistance with:    Chronic healthcare management needs, DM, chronic pain,CKD-3, osteoarthritis, HLD, GERD  [name] was given information about Medicaid Managed Care Coordination team services today. [name] Patient agreed to services and verbal consent obtained.  Engaged with patient by telephone for follow up visit in response to provider referral for case management and/or care coordination services.   Assessments/Interventions:  Review of past medical history, allergies, medications, health status, including review of consultants reports, laboratory and other test data, was performed as part of comprehensive evaluation and provision of chronic care management services.  SDOH (Social Determinants of Health) assessments and interventions performed: SDOH Interventions    Flowsheet Row Patient Outreach Telephone from 03/18/2023 in [place] POPULATION HEALTH DEPARTMENT Patient Outreach Telephone from 12/16/2022 in [place] POPULATION HEALTH DEPARTMENT Patient Outreach Telephone from 09/16/2022 in [place] POPULATION HEALTH DEPARTMENT Patient Outreach Telephone from 08/14/2022 in [place] POPULATION HEALTH DEPARTMENT Patient Outreach Telephone from 06/18/2022 in [place] POPULATION HEALTH DEPARTMENT Patient Outreach Telephone from 03/13/2022 in [facility] Care Coordination  SDOH Interventions        Food Insecurity Interventions -- -- -- Intervention Not Indicated -- --  Housing Interventions -- Intervention Not Indicated -- -- -- --  Transportation Interventions -- -- Intervention Not Indicated -- -- --  Utilities Interventions -- -- Intervention Not Indicated -- -- --  Alcohol  Usage Interventions -- -- -- -- -- Intervention Not Indicated (Score <7)  Financial Strain Interventions -- -- -- Intervention Not Indicated -- --  Physical Activity Interventions Intervention Not Indicated -- -- -- Intervention Not Indicated  [goes to gym 3 times a week] --  Stress Interventions Intervention Not Indicated -- -- -- Intervention Not Indicated --  Health Literacy Interventions -- Intervention Not Indicated -- -- -- --     Care Plan No Known Allergies  Medications Reviewed Today     Reviewed by Danie Chandler, RN (Registered Nurse) on 03/18/23 at 1237  Med List Status: <None>   Medication Order Taking? Sig Documenting Provider Last Dose Status Informant  aspirin EC 81 MG tablet [id number] No Take 81 mg by mouth daily. Swallow whole. [provider] Taking Active   atorvastatin (LIPITOR) 80 MG tablet [id number] No Take 1 tablet (80 mg total) by mouth daily. Billie Lade, MD Taking Active   blood glucose meter kit and supplies KIT [id number] No Test daily Dx  r73.03 Helane Rima, DO Taking Active Self  Blood Glucose Monitoring Suppl DEVI [id number] No 1 each by Does not apply route daily. May substitute to any manufacturer covered by patient's insurance. Billie Lade, MD Taking Active   Blood Pressure Monitor KIT [id number] No 1 each by Does not apply route daily. Heather Roberts, NP Taking Active Self  eszopiclone (LUNESTA) 2 MG TABS tablet [id number] No TAKE (1) TABLET BY MOUTH AT BEDTIME AS NEEDED FOR SLEEP. TAKE IMMEDIATELY BEFORE BEDTIME Billie Lade, MD Taking Active   FARXIGA 5 MG TABS tablet [id number] No TAKE (1) TABLET BY MOUTH EACH MORNING. Billie Lade, MD Taking Active   Glucose Blood (BLOOD GLUCOSE TEST STRIPS) STRP [id number] No Once daily testing May substitute to  any manufacturer covered by [employer]. Billie Lade, MD Taking Active   glucose blood test strip [id number] No Use as instructed. Can check up to 4 times daily. Heather Roberts, NP Taking Active Self  KERENDIA 10 MG TABS [id number] No Take 10 mg by mouth daily. [provider] Taking Active Self  Lancet Device MISC [id number] No Once daily dx e11.9 May substitute to any manufacturer covered by patient's insurance. Billie Lade, MD Taking Active   latanoprost (XALATAN) 0.005 % ophthalmic solution [id number] No Place 1 drop into both eyes at bedtime. [provider] Taking Active Self  methocarbamol (ROBAXIN-750) 750 MG tablet [id number] No Take 1 tablet (750 mg total) by mouth every 8 (eight) hours as needed for muscle spasms (DO NOT TAKE WITH OTHER MUSCLE RELAXERS!). Levester Fresh M, PA-C Taking Active   Oxycodone HCl 20 MG TABS [id number] No Take 20 mg by mouth 4 (four) times daily as needed (pain). [provider] Taking Active Self           Med Note Lenoria Farrier   Thu Nov 27, 2021 12:05 PM)    OZEMPIC, 2 MG/DOSE, 8 MG/3ML SOPN [id number]  INJECT 2 MG INTO THE SKIN ONCE WEEKLY. Billie Lade, MD  Active   sildenafil (VIAGRA) 50 MG tablet [id number] No Take 1 tablet (50 mg total) by mouth daily as needed for erectile dysfunction. Billie Lade, MD Taking Active   timolol (BETIMOL) 0.5 % ophthalmic solution [id number] No Place 1 drop into both eyes in the morning. [provider] Taking Active Self           Patient Active Problem List   Diagnosis Date Noted   Insomnia 05/26/2022   Elevated alkaline phosphatase level 05/26/2022   S/P total right hip arthroplasty 03/03/2022   Annual visit for general adult medical examination with abnormal findings 02/17/2022   S/P total left hip arthroplasty 12/16/2021   Need for varicella vaccine 10/16/2021   OSA  09/11/2021   Pre-operative clearance 06/19/2021   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   Immunization due 02/06/2021   Iron deficiency anemia 12/30/2020   History of colonic polyps 12/30/2020   Unilateral primary  osteoarthritis, right hip 08/13/2020   Unilateral primary osteoarthritis, left hip 08/13/2020   Muscle spasms of both lower extremities 05/23/2020   Need for immunization against influenza 03/06/2020   Primary osteoarthritis of both hips 07/10/2019   Bilateral primary osteoarthritis of hip 07/10/2019   Vitamin D deficiency 04/13/2019   Morbid obesity with body mass index (BMI) of 40.0 to 44.9 in adult (HCC) 04/13/2019   Depressive disorder 04/13/2019   Body mass index (BMI) 45.0-49.9, adult (HCC) 04/13/2019   Erectile dysfunction 01/26/2018   Kidney disease, chronic, stage III (GFR 30-59 ml/min) (HCC) 07/20/2017   History of cerebrovascular accident 07/20/2017   Gastroesophageal reflux disease 07/20/2017   Gastric bypass status for obesity 02/11/2017   Benzodiazepine dependence (HCC) 02/11/2017   Type 2 diabetes mellitus with diabetic nephropathy (HCC) 08/03/2008   Hyperlipidemia 08/03/2008   Hypertension 08/03/2008   Sleep apnea 08/03/2008   Sleep apnea 08/03/2008   Conditions to be addressed/monitored per PCP order:  Chronic healthcare management needs, DM, chronic pain,CKD-3, osteoarthritis, HLD, GERD  Care Plan : RN Care Manager Plan Of Care  Updates made by Danie Chandler, RN since 03/18/2023 12:00 AM     Problem: Chronic Disease Management and Care Coordination Needs for DM, HTN, HLD, CKD  Long-Range Goal: Development of Plan Of Care For Chronic Disease Management and Care Coordination Needs to Assist With Meeting Treatment Goals for DM, CKD, HTN, obesity, chronic hip pain   Start Date: 01/07/2021  Expected End Date: 06/18/2023  Priority: High  Note:   Current Barriers:  Knowledge Deficits related to plan of care for management of HTN, HLD, CKD, and DMII, chronic pain, obesity Chronic Disease Management support and education needs related to HTN, HLD, and DMII, chronic pain, obesity 03/18/23:  Patient states he has ENDO appt next month.  Blood sugar stable-120 today.   No complaints today.  Has upcoming appts with PCP and eye provider.  Recent CARDS appt 10/4  RNCM Clinical Goal(s):  Patient will verbalize understanding of plan for management of HTN, HLD, CKD and DMII take all medications exactly as prescribed and will call provider for medication related questions attend all scheduled medical appointments:  demonstrate ongoing adherence to prescribed treatment plan for HTN, HLD, CKD and DMII as evidenced by daily/ twice daily monitoring and recording of CBG,  adherence to ADA/ carb modified, fat modified, no added salt diet, continue 30-40 minutes of exercise on your NuStep 5-7 days/week, adherence to prescribed medication regimen including new diabetes medication,  contacting provider for new or worsened symptoms or questions related to HTN, DM or HLD, CKD continue to work with Medical illustrator and managed Medicaid pharmacist to address care management and care coordination needs related to HTN, HLD, and DMII , CKD  Interventions: Inter-disciplinary care team collaboration (see longitudinal plan of care) Evaluation of current treatment plan related to  self management and patient's adherence to plan as established by provider Reviewed appointments and insured patient has transportation to all appointments   Chronic Kidney Disease (Status: Goal on Track (progressing): YES.) - all recent provider BP readings are meeting treatment targets, patient reports his nephrologist was pleased that his creatinine has stabilized and that his BP reading during office visit was good ,  patient states he checks his BP daily  and that readings are usually <130/<80 Last practice recorded BP readings:   03/13/22-BP=118/70 BP Readings from Last 3 Encounters:  08/01/21 107/76  06/26/21 110/76      06/18/22         115-130/60-70       Most recent eGFR/CrCl:  Lab Results  Component Value Date   NA 137 05/15/2021   K 4.1 05/15/2021   CREATININE 1.63 (H) 05/15/2021   EGFR 51  (L) 05/15/2021   GFRNONAA 37 (L) 01/10/2020   GLUCOSE 128 (H) 05/15/2021    Reviewed medications with patient and discussed importance of compliance   Assessed frequency of self monitored BP, reviewed home and provider office readings, reviewed treatment targets and reviewed lifestyle strategies   Reviewed addition of Farxiga to medication regimen for blood pressure, blood sugar and weight control in addition to slowing progression of CKD and lowering risks of heart attack, stroke and cardiac death , assessed tolerance of Farxiga and reviewed major adverse side effects   Reviewed scheduled/upcoming provider appointments  Discussed plans with patient for ongoing care management follow up and provided patient with direct contact information for care management team    Diabetes:  (Status: Goal on track: YES.)- patient reports self monitored fasting CBGs are usually <130, checks 1-2 times a day.  Lab Results  Component Value Date   HGBA1C 7.0 (H) 05/15/2021       HGBA1C  5.7                                     02/18/22      HGBA1C                                                      6.3                                     01/05/23 Assessed frequency of CBG testing and reviewed fasting target Reviewed medications, including recent addition of Farxiga to medication regimen with patient and discussed importance of good medication taking behavior Discussed plans with patient for ongoing care management follow up and provided patient with direct contact information for care management team;               Health Maintenance (Status: Condition stable. Not addressed this visit.) - patient completed colonoscopy and endoscopy on 02/26/21 and was aware of pathology report- non cancerous Patient interviewed about adult health maintenance status   Regular eye checkups- says he sees his eye doctor, Dr Dione Booze,  every 6 months due to glaucoma  Advanced Directives-  states he does not have and does not want information at this time  Dental Care- states his dentist is Dr Regino Schultze in [place] and he sees her once-twice yearly   Hyperlipidemia:  (Status: Condition stable. Not addressed this visit. Lab Results  Component Value Date   CHOL 162 05/15/2021   HDL 48 05/15/2021   LDLCALC 88 05/15/2021   TRIG 147 05/15/2021   CHOLHDL 6.1 (H) 12/20/2019     Medication review performed; medication list updated in electronic medical record.   Hypertension: (Status: Goal on Track (progressing): YES.)-patient states he checks his BP daily and that readings are usually <130/<80.  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 Last practice recorded BP readings:  BP Readings from Last 3 Encounters:  08/01/21 107/76        06/17/22          115-130/60-70 O4/24/24        119/78    Most recent eGFR/CrCl:  Lab Results  Component Value Date   NA 137 05/15/2021   K 4.1 05/15/2021   CREATININE 1.63 (H) 05/15/2021   EGFR 51 (L) 05/15/2021   GFRNONAA 37 (L) 01/10/2020   GLUCOSE 128 (H) 05/15/2021   Evaluation of current treatment plan related to hypertension self management and patient's adherence to plan as established by provider;   Reviewed BP medications and assessed medication taking behavior ensuring he is taking Losartan that was recently added to his regimen Assessed frequency of self monitored blood press and readings, reviewed treatment targets Discussed plans with patient for ongoing care management follow up and provided patient with direct contact information for care management team; Reviewed scheduled/upcoming provider appointments 08/14/22:  BP medication discontinued  Pain:  (Status: Goal on track: Yes. Medications reviewed Reviewed provider established plan for pain management; Counseled on the importance of reporting any/all new or changed pain symptoms or management strategies to pain management  provider;  Weight Loss Interventions:  (Status:  New goal.) Long Term  Goal Provided verbal and/or written education to patient re: provider recommended life style modifications  Reviewed recommended dietary changes: avoid fad diets, make small/incremental dietary and exercise changes, eat at the table and avoid eating in front of the TV, plan management of cravings, monitor snacking and cravings in food diary Congratulated patient on good medication taking behavior, ongoing weight loss  and going to gym three times a week for exercise Reviewed Marcelline Deist benefit related to assisting with weight loss Encouraged patient to reschedule with Norm Salt for medical nutrition therapy  Patient Goals/Self-Care Activities: Take medications as prescribed   Attend all scheduled provider appointments Call pharmacy for medication refills 3-7 days in advance of running out of medications Perform all self care activities independently  Perform IADL's (shopping, preparing meals, housekeeping, managing finances) independently Call provider office for new concerns or questions  Call and schedule dentist appointment with Dr. Regino Schultze for check up and cleaning as soon as possible , phone number [phone] Reschedule a follow up appointment with Norm Salt, dietician and [facility], to help with ongoing weight loss Patient to schedule ENDO f/u.-completed   Long-Range Goal: Establish Plan of Care for Chronic Disease Management Needs   Priority: High  Note:   Timeframe:  Long-Range Goal Priority:  High Start Date:     10/21/21                        Expected End Date:  ongoing                     Follow Up Date: 06/18/23   - schedule appointment for vaccines needed due to my age or health - schedule recommended health tests (blood work, mammogram, colonoscopy, pap test) - schedule and keep appointment for annual check-up    Why is this important?   Screening tests can find diseases early when they are easier  to treat.  Your doctor or nurse will talk with you about which tests are important for you.  Getting shots for common diseases like the flu and shingles will help prevent them.  03/18/23:  ENDO appt next month.  PCP and eye appts in Nov.  Evaluated by CARDS 10/4.   Follow Up:  Patient agrees to Care Plan and Follow-up.  Plan: The Managed Medicaid care management team will reach out to the patient again over the next 90 business  days. and The  Patient has been provided with contact information for the Managed Medicaid care management team and has been advised to call with any health related questions or concerns.  Date/time of next scheduled RN care management/care coordination outreach  05/18/23 at 0900.

## 2023-03-31 ENCOUNTER — Other Ambulatory Visit: Payer: Self-pay | Admitting: Internal Medicine

## 2023-03-31 DIAGNOSIS — G4700 Insomnia, unspecified: Secondary | ICD-10-CM

## 2023-03-31 MED ORDER — ESZOPICLONE 2 MG PO TABS: 2.0000 mg | ORAL_TABLET | Freq: Every evening | ORAL | 0 refills | Status: DC | PRN

## 2023-04-01 DIAGNOSIS — E1121 Type 2 diabetes mellitus with diabetic nephropathy: Secondary | ICD-10-CM | POA: Diagnosis not present

## 2023-04-01 DIAGNOSIS — E6609 Other obesity due to excess calories: Secondary | ICD-10-CM | POA: Diagnosis not present

## 2023-04-01 DIAGNOSIS — M160 Bilateral primary osteoarthritis of hip: Secondary | ICD-10-CM | POA: Diagnosis not present

## 2023-04-01 DIAGNOSIS — E1122 Type 2 diabetes mellitus with diabetic chronic kidney disease: Secondary | ICD-10-CM | POA: Diagnosis not present

## 2023-04-01 DIAGNOSIS — M62830 Muscle spasm of back: Secondary | ICD-10-CM | POA: Diagnosis not present

## 2023-04-01 DIAGNOSIS — E559 Vitamin D deficiency, unspecified: Secondary | ICD-10-CM | POA: Diagnosis not present

## 2023-04-01 DIAGNOSIS — F32A Depression, unspecified: Secondary | ICD-10-CM | POA: Diagnosis not present

## 2023-04-01 DIAGNOSIS — M51369 Other intervertebral disc degeneration, lumbar region without mention of lumbar back pain or lower extremity pain: Secondary | ICD-10-CM | POA: Diagnosis not present

## 2023-04-01 DIAGNOSIS — N183 Chronic kidney disease, stage 3 unspecified: Secondary | ICD-10-CM | POA: Diagnosis not present

## 2023-04-01 DIAGNOSIS — I10 Essential (primary) hypertension: Secondary | ICD-10-CM | POA: Diagnosis not present

## 2023-04-01 DIAGNOSIS — Z6837 Body mass index (BMI) 37.0-37.9, adult: Secondary | ICD-10-CM | POA: Diagnosis not present

## 2023-04-07 ENCOUNTER — Ambulatory Visit: Payer: Medicaid Other | Admitting: Internal Medicine

## 2023-04-07 ENCOUNTER — Encounter: Payer: Self-pay | Admitting: Internal Medicine

## 2023-04-07 VITALS — BP 118/62 | HR 75 | Ht 69.0 in | Wt 254.4 lb

## 2023-04-07 DIAGNOSIS — Z23 Encounter for immunization: Secondary | ICD-10-CM

## 2023-04-07 DIAGNOSIS — Z0001 Encounter for general adult medical examination with abnormal findings: Secondary | ICD-10-CM | POA: Insufficient documentation

## 2023-04-07 NOTE — Patient Instructions (Signed)
It was a pleasure to see you today.  Thank you for giving [name] the opportunity to be involved in your care.  Below is a brief recap of your visit and next steps.  We will plan to see you again in 3 months.  Summary Annual physical completed today Flu shot today Follow up in 3 months

## 2023-04-07 NOTE — Assessment & Plan Note (Signed)
Influenza vaccine administered today.

## 2023-04-07 NOTE — Assessment & Plan Note (Signed)
Annual physical exam completed today.  Recent records and labs were reviewed. -Labs from August 2024 reviewed.  We will plan to repeat labs at follow-up in 3 months. -Influenza vaccine administered today.  Remaining preventative care items are up-to-date.

## 2023-04-07 NOTE — Progress Notes (Signed)
Complete physical exam  Patient: [name]   DOB: January 20, 1971   52 y.o. Male  MRN: [id number]  Subjective:    Chief Complaint  Patient presents with   Annual Exam   Sinusitis    Sinus infection has been taking sudafed     [name] is a 52 y.o. male who presents today for a complete physical exam. He reports consuming a general diet. Gym/ health club routine includes light weights and stationary bike. He generally feels well. He reports sleeping well. He does not have additional problems to discuss today.    Most recent fall risk assessment:    04/07/2023    2:42 PM  Fall Risk   Falls in the past year? 0  Number falls in past yr: 0  Injury with Fall? 0  Risk for fall due to : No Fall Risks  Follow up Falls evaluation completed     Most recent depression screenings:    04/07/2023    2:42 PM 01/05/2023    2:22 PM  PHQ 2/9 Scores  PHQ - 2 Score 0 0  PHQ- 9 Score 0     Vision:Within last year and Dental: No current dental problems and Receives regular dental care  Past Medical History:  Diagnosis Date   ALLERGY 08/03/2008   Qualifier: Diagnosis of  By: Elinor Parkinson     Anemia    Arthritis    "hips" (01/15/2015)   Blood transfusion without reported diagnosis    Cellulitis 07/25/2018   Chronic back pain greater than 3 months duration 02/11/2017   Chronic lower back pain    Colon polyps    COLONIC POLYPS, ADENOMATOUS 08/03/2008   Qualifier: Diagnosis of  By: Elinor Parkinson     CVA 08/03/2008   Qualifier: History of  By: Elinor Parkinson     Depression    denies   Diabetes mellitus    diet controlled- no meds since wt loss   Diabetes mellitus (HCC) 07/10/2019   Dysrhythmia    s/p surgery bariatric- cardioversion   Erectile dysfunction 01/26/2018   GERD (gastroesophageal reflux disease)    HIATAL HERNIA 08/03/2008   Qualifier: Diagnosis of  By: Elinor Parkinson     Hip pain    History of gout    Hyperlipidemia    Hypertension    Joint  pain    Narcotic dependence (HCC) 02/11/2017   Neuromuscular disorder (HCC)    PONV (postoperative nausea and vomiting)    Prediabetes 03/29/2017   Sleep apnea    no longer have to use cpap since wt loss   Stage 3 chronic kidney disease (HCC) 01/26/2018   Stroke (HCC) 2006   denies residual on 01/15/2015   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 (HCC) 08/01/2018   Past Surgical History:  Procedure Laterality Date   BARIATRIC SURGERY  2010   in [place]   BIOPSY  02/26/2021   Procedure: BIOPSY;  Surgeon: Dolores Frame, MD;  [facility]: [facility];  Service: Gastroenterology;;   CARDIOVERSION     COLONOSCOPY N/A 08/02/2014   Procedure: COLONOSCOPY;  Surgeon: Malissa Hippo, MD;  [facility]: [facility];  Service: Endoscopy;  Laterality: N/A;  210 - moved to 3/10 @ 11:55 - Ann notified pt   COLONOSCOPY WITH PROPOFOL N/A 02/26/2021   Procedure: COLONOSCOPY WITH PROPOFOL;  Surgeon: Dolores Frame, MD;  [facility]: [facility];  Service: Gastroenterology;  Laterality: N/A;  10:00   ESOPHAGOGASTRODUODENOSCOPY  ESOPHAGOGASTRODUODENOSCOPY (EGD) WITH PROPOFOL N/A 02/26/2021   Procedure: ESOPHAGOGASTRODUODENOSCOPY (EGD) WITH PROPOFOL;  Surgeon: Dolores Frame, MD;  [facility];  Service: Gastroenterology;  Laterality: N/A;   HERNIA REPAIR     LIPOSUCTION     PANNICULECTOMY  01/14/2015   PANNICULECTOMY N/A 01/14/2015   Procedure:  TOTAL PANNICULECTOMY WTH LIPOSUCTION OF SIDES;  Surgeon: Louisa Second, MD;  Location: [facility];  Service: Plastics;  Laterality: N/A;   POLYPECTOMY  02/26/2021   Procedure: POLYPECTOMY INTESTINAL;  Surgeon: Dolores Frame, MD;  [facility];  Service: Gastroenterology;;   TONSILLECTOMY     TOTAL HIP ARTHROPLASTY Left 12/16/2021   Procedure: TOTAL HIP ARTHROPLASTY ANTERIOR APPROACH;  Surgeon: Sheral Apley, MD;  Location: [facility];  Service: Orthopedics;  Laterality: Left;   TOTAL HIP  ARTHROPLASTY Right 03/03/2022   Procedure: TOTAL HIP ARTHROPLASTY ANTERIOR APPROACH;  Surgeon: Sheral Apley, MD;  Location: [facility];  Service: Orthopedics;  Laterality: Right;   VENTRAL HERNIA REPAIR  01/14/2015   VENTRAL HERNIA REPAIR N/A 01/14/2015   Procedure: HERNIA REPAIR VENTRAL ADULT AND MUSCLE REPAIR;  Surgeon: Louisa Second, MD;  Location: [facility];  Service: Plastics;  Laterality: N/A;   Social History   Tobacco Use   Smoking status: Never   Smokeless tobacco: Never  Vaping Use   Vaping status: Never Used  Substance Use Topics   Alcohol use: No   Drug use: No   Family History  Problem Relation Age of Onset   COPD Mother    Depression Mother    Hyperlipidemia Mother    Hypertension Mother    Heart disease Mother    Thyroid disease Mother    Anxiety disorder Mother    Diabetes Father    Pulmonary embolism Father 42       blood clot   Hyperlipidemia Father    Sudden death Father    Obesity Father    Eating disorder Father    Hypertension Brother    No Known Allergies    Patient Care Team: Billie Lade, MD as PCP - General (Internal Medicine) Branch, Dorothe Pea, MD as PCP - Cardiology (Cardiology) Craft, Calvert Cantor, RN as Case Manager   Outpatient Medications Prior to Visit  Medication Sig   aspirin EC 81 MG tablet Take 81 mg by mouth daily. Swallow whole.   atorvastatin (LIPITOR) 80 MG tablet Take 1 tablet (80 mg total) by mouth daily.   blood glucose meter kit and supplies KIT Test daily Dx  r73.03   Blood Glucose Monitoring Suppl DEVI 1 each by Does not apply route daily. May substitute to any manufacturer covered by patient's insurance.   Blood Pressure Monitor KIT 1 each by Does not apply route daily.   eszopiclone (LUNESTA) 2 MG TABS tablet Take 1 tablet (2 mg total) by mouth at bedtime as needed for sleep. Take immediately before bedtime   FARXIGA 5 MG TABS tablet TAKE (1) TABLET BY MOUTH EACH MORNING.   Glucose Blood (BLOOD GLUCOSE TEST STRIPS)  STRP Once daily testing May substitute to any manufacturer covered by patient's insurance.   glucose blood test strip Use as instructed. Can check up to 4 times daily.   KERENDIA 10 MG TABS Take 10 mg by mouth daily.   Lancet Device MISC Once daily dx e11.9 May substitute to any manufacturer covered by patient's insurance.   latanoprost (XALATAN) 0.005 % ophthalmic solution Place 1 drop into both eyes at bedtime.   methocarbamol (ROBAXIN-750)  750 MG tablet Take 1 tablet (750 mg total) by mouth every 8 (eight) hours as needed for muscle spasms (DO NOT TAKE WITH OTHER MUSCLE RELAXERS!).   Oxycodone HCl 20 MG TABS Take 20 mg by mouth 4 (four) times daily as needed (pain).   OZEMPIC, 2 MG/DOSE, 8 MG/3ML SOPN INJECT 2 MG INTO THE SKIN ONCE WEEKLY.   sildenafil (VIAGRA) 50 MG tablet Take 1 tablet (50 mg total) by mouth daily as needed for erectile dysfunction.   timolol (BETIMOL) 0.5 % ophthalmic solution Place 1 drop into both eyes in the morning.   No facility-administered medications prior to visit.   Review of Systems  Constitutional:  Negative for chills and fever.  HENT:  Positive for congestion. Negative for sore throat.   Respiratory:  Negative for cough and shortness of breath.   Cardiovascular:  Negative for chest pain, palpitations and leg swelling.  Gastrointestinal:  Negative for abdominal pain, blood in stool, constipation, diarrhea, nausea and vomiting.  Genitourinary:  Negative for dysuria and hematuria.  Musculoskeletal:  Negative for myalgias.  Skin:  Negative for itching and rash.  Neurological:  Negative for dizziness and headaches.  Psychiatric/Behavioral:  Negative for depression and suicidal ideas.       Objective:     BP 118/62   Pulse 75   Ht 5\' 9"  (1.753 m)   Wt 254 lb 6.4 oz (115.4 kg)   SpO2 96%   BMI 37.57 kg/m  BP Readings from Last 3 Encounters:  04/07/23 118/62  02/26/23 136/70  01/05/23 109/72   Physical Exam Vitals reviewed.  Constitutional:       General: He is not in acute distress.    Appearance: Normal appearance. He is obese. He is not ill-appearing.  HENT:     Head: Normocephalic and atraumatic.     Right Ear: External ear normal.     Left Ear: External ear normal.     Nose: Congestion present.     Mouth/Throat:     Mouth: Mucous membranes are moist.     Pharynx: Oropharynx is clear.  Eyes:     General: No scleral icterus.    Extraocular Movements: Extraocular movements intact.     Conjunctiva/sclera: Conjunctivae normal.     Pupils: Pupils are equal, round, and reactive to light.  Cardiovascular:     Rate and Rhythm: Normal rate and regular rhythm.     Pulses: Normal pulses.     Heart sounds: Normal heart sounds. No murmur heard. Pulmonary:     Effort: Pulmonary effort is normal.     Breath sounds: Normal breath sounds. No wheezing, rhonchi or rales.  Abdominal:     General: Abdomen is flat. Bowel sounds are normal. There is no distension.     Palpations: Abdomen is soft.     Tenderness: There is no abdominal tenderness.  Musculoskeletal:        General: No swelling or deformity. Normal range of motion.     Cervical back: Normal range of motion.  Skin:    General: Skin is warm and dry.     Capillary Refill: Capillary refill takes less than 2 seconds.  Neurological:     General: No focal deficit present.     Mental Status: He is alert and oriented to person, place, and time.     Motor: No weakness.  Psychiatric:        Mood and Affect: Mood normal.        Behavior: Behavior normal.  Thought Content: Thought content normal.   Last CBC Lab Results  Component Value Date   WBC 8.8 02/18/2022   HGB 14.8 02/18/2022   HCT 44.7 02/18/2022   MCV 94.7 02/18/2022   MCH 31.4 02/18/2022   RDW 13.4 02/18/2022   PLT 214 02/18/2022   Last metabolic panel Lab Results  Component Value Date   GLUCOSE 116 (H) 05/20/2022   NA 140 05/20/2022   K 4.1 05/20/2022   CL 101 05/20/2022   CO2 23 05/20/2022   BUN 21  05/20/2022   CREATININE 1.82 (H) 05/20/2022   EGFR 44 (L) 05/20/2022   CALCIUM 9.7 05/20/2022   PROT 6.8 05/20/2022   ALBUMIN 4.1 05/20/2022   LABGLOB 2.7 05/20/2022   AGRATIO 1.5 05/20/2022   BILITOT 0.6 05/20/2022   ALKPHOS 124 (H) 05/20/2022   AST 26 05/20/2022   ALT 19 05/20/2022   ANIONGAP 8 02/18/2022   Last lipids Lab Results  Component Value Date   CHOL 146 01/05/2023   HDL 37 (L) 01/05/2023   LDLCALC 78 01/05/2023   TRIG 183 (H) 01/05/2023   CHOLHDL 3.9 01/05/2023   Last hemoglobin A1c Lab Results  Component Value Date   HGBA1C 6.3 01/05/2023   Last thyroid functions Lab Results  Component Value Date   TSH 1.81 04/13/2019   T3TOTAL 86 02/15/2019   Last vitamin D Lab Results  Component Value Date   VD25OH 57.0 05/20/2022   Last vitamin B12 and Folate Lab Results  Component Value Date   VITAMINB12 815 04/13/2019      Assessment & Plan:    Routine Health Maintenance and Physical Exam  Immunization History  Administered Date(s) Administered   Influenza, Seasonal, Injecte, Preservative Fre 04/07/2023   Influenza,inj,Quad PF,6+ Mos 02/11/2017, 01/26/2018, 04/24/2019, 03/06/2020, 02/06/2021, 02/17/2022   PNEUMOCOCCAL CONJUGATE-20 05/15/2021   Pneumococcal-Unspecified 02/22/2009   Tdap 10/24/2019   Zoster Recombinant(Shingrix) 02/06/2021, 10/16/2021    Health Maintenance  Topic Date Due   Diabetic kidney evaluation - eGFR measurement  05/21/2023   HEMOGLOBIN A1C  07/08/2023   Diabetic kidney evaluation - Urine ACR  07/24/2023   FOOT EXAM  01/05/2024   Colonoscopy  02/27/2024   DTaP/Tdap/Td (2 - Td or Tdap) 10/23/2029   INFLUENZA VACCINE  Completed   Hepatitis C Screening  Completed   HIV Screening  Completed   HPV VACCINES  Aged Out   OPHTHALMOLOGY EXAM  Discontinued   COVID-19 Vaccine  Discontinued   Zoster Vaccines- Shingrix  Discontinued    Discussed health benefits of physical activity, and encouraged him to engage in regular exercise  appropriate for his age and condition.  Problem List Items Addressed This Visit       Need for immunization against influenza    Influenza vaccine administered today      Encounter for well adult exam with abnormal findings    Annual physical exam completed today.  Recent records and labs were reviewed. -Labs from August 2024 reviewed.  We will plan to repeat labs at follow-up in 3 months. -Influenza vaccine administered today.  Remaining preventative care items are up-to-date.      Return in about 3 months (around 07/08/2023).  Billie Lade, MD

## 2023-04-09 DIAGNOSIS — M1611 Unilateral primary osteoarthritis, right hip: Secondary | ICD-10-CM | POA: Diagnosis not present

## 2023-04-14 ENCOUNTER — Other Ambulatory Visit: Payer: Self-pay | Admitting: Internal Medicine

## 2023-04-14 DIAGNOSIS — E1121 Type 2 diabetes mellitus with diabetic nephropathy: Secondary | ICD-10-CM

## 2023-04-27 DIAGNOSIS — N183 Chronic kidney disease, stage 3 unspecified: Secondary | ICD-10-CM | POA: Diagnosis not present

## 2023-04-27 DIAGNOSIS — M51369 Other intervertebral disc degeneration, lumbar region without mention of lumbar back pain or lower extremity pain: Secondary | ICD-10-CM | POA: Diagnosis not present

## 2023-04-27 DIAGNOSIS — E559 Vitamin D deficiency, unspecified: Secondary | ICD-10-CM | POA: Diagnosis not present

## 2023-04-27 DIAGNOSIS — Z6837 Body mass index (BMI) 37.0-37.9, adult: Secondary | ICD-10-CM | POA: Diagnosis not present

## 2023-04-27 DIAGNOSIS — M62830 Muscle spasm of back: Secondary | ICD-10-CM | POA: Diagnosis not present

## 2023-04-27 DIAGNOSIS — E1121 Type 2 diabetes mellitus with diabetic nephropathy: Secondary | ICD-10-CM | POA: Diagnosis not present

## 2023-04-27 DIAGNOSIS — E1122 Type 2 diabetes mellitus with diabetic chronic kidney disease: Secondary | ICD-10-CM | POA: Diagnosis not present

## 2023-04-27 DIAGNOSIS — Z79899 Other long term (current) drug therapy: Secondary | ICD-10-CM | POA: Diagnosis not present

## 2023-04-27 DIAGNOSIS — M160 Bilateral primary osteoarthritis of hip: Secondary | ICD-10-CM | POA: Diagnosis not present

## 2023-04-27 DIAGNOSIS — E6609 Other obesity due to excess calories: Secondary | ICD-10-CM | POA: Diagnosis not present

## 2023-04-27 DIAGNOSIS — I10 Essential (primary) hypertension: Secondary | ICD-10-CM | POA: Diagnosis not present

## 2023-04-29 ENCOUNTER — Other Ambulatory Visit: Payer: Self-pay | Admitting: Internal Medicine

## 2023-04-29 DIAGNOSIS — N1831 Chronic kidney disease, stage 3a: Secondary | ICD-10-CM | POA: Diagnosis not present

## 2023-04-29 DIAGNOSIS — G4700 Insomnia, unspecified: Secondary | ICD-10-CM

## 2023-04-29 DIAGNOSIS — E1129 Type 2 diabetes mellitus with other diabetic kidney complication: Secondary | ICD-10-CM | POA: Diagnosis not present

## 2023-04-29 DIAGNOSIS — R809 Proteinuria, unspecified: Secondary | ICD-10-CM | POA: Diagnosis not present

## 2023-04-29 DIAGNOSIS — E1122 Type 2 diabetes mellitus with diabetic chronic kidney disease: Secondary | ICD-10-CM | POA: Diagnosis not present

## 2023-04-29 DIAGNOSIS — G4733 Obstructive sleep apnea (adult) (pediatric): Secondary | ICD-10-CM | POA: Diagnosis not present

## 2023-04-29 DIAGNOSIS — N189 Chronic kidney disease, unspecified: Secondary | ICD-10-CM | POA: Diagnosis not present

## 2023-04-29 DIAGNOSIS — I5032 Chronic diastolic (congestive) heart failure: Secondary | ICD-10-CM | POA: Diagnosis not present

## 2023-05-06 DIAGNOSIS — N1831 Chronic kidney disease, stage 3a: Secondary | ICD-10-CM | POA: Diagnosis not present

## 2023-05-06 DIAGNOSIS — E1129 Type 2 diabetes mellitus with other diabetic kidney complication: Secondary | ICD-10-CM | POA: Diagnosis not present

## 2023-05-06 DIAGNOSIS — E1122 Type 2 diabetes mellitus with diabetic chronic kidney disease: Secondary | ICD-10-CM | POA: Diagnosis not present

## 2023-05-06 DIAGNOSIS — R809 Proteinuria, unspecified: Secondary | ICD-10-CM | POA: Diagnosis not present

## 2023-05-11 DIAGNOSIS — H5213 Myopia, bilateral: Secondary | ICD-10-CM | POA: Diagnosis not present

## 2023-05-12 ENCOUNTER — Other Ambulatory Visit: Payer: Self-pay | Admitting: Internal Medicine

## 2023-05-12 DIAGNOSIS — E1121 Type 2 diabetes mellitus with diabetic nephropathy: Secondary | ICD-10-CM

## 2023-05-17 ENCOUNTER — Other Ambulatory Visit: Payer: Self-pay | Admitting: Internal Medicine

## 2023-05-17 DIAGNOSIS — E1121 Type 2 diabetes mellitus with diabetic nephropathy: Secondary | ICD-10-CM

## 2023-05-28 DIAGNOSIS — I10 Essential (primary) hypertension: Secondary | ICD-10-CM | POA: Diagnosis not present

## 2023-05-28 DIAGNOSIS — E6609 Other obesity due to excess calories: Secondary | ICD-10-CM | POA: Diagnosis not present

## 2023-05-28 DIAGNOSIS — M51369 Other intervertebral disc degeneration, lumbar region without mention of lumbar back pain or lower extremity pain: Secondary | ICD-10-CM | POA: Diagnosis not present

## 2023-05-28 DIAGNOSIS — M160 Bilateral primary osteoarthritis of hip: Secondary | ICD-10-CM | POA: Diagnosis not present

## 2023-05-28 DIAGNOSIS — M62830 Muscle spasm of back: Secondary | ICD-10-CM | POA: Diagnosis not present

## 2023-05-28 DIAGNOSIS — E1121 Type 2 diabetes mellitus with diabetic nephropathy: Secondary | ICD-10-CM | POA: Diagnosis not present

## 2023-05-28 DIAGNOSIS — Z79899 Other long term (current) drug therapy: Secondary | ICD-10-CM | POA: Diagnosis not present

## 2023-05-28 DIAGNOSIS — E559 Vitamin D deficiency, unspecified: Secondary | ICD-10-CM | POA: Diagnosis not present

## 2023-05-28 DIAGNOSIS — Z6837 Body mass index (BMI) 37.0-37.9, adult: Secondary | ICD-10-CM | POA: Diagnosis not present

## 2023-05-31 ENCOUNTER — Other Ambulatory Visit: Payer: Self-pay | Admitting: Internal Medicine

## 2023-05-31 DIAGNOSIS — G4700 Insomnia, unspecified: Secondary | ICD-10-CM

## 2023-06-02 ENCOUNTER — Other Ambulatory Visit: Payer: Self-pay | Admitting: Internal Medicine

## 2023-06-02 ENCOUNTER — Telehealth: Payer: Self-pay

## 2023-06-02 DIAGNOSIS — G4700 Insomnia, unspecified: Secondary | ICD-10-CM

## 2023-06-02 MED ORDER — ESZOPICLONE 2 MG PO TABS: 2.0000 mg | ORAL_TABLET | Freq: Every evening | ORAL | 0 refills | Status: DC | PRN

## 2023-06-02 NOTE — Telephone Encounter (Signed)
 Copied from CRM [id number]. Topic: Clinical - Prescription Issue >> Jun 02, 2023  3:47 PM Antonio DEL wrote: Reason for CRM: Patient stated pharmacy [facility]) is saying they have not received refill request for eszopiclone  (LUNESTA ) 2 MG TABS. He spoke with them today. I'm seeing that it was sent to pharmacy on 1/6, patient says he has been out of medication for a few days now.

## 2023-06-07 NOTE — Telephone Encounter (Signed)
 Patient advised.

## 2023-06-09 ENCOUNTER — Other Ambulatory Visit: Payer: Self-pay | Admitting: Internal Medicine

## 2023-06-09 DIAGNOSIS — E1121 Type 2 diabetes mellitus with diabetic nephropathy: Secondary | ICD-10-CM

## 2023-06-09 IMAGING — CT CT HIP*R* W/O CM
1 series · 15 of 32 positions shown, 19 images · non-contrast
Comparison: CT chest, abdomen and pelvis 02/20/2016.

CLINICAL DATA: Chronic right hip pain. Preoperative planning study.
No known injury.

EXAM:
CT OF THE RIGHT HIP WITHOUT CONTRAST
TECHNIQUE: Multidetector CT imaging of the right hip was performed according to
the standard protocol. Multiplanar CT image reconstructions were
also generated.
RADIATION DOSE REDUCTION: This exam was performed according to the
departmental dose-optimization program which includes automated
exposure control, adjustment of the mA and/or kV according to
patient size and/or use of iterative reconstruction technique.

[Series 4: soft tissue pelvis/hip · axial · 0.53mm/px · z∈[+550,+786]mm · 15 of 88 slices shown, 19 images]
[im 6/88  soft-tissue]
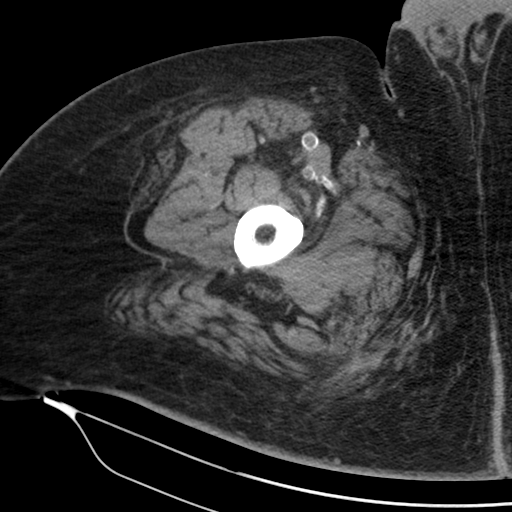
[im 6/88  bone]
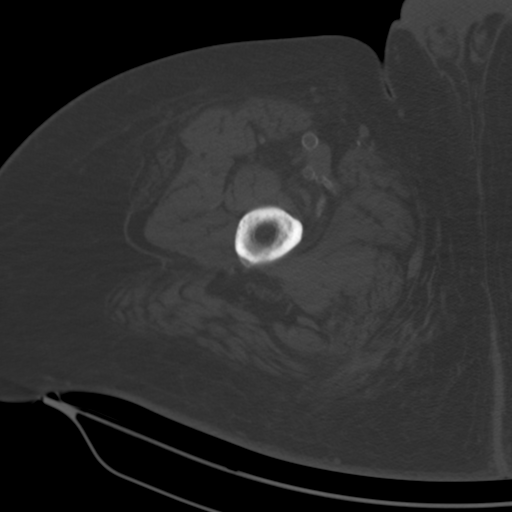
[im 12/88  soft-tissue]
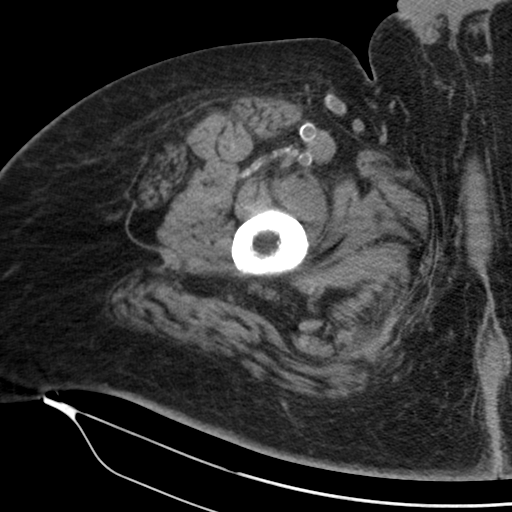
[im 17/88  soft-tissue]
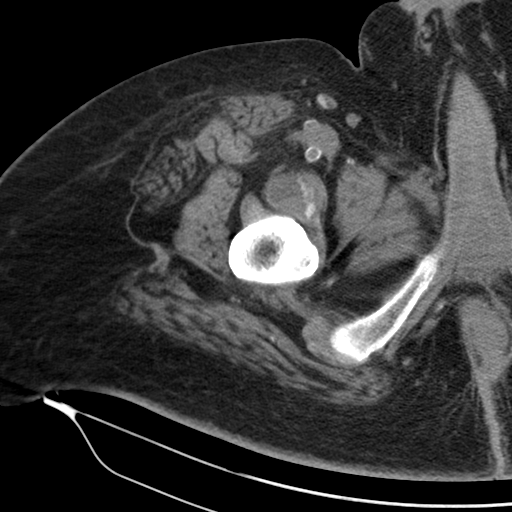
[im 26/88  soft-tissue]
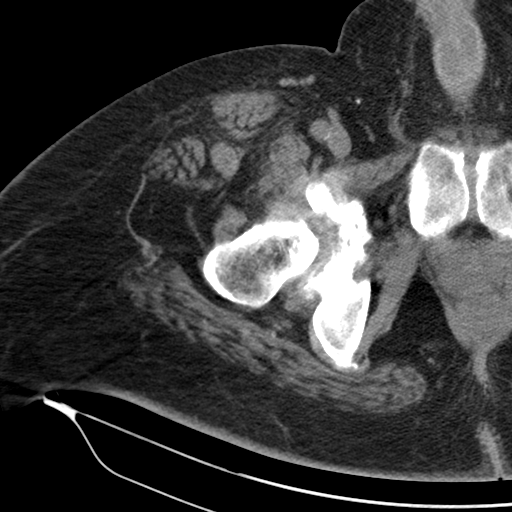
[im 31/88  soft-tissue]
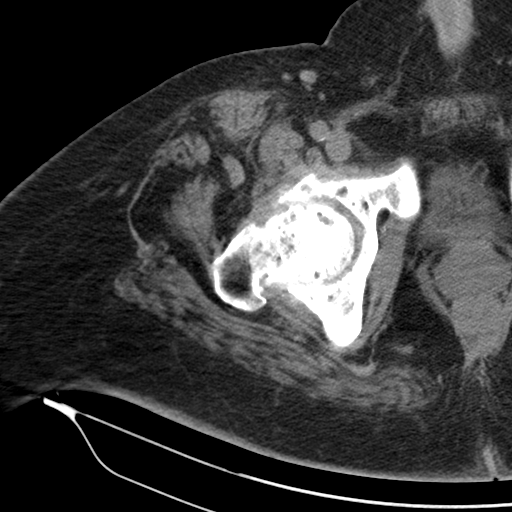
[im 37/88  soft-tissue]
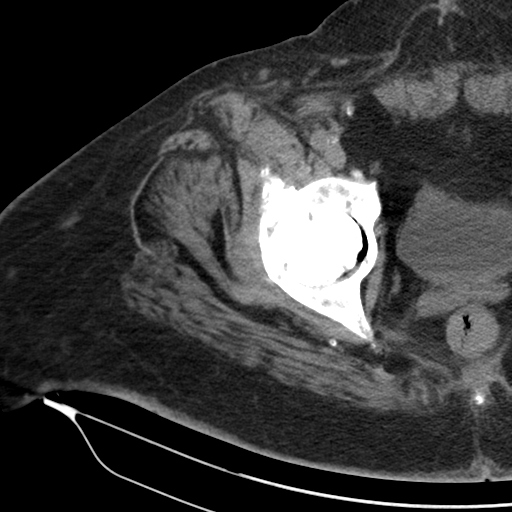
[im 45/88  soft-tissue]
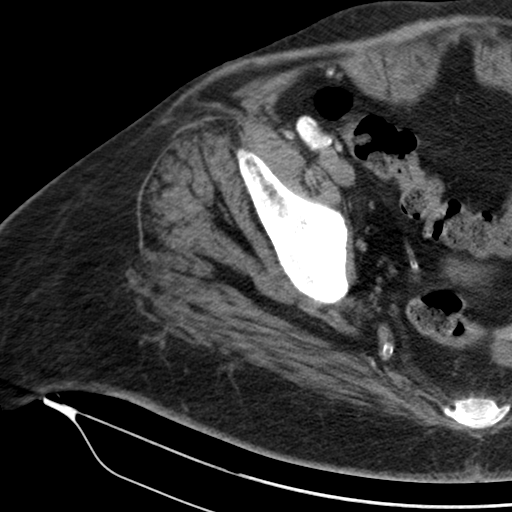
[im 51/88  soft-tissue]
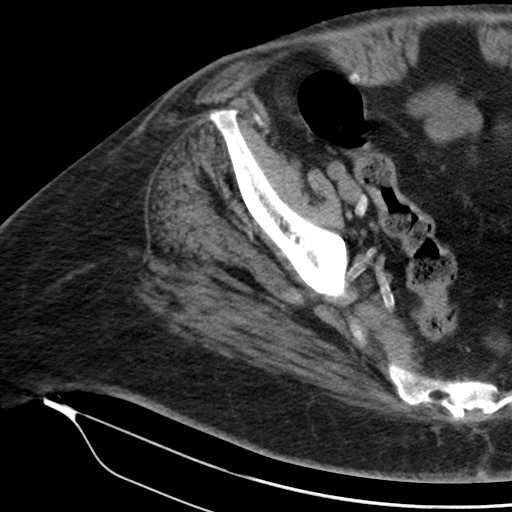
[im 57/88  soft-tissue]
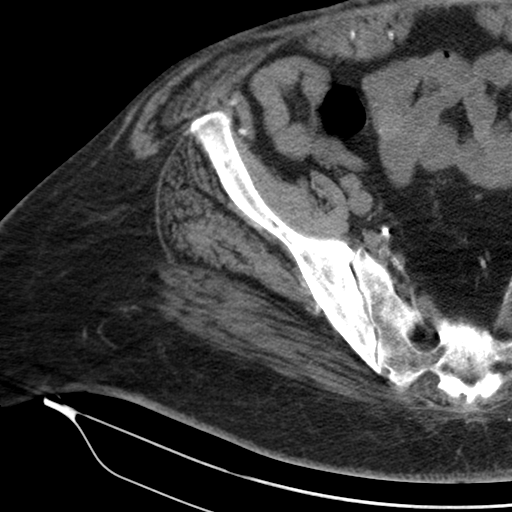
[im 57/88  bone]
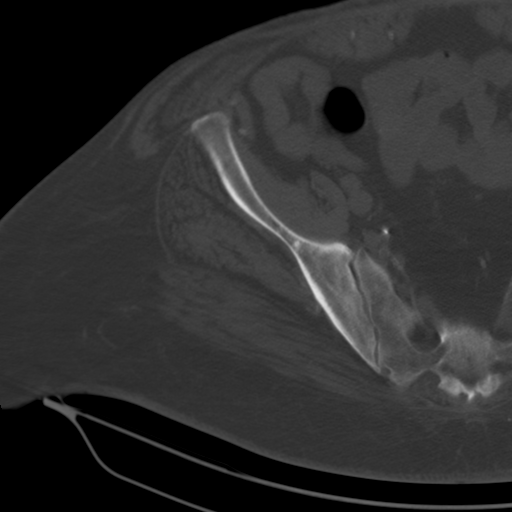
[im 62/88  soft-tissue]
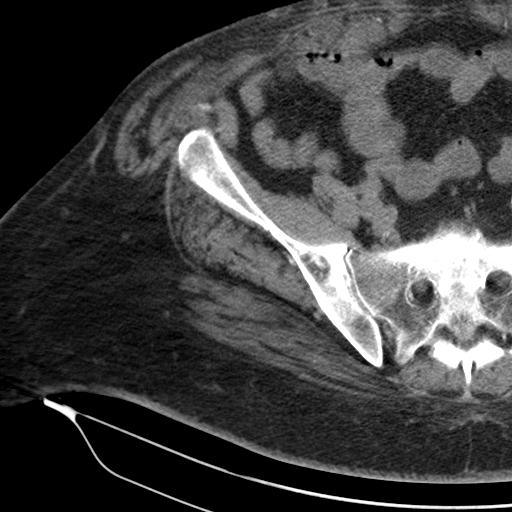
[im 71/88  soft-tissue]
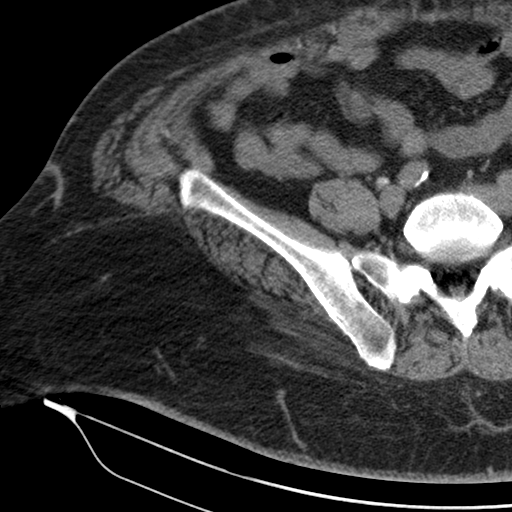
[im 76/88  soft-tissue]
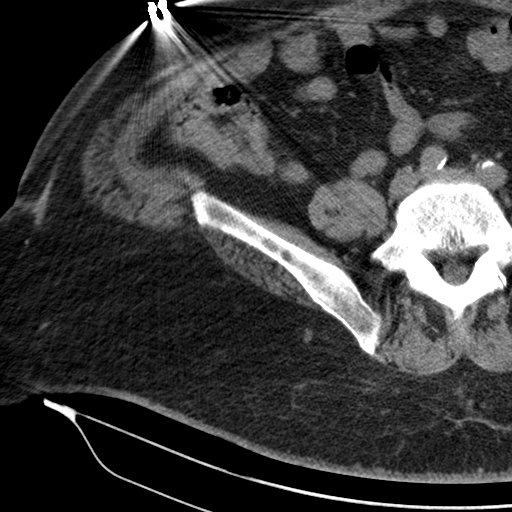
[im 76/88  lung]
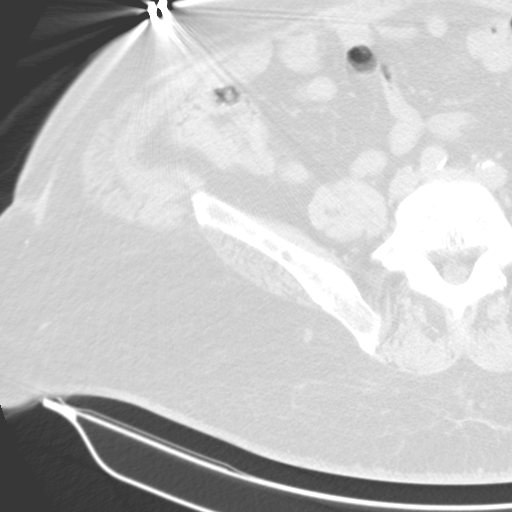
[im 79/88  lung]
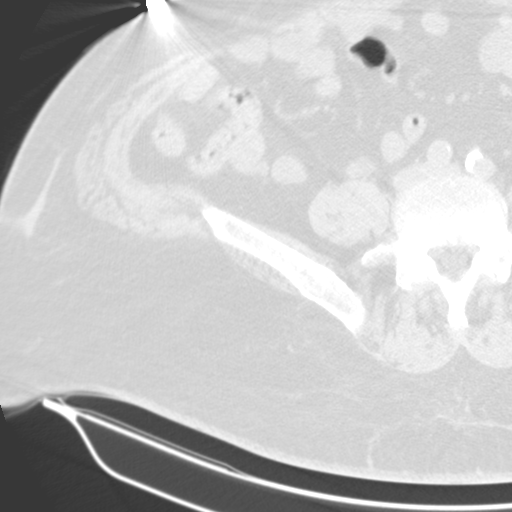
[im 82/88  soft-tissue]
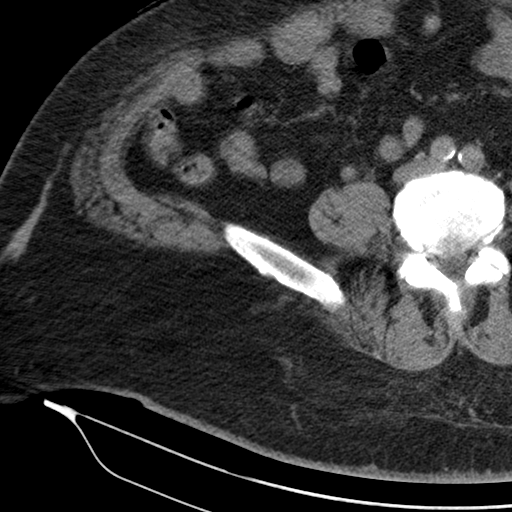
[im 82/88  lung]
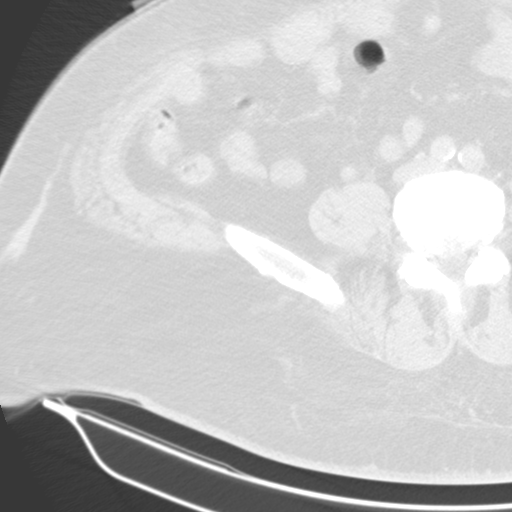
[im 85/88  lung]
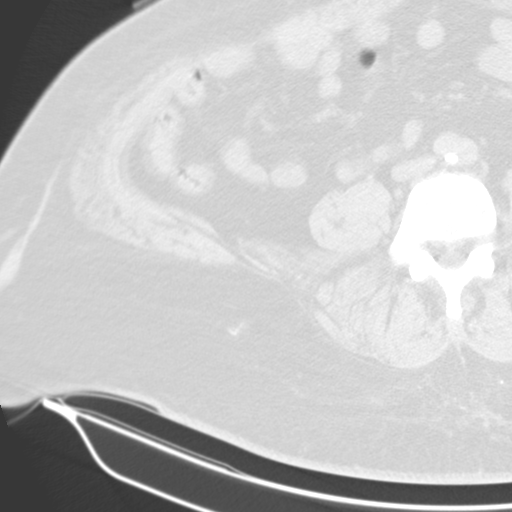

[15 of 32 positions shown; findings below may reference images not displayed]

FINDINGS: Bones/Joint/Cartilage

Severe right hip osteoarthritis with bone-on-bone joint space
narrowing, subchondral cyst formation and osteophytosis have
worsened since the prior examination. There is acetabular protrusio
on the right and gas in the right hip. Remodeling of the right
acetabular roof and bony covering to the head neck junction of the
right hip are also seen. No avascular necrosis of the right femoral
head or worrisome bony lesion is identified. Lower lumbar
degenerative disease and postoperative change noted.

Ligaments

Suboptimally assessed by CT.

Muscles and Tendons

There is mild-to-moderate fatty atrophy of the right gluteus
maximus. No muscle or tendon tear is identified.

Soft tissues

Atherosclerosis is seen.
IMPRESSION: Severe right hip osteoarthritis with thinning of the medial wall of
the right acetabulum/acetabular protrusio. The appearance is worse
than on the 1070 CT.

## 2023-06-10 ENCOUNTER — Encounter: Payer: Self-pay | Admitting: Internal Medicine

## 2023-06-15 ENCOUNTER — Telehealth: Payer: Medicaid Other | Admitting: Internal Medicine

## 2023-06-18 ENCOUNTER — Other Ambulatory Visit: Payer: Self-pay | Admitting: Obstetrics and Gynecology

## 2023-06-18 NOTE — Patient Outreach (Signed)
  Medicaid Managed Care   Unsuccessful Attempt Note   06/18/2023 Name: [name] MRN: [id number] DOB: February 12, 1971  Referred by: Billie Lade, MD Reason for referral : High Risk Managed Medicaid (Unsuccessful telephone outreach)  An unsuccessful telephone outreach was attempted today. The patient was referred to the case management team for assistance with care management and care coordination.    Follow Up Plan: The Managed Medicaid care management team will reach out to the patient again over the next 30 business  days. and The  Patient has been provided with contact information for the Managed Medicaid care management team and has been advised to call with any health related questions or concerns.   Kathi Der RN, BSN, [employer] [facility] RN Care Manager Direct Dial [phone]/WGN [phone] Website: [email or web address]

## 2023-06-18 NOTE — Patient Instructions (Signed)
Hi Mr. Shiraishi, I hope you are doing well-sorry to miss you today - as a part of your Medicaid benefit, you are eligible for care management and care coordination services at no cost or copay. I was unable to reach you by phone today but would be happy to help you with your health related needs. Please feel free to cal me at [phone].  A member of the Managed Medicaid care management team will reach out to you again over the next 30 business  days.   Kathi Der RN, BSN, [employer] [facility] RN Care Manager Direct Dial [phone]/GNF [phone] Website: [email or web address]

## 2023-06-22 ENCOUNTER — Other Ambulatory Visit: Payer: Self-pay | Admitting: Obstetrics and Gynecology

## 2023-06-22 NOTE — Patient Outreach (Signed)
  Medicaid Managed Care   Unsuccessful Attempt Note   06/22/2023 Name: [name] MRN: [id number] DOB: 1971/04/03  Referred by: Billie Lade, MD Reason for referral : High Risk Managed Medicaid (Unsuccessful telephone outreach)  A second unsuccessful telephone outreach was attempted today. The patient was referred to the case management team for assistance with care management and care coordination.    Follow Up Plan: The Managed Medicaid care management team will reach out to the patient again over the next 30 business  days. and The  Patient has been provided with contact information for the Managed Medicaid care management team and has been advised to call with any health related questions or concerns.   Kathi Der RN, BSN, [employer] [facility] RN Care Manager Direct Dial [phone]/WGN [phone] Website: [email or web address]

## 2023-06-22 NOTE — Patient Instructions (Signed)
Hi [name], I am sorry to miss you again today, I hope you are okay - as a part of your Medicaid benefit, you are eligible for care management and care coordination services at no cost or copay. I was unable to reach you by phone today but would be happy to help you with your health related needs. Please feel free to call me at [phone].   A member of the Managed Medicaid care management team will reach out to you again over the next 30 business  days.   Kathi Der RN, BSN, [employer] [facility] RN Care Manager Direct Dial [phone]/GNF [phone] Website: [email or web address]

## 2023-06-29 ENCOUNTER — Other Ambulatory Visit: Payer: Self-pay | Admitting: Internal Medicine

## 2023-06-29 DIAGNOSIS — G4700 Insomnia, unspecified: Secondary | ICD-10-CM

## 2023-07-01 DIAGNOSIS — H524 Presbyopia: Secondary | ICD-10-CM | POA: Diagnosis not present

## 2023-07-02 DIAGNOSIS — M62830 Muscle spasm of back: Secondary | ICD-10-CM | POA: Diagnosis not present

## 2023-07-02 DIAGNOSIS — E6609 Other obesity due to excess calories: Secondary | ICD-10-CM | POA: Diagnosis not present

## 2023-07-02 DIAGNOSIS — I10 Essential (primary) hypertension: Secondary | ICD-10-CM | POA: Diagnosis not present

## 2023-07-02 DIAGNOSIS — M51369 Other intervertebral disc degeneration, lumbar region without mention of lumbar back pain or lower extremity pain: Secondary | ICD-10-CM | POA: Diagnosis not present

## 2023-07-02 DIAGNOSIS — M160 Bilateral primary osteoarthritis of hip: Secondary | ICD-10-CM | POA: Diagnosis not present

## 2023-07-02 DIAGNOSIS — E559 Vitamin D deficiency, unspecified: Secondary | ICD-10-CM | POA: Diagnosis not present

## 2023-07-02 DIAGNOSIS — Z79899 Other long term (current) drug therapy: Secondary | ICD-10-CM | POA: Diagnosis not present

## 2023-07-02 DIAGNOSIS — Z6836 Body mass index (BMI) 36.0-36.9, adult: Secondary | ICD-10-CM | POA: Diagnosis not present

## 2023-07-04 ENCOUNTER — Other Ambulatory Visit: Payer: Self-pay | Admitting: Internal Medicine

## 2023-07-04 DIAGNOSIS — E782 Mixed hyperlipidemia: Secondary | ICD-10-CM

## 2023-07-07 ENCOUNTER — Other Ambulatory Visit: Payer: Self-pay | Admitting: Internal Medicine

## 2023-07-07 DIAGNOSIS — E1121 Type 2 diabetes mellitus with diabetic nephropathy: Secondary | ICD-10-CM

## 2023-07-08 ENCOUNTER — Ambulatory Visit: Payer: Medicaid Other | Admitting: Internal Medicine

## 2023-07-08 ENCOUNTER — Encounter: Payer: Self-pay | Admitting: Internal Medicine

## 2023-07-08 VITALS — BP 134/84 | HR 109 | Ht 69.0 in | Wt 249.2 lb

## 2023-07-08 DIAGNOSIS — N1832 Chronic kidney disease, stage 3b: Secondary | ICD-10-CM

## 2023-07-08 DIAGNOSIS — E782 Mixed hyperlipidemia: Secondary | ICD-10-CM | POA: Diagnosis not present

## 2023-07-08 DIAGNOSIS — G4700 Insomnia, unspecified: Secondary | ICD-10-CM

## 2023-07-08 DIAGNOSIS — E559 Vitamin D deficiency, unspecified: Secondary | ICD-10-CM | POA: Diagnosis not present

## 2023-07-08 DIAGNOSIS — E1121 Type 2 diabetes mellitus with diabetic nephropathy: Secondary | ICD-10-CM

## 2023-07-08 DIAGNOSIS — I10 Essential (primary) hypertension: Secondary | ICD-10-CM

## 2023-07-08 DIAGNOSIS — F419 Anxiety disorder, unspecified: Secondary | ICD-10-CM

## 2023-07-08 MED ORDER — HYDROXYZINE PAMOATE 25 MG PO CAPS: 25.0000 mg | ORAL_CAPSULE | Freq: Three times a day (TID) | ORAL | 2 refills | Status: DC | PRN

## 2023-07-08 NOTE — Assessment & Plan Note (Signed)
His acute concern today is feeling overwhelmed and becoming agitated more easily.  He describes feeling more anxious than usual and is interested in medication for as needed symptom relief.  Through shared decision making, hydroxyzine 25 mg every 8 hours for anxiety relief was added today.  He will follow-up if symptoms worsen or fail to improve.

## 2023-07-08 NOTE — Assessment & Plan Note (Signed)
Symptoms are adequately controlled with nightly use of Lunesta.

## 2023-07-08 NOTE — Assessment & Plan Note (Signed)
A1c 6.3 on labs from August 2024.  He remains on Ozempic 2 mg weekly and Farxiga 5 mg daily.  Repeat A1c and urine microalbumin/creatinine ratio ordered today.

## 2023-07-08 NOTE — Assessment & Plan Note (Signed)
Followed by nephrology (Dr. Wolfgang Phoenix).  Currently prescribed [place].  Repeat labs ordered today.

## 2023-07-08 NOTE — Assessment & Plan Note (Signed)
Noted on previous labs.  Repeat vitamin D level ordered today

## 2023-07-08 NOTE — Assessment & Plan Note (Signed)
Lipid panel updated in August.  Total cholesterol 146 and LDL 78.  Atorvastatin was increased to 80 mg daily in light of this result.  Repeat lipid panel ordered today.

## 2023-07-08 NOTE — Patient Instructions (Signed)
It was a pleasure to see you today.  Thank you for giving [name] the opportunity to be involved in your care.  Below is a brief recap of your visit and next steps.  We will plan to see you again in 6 months.  Summary Add hydroxyzine for as needed anxiety / stress relief Repeat labs ordered Follow up in 6 months

## 2023-07-08 NOTE — Assessment & Plan Note (Signed)
Remains adequately controlled off antihypertensive medication

## 2023-07-08 NOTE — Progress Notes (Signed)
Established Patient Office Visit  Subjective   Patient ID: [name], male    DOB: 11-11-1970  Age: 53 y.o. MRN: [id number]  Chief Complaint  Patient presents with   Anxiety    Follow up, patient is having outbrust of anger   [name] returns to care today for routine follow-up.  He was last evaluated by me in November 2024 for his annual physical.  No medication changes were made that time and 91-month follow-up was arranged.  There have been no acute interval events. [name] reports feeling fairly well today.  His acute concern is increased agitation and feeling overwhelmed.  He would like to discuss as needed medication options.  He is otherwise asymptomatic and has no acute concerns to discuss.  Past Medical History:  Diagnosis Date   ALLERGY 08/03/2008   Qualifier: Diagnosis of  By: Elinor Parkinson     Anemia    Arthritis    "hips" (01/15/2015)   Blood transfusion without reported diagnosis    Cellulitis 07/25/2018   Chronic back pain greater than 3 months duration 02/11/2017   Chronic lower back pain    Colon polyps    COLONIC POLYPS, ADENOMATOUS 08/03/2008   Qualifier: Diagnosis of  By: Elinor Parkinson     CVA 08/03/2008   Qualifier: History of  By: Elinor Parkinson     Depression    denies   Diabetes mellitus    diet controlled- no meds since wt loss   Diabetes mellitus (HCC) 07/10/2019   Dysrhythmia    s/p surgery bariatric- cardioversion   Erectile dysfunction 01/26/2018   GERD (gastroesophageal reflux disease)    HIATAL HERNIA 08/03/2008   Qualifier: Diagnosis of  By: Elinor Parkinson     Hip pain    History of gout    Hyperlipidemia    Hypertension    Joint pain    Narcotic dependence (HCC) 02/11/2017   Neuromuscular disorder (HCC)    PONV (postoperative nausea and vomiting)    Prediabetes 03/29/2017   Sleep apnea    no longer have to use cpap since wt loss   Stage 3 chronic kidney disease (HCC) 01/26/2018   Stroke (HCC) 2006   denies  residual on 01/15/2015   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 (HCC) 08/01/2018   Past Surgical History:  Procedure Laterality Date   BARIATRIC SURGERY  2010   in [place]   BIOPSY  02/26/2021   Procedure: BIOPSY;  Surgeon: Dolores Frame, MD;  [facility]: [facility];  Service: Gastroenterology;;   CARDIOVERSION     COLONOSCOPY N/A 08/02/2014   Procedure: COLONOSCOPY;  Surgeon: Malissa Hippo, MD;  [facility]: [facility];  Service: Endoscopy;  Laterality: N/A;  210 - moved to 3/10 @ 11:55 - Ann notified pt   COLONOSCOPY WITH PROPOFOL N/A 02/26/2021   Procedure: COLONOSCOPY WITH PROPOFOL;  Surgeon: Dolores Frame, MD;  [facility]: [facility];  Service: Gastroenterology;  Laterality: N/A;  10:00   ESOPHAGOGASTRODUODENOSCOPY     ESOPHAGOGASTRODUODENOSCOPY (EGD) WITH PROPOFOL N/A 02/26/2021   Procedure: ESOPHAGOGASTRODUODENOSCOPY (EGD) WITH PROPOFOL;  Surgeon: Dolores Frame, MD;  [facility];  Service: Gastroenterology;  Laterality: N/A;   HERNIA REPAIR     LIPOSUCTION     PANNICULECTOMY  01/14/2015   PANNICULECTOMY N/A 01/14/2015   Procedure:  TOTAL PANNICULECTOMY WTH LIPOSUCTION OF SIDES;  Surgeon: Louisa Second, MD;  Location: [facility];  Service: Plastics;  Laterality: N/A;   POLYPECTOMY  02/26/2021  Procedure: POLYPECTOMY INTESTINAL;  Surgeon: Marguerita [name], Reuel Boom, MD;  Location: [facility];  Service: Gastroenterology;;   TONSILLECTOMY     TOTAL HIP ARTHROPLASTY Left 12/16/2021   Procedure: TOTAL HIP ARTHROPLASTY ANTERIOR APPROACH;  Surgeon: Sheral Apley, MD;  Location: [facility];  Service: Orthopedics;  Laterality: Left;   TOTAL HIP ARTHROPLASTY Right 03/03/2022   Procedure: TOTAL HIP ARTHROPLASTY ANTERIOR APPROACH;  Surgeon: Sheral Apley, MD;  Location: [facility];  Service: Orthopedics;  Laterality: Right;   VENTRAL HERNIA REPAIR  01/14/2015   VENTRAL HERNIA REPAIR N/A 01/14/2015   Procedure: HERNIA REPAIR VENTRAL ADULT AND  MUSCLE REPAIR;  Surgeon: Louisa Second, MD;  Location: [facility];  Service: Plastics;  Laterality: N/A;   Social History   Tobacco Use   Smoking status: Never   Smokeless tobacco: Never  Vaping Use   Vaping status: Never Used  Substance Use Topics   Alcohol use: No   Drug use: No   Family History  Problem Relation Age of Onset   COPD Mother    Depression Mother    Hyperlipidemia Mother    Hypertension Mother    Heart disease Mother    Thyroid disease Mother    Anxiety disorder Mother    Diabetes Father    Pulmonary embolism Father 64       blood clot   Hyperlipidemia Father    Sudden death Father    Obesity Father    Eating disorder Father    Hypertension Brother    No Known Allergies  Review of Systems  Constitutional:  Negative for chills and fever.  HENT:  Negative for sore throat.   Respiratory:  Negative for cough and shortness of breath.   Cardiovascular:  Negative for chest pain, palpitations and leg swelling.  Gastrointestinal:  Negative for abdominal pain, blood in stool, constipation, diarrhea, nausea and vomiting.  Genitourinary:  Negative for dysuria and hematuria.  Musculoskeletal:  Negative for myalgias.  Skin:  Negative for itching and rash.  Neurological:  Negative for dizziness and headaches.  Psychiatric/Behavioral:  Negative for depression and suicidal ideas. The patient is nervous/anxious.        Feeling overwhelmed, increase stress, easily agitated     Objective:     BP 134/84 (BP Location: Right Arm, Patient Position: Sitting, Cuff Size: Large)   Pulse (!) 109   Ht 5\' 9"  (1.753 m)   Wt 249 lb 3.2 oz (113 kg)   SpO2 97%   BMI 36.80 kg/m  BP Readings from Last 3 Encounters:  07/08/23 134/84  04/07/23 118/62  02/26/23 136/70   Physical Exam Vitals reviewed.  Constitutional:      General: He is not in acute distress.    Appearance: Normal appearance. He is obese. He is not ill-appearing.  HENT:     Head: Normocephalic and  atraumatic.     Right Ear: External ear normal.     Left Ear: External ear normal.     Nose: Nose normal. No congestion or rhinorrhea.     Mouth/Throat:     Mouth: Mucous membranes are moist.     Pharynx: Oropharynx is clear.  Eyes:     General: No scleral icterus.    Extraocular Movements: Extraocular movements intact.     Conjunctiva/sclera: Conjunctivae normal.     Pupils: Pupils are equal, round, and reactive to light.  Cardiovascular:     Rate and Rhythm: Normal rate and regular rhythm.     Pulses: Normal pulses.  Heart sounds: Normal heart sounds. No murmur heard. Pulmonary:     Effort: Pulmonary effort is normal.     Breath sounds: Normal breath sounds. No wheezing, rhonchi or rales.  Abdominal:     General: Abdomen is flat. Bowel sounds are normal. There is no distension.     Palpations: Abdomen is soft.     Tenderness: There is no abdominal tenderness.  Musculoskeletal:        General: No swelling or deformity. Normal range of motion.     Cervical back: Normal range of motion.  Skin:    General: Skin is warm and dry.     Capillary Refill: Capillary refill takes less than 2 seconds.  Neurological:     General: No focal deficit present.     Mental Status: He is alert and oriented to person, place, and time.     Motor: No weakness.  Psychiatric:        Mood and Affect: Mood normal.        Behavior: Behavior normal.        Thought Content: Thought content normal.   Last CBC Lab Results  Component Value Date   WBC 8.8 02/18/2022   HGB 14.8 02/18/2022   HCT 44.7 02/18/2022   MCV 94.7 02/18/2022   MCH 31.4 02/18/2022   RDW 13.4 02/18/2022   PLT 214 02/18/2022   Last metabolic panel Lab Results  Component Value Date   GLUCOSE 116 (H) 05/20/2022   NA 140 05/20/2022   K 4.1 05/20/2022   CL 101 05/20/2022   CO2 23 05/20/2022   BUN 21 05/20/2022   CREATININE 1.82 (H) 05/20/2022   EGFR 44 (L) 05/20/2022   CALCIUM 9.7 05/20/2022   PROT 6.8 05/20/2022    ALBUMIN 4.1 05/20/2022   LABGLOB 2.7 05/20/2022   AGRATIO 1.5 05/20/2022   BILITOT 0.6 05/20/2022   ALKPHOS 124 (H) 05/20/2022   AST 26 05/20/2022   ALT 19 05/20/2022   ANIONGAP 8 02/18/2022   Last lipids Lab Results  Component Value Date   CHOL 146 01/05/2023   HDL 37 (L) 01/05/2023   LDLCALC 78 01/05/2023   TRIG 183 (H) 01/05/2023   CHOLHDL 3.9 01/05/2023   Last hemoglobin A1c Lab Results  Component Value Date   HGBA1C 6.3 01/05/2023   Last thyroid functions Lab Results  Component Value Date   TSH 1.81 04/13/2019   T3TOTAL 86 02/15/2019   Last vitamin D Lab Results  Component Value Date   VD25OH 57.0 05/20/2022   Last vitamin B12 and Folate Lab Results  Component Value Date   VITAMINB12 815 04/13/2019     Assessment & Plan:   Problem List Items Addressed This Visit       Hypertension - Primary   Remains adequately controlled off antihypertensive medication.      Type 2 diabetes mellitus with diabetic nephropathy (HCC)   A1c 6.3 on labs from August 2024.  He remains on Ozempic 2 mg weekly and Farxiga 5 mg daily.  Repeat A1c and urine microalbumin/creatinine ratio ordered today.      Kidney disease, chronic, stage III (GFR 30-59 ml/min) (HCC)   Followed by nephrology (Dr. Wolfgang Phoenix).  Currently prescribed [place].  Repeat labs ordered today.      Hyperlipidemia   Lipid panel updated in August.  Total cholesterol 146 and LDL 78.  Atorvastatin was increased to 80 mg daily in light of this result.  Repeat lipid panel ordered today.  Vitamin D deficiency   Noted on previous labs.  Repeat vitamin D level ordered today.      Insomnia   Symptoms are adequately controlled with nightly use of Lunesta.      Anxiety   His acute concern today is feeling overwhelmed and becoming agitated more easily.  He describes feeling more anxious than usual and is interested in medication for as needed symptom relief.  Through shared decision making,  hydroxyzine 25 mg every 8 hours for anxiety relief was added today.  He will follow-up if symptoms worsen or fail to improve.      Return in about 6 months (around 01/05/2024).   Billie Lade, MD

## 2023-07-09 ENCOUNTER — Encounter: Payer: Self-pay | Admitting: Internal Medicine

## 2023-07-10 LAB — CBC WITH DIFFERENTIAL/PLATELET
Basophils Absolute: 0.1 10*3/uL (ref 0.0–0.2)
Basos: 0 %
EOS (ABSOLUTE): 0.3 10*3/uL (ref 0.0–0.4)
Eos: 2 %
Hematocrit: 51.4 % — ABNORMAL HIGH (ref 37.5–51.0)
Hemoglobin: 17.7 g/dL (ref 13.0–17.7)
Immature Grans (Abs): 0 10*3/uL (ref 0.0–0.1)
Immature Granulocytes: 0 %
Lymphocytes Absolute: 3 10*3/uL (ref 0.7–3.1)
Lymphs: 23 %
MCH: 31.1 pg (ref 26.6–33.0)
MCHC: 34.4 g/dL (ref 31.5–35.7)
MCV: 90 fL (ref 79–97)
Monocytes Absolute: 1.1 10*3/uL — ABNORMAL HIGH (ref 0.1–0.9)
Monocytes: 9 %
Neutrophils Absolute: 8.8 10*3/uL — ABNORMAL HIGH (ref 1.4–7.0)
Neutrophils: 66 %
Platelets: 212 10*3/uL (ref 150–450)
RBC: 5.7 x10E6/uL (ref 4.14–5.80)
RDW: 12.8 % (ref 11.6–15.4)
WBC: 13.4 10*3/uL — ABNORMAL HIGH (ref 3.4–10.8)

## 2023-07-10 LAB — HEMOGLOBIN A1C
Est. average glucose Bld gHb Est-mCnc: 128 mg/dL
Hgb A1c MFr Bld: 6.1 % — ABNORMAL HIGH (ref 4.8–5.6)

## 2023-07-10 LAB — CMP14+EGFR
ALT: 20 [IU]/L (ref 0–44)
AST: 26 [IU]/L (ref 0–40)
Albumin: 4.3 g/dL (ref 3.8–4.9)
Alkaline Phosphatase: 129 [IU]/L — ABNORMAL HIGH (ref 44–121)
BUN/Creatinine Ratio: 13 (ref 9–20)
BUN: 26 mg/dL — ABNORMAL HIGH (ref 6–24)
Bilirubin Total: 0.6 mg/dL (ref 0.0–1.2)
CO2: 17 mmol/L — ABNORMAL LOW (ref 20–29)
Calcium: 9.8 mg/dL (ref 8.7–10.2)
Chloride: 99 mmol/L (ref 96–106)
Creatinine, Ser: 2.05 mg/dL — ABNORMAL HIGH (ref 0.76–1.27)
Globulin, Total: 3 g/dL (ref 1.5–4.5)
Glucose: 118 mg/dL — ABNORMAL HIGH (ref 70–99)
Potassium: 4.1 mmol/L (ref 3.5–5.2)
Sodium: 138 mmol/L (ref 134–144)
Total Protein: 7.3 g/dL (ref 6.0–8.5)
eGFR: 38 mL/min/{1.73_m2} — ABNORMAL LOW (ref >59)

## 2023-07-10 LAB — TSH+FREE T4
Free T4: 1.3 ng/dL (ref 0.82–1.77)
TSH: 2.29 u[IU]/mL (ref 0.450–4.500)

## 2023-07-10 LAB — B12 AND FOLATE PANEL
Folate: >20 ng/mL (ref >3.0)
Vitamin B-12: 947 pg/mL (ref 232–1245)

## 2023-07-10 LAB — VITAMIN D 25 HYDROXY (VIT D DEFICIENCY, FRACTURES): Vit D, 25-Hydroxy: 60.2 ng/mL (ref 30.0–100.0)

## 2023-07-10 LAB — MICROALBUMIN / CREATININE URINE RATIO
Creatinine, Urine: 98.7 mg/dL
Microalb/Creat Ratio: 119 mg/g{creat} — ABNORMAL HIGH (ref 0–29)
Microalbumin, Urine: 117.1 ug/mL

## 2023-07-10 LAB — LIPID PANEL
Chol/HDL Ratio: 4 {ratio} (ref 0.0–5.0)
Cholesterol, Total: 157 mg/dL (ref 100–199)
HDL: 39 mg/dL — ABNORMAL LOW (ref >39)
LDL Chol Calc (NIH): 57 mg/dL (ref 0–99)
Triglycerides: 398 mg/dL — ABNORMAL HIGH (ref 0–149)
VLDL Cholesterol Cal: 61 mg/dL — ABNORMAL HIGH (ref 5–40)

## 2023-07-21 ENCOUNTER — Other Ambulatory Visit: Payer: Self-pay | Admitting: Obstetrics and Gynecology

## 2023-07-21 NOTE — Patient Instructions (Signed)
 Visit Information  Mr. [name]  - as a part of your Medicaid benefit, you are eligible for care management and care coordination services at no cost or copay. I was unable to reach you by phone today but would be happy to help you with your health related needs. Please feel free to call me at [phone].  Kathi Der RN, BSN, [employer] [facility] Health RN Care Manager Direct Dial [phone]/EPP [phone] Website: [email or web address]

## 2023-07-21 NOTE — Patient Outreach (Cosign Needed Addendum)
  Medicaid Managed Care   Unsuccessful Attempt Note   07/21/2023 Name: [name] MRN: [id number] DOB: 1971-01-30  Referred by: Billie Lade, MD Reason for referral : High Risk Managed Medicaid (Unsuccessful telephone outreach)  Third unsuccessful telephone outreach was attempted today. The patient was referred to the case management team for assistance with care management and care coordination. The patient's primary care provider has been notified of our unsuccessful attempts to make or maintain contact with the patient. The care management team is pleased to engage with this patient at any time in the future should he/she be interested in assistance from the care management team.    Follow Up Plan: The  Patient has been provided with contact information for the Managed Medicaid care management team and has been advised to call with any health related questions or concerns. and The Managed Medicaid care management team is available to follow up with the patient after provider conversation with the patient regarding recommendation for care management engagement and subsequent re-referral to the care management team.    Kathi Der RN, BSN, [facility] Health RN Care Manager Direct Dial [phone] Website: [email or web address]

## 2023-07-30 DIAGNOSIS — M51369 Other intervertebral disc degeneration, lumbar region without mention of lumbar back pain or lower extremity pain: Secondary | ICD-10-CM | POA: Diagnosis not present

## 2023-07-30 DIAGNOSIS — E6609 Other obesity due to excess calories: Secondary | ICD-10-CM | POA: Diagnosis not present

## 2023-07-30 DIAGNOSIS — Z79899 Other long term (current) drug therapy: Secondary | ICD-10-CM | POA: Diagnosis not present

## 2023-07-30 DIAGNOSIS — M160 Bilateral primary osteoarthritis of hip: Secondary | ICD-10-CM | POA: Diagnosis not present

## 2023-07-30 DIAGNOSIS — Z6837 Body mass index (BMI) 37.0-37.9, adult: Secondary | ICD-10-CM | POA: Diagnosis not present

## 2023-07-30 DIAGNOSIS — M62830 Muscle spasm of back: Secondary | ICD-10-CM | POA: Diagnosis not present

## 2023-08-02 ENCOUNTER — Other Ambulatory Visit: Payer: Self-pay | Admitting: Internal Medicine

## 2023-08-02 DIAGNOSIS — G4700 Insomnia, unspecified: Secondary | ICD-10-CM

## 2023-08-04 ENCOUNTER — Other Ambulatory Visit: Payer: Self-pay | Admitting: Internal Medicine

## 2023-08-04 DIAGNOSIS — E1121 Type 2 diabetes mellitus with diabetic nephropathy: Secondary | ICD-10-CM

## 2023-08-16 ENCOUNTER — Other Ambulatory Visit: Payer: Self-pay | Admitting: Internal Medicine

## 2023-08-16 DIAGNOSIS — E1121 Type 2 diabetes mellitus with diabetic nephropathy: Secondary | ICD-10-CM

## 2023-08-30 ENCOUNTER — Other Ambulatory Visit: Payer: Self-pay | Admitting: Internal Medicine

## 2023-08-30 DIAGNOSIS — E6609 Other obesity due to excess calories: Secondary | ICD-10-CM | POA: Diagnosis not present

## 2023-08-30 DIAGNOSIS — E1122 Type 2 diabetes mellitus with diabetic chronic kidney disease: Secondary | ICD-10-CM | POA: Diagnosis not present

## 2023-08-30 DIAGNOSIS — Z79899 Other long term (current) drug therapy: Secondary | ICD-10-CM | POA: Diagnosis not present

## 2023-08-30 DIAGNOSIS — M62830 Muscle spasm of back: Secondary | ICD-10-CM | POA: Diagnosis not present

## 2023-08-30 DIAGNOSIS — Z6837 Body mass index (BMI) 37.0-37.9, adult: Secondary | ICD-10-CM | POA: Diagnosis not present

## 2023-08-30 DIAGNOSIS — N183 Chronic kidney disease, stage 3 unspecified: Secondary | ICD-10-CM | POA: Diagnosis not present

## 2023-08-30 DIAGNOSIS — E1121 Type 2 diabetes mellitus with diabetic nephropathy: Secondary | ICD-10-CM | POA: Diagnosis not present

## 2023-08-30 DIAGNOSIS — F419 Anxiety disorder, unspecified: Secondary | ICD-10-CM

## 2023-08-30 DIAGNOSIS — G4700 Insomnia, unspecified: Secondary | ICD-10-CM

## 2023-08-30 DIAGNOSIS — M51369 Other intervertebral disc degeneration, lumbar region without mention of lumbar back pain or lower extremity pain: Secondary | ICD-10-CM | POA: Diagnosis not present

## 2023-08-30 DIAGNOSIS — M160 Bilateral primary osteoarthritis of hip: Secondary | ICD-10-CM | POA: Diagnosis not present

## 2023-08-31 ENCOUNTER — Telehealth: Payer: Self-pay | Admitting: Pharmacy Technician

## 2023-08-31 ENCOUNTER — Other Ambulatory Visit (HOSPITAL_COMMUNITY): Payer: Self-pay

## 2023-08-31 NOTE — Telephone Encounter (Signed)
 Pharmacy Patient Advocate Encounter   Received notification from CoverMyMeds that prior authorization for Eszopiclone 2MG  tablets is required/requested.   Insurance verification completed.   The patient is insured through [facility] .   Per test claim: PA required; PA submitted to above mentioned insurance via CoverMyMeds Key/confirmation #/EOC [id number] Status is pending

## 2023-08-31 NOTE — Telephone Encounter (Signed)
 Pharmacy Patient Advocate Encounter  Received notification from [facility] that Prior Authorization for Eszopiclone 2MG  tablets has been APPROVED from 08/31/2023 to 10/05/205. Ran test claim, Copay is $4.00. This test claim was processed through [facility]- copay amounts may vary at other pharmacies due to pharmacy/plan contracts, or as the patient moves through the different stages of their insurance plan.   PA #/Case ID/Reference #: [id number]

## 2023-09-01 ENCOUNTER — Other Ambulatory Visit: Payer: Self-pay | Admitting: Internal Medicine

## 2023-09-01 DIAGNOSIS — E1121 Type 2 diabetes mellitus with diabetic nephropathy: Secondary | ICD-10-CM

## 2023-09-06 DIAGNOSIS — N1832 Chronic kidney disease, stage 3b: Secondary | ICD-10-CM | POA: Diagnosis not present

## 2023-09-06 DIAGNOSIS — R809 Proteinuria, unspecified: Secondary | ICD-10-CM | POA: Diagnosis not present

## 2023-09-06 DIAGNOSIS — E1129 Type 2 diabetes mellitus with other diabetic kidney complication: Secondary | ICD-10-CM | POA: Diagnosis not present

## 2023-09-06 DIAGNOSIS — E1122 Type 2 diabetes mellitus with diabetic chronic kidney disease: Secondary | ICD-10-CM | POA: Diagnosis not present

## 2023-09-27 ENCOUNTER — Other Ambulatory Visit: Payer: Self-pay | Admitting: Internal Medicine

## 2023-09-27 DIAGNOSIS — G4700 Insomnia, unspecified: Secondary | ICD-10-CM

## 2023-09-28 DIAGNOSIS — M51369 Other intervertebral disc degeneration, lumbar region without mention of lumbar back pain or lower extremity pain: Secondary | ICD-10-CM | POA: Diagnosis not present

## 2023-09-28 DIAGNOSIS — M62830 Muscle spasm of back: Secondary | ICD-10-CM | POA: Diagnosis not present

## 2023-09-28 DIAGNOSIS — Z6838 Body mass index (BMI) 38.0-38.9, adult: Secondary | ICD-10-CM | POA: Diagnosis not present

## 2023-09-28 DIAGNOSIS — N1832 Chronic kidney disease, stage 3b: Secondary | ICD-10-CM | POA: Diagnosis not present

## 2023-09-28 DIAGNOSIS — E1121 Type 2 diabetes mellitus with diabetic nephropathy: Secondary | ICD-10-CM | POA: Diagnosis not present

## 2023-09-28 DIAGNOSIS — M160 Bilateral primary osteoarthritis of hip: Secondary | ICD-10-CM | POA: Diagnosis not present

## 2023-09-28 DIAGNOSIS — E6609 Other obesity due to excess calories: Secondary | ICD-10-CM | POA: Diagnosis not present

## 2023-09-29 ENCOUNTER — Other Ambulatory Visit: Payer: Self-pay | Admitting: Internal Medicine

## 2023-09-29 DIAGNOSIS — E1121 Type 2 diabetes mellitus with diabetic nephropathy: Secondary | ICD-10-CM

## 2023-10-28 ENCOUNTER — Other Ambulatory Visit: Payer: Self-pay | Admitting: Internal Medicine

## 2023-10-28 DIAGNOSIS — G4700 Insomnia, unspecified: Secondary | ICD-10-CM

## 2023-10-28 DIAGNOSIS — E1121 Type 2 diabetes mellitus with diabetic nephropathy: Secondary | ICD-10-CM

## 2023-10-29 DIAGNOSIS — E6609 Other obesity due to excess calories: Secondary | ICD-10-CM | POA: Diagnosis not present

## 2023-10-29 DIAGNOSIS — N1832 Chronic kidney disease, stage 3b: Secondary | ICD-10-CM | POA: Diagnosis not present

## 2023-10-29 DIAGNOSIS — E1121 Type 2 diabetes mellitus with diabetic nephropathy: Secondary | ICD-10-CM | POA: Diagnosis not present

## 2023-10-29 DIAGNOSIS — Z6838 Body mass index (BMI) 38.0-38.9, adult: Secondary | ICD-10-CM | POA: Diagnosis not present

## 2023-10-29 DIAGNOSIS — M160 Bilateral primary osteoarthritis of hip: Secondary | ICD-10-CM | POA: Diagnosis not present

## 2023-10-29 DIAGNOSIS — M62830 Muscle spasm of back: Secondary | ICD-10-CM | POA: Diagnosis not present

## 2023-10-29 DIAGNOSIS — M51369 Other intervertebral disc degeneration, lumbar region without mention of lumbar back pain or lower extremity pain: Secondary | ICD-10-CM | POA: Diagnosis not present

## 2023-11-02 DIAGNOSIS — Z79899 Other long term (current) drug therapy: Secondary | ICD-10-CM | POA: Diagnosis not present

## 2023-11-04 ENCOUNTER — Other Ambulatory Visit: Payer: Self-pay | Admitting: Internal Medicine

## 2023-11-04 DIAGNOSIS — E1121 Type 2 diabetes mellitus with diabetic nephropathy: Secondary | ICD-10-CM

## 2023-11-24 ENCOUNTER — Other Ambulatory Visit: Payer: Self-pay | Admitting: Internal Medicine

## 2023-11-24 DIAGNOSIS — E1121 Type 2 diabetes mellitus with diabetic nephropathy: Secondary | ICD-10-CM

## 2023-11-29 ENCOUNTER — Other Ambulatory Visit: Payer: Self-pay | Admitting: Internal Medicine

## 2023-11-29 DIAGNOSIS — F419 Anxiety disorder, unspecified: Secondary | ICD-10-CM

## 2023-11-29 DIAGNOSIS — G4700 Insomnia, unspecified: Secondary | ICD-10-CM

## 2023-11-30 DIAGNOSIS — N1832 Chronic kidney disease, stage 3b: Secondary | ICD-10-CM | POA: Diagnosis not present

## 2023-11-30 DIAGNOSIS — M51369 Other intervertebral disc degeneration, lumbar region without mention of lumbar back pain or lower extremity pain: Secondary | ICD-10-CM | POA: Diagnosis not present

## 2023-11-30 DIAGNOSIS — N183 Chronic kidney disease, stage 3 unspecified: Secondary | ICD-10-CM | POA: Diagnosis not present

## 2023-11-30 DIAGNOSIS — Z6838 Body mass index (BMI) 38.0-38.9, adult: Secondary | ICD-10-CM | POA: Diagnosis not present

## 2023-11-30 DIAGNOSIS — M160 Bilateral primary osteoarthritis of hip: Secondary | ICD-10-CM | POA: Diagnosis not present

## 2023-11-30 DIAGNOSIS — E6609 Other obesity due to excess calories: Secondary | ICD-10-CM | POA: Diagnosis not present

## 2023-11-30 DIAGNOSIS — M62830 Muscle spasm of back: Secondary | ICD-10-CM | POA: Diagnosis not present

## 2023-11-30 DIAGNOSIS — E1122 Type 2 diabetes mellitus with diabetic chronic kidney disease: Secondary | ICD-10-CM | POA: Diagnosis not present

## 2023-12-22 ENCOUNTER — Other Ambulatory Visit: Payer: Self-pay

## 2023-12-22 DIAGNOSIS — E1121 Type 2 diabetes mellitus with diabetic nephropathy: Secondary | ICD-10-CM

## 2023-12-27 ENCOUNTER — Other Ambulatory Visit: Payer: Self-pay

## 2023-12-27 DIAGNOSIS — G4700 Insomnia, unspecified: Secondary | ICD-10-CM

## 2024-01-05 ENCOUNTER — Ambulatory Visit: Payer: Medicaid Other | Admitting: Internal Medicine

## 2024-01-11 DIAGNOSIS — M51369 Other intervertebral disc degeneration, lumbar region without mention of lumbar back pain or lower extremity pain: Secondary | ICD-10-CM | POA: Diagnosis not present

## 2024-01-11 DIAGNOSIS — Z6837 Body mass index (BMI) 37.0-37.9, adult: Secondary | ICD-10-CM | POA: Diagnosis not present

## 2024-01-11 DIAGNOSIS — E1121 Type 2 diabetes mellitus with diabetic nephropathy: Secondary | ICD-10-CM | POA: Diagnosis not present

## 2024-01-11 DIAGNOSIS — N1832 Chronic kidney disease, stage 3b: Secondary | ICD-10-CM | POA: Diagnosis not present

## 2024-01-11 DIAGNOSIS — E6609 Other obesity due to excess calories: Secondary | ICD-10-CM | POA: Diagnosis not present

## 2024-01-17 ENCOUNTER — Ambulatory Visit: Payer: Self-pay | Admitting: *Deleted

## 2024-01-17 ENCOUNTER — Ambulatory Visit

## 2024-01-17 VITALS — BP 116/79 | HR 91 | Ht 69.0 in | Wt 254.0 lb

## 2024-01-17 DIAGNOSIS — R1900 Intra-abdominal and pelvic swelling, mass and lump, unspecified site: Secondary | ICD-10-CM

## 2024-01-17 NOTE — Telephone Encounter (Signed)
 Appt made.

## 2024-01-17 NOTE — Telephone Encounter (Signed)
 Copied from CRM #[id number]. Topic: Clinical - Red Word Triage >> Jan 17, 2024 10:22 AM Willma SAUNDERS wrote: Red Word that prompted transfer to Nurse Triage: Patient thinks he may have pulled his groin or has a hernia. There is a lump and when he moves a certain way there is pain. Reason for Disposition  [1] Swelling goes away when you push on it AND [2] no pain  Answer Assessment - Initial Assessment Questions 1. SCROTAL SWELLING: What does the scrotum look like? How swollen is it? (mild, moderate severe; compare to other side)     I'm having a lump in my left groin.   It's semi hard.   I had weight lose surgery and had a hernia that was repaired by my belly button.    2. LOCATION: Where is the swelling located?     Left groin.   It hurts when I cough or sneeze.     3. ONSET: When did the swelling start?      A couple of months ago.    4. PATTERN: Does it come and go, or has it been constant since it started?     It gets soft and hard intermittently.    Sometimes constipated.    I take fiber for that. 5. SCROTAL PAIN: Is there any pain? If Yes, ask: How bad is it?  (Scale 1-10; or mild, moderate, severe)     No scrotal pain or swelling.   Just in the groin area.    No urinary symptoms.   6. HERNIA: Has a doctor ever told you that you have a hernia?     Not in this area.   I had a hernia after weight loss surgery they repaired around my belly button.   7. OTHER SYMPTOMS: Do you have any other symptoms? (e.g., abdomen pain, difficulty passing urine, fever, vomiting)     No  Protocols used: Scrotum Swelling-A-AH FYI Only or Action Required?: FYI only for provider.  Patient was last seen in primary care on 07/08/2023 by Melvenia Manus BRAVO, MD.  Called Nurse Triage reporting Groin Pain.Left groin lump that is semi hard and sore.  Symptoms began about a month ago.  Interventions attempted: Nothing.  Symptoms are: gradually worsening.  Triage Disposition: See PCP When Office  is Open (Within 3 Days)  Patient/caregiver understands and will follow disposition?: Yes

## 2024-01-17 NOTE — Progress Notes (Unsigned)
 Established Patient Office Visit  Subjective   Patient ID: [name], male    DOB: 1971-01-10  Age: 53 y.o. MRN: [id number]  Chief Complaint  Patient presents with   Medical Management of Chronic Issues    Lump in left side of groin that is semi hard and been sore for the past month     HPI  Patient Active Problem List   Diagnosis Date Noted   Anxiety 07/08/2023   Encounter for well adult exam with abnormal findings 04/07/2023   Insomnia 05/26/2022   Elevated alkaline phosphatase level 05/26/2022   S/P total right hip arthroplasty 03/03/2022   Annual visit for general adult medical examination with abnormal findings 02/17/2022   S/P total left hip arthroplasty 12/16/2021   Need for varicella vaccine 10/16/2021   OSA  09/11/2021   Pre-operative clearance 06/19/2021   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   Immunization due 02/06/2021   Iron deficiency anemia 12/30/2020   History of colonic polyps 12/30/2020   Unilateral primary osteoarthritis, right hip 08/13/2020   Unilateral primary osteoarthritis, left hip 08/13/2020   Muscle spasms of both lower extremities 05/23/2020   Need for immunization against influenza 03/06/2020   Primary osteoarthritis of both hips 07/10/2019   Bilateral primary osteoarthritis of hip 07/10/2019   Vitamin D  deficiency 04/13/2019   Morbid obesity with body mass index (BMI) of 40.0 to 44.9 in adult [facility]) 04/13/2019   Depressive disorder 04/13/2019   Body mass index (BMI) 45.0-49.9, adult (HCC) 04/13/2019   Erectile dysfunction 01/26/2018   Kidney disease, chronic, stage III (GFR 30-59 ml/min) (HCC) 07/20/2017   History of cerebrovascular accident 07/20/2017   Gastroesophageal reflux disease 07/20/2017   Gastric bypass status for obesity 02/11/2017   Benzodiazepine dependence (HCC) 02/11/2017   Type 2 diabetes mellitus with diabetic nephropathy (HCC) 08/03/2008   Hyperlipidemia 08/03/2008    Hypertension 08/03/2008   Sleep apnea 08/03/2008   Sleep apnea 08/03/2008      ROS    Objective:     BP 116/79   Pulse 91   Ht 5' 9 (1.753 m)   Wt 254 lb (115.2 kg)   SpO2 96%   BMI 37.51 kg/m  BP Readings from Last 3 Encounters:  01/17/24 116/79  07/08/23 134/84  04/07/23 118/62   Wt Readings from Last 3 Encounters:  01/17/24 254 lb (115.2 kg)  07/08/23 249 lb 3.2 oz (113 kg)  04/07/23 254 lb 6.4 oz (115.4 kg)     Physical Exam Vitals and nursing note reviewed.  Constitutional:      Appearance: Normal appearance.  HENT:     Head: Normocephalic.  Eyes:     Extraocular Movements: Extraocular movements intact.     Pupils: Pupils are equal, round, and reactive to light.  Cardiovascular:     Rate and Rhythm: Normal rate and regular rhythm.  Pulmonary:     Effort: Pulmonary effort is normal.     Breath sounds: Normal breath sounds.  Musculoskeletal:     Cervical back: Normal range of motion and neck supple.  Neurological:     Mental Status: He is alert and oriented to person, place, and time.  Psychiatric:        Mood and Affect: Mood normal.        Thought Content: Thought content normal.    No results found for any visits on 01/17/24.  {Labs (Optional):23779}  The ASCVD Risk score (Arnett DK, et al., 2019) failed  to calculate for the following reasons:   Risk score cannot be calculated because patient has a medical history suggesting prior/existing ASCVD    Assessment & Plan:   Problem List Items Addressed This Visit   None   No follow-ups on file.    Leita Longs, FNP

## 2024-01-19 ENCOUNTER — Other Ambulatory Visit: Payer: Self-pay

## 2024-01-19 DIAGNOSIS — E1121 Type 2 diabetes mellitus with diabetic nephropathy: Secondary | ICD-10-CM

## 2024-01-20 ENCOUNTER — Other Ambulatory Visit: Payer: Self-pay | Admitting: Internal Medicine

## 2024-01-20 DIAGNOSIS — E782 Mixed hyperlipidemia: Secondary | ICD-10-CM

## 2024-01-21 DIAGNOSIS — R1900 Intra-abdominal and pelvic swelling, mass and lump, unspecified site: Secondary | ICD-10-CM | POA: Insufficient documentation

## 2024-01-21 NOTE — Assessment & Plan Note (Signed)
 Swelling and burning sensation in the abdominal area for about a month. Heavy weight lifting may have contributed to the hernia.  - Order CT scan to evaluate the hernia.

## 2024-01-27 ENCOUNTER — Other Ambulatory Visit: Payer: Self-pay

## 2024-01-27 DIAGNOSIS — G4700 Insomnia, unspecified: Secondary | ICD-10-CM

## 2024-01-28 ENCOUNTER — Other Ambulatory Visit: Payer: Self-pay

## 2024-02-03 ENCOUNTER — Encounter (INDEPENDENT_AMBULATORY_CARE_PROVIDER_SITE_OTHER): Payer: Self-pay | Admitting: *Deleted

## 2024-02-10 DIAGNOSIS — M51369 Other intervertebral disc degeneration, lumbar region without mention of lumbar back pain or lower extremity pain: Secondary | ICD-10-CM | POA: Diagnosis not present

## 2024-02-10 DIAGNOSIS — E6609 Other obesity due to excess calories: Secondary | ICD-10-CM | POA: Diagnosis not present

## 2024-02-10 DIAGNOSIS — Z6837 Body mass index (BMI) 37.0-37.9, adult: Secondary | ICD-10-CM | POA: Diagnosis not present

## 2024-02-10 DIAGNOSIS — E785 Hyperlipidemia, unspecified: Secondary | ICD-10-CM | POA: Diagnosis not present

## 2024-02-10 DIAGNOSIS — E1121 Type 2 diabetes mellitus with diabetic nephropathy: Secondary | ICD-10-CM | POA: Diagnosis not present

## 2024-02-10 DIAGNOSIS — N1832 Chronic kidney disease, stage 3b: Secondary | ICD-10-CM | POA: Diagnosis not present

## 2024-02-15 ENCOUNTER — Other Ambulatory Visit: Payer: Self-pay | Admitting: Internal Medicine

## 2024-02-15 ENCOUNTER — Other Ambulatory Visit: Payer: Self-pay

## 2024-02-15 DIAGNOSIS — E1121 Type 2 diabetes mellitus with diabetic nephropathy: Secondary | ICD-10-CM

## 2024-02-15 DIAGNOSIS — F419 Anxiety disorder, unspecified: Secondary | ICD-10-CM

## 2024-02-21 ENCOUNTER — Ambulatory Visit (HOSPITAL_COMMUNITY)
Admission: RE | Admit: 2024-02-21 | Discharge: 2024-02-21 | Disposition: A | Discharge status: Home / Self Care | Source: Ambulatory Visit

## 2024-02-21 DIAGNOSIS — R1900 Intra-abdominal and pelvic swelling, mass and lump, unspecified site: Secondary | ICD-10-CM | POA: Insufficient documentation

## 2024-02-21 DIAGNOSIS — Z0001 Encounter for general adult medical examination with abnormal findings: Secondary | ICD-10-CM | POA: Insufficient documentation

## 2024-02-21 MED ORDER — IOHEXOL 9 MG/ML PO SOLN
ORAL | Status: AC
  Filled 2024-02-21: qty 1000

## 2024-02-21 MED ORDER — IOHEXOL 9 MG/ML PO SOLN
500.0000 mL | ORAL | Status: AC
  Administered 2024-02-21: 500 mL via ORAL

## 2024-02-21 MED ORDER — IOHEXOL 300 MG/ML  SOLN
100.0000 mL | Freq: Once | INTRAMUSCULAR | Status: AC | PRN
Start: 2024-02-21 — End: 2024-02-21
  Administered 2024-02-21: 80 mL via INTRAVENOUS

## 2024-02-25 ENCOUNTER — Other Ambulatory Visit: Payer: Self-pay

## 2024-02-25 DIAGNOSIS — G4700 Insomnia, unspecified: Secondary | ICD-10-CM

## 2024-02-28 ENCOUNTER — Encounter: Payer: Self-pay | Admitting: Internal Medicine

## 2024-02-28 ENCOUNTER — Telehealth: Payer: Self-pay

## 2024-02-28 ENCOUNTER — Telehealth: Payer: Self-pay | Admitting: Pharmacy Technician

## 2024-02-28 ENCOUNTER — Other Ambulatory Visit (HOSPITAL_COMMUNITY): Payer: Self-pay

## 2024-02-28 NOTE — Telephone Encounter (Signed)
 Copied from CRM #[id number]. Topic: Clinical - Medication Prior Auth >> Feb 28, 2024  1:59 PM Willma R wrote: Reason for CRM: Patient states the pharmacy advised he needs prior auth for prescription eszopiclone  (LUNESTA ) 2 MG TABS tablet.  Patient can be reached at [phone]

## 2024-02-28 NOTE — Telephone Encounter (Signed)
 Pharmacy Patient Advocate Encounter  Received notification from [employer] that Prior Authorization for Eszopiclone  2MG  tablets has been APPROVED from 02/28/2024 to 08/26/2024. Ran test claim, Copay is $4.00. This test claim was processed through [facility]- copay amounts may vary at other pharmacies due to pharmacy/plan contracts, or as the patient moves through the different stages of their insurance plan.   PA #/Case ID/Reference #: [id number]

## 2024-02-28 NOTE — Telephone Encounter (Signed)
 Copied from CRM [id number]. Topic: Clinical - Lab/Test Results >> Feb 28, 2024  1:56 PM Willma R wrote: Reason for CRM: Patient inquiring about the results of his CT scan. Is requesting a call back once reviewed to discuss.   Patient can be reached at [phone]

## 2024-02-28 NOTE — Telephone Encounter (Signed)
 Pharmacy Patient Advocate Encounter   Received notification from CoverMyMeds that prior authorization for Eszopiclone  2MG  tablets is required/requested.   Insurance verification completed.   The patient is insured through [employer].   Per test claim: PA required; PA submitted to above mentioned insurance via Latent Key/confirmation #/EOC [id number] Status is pending

## 2024-02-28 NOTE — Telephone Encounter (Signed)
 PA request has been Approved. New Encounter has been or will be created for follow up. For additional info see Pharmacy Prior Auth telephone encounter from 02/28/2024.

## 2024-02-29 ENCOUNTER — Other Ambulatory Visit: Payer: Self-pay

## 2024-02-29 ENCOUNTER — Ambulatory Visit: Payer: Self-pay

## 2024-02-29 DIAGNOSIS — K4091 Unilateral inguinal hernia, without obstruction or gangrene, recurrent: Secondary | ICD-10-CM

## 2024-02-29 DIAGNOSIS — K429 Umbilical hernia without obstruction or gangrene: Secondary | ICD-10-CM

## 2024-02-29 DIAGNOSIS — K7460 Unspecified cirrhosis of liver: Secondary | ICD-10-CM

## 2024-02-29 LAB — POCT I-STAT CREATININE: Creatinine, Ser: 2.1 mg/dL — ABNORMAL HIGH (ref 0.61–1.24)

## 2024-02-29 NOTE — Progress Notes (Signed)
Oh

## 2024-03-01 ENCOUNTER — Encounter (INDEPENDENT_AMBULATORY_CARE_PROVIDER_SITE_OTHER): Payer: Self-pay | Admitting: *Deleted

## 2024-03-03 ENCOUNTER — Ambulatory Visit (INDEPENDENT_AMBULATORY_CARE_PROVIDER_SITE_OTHER)

## 2024-03-03 DIAGNOSIS — Z23 Encounter for immunization: Secondary | ICD-10-CM

## 2024-03-03 NOTE — Progress Notes (Signed)
 Patient is in office today for a nurse visit for Flu Shot. Patient Injection was given in the  Left deltoid. Patient tolerated injection well.

## 2024-03-10 DIAGNOSIS — Z6837 Body mass index (BMI) 37.0-37.9, adult: Secondary | ICD-10-CM | POA: Diagnosis not present

## 2024-03-10 DIAGNOSIS — E785 Hyperlipidemia, unspecified: Secondary | ICD-10-CM | POA: Diagnosis not present

## 2024-03-10 DIAGNOSIS — E1121 Type 2 diabetes mellitus with diabetic nephropathy: Secondary | ICD-10-CM | POA: Diagnosis not present

## 2024-03-10 DIAGNOSIS — N1832 Chronic kidney disease, stage 3b: Secondary | ICD-10-CM | POA: Diagnosis not present

## 2024-03-10 DIAGNOSIS — M51369 Other intervertebral disc degeneration, lumbar region without mention of lumbar back pain or lower extremity pain: Secondary | ICD-10-CM | POA: Diagnosis not present

## 2024-03-10 DIAGNOSIS — E6609 Other obesity due to excess calories: Secondary | ICD-10-CM | POA: Diagnosis not present

## 2024-03-14 ENCOUNTER — Ambulatory Visit: Admitting: Student

## 2024-03-20 ENCOUNTER — Encounter (INDEPENDENT_AMBULATORY_CARE_PROVIDER_SITE_OTHER): Payer: Self-pay | Admitting: Gastroenterology

## 2024-03-20 ENCOUNTER — Ambulatory Visit (INDEPENDENT_AMBULATORY_CARE_PROVIDER_SITE_OTHER): Admitting: Gastroenterology

## 2024-03-20 ENCOUNTER — Telehealth: Payer: Self-pay

## 2024-03-20 ENCOUNTER — Telehealth (INDEPENDENT_AMBULATORY_CARE_PROVIDER_SITE_OTHER): Payer: Self-pay

## 2024-03-20 VITALS — BP 91/57 | HR 84 | Temp 98.6°F | Ht 69.0 in | Wt 252.6 lb

## 2024-03-20 DIAGNOSIS — K7460 Unspecified cirrhosis of liver: Secondary | ICD-10-CM

## 2024-03-20 DIAGNOSIS — Z860101 Personal history of adenomatous and serrated colon polyps: Secondary | ICD-10-CM

## 2024-03-20 DIAGNOSIS — Z860100 Personal history of colon polyps, unspecified: Secondary | ICD-10-CM | POA: Insufficient documentation

## 2024-03-20 MED ORDER — NA SULFATE-K SULFATE-MG SULF 17.5-3.13-1.6 GM/177ML PO SOLN: ORAL | 0 refills | Status: DC

## 2024-03-20 NOTE — H&P (View-Only) (Signed)
 [name] , M.D. Gastroenterology & Hepatology [facility] [address] [place], [place] [address] Primary Care Physician: Bevely Doffing, FNP [address] [place] [address]  Chief Complaint:  CT finding of Cirrhosis , surveillance colonoscopy  History of Present Illness:  [name] is a 53 y.o. male with history of CKD, chronic anemia, diabetes, depression, chronic narcotic use, stroke, hypertension, hyperlipidemia, history of gout, obesity status post RYGB in 2010 who presents for evaluation of CT finding of Cirrhosis   Patient was last seen by Dr. Eartha in 2022 for iron deficiency anemia.  He underwent bidirectional endoscopy found to have 10 tubular adenoma and a large hiatal hernia (10 cm).  Patient recently had a CT scan for preoperative evaluation of inguinal hernia repair and had questionable liver finding of cirrhosis.  Patient denies alcohol use or any herbal medications The patient denies having any nausea, vomiting, fever, chills, hematochezia, melena, hematemesis, abdominal distention, abdominal pain, diarrhea, jaundice, pruritus or weight loss.  Labs from 06/2023 creatinine 2.05 ALP 129 AST 26 ALT 20 T. bili 0.6 Hemoglobin 17.7 Hemoglobin A1c 6.1  Last EGD:2022  - 10 cm hiatal hernia. - A medium amount of food ( residue) in the stomach. Small gastric pouch. Unclear previous bariatric surgery. - Normal examined duodenum. - No specimens collected.  Last Colonoscopy:2022  - Hemorrhoids found on perianal exam. - Ten 3 to 10 mm polyps in the sigmoid colon, in the descending colon, in the transverse colon, in the ascending colon and in the cecum, removed with a cold snare. Resected and retrieved. - Congested mucosa in the transverse colon. Biopsied. - The distal rectum and anal verge are normal on retroflexion view.   A. COLON, ASCENDING, TRANSVERSE, POLYPECTOMY:  - Tubular adenoma(s)  -  Negative for high-grade dysplasia or malignancy   B. COLON, TRANSVERSE, BIOPSY:  - Colonic mucosa with mild nonspecific hyperplastic changes  - Negative for dysplasia   C. COLON, DESCENDING, POLYPECTOMY:  - Tubular adenoma(s)  - Negative for high-grade dysplasia or malignancy   Repeat 3 years   Patient has a history of at least 9 polyps before age 89.   FHx: neg for any gastrointestinal/liver disease,mother bladder cancer Social: neg smoking, alcohol or illicit drug use Surgical:abdominal skin removal and umbilical hernia,RYGB  Past Medical History: Past Medical History:  Diagnosis Date   ALLERGY 08/03/2008   Qualifier: Diagnosis of  By: Bettie Slough     Anemia    Arthritis    hips (01/15/2015)   Blood transfusion without reported diagnosis    Cellulitis 07/25/2018   Chronic back pain greater than 3 months duration 02/11/2017   Chronic lower back pain    Colon polyps    COLONIC POLYPS, ADENOMATOUS 08/03/2008   Qualifier: Diagnosis of  By: Bettie Slough     CVA 08/03/2008   Qualifier: History of  By: Bettie Slough     Depression    denies   Diabetes mellitus    diet controlled- no meds since wt loss   Diabetes mellitus (HCC) 07/10/2019   Dysrhythmia    s/p surgery bariatric- cardioversion   Erectile dysfunction 01/26/2018   GERD (gastroesophageal reflux disease)    HIATAL HERNIA 08/03/2008   Qualifier: Diagnosis of  By: Bettie Slough     Hip pain    History of gout    Hyperlipidemia    Hypertension    Joint pain    Narcotic dependence (HCC) 02/11/2017  Neuromuscular disorder (HCC)    PONV (postoperative nausea and vomiting)    Prediabetes 03/29/2017   Sleep apnea    no longer have to use cpap since wt loss   Stage 3 chronic kidney disease (HCC) 01/26/2018   Stroke (HCC) 2006   denies residual on 01/15/2015   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 08/01/2018    Past Surgical History: Past Surgical History:  Procedure Laterality  Date   BARIATRIC SURGERY  2010   in [place]   BIOPSY  02/26/2021   Procedure: BIOPSY;  Surgeon: Eartha Angelia Sieving, MD;  [facility]: [facility];  Service: Gastroenterology;;   CARDIOVERSION     COLONOSCOPY N/A 08/02/2014   Procedure: COLONOSCOPY;  Surgeon: Claudis RAYMOND Rivet, MD;  Location: [facility];  Service: Endoscopy;  Laterality: N/A;  210 - moved to 3/10 @ 11:55 - Ann notified pt   COLONOSCOPY WITH PROPOFOL  N/A 02/26/2021   Procedure: COLONOSCOPY WITH PROPOFOL ;  Surgeon: Eartha Angelia Sieving, MD;  [facility]: [facility];  Service: Gastroenterology;  Laterality: N/A;  10:00   ESOPHAGOGASTRODUODENOSCOPY     ESOPHAGOGASTRODUODENOSCOPY (EGD) WITH PROPOFOL  N/A 02/26/2021   Procedure: ESOPHAGOGASTRODUODENOSCOPY (EGD) WITH PROPOFOL ;  Surgeon: Eartha Angelia Sieving, MD;  [facility]: [facility];  Service: Gastroenterology;  Laterality: N/A;   HERNIA REPAIR     LIPOSUCTION     PANNICULECTOMY  01/14/2015   PANNICULECTOMY N/A 01/14/2015   Procedure:  TOTAL PANNICULECTOMY WTH LIPOSUCTION OF SIDES;  Surgeon: Elna Pick, MD;  Location: [facility];  Service: Plastics;  Laterality: N/A;   POLYPECTOMY  02/26/2021   Procedure: POLYPECTOMY INTESTINAL;  Surgeon: Eartha Angelia Sieving, MD;  Location: [facility];  Service: Gastroenterology;;   TONSILLECTOMY     TOTAL HIP ARTHROPLASTY Left 12/16/2021   Procedure: TOTAL HIP ARTHROPLASTY ANTERIOR APPROACH;  Surgeon: Beverley Evalene BIRCH, MD;  Location: [facility];  Service: Orthopedics;  Laterality: Left;   TOTAL HIP ARTHROPLASTY Right 03/03/2022   Procedure: TOTAL HIP ARTHROPLASTY ANTERIOR APPROACH;  Surgeon: Beverley Evalene BIRCH, MD;  Location: [facility];  Service: Orthopedics;  Laterality: Right;   VENTRAL HERNIA REPAIR  01/14/2015   VENTRAL HERNIA REPAIR N/A 01/14/2015   Procedure: HERNIA REPAIR VENTRAL ADULT AND MUSCLE REPAIR;  Surgeon: Elna Pick, MD;  Location: [facility];  Service: Plastics;  Laterality: N/A;    Family History: Family  History  Problem Relation Age of Onset   COPD Mother    Depression Mother    Hyperlipidemia Mother    Hypertension Mother    Heart disease Mother    Thyroid  disease Mother    Anxiety disorder Mother    Diabetes Father    Pulmonary embolism Father 68       blood clot   Hyperlipidemia Father    Sudden death Father    Obesity Father    Eating disorder Father    Hypertension Brother     Social History: Social History   Tobacco Use  Smoking Status Never  Smokeless Tobacco Never   Social History   Substance and Sexual Activity  Alcohol Use No   Social History   Substance and Sexual Activity  Drug Use No    Allergies: No Known Allergies  Medications: Current Outpatient Medications  Medication Sig Dispense Refill   aspirin  EC 81 MG tablet Take 81 mg by mouth daily. Swallow whole.     atorvastatin  (LIPITOR) 80 MG tablet TAKE ONE TABLET BY MOUTH EVERY DAY 90 tablet 1   blood glucose meter kit  and supplies KIT Test daily Dx  r73.03 1 each 0   Blood Glucose Monitoring Suppl DEVI 1 each by Does not apply route daily. May substitute to any manufacturer covered by patient's insurance. 1 each 0   Blood Pressure Monitor KIT 1 each by Does not apply route daily. 1 kit 0   eszopiclone  (LUNESTA ) 2 MG TABS tablet TAKE ONE TABLET BY MOUTH AT BEDTIME AS NEEDED 30 tablet 0   FARXIGA  5 MG TABS tablet TAKE (1) TABLET BY MOUTH EACH MORNING. 90 tablet 0   Glucose Blood (BLOOD GLUCOSE TEST STRIPS) STRP Once daily testing May substitute to any manufacturer covered by patient's insurance. 100 strip 2   glucose blood test strip Use as instructed. Can check up to 4 times daily. 100 each 3   KERENDIA  10 MG TABS Take 10 mg by mouth daily.     Lancet Device MISC Once daily dx e11.9 May substitute to any manufacturer covered by patient's insurance. 1 each 0   latanoprost  (XALATAN ) 0.005 % ophthalmic solution Place 1 drop into both eyes at bedtime.     Semaglutide , 2 MG/DOSE, (OZEMPIC , 2 MG/DOSE,)  8 MG/3ML SOPN INJECT 2mg  into skin once weekly 3 mL 2   sildenafil  (VIAGRA ) 50 MG tablet Take 1 tablet (50 mg total) by mouth daily as needed for erectile dysfunction. 20 tablet 0   timolol  (BETIMOL ) 0.5 % ophthalmic solution Place 1 drop into both eyes in the morning.     hydrOXYzine  (VISTARIL ) 25 MG capsule TAKE ONE CAPSULE BY MOUTH EVERY 8 HOURS AS NEEDED FOR ANXIETY 30 capsule 2   No current facility-administered medications for this visit.    Review of Systems: GENERAL: negative for malaise, night sweats HEENT: No changes in hearing or vision, no nose bleeds or other nasal problems. NECK: Negative for lumps, goiter, pain and significant neck swelling RESPIRATORY: Negative for cough, wheezing CARDIOVASCULAR: Negative for chest pain, leg swelling, palpitations, orthopnea GI: SEE HPI MUSCULOSKELETAL: Negative for joint pain or swelling, back pain, and muscle pain. SKIN: Negative for lesions, rash HEMATOLOGY Negative for prolonged bleeding, bruising easily, and swollen nodes. ENDOCRINE: Negative for cold or heat intolerance, polyuria, polydipsia and goiter. NEURO: negative for tremor, gait imbalance, syncope and seizures. The remainder of the review of systems is noncontributory.   Physical Exam: BP (!) 91/57   Pulse 84   Temp 98.6 F (37 C)   Ht 5' 9 (1.753 m)   Wt 252 lb 9.6 oz (114.6 kg)   BMI 37.30 kg/m  GENERAL: The patient is AO x3, in no acute distress. HEENT: Head is normocephalic and atraumatic. EOMI are intact. Mouth is well hydrated and without lesions. NECK: Supple. No masses LUNGS: Clear to auscultation. No presence of rhonchi/wheezing/rales. Adequate chest expansion HEART: RRR, normal s1 and s2. ABDOMEN: Soft, nontender, no guarding, no peritoneal signs, and nondistended. BS +. No masses.   Imaging/Labs: as above     Latest Ref Rng & Units 07/08/2023    3:07 PM 02/18/2022   10:44 AM 12/04/2021    9:30 AM  CBC  WBC 3.4 - 10.8 x10E3/uL 13.4  8.8  6.8    Hemoglobin 13.0 - 17.7 g/dL 82.2  85.1  84.4   Hematocrit 37.5 - 51.0 % 51.4  44.7  45.1   Platelets 150 - 450 x10E3/uL 212  214  178    Lab Results  Component Value Date   IRON 76 05/15/2021   TIBC 316 05/15/2021   FERRITIN 225 05/15/2021  I personally reviewed and interpreted the available labs, imaging and endoscopic files.  CT Abdomen and Pelvis 01/2024   IMPRESSION: 1. Fat containing left inguinal hernia. 2. Small fat containing umbilical hernia. 3. Lobular hepatic contour with widening of the hepatic fissures, suggestive of cirrhosis. 4. Small hiatal hernia. 5. Aortic atherosclerosis.  Impression and Plan:  [name] is a 53 y.o. male with history of CKD, chronic anemia, diabetes, depression, chronic narcotic use, stroke, hypertension, hyperlipidemia, history of gout, obesity status post RYGB in 2010 who presents for evaluation of CT finding of Cirrhosis   # Compensated cirrhosis  Unable to calculate MELD score no INR in system  Predicted Postoperative Outcomes by the VOCAL-Penn Score:     30-day mortality: 0.0%     90-day mortality: 0.2%     180-day mortality: 0.3%     90-day decompensation: 1.9%  Patient appears low risk for low risk surgery(inguinal hernia.  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 given elevated BMI, prediabetes  # Hepatic encephalopathy None on exam  - Avoid opiates or benzodiazepines   # Ascites None on imaging and exam   # Esophageal varices - Last EGD 2022, and PLT>150   # HCC screening - Last abdominal imaging 01/2024 - Last AFP none  Recommendation   - Check CBC, MELD labs and AFP - Schedule liver US  with elastrography   - Reduce salt intake to <2 g per day - Can take Tylenol  max of 2 g per day (650 mg q8h) for pain - Avoid NSAIDs for pain - Avoid eating raw oysters/shellfish - Protein shake (Ensure or Boost) every night before going to sleep  #Colon polyps Patient had 10 tubular adenomas in 2022 and 9 tubular  adenomas before that  Will schedule surveillance colonoscopy in May refer patient to genetic testing given large tubular adenoma burden  I thoroughly discussed with the patient the procedure, including the risks involved. Patient understands what the procedure involves including the benefits and any risks. Patient understands alternatives to the proposed procedure. Risks including (but not limited to) bleeding, tearing of the lining (perforation), rupture of adjacent organs, problems with heart and lung function, infection, and medication reactions. A small percentage of complications may require surgery, hospitalization, repeat endoscopic procedure, and/or transfusion.  Patient understood and agreed.   All questions were answered.      Asal Teas Faizan Dorthula Bier, MD Gastroenterology and Hepatology [facility]   This chart has been completed using [facility] Dictation software, and while attempts have been made to ensure accuracy , certain words and phrases may not be transcribed as intended

## 2024-03-20 NOTE — Telephone Encounter (Signed)
 Placard  Noted Copied Sleeved Original placed provider box Copy placed front desk folder

## 2024-03-20 NOTE — Progress Notes (Signed)
 [name] , M.D. Gastroenterology & Hepatology [facility] [address] [place], [place] [address] Primary Care Physician: Bevely Doffing, FNP [address] [place] [address]  Chief Complaint:  CT finding of Cirrhosis , surveillance colonoscopy  History of Present Illness:  [name] is a 53 y.o. male with history of CKD, chronic anemia, diabetes, depression, chronic narcotic use, stroke, hypertension, hyperlipidemia, history of gout, obesity status post RYGB in 2010 who presents for evaluation of CT finding of Cirrhosis   Patient was last seen by Dr. Eartha in 2022 for iron deficiency anemia.  He underwent bidirectional endoscopy found to have 10 tubular adenoma and a large hiatal hernia (10 cm).  Patient recently had a CT scan for preoperative evaluation of inguinal hernia repair and had questionable liver finding of cirrhosis.  Patient denies alcohol use or any herbal medications The patient denies having any nausea, vomiting, fever, chills, hematochezia, melena, hematemesis, abdominal distention, abdominal pain, diarrhea, jaundice, pruritus or weight loss.  Labs from 06/2023 creatinine 2.05 ALP 129 AST 26 ALT 20 T. bili 0.6 Hemoglobin 17.7 Hemoglobin A1c 6.1  Last EGD:2022  - 10 cm hiatal hernia. - A medium amount of food ( residue) in the stomach. Small gastric pouch. Unclear previous bariatric surgery. - Normal examined duodenum. - No specimens collected.  Last Colonoscopy:2022  - Hemorrhoids found on perianal exam. - Ten 3 to 10 mm polyps in the sigmoid colon, in the descending colon, in the transverse colon, in the ascending colon and in the cecum, removed with a cold snare. Resected and retrieved. - Congested mucosa in the transverse colon. Biopsied. - The distal rectum and anal verge are normal on retroflexion view.   A. COLON, ASCENDING, TRANSVERSE, POLYPECTOMY:  - Tubular adenoma(s)  -  Negative for high-grade dysplasia or malignancy   B. COLON, TRANSVERSE, BIOPSY:  - Colonic mucosa with mild nonspecific hyperplastic changes  - Negative for dysplasia   C. COLON, DESCENDING, POLYPECTOMY:  - Tubular adenoma(s)  - Negative for high-grade dysplasia or malignancy   Repeat 3 years   Patient has a history of at least 9 polyps before age 89.   FHx: neg for any gastrointestinal/liver disease,mother bladder cancer Social: neg smoking, alcohol or illicit drug use Surgical:abdominal skin removal and umbilical hernia,RYGB  Past Medical History: Past Medical History:  Diagnosis Date   ALLERGY 08/03/2008   Qualifier: Diagnosis of  By: Bettie Slough     Anemia    Arthritis    hips (01/15/2015)   Blood transfusion without reported diagnosis    Cellulitis 07/25/2018   Chronic back pain greater than 3 months duration 02/11/2017   Chronic lower back pain    Colon polyps    COLONIC POLYPS, ADENOMATOUS 08/03/2008   Qualifier: Diagnosis of  By: Bettie Slough     CVA 08/03/2008   Qualifier: History of  By: Bettie Slough     Depression    denies   Diabetes mellitus    diet controlled- no meds since wt loss   Diabetes mellitus (HCC) 07/10/2019   Dysrhythmia    s/p surgery bariatric- cardioversion   Erectile dysfunction 01/26/2018   GERD (gastroesophageal reflux disease)    HIATAL HERNIA 08/03/2008   Qualifier: Diagnosis of  By: Bettie Slough     Hip pain    History of gout    Hyperlipidemia    Hypertension    Joint pain    Narcotic dependence (HCC) 02/11/2017  Neuromuscular disorder (HCC)    PONV (postoperative nausea and vomiting)    Prediabetes 03/29/2017   Sleep apnea    no longer have to use cpap since wt loss   Stage 3 chronic kidney disease (HCC) 01/26/2018   Stroke (HCC) 2006   denies residual on 01/15/2015   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 08/01/2018    Past Surgical History: Past Surgical History:  Procedure Laterality  Date   BARIATRIC SURGERY  2010   in [place]   BIOPSY  02/26/2021   Procedure: BIOPSY;  Surgeon: Eartha Angelia Sieving, MD;  [facility];  Service: Gastroenterology;;   CARDIOVERSION     COLONOSCOPY N/A 08/02/2014   Procedure: COLONOSCOPY;  Surgeon: Claudis RAYMOND Rivet, MD;  [facility];  Service: Endoscopy;  Laterality: N/A;  210 - moved to 3/10 @ 11:55 - Ann notified pt   COLONOSCOPY WITH PROPOFOL  N/A 02/26/2021   Procedure: COLONOSCOPY WITH PROPOFOL ;  Surgeon: Eartha Angelia Sieving, MD;  [facility];  Service: Gastroenterology;  Laterality: N/A;  10:00   ESOPHAGOGASTRODUODENOSCOPY     ESOPHAGOGASTRODUODENOSCOPY (EGD) WITH PROPOFOL  N/A 02/26/2021   Procedure: ESOPHAGOGASTRODUODENOSCOPY (EGD) WITH PROPOFOL ;  Surgeon: Eartha Angelia Sieving, MD;  [facility];  Service: Gastroenterology;  Laterality: N/A;   HERNIA REPAIR     LIPOSUCTION     PANNICULECTOMY  01/14/2015   PANNICULECTOMY N/A 01/14/2015   Procedure:  TOTAL PANNICULECTOMY WTH LIPOSUCTION OF SIDES;  Surgeon: Elna Pick, MD;  [facility];  Service: Plastics;  Laterality: N/A;   POLYPECTOMY  02/26/2021   Procedure: POLYPECTOMY INTESTINAL;  Surgeon: Eartha Angelia Sieving, MD;  [facility];  Service: Gastroenterology;;   TONSILLECTOMY     TOTAL HIP ARTHROPLASTY Left 12/16/2021   Procedure: TOTAL HIP ARTHROPLASTY ANTERIOR APPROACH;  Surgeon: Beverley Evalene BIRCH, MD;  Location: [facility];  Service: Orthopedics;  Laterality: Left;   TOTAL HIP ARTHROPLASTY Right 03/03/2022   Procedure: TOTAL HIP ARTHROPLASTY ANTERIOR APPROACH;  Surgeon: Beverley Evalene BIRCH, MD;  [facility]: [facility];  Service: Orthopedics;  Laterality: Right;   VENTRAL HERNIA REPAIR  01/14/2015   VENTRAL HERNIA REPAIR N/A 01/14/2015   Procedure: HERNIA REPAIR VENTRAL ADULT AND MUSCLE REPAIR;  Surgeon: Elna Pick, MD;  [facility];  Service: Plastics;  Laterality: N/A;    Family History: Family  History  Problem Relation Age of Onset   COPD Mother    Depression Mother    Hyperlipidemia Mother    Hypertension Mother    Heart disease Mother    Thyroid  disease Mother    Anxiety disorder Mother    Diabetes Father    Pulmonary embolism Father 68       blood clot   Hyperlipidemia Father    Sudden death Father    Obesity Father    Eating disorder Father    Hypertension Brother     Social History: Social History   Tobacco Use  Smoking Status Never  Smokeless Tobacco Never   Social History   Substance and Sexual Activity  Alcohol Use No   Social History   Substance and Sexual Activity  Drug Use No    Allergies: No Known Allergies  Medications: Current Outpatient Medications  Medication Sig Dispense Refill   aspirin  EC 81 MG tablet Take 81 mg by mouth daily. Swallow whole.     atorvastatin  (LIPITOR) 80 MG tablet TAKE ONE TABLET BY MOUTH EVERY DAY 90 tablet 1   blood glucose meter kit  and supplies KIT Test daily Dx  r73.03 1 each 0   Blood Glucose Monitoring Suppl DEVI 1 each by Does not apply route daily. May substitute to any manufacturer covered by patient's insurance. 1 each 0   Blood Pressure Monitor KIT 1 each by Does not apply route daily. 1 kit 0   eszopiclone  (LUNESTA ) 2 MG TABS tablet TAKE ONE TABLET BY MOUTH AT BEDTIME AS NEEDED 30 tablet 0   FARXIGA  5 MG TABS tablet TAKE (1) TABLET BY MOUTH EACH MORNING. 90 tablet 0   Glucose Blood (BLOOD GLUCOSE TEST STRIPS) STRP Once daily testing May substitute to any manufacturer covered by patient's insurance. 100 strip 2   glucose blood test strip Use as instructed. Can check up to 4 times daily. 100 each 3   KERENDIA  10 MG TABS Take 10 mg by mouth daily.     Lancet Device MISC Once daily dx e11.9 May substitute to any manufacturer covered by patient's insurance. 1 each 0   latanoprost  (XALATAN ) 0.005 % ophthalmic solution Place 1 drop into both eyes at bedtime.     Semaglutide , 2 MG/DOSE, (OZEMPIC , 2 MG/DOSE,)  8 MG/3ML SOPN INJECT 2mg  into skin once weekly 3 mL 2   sildenafil  (VIAGRA ) 50 MG tablet Take 1 tablet (50 mg total) by mouth daily as needed for erectile dysfunction. 20 tablet 0   timolol  (BETIMOL ) 0.5 % ophthalmic solution Place 1 drop into both eyes in the morning.     hydrOXYzine  (VISTARIL ) 25 MG capsule TAKE ONE CAPSULE BY MOUTH EVERY 8 HOURS AS NEEDED FOR ANXIETY 30 capsule 2   No current facility-administered medications for this visit.    Review of Systems: GENERAL: negative for malaise, night sweats HEENT: No changes in hearing or vision, no nose bleeds or other nasal problems. NECK: Negative for lumps, goiter, pain and significant neck swelling RESPIRATORY: Negative for cough, wheezing CARDIOVASCULAR: Negative for chest pain, leg swelling, palpitations, orthopnea GI: SEE HPI MUSCULOSKELETAL: Negative for joint pain or swelling, back pain, and muscle pain. SKIN: Negative for lesions, rash HEMATOLOGY Negative for prolonged bleeding, bruising easily, and swollen nodes. ENDOCRINE: Negative for cold or heat intolerance, polyuria, polydipsia and goiter. NEURO: negative for tremor, gait imbalance, syncope and seizures. The remainder of the review of systems is noncontributory.   Physical Exam: BP (!) 91/57   Pulse 84   Temp 98.6 F (37 C)   Ht 5' 9 (1.753 m)   Wt 252 lb 9.6 oz (114.6 kg)   BMI 37.30 kg/m  GENERAL: The patient is AO x3, in no acute distress. HEENT: Head is normocephalic and atraumatic. EOMI are intact. Mouth is well hydrated and without lesions. NECK: Supple. No masses LUNGS: Clear to auscultation. No presence of rhonchi/wheezing/rales. Adequate chest expansion HEART: RRR, normal s1 and s2. ABDOMEN: Soft, nontender, no guarding, no peritoneal signs, and nondistended. BS +. No masses.   Imaging/Labs: as above     Latest Ref Rng & Units 07/08/2023    3:07 PM 02/18/2022   10:44 AM 12/04/2021    9:30 AM  CBC  WBC 3.4 - 10.8 x10E3/uL 13.4  8.8  6.8    Hemoglobin 13.0 - 17.7 g/dL 82.2  85.1  84.4   Hematocrit 37.5 - 51.0 % 51.4  44.7  45.1   Platelets 150 - 450 x10E3/uL 212  214  178    Lab Results  Component Value Date   IRON 76 05/15/2021   TIBC 316 05/15/2021   FERRITIN 225 05/15/2021  I personally reviewed and interpreted the available labs, imaging and endoscopic files.  CT Abdomen and Pelvis 01/2024   IMPRESSION: 1. Fat containing left inguinal hernia. 2. Small fat containing umbilical hernia. 3. Lobular hepatic contour with widening of the hepatic fissures, suggestive of cirrhosis. 4. Small hiatal hernia. 5. Aortic atherosclerosis.  Impression and Plan:  [name] is a 53 y.o. male with history of CKD, chronic anemia, diabetes, depression, chronic narcotic use, stroke, hypertension, hyperlipidemia, history of gout, obesity status post RYGB in 2010 who presents for evaluation of CT finding of Cirrhosis   # Compensated cirrhosis  Unable to calculate MELD score no INR in system  Predicted Postoperative Outcomes by the VOCAL-Penn Score:     30-day mortality: 0.0%     90-day mortality: 0.2%     180-day mortality: 0.3%     90-day decompensation: 1.9%  Patient appears low risk for low risk surgery(inguinal hernia.  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 given elevated BMI, prediabetes  # Hepatic encephalopathy None on exam  - Avoid opiates or benzodiazepines   # Ascites None on imaging and exam   # Esophageal varices - Last EGD 2022, and PLT>150   # HCC screening - Last abdominal imaging 01/2024 - Last AFP none  Recommendation   - Check CBC, MELD labs and AFP - Schedule liver US  with elastrography   - Reduce salt intake to <2 g per day - Can take Tylenol  max of 2 g per day (650 mg q8h) for pain - Avoid NSAIDs for pain - Avoid eating raw oysters/shellfish - Protein shake (Ensure or Boost) every night before going to sleep  #Colon polyps Patient had 10 tubular adenomas in 2022 and 9 tubular  adenomas before that  Will schedule surveillance colonoscopy in May refer patient to genetic testing given large tubular adenoma burden  I thoroughly discussed with the patient the procedure, including the risks involved. Patient understands what the procedure involves including the benefits and any risks. Patient understands alternatives to the proposed procedure. Risks including (but not limited to) bleeding, tearing of the lining (perforation), rupture of adjacent organs, problems with heart and lung function, infection, and medication reactions. A small percentage of complications may require surgery, hospitalization, repeat endoscopic procedure, and/or transfusion.  Patient understood and agreed.   All questions were answered.      Asal Teas Faizan Dorthula Bier, MD Gastroenterology and Hepatology [facility]   This chart has been completed using [facility] Dictation software, and while attempts have been made to ensure accuracy , certain words and phrases may not be transcribed as intended

## 2024-03-20 NOTE — Telephone Encounter (Signed)
 PA not required.

## 2024-03-20 NOTE — Telephone Encounter (Signed)
 Spoke with patient in the office, scheduled TCS for 04/05/2024 at 1:45pm. Rx sent to pharmacy. Instructions given to patient in the office.

## 2024-03-20 NOTE — Patient Instructions (Signed)
 It was very nice to meet you today, as dicussed with will plan for the following :  1) labs and ultrasound  2) Colonoscopy

## 2024-03-22 ENCOUNTER — Encounter: Payer: Self-pay | Admitting: *Deleted

## 2024-03-22 LAB — COMPREHENSIVE METABOLIC PANEL WITH GFR
ALT: 21 [IU]/L (ref 0–44)
AST: 23 [IU]/L (ref 0–40)
Albumin: 4.3 g/dL (ref 3.8–4.9)
Alkaline Phosphatase: 122 [IU]/L (ref 47–123)
BUN/Creatinine Ratio: 13 (ref 9–20)
BUN: 26 mg/dL — ABNORMAL HIGH (ref 6–24)
Bilirubin Total: 0.9 mg/dL (ref 0.0–1.2)
CO2: 24 mmol/L (ref 20–29)
Calcium: 9.9 mg/dL (ref 8.7–10.2)
Chloride: 99 mmol/L (ref 96–106)
Creatinine, Ser: 1.94 mg/dL — ABNORMAL HIGH (ref 0.76–1.27)
Globulin, Total: 2.8 g/dL (ref 1.5–4.5)
Glucose: 177 mg/dL — ABNORMAL HIGH (ref 70–99)
Potassium: 4.5 mmol/L (ref 3.5–5.2)
Sodium: 138 mmol/L (ref 134–144)
Total Protein: 7.1 g/dL (ref 6.0–8.5)
eGFR: 40 mL/min/{1.73_m2} — ABNORMAL LOW

## 2024-03-22 LAB — AFP TUMOR MARKER: AFP, Serum, Tumor Marker: 2.7 ng/mL (ref 0.0–8.4)

## 2024-03-22 LAB — IRON,TIBC AND FERRITIN PANEL
Ferritin: 116 ng/mL (ref 30–400)
Iron Saturation: 28 % (ref 15–55)
Iron: 77 ug/dL (ref 38–169)
Total Iron Binding Capacity: 280 ug/dL (ref 250–450)
UIBC: 203 ug/dL (ref 111–343)

## 2024-03-22 LAB — ANTI-SMOOTH MUSCLE ANTIBODY, IGG: Smooth Muscle Ab: 16 U (ref 0–19)

## 2024-03-22 LAB — HEPATITIS B CORE ANTIBODY, TOTAL: Hep B Core Total Ab: NEGATIVE

## 2024-03-22 LAB — ANA

## 2024-03-22 LAB — HIV ANTIBODY (ROUTINE TESTING W REFLEX): HIV Screen 4th Generation wRfx: NONREACTIVE

## 2024-03-22 LAB — HEPATITIS A ANTIBODY, TOTAL: hep A Total Ab: NEGATIVE

## 2024-03-22 LAB — HEPATITIS C ANTIBODY: Hep C Virus Ab: NONREACTIVE

## 2024-03-22 LAB — MITOCHONDRIAL ANTIBODIES: Mitochondrial Ab: <20 U (ref 0.0–20.0)

## 2024-03-22 LAB — PROTIME-INR
INR: 1 (ref 0.9–1.2)
Prothrombin Time: 10.9 s (ref 9.1–12.0)

## 2024-03-22 LAB — HEPATITIS B SURFACE ANTIGEN: Hepatitis B Surface Ag: NEGATIVE

## 2024-03-22 LAB — HEPATITIS B SURFACE ANTIBODY,QUALITATIVE: Hep B Surface Ab, Qual: NONREACTIVE

## 2024-03-22 NOTE — Telephone Encounter (Signed)
 Patient picked up form

## 2024-03-24 ENCOUNTER — Ambulatory Visit (INDEPENDENT_AMBULATORY_CARE_PROVIDER_SITE_OTHER): Payer: Self-pay | Admitting: Gastroenterology

## 2024-03-24 NOTE — Progress Notes (Signed)
 Hi Tanya ,  Can you please call the patient and tell the patient the lab work is negative for hepatitis A, B, C and autoimmune disease of the liver.  This is all good news  I do recommend getting vaccinated against hepatitis A and B with your PCP  Also awaiting ultrasound of the liver  I look forward to seeing you on the day of the procedure on 1112  Thanks,  Levii Hairfield Faizan Kikuye Korenek, MD Gastroenterology and Hepatology [facility] Gastroenterology ============  Labs Creatinine 1.94 (baseline 2) Normal liver enzymes  Negative ANA, AMA, ASMA  INR 1.0  Hep A nonimmune Hep B core antibody negative Hep B surface antibody negative Hep B surface antigen negative Hep C negative HIV negative  AFP 2.7  Normal iron profile  Awaiting ultrasound

## 2024-03-27 ENCOUNTER — Telehealth: Payer: Self-pay

## 2024-03-27 ENCOUNTER — Other Ambulatory Visit: Payer: Self-pay

## 2024-03-27 DIAGNOSIS — G4700 Insomnia, unspecified: Secondary | ICD-10-CM

## 2024-03-27 NOTE — Telephone Encounter (Signed)
 Copied from [id number]. Topic: Appointments - Scheduling Inquiry for Clinic >> Mar 27, 2024 11:40 AM Sophia H wrote: Reason for CRM: Patient was told he needs to have a Hep A & B vaccine, I know that Hep A is not offered at this clinic but needing to know if patient has to have A before he can have B. Please reach out to patient and advise, no one available to speak with when I called [facility] for clarification. # [phone]

## 2024-03-28 NOTE — Telephone Encounter (Signed)
 Okay to have these close together.

## 2024-03-28 NOTE — Telephone Encounter (Signed)
 Pt advised with verbal understanding

## 2024-03-30 ENCOUNTER — Ambulatory Visit: Admitting: General Surgery

## 2024-03-30 ENCOUNTER — Encounter: Payer: Self-pay | Admitting: General Surgery

## 2024-03-30 VITALS — BP 117/79 | HR 83 | Temp 97.5°F | Resp 16 | Ht 69.0 in | Wt 256.0 lb

## 2024-03-30 DIAGNOSIS — K4090 Unilateral inguinal hernia, without obstruction or gangrene, not specified as recurrent: Secondary | ICD-10-CM | POA: Diagnosis not present

## 2024-03-30 NOTE — Progress Notes (Signed)
 [name]; [id number]; [birth year]   HPI Patient is a 53 year old white male who is referred to my care by Leita Mounts for evaluation and treatment of a left inguinal hernia as well as an umbilical hernia.  Patient states he is unaware of having an umbilical hernia.  He does have a left inguinal hernia that occasionally bothers him.  Has had it for some time now, but he has noticed increasing discomfort when straining.  He has never had an episode of incarceration.  He has other medical issues that are being worked up including possible cirrhosis for which he is seeing GI and history of heart block which he is following cardiology.  He has seen cardiology in December.  He is status post an abdominoplasty in the remote past. Past Medical History:  Diagnosis Date   ALLERGY 08/03/2008   Qualifier: Diagnosis of  By: Bettie Slough     Anemia    Arthritis    hips (01/15/2015)   Blood transfusion without reported diagnosis    Cellulitis 07/25/2018   Chronic back pain greater than 3 months duration 02/11/2017   Chronic lower back pain    Colon polyps    COLONIC POLYPS, ADENOMATOUS 08/03/2008   Qualifier: Diagnosis of  By: Bettie Slough     CVA 08/03/2008   Qualifier: History of  By: Bettie Slough     Depression    denies   Diabetes mellitus    diet controlled- no meds since wt loss   Diabetes mellitus (HCC) 07/10/2019   Dysrhythmia    s/p surgery bariatric- cardioversion   Erectile dysfunction 01/26/2018   GERD (gastroesophageal reflux disease)    HIATAL HERNIA 08/03/2008   Qualifier: Diagnosis of  By: Bettie Slough     Hip pain    History of gout    Hyperlipidemia    Hypertension    Joint pain    Narcotic dependence (HCC) 02/11/2017   Neuromuscular disorder (HCC)    PONV (postoperative nausea and vomiting)    Prediabetes 03/29/2017   Sleep apnea    no longer have to use cpap since wt loss   Stage 3 chronic kidney disease (HCC) 01/26/2018   Stroke (HCC) 2006    denies residual on 01/15/2015   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 08/01/2018    Past Surgical History:  Procedure Laterality Date   BARIATRIC SURGERY  2010   in [place]   BIOPSY  02/26/2021   Procedure: BIOPSY;  Surgeon: Eartha Angelia Sieving, MD;  [facility];  Service: Gastroenterology;;   CARDIOVERSION     COLONOSCOPY N/A 08/02/2014   Procedure: COLONOSCOPY;  Surgeon: Claudis RAYMOND Rivet, MD;  [facility];  Service: Endoscopy;  Laterality: N/A;  210 - moved to 3/10 @ 11:55 - Ann notified pt   COLONOSCOPY WITH PROPOFOL  N/A 02/26/2021   Procedure: COLONOSCOPY WITH PROPOFOL ;  Surgeon: Eartha Angelia Sieving, MD;  [facility];  Service: Gastroenterology;  Laterality: N/A;  10:00   ESOPHAGOGASTRODUODENOSCOPY     ESOPHAGOGASTRODUODENOSCOPY (EGD) WITH PROPOFOL  N/A 02/26/2021   Procedure: ESOPHAGOGASTRODUODENOSCOPY (EGD) WITH PROPOFOL ;  Surgeon: Eartha Angelia Sieving, MD;  [facility];  Service: Gastroenterology;  Laterality: N/A;   HERNIA REPAIR     LIPOSUCTION     PANNICULECTOMY  01/14/2015   PANNICULECTOMY N/A 01/14/2015   Procedure:  TOTAL PANNICULECTOMY WTH LIPOSUCTION OF SIDES;  Surgeon: Elna Pick, MD;  [facility];  Service: Plastics;  Laterality: N/A;   POLYPECTOMY  02/26/2021   Procedure: POLYPECTOMY INTESTINAL;  Surgeon: Eartha Angelia Sieving, MD;  [facility];  Service: Gastroenterology;;   TONSILLECTOMY     TOTAL HIP ARTHROPLASTY Left 12/16/2021   Procedure: TOTAL HIP ARTHROPLASTY ANTERIOR APPROACH;  Surgeon: Beverley Evalene BIRCH, MD;  [facility];  Service: Orthopedics;  Laterality: Left;   TOTAL HIP ARTHROPLASTY Right 03/03/2022   Procedure: TOTAL HIP ARTHROPLASTY ANTERIOR APPROACH;  Surgeon: Beverley Evalene BIRCH, MD;  [facility];  Service: Orthopedics;  Laterality: Right;   VENTRAL HERNIA REPAIR  01/14/2015   VENTRAL HERNIA REPAIR N/A 01/14/2015   Procedure: HERNIA REPAIR VENTRAL  ADULT AND MUSCLE REPAIR;  Surgeon: Elna Pick, MD;  [facility];  Service: Plastics;  Laterality: N/A;    Family History  Problem Relation Age of Onset   COPD Mother    Depression Mother    Hyperlipidemia Mother    Hypertension Mother    Heart disease Mother    Thyroid  disease Mother    Anxiety disorder Mother    Diabetes Father    Pulmonary embolism Father 72       blood clot   Hyperlipidemia Father    Sudden death Father    Obesity Father    Eating disorder Father    Hypertension Brother     Current Outpatient Medications on File Prior to Visit  Medication Sig Dispense Refill   aspirin  EC 81 MG tablet Take 81 mg by mouth daily. Swallow whole.     atorvastatin  (LIPITOR) 80 MG tablet TAKE ONE TABLET BY MOUTH EVERY DAY 90 tablet 1   blood glucose meter kit and supplies KIT Test daily Dx  r73.03 1 each 0   Blood Glucose Monitoring Suppl DEVI 1 each by Does not apply route daily. May substitute to any manufacturer covered by patient's insurance. 1 each 0   Blood Pressure Monitor KIT 1 each by Does not apply route daily. 1 kit 0   eszopiclone  (LUNESTA ) 2 MG TABS tablet TAKE ONE TABLET BY MOUTH AT BEDTIME AS NEEDED 30 tablet 0   FARXIGA  5 MG TABS tablet TAKE (1) TABLET BY MOUTH EACH MORNING. 90 tablet 0   Glucose Blood (BLOOD GLUCOSE TEST STRIPS) STRP Once daily testing May substitute to any manufacturer covered by patient's insurance. 100 strip 2   glucose blood test strip Use as instructed. Can check up to 4 times daily. 100 each 3   hydrOXYzine  (VISTARIL ) 25 MG capsule TAKE ONE CAPSULE BY MOUTH EVERY 8 HOURS AS NEEDED FOR ANXIETY 30 capsule 2   KERENDIA  10 MG TABS Take 10 mg by mouth daily.     Lancet Device MISC Once daily dx e11.9 May substitute to any manufacturer covered by patient's insurance. 1 each 0   latanoprost  (XALATAN ) 0.005 % ophthalmic solution Place 1 drop into both eyes at bedtime.     Semaglutide , 2 MG/DOSE, (OZEMPIC , 2 MG/DOSE,) 8 MG/3ML SOPN INJECT 2mg   into skin once weekly 3 mL 2   sildenafil  (VIAGRA ) 50 MG tablet Take 1 tablet (50 mg total) by mouth daily as needed for erectile dysfunction. 20 tablet 0   timolol  (BETIMOL ) 0.5 % ophthalmic solution Place 1 drop into both eyes in the morning.     No current facility-administered medications on file prior to visit.    No Known Allergies  Social History   Substance and Sexual Activity  Alcohol Use No    Social History   Tobacco Use  Smoking Status Never  Smokeless Tobacco Never  Review of Systems  Constitutional: Negative.   HENT: Negative.    Eyes: Negative.   Respiratory: Negative.    Cardiovascular: Negative.   Gastrointestinal: Negative.   Genitourinary: Negative.   Musculoskeletal: Negative.   Skin: Negative.   Neurological: Negative.   Endo/Heme/Allergies: Negative.   Psychiatric/Behavioral: Negative.      Objective   Vitals:   03/30/24 1118  BP: 117/79  Pulse: 83  Resp: 16  Temp: (!) 97.5 F (36.4 C)  SpO2: 97%    Physical Exam Vitals reviewed.  Constitutional:      Appearance: Normal appearance. He is not ill-appearing.  HENT:     Head: Normocephalic and atraumatic.  Cardiovascular:     Rate and Rhythm: Normal rate and regular rhythm.     Heart sounds: Normal heart sounds. No murmur heard.    No friction rub. No gallop.  Pulmonary:     Effort: Pulmonary effort is normal. No respiratory distress.     Breath sounds: Normal breath sounds. No stridor. No wheezing, rhonchi or rales.  Abdominal:     General: Bowel sounds are normal. There is no distension.     Palpations: Abdomen is soft. There is no mass.     Tenderness: There is no abdominal tenderness. There is no guarding or rebound.     Hernia: A hernia is present.     Comments: He has an easily reducible left inguinal hernia.  He has laxity at the umbilicus but no discrete hernia is appreciated.  Skin:    General: Skin is warm and dry.  Neurological:     Mental Status: He is alert and  oriented to person, place, and time.     Assessment  Left inguinal hernia History of heart block Plan  Patient will be seeing his cardiologist next month.  Will obtain cardiac clearance at that time.  He was instructed to return to my care after that visit to schedule a robotic assisted laparoscopic left inguinal herniorrhaphy with mesh.  The risks and benefits of the procedure including bleeding, infection, mesh use, bowel injury, and the possibility of recurrence of the hernia were fully explained to the patient, who gave informed consent.

## 2024-03-31 ENCOUNTER — Ambulatory Visit (HOSPITAL_COMMUNITY)
Admission: RE | Admit: 2024-03-31 | Discharge: 2024-03-31 | Disposition: A | Discharge status: Home / Self Care | Source: Ambulatory Visit | Attending: Gastroenterology | Admitting: Gastroenterology

## 2024-03-31 DIAGNOSIS — K769 Liver disease, unspecified: Secondary | ICD-10-CM | POA: Diagnosis not present

## 2024-03-31 DIAGNOSIS — K759 Inflammatory liver disease, unspecified: Secondary | ICD-10-CM | POA: Diagnosis not present

## 2024-03-31 DIAGNOSIS — K7460 Unspecified cirrhosis of liver: Secondary | ICD-10-CM | POA: Insufficient documentation

## 2024-03-31 DIAGNOSIS — K7689 Other specified diseases of liver: Secondary | ICD-10-CM | POA: Diagnosis not present

## 2024-04-02 ENCOUNTER — Other Ambulatory Visit: Payer: Self-pay

## 2024-04-02 DIAGNOSIS — E1121 Type 2 diabetes mellitus with diabetic nephropathy: Secondary | ICD-10-CM

## 2024-04-05 ENCOUNTER — Ambulatory Visit (HOSPITAL_COMMUNITY)
Admission: RE | Admit: 2024-04-05 | Discharge: 2024-04-05 | Disposition: A | Discharge status: Home / Self Care | Attending: Gastroenterology | Admitting: Gastroenterology

## 2024-04-05 ENCOUNTER — Telehealth (INDEPENDENT_AMBULATORY_CARE_PROVIDER_SITE_OTHER): Payer: Self-pay | Admitting: *Deleted

## 2024-04-05 ENCOUNTER — Encounter (HOSPITAL_COMMUNITY)
Admission: RE | Disposition: A | Discharge status: Home / Self Care | Payer: Self-pay | Source: Home / Self Care | Attending: Gastroenterology

## 2024-04-05 ENCOUNTER — Encounter (HOSPITAL_COMMUNITY): Payer: Self-pay | Admitting: Gastroenterology

## 2024-04-05 ENCOUNTER — Ambulatory Visit (HOSPITAL_COMMUNITY): Admitting: Anesthesiology

## 2024-04-05 ENCOUNTER — Encounter (INDEPENDENT_AMBULATORY_CARE_PROVIDER_SITE_OTHER): Payer: Self-pay

## 2024-04-05 ENCOUNTER — Encounter (INDEPENDENT_AMBULATORY_CARE_PROVIDER_SITE_OTHER): Payer: Self-pay | Admitting: *Deleted

## 2024-04-05 ENCOUNTER — Other Ambulatory Visit: Payer: Self-pay

## 2024-04-05 DIAGNOSIS — N183 Chronic kidney disease, stage 3 unspecified: Secondary | ICD-10-CM | POA: Diagnosis not present

## 2024-04-05 DIAGNOSIS — Z7985 Long-term (current) use of injectable non-insulin antidiabetic drugs: Secondary | ICD-10-CM | POA: Diagnosis not present

## 2024-04-05 DIAGNOSIS — E785 Hyperlipidemia, unspecified: Secondary | ICD-10-CM | POA: Diagnosis not present

## 2024-04-05 DIAGNOSIS — Z79899 Other long term (current) drug therapy: Secondary | ICD-10-CM | POA: Insufficient documentation

## 2024-04-05 DIAGNOSIS — Z6837 Body mass index (BMI) 37.0-37.9, adult: Secondary | ICD-10-CM | POA: Diagnosis not present

## 2024-04-05 DIAGNOSIS — I10 Essential (primary) hypertension: Secondary | ICD-10-CM

## 2024-04-05 DIAGNOSIS — E1122 Type 2 diabetes mellitus with diabetic chronic kidney disease: Secondary | ICD-10-CM | POA: Diagnosis not present

## 2024-04-05 DIAGNOSIS — D120 Benign neoplasm of cecum: Secondary | ICD-10-CM | POA: Diagnosis not present

## 2024-04-05 DIAGNOSIS — E669 Obesity, unspecified: Secondary | ICD-10-CM | POA: Diagnosis not present

## 2024-04-05 DIAGNOSIS — K429 Umbilical hernia without obstruction or gangrene: Secondary | ICD-10-CM | POA: Insufficient documentation

## 2024-04-05 DIAGNOSIS — K4090 Unilateral inguinal hernia, without obstruction or gangrene, not specified as recurrent: Secondary | ICD-10-CM | POA: Insufficient documentation

## 2024-04-05 DIAGNOSIS — D631 Anemia in chronic kidney disease: Secondary | ICD-10-CM | POA: Diagnosis not present

## 2024-04-05 DIAGNOSIS — E119 Type 2 diabetes mellitus without complications: Secondary | ICD-10-CM | POA: Diagnosis not present

## 2024-04-05 DIAGNOSIS — Z7984 Long term (current) use of oral hypoglycemic drugs: Secondary | ICD-10-CM | POA: Insufficient documentation

## 2024-04-05 DIAGNOSIS — Z860100 Personal history of colon polyps, unspecified: Secondary | ICD-10-CM

## 2024-04-05 DIAGNOSIS — Z1211 Encounter for screening for malignant neoplasm of colon: Secondary | ICD-10-CM | POA: Diagnosis not present

## 2024-04-05 DIAGNOSIS — R188 Other ascites: Secondary | ICD-10-CM | POA: Insufficient documentation

## 2024-04-05 DIAGNOSIS — K635 Polyp of colon: Secondary | ICD-10-CM

## 2024-04-05 DIAGNOSIS — Z7982 Long term (current) use of aspirin: Secondary | ICD-10-CM | POA: Insufficient documentation

## 2024-04-05 DIAGNOSIS — I129 Hypertensive chronic kidney disease with stage 1 through stage 4 chronic kidney disease, or unspecified chronic kidney disease: Secondary | ICD-10-CM | POA: Diagnosis not present

## 2024-04-05 DIAGNOSIS — D124 Benign neoplasm of descending colon: Secondary | ICD-10-CM | POA: Insufficient documentation

## 2024-04-05 DIAGNOSIS — K648 Other hemorrhoids: Secondary | ICD-10-CM | POA: Insufficient documentation

## 2024-04-05 DIAGNOSIS — D123 Benign neoplasm of transverse colon: Secondary | ICD-10-CM | POA: Insufficient documentation

## 2024-04-05 DIAGNOSIS — K7460 Unspecified cirrhosis of liver: Secondary | ICD-10-CM | POA: Diagnosis not present

## 2024-04-05 DIAGNOSIS — I700 Atherosclerosis of aorta: Secondary | ICD-10-CM | POA: Insufficient documentation

## 2024-04-05 HISTORY — PX: COLONOSCOPY: SHX5424

## 2024-04-05 LAB — GLUCOSE, CAPILLARY: Glucose-Capillary: 129 mg/dL — ABNORMAL HIGH (ref 70–99)

## 2024-04-05 LAB — HM COLONOSCOPY

## 2024-04-05 SURGERY — COLONOSCOPY
Anesthesia: Monitor Anesthesia Care

## 2024-04-05 MED ORDER — LACTATED RINGERS IV SOLN: INTRAVENOUS | Status: DC

## 2024-04-05 MED ORDER — PROPOFOL 10 MG/ML IV BOLUS
INTRAVENOUS | Status: DC | PRN
  Administered 2024-04-05: 50 mg via INTRAVENOUS

## 2024-04-05 MED ORDER — LIDOCAINE 2% (20 MG/ML) 5 ML SYRINGE
INTRAMUSCULAR | Status: DC | PRN
  Administered 2024-04-05: 50 mg via INTRAVENOUS

## 2024-04-05 MED ORDER — PROPOFOL 500 MG/50ML IV EMUL
INTRAVENOUS | Status: DC | PRN
  Administered 2024-04-05: 150 ug/kg/min via INTRAVENOUS

## 2024-04-05 NOTE — Op Note (Signed)
 [facility] Patient Name: [name] Procedure Date: 04/05/2024 1:49 PM MRN: [id number] Date of Birth: Apr 21, 1971 Attending MD: Deatrice Dine , MD, [id number] CSN: [id number] Age: 53 Admit Type: Outpatient Procedure:                Colonoscopy Indications:              High risk colon cancer surveillance: Personal                            history of colonic polyps, Surveillance: History of                            numerous (> 10) adenomas on last colonoscopy (< 3                            yrs) Providers:                Deatrice Dine, MD, Harlene Lips, Devere Lodge Referring MD:              Medicines:                Monitored Anesthesia Care Complications:            No immediate complications. Estimated Blood Loss:     Estimated blood loss was minimal. Procedure:                Pre-Anesthesia Assessment:                           - Prior to the procedure, a History and Physical                            was performed, and patient medications and                            allergies were reviewed. The patient's tolerance of                            previous anesthesia was also reviewed. The risks                            and benefits of the procedure and the sedation                            options and risks were discussed with the patient.                            All questions were answered, and informed consent                            was obtained. Prior Anticoagulants: The patient has                            taken no anticoagulant or antiplatelet agents. ASA  Grade Assessment: II - A patient with mild systemic                            disease. After reviewing the risks and benefits,                            the patient was deemed in satisfactory condition to                            undergo the procedure.                           After obtaining informed consent, the colonoscope                            was  passed under direct vision. Throughout the                            procedure, the patient's blood pressure, pulse, and                            oxygen saturations were monitored continuously. The                            [id number] ([id number]) Colon was introduced through                            the anus and advanced to the the cecum, identified                            by appendiceal orifice and ileocecal valve. The                            ileocecal valve, appendiceal orifice, and rectum                            were photographed. The quality of the bowel                            preparation was evaluated using the BBPS [facility]                            Bowel Preparation Scale) with scores of: Right                            Colon = 2 (minor amount of residual staining, small                            fragments of stool and/or opaque liquid, but mucosa                            seen well), Transverse Colon = 2 (minor amount of  residual staining, small fragments of stool and/or                            opaque liquid, but mucosa seen well) and Left Colon                            = 2 (minor amount of residual staining, small                            fragments of stool and/or opaque liquid, but mucosa                            seen well). The total BBPS score equals 6. Scope In: 2:17:18 PM Scope Out: 2:48:25 PM Scope Withdrawal Time: 0 hours 25 minutes 12 seconds  Total Procedure Duration: 0 hours 31 minutes 7 seconds  Findings:      The perianal and digital rectal examinations were normal.      Two sessile polyps were found in the cecum. The polyps were 4 to 10 mm       in size. These polyps were removed with a cold snare. Resection and       retrieval were complete.      Nine sessile polyps were found in the descending colon and transverse       colon. The polyps were 4 to 7 mm in size. These polyps were removed with       a cold  snare. Resection and retrieval were complete.      A moderate amount of stool was found in the entire colon, precluding       visualization. Lavage of the area was performed using a large amount of       sterile water , resulting in clearance with fair visualization.      Non-bleeding internal hemorrhoids were found during retroflexion. The       hemorrhoids were small. Impression:               - Two 4 to 10 mm polyps in the cecum, removed with                            a cold snare. Resected and retrieved.                           - Nine 4 to 7 mm polyps in the descending colon and                            in the transverse colon, removed with a cold snare.                            Resected and retrieved.                           - Stool in the entire examined colon.                           - Non-bleeding internal hemorrhoids. Moderate Sedation:      Per Anesthesia Care Recommendation:           -  Patient has a contact number available for                            emergencies. The signs and symptoms of potential                            delayed complications were discussed with the                            patient. Return to normal activities tomorrow.                            Written discharge instructions were provided to the                            patient.                           - Resume previous diet.                           - Continue present medications.                           - Await pathology results.                           - Repeat colonoscopy in 1 year for surveillance                            based on pathology results. EXTENDED BOWEL PREP                            NEXT TIME                           - Return to GI office as previously scheduled.                           -Given >30 lifetime polyps burden, recommended                            genetic testing Procedure Code(s):        --- Professional ---                           [phone],  Colonoscopy, flexible; with removal of                            tumor(s), polyp(s), or other lesion(s) by snare                            technique Diagnosis Code(s):        --- Professional ---                           D12.0, Benign neoplasm of  cecum                           D12.4, Benign neoplasm of descending colon                           D12.3, Benign neoplasm of transverse colon (hepatic                            flexure or splenic flexure)                           K64.8, Other hemorrhoids                           Z86.010, Personal history of colonic polyps CPT copyright 2022 American Medical Association. All rights reserved. The codes documented in this report are preliminary and upon coder review may  be revised to meet current compliance requirements. Deatrice Dine, MD Deatrice Dine, MD 04/05/2024 3:03:51 PM This report has been signed electronically. Number of Addenda: 0

## 2024-04-05 NOTE — Anesthesia Preprocedure Evaluation (Signed)
 Anesthesia Evaluation  Patient identified by MRN, date of birth, ID band Patient awake    Reviewed: Allergy & Precautions, H&P , NPO status , Patient's Chart, lab work & pertinent test results, reviewed documented beta blocker date and time   History of Anesthesia Complications (+) PONV and history of anesthetic complications  Airway Mallampati: II  TM Distance: >3 FB Neck ROM: full    Dental no notable dental hx.    Pulmonary sleep apnea    Pulmonary exam normal breath sounds clear to auscultation       Cardiovascular Exercise Tolerance: Good hypertension, + dysrhythmias  Rhythm:regular Rate:Normal     Neuro/Psych  PSYCHIATRIC DISORDERS Anxiety Depression     Neuromuscular disease CVA    GI/Hepatic Neg liver ROS,GERD  ,,  Endo/Other  diabetes    Renal/GU Renal disease  negative genitourinary   Musculoskeletal   Abdominal   Peds  Hematology  (+) Blood dyscrasia, anemia   Anesthesia Other Findings   Reproductive/Obstetrics negative OB ROS                              Anesthesia Physical Anesthesia Plan  ASA: 3  Anesthesia Plan: MAC   Post-op Pain Management:    Induction:   PONV Risk Score and Plan: Propofol  infusion  Airway Management Planned:   Additional Equipment:   Intra-op Plan:   Post-operative Plan:   Informed Consent: I have reviewed the patients History and Physical, chart, labs and discussed the procedure including the risks, benefits and alternatives for the proposed anesthesia with the patient or authorized representative who has indicated his/her understanding and acceptance.     Dental Advisory Given  Plan Discussed with: CRNA  Anesthesia Plan Comments:         Anesthesia Quick Evaluation

## 2024-04-05 NOTE — Interval H&P Note (Signed)
 History and Physical Interval Note:  04/05/2024 1:50 PM  [name]  has presented today for surgery, with the diagnosis of history of colon polyps, cirrhosis.  The various methods of treatment have been discussed with the patient and family. After consideration of risks, benefits and other options for treatment, the patient has consented to  Procedure(s) with comments: COLONOSCOPY (N/A) - 1:45pm, ASA 1-2 as a surgical intervention.  The patient's history has been reviewed, patient examined, no change in status, stable for surgery.  I have reviewed the patient's chart and labs.  Questions were answered to the patient's satisfaction.     Deatrice FALCON Regena Delucchi

## 2024-04-05 NOTE — Discharge Instructions (Signed)

## 2024-04-05 NOTE — Progress Notes (Signed)
 Hi Tanya ,  Can you please call the patient and tell the patient the ultrasound does suggest cirrhosis but without any signs of tumors or mass . I recommend ultrasound of the liver every 6 months for Screening purposes   Again its very important to maintain a healthy weight and blood glucose   Thanks,  Adien Kimmel Faizan Shavonte Zhao, MD Gastroenterology and Hepatology [facility] Gastroenterology\ ============== RUQ 03/2024  IMPRESSION: ULTRASOUND ABDOMEN:   1. Heterogeneous liver echotexture, increased echogenicity and surface nodularity, compatible with cirrhosis. 2. No focal liver lesion. 3. Mild splenomegaly. 4. Otherwise unremarkable exam.   ULTRASOUND HEPATIC ELASTOGRAPHY:   Median kPa:  4.4   Diagnostic category:  < or = 5 kPa: high probability of being normal

## 2024-04-05 NOTE — Transfer of Care (Signed)
 Immediate Anesthesia Transfer of Care Note  Patient: [name]  Procedure(s) Performed: COLONOSCOPY  Patient Location: PACU and Short Stay  Anesthesia Type:General  Level of Consciousness: awake  Airway & Oxygen Therapy: Patient Spontanous Breathing  Post-op Assessment: Report given to RN  Post vital signs: Reviewed and stable  Last Vitals:  Vitals Value Taken Time  BP 80/57 04/05/24 14:53  Temp 36.7 C 04/05/24 14:53  Pulse 77 04/05/24 14:53  Resp 12 04/05/24 14:53  SpO2 94 % 04/05/24 14:53    Last Pain:  Vitals:   04/05/24 1453  TempSrc: Oral  PainSc: 0-No pain      Patients Stated Pain Goal: 4 (04/05/24 1238)  Complications: No notable events documented.

## 2024-04-05 NOTE — Telephone Encounter (Signed)
-----   Message from Deatrice JULIANNA Dine sent at 04/05/2024  2:58 PM EST ----- Regarding: Reff Hi Braelin Costlow ,  Can you please arrange a referral for this patient to Genetic counseling   Diagnosis: High colon polyp burden    Thanks,  Muhammad Faizan Ahmed, MD Gastroenterology and Hepatology [facility] Gastroenterology

## 2024-04-05 NOTE — Telephone Encounter (Signed)
 Referral sent, they will contact patient with apt

## 2024-04-06 ENCOUNTER — Encounter (HOSPITAL_COMMUNITY): Payer: Self-pay | Admitting: Gastroenterology

## 2024-04-07 LAB — SURGICAL PATHOLOGY

## 2024-04-07 NOTE — Anesthesia Postprocedure Evaluation (Signed)
 Anesthesia Post Note  Patient: [name]  Procedure(s) Performed: COLONOSCOPY  Patient location during evaluation: Phase II Anesthesia Type: MAC Level of consciousness: awake Pain management: pain level controlled Vital Signs Assessment: post-procedure vital signs reviewed and stable Respiratory status: spontaneous breathing and respiratory function stable Cardiovascular status: blood pressure returned to baseline and stable Postop Assessment: no headache and no apparent nausea or vomiting Anesthetic complications: no Comments: Late entry   No notable events documented.   Last Vitals:  Vitals:   04/05/24 1500 04/05/24 1505  BP: (!) 78/52 (!) 111/50  Pulse: 79 80  Resp: 12 18  Temp:    SpO2: 95% 95%    Last Pain:  Vitals:   04/05/24 1505  TempSrc:   PainSc: 0-No pain                 Yvonna JINNY Bosworth

## 2024-04-10 ENCOUNTER — Ambulatory Visit (INDEPENDENT_AMBULATORY_CARE_PROVIDER_SITE_OTHER): Payer: Self-pay | Admitting: Gastroenterology

## 2024-04-10 ENCOUNTER — Telehealth (HOSPITAL_BASED_OUTPATIENT_CLINIC_OR_DEPARTMENT_OTHER): Payer: Self-pay

## 2024-04-10 ENCOUNTER — Telehealth: Payer: Self-pay | Admitting: *Deleted

## 2024-04-10 DIAGNOSIS — K4090 Unilateral inguinal hernia, without obstruction or gangrene, not specified as recurrent: Secondary | ICD-10-CM

## 2024-04-10 NOTE — Telephone Encounter (Signed)
    Primary Cardiologist:Branch, Dorn, MD  Chart reviewed as part of pre-operative protocol coverage. Because of [name] past medical history and time since last visit, he/she will require a follow-up  office visit in order to better assess preoperative cardiovascular risk.  Pre-op covering staff: - Please schedule appointment and call patient to inform them. - Please contact requesting surgeon's office via preferred method (i.e, phone, fax) to inform them of need for appointment prior to surgery.  If applicable, this message will also be routed to pharmacy pool and/or primary cardiologist for input on holding anticoagulant/antiplatelet agent as requested below so that this information is available at time of patient's appointment.   Josefa CHRISTELLA Beauvais, NP  04/10/2024, 2:09 PM

## 2024-04-10 NOTE — Telephone Encounter (Signed)
   Pre-operative Risk Assessment    Patient Name: [name]  DOB: 1971/02/01 MRN: [id number]   Date of last office visit: 02/26/23 with Dr. Alvan Date of next office visit: 05/04/24 with Strader   Request for Surgical Clearance    Procedure:  XI Robotic Assisted Laparoscopic Inguinal Hernia Repair [place]   Date of Surgery:  Clearance TBD                                 Surgeon:  Dr. Mavis Socks Group or Practice Name:  [facility] Phone number:  [phone] Fax number:  [phone]   Type of Clearance Requested:   - Medical  - Pharmacy:  Hold Aspirin  not indicated, Farxiga  for 3 days and Ozempic  for 8 days.      Type of Anesthesia:  General    Additional requests/questions:    Signed, Augustin JONETTA Daring   04/10/2024, 11:35 AM

## 2024-04-10 NOTE — Telephone Encounter (Signed)
 Pt has appt 05/04/24 [name], PAC.

## 2024-04-10 NOTE — Telephone Encounter (Signed)
 Received call from patient [phone]~ telephone.   Patient reports that cardiology requires clearance request.   Sent to pool.

## 2024-04-11 NOTE — Progress Notes (Unsigned)
 Cardiology Office Note    Date:  04/12/2024  ID:  [name], DOB 02-Jan-1971, MRN [id number] Cardiologist: Alvan Carrier, MD Cardiology APP:  Johnson Laymon HERO, PA-C { : History of Present Illness:    [name] is a 53 y.o. male with past medical history of abnormal stress test (NST in 06/2021 showing a small area of ischemia but overall low-risk), RBBB, HTN, HLD, Stage 3 CKD and prior CVA who presents to the office today for annual follow-up and cardiac clearance.  He was last examined by Dr. Alvan in 02/2023 and denied any recent chest pain or dyspnea on exertion. He was continued on his current cardiac medications with ASA 81 mg daily, Atorvastatin  80 mg daily, Farxiga  5 mg daily and Kerendia  10 mg daily.  The office recently received a cardiac clearance request for robotic assisted laparoscopic inguinal hernia repair and a follow-up visit was arranged. Was previously recommended to hold Farxiga  for 3 days and Ozempic  for 8 days.  In talking with the patient today, he reports overall feeling well from a cardiac perspective since his last office visit. He tries to remain active at baseline and was previously going to the gym until a few months ago when this became limited given the pain from his hernia. He was able to lift weights and walk on the treadmill without any anginal symptoms. He has tried to remain active at home since and is using a foot bike along with walking in the neighborhood or at the grocery store for exercise. Denies any chest pain or dyspnea on exertion with this. No recent palpitations, orthopnea, PND or pitting edema.  Studies Reviewed:   EKG: EKG is ordered today and demonstrates:   EKG Interpretation Date/Time:  Wednesday April 12 2024 08:24:41 EST Ventricular Rate:  77 PR Interval:  176 QRS Duration:  170 QT Interval:  404 QTC Calculation: 457 R Axis:   269  Text Interpretation: Normal sinus rhythm Right bundle branch block Left anterior  fasicular block Confirmed by Johnson Laymon ([id number]) on 04/12/2024 8:35:25 AM       Echocardiogram: 01/2019 IMPRESSIONS     1. Left ventricular ejection fraction, by visual estimation, is 60 to  65%. The left ventricle has normal function. Normal left ventricular size.  There is moderately increased left ventricular hypertrophy.   2. Left ventricular diastolic Doppler parameters are consistent with  impaired relaxation pattern of LV diastolic filling.   3. Global right ventricle has normal systolic function.The right  ventricular size is normal. No increase in right ventricular wall  thickness.   4. Left atrial size was normal.   5. Right atrial size was normal.   6. The mitral valve is normal in structure. No evidence of mitral valve  regurgitation. No evidence of mitral stenosis.   7. The tricuspid valve is normal in structure. Tricuspid valve  regurgitation was not visualized by color flow Doppler.   8. The aortic valve is normal in structure. Aortic valve regurgitation  was not visualized by color flow Doppler. Structurally normal aortic  valve, with no evidence of sclerosis or stenosis.   9. The pulmonic valve was normal in structure. Pulmonic valve  regurgitation is not visualized by color flow Doppler.  10. The inferior vena cava is normal in size with greater than 50%  respiratory variability, suggesting right atrial pressure of 3 mmHg.   NST: 06/2021   Findings are consistent with ischemia. The study is low risk.   No ST deviation was  noted.   LV perfusion is abnormal. There is evidence of ischemia. There is no evidence of infarction. Defect 1: There is a medium defect with mild reduction in uptake present in the inferoseptal location(s) that is reversible.   Left ventricular function is normal. End diastolic cavity size is normal. End systolic cavity size is normal.   Diaphragmatic attenuation and motion artifact Baseline ECG RBBB/LAFB SDS 6 with area of inferior  septal/apical ischemia EF normal 68%   Physical Exam:   VS:  BP 110/70 (BP Location: Left Arm, Cuff Size: Large)   Pulse 76   Ht 5' 9 (1.753 m)   Wt 251 lb 9.6 oz (114.1 kg)   SpO2 98%   BMI 37.15 kg/m    Wt Readings from Last 3 Encounters:  04/12/24 251 lb 9.6 oz (114.1 kg)  03/30/24 256 lb (116.1 kg)  03/20/24 252 lb 9.6 oz (114.6 kg)     GEN: Well nourished, well developed male appearing in no acute distress NECK: No JVD; No carotid bruits CARDIAC: RRR, no murmurs, rubs, gallops RESPIRATORY:  Clear to auscultation without rales, wheezing or rhonchi  ABDOMEN: Appears non-distended. No obvious abdominal masses. EXTREMITIES: No clubbing or cyanosis. No pitting edema.  Distal pedal pulses are 2+ bilaterally.   Assessment and Plan:   1. Preoperative cardiovascular examination - He is able to perform more than 4 METS of activity without any anginal symptoms. RCRI risk is overall low at 1.1% risk of a major cardiac event. EKG today is similar to prior tracings with no acute changes. At this time, he does not require further cardiac testing prior to his upcoming surgery and is cleared to proceed.  - He can hold ASA for 5 days if needed and should hold Farxiga  for 3 days prior to surgery and Ozempic  for at least 1 week. Will forward today's note to the requesting provider.  2. Abnormal EKG - He has a known history of RBBB and NST in 06/2021 showed a mild area of potential ischemia but was a low-risk study. He is active at baseline and denies any recent anginal symptoms. - Continue with risk factor modification. Remains on ASA 81 mg daily, Farxiga  5 mg daily and Atorvastatin  80 mg daily.  3. HLD - Followed by his PCP.  FLP in 06/2023 showed total cholesterol 157, triglycerides 398, HDL 39 and LDL 57. Reviewed the importance of limiting saturated fats in his diet. Continue current medical therapy with Atorvastatin  80 mg daily.   4. Stage 3 CKD - By review of [facility] DXA, creatinine  was at 1.94 when checked in 02/2024. Remains on Farxiga  5 mg daily and Kerendia  10 mg daily.  Signed, Laymon CHRISTELLA Qua, PA-C

## 2024-04-12 ENCOUNTER — Other Ambulatory Visit: Payer: Self-pay

## 2024-04-12 ENCOUNTER — Encounter: Payer: Self-pay | Admitting: Student

## 2024-04-12 ENCOUNTER — Ambulatory Visit: Attending: Student | Admitting: Student

## 2024-04-12 VITALS — BP 110/70 | HR 76 | Ht 69.0 in | Wt 251.6 lb

## 2024-04-12 DIAGNOSIS — E785 Hyperlipidemia, unspecified: Secondary | ICD-10-CM | POA: Diagnosis not present

## 2024-04-12 DIAGNOSIS — R030 Elevated blood-pressure reading, without diagnosis of hypertension: Secondary | ICD-10-CM | POA: Diagnosis not present

## 2024-04-12 DIAGNOSIS — N1832 Chronic kidney disease, stage 3b: Secondary | ICD-10-CM | POA: Insufficient documentation

## 2024-04-12 DIAGNOSIS — E6609 Other obesity due to excess calories: Secondary | ICD-10-CM | POA: Diagnosis not present

## 2024-04-12 DIAGNOSIS — R9431 Abnormal electrocardiogram [ECG] [EKG]: Secondary | ICD-10-CM | POA: Insufficient documentation

## 2024-04-12 DIAGNOSIS — Z01810 Encounter for preprocedural cardiovascular examination: Secondary | ICD-10-CM | POA: Diagnosis not present

## 2024-04-12 DIAGNOSIS — F419 Anxiety disorder, unspecified: Secondary | ICD-10-CM

## 2024-04-12 DIAGNOSIS — M160 Bilateral primary osteoarthritis of hip: Secondary | ICD-10-CM | POA: Diagnosis not present

## 2024-04-12 DIAGNOSIS — E1121 Type 2 diabetes mellitus with diabetic nephropathy: Secondary | ICD-10-CM | POA: Diagnosis not present

## 2024-04-12 DIAGNOSIS — M62830 Muscle spasm of back: Secondary | ICD-10-CM | POA: Diagnosis not present

## 2024-04-12 DIAGNOSIS — M51369 Other intervertebral disc degeneration, lumbar region without mention of lumbar back pain or lower extremity pain: Secondary | ICD-10-CM | POA: Diagnosis not present

## 2024-04-12 DIAGNOSIS — Z6837 Body mass index (BMI) 37.0-37.9, adult: Secondary | ICD-10-CM | POA: Diagnosis not present

## 2024-04-12 NOTE — Patient Instructions (Addendum)
 Hold Ozempic  for 1 week prior to surgery.   Hold Aspirin  for 5 days prior surgery.   Hold Farxiga  for 3 days prior to surgery.   Medication Instructions:  Your physician recommends that you continue on your current medications as directed. Please refer to the Current Medication list given to you today.  *If you need a refill on your cardiac medications before your next appointment, please call your pharmacy*  Lab Work: NONE   If you have labs (blood work) drawn today and your tests are completely normal, you will receive your results only by: MyChart Message (if you have MyChart) OR A paper copy in the mail If you have any lab test that is abnormal or we need to change your treatment, we will call you to review the results.  Testing/Procedures: NONE   Follow-Up: At [facility], you and your health needs are our priority.  As part of our continuing mission to provide you with exceptional heart care, our providers are all part of one team.  This team includes your primary Cardiologist (physician) and Advanced Practice Providers or APPs (Physician Assistants and Nurse Practitioners) who all work together to provide you with the care you need, when you need it.  Your next appointment:   1 year(s)  Provider:   You may see Alvan Carrier, MD or one of the following Advanced Practice Providers on your designated Care Team:   Laymon Qua, PA-C  [place], [place] Olivia [place], [place]     We recommend signing up for the patient portal called MyChart.  Sign up information is provided on this After Visit Summary.  MyChart is used to connect with patients for Virtual Visits (Telemedicine).  Patients are able to view lab/test results, encounter notes, upcoming appointments, etc.  Non-urgent messages can be sent to your provider as well.   To learn more about what you can do with MyChart, go to [email or web address].   Other Instructions Thank you for choosing [facility]!

## 2024-04-12 NOTE — Telephone Encounter (Signed)
 Received cardiac clearance:  . Preoperative cardiovascular examination - He is able to perform more than 4 METS of activity without any anginal symptoms. RCRI risk is overall low at 1.1% risk of a major cardiac event. EKG today is similar to prior tracings with no acute changes. At this time, he does not require further cardiac testing prior to his upcoming surgery and is cleared to proceed.  - He can hold ASA for 5 days if needed and should hold Farxiga  for 3 days prior to surgery and Ozempic  for at least 1 week. Will forward today's note to the requesting provider.  Patient would like to proceed with surgery scheduling for XI ROBOTIC ASSISTED LAPAROSCOPIC INGUINAL HERNIA REPAIR W/ MESH, LEFT

## 2024-04-13 NOTE — Progress Notes (Signed)
 1 yr TCS noted in recall Patient result letter mailed Patient's PCP is on EPIC .

## 2024-04-13 NOTE — Telephone Encounter (Signed)
 Discussed with Dr. Mavis.   Agreeable to 04/27/2024, 04/28/2024.  Multiple calls placed to patient with no answer and no return call.   Message to be closed. MyChart message sent to patient.

## 2024-04-14 NOTE — Telephone Encounter (Signed)
 Received call from patient and procedure date.   Procedure scheduled.

## 2024-04-14 NOTE — Addendum Note (Signed)
 Addended by: SAUNDRA TAWNI DEL on: 04/14/2024 10:17 AM   Modules accepted: Orders

## 2024-04-18 NOTE — H&P (Signed)
 [name]; [id number]; [birth year]   HPI Patient is a 53 year old white male who is referred to my care by Leita Mounts for evaluation and treatment of a left inguinal hernia as well as an umbilical hernia.  Patient states he is unaware of having an umbilical hernia.  He does have a left inguinal hernia that occasionally bothers him.  Has had it for some time now, but he has noticed increasing discomfort when straining.  He has never had an episode of incarceration.  He has other medical issues that are being worked up including possible cirrhosis for which he is seeing GI and history of heart block which he is following cardiology.  He has seen cardiology in December.  He is status post an abdominoplasty in the remote past. Past Medical History:  Diagnosis Date   ALLERGY 08/03/2008   Qualifier: Diagnosis of  By: Bettie Slough     Anemia    Arthritis    hips (01/15/2015)   Blood transfusion without reported diagnosis    Cellulitis 07/25/2018   Chronic back pain greater than 3 months duration 02/11/2017   Chronic lower back pain    Colon polyps    COLONIC POLYPS, ADENOMATOUS 08/03/2008   Qualifier: Diagnosis of  By: Bettie Slough     CVA 08/03/2008   Qualifier: History of  By: Bettie Slough     Depression    denies   Diabetes mellitus    diet controlled- no meds since wt loss   Diabetes mellitus (HCC) 07/10/2019   Dysrhythmia    s/p surgery bariatric- cardioversion   Erectile dysfunction 01/26/2018   GERD (gastroesophageal reflux disease)    HIATAL HERNIA 08/03/2008   Qualifier: Diagnosis of  By: Bettie Slough     Hip pain    History of gout    Hyperlipidemia    Hypertension    Joint pain    Narcotic dependence (HCC) 02/11/2017   Neuromuscular disorder (HCC)    PONV (postoperative nausea and vomiting)    Prediabetes 03/29/2017   Sleep apnea    no longer have to use cpap since wt loss   Stage 3 chronic kidney disease (HCC) 01/26/2018   Stroke (HCC) 2006    denies residual on 01/15/2015   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 08/01/2018    Past Surgical History:  Procedure Laterality Date   BARIATRIC SURGERY  2010   in [place]   BIOPSY  02/26/2021   Procedure: BIOPSY;  Surgeon: Eartha Angelia Sieving, MD;  Location: [facility];  Service: Gastroenterology;;   CARDIOVERSION     COLONOSCOPY N/A 08/02/2014   Procedure: COLONOSCOPY;  Surgeon: Claudis RAYMOND Rivet, MD;  Location: [facility];  Service: Endoscopy;  Laterality: N/A;  210 - moved to 3/10 @ 11:55 - Ann notified pt   COLONOSCOPY WITH PROPOFOL  N/A 02/26/2021   Procedure: COLONOSCOPY WITH PROPOFOL ;  Surgeon: Eartha Angelia Sieving, MD;  Location: [facility];  Service: Gastroenterology;  Laterality: N/A;  10:00   ESOPHAGOGASTRODUODENOSCOPY     ESOPHAGOGASTRODUODENOSCOPY (EGD) WITH PROPOFOL  N/A 02/26/2021   Procedure: ESOPHAGOGASTRODUODENOSCOPY (EGD) WITH PROPOFOL ;  Surgeon: Eartha Angelia Sieving, MD;  Location: [facility];  Service: Gastroenterology;  Laterality: N/A;   HERNIA REPAIR     LIPOSUCTION     PANNICULECTOMY  01/14/2015   PANNICULECTOMY N/A 01/14/2015   Procedure:  TOTAL PANNICULECTOMY WTH LIPOSUCTION OF SIDES;  Surgeon: Elna Pick, MD;  Location: [facility];  Service: Plastics;  Laterality: N/A;   POLYPECTOMY  02/26/2021   Procedure: POLYPECTOMY INTESTINAL;  Surgeon: Eartha Angelia Sieving, MD;  Location: [facility];  Service: Gastroenterology;;   TONSILLECTOMY     TOTAL HIP ARTHROPLASTY Left 12/16/2021   Procedure: TOTAL HIP ARTHROPLASTY ANTERIOR APPROACH;  Surgeon: Beverley Evalene BIRCH, MD;  Location: [facility];  Service: Orthopedics;  Laterality: Left;   TOTAL HIP ARTHROPLASTY Right 03/03/2022   Procedure: TOTAL HIP ARTHROPLASTY ANTERIOR APPROACH;  Surgeon: Beverley Evalene BIRCH, MD;  Location: [facility];  Service: Orthopedics;  Laterality: Right;   VENTRAL HERNIA REPAIR  01/14/2015   VENTRAL HERNIA REPAIR N/A 01/14/2015   Procedure: HERNIA REPAIR VENTRAL  ADULT AND MUSCLE REPAIR;  Surgeon: Elna Pick, MD;  Location: [facility];  Service: Plastics;  Laterality: N/A;    Family History  Problem Relation Age of Onset   COPD Mother    Depression Mother    Hyperlipidemia Mother    Hypertension Mother    Heart disease Mother    Thyroid  disease Mother    Anxiety disorder Mother    Diabetes Father    Pulmonary embolism Father 72       blood clot   Hyperlipidemia Father    Sudden death Father    Obesity Father    Eating disorder Father    Hypertension Brother     Current Outpatient Medications on File Prior to Visit  Medication Sig Dispense Refill   aspirin  EC 81 MG tablet Take 81 mg by mouth daily. Swallow whole.     atorvastatin  (LIPITOR) 80 MG tablet TAKE ONE TABLET BY MOUTH EVERY DAY 90 tablet 1   blood glucose meter kit and supplies KIT Test daily Dx  r73.03 1 each 0   Blood Glucose Monitoring Suppl DEVI 1 each by Does not apply route daily. May substitute to any manufacturer covered by patient's insurance. 1 each 0   Blood Pressure Monitor KIT 1 each by Does not apply route daily. 1 kit 0   eszopiclone  (LUNESTA ) 2 MG TABS tablet TAKE ONE TABLET BY MOUTH AT BEDTIME AS NEEDED 30 tablet 0   FARXIGA  5 MG TABS tablet TAKE (1) TABLET BY MOUTH EACH MORNING. 90 tablet 0   Glucose Blood (BLOOD GLUCOSE TEST STRIPS) STRP Once daily testing May substitute to any manufacturer covered by patient's insurance. 100 strip 2   glucose blood test strip Use as instructed. Can check up to 4 times daily. 100 each 3   hydrOXYzine  (VISTARIL ) 25 MG capsule TAKE ONE CAPSULE BY MOUTH EVERY 8 HOURS AS NEEDED FOR ANXIETY 30 capsule 2   KERENDIA  10 MG TABS Take 10 mg by mouth daily.     Lancet Device MISC Once daily dx e11.9 May substitute to any manufacturer covered by patient's insurance. 1 each 0   latanoprost  (XALATAN ) 0.005 % ophthalmic solution Place 1 drop into both eyes at bedtime.     Semaglutide , 2 MG/DOSE, (OZEMPIC , 2 MG/DOSE,) 8 MG/3ML SOPN INJECT 2mg   into skin once weekly 3 mL 2   sildenafil  (VIAGRA ) 50 MG tablet Take 1 tablet (50 mg total) by mouth daily as needed for erectile dysfunction. 20 tablet 0   timolol  (BETIMOL ) 0.5 % ophthalmic solution Place 1 drop into both eyes in the morning.     No current facility-administered medications on file prior to visit.    No Known Allergies  Social History   Substance and Sexual Activity  Alcohol Use No    Social History   Tobacco Use  Smoking Status Never  Smokeless Tobacco Never  Review of Systems  Constitutional: Negative.   HENT: Negative.    Eyes: Negative.   Respiratory: Negative.    Cardiovascular: Negative.   Gastrointestinal: Negative.   Genitourinary: Negative.   Musculoskeletal: Negative.   Skin: Negative.   Neurological: Negative.   Endo/Heme/Allergies: Negative.   Psychiatric/Behavioral: Negative.      Objective   Vitals:   03/30/24 1118  BP: 117/79  Pulse: 83  Resp: 16  Temp: (!) 97.5 F (36.4 C)  SpO2: 97%    Physical Exam Vitals reviewed.  Constitutional:      Appearance: Normal appearance. He is not ill-appearing.  HENT:     Head: Normocephalic and atraumatic.  Cardiovascular:     Rate and Rhythm: Normal rate and regular rhythm.     Heart sounds: Normal heart sounds. No murmur heard.    No friction rub. No gallop.  Pulmonary:     Effort: Pulmonary effort is normal. No respiratory distress.     Breath sounds: Normal breath sounds. No stridor. No wheezing, rhonchi or rales.  Abdominal:     General: Bowel sounds are normal. There is no distension.     Palpations: Abdomen is soft. There is no mass.     Tenderness: There is no abdominal tenderness. There is no guarding or rebound.     Hernia: A hernia is present.     Comments: He has an easily reducible left inguinal hernia.  He has laxity at the umbilicus but no discrete hernia is appreciated.  Skin:    General: Skin is warm and dry.  Neurological:     Mental Status: He is alert and  oriented to person, place, and time.     Assessment  Left inguinal hernia History of heart block Plan  Patient will be seeing his cardiologist next month.  Will obtain cardiac clearance at that time.  He was instructed to return to my care after that visit to schedule a robotic assisted laparoscopic left inguinal herniorrhaphy with mesh.  The risks and benefits of the procedure including bleeding, infection, mesh use, bowel injury, and the possibility of recurrence of the hernia were fully explained to the patient, who gave informed consent.

## 2024-04-24 ENCOUNTER — Other Ambulatory Visit: Payer: Self-pay

## 2024-04-24 DIAGNOSIS — G4700 Insomnia, unspecified: Secondary | ICD-10-CM

## 2024-04-25 NOTE — Patient Instructions (Signed)
 [name]  04/25/2024     @PREFPERIOPPHARMACY @   Your procedure is scheduled on  04/28/2024.   Report to [facility] at  [phone] A.M.   Call this number if you have problems the morning of surgery:  [phone]  If you experience any cold or flu symptoms such as cough, fever, chills, shortness of breath, etc. between now and your scheduled surgery, please notify us  at the above number.   Remember:           Your last dose of ozempic  should have been on 04/20/2024.         Your last dose of farxiga  should have been on 04/24/2024.          DO NOT take any medications for diabetes the morning of your procedure.    Do not eat after midnight.   You may drink clear liquids until  0555 am on 04/28/2024.      Clear liquids allowed are:                    Water , Carbonated beverages (diabetics please choose diet or no sugar options), Black Coffee Only (No creamer, milk or cream, including half & half and powdered creamer), and Clear Sports drink (No red color; diabetics please choose diet or no sugar options)    Take these medicines the morning of surgery with A SIP OF WATER                                               hydroxyzine .    Do not wear jewelry, make-up or nail polish, including gel polish,  artificial nails, or any other type of covering on natural nails (fingers and  toes).  Do not wear lotions, powders, or perfumes, or deodorant.  Do not shave 48 hours prior to surgery.  Men may shave face and neck.  Do not bring valuables to the hospital.  [facility] is not responsible for any belongings or valuables.  Contacts, dentures or bridgework may not be worn into surgery.  Leave your suitcase in the car.  After surgery it may be brought to your room.  For patients admitted to the hospital, discharge time will be determined by your treatment team.  Patients discharged the day of surgery will not be allowed to drive home and must have someone with them for 24  hours.    Special instructions:  DO NOT smoke tobacco or vape for 24 hours before your procedure.  Please read over the following fact sheets that you were given. Coughing and Deep Breathing, Surgical Site Infection Prevention, Anesthesia Post-op Instructions, and Care and Recovery After Surgery      Laparoscopic Surgery for Groin Hernia in Adults: What to Expect  Laparoscopic surgery for groin hernia is a surgery to treat a weak spot in the groin muscles where tissue from inside your belly pushes out. Groin hernia is also called inguinal hernia.  This surgery may be planned or it may be done as an emergency surgery. During the surgery, tissue that has pushed out of the belly is moved back into place. The opening in the groin muscles is closed.  Laparoscopic surgery is done through small cuts in the belly. A scope with a light and camera (laparoscope) is used. Tell a health care provider about:  Any allergies you have. All medicines you're taking. These include vitamins, herbs, eye drops, and creams. Any problems you or family members have had with anesthesia. Any bleeding problems you have. Any surgeries you've had. Any medical problems you have. Whether you're pregnant or may be pregnant. What are the risks? Your health care provider will talk with you about risks. These may include: Infection. Bleeding, blood clots, or fluid build up in the area of the hernia. Allergy to medicines or the mesh, if a mesh was used. Damage to nearby structures in the belly. Long-term pain and swelling of the scrotum. Trouble peeing or pooping. The hernia coming back after the surgery. In some cases, your provider may need to switch from a laparoscopic surgery to one large cut in the groin (open surgery). You may need an open surgery if: You have a hernia that's hard to repair. You bleed a lot during the laparoscopic surgery. You have problems when gas is put into your belly. What happens  before? When to stop eating and drinking Eat and drink only as you've been told. You may be told this: 8 hours before your surgery Stop eating most foods. Do not eat meat, fried foods, or fatty foods. Eat only light foods such as toast or crackers. All liquids are OK except energy drinks and alcohol. 6 hours before your surgery Stop eating. Drink only clear liquids, such as water , clear fruit juice, black coffee, plain tea, and sports drinks. Do not drink energy drinks or alcohol. 2 hours before your surgery Stop drinking all liquids. You may be allowed to take medicines with small sips of water . If you do not eat and drink as told, your surgery may be delayed or canceled. Medicines Ask about changing or stopping: Any medicines you take. Any vitamins, herbs, or supplements you take. Do not take aspirin  or ibuprofen unless you're told to. Surgery safety For your safety, you may: Need to wash your skin with a soap that kills germs. Get antibiotics. Have your surgery site marked. Have hair removed at the surgery site. General instructions Do not smoke, vape, or use nicotine or tobacco for at least 4 weeks before your surgery. Ask if you'll be staying overnight in the hospital. If you'll be going home right after the surgery, plan to have a responsible adult: Drive you home from the hospital or clinic. You won't be allowed to drive. Stay with you for the time you're told. What happens during laparoscopic surgery for groin hernia? An IV will be put into a vein in your hand or arm. You may be given: A sedative to help you relax. Anesthesia to keep you from feeling pain. Three small cuts will be made in your belly. Gas will be put into your belly through one of the cuts. This will make it easier for your surgeon to see inside your belly during the surgery. A laparoscope will be put into one of the cuts in your belly. This will send pictures to a screen in the operating room. The  instruments needed for the surgery will be put through the other cuts. The tissue that make up the hernia may be removed or moved back into place. The edges of the hernia may be stitched together. A piece of mesh may be used to close the hernia. Stitches or clips will be used to keep the mesh in place. The instruments and laparoscope will be removed. Your cuts will be closed with stitches, skin glue, or tape strips. A  bandage may be placed over your cuts. These steps may vary. Ask what you can expect. What happens after? You'll be watched closely until you leave. This includes checking your pain level, blood pressure, heart rate, and breathing rate. You'll continue to receive fluids and medicines through an IV. Your IV will be removed when you can drink clear fluids. This information is not intended to replace advice given to you by your health care provider. Make sure you discuss any questions you have with your health care provider. Document Revised: 02/23/2023 Document Reviewed: 02/23/2023 Elsevier Patient Education  2025 Else[employer].General Anesthesia, Adult, Care After The following information offers guidance on how to care for yourself after your procedure. Your health care provider may also give you more specific instructions. If you have problems or questions, contact your health care provider. What can I expect after the procedure? After the procedure, it is common for people to: Have pain or discomfort at the IV site. Have nausea or vomiting. Have a sore throat or hoarseness. Have trouble concentrating. Feel cold or chills. Feel weak, sleepy, or tired (fatigue). Have soreness and body aches. These can affect parts of the body that were not involved in surgery. Follow these instructions at home: For the time period you were told by your health care provider:  Rest. Do not participate in activities where you could fall or become injured. Do not drive or use machinery. Do not  drink alcohol. Do not take sleeping pills or medicines that cause drowsiness. Do not make important decisions or sign legal documents. Do not take care of children on your own. General instructions Drink enough fluid to keep your urine pale yellow. If you have sleep apnea, surgery and certain medicines can increase your risk for breathing problems. Follow instructions from your health care provider about wearing your sleep device: Anytime you are sleeping, including during daytime naps. While taking prescription pain medicines, sleeping medicines, or medicines that make you drowsy. Return to your normal activities as told by your health care provider. Ask your health care provider what activities are safe for you. Take over-the-counter and prescription medicines only as told by your health care provider. Do not use any products that contain nicotine or tobacco. These products include cigarettes, chewing tobacco, and vaping devices, such as e-cigarettes. These can delay incision healing after surgery. If you need help quitting, ask your health care provider. Contact a health care provider if: You have nausea or vomiting that does not get better with medicine. You vomit every time you eat or drink. You have pain that does not get better with medicine. You cannot urinate or have bloody urine. You develop a skin rash. You have a fever. Get help right away if: You have trouble breathing. You have chest pain. You vomit blood. These symptoms may be an emergency. Get help right away. Call 911. Do not wait to see if the symptoms will go away. Do not drive yourself to the hospital. Summary After the procedure, it is common to have a sore throat, hoarseness, nausea, vomiting, or to feel weak, sleepy, or fatigue. For the time period you were told by your health care provider, do not drive or use machinery. Get help right away if you have difficulty breathing, have chest pain, or vomit blood. These  symptoms may be an emergency. This information is not intended to replace advice given to you by your health care provider. Make sure you discuss any questions you have with your health care  provider. Document Revised: 08/08/2021 Document Reviewed: 08/08/2021 Elsevier Patient Education  2024 Else[employer] Inc.How to Use Chlorhexidine  at Home in the Shower Chlorhexidine  gluconate (CHG) is a germ-killing (antiseptic) wash that's used to clean the skin. It can get rid of the germs that normally live on the skin and can keep them away for about 24 hours. If you're having surgery, you may be told to shower with CHG at home the night before surgery. This can help lower your risk for infection. To use CHG wash in the shower, follow the steps below. Supplies needed: CHG body wash. Clean washcloth. Clean towel. How to use CHG in the shower Follow these steps unless you're told to use CHG in a different way: Start the shower. Use your normal soap and shampoo to wash your face and hair. Turn off the shower or move out of the shower stream. Pour CHG onto a clean washcloth. Do not use any type of brush or rough sponge. Start at your neck, washing your body down to your toes. Make sure you: Wash the part of your body where the surgery will be done for at least 1 minute. Do not scrub. Do not use CHG on your head or face unless your health care provider tells you to. If it gets into your ears or eyes, rinse them well with water . Do not wash your genitals with CHG. Wash your back and under your arms. Make sure to wash skin folds. Let the CHG sit on your skin for 1-2 minutes or as long as told. Rinse your entire body in the shower, including all body creases and folds. Turn off the shower. Dry off with a clean towel. Do not put anything on your skin afterward, such as powder, lotion, or perfume. Put on clean clothes or pajamas. If it's the night before surgery, sleep in clean sheets. General tips Use CHG  only as told, and follow the instructions on the label. Use the full amount of CHG as told. This is often one bottle. Do not smoke and stay away from flames after using CHG. Your skin may feel sticky after using CHG. This is normal. The sticky feeling will go away as the CHG dries. Do not use CHG: If you have a chlorhexidine  allergy or have reacted to chlorhexidine  in the past. On open wounds or areas of skin that have broken skin, cuts, or scrapes. On babies younger than 14 months of age. Contact a health care provider if: You have questions about using CHG. Your skin gets irritated or itchy. You have a rash after using CHG. You swallow any CHG. Call your local poison control center [phone] in the [place] Your eyes itch badly, or they become very red or swollen. Your hearing changes. You have trouble seeing. If you can't reach your provider, go to an urgent care or emergency room. Do not drive yourself. Get help right away if: You have swelling or tingling in your mouth or throat. You make high-pitched whistling sounds when you breathe, most often when you breathe out (wheeze). You have trouble breathing. These symptoms may be an emergency. Call 911 right away. Do not wait to see if the symptoms will go away. Do not drive yourself to the hospital. This information is not intended to replace advice given to you by your health care provider. Make sure you discuss any questions you have with your health care provider. Document Revised: 11/24/2022 Document Reviewed: 11/20/2021 Elsevier Patient Education  2024 [employer].

## 2024-04-26 ENCOUNTER — Encounter (HOSPITAL_COMMUNITY)
Admission: RE | Admit: 2024-04-26 | Discharge: 2024-04-26 | Disposition: A | Source: Ambulatory Visit | Attending: General Surgery

## 2024-04-26 ENCOUNTER — Encounter (HOSPITAL_COMMUNITY): Payer: Self-pay

## 2024-04-26 VITALS — BP 110/70 | HR 76 | Resp 18 | Ht 69.0 in | Wt 251.6 lb

## 2024-04-26 DIAGNOSIS — Z01812 Encounter for preprocedural laboratory examination: Secondary | ICD-10-CM | POA: Diagnosis not present

## 2024-04-26 DIAGNOSIS — E1121 Type 2 diabetes mellitus with diabetic nephropathy: Secondary | ICD-10-CM

## 2024-04-26 DIAGNOSIS — Z01818 Encounter for other preprocedural examination: Secondary | ICD-10-CM

## 2024-04-26 HISTORY — DX: Fatty (change of) liver, not elsewhere classified: K76.0

## 2024-04-26 LAB — CBC WITH DIFFERENTIAL/PLATELET
Abs Immature Granulocytes: 0.02 K/uL (ref 0.00–0.07)
Basophils Absolute: 0 K/uL (ref 0.0–0.1)
Basophils Relative: 0 %
Eosinophils Absolute: 0.3 K/uL (ref 0.0–0.5)
Eosinophils Relative: 3 %
HCT: 46.9 % (ref 39.0–52.0)
Hemoglobin: 16.5 g/dL (ref 13.0–17.0)
Immature Granulocytes: 0 %
Lymphocytes Relative: 15 %
Lymphs Abs: 1.5 K/uL (ref 0.7–4.0)
MCH: 31.7 pg (ref 26.0–34.0)
MCHC: 35.2 g/dL (ref 30.0–36.0)
MCV: 90 fL (ref 80.0–100.0)
Monocytes Absolute: 1.1 K/uL — ABNORMAL HIGH (ref 0.1–1.0)
Monocytes Relative: 11 %
Neutro Abs: 6.8 K/uL (ref 1.7–7.7)
Neutrophils Relative %: 71 %
Platelets: 144 K/uL — ABNORMAL LOW (ref 150–400)
RBC: 5.21 MIL/uL (ref 4.22–5.81)
RDW: 12.6 % (ref 11.5–15.5)
WBC: 9.7 K/uL (ref 4.0–10.5)
nRBC: 0 % (ref 0.0–0.2)

## 2024-04-26 LAB — BASIC METABOLIC PANEL WITH GFR
Anion gap: 13 (ref 5–15)
BUN: 23 mg/dL — ABNORMAL HIGH (ref 6–20)
CO2: 18 mmol/L — ABNORMAL LOW (ref 22–32)
Calcium: 8.9 mg/dL (ref 8.9–10.3)
Chloride: 103 mmol/L (ref 98–111)
Creatinine, Ser: 1.82 mg/dL — ABNORMAL HIGH (ref 0.61–1.24)
GFR, Estimated: 44 mL/min — ABNORMAL LOW (ref >60)
Glucose, Bld: 145 mg/dL — ABNORMAL HIGH (ref 70–99)
Potassium: 4.1 mmol/L (ref 3.5–5.1)
Sodium: 135 mmol/L (ref 135–145)

## 2024-04-27 LAB — HEMOGLOBIN A1C
Hgb A1c MFr Bld: 6.5 % — ABNORMAL HIGH (ref 4.8–5.6)
Mean Plasma Glucose: 140 mg/dL

## 2024-04-28 DIAGNOSIS — Z01818 Encounter for other preprocedural examination: Secondary | ICD-10-CM

## 2024-05-04 ENCOUNTER — Inpatient Hospital Stay (HOSPITAL_COMMUNITY): Admission: RE | Admit: 2024-05-04 | Discharge: 2024-05-04 | Attending: General Surgery

## 2024-05-04 ENCOUNTER — Ambulatory Visit: Admitting: Student

## 2024-05-04 ENCOUNTER — Encounter (HOSPITAL_COMMUNITY): Payer: Self-pay

## 2024-05-05 ENCOUNTER — Encounter (HOSPITAL_COMMUNITY): Admission: RE | Disposition: A | Payer: Self-pay | Source: Home / Self Care | Attending: General Surgery

## 2024-05-05 ENCOUNTER — Ambulatory Visit (HOSPITAL_COMMUNITY): Admitting: Certified Registered"

## 2024-05-05 ENCOUNTER — Ambulatory Visit (HOSPITAL_COMMUNITY)
Admission: RE | Admit: 2024-05-05 | Discharge: 2024-05-05 | Disposition: A | Attending: General Surgery | Admitting: General Surgery

## 2024-05-05 DIAGNOSIS — K4090 Unilateral inguinal hernia, without obstruction or gangrene, not specified as recurrent: Secondary | ICD-10-CM | POA: Diagnosis not present

## 2024-05-05 DIAGNOSIS — Z01818 Encounter for other preprocedural examination: Secondary | ICD-10-CM

## 2024-05-05 DIAGNOSIS — Z7985 Long-term (current) use of injectable non-insulin antidiabetic drugs: Secondary | ICD-10-CM | POA: Insufficient documentation

## 2024-05-05 DIAGNOSIS — K219 Gastro-esophageal reflux disease without esophagitis: Secondary | ICD-10-CM | POA: Insufficient documentation

## 2024-05-05 DIAGNOSIS — Z7984 Long term (current) use of oral hypoglycemic drugs: Secondary | ICD-10-CM | POA: Insufficient documentation

## 2024-05-05 DIAGNOSIS — N183 Chronic kidney disease, stage 3 unspecified: Secondary | ICD-10-CM | POA: Insufficient documentation

## 2024-05-05 DIAGNOSIS — G4730 Sleep apnea, unspecified: Secondary | ICD-10-CM | POA: Insufficient documentation

## 2024-05-05 DIAGNOSIS — Z7982 Long term (current) use of aspirin: Secondary | ICD-10-CM | POA: Insufficient documentation

## 2024-05-05 DIAGNOSIS — I10 Essential (primary) hypertension: Secondary | ICD-10-CM | POA: Diagnosis not present

## 2024-05-05 DIAGNOSIS — I129 Hypertensive chronic kidney disease with stage 1 through stage 4 chronic kidney disease, or unspecified chronic kidney disease: Secondary | ICD-10-CM | POA: Insufficient documentation

## 2024-05-05 DIAGNOSIS — Z9884 Bariatric surgery status: Secondary | ICD-10-CM | POA: Insufficient documentation

## 2024-05-05 DIAGNOSIS — Z8673 Personal history of transient ischemic attack (TIA), and cerebral infarction without residual deficits: Secondary | ICD-10-CM | POA: Insufficient documentation

## 2024-05-05 DIAGNOSIS — E1122 Type 2 diabetes mellitus with diabetic chronic kidney disease: Secondary | ICD-10-CM | POA: Insufficient documentation

## 2024-05-05 HISTORY — PX: XI ROBOTIC ASSISTED INGUINAL HERNIA REPAIR WITH MESH: SHX6706

## 2024-05-05 LAB — GLUCOSE, CAPILLARY: Glucose-Capillary: 124 mg/dL — ABNORMAL HIGH (ref 70–99)

## 2024-05-05 SURGERY — REPAIR, HERNIA, INGUINAL, ROBOT-ASSISTED, LAPAROSCOPIC, USING MESH
Anesthesia: General | Site: Abdomen | Laterality: Left

## 2024-05-05 MED ORDER — PHENYLEPHRINE HCL-NACL 20-0.9 MG/250ML-% IV SOLN
INTRAVENOUS | Status: DC | PRN
  Administered 2024-05-05 (×2): 80 ug via INTRAVENOUS

## 2024-05-05 MED ORDER — OXYCODONE HCL 5 MG PO TABS: 5.0000 mg | ORAL_TABLET | Freq: Once | ORAL | Status: DC | PRN

## 2024-05-05 MED ORDER — BUPIVACAINE HCL (PF) 0.5 % IJ SOLN
INTRAMUSCULAR | Status: DC | PRN
  Administered 2024-05-05: 30 mL

## 2024-05-05 MED ORDER — LIDOCAINE 2% (20 MG/ML) 5 ML SYRINGE
INTRAMUSCULAR | Status: AC
  Filled 2024-05-05: qty 5

## 2024-05-05 MED ORDER — ONDANSETRON HCL 4 MG/2ML IJ SOLN
INTRAMUSCULAR | Status: AC
  Filled 2024-05-05: qty 2

## 2024-05-05 MED ORDER — ONDANSETRON HCL 4 MG/2ML IJ SOLN: 4.0000 mg | Freq: Once | INTRAMUSCULAR | Status: DC | PRN

## 2024-05-05 MED ORDER — SUGAMMADEX SODIUM 200 MG/2ML IV SOLN
INTRAVENOUS | Status: DC | PRN
  Administered 2024-05-05: 200 mg via INTRAVENOUS

## 2024-05-05 MED ORDER — STERILE WATER FOR IRRIGATION IR SOLN
Status: DC | PRN
  Administered 2024-05-05: 1000 mL

## 2024-05-05 MED ORDER — MIDAZOLAM HCL (PF) 2 MG/2ML IJ SOLN
INTRAMUSCULAR | Status: DC | PRN
  Administered 2024-05-05: 2 mg via INTRAVENOUS

## 2024-05-05 MED ORDER — DEXAMETHASONE SOD PHOSPHATE PF 10 MG/ML IJ SOLN
INTRAMUSCULAR | Status: DC | PRN
  Administered 2024-05-05: 10 mg via INTRAVENOUS

## 2024-05-05 MED ORDER — OXYCODONE HCL 5 MG/5ML PO SOLN: 5.0000 mg | Freq: Once | ORAL | Status: DC | PRN

## 2024-05-05 MED ORDER — PROPOFOL 10 MG/ML IV BOLUS
INTRAVENOUS | Status: DC | PRN
  Administered 2024-05-05: 180 mg via INTRAVENOUS

## 2024-05-05 MED ORDER — ORAL CARE MOUTH RINSE: 15.0000 mL | Freq: Once | OROMUCOSAL | Status: AC

## 2024-05-05 MED ORDER — PHENYLEPHRINE 80 MCG/ML (10ML) SYRINGE FOR IV PUSH (FOR BLOOD PRESSURE SUPPORT)
PREFILLED_SYRINGE | INTRAVENOUS | Status: AC
  Filled 2024-05-05: qty 10

## 2024-05-05 MED ORDER — DEXMEDETOMIDINE HCL IN NACL 80 MCG/20ML IV SOLN
INTRAVENOUS | Status: DC | PRN
  Administered 2024-05-05: 12 ug via INTRAVENOUS

## 2024-05-05 MED ORDER — FENTANYL CITRATE (PF) 100 MCG/2ML IJ SOLN
INTRAMUSCULAR | Status: DC | PRN
  Administered 2024-05-05: 50 ug via INTRAVENOUS
  Administered 2024-05-05: 100 ug via INTRAVENOUS
  Administered 2024-05-05 (×2): 50 ug via INTRAVENOUS

## 2024-05-05 MED ORDER — ONDANSETRON HCL 4 MG/2ML IJ SOLN
INTRAMUSCULAR | Status: DC | PRN
  Administered 2024-05-05: 4 mg via INTRAVENOUS

## 2024-05-05 MED ORDER — MIDAZOLAM HCL 2 MG/2ML IJ SOLN
INTRAMUSCULAR | Status: AC
  Filled 2024-05-05: qty 2

## 2024-05-05 MED ORDER — CEFAZOLIN SODIUM-DEXTROSE 2-4 GM/100ML-% IV SOLN
2.0000 g | INTRAVENOUS | Status: AC
  Administered 2024-05-05: 2 g via INTRAVENOUS
  Filled 2024-05-05: qty 100

## 2024-05-05 MED ORDER — LIDOCAINE HCL (CARDIAC) PF 100 MG/5ML IV SOSY
PREFILLED_SYRINGE | INTRAVENOUS | Status: DC | PRN
  Administered 2024-05-05: 100 mg via INTRATRACHEAL

## 2024-05-05 MED ORDER — FENTANYL CITRATE (PF) 250 MCG/5ML IJ SOLN
INTRAMUSCULAR | Status: AC
  Filled 2024-05-05: qty 5

## 2024-05-05 MED ORDER — GLYCOPYRROLATE PF 0.2 MG/ML IJ SOSY
PREFILLED_SYRINGE | INTRAMUSCULAR | Status: AC
  Filled 2024-05-05: qty 1

## 2024-05-05 MED ORDER — ROCURONIUM BROMIDE 10 MG/ML (PF) SYRINGE
PREFILLED_SYRINGE | INTRAVENOUS | Status: DC | PRN
  Administered 2024-05-05: 20 mg via INTRAVENOUS
  Administered 2024-05-05: 80 mg via INTRAVENOUS

## 2024-05-05 MED ORDER — CHLORHEXIDINE GLUCONATE CLOTH 2 % EX PADS: 6.0000 | MEDICATED_PAD | Freq: Once | CUTANEOUS | Status: DC

## 2024-05-05 MED ORDER — CHLORHEXIDINE GLUCONATE 0.12 % MT SOLN
15.0000 mL | Freq: Once | OROMUCOSAL | Status: AC
  Administered 2024-05-05: 15 mL via OROMUCOSAL
  Filled 2024-05-05: qty 15

## 2024-05-05 MED ORDER — FENTANYL CITRATE (PF) 50 MCG/ML IJ SOSY: 25.0000 ug | PREFILLED_SYRINGE | INTRAMUSCULAR | Status: DC | PRN

## 2024-05-05 MED ORDER — LACTATED RINGERS IV SOLN: INTRAVENOUS | Status: DC

## 2024-05-05 MED ORDER — GLYCOPYRROLATE 0.2 MG/ML IJ SOLN
INTRAMUSCULAR | Status: DC | PRN
  Administered 2024-05-05: .1 mg via INTRAVENOUS

## 2024-05-05 MED ORDER — KETOROLAC TROMETHAMINE 30 MG/ML IJ SOLN
INTRAMUSCULAR | Status: DC | PRN
  Administered 2024-05-05: 30 mg via INTRAVENOUS

## 2024-05-05 MED FILL — Bupivacaine HCl Preservative Free (PF) Inj 0.5%: INTRAMUSCULAR | Qty: 30 | Status: AC

## 2024-05-05 SURGICAL SUPPLY — 39 items
CATH FOLEY LF 14FR (CATHETERS) IMPLANT
COVER LIGHT HANDLE STERIS (MISCELLANEOUS) ×2 IMPLANT
COVER MAYO STAND XLG (MISCELLANEOUS) ×1 IMPLANT
COVER TIP SHEARS 8 DVNC (MISCELLANEOUS) ×1 IMPLANT
DERMABOND ADVANCED .7 DNX12 (GAUZE/BANDAGES/DRESSINGS) ×1 IMPLANT
DRAPE ARM DVNC X/XI (DISPOSABLE) ×3 IMPLANT
DRAPE COLUMN DVNC XI (DISPOSABLE) ×1 IMPLANT
DRIVER NDL MEGA SUTCUT DVNCXI (INSTRUMENTS) ×1 IMPLANT
ELECTRODE REM PT RTRN 9FT ADLT (ELECTROSURGICAL) ×1 IMPLANT
FORCEPS BPLR R/ABLATION 8 DVNC (INSTRUMENTS) ×1 IMPLANT
GAUZE SPONGE 4X4 12PLY STRL (GAUZE/BANDAGES/DRESSINGS) ×1 IMPLANT
GLOVE BIOGEL PI IND STRL 7.0 (GLOVE) ×4 IMPLANT
GLOVE SURG SS PI 7.5 STRL IVOR (GLOVE) ×2 IMPLANT
GOWN STRL REUS W/TWL LRG LVL3 (GOWN DISPOSABLE) ×2 IMPLANT
IRRIGATOR SUCT 8 DISP DVNC XI (IRRIGATION / IRRIGATOR) IMPLANT
KIT PINK PAD W/HEAD ARM REST (MISCELLANEOUS) ×1 IMPLANT
KIT TURNOVER KIT A (KITS) ×1 IMPLANT
LIGASURE LAP MARYLAND 5MM 37CM (ELECTROSURGICAL) IMPLANT
MANIFOLD NEPTUNE II (INSTRUMENTS) ×1 IMPLANT
MESH 3DMAX MID 5X7 LT XLRG (Mesh General) IMPLANT
NDL HYPO 21X1.5 SAFETY (NEEDLE) ×1 IMPLANT
NDL INSUFFLATION 14GA 120MM (NEEDLE) ×1 IMPLANT
OBTURATOR OPTICALSTD 8 DVNC (TROCAR) ×1 IMPLANT
PACK LAP CHOLE LZT030E (CUSTOM PROCEDURE TRAY) ×1 IMPLANT
PENCIL HANDSWITCHING (ELECTRODE) ×1 IMPLANT
POSITIONER HEAD 8X9X4 ADT (SOFTGOODS) ×1 IMPLANT
SCISSORS MNPLR CVD DVNC XI (INSTRUMENTS) ×1 IMPLANT
SEAL UNIV 5-12 XI (MISCELLANEOUS) ×3 IMPLANT
SET BASIN LINEN APH (SET/KITS/TRAYS/PACK) ×1 IMPLANT
SET TUBE SMOKE EVAC HIGH FLOW (TUBING) ×1 IMPLANT
SOL PREP POV-IOD 4OZ 10% (MISCELLANEOUS) ×1 IMPLANT
SUT MNCRL AB 4-0 PS2 18 (SUTURE) ×2 IMPLANT
SUT STRATA 3-0 SH (SUTURE) ×1 IMPLANT
SUT VIC AB 2-0 SH 27X BRD (SUTURE) ×1 IMPLANT
SYR 30ML LL (SYRINGE) ×1 IMPLANT
TAPE TRANSPORE STRL 2 31045 (GAUZE/BANDAGES/DRESSINGS) ×1 IMPLANT
TRAY FOL W/BAG SLVR 16FR STRL (SET/KITS/TRAYS/PACK) ×1 IMPLANT
TUBE CONNECTING 12X1/4 (SUCTIONS) ×1 IMPLANT
WATER STERILE IRR 500ML POUR (IV SOLUTION) ×1 IMPLANT

## 2024-05-05 NOTE — Interval H&P Note (Signed)
 History and Physical Interval Note:  05/05/2024 11:45 AM  [name]  has presented today for surgery, with the diagnosis of INGUINAL HERNIA, LEFT.  The various methods of treatment have been discussed with the patient and family. After consideration of risks, benefits and other options for treatment, the patient has consented to  Procedures: REPAIR, HERNIA, INGUINAL, ROBOT-ASSISTED, LAPAROSCOPIC, USING MESH (Left) as a surgical intervention.  The patient's history has been reviewed, patient examined, no change in status, stable for surgery.  I have reviewed the patient's chart and labs.  Questions were answered to the patient's satisfaction.     [name]

## 2024-05-05 NOTE — Anesthesia Procedure Notes (Addendum)
 Procedure Name: Intubation Date/Time: 05/05/2024 12:28 PM  Performed by: Pheobe Adine CROME, CRNAPre-anesthesia Checklist: Patient identified, Emergency Drugs available, Suction available, Patient being monitored and Timeout performed Patient Re-evaluated:Patient Re-evaluated prior to induction Oxygen Delivery Method: Circle system utilized Preoxygenation: Pre-oxygenation with 100% oxygen Induction Type: IV induction Ventilation: Mask ventilation without difficulty and Oral airway inserted - appropriate to patient size Laryngoscope Size: Mac and 4 Grade View: Grade I Tube type: Oral Tube size: 7.5 mm Number of attempts: 1 Airway Equipment and Method: Stylet and Video-laryngoscopy Placement Confirmation: ETT inserted through vocal cords under direct vision, positive ETCO2, CO2 detector and breath sounds checked- equal and bilateral Secured at: 23 cm Tube secured with: Tape Dental Injury: Teeth and Oropharynx as per pre-operative assessment  Comments: Srna dlx1, no view, glide x1

## 2024-05-05 NOTE — Transfer of Care (Signed)
 Immediate Anesthesia Transfer of Care Note  Patient: Koren FORBES Blanc  Procedure(s) Performed: REPAIR, HERNIA, INGUINAL, ROBOT-ASSISTED, LAPAROSCOPIC, USING MESH (Left: Abdomen)  Patient Location: PACU  Anesthesia Type:General  Level of Consciousness: awake, alert , oriented, and patient cooperative  Airway & Oxygen Therapy: Patient Spontanous Breathing and Patient connected to face mask oxygen  Post-op Assessment: Report given to RN and Post -op Vital signs reviewed and stable  Post vital signs: Reviewed and stable  Last Vitals:  Vitals Value Taken Time  BP 130/69   Temp 98.7   Pulse 85 05/05/24 14:26  Resp 13 05/05/24 14:26  SpO2 100 % 05/05/24 14:26  Vitals shown include unfiled device data.  Last Pain:  Vitals:   05/05/24 1011  TempSrc: Oral  PainSc: 4       Patients Stated Pain Goal: 5 (05/05/24 1011)  Complications: No notable events documented.

## 2024-05-05 NOTE — Op Note (Signed)
 Patient:  [name]  DOB:  [birth year]  MRN:  [id number]   Preop Diagnosis: Left inguinal hernia  Postop Diagnosis: Same, direct and indirect  Procedure: Robotic assisted laparoscopic left inguinal herniorrhaphy with mesh  Surgeon: [name], MD  Anes: General endotracheal  Indications: Patient is a 53 year old white male who presents with a symptomatic left inguinal hernia.  The risks and benefits of the procedure including bleeding, infection, mesh use, recurrence of the hernia, and the possibility of an open procedure were fully explained to the patient, who gave informed consent.  Procedure note: The patient was placed in the supine position.  After induction of general endotracheal anesthesia, the abdomen was prepped and draped using the usual sterile technique with Betadine .  Surgical site confirmation was performed.  An incision was made in the left upper quadrant at Palmer's point.  A Veress needle was initially used to insufflate the abdomen.  As the patient had a previous abdominoplasty, the pressures increased quickly.  I placed an 8 mm trocar in the left upper quadrant under direct visualization without difficulty.  I did inspect the bowel and there was no injury.  Additional 8 mm trocars were placed in the right upper quadrant and upper midline regions.  I did have to take down some adhesions in the midline with a LigaSure.  The robot was then docked and targeted.  The patient had a small direct right inguinal hernia, but this was not addressed as the patient was asymptomatic and it was on the small side.  The patient did have both a direct and indirect left inguinal hernia.  The peritoneal flap was then formed down to Cooper's ligament.  This was taken posterior to Cooper's ligament and carried out laterally.  The hernia sacs on both the direct and indirect sides were taken down off the spermatic cord.  An approximately 8 cm posterior dissection was able to be performed.  An  extra-large Bard 3D max mesh was then placed and secured to Cooper's ligament using a 2-0 Vicryl suture.  The peritoneal flap was then closed using a 3-0 strata fix running suture.  Air was then evacuated from the preperitoneal space and good mesh approximation was noted to the abdominal wall.  The robot was undocked and all air was evacuated from the abdominal cavity prior to the removal of the trocars.  All wounds were irrigated with normal saline.  All wounds were injected with 0.5% Sensorcaine .  I additionally injected 0.5% Sensorcaine  in the left inguinal region.  All wounds were closed using a 4-0 Monocryl subcuticular suture.  Dermabond was applied.  All tape and needle counts were correct at the end of the procedure.  The patient was extubated in the operating room and transferred to PACU in stable condition.  Complications: None  EBL: Minimal  Specimen: None

## 2024-05-05 NOTE — Anesthesia Preprocedure Evaluation (Signed)
 Anesthesia Evaluation  Patient identified by MRN, date of birth, ID band Patient awake    Reviewed: Allergy & Precautions, H&P , NPO status , Patient's Chart, lab work & pertinent test results, reviewed documented beta blocker date and time   History of Anesthesia Complications (+) PONV and history of anesthetic complications  Airway Mallampati: II  TM Distance: >3 FB Neck ROM: full    Dental no notable dental hx.    Pulmonary sleep apnea    Pulmonary exam normal breath sounds clear to auscultation       Cardiovascular Exercise Tolerance: Good hypertension, + dysrhythmias  Rhythm:regular Rate:Normal     Neuro/Psych  PSYCHIATRIC DISORDERS Anxiety Depression     Neuromuscular disease CVA    GI/Hepatic Neg liver ROS,GERD  ,,  Endo/Other  diabetes    Renal/GU Renal disease  negative genitourinary   Musculoskeletal   Abdominal   Peds  Hematology  (+) Blood dyscrasia, anemia   Anesthesia Other Findings   Reproductive/Obstetrics negative OB ROS                              Anesthesia Physical Anesthesia Plan  ASA: 3  Anesthesia Plan: General and General ETT   Post-op Pain Management:    Induction:   PONV Risk Score and Plan: Ondansetron   Airway Management Planned:   Additional Equipment:   Intra-op Plan:   Post-operative Plan:   Informed Consent: I have reviewed the patients History and Physical, chart, labs and discussed the procedure including the risks, benefits and alternatives for the proposed anesthesia with the patient or authorized representative who has indicated his/her understanding and acceptance.     Dental Advisory Given  Plan Discussed with: CRNA  Anesthesia Plan Comments:         Anesthesia Quick Evaluation

## 2024-05-08 ENCOUNTER — Encounter (HOSPITAL_COMMUNITY): Payer: Self-pay | Admitting: General Surgery

## 2024-05-08 LAB — GLUCOSE, CAPILLARY: Glucose-Capillary: 158 mg/dL — ABNORMAL HIGH (ref 70–99)

## 2024-05-12 ENCOUNTER — Other Ambulatory Visit: Payer: Self-pay

## 2024-05-12 DIAGNOSIS — D631 Anemia in chronic kidney disease: Secondary | ICD-10-CM | POA: Diagnosis not present

## 2024-05-12 DIAGNOSIS — N1831 Chronic kidney disease, stage 3a: Secondary | ICD-10-CM | POA: Diagnosis not present

## 2024-05-12 DIAGNOSIS — E1121 Type 2 diabetes mellitus with diabetic nephropathy: Secondary | ICD-10-CM | POA: Diagnosis not present

## 2024-05-12 DIAGNOSIS — N1832 Chronic kidney disease, stage 3b: Secondary | ICD-10-CM | POA: Diagnosis not present

## 2024-05-12 DIAGNOSIS — M51369 Other intervertebral disc degeneration, lumbar region without mention of lumbar back pain or lower extremity pain: Secondary | ICD-10-CM | POA: Diagnosis not present

## 2024-05-12 DIAGNOSIS — Z6837 Body mass index (BMI) 37.0-37.9, adult: Secondary | ICD-10-CM | POA: Diagnosis not present

## 2024-05-12 DIAGNOSIS — E785 Hyperlipidemia, unspecified: Secondary | ICD-10-CM | POA: Diagnosis not present

## 2024-05-12 DIAGNOSIS — Z79899 Other long term (current) drug therapy: Secondary | ICD-10-CM | POA: Diagnosis not present

## 2024-05-12 DIAGNOSIS — E6609 Other obesity due to excess calories: Secondary | ICD-10-CM | POA: Diagnosis not present

## 2024-05-12 NOTE — Anesthesia Postprocedure Evaluation (Signed)
"   Anesthesia Post Note  Patient: [name]  Procedure(s) Performed: REPAIR, HERNIA, INGUINAL, ROBOT-ASSISTED, LAPAROSCOPIC, USING MESH (Left: Abdomen)  Patient location during evaluation: Phase II Anesthesia Type: General Level of consciousness: awake Pain management: pain level controlled Vital Signs Assessment: post-procedure vital signs reviewed and stable Respiratory status: spontaneous breathing and respiratory function stable Cardiovascular status: blood pressure returned to baseline and stable Postop Assessment: no headache and no apparent nausea or vomiting Anesthetic complications: no Comments: Late entry   No notable events documented.   Last Vitals:  Vitals:   05/05/24 1500 05/05/24 1507  BP: 100/78 131/62  Pulse: 80 81  Resp: 11 12  Temp:  36.9 C  SpO2: 100% 100%    Last Pain:  Vitals:   05/08/24 1453  TempSrc:   PainSc: 0-No pain                 Yvonna PARAS Chana Lindstrom      "

## 2024-05-16 ENCOUNTER — Ambulatory Visit: Admitting: General Surgery

## 2024-05-16 ENCOUNTER — Other Ambulatory Visit: Payer: Self-pay

## 2024-05-16 ENCOUNTER — Encounter: Payer: Self-pay | Admitting: General Surgery

## 2024-05-16 VITALS — BP 108/71 | HR 80 | Temp 98.0°F | Resp 18 | Ht 69.0 in | Wt 256.0 lb

## 2024-05-16 DIAGNOSIS — Z09 Encounter for follow-up examination after completed treatment for conditions other than malignant neoplasm: Secondary | ICD-10-CM

## 2024-05-16 DIAGNOSIS — K4090 Unilateral inguinal hernia, without obstruction or gangrene, not specified as recurrent: Secondary | ICD-10-CM

## 2024-05-16 NOTE — Progress Notes (Signed)
 Patient's chart reviewed.  Agree with assessment from CNA.  Follow-up as needed.

## 2024-05-16 NOTE — Progress Notes (Signed)
 Surgical Date: 05/05/2024 Procedure: XI ROBOTIC ASSISTED LAPAROSCOPIC INGUINAL HERNIA REPAIR W/ MESH  Patient seen in office asa a nurse visit for post op.   Incisions healing well. Patient reports no concerns.   Noted slight swelling/ discoloration to lower left side of groin. Patient reports that swelling has improved greatly and is mostly resolved.   Advised to follow up as needed.

## 2024-05-16 NOTE — Addendum Note (Signed)
 Addended by: MAVIS ANES A on: 05/16/2024 12:11 PM   Modules accepted: Level of Service

## 2024-05-19 DIAGNOSIS — E1122 Type 2 diabetes mellitus with diabetic chronic kidney disease: Secondary | ICD-10-CM | POA: Diagnosis not present

## 2024-05-19 DIAGNOSIS — G4733 Obstructive sleep apnea (adult) (pediatric): Secondary | ICD-10-CM | POA: Diagnosis not present

## 2024-05-19 DIAGNOSIS — N1832 Chronic kidney disease, stage 3b: Secondary | ICD-10-CM | POA: Diagnosis not present

## 2024-05-19 DIAGNOSIS — I5032 Chronic diastolic (congestive) heart failure: Secondary | ICD-10-CM | POA: Diagnosis not present

## 2024-05-26 ENCOUNTER — Other Ambulatory Visit: Payer: Self-pay

## 2024-05-26 DIAGNOSIS — F419 Anxiety disorder, unspecified: Secondary | ICD-10-CM

## 2024-05-29 DIAGNOSIS — G4700 Insomnia, unspecified: Secondary | ICD-10-CM

## 2024-06-02 ENCOUNTER — Telehealth: Payer: Self-pay | Admitting: Gastroenterology

## 2024-06-02 NOTE — Telephone Encounter (Signed)
 Good Afternoon, courteous notification of patient rescheduling Genetic Counseling Referral appointment from 06/05/2024 to 07/13/2024 9:00am. He had to reschedule due to other conflicting appts. If you feel he needs a sooner appt, please call us  at [phone]. Thank You

## 2024-06-05 ENCOUNTER — Inpatient Hospital Stay: Admitting: Genetic Counselor

## 2024-06-05 ENCOUNTER — Other Ambulatory Visit: Payer: Self-pay

## 2024-06-05 ENCOUNTER — Inpatient Hospital Stay

## 2024-06-25 ENCOUNTER — Other Ambulatory Visit: Payer: Self-pay

## 2024-06-25 DIAGNOSIS — E1121 Type 2 diabetes mellitus with diabetic nephropathy: Secondary | ICD-10-CM

## 2024-06-26 ENCOUNTER — Other Ambulatory Visit: Payer: Self-pay

## 2024-06-26 DIAGNOSIS — G4700 Insomnia, unspecified: Secondary | ICD-10-CM

## 2024-07-13 ENCOUNTER — Inpatient Hospital Stay: Admitting: Genetic Counselor
